# Patient Record
Sex: Female | Born: 1955 | Race: Black or African American | Hispanic: No | Marital: Married | Smoking: Former smoker
Health system: Southern US, Community
[De-identification: ages and names within clinical notes are randomized; demographics above are authoritative.]

## PROBLEM LIST (undated history)

## (undated) DIAGNOSIS — I2510 Atherosclerotic heart disease of native coronary artery without angina pectoris: Secondary | ICD-10-CM

## (undated) DIAGNOSIS — J45909 Unspecified asthma, uncomplicated: Secondary | ICD-10-CM

## (undated) DIAGNOSIS — I10 Essential (primary) hypertension: Secondary | ICD-10-CM

## (undated) DIAGNOSIS — J449 Chronic obstructive pulmonary disease, unspecified: Secondary | ICD-10-CM

## (undated) DIAGNOSIS — I6350 Cerebral infarction due to unspecified occlusion or stenosis of unspecified cerebral artery: Secondary | ICD-10-CM

## (undated) DIAGNOSIS — M1990 Unspecified osteoarthritis, unspecified site: Secondary | ICD-10-CM

## (undated) DIAGNOSIS — E119 Type 2 diabetes mellitus without complications: Secondary | ICD-10-CM

## (undated) DIAGNOSIS — N183 Chronic kidney disease, stage 3 (moderate): Secondary | ICD-10-CM

## (undated) DIAGNOSIS — I639 Cerebral infarction, unspecified: Secondary | ICD-10-CM

## (undated) DIAGNOSIS — I219 Acute myocardial infarction, unspecified: Secondary | ICD-10-CM

## (undated) DIAGNOSIS — F329 Major depressive disorder, single episode, unspecified: Secondary | ICD-10-CM

## (undated) DIAGNOSIS — E7800 Pure hypercholesterolemia, unspecified: Secondary | ICD-10-CM

## (undated) DIAGNOSIS — IMO0001 Reserved for inherently not codable concepts without codable children: Secondary | ICD-10-CM

## (undated) DIAGNOSIS — I509 Heart failure, unspecified: Secondary | ICD-10-CM

## (undated) HISTORY — PX: CARDIAC SURGERY: SHX584

## (undated) HISTORY — PX: FOOT SURGERY: SHX648

## (undated) HISTORY — DX: Chronic obstructive pulmonary disease, unspecified: J44.9

## (undated) HISTORY — DX: Type 2 diabetes mellitus without complications: E11.9

## (undated) HISTORY — DX: Atherosclerotic heart disease of native coronary artery without angina pectoris: I25.10

## (undated) HISTORY — DX: Essential (primary) hypertension: I10

## (undated) HISTORY — DX: Unspecified osteoarthritis, unspecified site: M19.90

## (undated) HISTORY — DX: Cerebral infarction due to unspecified occlusion or stenosis of unspecified cerebral artery: I63.50

## (undated) HISTORY — DX: Unspecified asthma, uncomplicated: J45.909

---

## 1997-07-28 ENCOUNTER — Inpatient Hospital Stay (HOSPITAL_COMMUNITY): Admission: EM | Admit: 1997-07-28 | Discharge: 1997-07-31 | Payer: Self-pay | Admitting: Emergency Medicine

## 1997-08-06 ENCOUNTER — Encounter: Admission: RE | Admit: 1997-08-06 | Discharge: 1997-08-06 | Payer: Self-pay | Admitting: Family Medicine

## 2006-03-06 DIAGNOSIS — I669 Occlusion and stenosis of unspecified cerebral artery: Secondary | ICD-10-CM | POA: Insufficient documentation

## 2006-03-06 HISTORY — DX: Occlusion and stenosis of unspecified cerebral artery: I66.9

## 2007-03-11 ENCOUNTER — Encounter: Payer: Self-pay | Admitting: Cardiovascular Disease

## 2007-03-12 ENCOUNTER — Other Ambulatory Visit: Payer: Self-pay | Admitting: Cardiology

## 2007-03-19 DIAGNOSIS — I252 Old myocardial infarction: Secondary | ICD-10-CM

## 2007-03-19 DIAGNOSIS — I2510 Atherosclerotic heart disease of native coronary artery without angina pectoris: Secondary | ICD-10-CM | POA: Insufficient documentation

## 2007-03-19 DIAGNOSIS — IMO0001 Reserved for inherently not codable concepts without codable children: Secondary | ICD-10-CM | POA: Insufficient documentation

## 2007-03-19 DIAGNOSIS — L0291 Cutaneous abscess, unspecified: Secondary | ICD-10-CM | POA: Insufficient documentation

## 2007-03-19 DIAGNOSIS — L0390 Cellulitis, unspecified: Secondary | ICD-10-CM | POA: Insufficient documentation

## 2007-03-19 DIAGNOSIS — I10 Essential (primary) hypertension: Secondary | ICD-10-CM | POA: Insufficient documentation

## 2007-03-19 DIAGNOSIS — J45909 Unspecified asthma, uncomplicated: Secondary | ICD-10-CM | POA: Insufficient documentation

## 2007-03-19 HISTORY — DX: Old myocardial infarction: I25.2

## 2008-11-24 ENCOUNTER — Encounter: Payer: Self-pay | Admitting: General Practice

## 2008-12-16 ENCOUNTER — Inpatient Hospital Stay
Admit: 2008-12-16 | Disposition: A | Discharge status: Home / Self Care | Payer: Self-pay | Source: Ambulatory Visit | Attending: Primary Care | Admitting: Primary Care

## 2008-12-16 ENCOUNTER — Encounter: Payer: Self-pay | Admitting: Cardiology

## 2008-12-30 DIAGNOSIS — J449 Chronic obstructive pulmonary disease, unspecified: Secondary | ICD-10-CM | POA: Insufficient documentation

## 2009-01-01 ENCOUNTER — Ambulatory Visit
Admit: 2009-01-01 | Discharge: 2009-01-01 | Disposition: A | Discharge status: Home / Self Care | Payer: Self-pay | Source: Ambulatory Visit | Attending: Cardiology | Admitting: Cardiology

## 2009-01-01 ENCOUNTER — Ambulatory Visit
Admit: 2009-01-01 | Discharge: 2009-01-01 | Disposition: A | Discharge status: Home / Self Care | Payer: Self-pay | Source: Ambulatory Visit | Admitting: Cardiology

## 2009-01-11 ENCOUNTER — Ambulatory Visit: Payer: Self-pay | Admitting: Internal Medicine

## 2009-01-18 ENCOUNTER — Emergency Department
Admit: 2009-01-18 | Disposition: A | Discharge status: Other Acute Inpatient Hospital | Payer: Self-pay | Source: Ambulatory Visit

## 2009-01-18 ENCOUNTER — Inpatient Hospital Stay
Admit: 2009-01-18 | Disposition: A | Discharge status: Home / Self Care | Payer: Self-pay | Source: Other Acute Inpatient Hospital | Attending: Internal Medicine | Admitting: Internal Medicine

## 2009-01-18 ENCOUNTER — Encounter: Payer: Self-pay | Admitting: Cardiology

## 2009-01-18 ENCOUNTER — Other Ambulatory Visit: Payer: Self-pay | Admitting: Gastroenterology

## 2009-01-18 LAB — CBC AND DIFFERENTIAL
Baso # K/uL: 0 THOU/uL (ref 0.0–0.1)
Basophil %: 0.1 % (ref 0.1–1.2)
Eos # K/uL: 0.1 THOU/uL (ref 0.0–0.4)
Eosinophil %: 1.5 % (ref 0.7–5.8)
Hematocrit: 33 % — ABNORMAL LOW (ref 34–45)
Hemoglobin: 10.5 g/dL — ABNORMAL LOW (ref 11.2–15.7)
Lymph # K/uL: 2.5 THOU/uL (ref 1.2–3.7)
Lymphocyte %: 35.9 % (ref 19.3–51.7)
MCV: 81 fL (ref 79–95)
Mono # K/uL: 0.6 THOU/uL (ref 0.2–0.9)
Monocyte %: 8.3 % (ref 4.7–12.5)
Neut # K/uL: 3.7 THOU/uL (ref 1.6–6.1)
Platelets: 236 THOU/uL (ref 160–370)
RBC: 4.1 MIL/uL (ref 3.9–5.2)
RDW: 17.9 % — ABNORMAL HIGH (ref 11.7–14.4)
Seg Neut %: 54.2 % (ref 34.0–71.1)
WBC: 6.9 THOU/uL (ref 4.0–10.0)

## 2009-01-18 LAB — PLASMA PROF 7 (ED ONLY)
Anion Gap,PL: 12 (ref 7–16)
CO2,Plasma: 23 mmol/L (ref 20–28)
Chloride,Plasma: 106 mmol/L (ref 96–108)
Creatinine: 1.19 mg/dL — ABNORMAL HIGH (ref 0.51–0.95)
GFR,Black: 58 * — AB
GFR,Caucasian: 48 * — AB
Glucose,Plasma: 190 mg/dL — ABNORMAL HIGH (ref 74–106)
Potassium,Plasma: 3.4 mmol/L (ref 3.4–4.7)
Sodium,Plasma: 141 mmol/L (ref 132–146)
UN,Plasma: 18 mg/dL (ref 6–20)

## 2009-01-18 LAB — HOLD RED: Hold Red: 1

## 2009-01-18 LAB — RUQ PANEL (ED ONLY)
ALT: 11 U/L (ref 0–35)
AST: 12 U/L (ref 0–35)
Albumin: 4 g/dL (ref 3.5–5.2)
Alk Phos: 80 U/L (ref 35–105)
Amylase: 24 U/L — ABNORMAL LOW (ref 28–100)
Bilirubin,Direct: <0.2 mg/dL (ref 0.0–0.3)
Bilirubin,Total: 0.1 mg/dL (ref 0.0–1.2)
Lipase: 48 U/L (ref 13–60)
Total Protein: 6.7 g/dL (ref 6.3–7.7)

## 2009-01-18 LAB — NT-PRO BNP: NT-pro BNP: 18 pg/mL (ref 0–900)

## 2009-01-18 LAB — HOLD GRAY

## 2009-01-18 LAB — TROPONIN T: Troponin T: <0.01 ng/mL (ref 0.00–0.02)

## 2009-01-18 LAB — HOLD BLUE

## 2009-01-19 LAB — BASIC METABOLIC PANEL
Anion Gap: 17 — ABNORMAL HIGH (ref 7–16)
CO2: 23 mmol/L (ref 20–28)
Calcium: 8.7 mg/dL (ref 8.6–10.2)
Chloride: 99 mmol/L (ref 96–108)
Creatinine: 1.21 mg/dL — ABNORMAL HIGH (ref 0.51–0.95)
GFR,Black: 57 * — AB
GFR,Caucasian: 47 * — AB
Glucose: 452 mg/dL — ABNORMAL HIGH (ref 74–106)
Lab: 21 mg/dL — ABNORMAL HIGH (ref 6–20)
Potassium: 4.8 mmol/L (ref 3.3–5.1)
Sodium: 139 mmol/L (ref 133–145)

## 2009-01-19 LAB — CBC AND DIFFERENTIAL
Baso # K/uL: 0 THOU/uL (ref 0.0–0.1)
Basophil %: 0.2 % (ref 0.1–1.2)
Eos # K/uL: 0 THOU/uL (ref 0.0–0.4)
Eosinophil %: 0 % — ABNORMAL LOW (ref 0.7–5.8)
Hematocrit: 32 % — ABNORMAL LOW (ref 34–45)
Hemoglobin: 10.1 g/dL — ABNORMAL LOW (ref 11.2–15.7)
Lymph # K/uL: 1.2 THOU/uL (ref 1.2–3.7)
Lymphocyte %: 19.4 % (ref 19.3–51.7)
MCV: 80 fL (ref 79–95)
Mono # K/uL: 0.1 THOU/uL — ABNORMAL LOW (ref 0.2–0.9)
Monocyte %: 1.5 % — ABNORMAL LOW (ref 4.7–12.5)
Neut # K/uL: 4.8 THOU/uL (ref 1.6–6.1)
Platelets: 207 THOU/uL (ref 160–370)
RBC: 4 MIL/uL (ref 3.9–5.2)
RDW: 17.7 % — ABNORMAL HIGH (ref 11.7–14.4)
Seg Neut %: 78.4 % — ABNORMAL HIGH (ref 34.0–71.1)
WBC: 6.1 THOU/uL (ref 4.0–10.0)

## 2009-01-19 LAB — URINALYSIS WITH MICROSCOPIC
Blood,UA: NEGATIVE
Glucose,UA: 1000 mg/dL — AB
Ketones, UA: NEGATIVE
Leuk Esterase,UA: NEGATIVE
Nitrite,UA: NEGATIVE
Protein,UA: NEGATIVE mg/dL
RBC,UA: NONE SEEN /HPF (ref 0–2)
Specific Gravity,UA: 1.027 (ref 1.001–1.030)
WBC,UA: NONE SEEN /HPF (ref 0–5)
pH,UA: 5 (ref 5.0–8.0)

## 2009-01-19 LAB — POCT GLUCOSE
Glucose POCT: >500 mg/dL — ABNORMAL HIGH (ref 74–106)
Glucose POCT: 500 mg/dL — ABNORMAL HIGH (ref 74–106)

## 2009-01-19 LAB — MRSA (ORSA) AMPLIFICATION

## 2009-01-20 LAB — POCT GLUCOSE
Glucose POCT: >500 mg/dL — ABNORMAL HIGH (ref 74–106)
Glucose POCT: >500 mg/dL — ABNORMAL HIGH (ref 74–106)
Glucose POCT: 494 mg/dL — ABNORMAL HIGH (ref 74–106)
Glucose POCT: >500 mg/dL — ABNORMAL HIGH (ref 74–106)
Glucose POCT: >500 mg/dL — ABNORMAL HIGH (ref 74–106)
Glucose POCT: 424 mg/dL — ABNORMAL HIGH (ref 74–106)
Glucose POCT: 359 mg/dL — ABNORMAL HIGH (ref 74–106)
Glucose POCT: 427 mg/dL — ABNORMAL HIGH (ref 74–106)
Glucose POCT: 456 mg/dL — ABNORMAL HIGH (ref 74–106)

## 2009-01-20 LAB — BASIC METABOLIC PANEL
Anion Gap: 13 (ref 7–16)
CO2: 24 mmol/L (ref 20–28)
Calcium: 9.2 mg/dL (ref 8.6–10.2)
Chloride: 101 mmol/L (ref 96–108)
Creatinine: 1.13 mg/dL — ABNORMAL HIGH (ref 0.51–0.95)
GFR,Black: >59 *
GFR,Caucasian: 51 * — AB
Glucose: 352 mg/dL — ABNORMAL HIGH (ref 74–106)
Lab: 26 mg/dL — ABNORMAL HIGH (ref 6–20)
Potassium: 4.7 mmol/L (ref 3.3–5.1)
Sodium: 138 mmol/L (ref 133–145)

## 2009-01-20 LAB — CBC AND DIFFERENTIAL
Baso # K/uL: 0 THOU/uL (ref 0.0–0.1)
Basophil %: 0.1 % (ref 0.1–1.2)
Eos # K/uL: 0 THOU/uL (ref 0.0–0.4)
Eosinophil %: 0 % — ABNORMAL LOW (ref 0.7–5.8)
Hematocrit: 32 % — ABNORMAL LOW (ref 34–45)
Hemoglobin: 10 g/dL — ABNORMAL LOW (ref 11.2–15.7)
Lymph # K/uL: 1.5 THOU/uL (ref 1.2–3.7)
Lymphocyte %: 13.9 % — ABNORMAL LOW (ref 19.3–51.7)
MCV: 79 fL (ref 79–95)
Mono # K/uL: 0.7 THOU/uL (ref 0.2–0.9)
Monocyte %: 6.7 % (ref 4.7–12.5)
Neut # K/uL: 8.5 THOU/uL — ABNORMAL HIGH (ref 1.6–6.1)
Platelets: 213 THOU/uL (ref 160–370)
RBC: 4 MIL/uL (ref 3.9–5.2)
RDW: 17.8 % — ABNORMAL HIGH (ref 11.7–14.4)
Seg Neut %: 78.8 % — ABNORMAL HIGH (ref 34.0–71.1)
WBC: 10.8 THOU/uL — ABNORMAL HIGH (ref 4.0–10.0)

## 2009-01-21 LAB — EKG 12-LEAD
P: 59 degrees
PR: 176 ms
QRS: 13 degrees
QRSD: 84 ms
QT: 404 ms
QTc: 472 ms
Rate: 82 {beats}/min
Severity: NORMAL
T: 54 degrees

## 2009-01-21 LAB — POCT GLUCOSE
Glucose POCT: 470 mg/dL — ABNORMAL HIGH (ref 74–106)
Glucose POCT: 230 mg/dL — ABNORMAL HIGH (ref 74–106)
Glucose POCT: 224 mg/dL — ABNORMAL HIGH (ref 74–106)
Glucose POCT: 250 mg/dL — ABNORMAL HIGH (ref 74–106)
Glucose POCT: 481 mg/dL — ABNORMAL HIGH (ref 74–106)
Glucose POCT: 431 mg/dL — ABNORMAL HIGH (ref 74–106)

## 2009-01-22 LAB — POCT GLUCOSE
Glucose POCT: 300 mg/dL — ABNORMAL HIGH (ref 74–106)
Glucose POCT: 275 mg/dL — ABNORMAL HIGH (ref 74–106)
Glucose POCT: 465 mg/dL — ABNORMAL HIGH (ref 74–106)
Glucose POCT: 454 mg/dL — ABNORMAL HIGH (ref 74–106)
Glucose POCT: 320 mg/dL — ABNORMAL HIGH (ref 74–106)
Glucose POCT: 292 mg/dL — ABNORMAL HIGH (ref 74–106)
Glucose POCT: 266 mg/dL — ABNORMAL HIGH (ref 74–106)
Glucose POCT: 229 mg/dL — ABNORMAL HIGH (ref 74–106)
Glucose POCT: 116 mg/dL — ABNORMAL HIGH (ref 74–106)
Glucose POCT: 159 mg/dL — ABNORMAL HIGH (ref 74–106)
Glucose POCT: 306 mg/dL — ABNORMAL HIGH (ref 74–106)

## 2009-01-23 LAB — POCT GLUCOSE: Glucose POCT: 185 mg/dL — ABNORMAL HIGH (ref 74–106)

## 2009-01-24 LAB — POCT GLUCOSE
Glucose POCT: 349 mg/dL — ABNORMAL HIGH (ref 74–106)
Glucose POCT: 259 mg/dL — ABNORMAL HIGH (ref 74–106)
Glucose POCT: 232 mg/dL — ABNORMAL HIGH (ref 74–106)
Glucose POCT: 312 mg/dL — ABNORMAL HIGH (ref 74–106)
Glucose POCT: 283 mg/dL — ABNORMAL HIGH (ref 74–106)
Glucose POCT: 224 mg/dL — ABNORMAL HIGH (ref 74–106)
Glucose POCT: 161 mg/dL — ABNORMAL HIGH (ref 74–106)

## 2009-01-24 LAB — COMPREHENSIVE METABOLIC PANEL
ALT: 15 U/L (ref 0–35)
AST: 14 U/L (ref 0–35)
Albumin: 4.2 g/dL (ref 3.5–5.2)
Alk Phos: 67 U/L (ref 35–105)
Anion Gap: 12 (ref 7–16)
Bilirubin,Total: 0.2 mg/dL (ref 0.0–1.2)
CO2: 29 mmol/L — ABNORMAL HIGH (ref 20–28)
Calcium: 9.5 mg/dL (ref 8.6–10.2)
Chloride: 100 mmol/L (ref 96–108)
Creatinine: 1.12 mg/dL — ABNORMAL HIGH (ref 0.51–0.95)
GFR,Black: >59 *
GFR,Caucasian: 51 * — AB
Globulin: 2.7 g/dL (ref 2.7–4.3)
Glucose: 52 mg/dL — ABNORMAL LOW (ref 74–106)
Lab: 21 mg/dL — ABNORMAL HIGH (ref 6–20)
Potassium: 4 mmol/L (ref 3.3–5.1)
Sodium: 141 mmol/L (ref 133–145)
Total Protein: 6.9 g/dL (ref 6.3–7.7)

## 2009-01-24 LAB — CBC AND DIFFERENTIAL
Baso # K/uL: 0.2 THOU/uL — ABNORMAL HIGH (ref 0.0–0.1)
Basophil %: 1 % (ref 0.1–1.2)
Eos # K/uL: 0 THOU/uL (ref 0.0–0.4)
Eosinophil %: 0 % — ABNORMAL LOW (ref 0.7–5.8)
Hematocrit: 34 % (ref 34–45)
Hemoglobin: 10.6 g/dL — ABNORMAL LOW (ref 11.2–15.7)
Lymph # K/uL: 7.5 THOU/uL — ABNORMAL HIGH (ref 1.2–3.7)
Lymphocyte %: 48 % (ref 19.3–51.7)
MCV: 80 fL (ref 79–95)
Mono # K/uL: 1.2 THOU/uL — ABNORMAL HIGH (ref 0.2–0.9)
Monocyte %: 8 % (ref 4.7–12.5)
Neut # K/uL: 6.5 THOU/uL — ABNORMAL HIGH (ref 1.6–6.1)
Nucl RBC %: 1 /100{WBCs} — ABNORMAL HIGH (ref 0.0–0.2)
Platelets: 252 THOU/uL (ref 160–370)
RBC: 4.3 MIL/uL (ref 3.9–5.2)
RDW: 18.9 % — ABNORMAL HIGH (ref 11.7–14.4)
Seg Neut %: 40 % (ref 34.0–71.1)
WBC: 15.5 THOU/uL — ABNORMAL HIGH (ref 4.0–10.0)

## 2009-01-24 LAB — BLOOD CULTURE: Bacterial Blood Culture: NO GROWTH

## 2009-01-24 LAB — BANDS: Bands %: 2 % (ref 0–10)

## 2009-01-24 LAB — SLIDE NUMBER: Slide # (Heme): 2131

## 2009-01-24 LAB — MAGNESIUM: Magnesium: 1.6 meq/L (ref 1.3–2.1)

## 2009-01-24 LAB — DIFF BASED ON: Diff Based On: 100 CELLS

## 2009-01-24 LAB — METAMYELOCYTE: Metamyelocyte %: 1 % (ref 0–1)

## 2009-01-24 LAB — POLYCHROMASIA

## 2009-01-24 LAB — MANUAL DIFFERENTIAL

## 2009-01-25 LAB — POCT GLUCOSE
Glucose POCT: 76 mg/dL (ref 74–106)
Glucose POCT: 145 mg/dL — ABNORMAL HIGH (ref 74–106)
Glucose POCT: 355 mg/dL — ABNORMAL HIGH (ref 74–106)
Glucose POCT: 188 mg/dL — ABNORMAL HIGH (ref 74–106)
Glucose POCT: 243 mg/dL — ABNORMAL HIGH (ref 74–106)
Glucose POCT: 226 mg/dL — ABNORMAL HIGH (ref 74–106)

## 2009-01-26 LAB — CBC AND DIFFERENTIAL
Baso # K/uL: 0 THOU/uL (ref 0.0–0.1)
Basophil %: 0.2 % (ref 0.1–1.2)
Eos # K/uL: 0 THOU/uL (ref 0.0–0.4)
Eosinophil %: 0 % — ABNORMAL LOW (ref 0.7–5.8)
Hematocrit: 32 % — ABNORMAL LOW (ref 34–45)
Hemoglobin: 9.9 g/dL — ABNORMAL LOW (ref 11.2–15.7)
Lymph # K/uL: 2 THOU/uL (ref 1.2–3.7)
Lymphocyte %: 19 % — ABNORMAL LOW (ref 19.3–51.7)
MCV: 81 fL (ref 79–95)
Mono # K/uL: 0.7 THOU/uL (ref 0.2–0.9)
Monocyte %: 6.1 % (ref 4.7–12.5)
Neut # K/uL: 7.7 THOU/uL — ABNORMAL HIGH (ref 1.6–6.1)
Platelets: 246 THOU/uL (ref 160–370)
RBC: 4 MIL/uL (ref 3.9–5.2)
RDW: 19.2 % — ABNORMAL HIGH (ref 11.7–14.4)
Seg Neut %: 72.3 % — ABNORMAL HIGH (ref 34.0–71.1)
WBC: 10.7 THOU/uL — ABNORMAL HIGH (ref 4.0–10.0)

## 2009-01-26 LAB — BASIC METABOLIC PANEL
Anion Gap: 11 (ref 7–16)
CO2: 28 mmol/L (ref 20–28)
Calcium: 9.3 mg/dL (ref 8.6–10.2)
Chloride: 95 mmol/L — ABNORMAL LOW (ref 96–108)
Creatinine: 1.36 mg/dL — ABNORMAL HIGH (ref 0.51–0.95)
GFR,Black: 49 * — AB
GFR,Caucasian: 41 * — AB
Glucose: 437 mg/dL — ABNORMAL HIGH (ref 74–106)
Lab: 30 mg/dL — ABNORMAL HIGH (ref 6–20)
Potassium: 5.6 mmol/L — ABNORMAL HIGH (ref 3.3–5.1)
Sodium: 134 mmol/L (ref 133–145)

## 2009-01-26 LAB — POCT GLUCOSE
Glucose POCT: 196 mg/dL — ABNORMAL HIGH (ref 74–106)
Glucose POCT: 280 mg/dL — ABNORMAL HIGH (ref 74–106)
Glucose POCT: 163 mg/dL — ABNORMAL HIGH (ref 74–106)
Glucose POCT: >500 mg/dL — ABNORMAL HIGH (ref 74–106)
Glucose POCT: >500 mg/dL — ABNORMAL HIGH (ref 74–106)
Glucose POCT: >500 mg/dL — ABNORMAL HIGH (ref 74–106)
Glucose POCT: 262 mg/dL — ABNORMAL HIGH (ref 74–106)
Glucose POCT: 145 mg/dL — ABNORMAL HIGH (ref 74–106)

## 2009-01-27 LAB — POCT GLUCOSE
Glucose POCT: 365 mg/dL — ABNORMAL HIGH (ref 74–106)
Glucose POCT: 375 mg/dL — ABNORMAL HIGH (ref 74–106)

## 2009-01-27 LAB — BASIC METABOLIC PANEL
Anion Gap: 8 (ref 7–16)
CO2: 32 mmol/L — ABNORMAL HIGH (ref 20–28)
Calcium: 9 mg/dL (ref 8.6–10.2)
Chloride: 99 mmol/L (ref 96–108)
Creatinine: 1.13 mg/dL — ABNORMAL HIGH (ref 0.51–0.95)
GFR,Black: >59 *
GFR,Caucasian: 51 * — AB
Glucose: 128 mg/dL — ABNORMAL HIGH (ref 74–106)
Lab: 23 mg/dL — ABNORMAL HIGH (ref 6–20)
Potassium: 4.1 mmol/L (ref 3.3–5.1)
Sodium: 139 mmol/L (ref 133–145)

## 2009-01-28 LAB — POCT GLUCOSE
Glucose POCT: 157 mg/dL — ABNORMAL HIGH (ref 74–106)
Glucose POCT: 212 mg/dL — ABNORMAL HIGH (ref 74–106)
Glucose POCT: 48 mg/dL — ABNORMAL LOW (ref 74–106)
Glucose POCT: 75 mg/dL (ref 74–106)
Glucose POCT: 159 mg/dL — ABNORMAL HIGH (ref 74–106)

## 2009-02-01 NOTE — Discharge Summary (Addendum)
INTERIM DISCHARGE SUMMARY:  Covering dates 01/23/2009 through 01/27/2009.      HOSPITAL COURSE:  The patient continued to have difficulty with wheezing  and was placed on Medrol taper with gradual improvement in her respiratory  status.  She was also found to have an elevated potassium level on  01/26/2009.  She was given Kayexalate x1 and lisinopril was discontinued.  On the morning of 01/27/2009, her potassium level was down to 4.1.  The  patient had difficulty with constipation and has been placed on a bowel  regimen.  She has completed her course of antibiotics.  Blood sugars are  under better control on her current regimen of Lantus 60 units subcu b.i.d.  and NovoLog 40 units subcu after meals.  The patient has been given a  requisition to have her blood work done, a CBC with diff, and SMA-8 on  02/03/2009 with the results to Dr. Lajuana Carry, her primary care physician for  review.  The patient has been instructed to check her blood sugars at  mealtimes and at bedtime and record for Dr. Lajuana Carry to review at her  followup.  The patient was noted to be noncompliant with her dietary  restrictions, frequently finding Snickers bars in her room that were being  provided by her significant other.  The patient continues to complain about  the use of Thick-it for her liquids.  She will need to thicken her liquids  at home to a honey consistency.  It appears that she has only been  intermittent compliant with this at home.  Risk of aspiration was discussed  with the patient.    DISCHARGE MEDICATIONS:     1. Amlodipine 10 mg p.o. daily.     2. Aspirin 325 p.o. daily.     3. Colace 200 mg p.o. b.i.d.     4. Combivent meter-dose inhaler 2 puffs q.i.d.     5. Cymbalta 60 mg p.o. daily.     6. NovoLog 40 units subcu after meals.     7. Lantus 60 units subcu b.i.d.     8. Lipitor 40 mg p.o. daily.     9. Medrol 16 mg p.o. daily x2 days, then 12 mg p.o. daily x2 days, then     8 mg p.o. daily x2 days, then 4 mg p.o. daily x2 days,  then discontinue.       10. Mucinex 600 mg p.o. b.i.d.     11. Lyrica 75 mg p.o. b.i.d.     12. Prilosec 20 mg p.o. daily.     13. Senna 2 tabs p.o. b.i.d.     14. Trazodone 200 mg p.o. q.h.s.     15. Triamterene/hydrochlorothiazide 1 tab p.o. daily.     16. Tylenol p.r.n.     17. Albuterol 2 puffs q.4h. p.r.n. shortness of breath or wheezing.     18. Robitussin p.r.n.    FOLLOWUP:  Follow up with Dr. Lajuana Carry within 1 week.          Dictated by:  Mortimer Fries, PA  Electronically Signed and Finalized by  Minta Balsam, MD 02/01/2009 14:49  ___________________________________________  Minta Balsam, MD  DD:  01/27/2009  DT:  01/27/2009  7:35 P  DVI: [id number]  [id number]    cc:  Lyman Bishop, MD

## 2009-02-03 ENCOUNTER — Institutional Professional Consult (permissible substitution): Payer: Self-pay | Admitting: Sleep Medicine

## 2009-02-04 ENCOUNTER — Ambulatory Visit: Payer: Self-pay

## 2009-02-05 ENCOUNTER — Ambulatory Visit
Admit: 2009-02-05 | Discharge: 2009-02-05 | Disposition: A | Discharge status: Home / Self Care | Payer: Self-pay | Source: Ambulatory Visit | Attending: Cardiology | Admitting: Cardiology

## 2009-02-05 ENCOUNTER — Inpatient Hospital Stay: Admit: 2009-02-05 | Discharge: 2009-02-05 | Disposition: A | Discharge status: Home / Self Care | Payer: Self-pay

## 2009-02-05 ENCOUNTER — Ambulatory Visit: Payer: Self-pay | Admitting: Cardiology

## 2009-02-09 ENCOUNTER — Ambulatory Visit
Admit: 2009-02-09 | Discharge: 2009-02-09 | Disposition: A | Discharge status: Home / Self Care | Payer: Self-pay | Source: Ambulatory Visit | Attending: Family | Admitting: Family

## 2009-02-09 ENCOUNTER — Ambulatory Visit: Payer: Self-pay | Admitting: Family

## 2009-02-09 LAB — COMPREHENSIVE METABOLIC PANEL
ALT: 15 U/L (ref 0–35)
AST: 11 U/L (ref 0–35)
Albumin: 4.3 g/dL (ref 3.5–5.2)
Alk Phos: 77 U/L (ref 35–105)
Anion Gap: 13 (ref 7–16)
Bilirubin,Total: 0.2 mg/dL (ref 0.0–1.2)
CO2: 24 mmol/L (ref 20–28)
Calcium: 9.2 mg/dL (ref 8.6–10.2)
Chloride: 101 mmol/L (ref 96–108)
Creatinine: 1.43 mg/dL — ABNORMAL HIGH (ref 0.51–0.95)
GFR,Black: 47 * — AB
GFR,Caucasian: 39 * — AB
Glucose: 339 mg/dL — ABNORMAL HIGH (ref 74–106)
Lab: 20 mg/dL (ref 6–20)
Potassium: 4.1 mmol/L (ref 3.3–5.1)
Sodium: 138 mmol/L (ref 133–145)
Total Protein: 7 g/dL (ref 6.3–7.7)

## 2009-02-09 LAB — LACTATE, PLASMA: Lactate: 2.6 mmol/L — ABNORMAL HIGH (ref 0.5–2.2)

## 2009-02-09 LAB — HOLD LAVENDER

## 2009-02-16 ENCOUNTER — Ambulatory Visit: Payer: Self-pay | Admitting: Family

## 2009-02-17 ENCOUNTER — Observation Stay
Admit: 2009-02-17 | Disposition: A | Discharge status: Home / Self Care | Payer: Self-pay | Source: Ambulatory Visit | Attending: Geriatric Medicine | Admitting: Geriatric Medicine

## 2009-02-17 ENCOUNTER — Encounter: Payer: Self-pay | Admitting: Gastroenterology

## 2009-02-17 LAB — CBC AND DIFFERENTIAL
Baso # K/uL: 0 THOU/uL (ref 0.0–0.1)
Basophil %: 0.3 % (ref 0.1–1.2)
Eos # K/uL: 0.1 THOU/uL (ref 0.0–0.4)
Eosinophil %: 1.9 % (ref 0.7–5.8)
Hematocrit: 36 % (ref 34–45)
Hemoglobin: 11.1 g/dL — ABNORMAL LOW (ref 11.2–15.7)
Lymph # K/uL: 2.5 THOU/uL (ref 1.2–3.7)
Lymphocyte %: 36 % (ref 19.3–51.7)
MCV: 84 fL (ref 79–95)
Mono # K/uL: 0.6 THOU/uL (ref 0.2–0.9)
Monocyte %: 9.1 % (ref 4.7–12.5)
Neut # K/uL: 3.7 THOU/uL (ref 1.6–6.1)
Platelets: 323 THOU/uL (ref 160–370)
RBC: 4.3 MIL/uL (ref 3.9–5.2)
RDW: 19.5 % — ABNORMAL HIGH (ref 11.7–14.4)
Seg Neut %: 52.7 % (ref 34.0–71.1)
WBC: 6.9 THOU/uL (ref 4.0–10.0)

## 2009-02-17 LAB — PLASMA PROF 7 (ED ONLY)
Anion Gap,PL: 11 (ref 7–16)
CO2,Plasma: 22 mmol/L (ref 20–28)
Chloride,Plasma: 108 mmol/L (ref 96–108)
Creatinine: 0.79 mg/dL (ref 0.51–0.95)
GFR,Black: >59 *
GFR,Caucasian: >59 *
Glucose,Plasma: 185 mg/dL — ABNORMAL HIGH (ref 74–106)
Potassium,Plasma: 4.1 mmol/L (ref 3.4–4.7)
Sodium,Plasma: 141 mmol/L (ref 132–146)
UN,Plasma: 19 mg/dL (ref 6–20)

## 2009-02-17 LAB — HOLD BLUE

## 2009-02-17 LAB — POCT GLUCOSE: Glucose POCT: 369 mg/dL — ABNORMAL HIGH (ref 74–106)

## 2009-02-17 LAB — HOLD SST

## 2009-02-17 LAB — HOLD GRAY

## 2009-02-17 LAB — HOLD RED: Hold Red: 1

## 2009-02-17 NOTE — ED Provider Notes (Signed)
VISIT NUMBER:  [id number].    CHIEF COMPLAINT:  Breathing difficulty.    HPI:  In early November, the patient went to the hospital for 8 to 9 days  for COPD exacerbation and was discharged shortly after Thanksgiving.  She  comes in with 24 hours of significant dyspnea, dyspnea on exertion, dyspnea  at rest, and lightheadedness all refractory to albuterol.  It is not  worsened lying down.  No fevers.  She did take a course of antibiotics in  November.  She has the above dyspnea, shortness of breath, productive  cough, thin fluids, no mental status changes, neck stiffness, abdominal  pain, nausea, vomiting, or diarrhea.    ALLERGIES: see prior cis note    MEDICATIONS:  see prior cis note    PAST MEDICAL / SURGICAL HISTORY:     1. Heart disease with stent.     2. Hypertension.     3. Asthma.     4. COPD.     5. CVA.     6. Diabetes.    FAMILY HISTORY:  Asthma.    SOCIAL HISTORY:  No tobacco, alcohol, or drug use.  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  She has daughters and grandchildren.    REVIEW OF SYSTEMS:  Positive for the above; otherwise, negative x10.    PHYSICAL EXAMINATION:  Vital signs:  35.7, 24, 82, 123/88, 94% on room air.  General:  Patient is alert, oriented, in no distress, appearing stated age.    Head:  Normocephalic, atraumatic.  Eyes:  Pupils equal to light, reactive.  Extraocular movements intact.  Nonicteric.  Oropharynx:  Clear.  Neck:  Supple with no jugular venous distention visible and no  meningismus.  Pulmonary:  The patient is speaking in very short phrases.  Lungs:  Very  distant breath sounds, very difficult to hear, but they are wheezing.  Heart:  S1, S2 with no murmurs, rubs, gallops or heaves.  Abdomen:  Soft, nondistended, nontender, no masses, no guarding, no  rigidity.  Extremities:  Warm and perfused.  Moves all extremities well with no edema.    Neurologic:  Symmetric motor and sensory examination.  Psychiatric:  Alert and oriented.  Skin:  No acute rashes, wounds or  petechiae.    MEDICAL DECISION MAKING/CONDITION:        DDX:        PLAN:    DATA:        LABS:  Chem-7 is normal.  White cell count is normal.        RADIOLOGIC STUDIES:  Chest x-ray shows no pneumonia.        ECG:        RHYTHM STRIPS:    PROCEDURE (Attestation):  CRITICAL CARE TIME (Time to perform separately billable procedures  subtracted from CC time):  CONSULT:  PCP NOTIFIED:  SMOKING CESSATION COUNSELING:    FOLLOW-UP NOTE AND DISPOSITION:  The patient has significant dyspnea  consistent with that which led her to an 8-day stay prior.  She has visible  breathing difficulty.  She has audible wheezing.  This is quite consistent  with her prior COPD exacerbation.  I do not think that a diagnosis such as  PE work-up is indicated.  A white cell count with a hematocrit is  acceptable ruling out any anemia as a contribution.  Her chest x-ray shows  no pneumonia.    We will be putting her on albuterol, steroids, and antibiotics for any  bacterial contribution.  We anticipate transferring her to [place] for the  above.    ED DIAGNOSIS:  Chronic obstructive pulmonary disease exacerbation.                                        Electronically Signed and Finalized  by  Danie Binder, MD 02/23/2009 00:12  ___________________________________________  Danie Binder, MD      DD:   02/17/2009  DT:   02/17/2009  7:23 P  [id number]  [id number]    cc:   Lyman Bishop, MD

## 2009-02-18 LAB — MRSA (ORSA) AMPLIFICATION: MRSA (ORSA) Amplification: NOT DETECTED

## 2009-02-18 LAB — POCT GLUCOSE
Glucose POCT: 466 mg/dL — ABNORMAL HIGH (ref 74–106)
Glucose POCT: 254 mg/dL — ABNORMAL HIGH (ref 74–106)
Glucose POCT: 167 mg/dL — ABNORMAL HIGH (ref 74–106)

## 2009-02-18 NOTE — Progress Notes (Addendum)
HPI   CC: arrives requesting oxycodone for h/a, she is also in for f/u of   hypokalemia she is Diabetic poorly controlled recently discharged from [facility]   was admitted d/t astham, hyperglycemia...has hx of dysphagia d/t   stroke...recently moved from [facility]...arrives with a bag of medications is not   certain what medicaztions her vns is putting in her box her significant   other is also reporting he is giving her medications... patient has   recently relocated back to [place] to be near her daughter      DM bg/g on arrival 43 c/o frontal h/a (not the worse h/a ever had) has not   taken her insulin today is c/o polyuria poydipsia last a1c 11.5      HTN states she is confused what medications she is taking the nurse put   them in the box but is unsure "what she is actually taking with   changes"...denies tobacco (but smells heavily of tobacco)     GERD denies s/s today but has had     Asthma/copd/sob  c/o cough and some wheezing for past 2 days no fever or   chills.  Active Problems   Asthma With Acute Exacerbation (493.92)  Cellulitis (682.9)  Chronic Obstructive Pulmonary Disease (496)  Coronary Artery Disease (414.00)  Diabetes Mellitus (250.00)  Hypertension (401.9)  Ischemic Stroke 2008 (434.90)  Prior Myocardial Infarction.  Current Meds   GlipiZIDE 10 MG Tablet Extended Release 24 Hour;TAKE 2 TABLET DAILY; RPT  Aspirin 325 MG Tablet;TAKE 1 TABLET DAILY.; RPT  Atenolol 25 MG Tablet;TAKE 1 TABLET DAILY.; Rx  Lyrica 100 MG Capsule;TAKE 1 CAPSULE TWICE DAILY.; Rx  Cymbalta 60 MG Capsule Delayed Release Particles;TAKE 1 CAPSULE DAILY; Rx  MetFORMIN HCl 1000 MG Tablet;TAKE 1 TABLET TWICE DAILY WITH MEALS.; Rx  Januvia 100 MG Tablet;TAKE 1 TABLET DAILY.; Rx  Zolpidem Tartrate 10 MG Tablet;TAKE 1 TABLET DAILY AT BEDTIME.; Rx  Lisinopril-Hydrochlorothiazide 10-12.5 MG Tablet;TAKE 1 TABLET DAILY.; Rx  Lipitor 40 MG Tablet;TAKE 1 TABLET DAILY.; Rx  TraZODone HCl 100 MG Tablet;TAKE 1 TABLET BEDTIME; Rx  AmLODIPine Besylate 10  MG Tablet;TAKE 1 TABLET DAILY.; Rx  Omeprazole 20 MG Capsule Delayed Release;TAKE 1 CAPSULE DAILY.; Rx  Lantus 100 UNIT/ML Solution;INJECT 80 UNIT BEDTIME; Rx  NovoLOG 100 UNIT/ML Solution;INJECT  30 UNITS SUBCUTANEOUSLY WITH MEALS AS   DIRECTED.  DO NOT TAKE IF NOT EATING MEAL; Rx  Albuterol Sulfate (2.5 MG/3ML) 0.083% Nebulization Solution;USE 1 UNIT DOSE   IN NEBULIZER 4 TIMES DAILY; Rx  Non-medication order(s);bayer contour stripssig: use as directed; Rx  Non-medication order(s);bayer countour lancets; Rx  Advair Diskus 500-50 MCG/DOSE MISC;USE 1 INHALATION EVERY 12 HOURS. RINSE   MOUTH AFTER USE.; Rx  Combivent 18-103 MCG/ACT Aerosol;INHALE 2 PUFFS 4 TIMES DAILY AND AS   NEEDED. DO NOT EXCEED 12 PUFFS IN 24 HOURS.; Rx.  1-Medication Reconciliation;Medications Reconciled; Qty0; R0; RPT.  Allergies   Keppra TABS  Latex-asked/denied  Lisinopril TABS; Hyperkalemia.  Latex Screen   --Patient denies an allergy to latex or rubber products.  --Patient denies a reaction such as hives, swelling, or difficulty   breathing after contact with rubber products.  --Patient denies that they are currently on latex precautions.  **If yes to any of the above, institute latex precautions.**.  Vital Signs   Recorded by Erasmo Leventhal on 09 Feb 2009 12:58 PM  BP:122/84,   HR: 78 b/min,   Resp: 21 r/min, Wheezing,   Temp: 36.6 C,   Pain Scale: 10,  O2 Sat: 97 (%SpO2),  RA.  ROS   CONSTITUTIONAL: Appetite good, no fevers, night sweats or weight loss  CV: No chest pain, shortness of breath or peripheral edema  RESPIRATORY: + cough, +wheezing  dyspnea  GI: No nausea/vomiting, abdominal pain, or change in bowel habits  GU: No dysuria, urgency or incontinence  NEURO: No MS changes, no motor weakness, no sensory changes c/o h/a.  Physical Exam   --GENERAL APPEARANCE: Appears chronically ill, fat affect AAandO x3   --PERRL, EOMI, no nystagmus no papiedema anicteric   --LUNGS: scattered I/E wheezes (smells heavily of tobacco)   --HEART: Normal  S1,S2no m/g/r  --ABDOMEN: +BS, soft, non-tender, without hepato-splenomegaly  --EXTREMITIES: Without clubbing, cyanosis, or edema  --NEUROLOGIC: Alert and oriented x3.  Health Mgmt Plan   Eye Exam every 1 year; for Diabetes Mellitus; Overdue.  Quality Metrics   A blood sugar level by fingerstick 444 mg/dl  Results   U/A DIP (CHEMSTRIP 10 + SSA)   10 Feb 2009 04:35 PM  -   SPECIFIC GRAVITY,UR: 1.005   -   PH,UR: 6   -   LEUK ESTERASE,UR: neg  -   NITRITES,UR: neg  -   PROTEIN,UR: neg  -   GLUCOSE,UR: 1000  -   KETONES,UR: neg  -   UROBILINOGEN,UR: norm  -   BILIRUBIN,UR: neg  -   BLOOD,UR: neg.  Orders   Renew AmLODIPine Besylate 10 MG Tablet;TAKE 1 TABLET DAILY; Qty30; R0; Rx.  Plan   53 y/o with recent admission to [facility] for Asthma/COPD, HX for HTN and DM   arrives for f/u on arrival she is with several c/o first is cough and   wheezing started last PM, Her b/g > 400 on arrival  (reports she has not   taken her insulin since yesterday no reason given).Marland KitchenMarland KitchenPatient was given an   emergency appointment today for check of her potassium ??? patient is   accompanied by her significant other who is reporting he has been giving   her medications from the pill bottles the patient states the Nurse filled   the boxes  for her...as of today I have used her discharge instructions to   reconcile her medications however will need to speak with the CHN to   discuss what medications she is actually taking      Asthma COPD poorly controlled I suspect in part d/t non adhearence with   regular regimen O2 sat 97% RA she is with fine bilateral wheezes no rales   significant improvement with one albuterol/ Atrovent nebulizer   --continue with Inhalers as directed will hold off with prednisone taper at   this time   --RTO for worsening s/s asthma  --advair 250 bid added to regimen  will most likely add spiriva next visit   --cease tobacco or exposure to second hand tobacco  has declined assistance   today   --CXR clear      DM 2 there is much  confusion over medications in her bag of medications are   Januvia, metformin, Glyburide she does have pill boxes with medications in   them some of which have fallen out in bag Her Cr today is 1.4   --have directed to hold all oral agents at this time  --directed to take Insulin daily discharge instructions report lantus at 60   units bid I have directed her to continue with the 40 units bid (that she   has been taking)  her Novolog will  remain at the 40 units pre meal  (not as directed by [facility]   they had directed her to use after meals) patient also directed not touse   if not eating   --call office for b/g < 90 > 400  --directed to check her b/g 3 times daily until her insulin regimen is   adjusted adequately      HTN  will continue with the lisinopril /HCTZ  --d/c triamterene/ HCTZ   --no salt diet     S/P stoke has residual dysphagia currently not having difficulty with   swallowing clearing secretions well      copy of her current medication list given to patient she has been directed   to give to her VNS.  Signature   Electronically signed by: Acquanetta Chain  FNP-BC; 02/17/2009 8:15 PM EST.

## 2009-02-19 LAB — POCT GLUCOSE
Glucose POCT: 455 mg/dL — ABNORMAL HIGH (ref 74–106)
Glucose POCT: 445 mg/dL — ABNORMAL HIGH (ref 74–106)
Glucose POCT: >500 mg/dL — ABNORMAL HIGH (ref 74–106)
Glucose POCT: 334 mg/dL — ABNORMAL HIGH (ref 74–106)
Glucose POCT: 212 mg/dL — ABNORMAL HIGH (ref 74–106)

## 2009-02-19 LAB — MRSA (ORSA) AMPLIFICATION: MRSA (ORSA) Amplification: NOT DETECTED

## 2009-02-20 LAB — POCT GLUCOSE
Glucose POCT: 270 mg/dL — ABNORMAL HIGH (ref 74–106)
Glucose POCT: 426 mg/dL — ABNORMAL HIGH (ref 74–106)
Glucose POCT: 466 mg/dL — ABNORMAL HIGH (ref 74–106)
Glucose POCT: >500 mg/dL — ABNORMAL HIGH (ref 74–106)
Glucose POCT: 359 mg/dL — ABNORMAL HIGH (ref 74–106)

## 2009-02-20 LAB — GLUCOSE: Glucose: 391 mg/dL — ABNORMAL HIGH (ref 74–106)

## 2009-02-20 NOTE — ED Provider Notes (Signed)
FOLLOW-UP NOTE:    VISIT NUMBER: [id number]    ADMISSION DATE:  February 17, 2009.    The patient is seen on February 20, 2009, at 0915.    Observation stay course includes:  The patient was placed in the  Observation Unit following an Emergency Department admission for shortness  of breath and wheezing.  Her observation course included nebulizer  treatments, prednisone, symptom monitoring with ambulatory O2 saturation  and peak flows, blood glucose monitoring, and some pain management.    PHYSICAL EXAM:  General appearance:  The patient is a comfortable-appearing 53 year old  female in no acute distress.  HEENT:  Atraumatic. Hearing, speech, and swallow are all within normal  limits.  Pulmonary:  Lungs are diminished throughout with faint scattered wheezes.  The patient's peak flow was 280.  Cardiovascular:  Regular rate and rhythm.  S1, S2.  No pedal edema.  Abdomen:  Soft, nontender, and nondistended.  Bowel sounds are intact.  Musculoskeletal:  She is moving all extremities, ambulatory.  Neurologic:  Alert and oriented x3.  No acute distress.    The patient was able to carry on conversation while ambulating.  O2 was 93%  to 95% with ambulation and a heart rate of 100.  She was not noted to be  dyspneic. She showered, tolerated PO intake, and ambulated freely prior to  discharge.    DATA  LABS:  WBC 6.9, hemoglobin 11.1, hematocrit 36, platelets 323.  Chemistry:  Sodium 141, potassium 4.1, chloride 108, CO2 22, BUN 19, creatinine 0.79.  Blood glucose ranged between 167.  Critical high by fingerstick was  evaluated via serum at 391.  RADIOLOGIC STUDIES:  The patient had a chest x-ray on December 15:  No  acute disease; small right pleural effusion versus pleural thickening.  ECG:  None.  Cardiac testing:  None.    CONSULT:  None.  SMOKING CESSATION COUNSELING:  None.  The patient is a nonsmoker.    FINAL DIAGNOSIS:  Chronic obstructive pulmonary disease exacerbation.    The patient was discharged at about 12:20 on  February 20, 2009, with  improved symptoms.    FOLLOW-UP WITH:  Directed to follow up with Dr. Lajuana Carry as planned for  Monday on December 20.    MEDICATION CHANGES:  No medication changes.    PRESCRIPTIONS GIVEN:  She was given a prescription for prednisone taper and  a small course of Percocet for pain.                Dictated by:  Salena Saner, NP  Electronically Signed and Finalized by  Salena Saner, NP 02/20/2009 17:10  ___________________________________________  Salena Saner, NP  DD:   02/20/2009  DT:   02/20/2009  3:12 P  [id number]/[id number]  [id number]    cc:   Lyman Bishop, MD

## 2009-02-22 ENCOUNTER — Ambulatory Visit: Payer: Self-pay | Admitting: Internal Medicine

## 2009-02-23 ENCOUNTER — Inpatient Hospital Stay
Admit: 2009-02-23 | Disposition: A | Discharge status: Home Health | Payer: Self-pay | Source: Other Acute Inpatient Hospital | Attending: Primary Care | Admitting: Primary Care

## 2009-02-23 ENCOUNTER — Encounter: Payer: Self-pay | Admitting: Cardiology

## 2009-02-23 ENCOUNTER — Other Ambulatory Visit: Payer: Self-pay | Admitting: Gastroenterology

## 2009-02-23 ENCOUNTER — Emergency Department
Admit: 2009-02-23 | Disposition: A | Discharge status: Other Acute Inpatient Hospital | Payer: Self-pay | Source: Ambulatory Visit

## 2009-02-23 LAB — DRUGS OF ABUSE SCRN,URINE
Amphetamine,UR: NEGATIVE
Barbiturate,UR: NEGATIVE
Benzodiazepinen,UR: NEGATIVE
Cocaine/Metab,UR: NEGATIVE
Opiates,UR: POSITIVE — AB
PCP,UR: NEGATIVE
THC Metabolite,UR: NEGATIVE
Tricyclics,UR: NEGATIVE

## 2009-02-23 LAB — CBC AND DIFFERENTIAL
Baso # K/uL: 0 THOU/uL (ref 0.0–0.1)
Basophil %: 0.2 % (ref 0.1–1.2)
Eos # K/uL: 0.1 THOU/uL (ref 0.0–0.4)
Eosinophil %: 0.8 % (ref 0.7–5.8)
Hematocrit: 34 % (ref 34–45)
Hemoglobin: 10.3 g/dL — ABNORMAL LOW (ref 11.2–15.7)
Lymph # K/uL: 1.5 THOU/uL (ref 1.2–3.7)
Lymphocyte %: 24.1 % (ref 19.3–51.7)
MCV: 82 fL (ref 79–95)
Mono # K/uL: 0.5 THOU/uL (ref 0.2–0.9)
Monocyte %: 8.4 % (ref 4.7–12.5)
Neut # K/uL: 4.1 THOU/uL (ref 1.6–6.1)
Platelets: 285 THOU/uL (ref 160–370)
RBC: 4.1 MIL/uL (ref 3.9–5.2)
RDW: 19.1 % — ABNORMAL HIGH (ref 11.7–14.4)
Seg Neut %: 66.5 % (ref 34.0–71.1)
WBC: 6.2 THOU/uL (ref 4.0–10.0)

## 2009-02-23 LAB — PLASMA PROF 7 (ED ONLY)
Anion Gap,PL: 11 (ref 7–16)
CO2,Plasma: 24 mmol/L (ref 20–28)
Chloride,Plasma: 103 mmol/L (ref 96–108)
Creatinine: 0.76 mg/dL (ref 0.51–0.95)
GFR,Black: >59 *
GFR,Caucasian: >59 *
Glucose,Plasma: 223 mg/dL — ABNORMAL HIGH (ref 74–106)
Potassium,Plasma: 3.5 mmol/L (ref 3.4–4.7)
Sodium,Plasma: 138 mmol/L (ref 132–146)
UN,Plasma: 22 mg/dL — ABNORMAL HIGH (ref 6–20)

## 2009-02-23 LAB — CK ISOENZYMES
CK: 79 U/L (ref 34–145)
Mass CKMB: 2.4 ng/mL (ref 0.0–2.9)

## 2009-02-23 LAB — BLOOD CULTURE: Bacterial Blood Culture: NO GROWTH

## 2009-02-23 LAB — TROPONIN T
Troponin T: <0.01 ng/mL (ref 0.00–0.02)
Troponin T: <0.01 ng/mL (ref 0.00–0.02)

## 2009-02-23 LAB — GLUCOSE
Glucose: 539 mg/dL (ref 74–106)
Glucose: 440 mg/dL — ABNORMAL HIGH (ref 74–106)

## 2009-02-23 LAB — HOLD BLUE

## 2009-02-23 NOTE — ED Provider Notes (Signed)
VISIT NUMBER:  [id number]    DATE OF ED EVALUATION:  February 23, 2009    TIME OF ED ATTENDING EVALUATION:   10:51 a.m.    I saw and evaluated the patient.  I agree with the resident's/fellow's  finding and plan of care as documented.  Details of my evaluation are as  follows:    CHIEF COMPLAINT:  "My asthma."    HPI:  A 53 year old female with a past medical history significant for  COPD, asthma, diabetes, coronary artery disease, hypertension, presents to  the emergency department complaining of worsening shortness of breath and  feeling that she cannot get the air in.  The patient states that she was  recently seen in the ED but was unable to fill her medications,  specifically her prednisone and is in a smoky house and feels that she  could not breathe and presents here to the emergency department for further  evaluation.  The patient is a vague and poor historian but denies any fever  history, admits to some chest discomfort, which she cannot further qualify,  and states that it is difficult for her to breathe.  The patient currently  denies headache, vision changes.  The patient admitted to a headache  earlier today but states it is currently gone.  She denies vision changes,  neck pain, current chest pain, palpitations, vertigo lightheadedness,  passing out; admits to a minor cough, nonproductive in nature.  Denies  abdominal pain, nausea, vomiting, diarrhea, UTI symptoms, GI bleed, history  symptoms, skin symptoms, or neurologic symptoms.    ALLERGIES:  Keppra and lisinopril, which cause hyperkalemia.  MEDICATIONS:  Per the [employer] Snapshot dated February 22, 2009, in the  ED chart.    PAST MEDICAL / SURGICAL HISTORY:  Per the [employer] Snapshot dated  February 22, 2009, in the ED chart.    FAMILY HISTORY:  Significant for diabetes and hypertension.    SOCIAL HISTORY:  The patient states that she has not smoked in a few days  and states there is secondary smoke in the house.  Denies alcohol or  drug  abuse.  [place] in [place], with social supports in  place.    REVIEW OF SYSTEMS:  Per HPI.  Other systems reviewed and negative.    PHYSICAL EXAMINATION:  Vital signs at triage include:  Temperature:  36.1.  Respiratory rate:  22.  Pulse:  78.  Blood pressure:  165/96.  Saturation:  Initially satting 83% on room air, now 96% on 3L nasal  cannula.  General appearance:  A 53 year old female who is slightly uncomfortable,  GCS 15, alert and oriented, conversant in 3 to 4-word sentences with  positive purse-lipped breathing.  HEENT:  Skull is intact.  The skull is nontender without depression. Pupils  are equal, round and reactive to light bilaterally.  There is no scleral  icterus.  Extraocular movements are intact.  Orbits are intact.  Oropharynx  is intact and patent.  Neck is supple without appreciable JVD.  There are  no meningeal findings.  Lungs:  Prolonged expiratory phase with expiratory wheezes throughout.  Bilateral breath sounds with moderate aeration.  Cardiovascular:  Rate  regular rhythm.  No murmurs, rubs or gallops are auscultated.  Patient is  well perfused with 2+ pulses in all four extremities with capillary refill  less than two seconds throughout.  There is no appreciable peripheral  edema.  Abdomen:  Normal bowel sounds, nontender, nondistended.  There are no  masses  or peritoneal findings on examination.  Extremities:  All nontender without deformity or DVT findings.  Skin:  Warm and perfused with no rash, wounds or other lesions.  Neurologic:  Cranial nerves 2 through 12 are intact.  Motor is 5/5 in all  extremities.    MEDICAL DECISION MAKING/CONDITION:  This is a 53 year old female who  presents to the emergency department with what appears to be an acute COPD  exacerbation; however, given her chest pain and her cardiac history, the  patient will be evaluated for acute coronary syndrome.  Also, the patient  will be evaluated in the emergency department, telemetry  monitoring,  12-lead ECG, CBC, electrolytes, troponin, aspirin therapy, continuous  reassessments, and blood cultures.  The patient will also receive treatment  for her COPD in terms of nebulizer therapy, corticosteroid therapy, and  continuous reassessments.  If the patient has no significant pathology on  her ED evaluation, the patient will likely require admission, given her  hypoxia secondary to her worsening COPD that she has not treated.  Please  refer to [facility] Medical Record 400-P for followup and  ultimate disposition.        DDX:        PLAN:    DATA:        LABS:        RADIOLOGIC STUDIES:        ECG:        RHYTHM STRIPS:    PROCEDURE (Attestation):  CRITICAL CARE TIME (Time to perform separately billable procedures  subtracted from CC time):  CONSULT:  PCP NOTIFIED:  SMOKING CESSATION COUNSELING:    FOLLOW-UP NOTE AND DISPOSITION:    ED DIAGNOSIS:  Acute chronic obstructive pulmonary disease exacerbation,  chest pain.                                Electronically Signed and Finalized  by  Junius Finner, MD 03/01/2009 22:10  ___________________________________________  Junius Finner, MD      DD:   02/23/2009  DT:   02/23/2009 11:24 A  [id number]/[id number]  [id number]    cc:   Lyman Bishop, MD

## 2009-02-24 LAB — POCT GLUCOSE
Glucose POCT: >500 mg/dL — ABNORMAL HIGH (ref 74–106)
Glucose POCT: >500 mg/dL — ABNORMAL HIGH (ref 74–106)
Glucose POCT: >500 mg/dL — ABNORMAL HIGH (ref 74–106)
Glucose POCT: >500 mg/dL — ABNORMAL HIGH (ref 74–106)
Glucose POCT: 447 mg/dL — ABNORMAL HIGH (ref 74–106)
Glucose POCT: 323 mg/dL — ABNORMAL HIGH (ref 74–106)

## 2009-02-24 LAB — TROPONIN T: Troponin T: <0.01 ng/mL (ref 0.00–0.02)

## 2009-02-25 LAB — BASIC METABOLIC PANEL
Anion Gap: 11 (ref 7–16)
CO2: 28 mmol/L (ref 20–28)
Calcium: 9.1 mg/dL (ref 8.6–10.2)
Chloride: 98 mmol/L (ref 96–108)
Creatinine: 0.81 mg/dL (ref 0.51–0.95)
GFR,Black: >59 *
GFR,Caucasian: >59 *
Glucose: 370 mg/dL — ABNORMAL HIGH (ref 74–106)
Lab: 22 mg/dL — ABNORMAL HIGH (ref 6–20)
Potassium: 4.6 mmol/L (ref 3.3–5.1)
Sodium: 137 mmol/L (ref 133–145)

## 2009-02-25 LAB — POCT GLUCOSE
Glucose POCT: 306 mg/dL — ABNORMAL HIGH (ref 74–106)
Glucose POCT: 277 mg/dL — ABNORMAL HIGH (ref 74–106)
Glucose POCT: 231 mg/dL — ABNORMAL HIGH (ref 74–106)
Glucose POCT: 428 mg/dL — ABNORMAL HIGH (ref 74–106)
Glucose POCT: 476 mg/dL — ABNORMAL HIGH (ref 74–106)
Glucose POCT: 480 mg/dL — ABNORMAL HIGH (ref 74–106)
Glucose POCT: 421 mg/dL — ABNORMAL HIGH (ref 74–106)
Glucose POCT: 299 mg/dL — ABNORMAL HIGH (ref 74–106)
Glucose POCT: 444 mg/dL — ABNORMAL HIGH (ref 74–106)

## 2009-02-25 LAB — CBC
Hematocrit: 33 % — ABNORMAL LOW (ref 34–45)
Hemoglobin: 10.4 g/dL — ABNORMAL LOW (ref 11.2–15.7)
MCV: 81 fL (ref 79–95)
Platelets: 298 THOU/uL (ref 160–370)
RBC: 4.1 MIL/uL (ref 3.9–5.2)
RDW: 18.9 % — ABNORMAL HIGH (ref 11.7–14.4)
WBC: 7.3 THOU/uL (ref 4.0–10.0)

## 2009-02-26 LAB — POCT GLUCOSE
Glucose POCT: 385 mg/dL — ABNORMAL HIGH (ref 74–106)
Glucose POCT: 321 mg/dL — ABNORMAL HIGH (ref 74–106)
Glucose POCT: 495 mg/dL — ABNORMAL HIGH (ref 74–106)

## 2009-02-28 NOTE — Discharge Summary (Addendum)
ADMISSION DIAGNOSIS:  Chronic obstructive pulmonary disease exacerbation.    DISCHARGE DIAGNOSIS:  Chronic obstructive pulmonary disease exacerbation.    CHIEF COMPLAINT:  Shortness of breath.    HISTORY OF PRESENT ILLNESS:  The patient is a 53 year old female with a  past medical history significant for COPD, asthma, diabetes, coronary  artery disease, and hypertension who presents to the ED complaining of  1-day history of increased shortness of breath.  The patient had recently  been admitted to observation on 12/18 with plans for an extended prednisone  taper; unfortunately, she did not pick up her prednisone prescription until  12/20.  The patient reports increasing shortness of breath and the  sensation of chest tightness.  She has a chronic cough productive of  clear/white sputum that is unchanged.  She denies any fever, sinus  congestion, rhinorrhea, or postnasal drip.    The patient endorses "allover headache" that had previously been treated  with Percocet.  The patient had been discharged with a brief course of  Percocet from observation that she has been taking regularly.    PAST MEDICAL HISTORY:     1. COPD.     2. Coronary artery disease, status post PCI.     3. Uncontrolled diabetes type 2.     4. Hypertension.     5. History of CVA with residual dysphagia.     6. Dyslipidemia.    FAMILY HISTORY:  Significant for coronary artery disease, diabetes, and  stroke in her mother.    SOCIAL HISTORY:  The patient has recently moved to [place] and lives with  her significant other.  She continues to smoke several cigarettes per day.  Denies alcohol or illicit drug use, though, has a history of cocaine use.    REVIEW OF SYSTEMS:  Negative except that pertaining to the HPI.    HOME MEDICATIONS:     1. Advair 500/50 b.i.d.     2. Albuterol 2 puffs q.4h. p.r.n.     3. Albuterol nebulizers q.4h.     4. Ambien 10 mg q.h.s.     5. Amlodipine 10 mg daily.     6. Aspirin 325 mg daily.     7. Atenolol 25 mg daily.      8. Colace 100 mg b.i.d.     9. Cymbalta 60 mg daily.     10. NovoLog 5 units subcu q.a.c.     11. Lantus 40 units subcu b.i.d.     12. Lipitor 40 mg daily.     13. Lisinopril/hydrochlorothiazide 10/12.5 mg.     14. Lyrica 100 mg b.i.d.     15. Mucinex 600/30 b.i.d.     16. Naproxen 500 mg b.i.d. p.r.n.     17. Prednisone taper presently at 50 mg.     18. Prilosec 20 mg daily.     19. Trazodone 100 mg q.h.s.    PHYSICAL EXAMINATION:  Temperature 36.3.  Pulse 80.  Blood pressure 140/90.  Respirations 18.  Sating 95% on room air.  General:  The patient was well  appearing, in no acute distress, interactive and conversant, without  evident shortness of breath.  Able to speak in full sentences without  difficulty.  HEENT:  Normocephalic and atraumatic.  EOMI.  Mucous membranes  moist.  Neck:  Supple.  Cardiovascular:  S1 and S2.  Regular rate and  rhythm.  Lungs:  Diminished breath sounds throughout with mild scattered  wheeze, left greater than right.  Abdomen:  Obese.  Positive  bowel sounds.  Soft, nontender, nondistended.  Extremities:  1+ pedal pulses.  No edema.  Neurologic:  The patient was alert and moving all extremities  spontaneously.    LABORATORY DATA:  CBC with white blood cell count 6.2, hemoglobin 10.3,  hematocrit 34, platelets 285.  Chemistry:  Sodium 138, potassium 3.5,  chloride 103, bicarb 24, BUN 22, creatinine 0.76, glucose 223.  Troponin  less than 0.01.    SUMMARY:  This is a 53 year old female with COPD, diabetes, coronary artery  disease, and history of CVA with recent observation admission for COPD  exacerbation, presenting with continued COPD exacerbation likely due to  noncompliance of prednisone taper.       1. The patient was started on Solu-Medrol 20 mg IV b.i.d. with     improvement in her symptoms.  She continued on Symbicort as a  therapeutic interchange for Advair during her admission; she received  DuoNeb around the clock with p.r.n. albuterol.  The patient is to be  transitioned to  Medrol on discharge.     2. Cardiovascular:  The patient remained hemodynamically stable during     her admission.  Her troponins were negative x3.  She was continued on     aspirin, simvastatin, lisinopril/hydrochlorothiazide, atenolol, and     amlodipine for hypertension.     3. Diabetes:  The patient continued to have uncontrolled diabetes during     her admission; though, her steroids likely were contributing to     hyperglycemia.  She was continued on Lantus 48 units b.i.d.  She was     unclear what her home NovoLog dose was; though, her outpatient records     indicate that she takes 5 units of NovoLog q.a.c.; NovoLog was increased  to 10 units q.a.c. during her admission with correction of her  hyperglycemia, so she will be discharged on her home dose to be further  adjusted by her PCP.     4. Pain:  The patient complained of nonspecific headache during her     admission.  This was felt to be most likely secondary to rebound from     her recent Percocet use versus drug seeking.  She did not receive     narcotics during her admission and was continued on naproxen 500 mg     b.i.d. as needed, as well as Tylenol.  The patient also was continued on     Lyrica 100 mg b.i.d. per her outpatient regimen.     5. Depression:  The patient was continued on Cymbalta for her outpatient  regimen.    CONDITION ON DISCHARGE:  Stable.    DISCHARGE MEDICATIONS:     1. Advair 500/50 b.i.d.     2. Albuterol nebulizers q.4h.     3. Amlodipine 10 mg daily.     4. Aspirin 325 mg daily.     5. Atenolol 25 mg daily.     6. Colace 100 mg b.i.d.     7. Cymbalta 60 mg daily.     8. NovoLog 5 units subcu q.a.c.     9. Lantus 40 units b.i.d.     10. Lipitor 40 mg daily.     11. Lisinopril/hydrochlorothiazide 10/12.5 mg daily.     12. Lyrica 100 mg b.i.d.     13. Medrol 4 mg daily.     14. Mucinex 600/30 b.i.d.     15. Prilosec 20 mg daily.     16. Trazodone 100 mg q.h.s.  17. Albuterol 2 puffs q.4h. p.r.n. shortness of breath.     18.  Ambien 10 mg q.h.s. p.r.n.     19. Naproxen 500 mg b.i.d. p.r.n.    DISCHARGE INSTRUCTIONS:  The patient is to complete a brief Medrol taper on  discharge; 16 mg 12/25, 12 mg 12/26, 8 mg 12/27, and 4 mg 12/28.    The patient underwent swallow evaluation during her admission and should  continue to drink only honey-thickened liquids upon discharge.  She should  follow up with her PCP, Dr. Lyman Bishop, on 12/29 at 3 p.m.              Dictated by:  Rich Brave, MD,RES  Electronically Signed and Finalized by  Minta Balsam, MD 03/08/2009 09:52  ___________________________________________  Minta Balsam, MD  DD: 02/27/2009  DT: 02/28/2009 12:24 A  DVI: [id number]  [id number]    cc:  Lyman Bishop, MD

## 2009-03-03 ENCOUNTER — Ambulatory Visit: Payer: Self-pay | Admitting: Internal Medicine

## 2009-03-03 NOTE — Progress Notes (Signed)
Reason For Visit   CC:ED follow-up for COPD exacerbation     MEDICATIONS/CHART REVIEWEDdone at time of visit, list provided to patient     SUBJECTIVE:patient is here today with her significant other for a follow-up   visit after being evaluated in the emergency department at [facility].  She was diagnosed with COPD exacerbation and started on a   prednisone taper.  She has 3 more days left on the taper.  She is feeling   much better.  patient admits his neck consistent with her medications.     OBJECTIVE:  GENERAL:  AandOX3  EARS:  TM'S intact, non-injected, canals clear bilaterally.  THROAT:  non-injected  NECK:   supple, no significant adenopathy  LUNGS:  CTA  HEART:  RRR, normal S1, S2 no murmur  SKIN:      no rashes     ASSESSMENT:COPD exacerbation     PLAN:  1.  Encouraged patient to take prescription medications as prescribed  2.  Finish prednisone taper  3.  Office visit here one month for routine follow-up  4.  If not better, worse or any problems call p.r.n.  Active Problems   Asthma With Acute Exacerbation (493.92)  Cellulitis (682.9)  Chronic Obstructive Pulmonary Disease (496)  Coronary Artery Disease (414.00)  Diabetes Mellitus (250.00)  Hypertension (401.9)  Ischemic Stroke 2008 (434.90)  Prior Myocardial Infarction.  Current Meds   Non-medication order(s);bayer countour lancets; Rx  Non-medication order(s);bayer contour stripssig: use as directed; Rx  Aspirin 325 MG Tablet;TAKE 1 TABLET DAILY.; RPT  Atenolol 25 MG Tablet;TAKE 1 TABLET DAILY.; Rx  Lyrica 100 MG Capsule;TAKE 1 CAPSULE TWICE DAILY.; Rx  Cymbalta 60 MG Capsule Delayed Release Particles;TAKE 1 CAPSULE DAILY; Rx  Omeprazole 20 MG Capsule Delayed Release;TAKE 1 CAPSULE DAILY.; Rx  TraZODone HCl 100 MG Tablet;TAKE 1 TABLET BEDTIME; Rx  Lipitor 40 MG Tablet;TAKE 1 TABLET DAILY.; Rx  Advair Diskus 500-50 MCG/DOSE Aerosol Powder Breath Activated;ONE   INHALATION TWICE A DAY; Rx  Docusate Sodium 100 MG Capsule;TAKE 1 CAPSULE TWICE DAILY  hold for loose   stools; Rx  Mucinex 600 MG Tablet Extended Release 12 Hour;TAKE 1 TABLET TWICE DAILY   PRN cough; Rx  Lantus 100 UNIT/ML Solution;INJECT 40 UNITS TWICE DAILY; Rx  Albuterol Sulfate (2.5 MG/3ML) 0.083% Nebulization Solution;albuterol neb   inh one unit dose X1 now with atrovent,; Rx  Lisinopril-Hydrochlorothiazide 10-12.5 MG Tablet;TAKE 1 TABLET DAILY.; RPT  1-Medication Reconciliation;Medications Reconciled; RPT  AmLODIPine Besylate 10 MG Tablet;TAKE 1 TABLET DAILY.; Rx  Non-medication order(s);One touch ultra test strips.  Dx: Diabetes; Rx  NovoLOG 100 UNIT/ML Solution;INJECT  40  UNITS SUBCUTANEOUSLY WITH MEALS AS   DIRECTED.  DO NOT TAKE IF NOT EATING MEAL; Rx  NovoLOG FlexPen 100 UNIT/ML Solution;INJECT 5 UNIT 3 TIMES DAILY 15 minues   prior to meal; Rx  Lantus SoloStar 100 UNIT/ML Solution;INJECT 40 UNIT TWICE DAILY; Rx  Naproxen 500 MG Tablet;TAKE 1 TABLET BY MOUTH TWO TIMES A DAY WITH MEALS; Rx  Zolpidem Tartrate 10 MG Tablet;TAKE 1 TABLET DAILY AT BEDTIME.; Rx  Ventolin HFA 108 (90 Base) MCG/ACT Aerosol Solution;INHALE 2 PUFFS EVERY 4   HOURS PRN wheezing/SOB; Rx wheezing/SOB.  Allergies   Keppra TABS  Latex-asked/denied  Lisinopril TABS; Hyperkalemia.  Latex Screen   --Patient denies an allergy to latex or rubber products.  --Patient denies a reaction such as hives, swelling, or difficulty   breathing after contact with rubber products.  --Patient denies that they are currently  on latex precautions.  **If yes to any of the above, institute latex precautions.**.  Vital Signs   Recorded by yramirez on 03 Mar 2009 02:38 PM  Temp: 37.3 C,   Weight: 99.5 kg,   Pain Scale: 3.  Health Mgmt Plan   Eye Exam every 1 year; for Diabetes Mellitus; Overdue.  Results   Blood Pressure   03 Mar 2009 03:28 PM  -   Systolic: 134 mm Hg  -   Diastolic: 82 mm Hg.  Signature   Electronically signed by: Ernst Breach  N.P.; 03/03/2009 4:14 PM EST.

## 2009-03-10 ENCOUNTER — Ambulatory Visit: Payer: Self-pay | Admitting: Family

## 2009-03-17 ENCOUNTER — Institutional Professional Consult (permissible substitution): Payer: Self-pay | Admitting: Sleep Medicine

## 2009-03-19 LAB — EKG 12-LEAD
P: 62 degrees
PR: 164 ms
QRS: 10 degrees
QRSD: 88 ms
QT: 432 ms
QTc: 470 ms
Rate: 71 {beats}/min
Severity: BORDERLINE
Statement: BORDERLINE
T: 42 degrees

## 2009-03-23 ENCOUNTER — Ambulatory Visit: Payer: Self-pay | Admitting: Internal Medicine

## 2009-04-04 ENCOUNTER — Inpatient Hospital Stay
Admit: 2009-04-04 | Disposition: A | Discharge status: Home / Self Care | Payer: Self-pay | Source: Ambulatory Visit | Attending: Internal Medicine | Admitting: Internal Medicine

## 2009-04-04 LAB — PROTIME-INR
INR: 1.1 (ref 0.9–1.1)
Protime: 14.3 s (ref 11.9–14.7)

## 2009-04-04 LAB — URINALYSIS REFLEX TO CULTURE
Blood,UA: NEGATIVE
Glucose,UA: 50 mg/dL — AB
Leuk Esterase,UA: NEGATIVE
Nitrite,UA: NEGATIVE
Protein,UA: NEGATIVE mg/dL

## 2009-04-04 LAB — CBC AND DIFFERENTIAL
Baso # K/uL: 0 THOU/uL (ref 0.0–0.1)
Basophil %: 0.5 % (ref 0.1–1.2)
Eos # K/uL: 0.1 THOU/uL (ref 0.0–0.4)
Eosinophil %: 0.9 % (ref 0.7–5.8)
Hematocrit: 36 % (ref 34–45)
Hemoglobin: 11.5 g/dL (ref 11.2–15.7)
Lymph # K/uL: 2.7 THOU/uL (ref 1.2–3.7)
Lymphocyte %: 33.9 % (ref 19.3–51.7)
MCV: 81 fL (ref 79–95)
Mono # K/uL: 0.7 THOU/uL (ref 0.2–0.9)
Monocyte %: 8.2 % (ref 4.7–12.5)
Neut # K/uL: 4.5 THOU/uL (ref 1.6–6.1)
Platelets: 258 THOU/uL (ref 160–370)
RBC: 4.5 MIL/uL (ref 3.9–5.2)
RDW: 17 % — ABNORMAL HIGH (ref 11.7–14.4)
Seg Neut %: 55.9 % (ref 34.0–71.1)
WBC: 8.1 THOU/uL (ref 4.0–10.0)

## 2009-04-04 LAB — COMPREHENSIVE METABOLIC PANEL
ALT: 10 U/L (ref 0–35)
AST: 12 U/L (ref 0–35)
Albumin: 3.9 g/dL (ref 3.5–5.2)
Alk Phos: 76 U/L (ref 35–105)
Anion Gap: 14 (ref 7–16)
Bilirubin,Total: 0.2 mg/dL (ref 0.0–1.2)
CO2: 27 mmol/L (ref 20–28)
Calcium: 9 mg/dL (ref 8.6–10.2)
Chloride: 101 mmol/L (ref 96–108)
Creatinine: 0.85 mg/dL (ref 0.51–0.95)
GFR,Black: >59 *
GFR,Caucasian: >59 *
Globulin: 2.8 g/dL (ref 2.7–4.3)
Glucose: 140 mg/dL — ABNORMAL HIGH (ref 74–106)
Lab: 17 mg/dL (ref 6–20)
Potassium: 3.7 mmol/L (ref 3.3–5.1)
Sodium: 142 mmol/L (ref 133–145)
Total Protein: 6.7 g/dL (ref 6.3–7.7)

## 2009-04-04 LAB — TROPONIN T: Troponin T: <0.01 ng/mL (ref 0.00–0.02)

## 2009-04-04 LAB — GLUCOSE: Glucose: 450 mg/dL — ABNORMAL HIGH (ref 74–106)

## 2009-04-04 LAB — BLOOD BANK HOLD PINK

## 2009-04-04 LAB — URINALYSIS WITH REFLEX TO CULTURE
Specific Gravity,UA: 1.027 (ref 1.001–1.030)
pH,UA: 5 (ref 5.0–8.0)

## 2009-04-04 LAB — HOLD RED

## 2009-04-04 LAB — CK ISOENZYMES
CK: 216 U/L — ABNORMAL HIGH (ref 34–145)
Mass CKMB: 3.9 ng/mL — ABNORMAL HIGH (ref 0.0–2.9)
Relative Index: 1.8 % (ref 0.0–5.0)

## 2009-04-04 LAB — APTT: aPTT: 32.6 s (ref 22.3–35.3)

## 2009-04-05 ENCOUNTER — Ambulatory Visit: Payer: Self-pay | Admitting: Internal Medicine

## 2009-04-05 LAB — POCT GLUCOSE
Glucose POCT: >500 mg/dL — ABNORMAL HIGH (ref 74–106)
Glucose POCT: >500 mg/dL — ABNORMAL HIGH (ref 74–106)
Glucose POCT: 420 mg/dL — ABNORMAL HIGH (ref 74–106)
Glucose POCT: 401 mg/dL — ABNORMAL HIGH (ref 74–106)

## 2009-04-05 NOTE — Discharge Summary (Addendum)
 DISCHARGE DIAGNOSES:     1. Chronic obstructive pulmonary disease with acute exacerbation.     2. Acute respiratory distress secondary to aspiration secondary to     chronic oropharyngeal dysphagia.     3. Diabetes mellitus type 2, uncontrolled secondary to steroids.     4. Hypertension, controlled.    CHIEF COMPLAINT:  Wheezing, shortness of breath.    HISTORY OF PRESENT ILLNESS:  The patient is a 54 year old woman admitted  with 1-2 days of worsening dyspnea and wheezing.  Her symptoms started on  awakening and have been triggered by reflux or choking in the past.  Her  symptoms became worse yesterday.  She tried to ambulate as little as  possible to prevent worsening of her symptoms.  Despite having shortness of  breath, it does not appear that she is using her nebulizer more frequently  at about a rate of 2 times per day.  She is only taking Advair once daily,  as this should be a b.i.d. dosing.  She denies fever or chills.  She has  chronic headaches.  She also had chest tightness on arrival which resolved  with nebulizer treatments.  In the ED she received ceftriaxone,  azithromycin, 125 mg of IV Solu-Medrol, and several nebulizer treatments.  The next morning she felt better and asked to go home.    PAST MEDICAL HISTORY:     1. COPD.     2. Coronary artery disease, status post inferior myocardial infarction.       3. Diabetes mellitus type 2.     4. Hypertension.     5. History of CVA with residual dysphagia.     6. Dyslipidemia.    ALLERGIES:  Keppra.    MEDICATIONS ON ADMISSION:     1. Aspirin 325 mg once daily.     2. Atenolol 25 mg once daily.     3. Lyrica 100 mg b.i.d.     4. Cymbalta 60 mg daily.     5. Omeprazole 20 mg daily.     6. Advair 500/50 once daily.     7. Albuterol q.i.d. and p.r.n.     8. Amlodipine 10 mg once daily.     9. Colace b.i.d.     10. Aspart 5 units q.a.c.     11. Glargine 40 units subcu b.i.d.     12. Lipitor 40 mg once daily.     13. Lisinopril/hydrochlorothiazide 10/12.5 mg  once daily.     14. Mucinex DM b.i.d.     15. Ambien q.h.s.     16. Naproxen p.r.n.     17. Trazodone 100 mg q.h.s.    FAMILY HISTORY:  The patient reports that her mother had a history of CVA,  coronary artery disease, and diabetes mellitus.    SOCIAL HISTORY:  The patient currently lives with her daughter.  Her  significant other lives in [place].  She moved to [place] within  the last year.  She is functionally illiterate.  She quit smoking last  year.  She denies alcohol or recreational drug use.    REVIEW OF SYSTEMS:  Positive for headache and urinary frequency.  The  patient denies cough and denies palpitations.    PHYSICAL EXAMINATION ON ADMISSION:  Blood pressure 136/67.  Heart rate 105.  Respiratory rate 21.  Oxygen saturation 95% on room air.  General:  The  patient appears dyspneic after walking to the bathroom.  She speaks 5 words  in between breaths.  After rest, the patient breathes comfortable on room  air.  HEENT:  JVP is not elevated. The pupils are equally round and  reactive to light.  Extraocular movements are intact.  Moist mucous  membranes.  Cardiovascular:  Tachycardic, regular rhythm.  No murmurs  appreciated.  Chest:  No wheezing appreciated.  Inadequate air exchange but  lungs are clear.  Abdomen:  Soft, nontender, nondistended.  Good bowel  sounds appreciated.  Extremities:  No edema.  Extremities warm and well  perfused.  Neurologic:  The patient is alert and oriented x3.  She has no  focal deficits on exam.    LABORATORY DATA ON ADMISSION:  The patient's CBC and complete metabolic  panel are unremarkable.  Troponin is negative.  Coagulation studies are  negative.  Chest x-ray:  There is good expansion.  No focal infiltrates  appreciated.    SUMMARY OF HOSPITAL COURSE:     1. COPD exacerbation:  The patient was continued on IV solumedrol 20 mg  b.i.d.  She was given moxifloxacin, nebulized bronchodilators, and  Symbicort.  The morning after admission, the patient said she felt  much  better and was ambulating with a room air oxygen saturation of 96%.  Her  air movement improved and she had upper airway wheezing and mild stridor,  but no dyspnea.  The episode may have been triggered by reflux or  aspiration.  She was discharged on a rapid steroid taper, starting with 30  mg of prednisone, taper to 20 in 1 day, then taper to 10 mg the following  day, and then tapered off steroids.  At this time, it appears prudent to  start the patient on Spiriva, given her multiple admissions for COPD  exacerbations.  It was explained to the patient that the Advair should be  used on a b.i.d. basis, and she has her nebulizer machine at home if  needed.     2. Diabetes mellitus:  The patient's blood sugars are poorly controlled,  with a HbA1c of 11.5 in October 2010.  Blood sugar rose over 400 with IV  steroids.  She was continued on Lantus 40 units b.i.d.  and Novolog  insulin.  She was unable to  sliding scale insulin, so it was explained to  her that she is to take 10 units before each meal in addition to her Lantus  twice a day.    DISCHARGE INSTRUCTIONS:  The patient was discharged in the care of her  daughter.  Her PCP, Dr. Lajuana Carry', office was contacted, and they will  contact the patient with a followup appointment for this week.    DISCHARGE MEDICATIONS:     1. Advair 500/50 mcg inhaled b.i.d.     2. Albuterol inhaled q.4h.     3. Amlodipine 10 mg orally once daily.     4. Aspirin 325 mg once daily.     5. Atenolol 25 mg once daily.     6. Colace 100 mg b.i.d.     7. Cymbalta 60 mg daily.     8. Insulin Aspart 10 units subcutaneous before meals.     9. Insulin glargine 40 units subcu b.i.d.     10. Lipitor 40 mg once daily.     11. Lisinopril/hydrochlorothiazide 10 mg/12.5 mg once daily.     12. Lyrica 100 mg b.i.d.     13. Mucinex DM oral b.i.d.     14. Prednisone taper as laid out in the summary of hospital course.  15. Prilosec 20 mg orally once daily.     16. Spiriva 18 mcg 1 puff inhaled  once daily.     17. Trazodone 100 mg q.h.s.     18. Albuterol p.r.n.     19. Ambien 10 mg orally q.h.s. p.r.n.     20. Naproxen 500 mg orally b.i.d. p.r.n.        Dictated by:  Darliss Cheney, MD,RES  Electronically Signed and Finalized by  Elberta Spaniel, MD 04/06/2009 09:19  ___________________________________________  Elberta Spaniel, MD  DD:  04/05/2009  DT:  04/05/2009  7:53 P  DVI: [id number]  GN/[id number]    cc:  Lyman Bishop, MD       Elberta Spaniel, MD

## 2009-04-06 LAB — POCT GLUCOSE
Glucose POCT: 370 mg/dL — ABNORMAL HIGH (ref 74–106)
Glucose POCT: 50 mg/dL — ABNORMAL LOW (ref 74–106)
Glucose POCT: 52 mg/dL — ABNORMAL LOW (ref 74–106)

## 2009-04-10 LAB — BLOOD CULTURE
Bacterial Blood Culture: NO GROWTH
Bacterial Blood Culture: NO GROWTH

## 2009-04-16 ENCOUNTER — Ambulatory Visit: Payer: Self-pay | Admitting: Ophthalmology

## 2009-04-20 ENCOUNTER — Institutional Professional Consult (permissible substitution): Payer: Self-pay | Admitting: Sleep Medicine

## 2009-04-21 ENCOUNTER — Ambulatory Visit: Payer: Self-pay | Admitting: Ophthalmology

## 2009-08-03 ENCOUNTER — Ambulatory Visit: Admit: 2009-08-03 | Payer: Self-pay | Source: Ambulatory Visit | Admitting: Cardiology

## 2009-08-06 ENCOUNTER — Ambulatory Visit: Payer: Self-pay | Admitting: Cardiology

## 2009-08-06 ENCOUNTER — Other Ambulatory Visit: Payer: Self-pay

## 2010-03-15 NOTE — Miscellaneous (Unsigned)
 Continuity of Care Record  Created: todo  From: KOUIDES, RUTH  From:   From: [employer], EHR v10.2.7.53  To: [name]  Purpose: Patient Use;       Problems  Diagnosis: Asthma With Acute Exacerbation (493.92)   Diagnosis: Cellulitis (682.9)   Diagnosis: Chronic Obstructive Pulmonary Disease (496)   Diagnosis: Coronary Artery Disease (414.00)   Diagnosis: Diabetes Mellitus (250.00)   Diagnosis: Hypertension (401.9)   Problem: Prior Myocardial Infarction  Diagnosis: Ischemic Stroke 2008 (434.90)     Alerts  Allergy - Keppra TABS   Allergy - Latex-asked/denied   Allergy - Lisinopril TABS Hyperkalemia     Medications  1-Medication Reconciliation; Medications Reconciled ; RPT   Advair Diskus 500-50 MCG/DOSE Aerosol Powder Breath Activated; ONE   INHALATION TWICE A DAY ; Rx   Albuterol Sulfate (2.5 MG/3ML) 0.083% Nebulization Solution; albuterol neb   inh one unit dose X1 now with atrovent, ; Rx   AmLODIPine Besylate 10 MG Tablet; TAKE 1 TABLET DAILY. ; Rx   Aspirin 325 MG Tablet; TAKE 1 TABLET DAILY. ; Rx   Atenolol 25 MG Tablet; TAKE 1 TABLET DAILY. ; Rx   Cymbalta 60 MG Capsule Delayed Release Particles; TAKE 1 CAPSULE DAILY ; Rx   Docusate Sodium 100 MG Capsule; TAKE 1 CAPSULE TWICE DAILY hold for loose   stools ; Rx   Ipratropium-Albuterol 0.5-2.5 (3) MG/3ML Solution; USE 1 UNIT DOSE IN   NEBULIZER EVERY 4 HOURS AS NEEDED. ; Rx   Lantus 100 UNIT/ML Solution; INJECT 40 UNITS TWICE DAILY ; Rx   Lantus SoloStar 100 UNIT/ML Solution; INJECT 40 UNIT TWICE DAILY ; Rx   Lipitor 40 MG Tablet; TAKE 1 TABLET DAILY. ; Rx   Lisinopril-Hydrochlorothiazide 10-12.5 MG Tablet; TAKE 1 TABLET DAILY. ; Rx   Lyrica 100 MG Capsule; TAKE 1 CAPSULE TWICE DAILY. ; Rx   Mucinex 600 MG Tablet Extended Release 12 Hour; TAKE 1 TABLET TWICE DAILY   PRN cough ; Rx   Naproxen 500 MG Tablet; TAKE 1 TABLET BY MOUTH TWO TIMES A DAY WITH MEALS ;   Rx   Non-medication order(s); One touch ultra test strips.  Dx:  Diabetes ; Rx   Non-medication order(s); bayer countour lancets ; Rx   Non-medication order(s); bayer contour stripssig: use as directed ; Rx   NovoLOG 100 UNIT/ML Solution; INJECT  40  UNITS SUBCUTANEOUSLY WITH MEALS   AS DIRECTED.  DO NOT TAKE IF NOT EATING MEAL ; Rx   NovoLOG FlexPen 100 UNIT/ML Solution; INJECT 5 UNIT 3 TIMES DAILY 15 minues   prior to meal ; Rx   Omeprazole 20 MG Capsule Delayed Release; TAKE 1 CAPSULE DAILY. ; Rx   Thick-It Powder; USE AS DIRECTED. ; Rx   TraZODone HCl 100 MG Tablet; TAKE 1 TABLET BEDTIME ; Rx   Ventolin HFA 108 (90 Base) MCG/ACT Aerosol Solution; INHALE 2 PUFFS EVERY 4   HOURS PRN wheezing/SOB ; Rx   Zolpidem Tartrate 10 MG Tablet; TAKE 1 TABLET DAILY AT BEDTIME. ; Rx

## 2010-09-23 ENCOUNTER — Encounter: Payer: Self-pay | Admitting: Emergency Medicine

## 2010-09-23 ENCOUNTER — Other Ambulatory Visit: Payer: Self-pay | Admitting: Gastroenterology

## 2010-09-23 LAB — CBC AND DIFFERENTIAL
Baso # K/uL: 0 10*3/uL (ref 0.0–0.1)
Basophil %: 0.4 % (ref 0.1–1.2)
Eos # K/uL: 0.1 10*3/uL (ref 0.0–0.4)
Eosinophil %: 1.6 % (ref 0.7–5.8)
Hematocrit: 35 % (ref 34–45)
Hemoglobin: 11 g/dL — ABNORMAL LOW (ref 11.2–15.7)
Lymph # K/uL: 2.8 10*3/uL (ref 1.2–3.7)
Lymphocyte %: 40.8 % (ref 19.3–51.7)
MCV: 81 fL (ref 79–95)
Mono # K/uL: 0.5 10*3/uL (ref 0.2–0.9)
Monocyte %: 6.8 % (ref 4.7–12.5)
Neut # K/uL: 3.5 10*3/uL (ref 1.6–6.1)
Platelets: 231 10*3/uL (ref 160–370)
RBC: 4.3 MIL/uL (ref 3.9–5.2)
RDW: 16.9 % — ABNORMAL HIGH (ref 11.7–14.4)
Seg Neut %: 50.3 % (ref 34.0–71.1)
WBC: 6.9 10*3/uL (ref 4.0–10.0)

## 2010-09-23 LAB — BASIC METABOLIC PANEL
Anion Gap: 15 (ref 7–16)
CO2: 26 mmol/L (ref 20–28)
Calcium: 8.9 mg/dL (ref 8.6–10.2)
Chloride: 99 mmol/L (ref 96–108)
Creatinine: 1.58 mg/dL — ABNORMAL HIGH (ref 0.51–0.95)
GFR,Black: 41 * — AB
GFR,Caucasian: 34 * — AB
Glucose: 231 mg/dL — ABNORMAL HIGH (ref 60–99)
Lab: 23 mg/dL — ABNORMAL HIGH (ref 6–20)
Potassium: 3.2 mmol/L — ABNORMAL LOW (ref 3.3–5.1)
Sodium: 140 mmol/L (ref 133–145)

## 2010-09-23 LAB — VENOUS BLOOD GAS
Base Excess,VENOUS: 3
Bicarbonate,VENOUS: 29 mmol/L (ref 22–30)
CO2 (Calc),VENOUS: 30 mmol/L (ref 22–31)
CO: 1.7 %
FO2 HB,VENOUS: 84 % — ABNORMAL HIGH (ref 63–83)
Hemoglobin: 11.7 g/dL (ref 11.2–15.7)
Methemoglobin: 0.3 % (ref 0.0–1.0)
PCO2,VENOUS: 48 mm Hg (ref 40–50)
PH,VENOUS: 7.39 (ref 7.32–7.42)
PO2,VENOUS: 48 mm Hg — ABNORMAL HIGH (ref 25–43)

## 2010-09-23 LAB — BLOOD BANK HOLD LAVENDER

## 2010-09-23 LAB — HOLD BLUE

## 2010-09-23 LAB — NT-PRO BNP: NT-pro BNP: 123 pg/mL (ref 0–900)

## 2010-09-23 LAB — POCT GLUCOSE: Glucose POCT: 360 mg/dL — ABNORMAL HIGH (ref 60–99)

## 2010-09-23 LAB — CK ISOENZYMES
CK: 136 U/L (ref 34–145)
Mass CKMB: 3.2 ng/mL — ABNORMAL HIGH (ref 0.0–2.9)

## 2010-09-23 LAB — HOLD GREEN WITH GEL

## 2010-09-23 MED ORDER — DULOXETINE HCL 30 MG PO CPEP *I*
60.0000 mg | DELAYED_RELEASE_CAPSULE | Freq: Every day | ORAL | Status: DC
Start: 2010-09-23 — End: 2010-09-26
  Administered 2010-09-24 – 2010-09-25 (×2): 60 mg via ORAL
  Filled 2010-09-23 (×4): qty 2

## 2010-09-23 MED ORDER — ACETAMINOPHEN 325 MG PO TABS *I*
650.0000 mg | ORAL_TABLET | ORAL | Status: DC | PRN
Start: 2010-09-23 — End: 2010-09-26
  Administered 2010-09-24: 650 mg via ORAL
  Filled 2010-09-23 (×2): qty 2

## 2010-09-23 MED ORDER — IPRATROPIUM BROMIDE 0.02 % IN SOLN *I*
500.0000 ug | RESPIRATORY_TRACT | Status: AC
Start: 2010-09-23 — End: 2010-09-23
  Administered 2010-09-23: 0.5 mg via RESPIRATORY_TRACT
  Filled 2010-09-23: qty 2.5

## 2010-09-23 MED ORDER — POTASSIUM CHLORIDE 20 MEQ/15ML (10%) PO LIQD *A*
20.0000 meq | Freq: Once | ORAL | Status: AC
Start: 2010-09-23 — End: 2010-09-23
  Administered 2010-09-23: 20 meq via ORAL
  Filled 2010-09-23: qty 15

## 2010-09-23 MED ORDER — TRAZODONE HCL 50 MG PO TABS *I*
100.0000 mg | ORAL_TABLET | Freq: Every evening | ORAL | Status: DC | PRN
Start: 2010-09-23 — End: 2010-09-25
  Administered 2010-09-23 – 2010-09-24 (×2): 100 mg via ORAL
  Filled 2010-09-23 (×2): qty 2

## 2010-09-23 MED ORDER — METHYLPREDNISOLONE SOD SUCC 125 MG IJ SOLR(62.5MG/ML) *WRAPPED*
125.0000 mg | Freq: Once | INTRAMUSCULAR | Status: AC
Start: 2010-09-23 — End: 2010-09-23
  Administered 2010-09-23: 125 mg via INTRAVENOUS
  Filled 2010-09-23: qty 2

## 2010-09-23 MED ORDER — INSULIN GLARGINE 100 UNIT/ML SC SOLN *WRAPPED*
44.0000 [IU] | Freq: Every evening | SUBCUTANEOUS | Status: DC
Start: 2010-09-23 — End: 2010-09-24
  Administered 2010-09-23: 44 [IU] via SUBCUTANEOUS

## 2010-09-23 MED ORDER — CLOPIDOGREL BISULFATE 75 MG PO TABS *I*
75.0000 mg | ORAL_TABLET | Freq: Every day | ORAL | Status: DC
Start: 2010-09-23 — End: 2010-09-26
  Administered 2010-09-24 – 2010-09-25 (×2): 75 mg via ORAL
  Filled 2010-09-23 (×4): qty 1

## 2010-09-23 MED ORDER — INSULIN LISPRO (HUMAN) 100 UNIT/ML IJ/SC SOLN *WRAPPED*
30.0000 [IU] | Freq: Three times a day (TID) | SUBCUTANEOUS | Status: DC
Start: 2010-09-23 — End: 2010-09-23

## 2010-09-23 MED ORDER — ROSUVASTATIN CALCIUM 20 MG PO TABS *I*
10.0000 mg | ORAL_TABLET | Freq: Every day | ORAL | Status: AC
Start: 2010-09-23 — End: 2010-09-26
  Administered 2010-09-24 – 2010-09-25 (×2): 10 mg via ORAL
  Filled 2010-09-23: qty 2
  Filled 2010-09-23 (×5): qty 1

## 2010-09-23 MED ORDER — IPRATROPIUM-ALBUTEROL 0.5-2.5 MG/3ML IN SOLN *I*
3.0000 mL | RESPIRATORY_TRACT | Status: DC
Start: 2010-09-23 — End: 2010-09-26
  Administered 2010-09-23 – 2010-09-26 (×11): 3 mL via RESPIRATORY_TRACT
  Filled 2010-09-23 (×9): qty 3

## 2010-09-23 MED ORDER — PANTOPRAZOLE SODIUM 40 MG PO TBEC *I*
40.0000 mg | DELAYED_RELEASE_TABLET | Freq: Every morning | ORAL | Status: AC
Start: 2010-09-24 — End: 2010-09-27
  Administered 2010-09-24 – 2010-09-26 (×2): 40 mg via ORAL
  Filled 2010-09-23 (×3): qty 1

## 2010-09-23 MED ORDER — ZOLPIDEM TARTRATE 5 MG PO TABS *I*
5.0000 mg | ORAL_TABLET | Freq: Every evening | ORAL | Status: DC | PRN
Start: 2010-09-23 — End: 2010-09-23

## 2010-09-23 MED ORDER — ONDANSETRON HCL 2 MG/ML IV SOLN *I*
4.0000 mg | Freq: Four times a day (QID) | INTRAMUSCULAR | Status: DC | PRN
Start: 2010-09-23 — End: 2010-09-26

## 2010-09-23 MED ORDER — TRAMADOL HCL 50 MG PO TABS *I*
50.0000 mg | ORAL_TABLET | Freq: Four times a day (QID) | ORAL | Status: DC | PRN
Start: 2010-09-23 — End: 2010-09-26
  Administered 2010-09-24 – 2010-09-26 (×3): 50 mg via ORAL
  Filled 2010-09-23 (×4): qty 1

## 2010-09-23 MED ORDER — ASPIRIN 325 MG PO TABS *I*
325.0000 mg | ORAL_TABLET | Freq: Every day | ORAL | Status: AC
Start: 2010-09-23 — End: 2010-09-25
  Administered 2010-09-23 – 2010-09-25 (×3): 325 mg via ORAL
  Filled 2010-09-23 (×3): qty 1

## 2010-09-23 MED ORDER — AMLODIPINE BESYLATE 5 MG PO TABS *I*
10.0000 mg | ORAL_TABLET | Freq: Every day | ORAL | Status: DC
Start: 2010-09-23 — End: 2010-09-26
  Administered 2010-09-24 – 2010-09-25 (×2): 10 mg via ORAL
  Filled 2010-09-23 (×4): qty 2

## 2010-09-23 MED ORDER — INSULIN LISPRO (HUMAN) 100 UNIT/ML IJ/SC SOLN *WRAPPED*
30.0000 [IU] | Freq: Three times a day (TID) | SUBCUTANEOUS | Status: DC
Start: 2010-09-24 — End: 2010-09-23

## 2010-09-23 MED ORDER — METHYLPREDNISOLONE SOD SUCC 40 MG IJ SOLR(40 MG/ML) *WRAPPED*
60.0000 mg | Freq: Four times a day (QID) | INTRAMUSCULAR | Status: DC
Start: 2010-09-23 — End: 2010-09-24
  Administered 2010-09-23 – 2010-09-24 (×2): 60 mg via INTRAVENOUS
  Filled 2010-09-23: qty 2
  Filled 2010-09-23 (×2): qty 1

## 2010-09-23 MED ORDER — ALBUTEROL SULFATE (2.5 MG/3ML) 0.083% IN NEBU *I*
7.5000 mg | INHALATION_SOLUTION | RESPIRATORY_TRACT | Status: AC
Start: 2010-09-23 — End: 2010-09-23
  Administered 2010-09-23: 7.5 mg via RESPIRATORY_TRACT
  Filled 2010-09-23: qty 9

## 2010-09-23 MED ORDER — INSULIN LISPRO (HUMAN) 100 UNIT/ML IJ/SC SOLN *WRAPPED*
30.0000 [IU] | Freq: Three times a day (TID) | SUBCUTANEOUS | Status: DC
Start: 2010-09-23 — End: 2010-09-25
  Administered 2010-09-23 – 2010-09-24 (×4): 30 [IU] via SUBCUTANEOUS
  Filled 2010-09-23: qty 0.3

## 2010-09-23 MED ORDER — ZOLPIDEM TARTRATE 5 MG PO TABS *I*
10.0000 mg | ORAL_TABLET | Freq: Every evening | ORAL | Status: DC | PRN
Start: 2010-09-23 — End: 2010-09-25
  Administered 2010-09-23 – 2010-09-24 (×2): 10 mg via ORAL
  Filled 2010-09-23 (×2): qty 2

## 2010-09-23 MED ORDER — TRIAMTERENE-HCTZ 37.5-25 MG PO TABS *I*
1.0000 | ORAL_TABLET | Freq: Every morning | ORAL | Status: AC
Start: 2010-09-24 — End: 2010-09-26
  Administered 2010-09-24 – 2010-09-26 (×3): 1 via ORAL
  Filled 2010-09-23 (×4): qty 1

## 2010-09-23 MED ORDER — INSULIN LISPRO (HUMAN) 100 UNIT/ML IJ/SC SOLN *WRAPPED*
40.0000 [IU] | Freq: Three times a day (TID) | SUBCUTANEOUS | Status: DC
Start: 2010-09-23 — End: 2010-09-23
  Filled 2010-09-23: qty 0.4

## 2010-09-23 MED ORDER — POTASSIUM CHLORIDE IN NACL 20-0.9 MEQ/L-% IV SOLN *I*
75.0000 mL/h | INTRAVENOUS | Status: DC
Start: 2010-09-23 — End: 2010-09-24
  Administered 2010-09-24 (×4): 75 mL/h via INTRAVENOUS

## 2010-09-23 MED ORDER — BUPROPION HCL 100 MG PO TB12 *I*
100.0000 mg | ORAL_TABLET | Freq: Two times a day (BID) | ORAL | Status: AC
Start: 2010-09-23 — End: 2010-09-26
  Administered 2010-09-23 – 2010-09-26 (×5): 100 mg via ORAL
  Filled 2010-09-23 (×7): qty 1

## 2010-09-23 MED ORDER — DOCUSATE SODIUM 100 MG PO CAPS *I*
100.0000 mg | ORAL_CAPSULE | Freq: Two times a day (BID) | ORAL | Status: AC
Start: 2010-09-23 — End: 2010-09-26
  Administered 2010-09-23 – 2010-09-26 (×5): 100 mg via ORAL
  Filled 2010-09-23 (×6): qty 1

## 2010-09-23 NOTE — ED Provider Notes (Signed)
History     Chief Complaint   Patient presents with   . Shortness of Breath     Started feeling SOB yesterday. History of COPD, asthma. Has nebulizer but it's broken. Slight chest pain - tightness "from trying to breathe."     HPI Comments: 55 year old female with PMH significant for stroke, CAD, DM II, HTN, hyperlipidemia presents with shortness of breath. Pt reports shortness of breath started last evening along with mid-sternal, non-radiating chest tightness. Exertion does not exacerbate the symptoms. Pt took her inhaler (not sure which one) with no symptom improvement. Pt was unable to use her nebulizer because it's broken. Pt currently rates chest tightness 9/10. Pt denies headache, dizziness, lightheadedness, syncope, palpitations, fever, chills, visual changes, diaphoresis, numbness, tingling, nausea, vomiting, abdominal pain.      Pt reports she has had a stress echo within the last year done in [place]. She is unsure of the results.       Past Medical History   Diagnosis Date   . Asthma    . COPD (chronic obstructive pulmonary disease)    . Coronary artery disease    . Diabetes mellitus    . Hypertension        Past Surgical History   Procedure Date   . Coronary angioplasty with stent placement        Family History   Problem Relation Age of Onset   . Diabetes Other    . Heart disease Other    . Hypertension Other    . Coronary art dis Other        Social History      reports that she has quit smoking. She does not have any smokeless tobacco history on file. She reports that she does not drink alcohol. Her drug and sexual activity histories not on file.    Living Situation     Questions Responses    Patient lives with Child    Homeless     Caregiver for other family member     External Services     Employment Retired    Domestic Violence Risk           Review of Systems   Review of Systems   Constitutional: Negative for fever, chills, diaphoresis and fatigue.   HENT: Negative for congestion and  neck pain.    Eyes: Negative for visual disturbance.   Respiratory: Positive for chest tightness, shortness of breath and wheezing. Negative for cough.    Cardiovascular: Positive for chest pain. Negative for palpitations and leg swelling.   Gastrointestinal: Negative for nausea, vomiting, abdominal pain, diarrhea and constipation.   Genitourinary: Negative for dysuria, urgency, frequency, hematuria, flank pain and difficulty urinating.   Musculoskeletal: Negative for back pain.   Skin: Negative for rash.   Neurological: Negative for dizziness, syncope, weakness, numbness and headaches.   Psychiatric/Behavioral: Negative for confusion and decreased concentration. The patient is not nervous/anxious.        Physical Exam   BP 137/82  Pulse 91  Temp(Src) 36.4 C (97.5 F) (Temporal)  Resp 20  Ht 1.626 m (5\' 4" )  Wt 113.399 kg (250 lb)  BMI 42.91 kg/m2  SpO2 94%    Physical Exam   Vitals reviewed.  Constitutional: She is oriented to person, place, and time. She appears well-developed and well-nourished. No distress.   HENT:   Head: Normocephalic and atraumatic.   Right Ear: External ear normal.   Left Ear: External ear normal.  Nose: Nose normal.   Eyes: Conjunctivae and EOM are normal. Pupils are equal, round, and reactive to light.   Neck: Normal range of motion. Neck supple.   Cardiovascular: Normal rate, regular rhythm and normal heart sounds.    Pulmonary/Chest: Effort normal. She has wheezes.   Abdominal: Soft. Bowel sounds are normal. She exhibits no distension. There is no tenderness. There is no rebound and no guarding.   Musculoskeletal: Normal range of motion. She exhibits no edema and no tenderness.   Neurological: She is alert and oriented to person, place, and time. She has normal reflexes. She displays normal reflexes. No cranial nerve deficit. Coordination normal.   Skin: Skin is warm and dry. No rash noted. She is not diaphoretic.   Psychiatric: She has a normal mood and affect. Her behavior is  normal. Judgment and thought content normal.       Medical Decision Making   MDM  Assessment and Plan:  Gloria Ray is a 55 y.o. female with shortness of breath     1) Neuro:  No active issues.    2) Pulmonary:  Shortness of breath: Pt will receive IV steroids, nebs q4h and q2h prn. Will place pt on telemetry to monitor heart rate and O2 saturation, with NC O2 if sat &lt;90. Will ensure no evolving PNA through blood work and symptomatic changes.     3) Cardiac: Chest tightness: Pt will be placed on telemetry to rule out arrythmia, monitor  HR. Will check troponin and CK in the am.     4) GI:  No active issues.    5) Renal:  No active issues.    6) ID:  No active issues.    7) Heme:  No active issues.    8) FEN:  No active issues.    9) Endo:  DM II: Continue home medications. Will check BGs AC and HS.     10) Code Status:  Full Code      Samara Snide, NP

## 2010-09-23 NOTE — ED Obs Notes (Addendum)
ED OBSERVATION ADMISSION NOTE    Patient seen by me today, 09/23/2010 at 8:15 PM    Current patient status: Observation    History     Chief Complaint   Patient presents with   . Shortness of Breath     Started feeling SOB yesterday. History of COPD, asthma. Has nebulizer but it's broken. Slight chest pain - tightness "from trying to breathe."     HPI Comments: 54yom with pmhx of DMII, HTN, COPD, CAD with hx of PCI, p/w dyspnea, chest tightness, worsening x2 days, pt reports she is out of her inhalers at home and has had worsening difficulty breathing, she denies cough, nausea, emesis, fever, chills, shakes, syncope, sick contacts, or medication changes.     On arrival to hhed pt vitals: 36.4, 91, 20, 137/82, 94RA, cbc, bnp, ck unremarkable, BMP K+ 3.2, BUN 23, Cr 1.58, CXR NAPD -CM, ekg NSR@83  without acute changes, pt given nebulizers, steroids, placed in EOU for further care.     The history is provided by the patient and medical records.       Past Medical History   Diagnosis Date   . Asthma    . COPD (chronic obstructive pulmonary disease)    . Coronary artery disease    . Diabetes mellitus    . Hypertension        Past Surgical History   Procedure Date   . Coronary angioplasty with stent placement        Family History   Problem Relation Age of Onset   . Diabetes Other    . Heart disease Other    . Hypertension Other    . Coronary art dis Other        Social History      reports that she has quit smoking. She does not have any smokeless tobacco history on file. She reports that she does not drink alcohol. Her drug and sexual activity histories not on file.    Living Situation     Questions Responses    Patient lives with Child    Homeless     Caregiver for other family member     External Services     Employment Retired    Domestic Violence Risk           Review of Systems   Review of Systems   Constitutional: Positive for fatigue. Negative for fever, chills, diaphoresis, appetite change and unexpected weight  change.   HENT: Negative.  Negative for trouble swallowing, neck pain and neck stiffness.    Respiratory: Positive for chest tightness and shortness of breath.    Cardiovascular: Positive for chest pain. Negative for palpitations and leg swelling.   Gastrointestinal: Negative for nausea, vomiting, abdominal pain, diarrhea, constipation, blood in stool, abdominal distention, anal bleeding and rectal pain.   Genitourinary: Negative for dysuria, urgency, hematuria and decreased urine volume.   Musculoskeletal: Negative for myalgias.   Skin: Negative for pallor and rash.   Neurological: Negative.  Negative for dizziness, numbness and headaches.   Hematological: Negative.    Psychiatric/Behavioral: Negative.  Negative for agitation.   All other systems reviewed and are negative.        Physical Exam   BP 137/82  Pulse 91  Temp(Src) 36.4 C (97.5 F) (Temporal)  Resp 20  Ht 1.626 m (5\' 4" )  Wt 113.399 kg (250 lb)  BMI 42.91 kg/m2  SpO2 94%    Physical Exam   Nursing note and vitals reviewed.  Constitutional: She is oriented to person, place, and time. She appears well-developed and well-nourished.  Non-toxic appearance. She does not appear ill. No distress.   HENT:   Head: Normocephalic and atraumatic.   Nose: Nose normal.   Mouth/Throat: Uvula is midline, oropharynx is clear and moist and mucous membranes are normal. Mucous membranes are not pale, not dry and not cyanotic. Abnormal dentition.   Eyes: Conjunctivae are normal. No scleral icterus.   Neck: Trachea normal and normal range of motion. Neck supple. No JVD present.   Cardiovascular: Normal rate, regular rhythm, S1 normal, S2 normal, normal heart sounds and intact distal pulses.  Exam reveals no gallop and no friction rub.    No murmur heard.  Pulmonary/Chest: Effort normal. No respiratory distress. She has wheezes (end expiratory wheezes). She has no rhonchi. She has no rales. She exhibits no tenderness.        Coarse breath sounds diffuse bilaterally    Abdominal: Soft. Normal appearance and bowel sounds are normal. She exhibits no distension and no fluid wave. There is no tenderness. There is no rigidity, no rebound and no guarding.   Musculoskeletal: Normal range of motion. She exhibits no edema.        Right ankle: She exhibits no swelling.        Left ankle: She exhibits no swelling.   Neurological: She is alert and oriented to person, place, and time. She has normal strength. She is not disoriented.   Skin: Skin is warm and dry. No rash noted. She is not diaphoretic. No erythema. No pallor.   Psychiatric: She has a normal mood and affect. Her speech is normal and behavior is normal. Judgment and thought content normal. Her mood appears not anxious. Cognition and memory are normal.       Tests   HPI  Medical Decision Making       MDM  Number of Diagnoses or Management Options  COPD exacerbation:   Diagnosis management comments: A:54yom with COPD exacerbation, hypokalemia  P: Pt will receive IV steroids, nebs q4h and q2h prn, will monitor peak flows before and after each nebulization to monitor for improvement. Will place pt on telemetry to monitor heart rate and O2 saturation, with NC O2 if sat <90. Will ensure no evolving PNA through blood work and symptomatic changes.       Theodosia Paling, PA

## 2010-09-23 NOTE — Progress Notes (Signed)
Utilization Management    Level of Care Observation service as of the date 09/23/2010      Marco Collie, RN     Pager: [phone]

## 2010-09-23 NOTE — ED Notes (Signed)
 Patient to observation.

## 2010-09-23 NOTE — ED Provider Notes (Signed)
History     Chief Complaint   Patient presents with   . Shortness of Breath     Started feeling SOB yesterday. History of COPD, asthma. Has nebulizer but it's broken. Slight chest pain - tightness "from trying to breathe."     HPI Comments: Sob since yesterday Nebulizer is broken History of COPD Has MD in [place] None in [place]     Patient is a 55 y.o. female presenting with shortness of breath. The history is provided by the patient.   Shortness of Breath  This is a recurrent problem. The problem has not changed since onset.Associated symptoms include wheezing. Pertinent negatives include no fever, no headaches, no coryza, no rhinorrhea, no sore throat, no swollen glands, no ear pain, no neck pain, no cough, no sputum production, no hemoptysis, no PND, no orthopnea, no chest pain, no syncope, no vomiting, no abdominal pain, no rash, no leg pain, no leg swelling and no claudication. It is unknown what precipitated the problem. She has tried nothing for the symptoms. The treatment provided no relief. She has had prior hospitalizations. Associated medical issues include asthma, COPD, chronic lung disease and CAD. Associated medical issues do not include pneumonia, PE, heart failure, past MI, DVT or recent surgery.       Past Medical History   Diagnosis Date   . Asthma    . COPD (chronic obstructive pulmonary disease)    . Coronary artery disease    . Diabetes mellitus    . Hypertension        Past Surgical History   Procedure Date   . Coronary angioplasty with stent placement        Family History   Problem Relation Age of Onset   . Diabetes Other    . Heart disease Other    . Hypertension Other    . Coronary art dis Other        Social History      reports that she has quit smoking. She does not have any smokeless tobacco history on file. She reports that she does not drink alcohol. Her drug and sexual activity histories not on file.    Living Situation     Questions Responses    Patient lives with Child    Homeless      Caregiver for other family member     External Services     Employment Retired    Domestic Violence Risk           Review of Systems   Review of Systems   Constitutional: Negative for fever, chills, diaphoresis, activity change, appetite change, fatigue and unexpected weight change.   HENT: Negative for ear pain, nosebleeds, congestion, sore throat, rhinorrhea, trouble swallowing, neck pain, neck stiffness, voice change, postnasal drip and ear discharge.    Eyes: Negative for discharge and visual disturbance.   Respiratory: Positive for shortness of breath and wheezing. Negative for cough, hemoptysis, sputum production, choking and chest tightness.    Cardiovascular: Negative for chest pain, palpitations, orthopnea, claudication, leg swelling, syncope and PND.   Gastrointestinal: Negative for nausea, vomiting, abdominal pain, constipation and abdominal distention.   Genitourinary: Negative for dysuria, urgency, frequency, vaginal discharge and difficulty urinating.   Musculoskeletal: Negative for arthralgias.   Skin: Negative.  Negative for rash.   Neurological: Negative for dizziness, syncope, speech difficulty, weakness, light-headedness, numbness and headaches.   Hematological: Negative.    Psychiatric/Behavioral: Negative.        Physical Exam  BP 137/82  Pulse 91  Temp(Src) 36.4 C (97.5 F) (Temporal)  Resp 20  Ht 1.626 m (5\' 4" )  Wt 113.399 kg (250 lb)  BMI 42.91 kg/m2  SpO2 94%    Physical Exam   Constitutional: She is oriented to person, place, and time. She appears well-developed and well-nourished. No distress.   HENT:   Head: Normocephalic and atraumatic.   Right Ear: External ear and ear canal normal.   Left Ear: External ear and ear canal normal.   Nose: Nose normal.   Mouth/Throat: Oropharynx is clear and moist. No oropharyngeal exudate.   Eyes: Conjunctivae and EOM are normal. Pupils are equal, round, and reactive to light. Right eye exhibits no discharge. Left eye exhibits no discharge. No  scleral icterus.   Neck: Normal range of motion. Neck supple. No tracheal deviation present. No thyromegaly present.   Cardiovascular: Normal rate, regular rhythm, normal heart sounds and intact distal pulses.    No murmur heard.  Pulmonary/Chest: Effort normal. No stridor. No respiratory distress. She has wheezes. She has no rales.   Abdominal: Soft. Bowel sounds are normal. She exhibits no distension. There is no tenderness.   Musculoskeletal: Normal range of motion. She exhibits no edema.   Lymphadenopathy:     She has no cervical adenopathy.   Neurological: She is alert and oriented to person, place, and time.   Skin: Skin is warm and dry. She is not diaphoretic.   Psychiatric: She has a normal mood and affect. Her behavior is normal. Judgment and thought content normal.       Medical Decision Making   MDM  Number of Diagnoses or Management Options  Diagnosis management comments: Patient seen by me today, 09/23/2010 at the time of arrival 5:10 PM    Assessment:  55 y.o., female comes to the ED with SOB and wheezing  Differential Diagnosis includes Pneumonia v COPD  Plan: labs imaging       Amount and/or Complexity of Data Reviewed  Clinical lab tests: ordered and reviewed  Tests in the radiology section of CPT: ordered and reviewed      Persistent wheezing For ED OBS stay Will need nebulizer fix and MD referral    Riccardo Holeman Madilyn Hook, MD    Almetta Lovely, MD  09/23/10 [phone]

## 2010-09-24 LAB — BASIC METABOLIC PANEL
Anion Gap: 22 — ABNORMAL HIGH (ref 7–16)
Anion Gap: 17 — ABNORMAL HIGH (ref 7–16)
CO2: 19 mmol/L — ABNORMAL LOW (ref 20–28)
CO2: 21 mmol/L (ref 20–28)
Calcium: 8.7 mg/dL (ref 8.6–10.2)
Calcium: 8.7 mg/dL (ref 8.6–10.2)
Chloride: 95 mmol/L — ABNORMAL LOW (ref 96–108)
Chloride: 98 mmol/L (ref 96–108)
Creatinine: 2.05 mg/dL — ABNORMAL HIGH (ref 0.51–0.95)
Creatinine: 2.04 mg/dL — ABNORMAL HIGH (ref 0.51–0.95)
GFR,Black: 31 * — AB
GFR,Black: 31 * — AB
GFR,Caucasian: 25 * — AB
GFR,Caucasian: 25 * — AB
Glucose: 564 mg/dL (ref 60–99)
Glucose: 378 mg/dL — ABNORMAL HIGH (ref 60–99)
Lab: 27 mg/dL — ABNORMAL HIGH (ref 6–20)
Lab: 28 mg/dL — ABNORMAL HIGH (ref 6–20)
Potassium: 4.5 mmol/L (ref 3.3–5.1)
Potassium: 4.2 mmol/L (ref 3.3–5.1)
Sodium: 136 mmol/L (ref 133–145)
Sodium: 136 mmol/L (ref 133–145)

## 2010-09-24 LAB — POCT GLUCOSE
Glucose POCT: 482 mg/dL — ABNORMAL HIGH (ref 60–99)
Glucose POCT: >500 mg/dL — ABNORMAL HIGH (ref 60–99)
Glucose POCT: 461 mg/dL — ABNORMAL HIGH (ref 60–99)
Glucose POCT: 484 mg/dL — ABNORMAL HIGH (ref 60–99)
Glucose POCT: 433 mg/dL — ABNORMAL HIGH (ref 60–99)
Glucose POCT: 403 mg/dL — ABNORMAL HIGH (ref 60–99)

## 2010-09-24 LAB — CBC AND DIFFERENTIAL
Baso # K/uL: 0 10*3/uL (ref 0.0–0.1)
Basophil %: 0 % (ref 0.1–1.2)
Eos # K/uL: 0 10*3/uL (ref 0.0–0.4)
Eosinophil %: 0 % — ABNORMAL LOW (ref 0.7–5.8)
Hematocrit: 32 % — ABNORMAL LOW (ref 34–45)
Hemoglobin: 10.2 g/dL — ABNORMAL LOW (ref 11.2–15.7)
Lymph # K/uL: 1 10*3/uL — ABNORMAL LOW (ref 1.2–3.7)
Lymphocyte %: 13 % — ABNORMAL LOW (ref 19.3–51.7)
MCV: 81 fL (ref 79–95)
Mono # K/uL: 0.1 10*3/uL — ABNORMAL LOW (ref 0.2–0.9)
Monocyte %: 1 % — ABNORMAL LOW (ref 4.7–12.5)
Neut # K/uL: 6.7 10*3/uL — ABNORMAL HIGH (ref 1.6–6.1)
Platelets: 236 10*3/uL (ref 160–370)
RBC: 4 MIL/uL (ref 3.9–5.2)
RDW: 16.8 % — ABNORMAL HIGH (ref 11.7–14.4)
Seg Neut %: 85.9 % — ABNORMAL HIGH (ref 34.0–71.1)
WBC: 7.8 10*3/uL (ref 4.0–10.0)

## 2010-09-24 LAB — URINALYSIS REFLEX TO CULTURE
Blood,UA: NEGATIVE
Glucose,UA: 1000 mg/dL — AB
Ketones, UA: NEGATIVE
Leuk Esterase,UA: NEGATIVE
Nitrite,UA: NEGATIVE
Protein,UA: NEGATIVE mg/dL
Specific Gravity,UA: 1.02 (ref 1.001–1.030)
pH,UA: 5 (ref 5.0–8.0)

## 2010-09-24 LAB — CK ISOENZYMES
CK: 92 U/L (ref 34–145)
Mass CKMB: 2.7 ng/mL (ref 0.0–2.9)

## 2010-09-24 LAB — MRSA (ORSA) AMPLIFICATION: MRSA (ORSA) Amplification: 0

## 2010-09-24 LAB — TROPONIN T: Troponin T: <0.01 ng/mL (ref 0.00–0.02)

## 2010-09-24 MED ORDER — NICOTINE PATCH REMOVAL *I*
Freq: Every day | Status: DC
Start: 2010-09-24 — End: 2010-09-28
  Filled 2010-09-24: qty 1

## 2010-09-24 MED ORDER — IPRATROPIUM-ALBUTEROL 0.5-2.5 MG/3ML IN SOLN *I*
3.0000 mL | Freq: Four times a day (QID) | RESPIRATORY_TRACT | Status: DC | PRN
Start: 2010-09-24 — End: 2010-09-28
  Administered 2010-09-26: 3 mL via RESPIRATORY_TRACT
  Filled 2010-09-24 (×3): qty 3

## 2010-09-24 MED ORDER — SODIUM CHLORIDE 0.9 % IV SOLN WRAPPED *I*
100.0000 mL/h | Status: DC
Start: 2010-09-24 — End: 2010-09-26
  Administered 2010-09-24 – 2010-09-25 (×18): 100 mL/h via INTRAVENOUS

## 2010-09-24 MED ORDER — NICOTINE 7 MG/24HR TD PT24 *I*
1.0000 | MEDICATED_PATCH | Freq: Every day | TRANSDERMAL | Status: DC
Start: 2010-09-24 — End: 2010-09-28
  Administered 2010-09-24 – 2010-09-28 (×5): 1 via TRANSDERMAL
  Filled 2010-09-24 (×5): qty 1

## 2010-09-24 MED ORDER — METHYLPREDNISOLONE SOD SUCC 40 MG IJ SOLR(40 MG/ML) *WRAPPED*
40.0000 mg | Freq: Four times a day (QID) | INTRAMUSCULAR | Status: DC
Start: 2010-09-24 — End: 2010-09-27
  Administered 2010-09-24 – 2010-09-27 (×12): 40 mg via INTRAVENOUS
  Filled 2010-09-24 (×12): qty 1

## 2010-09-24 MED ORDER — INSULIN REGULAR HUMAN 100 UNIT/ML IJ SOLN *I*
15.0000 [IU] | Freq: Once | INTRAMUSCULAR | Status: AC
Start: 2010-09-24 — End: 2010-09-24
  Administered 2010-09-24: 15 [IU] via INTRAVENOUS

## 2010-09-24 MED ORDER — INSULIN GLARGINE 100 UNIT/ML SC SOLN *WRAPPED*
30.0000 [IU] | Freq: Two times a day (BID) | SUBCUTANEOUS | Status: AC
Start: 2010-09-24 — End: 2010-09-25
  Administered 2010-09-24 – 2010-09-25 (×3): 30 [IU] via SUBCUTANEOUS

## 2010-09-24 NOTE — ED Notes (Signed)
Peak flow pre neb-280, 340,370

## 2010-09-24 NOTE — ED Notes (Signed)
Peak flow post ned-200, 180, 250

## 2010-09-24 NOTE — ED Notes (Signed)
Peak flow pre ned-360, 280,200

## 2010-09-24 NOTE — Progress Notes (Addendum)
Subjective:     Gloria Ray is a  54yom with pmhx of DMII, HTN, COPD, CAD with hx of PCI, p/w dyspnea, chest tightness, worsening x2 days, pt reports she is out of her inhalers at home and has had worsening difficulty breathing, she denies cough, nausea, emesis, fever, chills, shakes, syncope, sick contacts, or medication changes.   She has recently moved to [place] from [place] and has been seen in the Ed for COPD exacerbation twice.  Feeling better, but still very dyspneic.        Intake/Output  No intake or output data in the 24 hours ending 09/24/10 0818     Vital Signs:   Temp:  [36.2 C (97.2 F)-37.1 C (98.8 F)] 37.1 C (98.8 F)  Heart Rate:  [91-109] 106   Resp:  [18-22] 22   BP: (132-150)/(80-90) 150/90 mmHg     Lungs: Auscultation of the lungs revealed bilateral expiratory wheeze.    Cardiovascular: There was a regular rate and rhythm without any murmurs, gallops, rubs.   Tachycardic.    Abdomen: Soft and nontender with normal bowel sounds.     Neurologic: Alert and oriented x 3.     Extremitits: +LE edema    Data:                  Lab 09/24/10 0648 09/23/10 1820   WBC 7.8 6.9   HGB 10.2* 11.711.0*   HCT 32* 35   PLT 236 231   INR -- --     Other Labs:             Lab 09/24/10 0648 09/23/10 1820   NA 136 140   K 4.5 3.2*   CL 95* 99   CO2 19* 26              X-rays the past 24 hours: * Portable Chest Standard Ap Single View    09/23/2010  IMPRESSION:   No acute pulmonary infiltrate.          Active Meds:       . ipratropium-albuterol  3 mL Nebulization Q4H While awake   . methylPREDNISolone sodium succinate  60 mg Intravenous Q6H   . clopidogrel  75 mg Oral Daily   . rosuvastatin  10 mg Oral Daily with dinner   . buPROPion  100 mg Oral BID   . triamterene-hydrochlorothiazide  1 tablet Oral QAM   . insulin glargine  44 Units Subcutaneous Nightly   . amLODIPine  10 mg Oral Daily   . aspirin  325 mg Oral Daily   . DULoxetine  60 mg Oral Daily   . docusate sodium  100 mg Oral BID    . pantoprazole  40 mg Oral QAM   . insulin lispro  30 Units Subcutaneous TID AC     There are no hospital problems to display for this patient.      Assessment and Plan:     COPD exacerbation:   Diagnosis management comments: A:54yom with COPD exacerbation, hypokalemia   Continue IV steroids, nebs q4h and q2h prn, will monitor peak flows before and after each nebulization to monitor for improvement. Will place pt on telemetry to monitor heart rate and O2 saturation, with NC O2 if sat <90.   Patient's work of breathing is still significant.  Continue IV Solumedrol.  Add PO Azithromycin.    DIABETES:    BG is > 500. Patient uses lantus at home.  Prescribed 80 Units Q HS  with Humalog 3o Units prnadially.  Uses only 40 Units of Lantus.  BGs now high secondary to Steroids.  Increase Lantus to 60 Units /24 hrs and give 30 Units BID while in Hospital.    Hypokalemia resolved  Recheck in AM    Probable D/C to home tomorrow.    Continue all other home meds as before    Author: Rudolpho Sevin, MD  as of: 09/24/2010  at: 8:18 AM

## 2010-09-24 NOTE — ED Notes (Signed)
Peak flow  Pre: 240  Post: 310

## 2010-09-24 NOTE — ED Notes (Signed)
Peak flow  Pre: 280  Post: 290

## 2010-09-24 NOTE — ED Notes (Signed)
Patient BG-564.  Dr. Tasia Catchings notified.  New orders written.  Nursing to continue to monitor patient.  Sharman Cheek

## 2010-09-24 NOTE — ED Notes (Signed)
Peak flow post neb-240, 330, 380

## 2010-09-24 NOTE — ED Notes (Signed)
Peak lo

## 2010-09-24 NOTE — Progress Notes (Signed)
Utilization Management    Level of Care Inpatient as of the date 09/24/2010      Lennie Muckle, RN     Pager: [phone]

## 2010-09-24 NOTE — ED Notes (Signed)
Patient's HR 106-117.  Patient having no CP but occasional SOB with exertion.  Jennifer Tyll notified.  No new orders written.  Nursing to continue to monitor patient.  Rudi Rummage

## 2010-09-24 NOTE — ED Notes (Signed)
Patient c/o feeling SOB after getting up to use the bathroom.  Web paged x-cover Victorino Dike [phone]).  Duoneb ordered.  Writer attempted to give prn neb treatment but patient states she does not need the treatment anymore.  Nursing to continue to monitor patient.  Rudi Rummage

## 2010-09-24 NOTE — ED Notes (Signed)
Patient's BG-484.  Patient given 30 units of Humalog per order.  Victorino Dike Tyll notified via web page.  No new orders given.  Nursing to continue to monitor patient.  Rudi Rummage

## 2010-09-25 ENCOUNTER — Inpatient Hospital Stay
Admit: 2010-09-25 | Disposition: A | Discharge status: Home / Self Care | Payer: Self-pay | Source: Ambulatory Visit | Attending: Internal Medicine | Admitting: Internal Medicine

## 2010-09-25 LAB — BASIC METABOLIC PANEL
Anion Gap: 11 (ref 7–16)
CO2: 25 mmol/L (ref 20–28)
Calcium: 8.8 mg/dL (ref 8.6–10.2)
Chloride: 99 mmol/L (ref 96–108)
Creatinine: 1.8 mg/dL — ABNORMAL HIGH (ref 0.51–0.95)
GFR,Black: 36 * — AB
GFR,Caucasian: 29 * — AB
Glucose: 391 mg/dL — ABNORMAL HIGH (ref 60–99)
Lab: 28 mg/dL — ABNORMAL HIGH (ref 6–20)
Potassium: 4.7 mmol/L (ref 3.3–5.1)
Sodium: 135 mmol/L (ref 133–145)

## 2010-09-25 LAB — POCT GLUCOSE
Glucose POCT: 442 mg/dL — ABNORMAL HIGH (ref 60–99)
Glucose POCT: 379 mg/dL — ABNORMAL HIGH (ref 60–99)
Glucose POCT: 461 mg/dL — ABNORMAL HIGH (ref 60–99)
Glucose POCT: 383 mg/dL — ABNORMAL HIGH (ref 60–99)
Glucose POCT: 268 mg/dL — ABNORMAL HIGH (ref 60–99)
Glucose POCT: 137 mg/dL — ABNORMAL HIGH (ref 60–99)
Glucose POCT: 386 mg/dL — ABNORMAL HIGH (ref 60–99)

## 2010-09-25 LAB — CBC AND DIFFERENTIAL
Baso # K/uL: 0 10*3/uL (ref 0.0–0.1)
Basophil %: 0.1 % (ref 0.1–1.2)
Eos # K/uL: 0 10*3/uL (ref 0.0–0.4)
Eosinophil %: 0 % — ABNORMAL LOW (ref 0.7–5.8)
Hematocrit: 32 % — ABNORMAL LOW (ref 34–45)
Hemoglobin: 10.3 g/dL — ABNORMAL LOW (ref 11.2–15.7)
Lymph # K/uL: 1.2 10*3/uL (ref 1.2–3.7)
Lymphocyte %: 8.2 % — ABNORMAL LOW (ref 19.3–51.7)
MCV: 80 fL (ref 79–95)
Mono # K/uL: 0.8 10*3/uL (ref 0.2–0.9)
Monocyte %: 5 % (ref 4.7–12.5)
Neut # K/uL: 12.9 10*3/uL — ABNORMAL HIGH (ref 1.6–6.1)
Nucl RBC %: 0 /100 WBC (ref 0.0–0.2)
Platelets: 256 10*3/uL (ref 160–370)
RBC: 4 MIL/uL (ref 3.9–5.2)
RDW: 17.1 % — ABNORMAL HIGH (ref 11.7–14.4)
Seg Neut %: 86.4 % — ABNORMAL HIGH (ref 34.0–71.1)
WBC: 14.9 10*3/uL — ABNORMAL HIGH (ref 4.0–10.0)

## 2010-09-25 LAB — MRSA (ORSA) AMPLIFICATION: MRSA (ORSA) Amplification: 0

## 2010-09-25 LAB — URINALYSIS REFLEX TO CULTURE

## 2010-09-25 MED ORDER — GLUCAGON HCL (RDNA) 1 MG IJ SOLR *WRAPPED*
1.0000 mg | INTRAMUSCULAR | Status: DC | PRN
Start: 2010-09-25 — End: 2010-09-28

## 2010-09-25 MED ORDER — INSULIN LISPRO (HUMAN) 100 UNIT/ML IJ/SC SOLN *WRAPPED*
0.0000 [IU] | Freq: Three times a day (TID) | SUBCUTANEOUS | Status: DC
Start: 2010-09-25 — End: 2010-09-27
  Administered 2010-09-25: 1 [IU] via SUBCUTANEOUS
  Administered 2010-09-26: 3 [IU] via SUBCUTANEOUS
  Administered 2010-09-26: 4 [IU] via SUBCUTANEOUS
  Administered 2010-09-26 – 2010-09-27 (×2): 3 [IU] via SUBCUTANEOUS

## 2010-09-25 MED ORDER — INSULIN LISPRO (HUMAN) 100 UNIT/ML IJ/SC SOLN *WRAPPED*
40.0000 [IU] | Freq: Three times a day (TID) | SUBCUTANEOUS | Status: DC
Start: 2010-09-25 — End: 2010-09-25
  Administered 2010-09-25 (×2): 40 [IU] via SUBCUTANEOUS
  Filled 2010-09-25: qty 0.4

## 2010-09-25 MED ORDER — TRAZODONE HCL 50 MG PO TABS *I*
50.0000 mg | ORAL_TABLET | Freq: Every evening | ORAL | Status: DC | PRN
Start: 2010-09-25 — End: 2010-09-26
  Administered 2010-09-26: 50 mg via ORAL
  Filled 2010-09-25 (×2): qty 1

## 2010-09-25 MED ORDER — GLUCOSE 40 % PO GEL *I*
15.0000 g | ORAL | Status: DC | PRN
Start: 2010-09-25 — End: 2010-09-28

## 2010-09-25 MED ORDER — INSULIN REGULAR HUMAN 100 UNIT/ML IJ SOLN *I*
15.0000 [IU] | Freq: Once | INTRAMUSCULAR | Status: AC
Start: 2010-09-25 — End: 2010-09-25
  Administered 2010-09-25: 15 [IU] via INTRAVENOUS

## 2010-09-25 MED ORDER — DEXTROSE 50 % IV SOLN *I*
25.0000 g | INTRAVENOUS | Status: DC | PRN
Start: 2010-09-25 — End: 2010-09-28

## 2010-09-25 MED ORDER — ZOLPIDEM TARTRATE 5 MG PO TABS *I*
5.0000 mg | ORAL_TABLET | Freq: Every evening | ORAL | Status: DC | PRN
Start: 2010-09-25 — End: 2010-09-26
  Administered 2010-09-25: 5 mg via ORAL
  Filled 2010-09-25: qty 1

## 2010-09-25 MED ORDER — INSULIN GLARGINE 100 UNIT/ML SC SOLN *WRAPPED*
40.0000 [IU] | Freq: Two times a day (BID) | SUBCUTANEOUS | Status: DC
Start: 2010-09-25 — End: 2010-09-27
  Administered 2010-09-25 – 2010-09-26 (×3): 40 [IU] via SUBCUTANEOUS

## 2010-09-25 MED ORDER — ZOLPIDEM TARTRATE 5 MG PO TABS *I*
5.0000 mg | ORAL_TABLET | Freq: Once | ORAL | Status: AC
Start: 2010-09-26 — End: 2010-09-26
  Administered 2010-09-26: 5 mg via ORAL
  Filled 2010-09-25: qty 1

## 2010-09-25 NOTE — ED Notes (Addendum)
Called lab to check on 2nd MRSA amplification, lab is still pending. Nursing supervisor spoke to Dr. Burnett Harry who stated that 2nd amplification must come back before contact precautions can be removed. 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 Dr. Burnett Harry stated we still needed to wait until our 2nd amplification comes back negative. Nursing will continue to monitor.

## 2010-09-25 NOTE — Progress Notes (Addendum)
Subjective:     Patient still has a significant expiratory wheeze.  Very hard to wake up this AM.  Snoring loudly and probably has sleep apnea.  BGs still>400    Intake/Output    Intake/Output Summary (Last 24 hours) at 09/25/10 0804  Last data filed at 09/25/10 0630   Gross per 24 hour   Intake   1448 ml   Output   2425 ml   Net   -977 ml        Vital Signs:   Temp:  [36.1 C (97 F)-37.5 C (99.5 F)] 36.1 C (97 F)  Heart Rate:  [88-114] 88   Resp:  [16-20] 18   BP: (122-144)/(68-92) 138/80 mmHg     Lungs: Auscultation of the lungs revealed prolonged expiratory phase with expiratory wheeze.    Cardiovascular: There was a regular rate and rhythm without any murmurs, gallops, rubs.     Abdomen: Soft and nontender with normal bowel sounds.     Neurologic: Hard to awaken, but has no focal signs    Extremitits: No cyanosis, clubbing or edema    Data:                  Lab 09/25/10 0625 09/24/10 0648 09/23/10 1820   WBC 14.9* 7.8 6.9   HGB 10.3* 10.2* 11.711.0*   HCT 32* 32* 35   PLT 256 236 231   INR -- -- --     Other Labs:             Lab 09/25/10 0625 09/24/10 2230 09/24/10 0648   NA 135 136 136   K 4.7 4.2 4.5   CL 99 98 95*   CO2 25 21 19*              X-rays the past 24 hours: * Portable Chest Standard Ap Single View    09/23/2010  IMPRESSION:   No acute pulmonary infiltrate.          Lab 09/25/10 0625 09/24/10 2230 09/24/10 0648   NA 135 136 136   K 4.7 4.2 4.5   CL 99 98 95*   CO2 25 21 19*   UN 28* 28* 27*   CREAT 1.80* 2.04* 2.05*   EGFRR -- -- --   GLU 391* 378* 564*   CA 8.8 8.7 8.7   ALB -- -- --   ALB1 -- -- --   PO4 -- -- --       Active Meds:       . insulin glargine  30 Units Subcutaneous Q12H SCH   . methylPREDNISolone sodium succinate  40 mg Intravenous Q6H   . nicotine  1 patch Transdermal Daily   . nicotine   Transdermal Daily   . ipratropium-albuterol  3 mL Nebulization Q4H While awake   . clopidogrel  75 mg Oral Daily   . rosuvastatin  10 mg Oral Daily with dinner   .  buPROPion  100 mg Oral BID   . triamterene-hydrochlorothiazide  1 tablet Oral QAM   . amLODIPine  10 mg Oral Daily   . aspirin  325 mg Oral Daily   . DULoxetine  60 mg Oral Daily   . docusate sodium  100 mg Oral BID   . pantoprazole  40 mg Oral QAM   . insulin lispro  30 Units Subcutaneous TID AC     There are no hospital problems to display for this patient.      Assessment and Plan:  Assessment and Plan:   COPD exacerbation:   Patient continues to be very wheezy with a significant work of breathing  BGs are significantly high  Will reduce IV Solumedrol to 40 mg Q 8 hrs.  Continue Nebs  Patient most probably has Sleep Apnea.  Desaturating to the 70%. Start 2 Litres of O2      DIABETES:   BG's still > 400.  Increase Lantus to 40 Units BID  Continue SS Insulin    RENAL:  Renal functions improving  Hypokalemia resolved   Recheck in AM     Patient is very somnolent and this is probably a combination of OSA and Ambien + Trazodone.  Ambien and Trazodone doses reduced to half.      Probable D/C to home tomorrow.   Continue all other home meds as before      Author: Rudolpho Sevin, MD  as of: 09/25/2010  at: 8:04 AM

## 2010-09-25 NOTE — ED Notes (Signed)
Patients BG is 442. Cross cover paged. New order given. Writer will continue to monitor.

## 2010-09-25 NOTE — Progress Notes (Signed)
Received from ED obs. Patient lethargic, but easily aroused. Oriented x 3. VSS. States that she is hungry and thirsty. Given small sips of ginger ale, which made patient cough. Using O2 @ 2L. Will monitor.

## 2010-09-25 NOTE — ED Notes (Addendum)
Checked BG level due to giving Humalog 40 units without food being given. BG level 383. Pt still lethargic to eat will continue to monitor BG levels. Nursing will continue to monitor.

## 2010-09-25 NOTE — ED Notes (Signed)
Gave report to Chad 4. Pt to transfer up in bed. Nursing will continue to monitor.

## 2010-09-25 NOTE — ED Notes (Signed)
Pt still very lethargic and unable to keep eyes open to swallow pills safely. Nursing will continue to monitor.

## 2010-09-25 NOTE — ED Notes (Addendum)
Found a pill in patients bed and called pharmacy to verify. Pharmacy stated that it was the 10mg  tablet of Ambien. Pt still lethargic with out taking Ambien last evening. Notified Dr. Tasia Catchings. Nursing will continue to monitor./

## 2010-09-25 NOTE — ED Notes (Signed)
Pt currently has nebulizer treatment running prior to treatment pt o2 level dropped to 78% Dr. Earna Coder at bedside and stated to place 2 LNC after treatment. Nursing to continue to monitor.

## 2010-09-25 NOTE — Progress Notes (Signed)
When patient received lunch tray, was able to eat chicken sandwich without coughing. Coughed when attempted to eat peaches. Given QD meds, crushed in pudding, which patient tolerated well. Will give thickened liquids and continue to crush meds for now. Lethargy and sleepiness continues. Ambulated to bathroom with standby assist. Will continue to monitor.

## 2010-09-25 NOTE — ED Notes (Signed)
Notified Dr. Tasia Catchings that will be holding pills until pt is able to drink without coughing due to risk of aspiration Nursing will continue to monitor.

## 2010-09-25 NOTE — ED Notes (Signed)
Pt woke to eat breakfast pt tried few sips of thickened water was able to open eyes completely for more than few seconds and was able to state where she was and name. Placed pt in complete upright position.  Pt started eating breakfast and began coughing on food. Went to assist pt with eating and told pt that eating at this current moment was not a good idea due to risk for aspiration. Nursing will continue to monitor.

## 2010-09-26 ENCOUNTER — Encounter: Payer: Self-pay | Admitting: Internal Medicine

## 2010-09-26 DIAGNOSIS — J441 Chronic obstructive pulmonary disease with (acute) exacerbation: Principal | ICD-10-CM | POA: Diagnosis present

## 2010-09-26 LAB — CBC AND DIFFERENTIAL
Baso # K/uL: 0 10*3/uL (ref 0.0–0.1)
Basophil %: 0.1 % (ref 0.1–1.2)
Eos # K/uL: 0 10*3/uL (ref 0.0–0.4)
Eosinophil %: 0 % — ABNORMAL LOW (ref 0.7–5.8)
Hematocrit: 33 % — ABNORMAL LOW (ref 34–45)
Hemoglobin: 10.5 g/dL — ABNORMAL LOW (ref 11.2–15.7)
Lymph # K/uL: 1.5 10*3/uL (ref 1.2–3.7)
Lymphocyte %: 12.9 % — ABNORMAL LOW (ref 19.3–51.7)
MCV: 80 fL (ref 79–95)
Mono # K/uL: 0.4 10*3/uL (ref 0.2–0.9)
Monocyte %: 3 % — ABNORMAL LOW (ref 4.7–12.5)
Neut # K/uL: 9.9 10*3/uL — ABNORMAL HIGH (ref 1.6–6.1)
Platelets: 267 10*3/uL (ref 160–370)
RBC: 4.1 MIL/uL (ref 3.9–5.2)
RDW: 17.1 % — ABNORMAL HIGH (ref 11.7–14.4)
Seg Neut %: 83.1 % — ABNORMAL HIGH (ref 34.0–71.1)
WBC: 11.9 10*3/uL — ABNORMAL HIGH (ref 4.0–10.0)

## 2010-09-26 LAB — POCT GLUCOSE
Glucose POCT: 474 mg/dL — ABNORMAL HIGH (ref 60–99)
Glucose POCT: 319 mg/dL — ABNORMAL HIGH (ref 60–99)
Glucose POCT: 341 mg/dL — ABNORMAL HIGH (ref 60–99)
Glucose POCT: 401 mg/dL — ABNORMAL HIGH (ref 60–99)
Glucose POCT: 460 mg/dL — ABNORMAL HIGH (ref 60–99)

## 2010-09-26 LAB — BASIC METABOLIC PANEL
Anion Gap: 13 (ref 7–16)
CO2: 25 mmol/L (ref 20–28)
Calcium: 9.2 mg/dL (ref 8.6–10.2)
Chloride: 98 mmol/L (ref 96–108)
Creatinine: 1.19 mg/dL — ABNORMAL HIGH (ref 0.51–0.95)
GFR,Black: 57 * — AB
GFR,Caucasian: 47 * — AB
Glucose: 365 mg/dL — ABNORMAL HIGH (ref 60–99)
Lab: 25 mg/dL — ABNORMAL HIGH (ref 6–20)
Potassium: 4.6 mmol/L (ref 3.3–5.1)
Sodium: 136 mmol/L (ref 133–145)

## 2010-09-26 MED ORDER — CLOPIDOGREL BISULFATE 75 MG PO TABS *I*
75.0000 mg | ORAL_TABLET | Freq: Every day | ORAL | Status: DC
Start: 2010-09-26 — End: 2010-09-26

## 2010-09-26 MED ORDER — ACETAMINOPHEN 325 MG PO TABS *I*
650.0000 mg | ORAL_TABLET | ORAL | Status: DC | PRN
Start: 2010-09-26 — End: 2010-09-28

## 2010-09-26 MED ORDER — PANTOPRAZOLE SODIUM 40 MG PO TBEC *I*
40.0000 mg | DELAYED_RELEASE_TABLET | Freq: Every morning | ORAL | Status: DC
Start: 2010-09-27 — End: 2010-09-27

## 2010-09-26 MED ORDER — INSULIN LISPRO (HUMAN) 100 UNIT/ML IJ/SC SOLN *WRAPPED*
10.0000 [IU] | Freq: Once | SUBCUTANEOUS | Status: AC
Start: 2010-09-26 — End: 2010-09-26
  Administered 2010-09-26: 10 [IU] via SUBCUTANEOUS

## 2010-09-26 MED ORDER — TRAMADOL HCL 50 MG PO TABS *I*
50.0000 mg | ORAL_TABLET | Freq: Four times a day (QID) | ORAL | Status: DC | PRN
Start: 2010-09-26 — End: 2010-09-28
  Administered 2010-09-26: 50 mg via ORAL
  Filled 2010-09-26: qty 1

## 2010-09-26 MED ORDER — TRIAMTERENE-HCTZ 37.5-25 MG PO TABS *I*
1.0000 | ORAL_TABLET | Freq: Every morning | ORAL | Status: DC
Start: 2010-09-27 — End: 2010-09-28
  Administered 2010-09-27 – 2010-09-28 (×2): 1 via ORAL
  Filled 2010-09-26 (×2): qty 1

## 2010-09-26 MED ORDER — IPRATROPIUM-ALBUTEROL 0.5-2.5 MG/3ML IN SOLN *I*
3.0000 mL | RESPIRATORY_TRACT | Status: DC
Start: 2010-09-26 — End: 2010-09-28
  Administered 2010-09-26 – 2010-09-28 (×8): 3 mL via RESPIRATORY_TRACT
  Filled 2010-09-26 (×10): qty 3

## 2010-09-26 MED ORDER — MOXIFLOXACIN HCL 400 MG PO TABS *I*
400.0000 mg | ORAL_TABLET | Freq: Every day | ORAL | Status: DC
Start: 2010-09-26 — End: 2010-09-28
  Administered 2010-09-26 – 2010-09-28 (×3): 400 mg via ORAL
  Filled 2010-09-26 (×3): qty 1

## 2010-09-26 MED ORDER — ZOLPIDEM TARTRATE 5 MG PO TABS *I*
5.0000 mg | ORAL_TABLET | Freq: Every evening | ORAL | Status: DC | PRN
Start: 2010-09-26 — End: 2010-09-28
  Administered 2010-09-26 – 2010-09-27 (×2): 5 mg via ORAL
  Filled 2010-09-26 (×2): qty 1

## 2010-09-26 MED ORDER — TRAZODONE HCL 50 MG PO TABS *I*
50.0000 mg | ORAL_TABLET | Freq: Every evening | ORAL | Status: DC | PRN
Start: 2010-09-26 — End: 2010-09-28
  Administered 2010-09-26 – 2010-09-27 (×2): 50 mg via ORAL
  Filled 2010-09-26 (×2): qty 1

## 2010-09-26 MED ORDER — DULOXETINE HCL 30 MG PO CPEP *I*
60.0000 mg | DELAYED_RELEASE_CAPSULE | Freq: Every day | ORAL | Status: DC
Start: 2010-09-27 — End: 2010-09-28
  Administered 2010-09-27 – 2010-09-28 (×2): 60 mg via ORAL
  Filled 2010-09-26 (×2): qty 2

## 2010-09-26 MED ORDER — AMLODIPINE BESYLATE 5 MG PO TABS *I*
10.0000 mg | ORAL_TABLET | Freq: Every day | ORAL | Status: DC
Start: 2010-09-26 — End: 2010-09-28
  Administered 2010-09-26 – 2010-09-27 (×2): 10 mg via ORAL
  Filled 2010-09-26 (×2): qty 2

## 2010-09-26 MED ORDER — INSULIN LISPRO (HUMAN) 100 UNIT/ML IJ/SC SOLN *WRAPPED*
15.0000 [IU] | Freq: Once | SUBCUTANEOUS | Status: AC
Start: 2010-09-26 — End: 2010-09-26
  Administered 2010-09-26: 15 [IU] via SUBCUTANEOUS

## 2010-09-26 MED ORDER — ROSUVASTATIN CALCIUM 20 MG PO TABS *I*
10.0000 mg | ORAL_TABLET | Freq: Every day | ORAL | Status: DC
Start: 2010-09-26 — End: 2010-09-26

## 2010-09-26 MED ORDER — ZOLPIDEM TARTRATE 5 MG PO TABS *I*
5.0000 mg | ORAL_TABLET | Freq: Every evening | ORAL | Status: DC | PRN
Start: 2010-09-26 — End: 2010-09-26

## 2010-09-26 MED ORDER — ROSUVASTATIN CALCIUM 20 MG PO TABS *I*
10.0000 mg | ORAL_TABLET | Freq: Every day | ORAL | Status: DC
Start: 2010-09-26 — End: 2010-09-28
  Administered 2010-09-26 – 2010-09-27 (×2): 10 mg via ORAL
  Filled 2010-09-26: qty 1

## 2010-09-26 MED ORDER — ASPIRIN 325 MG PO TABS *I*
325.0000 mg | ORAL_TABLET | Freq: Every day | ORAL | Status: DC
Start: 2010-09-26 — End: 2010-09-28
  Administered 2010-09-26 – 2010-09-28 (×3): 325 mg via ORAL
  Filled 2010-09-26 (×3): qty 1

## 2010-09-26 MED ORDER — BUPROPION HCL 100 MG PO TB12 *I*
200.0000 mg | ORAL_TABLET | Freq: Two times a day (BID) | ORAL | Status: DC
Start: 2010-09-26 — End: 2010-09-28
  Administered 2010-09-26 – 2010-09-28 (×4): 200 mg via ORAL
  Filled 2010-09-26 (×4): qty 2

## 2010-09-26 MED ORDER — CLOPIDOGREL BISULFATE 75 MG PO TABS *I*
75.0000 mg | ORAL_TABLET | Freq: Every day | ORAL | Status: DC
Start: 2010-09-27 — End: 2010-09-28
  Administered 2010-09-27 – 2010-09-28 (×2): 75 mg via ORAL
  Filled 2010-09-26 (×2): qty 1

## 2010-09-26 MED ORDER — DOCUSATE SODIUM 100 MG PO CAPS *I*
100.0000 mg | ORAL_CAPSULE | Freq: Two times a day (BID) | ORAL | Status: DC
Start: 2010-09-26 — End: 2010-09-28
  Administered 2010-09-26 – 2010-09-28 (×4): 100 mg via ORAL
  Filled 2010-09-26 (×3): qty 1

## 2010-09-26 MED ORDER — DULOXETINE HCL 30 MG PO CPEP *I*
60.0000 mg | DELAYED_RELEASE_CAPSULE | Freq: Every day | ORAL | Status: DC
Start: 2010-09-26 — End: 2010-09-26

## 2010-09-26 NOTE — Interdisciplinary Rounds (Addendum)
Interdisciplinary Rounds Note    Date: 09/26/2010   Time: 1:57 PM   Attendance:  Care Coordinator, Physical Therapist and Registered Nurse    Admit Date/Time:  09/25/2010  8:18 AM    Principal Problem: COPD exacerbation  Problem List:   Patient Active Problem List   Diagnoses Date Noted   . COPD exacerbation 09/26/2010   . Chronic Obstructive Pulmonary Disease 12/30/2008     Created by Conversion       . Coronary Artery Disease 03/19/2007     Created by Conversion       . Cellulitis 03/19/2007     Created by Conversion       . Prior Myocardial Infarction 03/19/2007     Created by Conversion       . Hypertension 03/19/2007     Created by Conversion       . Asthma With Acute Exacerbation 03/19/2007     Created by Conversion       . Diabetes Mellitus 03/19/2007     Created by Conversion       . Ischemic Stroke 03/06/2006     Created by Conversion           The patient's problem list and interdisciplinary care plan was reviewed.    Discharge Planning  Lives in: Multi-level apartment  Location of bedroom: 1st level  Location of bathroom: 1st level  Lives With: Child  Can they assist with pt needs after discharge?: Yes  *Does patient currently have home care services?: No (would like to have services)     *Current External Services: None                      Plan    Anticipated Discharge Date:     Discharge Disposition: Home  With out  Service    (  [facility] can not  Take)

## 2010-09-26 NOTE — Progress Notes (Addendum)
Subjective:     Patient is much more alert and oriented today.  She has multiple complaints, including headache, neck ache, shortness of breath, as well as difficulties swallowing  thin liquids.  The patient's respiratory status appears to be better compared to yesterday.  Blood glucose is somewhat better controlled on a much higher dose of Lantus insulin.    Intake/Output    Intake/Output Summary (Last 24 hours) at 09/26/10 1134  Last data filed at 09/26/10 0900   Gross per 24 hour   Intake 3188.33 ml   Output      0 ml   Net 3188.33 ml        Vital Signs:   Temp:  [35.7 C (96.3 F)-37 C (98.6 F)] 36.5 C (97.7 F)  Heart Rate:  [82-105] 87   Resp:  [20-24] 24   BP: (125-146)/(58-102) 146/102 mmHg     Lungs: Auscultation of the lungs revealed bilateral expiratory wheezes as well as the prolonged phase of expiration.    Cardiovascular: There was a regular rate and rhythm without any murmurs, gallops, rubs.     Abdomen: Soft and nontender with normal bowel sounds.     Neurologic: Alert and oriented x 3. Complains of difficulty with swallowing    Extremitits: 2+ lower extremity oedema    Data:                  Lab 09/26/10 0619 09/25/10 0625 09/24/10 0648   WBC 11.9* 14.9* 7.8   HGB 10.5* 10.3* 10.2*   HCT 33* 32* 32*   PLT 267 256 236   INR -- -- --     Other Labs:             Lab 09/26/10 0619 09/25/10 0625 09/24/10 2230   NA 136 135 136   K 4.6 4.7 4.2   CL 98 99 98   CO2 25 25 21               X-rays the past 24 hours: * Portable Chest Standard Ap Single View    09/23/2010  IMPRESSION:   No acute pulmonary infiltrate.          Lab 09/26/10 0619 09/25/10 0625 09/24/10 0648   WBC 11.9* 14.9* 7.8   HGB 10.5* 10.3* 10.2*   HCT 33* 32* 32*   PLT 267 256 236         Lab 09/26/10 0619 09/25/10 0625 09/24/10 2230   NA 136 135 136   K 4.6 4.7 4.2   CL 98 99 98   CO2 25 25 21    UN 25* 28* 28*   CREAT 1.19* 1.80* 2.04*   EGFRR -- -- --   GLU 365* 391* 378*   CA 9.2 8.8 8.7   ALB -- -- --   ALB1 -- --  --   PO4 -- -- --       Active Meds:       . insulin glargine  40 Units Subcutaneous Q12H SCH   . insulin lispro  0-12 Units Subcutaneous TID AC   . methylPREDNISolone sodium succinate  40 mg Intravenous Q6H   . nicotine  1 patch Transdermal Daily   . nicotine   Transdermal Daily   . ipratropium-albuterol  3 mL Nebulization Q4H While awake   . rosuvastatin  10 mg Oral Daily with dinner   . buPROPion  100 mg Oral BID   . docusate sodium  100 mg Oral BID   .  pantoprazole  40 mg Oral QAM     There are no hospital problems to display for this patient.        Assessment and Plan:     COPD exacerbation:   Patient's respiratory status and work of breathing have both improved significantly.  There is less expiratory wheezing that she continues to be significantly dyspneic on minimal exertion  The patient is responding well to intravenous steroids as well as nebulizer.  I will be starting PO Moxifloxacin for a possible super added pulmonary infection.      DIABETES:   Patient's Lantus was increased to 40 mg b.i.d. Yesterday and the blood glucose is much better controlled. continue with the present dose of lantus insulin as well as regular insulin sliding scale with meals      RENAL:   Renal functions have almost completely normalized and electrolyte abnormalities have normalized as well.    The patient is not as somnolent today. Her trazodone and Ambien  and were reduced to half of what she was taking previously.  I would have speech and swallow evaluate her on account of her problems with swallowing  She does have a remote history of the CVA which caused significant issues with swallowing.    The patient will need asleep study is an outpatient as she almost surely has obstructive sleep apnoea    Anticipate discharge in the next 1 to 2 days.        Author: Rudolpho Sevin, MD  as of: 09/26/2010  at: 11:34 AM

## 2010-09-26 NOTE — Progress Notes (Signed)
P: 0240 BG 474  A: Night float notified. 10mg  Humalog ordered and administered.  R: BG to be rechecked at 0700 per night float request

## 2010-09-26 NOTE — Progress Notes (Signed)
Bedside Swallow Evaluation    Impressions:  Mild oropharyngeal dysphagia impacted by respiratory status.  Per Pt, chronic dysphagia s/p CVA in 2008.  Pt with dry/brief cough with thin liquids and hard solids.      Recommendations:  1. Dysphagia 3 with nectar thick liquids.  Pt may have ice chips.  2. Aspiration precautions:  Upright, single sips, slowed rate.  Pt okay to use straw, single sips.  3. SLP to see 2X/wk for diet tolerance and modifications.     09/26/10 1411   Referral Reason   Referral Reason Rule out aspiration   BEDSIDE SWALLOW COMMENTS   History of Dysphagia Comments Per Pt, chronic dysphagia s/p CVA in 2008.  Pt relates that she was on thickened liquids, however currently is not using them.   ASSESSMENT   Cognition Follows commands   Alertness level Fully alert   Ability to follow directions Complex   Head and neck control Independent   Secretion Management Pink, moist   Reflexive cough Adequate   Voluntary Cough Adequate   Reflexive swallow Present   Voluntary swallow Present   Respiratory Status RA   Communication Status No deficits identified   Oral Mechanism   Dentition Natural dentition;Some missing teeth;Poor dental/oral hygeine   PO TRIAL CONSISTENCIES   Oral Ice chips Not impaired   Pharyngeal Ice Chips  Not impaired   Oral Thin Liquid Not impaired   Pharyngeal Thin Liquids Suspected pharyngeal stasis;Reflexive Cough (comment)   Oral Nectar Not impaired   Pharyngeal nectar Not impaired   Oral Puree Not impaired   Pharyngeal Puree Not impaired   Oral Soft Solid Prolonged Mastication   Pharyngeal Soft Solid Suspected pharyngeal stasis;Reflexive cough (comment)   Aspiration Precautions/Feeding Guidelines   Risk for Aspiration Mild   Feeding Guidelines Upright 90 degrees;Remain upright at least 30 minutes post meal;Small bites and sips;Slow rate;Chin tuck;Single sips   Impressions   Severity Mild   Type of Dysphagia Oropharyngeal dysphagia   Summary Pt presents with mild oropharyngeal dysphagia,  most likely exacerbated by compromised respiratory status.  Pt able to manage nectar thick liquids, puree, and soft solids most efficiently.  +Dry, brief cough with trials of thin liquids and hard dry solids, no change with voice quality.  Pt receptive to using chin tuck to facilitate safer swallow and reduce riskof aspiration.  Pt appropriate for modified diet.     Diet Recommendations   Diet Consistency Recommendation Dysphagia III   Liquids Recommendations Nectar thick liquids   Studies/Therapy Recommendations   Therapy Recommendations Dysphagia therapy   Frequency of Services 2-3x/wk   Objective Studies/ Recommendations  N/A

## 2010-09-26 NOTE — Progress Notes (Signed)
Patient did not have telemetry monitor on. Writer did see the order for telemetry, but the order showed [facility]) beside the order which is not a customary order for [facility].  Writer did not see an order to discontinue telemetry.The last note stated that the patient was to be monitored. Notified Esperanza Heir , who was the cross cover, that the monitor was not previously placed. Patient was placed on telemetry. Thomasene Lot, RN

## 2010-09-26 NOTE — Plan of Care (Signed)
Problem: Safety  Goal: Patient will remain free of falls  Outcome: Progressing towards goal  Calls appropriately. Steady gait when ambulating independently to bathroom.    Problem: Mobility  Goal: Patient's functional status is maintained or improved  Outcome: Progressing towards goal  Ambulates independently.

## 2010-09-27 LAB — CBC AND DIFFERENTIAL
Baso # K/uL: 0 10*3/uL (ref 0.0–0.1)
Basophil %: 0.2 % (ref 0.1–1.2)
Eos # K/uL: 0 10*3/uL (ref 0.0–0.4)
Eosinophil %: 0 % — ABNORMAL LOW (ref 0.7–5.8)
Hematocrit: 32 % — ABNORMAL LOW (ref 34–45)
Hemoglobin: 10.3 g/dL — ABNORMAL LOW (ref 11.2–15.7)
Lymph # K/uL: 1.4 10*3/uL (ref 1.2–3.7)
Lymphocyte %: 12.9 % — ABNORMAL LOW (ref 19.3–51.7)
MCV: 80 fL (ref 79–95)
Mono # K/uL: 0.6 10*3/uL (ref 0.2–0.9)
Monocyte %: 6.1 % (ref 4.7–12.5)
Neut # K/uL: 8.4 10*3/uL — ABNORMAL HIGH (ref 1.6–6.1)
Nucl RBC %: 0 /100 WBC (ref 0.0–0.2)
Platelets: 266 10*3/uL (ref 160–370)
RBC: 4 MIL/uL (ref 3.9–5.2)
RDW: 17.1 % — ABNORMAL HIGH (ref 11.7–14.4)
Seg Neut %: 79.9 % — ABNORMAL HIGH (ref 34.0–71.1)
WBC: 10.5 10*3/uL — ABNORMAL HIGH (ref 4.0–10.0)

## 2010-09-27 LAB — BASIC METABOLIC PANEL
Anion Gap: 14 (ref 7–16)
CO2: 27 mmol/L (ref 20–28)
Calcium: 9.1 mg/dL (ref 8.6–10.2)
Chloride: 94 mmol/L — ABNORMAL LOW (ref 96–108)
Creatinine: 1.47 mg/dL — ABNORMAL HIGH (ref 0.51–0.95)
GFR,Black: 45 * — AB
GFR,Caucasian: 37 * — AB
Glucose: 408 mg/dL — ABNORMAL HIGH (ref 60–99)
Lab: 29 mg/dL — ABNORMAL HIGH (ref 6–20)
Potassium: 4.8 mmol/L (ref 3.3–5.1)
Sodium: 135 mmol/L (ref 133–145)

## 2010-09-27 LAB — POCT GLUCOSE
Glucose POCT: 438 mg/dL — ABNORMAL HIGH (ref 60–99)
Glucose POCT: 448 mg/dL — ABNORMAL HIGH (ref 60–99)
Glucose POCT: 289 mg/dL — ABNORMAL HIGH (ref 60–99)
Glucose POCT: 316 mg/dL — ABNORMAL HIGH (ref 60–99)
Glucose POCT: 381 mg/dL — ABNORMAL HIGH (ref 60–99)
Glucose POCT: 397 mg/dL — ABNORMAL HIGH (ref 60–99)

## 2010-09-27 MED ORDER — INSULIN LISPRO (HUMAN) 100 UNIT/ML IJ/SC SOLN *WRAPPED*
20.0000 [IU] | Freq: Once | SUBCUTANEOUS | Status: AC
Start: 2010-09-27 — End: 2010-09-27
  Administered 2010-09-27: 20 [IU] via SUBCUTANEOUS

## 2010-09-27 MED ORDER — DEXTROSE 50 % IV SOLN *I*
25.0000 g | INTRAVENOUS | Status: DC | PRN
Start: 2010-09-27 — End: 2010-09-28

## 2010-09-27 MED ORDER — GLUCOSE 40 % PO GEL *I*
15.0000 g | ORAL | Status: DC | PRN
Start: 2010-09-27 — End: 2010-09-28

## 2010-09-27 MED ORDER — INSULIN LISPRO (HUMAN) 100 UNIT/ML IJ/SC SOLN *WRAPPED*
0.0000 [IU] | Freq: Three times a day (TID) | SUBCUTANEOUS | Status: DC
Start: 2010-09-27 — End: 2010-09-28
  Administered 2010-09-27: 6 [IU] via SUBCUTANEOUS
  Administered 2010-09-27: 4 [IU] via SUBCUTANEOUS

## 2010-09-27 MED ORDER — INSULIN LISPRO (HUMAN) 100 UNIT/ML IJ/SC SOLN *WRAPPED*
25.0000 [IU] | Freq: Once | SUBCUTANEOUS | Status: AC
Start: 2010-09-27 — End: 2010-09-27
  Administered 2010-09-27: 25 [IU] via SUBCUTANEOUS

## 2010-09-27 MED ORDER — PANTOPRAZOLE SODIUM 40 MG PO TBEC *I*
40.0000 mg | DELAYED_RELEASE_TABLET | Freq: Every morning | ORAL | Status: DC
Start: 2010-09-27 — End: 2010-09-27

## 2010-09-27 MED ORDER — PREDNISONE 20 MG PO TABS *I*
30.0000 mg | ORAL_TABLET | Freq: Every day | ORAL | Status: DC
Start: 2010-09-27 — End: 2010-09-28
  Administered 2010-09-27 – 2010-09-28 (×2): 30 mg via ORAL
  Filled 2010-09-27 (×4): qty 1

## 2010-09-27 MED ORDER — GLUCAGON HCL (RDNA) 1 MG IJ SOLR *WRAPPED*
1.0000 mg | INTRAMUSCULAR | Status: DC | PRN
Start: 2010-09-27 — End: 2010-09-28

## 2010-09-27 MED ORDER — PANTOPRAZOLE SODIUM 40 MG PO TBEC *I*
40.0000 mg | DELAYED_RELEASE_TABLET | Freq: Every morning | ORAL | Status: DC
Start: 2010-09-27 — End: 2010-09-28
  Administered 2010-09-27 – 2010-09-28 (×2): 40 mg via ORAL
  Filled 2010-09-27: qty 1

## 2010-09-27 MED ORDER — NEBULIZER DEVICE *A*
Status: DC
Start: 2010-09-27 — End: 2012-07-17

## 2010-09-27 MED ORDER — INSULIN GLARGINE 100 UNIT/ML SC SOLN *WRAPPED*
50.0000 [IU] | Freq: Two times a day (BID) | SUBCUTANEOUS | Status: DC
Start: 2010-09-27 — End: 2010-09-28
  Administered 2010-09-27 – 2010-09-28 (×3): 50 [IU] via SUBCUTANEOUS

## 2010-09-27 NOTE — Progress Notes (Signed)
Subjective:   Feels much better.  Less dyspneic.  Patient is hard to wake up in the mornings. Asking for Ambien on waking up.  BGs continue to be > 400 mg%    Intake/Output    Intake/Output Summary (Last 24 hours) at 09/27/10 0839  Last data filed at 09/26/10 1320   Gross per 24 hour   Intake    240 ml   Output      0 ml   Net    240 ml        Vital Signs:   Temp:  [36.4 C (97.5 F)-36.6 C (97.9 F)] 36.6 C (97.9 F)  Heart Rate:  [84-92] 84   Resp:  [20-24] 20   BP: (120-146)/(82-102) 133/82 mmHg     Lungs: Auscultation of the lungs bilateral expiratory wheezes.    Cardiovascular: There was a regular rate and rhythm without any murmurs, gallops, rubs.     Abdomen: Soft and nontender with normal bowel sounds.     Neurologic: Somnolent but arousable    Extremitits: No cyanosis, clubbing or edema    Data:                  Lab 09/27/10 0259 09/26/10 0619 09/25/10 0625   WBC 10.5* 11.9* 14.9*   HGB 10.3* 10.5* 10.3*   HCT 32* 33* 32*   PLT 266 267 256   INR -- -- --     Other Labs:             Lab 09/27/10 0259 09/26/10 0619 09/25/10 0625   NA 135 136 135   K 4.8 4.6 4.7   CL 94* 98 99   CO2 27 25 25               X-rays the past 24 hours: No results found.       Active Meds:       . predniSONE  30 mg Oral Daily   . pantoprazole  40 mg Oral QAM   . insulin glargine  50 Units Subcutaneous Q12H SCH   . amLODIPine  10 mg Oral Daily   . moxifloxacin  400 mg Oral Daily   . aspirin  325 mg Oral Daily   . clopidogrel  75 mg Oral Daily   . DULoxetine  60 mg Oral Daily   . ipratropium-albuterol  3 mL Nebulization Q4H While awake   . triamterene-hydrochlorothiazide  1 tablet Oral QAM   . buPROPion  200 mg Oral Q12H SCH   . docusate sodium  100 mg Oral Q12H SCH   . rosuvastatin  10 mg Oral Daily with dinner   . insulin lispro  0-12 Units Subcutaneous TID AC   . nicotine  1 patch Transdermal Daily   . nicotine   Transdermal Daily   . pantoprazole  40 mg Oral QAM     Active Hospital Problems   Diagnoses   .  Marland Kitchen*COPD exacerbation       Assessment and Plan:     Assessment and Plan:     COPD exacerbation:   Patient's respiratory status and work of breathing have both improved significantly.   There is less expiratory wheezing that she continues to be significantly dyspneic on minimal exertion   D/C IV Steroids and start PO prednisone 30 mg/ day  Reduce by 10 mg very 3 days.    DIABETES:   Bgs continue to be very high.  Increase lantus Insulin to 50 Units BID.  BGs should  improve with discontinuation of IV steroids.      RENAL:   Renal functions now static. This may be her new baseline.  Recheck SMA8 in AM    Anticipate d/c to home tomorrow AM.    Will need a PCP in the community.      Author: Rudolpho Sevin, MD  as of: 09/27/2010  at: 8:39 AM

## 2010-09-27 NOTE — Significant Event (Signed)
Hyperglycemia to 460 overnight.    Paged twice re: POCT BGs > 400.  BG 460 at 2100, gave extra 15 units lantus.  BG 448 at 0300; pt did drink some cranberry juice around bedtime but nothing in couple hours before this check.  Confirmed with nurse that pt received lantus 40 units prior to bedtime, that pt received lispro 15 units overnight, that lispro is being given in abdomen which is well perfused, not edematous.  Ordering additional lispro 25 units now.    Of note, pt received extra 15 units lispro at 0500 7/23 which resulted in improved  AM 0700 BG (319) which team may have used in deciding to not increase SSI yesterday.  Pt's SSI of 1-4 units for BG 121 to >345 does not appear to be controlling BGs despite being on higher dose of lantus (40 units BID.)  Would encourage primary team to adjust daytime SSI up significantly given need for 40 units overnight for BGs >400.    Esperanza Heir, MD 09/27/2010 3:24 AM

## 2010-09-27 NOTE — Progress Notes (Signed)
I sent [facility],   Out pt pharmacy  Pt medicaid # and  Address in [place] [address] [place] [address]  Phone  Was [phone] .   He is  Not sure he can bill medicaid  From another state  But  He  Will look into it.     [place] [facility] [facility] and  They  Both stated  They  CAN NOT  Bill  Out of state.  Clemens Catholic BSN  Care Coordinator  [id number]

## 2010-09-27 NOTE — Progress Notes (Signed)
Pt  Medicaid is  From out of state.[facility] can not  Take  Sw aware.  Clemens Catholic BSN  Care Coordinator  [id number]

## 2010-09-27 NOTE — Progress Notes (Signed)
[facility] was referred for homecare I called our billing dept and was told we do not take out of state medicaid.Rosalita Chessman CM was notified

## 2010-09-27 NOTE — Progress Notes (Signed)
No  02 vender will take out of state  Medicaid  And pt stated she can not afford the out of pocket cost of the  02  $300 a month  For a concentrator and  About $60   For a portable 02 tank.  Also nebulizer will not  Be covered.  She also does not  Have a bg machine  I can give her one   From here.  Meds  Will be a problem as she states she left all her meds in [place]. And none of her meds will be  Covered in [facility] Coordinator  [id number]

## 2010-09-27 NOTE — Progress Notes (Signed)
Pt choice to [facility]  Dixie Dials  From [facility] is aware SW aware .paper choice sign in chart.  Clemens Catholic BSN  Care Coordinator  [phone]

## 2010-09-28 LAB — POCT GLUCOSE
Glucose POCT: 299 mg/dL — ABNORMAL HIGH (ref 60–99)
Glucose POCT: 208 mg/dL — ABNORMAL HIGH (ref 60–99)
Glucose POCT: 205 mg/dL — ABNORMAL HIGH (ref 60–99)

## 2010-09-28 MED ORDER — INSULIN LISPRO (HUMAN) 100 UNIT/ML IJ/SC SOLN *WRAPPED*
0.0000 [IU] | Freq: Three times a day (TID) | SUBCUTANEOUS | Status: DC
Start: 2010-09-28 — End: 2010-09-28
  Administered 2010-09-28: 4 [IU] via SUBCUTANEOUS

## 2010-09-28 MED ORDER — NICOTINE 7 MG/24HR TD PT24 *I*
1.0000 | MEDICATED_PATCH | Freq: Every day | TRANSDERMAL | Status: AC
Start: 2010-09-28 — End: 2010-10-28

## 2010-09-28 MED ORDER — INSULIN SYRINGE-NEEDLE U-100 28G X 1" 0.3 ML MISC
Status: DC
Start: 2010-09-28 — End: 2010-11-10

## 2010-09-28 MED ORDER — LANCETS 28G MISC
Status: DC
Start: 2010-09-28 — End: 2010-11-16

## 2010-09-28 MED ORDER — CLOPIDOGREL BISULFATE 75 MG PO TABS *I*
75.0000 mg | ORAL_TABLET | Freq: Every day | ORAL | Status: AC
Start: 2010-09-28 — End: 2010-10-28

## 2010-09-28 MED ORDER — ALBUTEROL SULFATE HFA 108 (90 BASE) MCG/ACT IN AERS *I*
1.0000 | INHALATION_SPRAY | Freq: Four times a day (QID) | RESPIRATORY_TRACT | Status: AC | PRN
Start: 2010-09-28 — End: 2010-10-28

## 2010-09-28 MED ORDER — PREDNISONE 10 MG PO TABS *I*
ORAL_TABLET | ORAL | Status: DC
Start: 2010-09-28 — End: 2010-11-10

## 2010-09-28 MED ORDER — GLUCAGON HCL (RDNA) 1 MG IJ SOLR *WRAPPED*
1.0000 mg | INTRAMUSCULAR | Status: DC | PRN
Start: 2010-09-28 — End: 2010-09-28

## 2010-09-28 MED ORDER — GLUCOSE BLOOD VI STRP *A*
ORAL_STRIP | Status: DC
Start: 2010-09-28 — End: 2010-11-10

## 2010-09-28 MED ORDER — AZITHROMYCIN 500 MG PO TABS *I*
500.0000 mg | ORAL_TABLET | Freq: Every day | ORAL | Status: AC
Start: 2010-09-28 — End: 2010-10-02

## 2010-09-28 MED ORDER — NICOTINE 7 MG/24HR TD PT24 *I*
1.0000 | MEDICATED_PATCH | Freq: Every day | TRANSDERMAL | Status: DC
Start: 2010-09-28 — End: 2010-09-28

## 2010-09-28 MED ORDER — INSULIN LISPRO (HUMAN) 100 UNIT/ML IJ/SC SOLN *WRAPPED*
15.0000 [IU] | Freq: Once | SUBCUTANEOUS | Status: AC
Start: 2010-09-28 — End: 2010-09-28
  Administered 2010-09-28: 15 [IU] via SUBCUTANEOUS

## 2010-09-28 MED ORDER — AMLODIPINE BESYLATE 5 MG PO TABS *I*
10.0000 mg | ORAL_TABLET | Freq: Every day | ORAL | Status: DC
Start: 2010-09-28 — End: 2010-09-28
  Administered 2010-09-28: 10 mg via ORAL
  Filled 2010-09-28: qty 2

## 2010-09-28 MED ORDER — DEXTROSE 50 % IV SOLN *I*
25.0000 g | INTRAVENOUS | Status: DC | PRN
Start: 2010-09-28 — End: 2010-09-28

## 2010-09-28 MED ORDER — AMLODIPINE BESYLATE 10 MG PO TABS *I*
10.0000 mg | ORAL_TABLET | Freq: Every day | ORAL | Status: AC
Start: 2010-09-28 — End: 2010-10-28

## 2010-09-28 MED ORDER — GLUCOSE 40 % PO GEL *I*
15.0000 g | ORAL | Status: DC | PRN
Start: 2010-09-28 — End: 2010-09-28

## 2010-09-28 MED ORDER — IPRATROPIUM BROMIDE HFA 17 MCG/ACT IN AERS *I*
2.0000 | INHALATION_SPRAY | Freq: Four times a day (QID) | RESPIRATORY_TRACT | Status: AC
Start: 2010-09-28 — End: 2010-10-28

## 2010-09-28 MED ORDER — TRUETRACK BLOOD GLUCOSE W/DEVICE KIT
PACK | Status: DC
Start: 2010-09-28 — End: 2010-11-10

## 2010-09-28 MED ORDER — AEROCHAMBER Z-STAT PLUS/SMALL MISC *A*
Status: DC
Start: 2010-09-28 — End: 2010-12-07

## 2010-09-28 MED ORDER — TRIAMTERENE-HCTZ 37.5-25 MG PO TABS *I*
1.0000 | ORAL_TABLET | Freq: Every morning | ORAL | Status: AC
Start: 2010-09-28 — End: 2010-10-28

## 2010-09-28 MED ORDER — INSULIN GLARGINE 100 UNIT/ML SC SOLN *WRAPPED*
50.0000 [IU] | Freq: Two times a day (BID) | SUBCUTANEOUS | Status: AC
Start: 2010-09-28 — End: 2010-10-28

## 2010-09-28 NOTE — Plan of Care (Signed)
Problem: Safety  Goal: Patient will remain free of falls  Outcome: Maintaining  Independent in room    Problem: Mobility  Goal: Patient's functional status is maintained or improved  Outcome: Maintaining  Independent

## 2010-09-28 NOTE — Progress Notes (Signed)
Subjective:     Patient's respiratory status has improved significantly  She still slightly wheezy but is able to and delayed without getting hypoxic.  The patient is looking forward to be discharge to home today.  She does report some mild expectorant cough.    Intake/Output    Intake/Output Summary (Last 24 hours) at 09/28/10 1142  Last data filed at 09/27/10 1730   Gross per 24 hour   Intake    480 ml   Output      0 ml   Net    480 ml        Vital Signs:   Temp:  [36.4 C (97.5 F)-36.9 C (98.4 F)] 36.4 C (97.5 F)  Heart Rate:  [82-92] 90   Resp:  [18-22] 18   BP: (135-145)/(73-80) 138/76 mmHg     Lungs: Auscultation of the lungs revealed bilateral expiratory wheezing    Cardiovascular: There was a regular rate and rhythm without any murmurs, gallops, rubs. T    Abdomen: Soft and nontender with normal bowel sounds. T    Neurologic: Alert and oriented x 3.     Extremitits: No cyanosis, clubbing or edema    Data:                  Lab 09/27/10 0259 09/26/10 0619 09/25/10 0625   WBC 10.5* 11.9* 14.9*   HGB 10.3* 10.5* 10.3*   HCT 32* 33* 32*   PLT 266 267 256   INR -- -- --     Other Labs:             Lab 09/27/10 0259 09/26/10 0619 09/25/10 0625   NA 135 136 135   K 4.8 4.6 4.7   CL 94* 98 99   CO2 27 25 25               X-rays the past 24 hours: No results found.       Lab 09/27/10 0259 09/26/10 0619 09/25/10 0625 09/24/10 2230 09/24/10 0648   NA 135 136 135 136 136   K 4.8 4.6 4.7 4.2 4.5   CL 94* 98 99 98 95*   CO2 27 25 25 21  19*   UN 29* 25* 28* 28* 27*   CREAT 1.47* 1.19* 1.80* 2.04* 2.05*   ALK -- -- -- -- --   TB -- -- -- -- --   ALT -- -- -- -- --   AST -- -- -- -- --   INR -- -- -- -- --       Active Meds:       . amLODIPine  10 mg Oral Daily   . insulin lispro  0-30 Units Subcutaneous TID AC   . predniSONE  30 mg Oral Daily   . insulin glargine  50 Units Subcutaneous Q12H SCH   . pantoprazole  40 mg Oral QAM   . moxifloxacin  400 mg Oral Daily   . aspirin  325 mg Oral Daily   .  clopidogrel  75 mg Oral Daily   . DULoxetine  60 mg Oral Daily   . ipratropium-albuterol  3 mL Nebulization Q4H While awake   . triamterene-hydrochlorothiazide  1 tablet Oral QAM   . buPROPion  200 mg Oral Q12H SCH   . docusate sodium  100 mg Oral Q12H SCH   . rosuvastatin  10 mg Oral Daily with dinner   . nicotine  1 patch Transdermal Daily   . nicotine  Transdermal Daily     Active Hospital Problems   Diagnoses   . Marland Kitchen*COPD exacerbation       Assessment and Plan:     COPD exacerbation:   The patient's respiratory status has improved significantly  The patient states that her nebulizer is not working.  A complicating factor is the fact that the patient's Medicaid is not active in [place] state  We will be prescribing metered dose inhalers as well as a spacer device for now  The patient has most of her medications at home.  She, however, needs a new glucometer as well as prescriptions for long and short-acting  Insulin    .     DIABETES:   The patient's blood glucose is now much better controlled.  Her BGs are now in the low 200's  She will be discharge to home. On her present dos of Lantus Insulin 50 Units b.i.d. As well as the Humalog insulin with meals    RENAL:   Patient's renal functions have improved to some extent and these will have to be followed up as an outpatient.    The patient does not have that PCP in the community and she has been referred to the resident clinical practice at [facility].    I'm also convinced that the patient has obstructive sleep apnea and she will require formal sleep study as an outpatient.    The patient will be given a prescription for Azithromycin to 50 mg for four more days        Author: Rudolpho Sevin, MD  as of: 09/28/2010  at: 11:42 AM

## 2010-09-28 NOTE — Progress Notes (Signed)
Transition Health Coach reviewed medical chart and patient appears to be appropriate for the Transition Health Coaching Program. This does not exclude the patient from being referred for traditional Home Care. Will attempt to see in the hospital. Writer received referral from [facility] and [facility] Care Coordinator to work with patient. Writer will see patient approx 12:00pm. Thank you for the referral. Any concerns [phone].  Bessie Boyte Karie Schwalbe, LMSW  Transitions Health Coach

## 2010-09-28 NOTE — Discharge Summary (Signed)
Discharge Summary       Admit date: 09/25/2010         Discharge date and time: 09/28/2010 10:31 AM  Admitting Physician: Rudolpho Sevin, MD   Discharge Attending: Dr. Tasia Catchings    Patient: Gloria Ray Age: 55 y.o. Date of Birth: [birth year] [id number]    Chief Complaint: shortness of breath   Principal Problem: COPD exacerbation    Details of Admission: Mrs Rix is a 54yom with pmhx of DMII, HTN, COPD, CAD with hx of PCI, p/w dyspnea, chest tightness, worsening x2 days, pt reports she is out of her inhalers at home and has had worsening difficulty breathing, she denies cough, nausea, emesis, fever, chills, shakes, syncope, sick contacts, or medication changes.   She has recently moved to [place] from [place] and has been seen in the Ed for COPD exacerbation twice.   Feeling better, but still very dyspneic.     Discharge Diagnoses:  Active Hospital Problems   Diagnoses   . COPD exacerbation      Resolved Hospital Problems   Diagnoses         Hospital Course (including key diagnostic test results):  Patient was admitted to [facility]. CXR was negative for infiltrates. She was afebrile. She was treated with IV Solumedrol, oxygen and nebulizers for COPD exacerbation. She was placed on telemetry monitoring with no acute events noted. Moxifloxacin was added for possible secondary infection. She was noted to have episodes of desaturation into the 70's and required O2 therapy. She eventually was weaned off O2 to room air with saturations in the 90's. She likely also has sleep apnea and will need outpatient sleep study. She will be placed on a tapering dose of steroids at discharge. She will complete 4 more days of antibiotic therapy with azithromycin.    Patient does not have her medications with her from [place].  She has no Medicaid in [place].  She was given prescriptions for her essential medications.  She will use inhalers until nebulizer machine is obtained.      BGs were elevated, likely due to steroids.   Lantus was titrated up to 50 units bid.  She will need to continue with glucose monitoring and was provided with a machine and test strips.  Lantus may need to be adjusted as she is weaned off the steroids.    Patient had acute kidney injury which improved with hydration.  Creatinine trended down.  She had hypokalemia which resolved with supplementation.  Chemistries should be followed until stable to establish baseline creatinine.    Patient had an episode of somnolence felt to be related to OSA in combination of Ambien and Trazodone. Ambien and Trazodone doses were reduced in half with improvement in her symptoms.    Follow up with PCP Dr. Daylene Posey 10/26/10.      Key Exam Findings at Discharge:    Vitals: Blood pressure 138/76, pulse 82, temperature 36.8 C (98.2 F), temperature source Temporal, resp. rate 18, height 1.626 m (5\' 4" ), weight 113.399 kg (250 lb), SpO2 96.00%.    Admission Weight: Weight: 113.399 kg (250 lb)  Discharge Weight: Weight: 113.399 kg (250 lb)       Pending Test Results: none    Consulting Providers: none    Discharged Condition: good    Discharge medications, instructions, and follow-up plans: as per After Visit Summary  Disposition: Home       Signed: Dagmar Hait, PA  On: 09/28/2010  at: 10:31 AM

## 2010-09-28 NOTE — Progress Notes (Signed)
Alert, oriented, verbal, steady gait, denies pain, skin intact.  Discharged to daughter

## 2010-09-28 NOTE — Progress Notes (Signed)
Patient provided with spacer for use with MDI's and was taught on proper usage of inhalers with spacer.  Gloria Ray Dailyn Kempner, RRT/RT

## 2010-09-28 NOTE — Discharge Instructions (Addendum)
Active Hospital Problems   Diagnoses   . COPD exacerbation      Resolved Hospital Problems   Diagnoses     Brief Summary of Your Hospital Course (including key procedures and diagnostic test results):  You were admitted due to shortness of breath.  You were found to have COPD exacerbation with possible bronchitis and were treated with oxygen, steroids, nebulizers and antibiotics.   Your blood sugars were high, probably due to steroids and your Lantus was increased. You will complete a course of antibiotics.  You will need to finish your steroid taper.      Your instructions:  Continue your medications as prescribed.  Continue to check your blood sugars 4 times a day.  Use your inhalers with a spacer until nebulizer machine obtained.    What to do after you leave the hospital:  Follow up with your primary care provider in 1 week.    Recommended diet: diabetic diet    Recommended activity: activity as tolerated    Wound Care: none needed    If you experience any of these symptoms within the first 24 hours after discharge:Chest pain, Shortness of breath or Fever of 101 F. or greater  please follow up with the discharge attending Dr. Tasia Catchings at phone-number: [phone]    If you experience any of these symptoms 24 hours or more after discharge:Chest pain, Shortness of breath or Fever of 101 F. or greater  please follow up with your PCP:   Gloria Ray [phone]

## 2010-09-28 NOTE — Progress Notes (Signed)
-  I spoke to [facility]   And they are able to put in a transitional coach under a [employer].    - Pt states she has a nebulizer machine at home  But it is  Broken, I called  Pt dtg and  She read me the  Label on the machine . It is  From [facility]  Special  From [place] and there is a phone  #.  I instructed dtg to call that  # and  Tell them it is  Broken and  See if they can send her a new one.  I did tell pt and dtg  We can get her  A new nebulizer here in [place] but the  Cost  Out of  Her pocket is  $169.10  From [place] ( they do not have any  Used ones). Both pt and  dtg  Stated they can not  Afford  This. MD aware and will send  Home   With  Inhalers and a spacer.    - Pt will stay with dtg at [facility].   [place] [address]   No house  Phone  But  dtg cell is  [phone] ( dtg is Marianna Fuss)    - dtg will pick up later  Today  And I am  Working on getting pt meds filled here at [facility] under charity care.  Clemens Catholic BSN  Care Coordinator  [phone]

## 2010-10-04 LAB — EKG 12-LEAD
P: 55 degrees
QRS: -3 degrees
Rate: 83 {beats}/min
Severity: BORDERLINE
Severity: BORDERLINE
Statement: BORDERLINE
T: 65 degrees

## 2010-10-26 ENCOUNTER — Ambulatory Visit: Payer: Self-pay | Admitting: Geriatric Medicine

## 2010-11-04 ENCOUNTER — Ambulatory Visit: Payer: Self-pay | Admitting: Family Medicine

## 2010-11-10 ENCOUNTER — Ambulatory Visit: Payer: Self-pay | Admitting: Family Medicine

## 2010-11-10 ENCOUNTER — Telehealth: Payer: Self-pay

## 2010-11-10 ENCOUNTER — Encounter: Payer: Self-pay | Admitting: Family Medicine

## 2010-11-10 VITALS — BP 142/95 | Temp 98.2°F | Ht >= 80 in | Wt 220.0 lb

## 2010-11-10 DIAGNOSIS — K029 Dental caries, unspecified: Secondary | ICD-10-CM

## 2010-11-10 DIAGNOSIS — J441 Chronic obstructive pulmonary disease with (acute) exacerbation: Secondary | ICD-10-CM

## 2010-11-10 DIAGNOSIS — K219 Gastro-esophageal reflux disease without esophagitis: Secondary | ICD-10-CM

## 2010-11-10 DIAGNOSIS — E119 Type 2 diabetes mellitus without complications: Secondary | ICD-10-CM

## 2010-11-10 DIAGNOSIS — E785 Hyperlipidemia, unspecified: Secondary | ICD-10-CM

## 2010-11-10 DIAGNOSIS — I10 Essential (primary) hypertension: Secondary | ICD-10-CM

## 2010-11-10 DIAGNOSIS — F419 Anxiety disorder, unspecified: Secondary | ICD-10-CM | POA: Insufficient documentation

## 2010-11-10 DIAGNOSIS — F32A Depression, unspecified: Secondary | ICD-10-CM

## 2010-11-10 MED ORDER — DOCUSATE SODIUM 100 MG PO CAPS *I*
100.0000 mg | ORAL_CAPSULE | Freq: Two times a day (BID) | ORAL | Status: DC
Start: 2010-11-10 — End: 2011-02-13

## 2010-11-10 MED ORDER — ALBUTEROL SULFATE (2.5 MG/3ML) 0.083% IN NEBU *I*
2.5000 mg | INHALATION_SOLUTION | RESPIRATORY_TRACT | Status: DC | PRN
Start: 2010-11-10 — End: 2011-03-29

## 2010-11-10 MED ORDER — INSULIN PEN NEEDLE 31G X 5 MM MISC *A*
Status: DC
Start: 2010-11-10 — End: 2011-05-29

## 2010-11-10 MED ORDER — TRUETRACK BLOOD GLUCOSE W/DEVICE KIT
PACK | Status: DC
Start: 2010-11-10 — End: 2010-11-15

## 2010-11-10 MED ORDER — MELOXICAM 7.5 MG PO TABS *I*
7.5000 mg | ORAL_TABLET | Freq: Two times a day (BID) | ORAL | Status: DC | PRN
Start: 2010-11-10 — End: 2011-01-11

## 2010-11-10 MED ORDER — ASPIRIN 325 MG PO TABS *I*
325.0000 mg | ORAL_TABLET | Freq: Every day | ORAL | Status: DC
Start: 2010-11-10 — End: 2011-03-29

## 2010-11-10 MED ORDER — ROSUVASTATIN CALCIUM 10 MG PO TABS *I*
10.0000 mg | ORAL_TABLET | Freq: Every day | ORAL | Status: DC
Start: 2010-11-10 — End: 2011-05-29

## 2010-11-10 MED ORDER — TRAZODONE HCL 100 MG PO TABS *I*
ORAL_TABLET | ORAL | Status: DC
Start: 2010-11-10 — End: 2011-02-13

## 2010-11-10 MED ORDER — DULOXETINE HCL 60 MG PO CPEP *I*
60.0000 mg | DELAYED_RELEASE_CAPSULE | Freq: Every day | ORAL | Status: DC
Start: 2010-11-10 — End: 2011-01-11

## 2010-11-10 MED ORDER — INSULIN GLARGINE 100 UNIT/ML SC SOLN PEN *A*
50.0000 [IU] | Freq: Two times a day (BID) | SUBCUTANEOUS | Status: AC
Start: 2010-11-10 — End: 2010-12-10

## 2010-11-10 MED ORDER — OMEPRAZOLE 20 MG PO CPDR *I*
DELAYED_RELEASE_CAPSULE | ORAL | Status: DC
Start: 2010-11-10 — End: 2011-01-05

## 2010-11-10 MED ORDER — BUPROPION HCL 100 MG PO TB12 *I*
100.0000 mg | ORAL_TABLET | Freq: Two times a day (BID) | ORAL | Status: DC
Start: 2010-11-10 — End: 2011-02-13

## 2010-11-10 MED ORDER — AMLODIPINE BESYLATE 10 MG PO TABS *I*
10.0000 mg | ORAL_TABLET | Freq: Every day | ORAL | Status: DC
Start: 2010-11-10 — End: 2011-03-29

## 2010-11-10 MED ORDER — TRUETRACK BLOOD GLUCOSE W/DEVICE KIT
PACK | Status: DC
Start: 2010-11-10 — End: 2010-11-10

## 2010-11-10 MED ORDER — PREDNISONE 10 MG PO TABS *I*
ORAL_TABLET | ORAL | Status: DC
Start: 2010-11-10 — End: 2011-03-29

## 2010-11-10 MED ORDER — GLUCOSE BLOOD VI STRP *A*
ORAL_STRIP | Status: DC
Start: 2010-11-10 — End: 2010-11-15

## 2010-11-10 MED ORDER — INSULIN LISPRO (HUMAN) 100 UNIT/ML IJ/SC SOLN *WRAPPED*
30.0000 [IU] | Freq: Three times a day (TID) | SUBCUTANEOUS | Status: AC
Start: 2010-11-10 — End: 2010-12-10

## 2010-11-10 MED ORDER — IPRATROPIUM-ALBUTEROL 0.5-2.5 MG/3ML IN SOLN *I*
3.0000 mL | Freq: Four times a day (QID) | RESPIRATORY_TRACT | Status: AC
Start: 2010-11-10 — End: 2010-11-10
  Administered 2010-11-10: 3 mL via RESPIRATORY_TRACT

## 2010-11-10 MED ORDER — TRAMADOL HCL 50 MG PO TABS *I*
50.0000 mg | ORAL_TABLET | Freq: Four times a day (QID) | ORAL | Status: DC | PRN
Start: 2010-11-10 — End: 2011-02-13

## 2010-11-10 MED ORDER — ALBUTEROL SULFATE HFA 108 (90 BASE) MCG/ACT IN AERS *I*
2.0000 | INHALATION_SPRAY | RESPIRATORY_TRACT | Status: AC | PRN
Start: 2010-11-10 — End: 2010-12-10

## 2010-11-10 MED ORDER — FLUTICASONE-SALMETEROL 500-50 MCG/ACT IN AEPB *I*
INHALATION_SPRAY | RESPIRATORY_TRACT | Status: DC
Start: 2010-11-10 — End: 2011-03-29

## 2010-11-10 NOTE — Progress Notes (Signed)
Subjective:     Patient ID: Gloria Ray is a 55 y.o. female.    HPI  New patient to practice: moved from [place] to be with daughter and have better medical care.   Has been in [place] about 59month.   Hospitalized in July at [facility] for COPD exacerbation. Social work and [facility] referral at that time.   Given scripts but no insurance at that time. Given medications to go home with.   States she took last medication today. No further meds at home. Needs all new medications and new glucometer as "the kids broke it"     COPD: states she takes medications as prescribed: uses albuterol neb several times a day. Treated with antibiotics and steroid taper last month for exacerbation related to URI. Felt better for a while, now feeling ill again. Increased SOB, wheezing, coughing, DOE. Denies fever or chills.   DMII: states she takes 50 units of lantus BID and 30 units of humolog after each meal. Poor control. Has not checked glucoses recently. + increased thirst and increased urination.   GERD: reflux: uses prilosec with good relief. Denies bloody stools, abdominal pain. + increased loose stools lately.   HTN: taking medications as prescribed: denies cardiac type pain.     Dental caries: was to have teeth removed before she moved, but had to leave before it was done. Mouth with severe decay, states she was on percocet for the pain.       Patient's medications, allergies, past medical, surgical, social and family histories were reviewed and updated as appropriate.    Review of Systems  Per above.     Objective:   Physical Exam  BP 142/95  Temp(Src) 36.8 C (98.2 F) (Temporal)  Ht 2.565 m (8\' 5" )  Wt 99.791 kg (220 lb)  BMI 15.16 kg/m2  SpO2 96%  General appearance: obese, alert, oriented, speaking in full sentences, labored breathing, diaphoretic. Audible wheezes when entering room.   HEENT:PERRL, EOMI, TMs normal, oropharynx clear, moist, nearly all teeth with decay, front upper and lower teeth decayed/broken to  gum level.   NECK:Supple, no lymphadenopathy, thyroid symmetrical wit no lesion or nodules appreciated.   Chest: bilateral decreased breathsounds with scattered wheezes throughout, loose rhonchi upper airways clear with cough.   CVS exam: normal rate, regular rhythm, normal S1, S2, no murmurs, rubs, clicks or gallops.  Abdominal exam: soft, nontender, nondistended, no masses or organomegaly.  Exam of extremities: peripheral pulses normal, no pedal edema, no clubbing or cyanosis  Neurological: Alert and oriented x3, slow, steady  gait    Assessment/Plan:  1. COPD exacerbation:VSS> given duo neb with improved breathsounds, feeling better. Will repeat steroid taper. F/u next week. Medications renewed and sent to pharmacy.    2. DMII: uncontrolled: will evalute hga1c, cmp, lipids. Insulin and glucometer reordered. F/u 1 week or sooner.   3. MWN:[id number] current medications. Labs per order. F/u next week.   4. GERD: improved symtpoms, tolerating prilosec.   5. Dental caries: encoruaged to schedule with [facility]; refill tramadol for pain issues.   6. Depression: will need to address at next visit: refills provided.   7. Social/financial stressors:   Review of hospital note and records done.   All medications refilled. States [facility] referral from hospitalization was done: nurse came and filled mediset. Requesting transportation form to be completed. Complex social as well as medical situation, will refer to Arnold Long RN, case manager to sort out what was done at hospital and  what still needs to be done as outpatient for support services. Will also refer to social work for evaluation of financial situation: states she needs assistance with food/   >20 minutes spent face to face counseling/educating/reviewing records and coordinating care.

## 2010-11-10 NOTE — Telephone Encounter (Signed)
 Error

## 2010-11-15 ENCOUNTER — Telehealth: Payer: Self-pay

## 2010-11-15 MED ORDER — GLUCOSE BLOOD VI STRP *A*
ORAL_STRIP | Status: DC
Start: 2010-11-15 — End: 2010-11-16

## 2010-11-15 MED ORDER — ONETOUCH ULTRA 2 W/DEVICE KIT *A*
PACK | Status: DC
Start: 2010-11-15 — End: 2010-11-16

## 2010-11-15 NOTE — Telephone Encounter (Signed)
Make sure hard copies get printed, please.

## 2010-11-15 NOTE — Telephone Encounter (Signed)
rcvd script for trutest BG moniter. Pt ins doesn't cover that brand. Must be One Touch OR Freestyle. Also needs scripts for test strips for the brand ordered and lancets for the brand ordered. These e-scripts need to be followed with hard copies please!!

## 2010-11-16 ENCOUNTER — Other Ambulatory Visit: Payer: Self-pay | Admitting: Family Medicine

## 2010-11-16 MED ORDER — ONETOUCH ULTRASOFT LANCETS MISC *A*
Status: DC
Start: 2010-11-16 — End: 2010-11-16

## 2010-11-16 MED ORDER — GLUCOSE BLOOD VI STRP *A*
ORAL_STRIP | Status: DC
Start: 2010-11-16 — End: 2010-11-16

## 2010-11-16 MED ORDER — GLUCOSE BLOOD VI STRP *A*
ORAL_STRIP | Status: DC
Start: 2010-11-16 — End: 2011-01-28

## 2010-11-16 MED ORDER — ONETOUCH ULTRA 2 W/DEVICE KIT *A*
PACK | Status: DC
Start: 2010-11-16 — End: 2010-11-16

## 2010-11-16 MED ORDER — ONETOUCH ULTRA 2 W/DEVICE KIT *A*
PACK | Status: DC
Start: 2010-11-16 — End: 2011-11-21

## 2010-11-16 MED ORDER — ONETOUCH ULTRASOFT LANCETS MISC *A*
Status: DC
Start: 2010-11-16 — End: 2011-01-28

## 2010-11-16 NOTE — Telephone Encounter (Signed)
Dr Juliane Poot to come in and reprint scripts as they did not print on-site. They will then be put into RN's box for delivery to [facility]

## 2010-11-16 NOTE — Telephone Encounter (Signed)
[facility] is asking for the hard copies for the one touch ultra BG moniter and the test strips and also looking for a script for the lancets for the pt. They need these ASAP, please. Thank You!!!

## 2010-11-17 NOTE — Telephone Encounter (Signed)
3 hardcopy scripts hand delivered to [facility]

## 2010-11-23 ENCOUNTER — Ambulatory Visit: Payer: Self-pay | Admitting: Family Medicine

## 2010-11-25 ENCOUNTER — Other Ambulatory Visit: Payer: Self-pay | Admitting: Gastroenterology

## 2010-11-25 ENCOUNTER — Encounter: Payer: Self-pay | Admitting: Emergency Medicine

## 2010-11-25 ENCOUNTER — Emergency Department
Admit: 2010-11-25 | Disposition: A | Discharge status: Home / Self Care | Payer: Self-pay | Source: Ambulatory Visit | Attending: Emergency Medicine | Admitting: Emergency Medicine

## 2010-11-25 LAB — CBC AND DIFFERENTIAL
Baso # K/uL: 0 10*3/uL (ref 0.0–0.1)
Basophil %: 0.3 % (ref 0.1–1.2)
Eos # K/uL: 0.1 10*3/uL (ref 0.0–0.4)
Eosinophil %: 1 % (ref 0.7–5.8)
Hematocrit: 31 % — ABNORMAL LOW (ref 34–45)
Hemoglobin: 9.7 g/dL — ABNORMAL LOW (ref 11.2–15.7)
Lymph # K/uL: 3.8 10*3/uL — ABNORMAL HIGH (ref 1.2–3.7)
Lymphocyte %: 41.3 % (ref 19.3–51.7)
MCV: 83 fL (ref 79–95)
Mono # K/uL: 0.7 10*3/uL (ref 0.2–0.9)
Monocyte %: 7.8 % (ref 4.7–12.5)
Neut # K/uL: 4.5 10*3/uL (ref 1.6–6.1)
Platelets: 230 10*3/uL (ref 160–370)
RBC: 3.7 MIL/uL — ABNORMAL LOW (ref 3.9–5.2)
RDW: 17.4 % — ABNORMAL HIGH (ref 11.7–14.4)
Seg Neut %: 49.3 % (ref 34.0–71.1)
WBC: 9.1 10*3/uL (ref 4.0–10.0)

## 2010-11-25 LAB — BASIC METABOLIC PANEL
Anion Gap: 14 (ref 7–16)
CO2: 26 mmol/L (ref 20–28)
Calcium: 8.7 mg/dL (ref 8.6–10.2)
Chloride: 101 mmol/L (ref 96–108)
Creatinine: 1.11 mg/dL — ABNORMAL HIGH (ref 0.51–0.95)
GFR,Black: >59 *
GFR,Caucasian: 51 * — AB
Glucose: 190 mg/dL — ABNORMAL HIGH (ref 60–99)
Lab: 24 mg/dL — ABNORMAL HIGH (ref 6–20)
Potassium: 3.1 mmol/L — ABNORMAL LOW (ref 3.3–5.1)
Sodium: 141 mmol/L (ref 133–145)

## 2010-11-25 LAB — HEMOGLOBIN A1C: Hemoglobin A1C: 10.5 % — ABNORMAL HIGH (ref 4.0–6.0)

## 2010-11-25 LAB — BLOOD BANK HOLD LAVENDER

## 2010-11-25 LAB — HOLD GREEN WITH GEL

## 2010-11-25 LAB — HOLD RED

## 2010-11-25 LAB — HOLD BLUE

## 2010-11-25 MED ORDER — PREDNISONE 50 MG PO TABS *I*
50.0000 mg | ORAL_TABLET | Freq: Every day | ORAL | Status: AC
Start: 2010-11-25 — End: 2010-11-29

## 2010-11-25 MED ORDER — ALBUTEROL SULFATE (2.5 MG/3ML) 0.083% IN NEBU *I*
7.5000 mg | INHALATION_SOLUTION | RESPIRATORY_TRACT | Status: AC
Start: 2010-11-25 — End: 2010-11-25
  Administered 2010-11-25: 7.5 mg via RESPIRATORY_TRACT
  Filled 2010-11-25 (×2): qty 9

## 2010-11-25 MED ORDER — ACETAMINOPHEN 325 MG PO TABS *I*
650.0000 mg | ORAL_TABLET | Freq: Once | ORAL | Status: AC
Start: 2010-11-25 — End: 2010-11-25
  Administered 2010-11-25: 650 mg via ORAL
  Filled 2010-11-25: qty 2

## 2010-11-25 MED ORDER — IPRATROPIUM BROMIDE 0.02 % IN SOLN *I*
500.0000 ug | RESPIRATORY_TRACT | Status: AC
Start: 2010-11-25 — End: 2010-11-25
  Administered 2010-11-25: 0.5 mg via RESPIRATORY_TRACT
  Filled 2010-11-25: qty 2.5

## 2010-11-25 MED ORDER — PREDNISONE 20 MG PO TABS *I*
60.0000 mg | ORAL_TABLET | Freq: Once | ORAL | Status: AC
Start: 2010-11-25 — End: 2010-11-25
  Administered 2010-11-25: 60 mg via ORAL
  Filled 2010-11-25: qty 3

## 2010-11-25 MED ORDER — IPRATROPIUM-ALBUTEROL 0.5-2.5 MG/3ML IN SOLN *I*
3.0000 mL | Freq: Once | RESPIRATORY_TRACT | Status: AC
Start: 2010-11-25 — End: 2010-11-25
  Administered 2010-11-25: 3 mL via RESPIRATORY_TRACT
  Filled 2010-11-25: qty 3

## 2010-11-25 NOTE — ED Notes (Signed)
Patient requesting pain medication and stated "I want it IV, not by mouth."  Told patient I would check with doctor.

## 2010-11-25 NOTE — ED Provider Notes (Signed)
History     Chief Complaint   Patient presents with   . Shortness of Breath     HPI Comments: Pt non compliant with Advair and Singulair. Only uses her Albuterol neb three times per day.     Patient is a 55 y.o. female presenting with shortness of breath. The history is provided by the patient.   Shortness of Breath  This is a recurrent problem. The average episode lasts 6 hours. The problem occurs frequently.The current episode started 6 to 12 hours ago. The problem has been gradually worsening. Associated symptoms include wheezing. Pertinent negatives include no fever, no headaches, no coryza, no rhinorrhea, no sore throat, no swollen glands, no ear pain, no neck pain, no cough, no sputum production, no hemoptysis, no PND, no orthopnea, no chest pain, no syncope, no vomiting, no abdominal pain, no rash, no leg pain, no leg swelling and no claudication. The problem's precipitants include pollens and weather/humidity. She has tried inhaled steroids and beta-agonist inhalers for the symptoms. The treatment provided no relief. She has had prior hospitalizations. She has had prior ED visits. She has had no prior ICU admissions. Associated medical issues include COPD.       Past Medical History   Diagnosis Date   . Asthma    . COPD (chronic obstructive pulmonary disease)    . Coronary artery disease    . Diabetes mellitus    . Hypertension        Past Surgical History   Procedure Date   . Coronary angioplasty with stent placement        Family History   Problem Relation Age of Onset   . Diabetes Other    . Heart disease Other    . Hypertension Other    . Coronary art dis Other        Social History      reports that she has quit smoking. She does not have any smokeless tobacco history on file. She reports that she does not drink alcohol. Her drug and sexual activity histories not on file.    Living Situation     Questions Responses    Patient lives with Child    Homeless     Caregiver for other family member      External Services     Employment Retired    Domestic Violence Risk           Review of Systems   Review of Systems   Constitutional: Negative for fever.   HENT: Negative for ear pain, sore throat, rhinorrhea and neck pain.    Respiratory: Positive for shortness of breath and wheezing. Negative for cough, hemoptysis and sputum production.    Cardiovascular: Negative for chest pain, orthopnea, claudication, leg swelling, syncope and PND.   Gastrointestinal: Negative for vomiting and abdominal pain.   Skin: Negative for rash.   Neurological: Negative for headaches.   All other systems reviewed and are negative.        Physical Exam   BP 141/72  Temp 35.9 C (96.6 F)  Resp 24  Wt 104.327 kg (230 lb)  SpO2 97%    Physical Exam   Nursing note and vitals reviewed.  Constitutional: She is oriented to person, place, and time. She appears well-developed and well-nourished.   HENT:   Head: Normocephalic and atraumatic.   Nose: Nose normal.   Mouth/Throat: Oropharynx is clear and moist.   Eyes: Conjunctivae and EOM are normal. Pupils are equal, round, and  reactive to light.   Neck: Normal range of motion. Neck supple.   Cardiovascular: Normal rate, regular rhythm, normal heart sounds and intact distal pulses.    Pulmonary/Chest: Effort normal. No stridor. No respiratory distress. She has wheezes. She has no rales. She exhibits no tenderness.   Abdominal: Soft. Bowel sounds are normal. She exhibits no distension. There is no tenderness. There is no rebound and no guarding.   Musculoskeletal: Normal range of motion.   Neurological: She is alert and oriented to person, place, and time.   Skin: Skin is dry.   Psychiatric: She has a normal mood and affect. Her behavior is normal. Judgment and thought content normal.       Medical Decision Making   MDM  Number of Diagnoses or Management Options  Diagnosis management comments: Patient seen by me on arrival date of 11/25/2010 at the time of arrival 12:46 AM    Assessment:  55 y.o.,  female comes to the ED with wheezing  Differential Diagnosis includes copd exacerbation  Plan: stressed importance of inhaled steroids, po steroids, nebulizer, reassess.          Harrietta Guardian, MD    Harrietta Guardian, MD  11/25/10 [phone]

## 2010-11-25 NOTE — ED Notes (Signed)
Patient requesting graham crackers with peanut butter.  Given graham crackers.  Then requesting coffee.  Told patient that we do not give coffee to patients.  Then requesting soda.

## 2010-11-25 NOTE — ED Notes (Signed)
Portable chest xray done.

## 2010-11-25 NOTE — ED Notes (Signed)
Doctor aware of patient's request.  Hot pack to patient's chest  Per doctor order.

## 2010-11-25 NOTE — ED Notes (Signed)
Patient states that she has been short of breath x2 days.  Audible expiratory wheezing.  Speaking in short choppy sentences.  C/o left sided chest heaviness that she rates as 9.5/10.  Denies n/v/d, no numbness/ tingling, no diaphoresis.  No lower extremity edema.

## 2010-11-25 NOTE — ED Notes (Signed)
Went to room to discharge patient.  Patient states that she needs a Rx for nicotine patch.  Explained to patient  That she would need to get that from her PCP.  Then patient requesting pain medication for headache.  Told patient that the ER doctor would tell her to take Tylenol when she gets home.  Patient stated that she doesn't have Tylenol at home.  Then patient requesting a Rx for pain medication.  Explained to patient that any other medication that she wants, she will have to get from her PCP, and that this is an ER and she needs to get medications from her PCP so that he can follow her care.

## 2010-11-25 NOTE — ED Notes (Signed)
Pt started having trouble breathing tonight bilat exp wheezes and tightness and heaviness to chest, short of breath on exertion

## 2010-11-25 NOTE — Discharge Instructions (Signed)
You need to use your inhalers every day.    Take Advair twice daily.  Take Singulair ONCE daily.  Use albuterol nebulizer three times daily or up to every 4 hours.  Take Prednisone ONCE daily.    Continue insulin as before. The office will call you or you can call there if you are running high.

## 2010-11-25 NOTE — ED Notes (Signed)
Patient requested a sandwich.  When PCT took sandwich in to patient, patient stated that she wanted another sandwich, but this time she wanted it warmed up.  Explained to patient, no more sandwiches.

## 2010-11-25 NOTE — ED Notes (Signed)
Patient is resting comfortably. 

## 2010-12-05 LAB — EKG 12-LEAD
P: 66 degrees
QRS: 7 degrees
Rate: 93 {beats}/min
Severity: ABNORMAL
Severity: ABNORMAL
Statement: ABNORMAL
Statement: BORDERLINE
T: 117 degrees

## 2010-12-07 ENCOUNTER — Encounter: Payer: Self-pay | Admitting: Family Medicine

## 2010-12-07 ENCOUNTER — Ambulatory Visit: Payer: Self-pay | Admitting: Family Medicine

## 2010-12-07 VITALS — BP 137/93 | HR 100 | Temp 97.9°F | Wt 222.0 lb

## 2010-12-07 DIAGNOSIS — K029 Dental caries, unspecified: Secondary | ICD-10-CM

## 2010-12-07 DIAGNOSIS — E119 Type 2 diabetes mellitus without complications: Secondary | ICD-10-CM

## 2010-12-07 DIAGNOSIS — J45901 Unspecified asthma with (acute) exacerbation: Secondary | ICD-10-CM

## 2010-12-07 MED ORDER — ALBUTEROL SULFATE (2.5 MG/3ML) 0.083% IN NEBU *I*
2.5000 mg | INHALATION_SOLUTION | Freq: Once | RESPIRATORY_TRACT | Status: AC
Start: 2010-12-07 — End: 2010-12-07
  Administered 2010-12-07: 2.5 mg via RESPIRATORY_TRACT

## 2010-12-07 MED ORDER — AEROCHAMBER Z-STAT PLUS/SMALL MISC *A*
Status: DC
Start: 2010-12-07 — End: 2012-07-17

## 2010-12-07 MED ORDER — STARCH (THICKENING) PO PACK *A*
PACK | ORAL | Status: DC
Start: 2010-12-07 — End: 2011-04-18

## 2010-12-07 NOTE — Assessment & Plan Note (Signed)
A: Advanced tooth and gum disease. Aware of the problem, would like referral to dentistry.  P: Referred to [facility] dentistry.

## 2010-12-07 NOTE — Assessment & Plan Note (Signed)
A: Not interested in discussing this extensively, however does have a very appropriate interest in securing access to podiatry and optho services.  P: Continue treatment with current regimen until DM can be reassessed. Referrals to Podiatry, opthalmology.

## 2010-12-07 NOTE — Assessment & Plan Note (Signed)
A: Recent ED visit noted in chart. COPD exacerbation treated with nebulizers and steroid taper. Patient has limited tolerance for discussing this, but was treated with albuterol. She also does not have a spacer for inhalers, and does not appear to be timing them correctly to get the medicine into her lungs.   P:Albuterol neb in office. Prescribe spacer. Return to care with PCP within next couple of weeks for f/u COPD and DMII.

## 2010-12-07 NOTE — Progress Notes (Signed)
[facility] - Outpatient Progress Note    SUBJECTIVE    CC: Out of medicines, needs foot exam, needs dental referral    HPI:   1. Given medicines at previous visit. 1 month supply given. Her previous doctor in [place] required her to come in monthly for refills, so she assumed that she needed to come in to get her refills. I explained to her that the refills are available at the pharmacy already.    2. Diabetic patient with poor control, has not had foot exam recently and her toenails require trimming.     3. History of severe dental decay. Was scheduled to have dental surgery with tooth removal in [place], but moved away instead. Would like referral to dental school.     Also noted on chart review to have recently presented to ED for COPD exacerbation with nebulizer treatments and oral prednisone. Denies any concerns with this and initially refuses exam, saying she does not want to address that today. However, she is convinced by Dr. Arnette Norris to allow a quick lung exam and accept a nebulizer treatment with albuterol.     Refuses to discuss her diabetes treatment at this time.    States her knees hurt, but then is not interested in discussing this further or receiving treatment.    Reports she "aspirates" often, would like Thick-it prescription to try.    Still smoking 1-2 cigarettes a day, encouraged to quit.    History:   Patient Active Problem List   Diagnoses Code   . Coronary Artery Disease 414.00   . Cellulitis 682.9   . Prior Myocardial Infarction 412   . Hypertension 401.9   . Asthma With Acute Exacerbation 493.92   . Diabetes Mellitus 250.00   . Ischemic Stroke 434.90   . Chronic Obstructive Pulmonary Disease 496   . COPD exacerbation 491.21   . Dental caries 521.00   . Depression 311   Multiple active medical problems reviewed and correlated to prescriptions. Seems to be on a largely appropriate regimen, but with poor diabetic control based on elevated A1C.      1. Unspecified asthma, with exacerbation         ROS: as per HPI    Patient's medications, allergies, past medical, surgical, social and family histories were reviewed and updated as appropriate.    OBJECTIVE    Filed Vitals:    12/07/10 0949   BP: 137/93   Pulse: 100   Temp: 36.6 C (97.9 F)   Weight: 100.699 kg (222 lb)       General: Slight slurring of words c/w stroke history. Oriented to person, place, and time. Appears well-developed and well-nourished. No distress.   Head: Normocephalic and atraumatic. Mouth notable for periodontal disease, tooth decay.  Eyes: Conjunctivae and EOM are normal. Pupils are equal, round, and reactive to light.   Neck: Normal range of motion. Neck supple. No thyromegaly present. No LN enlargement.  Cardiovascular: Normal rate, regular rhythm, normal heart sounds and intact distal pulses.  Exam reveals no gallop and no friction rub.    No murmur heard.  Pulmonary/Chest: On initial exam has rhonchi and ee wheezing. Improved wheezing and more airflow following albuterol.  Abdominal: refused exam  Neurological: Alert and oriented to person, place, and time. No gross cranial nerve deficit without formal exam.   Skin: Skin is warm and dry.   Psychiatric: Appears hurried, limited interest in communication. Does not appear concerned about her breathing or other problems,  mostly interested in securing access to meds, referrals, and transportation.    ASSESSMENT & PLAN    Asthma With Acute Exacerbation  A: Recent ED visit noted in chart. COPD exacerbation treated with nebulizers and steroid taper. Patient has limited tolerance for discussing this, but was treated with albuterol. She also does not have a spacer for inhalers, and does not appear to be timing them correctly to get the medicine into her lungs.   P:Albuterol neb in office. Prescribe spacer. Return to care with PCP within next couple of weeks for f/u COPD and DMII.     Diabetes Mellitus  A: Not interested in discussing this extensively, however does have a very  appropriate interest in securing access to podiatry and optho services.  P: Continue treatment with current regimen until DM can be reassessed. Referrals to Podiatry, opthalmology.    Dental caries  A: Advanced tooth and gum disease. Aware of the problem, would like referral to dentistry.  P: Referred to [facility] dentistry.          1. Unspecified asthma, with exacerbation  albuterol (PROVENTIL) nebulization 2.5 mg   2. Asthma With Acute Exacerbation     3. Diabetes Mellitus     4. Dental caries         Rondel Baton, MD    __________________________________________________________________________________    12/07/2010 10:30 AM    Preceptor Attestation - [facility]   Preceptor: Esmeralda Arthur, MD    I reviewed and independently saw patient and confirmed significant history, exam findings, and plan of care with Rondel Baton, MD.  I agree with the findings and plan of care as documented above with additions.    Exam:  GENERAL:  Obese African American female, NAD, A&Ox3, obviously dyspneic.     RESPIRATORY:  Left > right rhonchi, (+) wheezing, moderately decreased air exchange bilaterly.  POST ALBUTEROL: Marked improvement in air exchange, decreased rhonchi but still present lett > right.       Assessment & Plan:  55 y.o. female significant PMHx COPD with recent hospitalziation for exacerbation, HTN, Asthma, and DM presents following up for COPD exacerbation and medication refills.    1. COPD exacerbation / Moderate dyspnea / COPD / Tobacco abuse  -  Severe COPD that is poorly controlled likely secondary to multiple factors (patient compliance, continued tobacco abuse).  Patient's dyspnea improved with albuterol neb in office.  Patient would not stay for counseling of COPD care.    2. Dental carries -  Referral to density.    3. DM -  Referral to podiatry.      Esmeralda Arthur, MD  Attending, [facility]

## 2010-12-21 ENCOUNTER — Ambulatory Visit: Payer: Self-pay | Admitting: Family Medicine

## 2010-12-23 ENCOUNTER — Encounter: Payer: Self-pay | Admitting: Primary Care

## 2010-12-29 ENCOUNTER — Other Ambulatory Visit: Payer: Self-pay | Admitting: Family Medicine

## 2010-12-29 DIAGNOSIS — E119 Type 2 diabetes mellitus without complications: Secondary | ICD-10-CM

## 2010-12-29 DIAGNOSIS — I10 Essential (primary) hypertension: Secondary | ICD-10-CM

## 2010-12-29 DIAGNOSIS — J449 Chronic obstructive pulmonary disease, unspecified: Secondary | ICD-10-CM

## 2010-12-29 NOTE — Progress Notes (Signed)
Referral to [facility] for eval and treat CHF, DM, HTN.

## 2010-12-30 ENCOUNTER — Telehealth: Payer: Self-pay | Admitting: Licensed Practical Nurse

## 2010-12-30 NOTE — Telephone Encounter (Signed)
Writer attempted calling pt at [phone]; a man answered and stated "wrong number" when I asked to speak with Gloria Ray. Writer attempted alternate phone # in pt's record 3 times and received busy signal. Pt has appt with Dr Juliane Poot scheduled 01/11/11, we can address [facility] referral at that time.

## 2011-01-05 ENCOUNTER — Other Ambulatory Visit: Payer: Self-pay | Admitting: Primary Care

## 2011-01-05 MED ORDER — OMEPRAZOLE 20 MG PO CPDR *I*
DELAYED_RELEASE_CAPSULE | ORAL | Status: DC
Start: 2011-01-05 — End: 2011-04-18

## 2011-01-11 ENCOUNTER — Ambulatory Visit: Payer: Self-pay | Admitting: Family Medicine

## 2011-01-11 ENCOUNTER — Other Ambulatory Visit: Payer: Self-pay | Admitting: Primary Care

## 2011-01-11 NOTE — Telephone Encounter (Signed)
plavix prescribed by her doctor in [place]. She cancelled her appointment with you today (01/11/2011) and is now rescheduled for 02/08/2011.

## 2011-01-12 MED ORDER — DULOXETINE HCL 60 MG PO CPEP *I*
60.0000 mg | DELAYED_RELEASE_CAPSULE | Freq: Every day | ORAL | Status: DC
Start: 2011-01-11 — End: 2011-03-29

## 2011-01-12 MED ORDER — ROSUVASTATIN CALCIUM 5 MG PO TABS *I*
5.0000 mg | ORAL_TABLET | Freq: Every day | ORAL | Status: DC
Start: 2011-01-11 — End: 2011-03-29

## 2011-01-12 MED ORDER — MELOXICAM 7.5 MG PO TABS *I*
7.5000 mg | ORAL_TABLET | Freq: Two times a day (BID) | ORAL | Status: DC | PRN
Start: 2011-01-11 — End: 2011-08-01

## 2011-01-12 MED ORDER — CLOPIDOGREL BISULFATE 75 MG PO TABS *I*
75.0000 mg | ORAL_TABLET | Freq: Every day | ORAL | Status: DC
Start: 2011-01-11 — End: 2011-03-29

## 2011-01-13 ENCOUNTER — Other Ambulatory Visit: Payer: Self-pay | Admitting: Primary Care

## 2011-01-24 ENCOUNTER — Telehealth: Payer: Self-pay | Admitting: Primary Care

## 2011-01-24 NOTE — Telephone Encounter (Signed)
Patient calling in about her transportation form. We had done one for medicaid, but she now has [employer]. New form for this put into your mailbox last week. She needs to get this done asap, has appointments coming up.

## 2011-01-24 NOTE — Telephone Encounter (Signed)
Forms completed and faxed to insurance company for approval.

## 2011-01-28 MED ORDER — MISC. DEVICES KIT
1.0000 | PACK | Freq: Once | Status: AC
Start: 2011-01-28 — End: 2011-01-28

## 2011-01-28 MED ORDER — GLUCOSE BLOOD VI STRP *A*
ORAL_STRIP | Status: DC
Start: 2011-01-28 — End: 2011-08-02

## 2011-01-28 MED ORDER — LANCETS MISC *A*
Status: DC
Start: 2011-01-28 — End: 2012-07-17

## 2011-01-28 NOTE — Addendum Note (Signed)
Addended byRondel Baton on: 01/28/2011 08:39 AM     Modules accepted: Orders

## 2011-02-08 ENCOUNTER — Ambulatory Visit: Payer: Self-pay | Admitting: Family Medicine

## 2011-02-13 ENCOUNTER — Other Ambulatory Visit: Payer: Self-pay | Admitting: Family Medicine

## 2011-02-13 ENCOUNTER — Ambulatory Visit: Payer: Self-pay | Admitting: Family Medicine

## 2011-02-13 ENCOUNTER — Ambulatory Visit: Payer: Self-pay | Admitting: Primary Care

## 2011-02-13 VITALS — BP 110/70 | HR 100 | Ht 64.0 in | Wt 211.0 lb

## 2011-02-13 DIAGNOSIS — K029 Dental caries, unspecified: Secondary | ICD-10-CM

## 2011-02-13 DIAGNOSIS — J449 Chronic obstructive pulmonary disease, unspecified: Secondary | ICD-10-CM

## 2011-02-13 MED ORDER — TRAZODONE HCL 100 MG PO TABS *I*
ORAL_TABLET | ORAL | Status: DC
Start: 2011-02-13 — End: 2011-04-18

## 2011-02-13 MED ORDER — BUPROPION HCL 100 MG PO TB12 *I*
100.0000 mg | ORAL_TABLET | Freq: Two times a day (BID) | ORAL | Status: DC
Start: 2011-02-13 — End: 2011-04-18

## 2011-02-13 MED ORDER — TRAMADOL HCL 50 MG PO TABS *I*
50.0000 mg | ORAL_TABLET | Freq: Four times a day (QID) | ORAL | Status: DC | PRN
Start: 2011-02-13 — End: 2011-05-29

## 2011-02-13 MED ORDER — DOCUSATE SODIUM 100 MG PO CAPS *I*
100.0000 mg | ORAL_CAPSULE | Freq: Two times a day (BID) | ORAL | Status: DC
Start: 2011-02-13 — End: 2011-03-29

## 2011-03-24 ENCOUNTER — Encounter: Payer: Self-pay | Admitting: Emergency Medicine

## 2011-03-24 ENCOUNTER — Other Ambulatory Visit: Payer: Self-pay | Admitting: Gastroenterology

## 2011-03-24 ENCOUNTER — Emergency Department
Admission: EM | Admit: 2011-03-24 | Disposition: A | Discharge status: Home / Self Care | Payer: Self-pay | Source: Ambulatory Visit | Attending: Emergency Medicine | Admitting: Emergency Medicine

## 2011-03-24 LAB — CALCIUM: Calcium: 9.3 mg/dL (ref 8.6–10.2)

## 2011-03-24 LAB — PLASMA PROF 7 (ED ONLY)
Anion Gap,PL: 13 (ref 7–16)
CO2,Plasma: 25 mmol/L (ref 20–28)
Chloride,Plasma: 100 mmol/L (ref 96–108)
Creatinine: 0.91 mg/dL (ref 0.51–0.95)
GFR,Black: >59 *
GFR,Caucasian: >59 *
Glucose,Plasma: 232 mg/dL — ABNORMAL HIGH (ref 60–99)
Potassium,Plasma: 3 mmol/L — ABNORMAL LOW (ref 3.4–4.7)
Sodium,Plasma: 138 mmol/L (ref 132–146)
UN,Plasma: 14 mg/dL (ref 6–20)

## 2011-03-24 LAB — CK: CK: 107 U/L (ref 34–145)

## 2011-03-24 LAB — CBC AND DIFFERENTIAL
Baso # K/uL: 0 10*3/uL (ref 0.0–0.1)
Basophil %: 0.3 % (ref 0.1–1.2)
Eos # K/uL: 0.1 10*3/uL (ref 0.0–0.4)
Eosinophil %: 0.7 % (ref 0.7–5.8)
Hematocrit: 36 % (ref 34–45)
Hemoglobin: 11.1 g/dL — ABNORMAL LOW (ref 11.2–15.7)
Lymph # K/uL: 2.6 10*3/uL (ref 1.2–3.7)
Lymphocyte %: 28.6 % (ref 19.3–51.7)
MCV: 79 fL (ref 79–95)
Mono # K/uL: 0.5 10*3/uL (ref 0.2–0.9)
Monocyte %: 5.9 % (ref 4.7–12.5)
Neut # K/uL: 5.8 10*3/uL (ref 1.6–6.1)
Platelets: 290 10*3/uL (ref 160–370)
RBC: 4.5 MIL/uL (ref 3.9–5.2)
RDW: 16.7 % — ABNORMAL HIGH (ref 11.7–14.4)
Seg Neut %: 64.5 % (ref 34.0–71.1)
WBC: 9.1 10*3/uL (ref 4.0–10.0)

## 2011-03-24 LAB — MAGNESIUM: Magnesium: 1.3 mEq/L (ref 1.3–2.1)

## 2011-03-24 LAB — TROPONIN T: Troponin T: <0.01 ng/mL (ref 0.00–0.02)

## 2011-03-24 LAB — HOLD BLUE

## 2011-03-24 MED ORDER — ALBUTEROL SULFATE (2.5 MG/3ML) 0.083% IN NEBU *I*
2.5000 mg | INHALATION_SOLUTION | Freq: Four times a day (QID) | RESPIRATORY_TRACT | Status: DC | PRN
Start: 2011-03-24 — End: 2011-03-29

## 2011-03-24 MED ORDER — IPRATROPIUM-ALBUTEROL 0.5-2.5 MG/3ML IN SOLN *I*
3.0000 mL | Freq: Once | RESPIRATORY_TRACT | Status: AC
Start: 2011-03-24 — End: 2011-03-24
  Administered 2011-03-24: 3 mL via RESPIRATORY_TRACT
  Filled 2011-03-24: qty 3

## 2011-03-24 MED ORDER — PREDNISONE 20 MG PO TABS *I*
60.0000 mg | ORAL_TABLET | Freq: Once | ORAL | Status: AC
Start: 2011-03-24 — End: 2011-03-24
  Administered 2011-03-24: 60 mg via ORAL
  Filled 2011-03-24: qty 3

## 2011-03-24 MED ORDER — OXYCODONE-ACETAMINOPHEN 5-325 MG PO TABS *I*
1.0000 | ORAL_TABLET | ORAL | Status: DC | PRN
Start: 2011-03-24 — End: 2011-03-29

## 2011-03-24 MED ORDER — PREDNISONE 50 MG PO TABS *I*
50.0000 mg | ORAL_TABLET | Freq: Every day | ORAL | Status: DC
Start: 2011-03-24 — End: 2011-03-29

## 2011-03-24 MED ORDER — OXYCODONE-ACETAMINOPHEN 5-325 MG PO TABS *I*
2.0000 | ORAL_TABLET | Freq: Once | ORAL | Status: AC
Start: 2011-03-24 — End: 2011-03-24
  Administered 2011-03-24: 2 via ORAL
  Filled 2011-03-24: qty 2

## 2011-03-24 MED ORDER — IPRATROPIUM-ALBUTEROL 0.5-2.5 MG/3ML IN SOLN *I*
3.0000 mL | Freq: Four times a day (QID) | RESPIRATORY_TRACT | Status: AC
Start: 2011-03-24 — End: 2011-03-24
  Administered 2011-03-24: 3 mL via RESPIRATORY_TRACT
  Filled 2011-03-24: qty 3

## 2011-03-27 ENCOUNTER — Encounter: Payer: Self-pay | Admitting: Family Medicine

## 2011-03-27 ENCOUNTER — Inpatient Hospital Stay
Admit: 2011-03-27 | Disposition: A | Discharge status: Home Health | Payer: Self-pay | Source: Ambulatory Visit | Attending: Family Medicine | Admitting: Family Medicine

## 2011-03-27 ENCOUNTER — Telehealth: Payer: Self-pay | Admitting: Family Medicine

## 2011-03-27 LAB — CBC AND DIFFERENTIAL
Baso # K/uL: 0 10*3/uL (ref 0.0–0.1)
Baso # K/uL: 0 10*3/uL (ref 0.0–0.1)
Basophil %: 0.1 % (ref 0.1–1.2)
Basophil %: 0.2 % (ref 0.1–1.2)
Eos # K/uL: 0.1 10*3/uL (ref 0.0–0.4)
Eos # K/uL: 0 10*3/uL (ref 0.0–0.4)
Eosinophil %: 0.9 % (ref 0.7–5.8)
Eosinophil %: 0.1 % — ABNORMAL LOW (ref 0.7–5.8)
Hematocrit: 34 % (ref 34–45)
Hematocrit: 35 % (ref 34–45)
Hemoglobin: 10.7 g/dL — ABNORMAL LOW (ref 11.2–15.7)
Hemoglobin: 10.7 g/dL — ABNORMAL LOW (ref 11.2–15.7)
Lymph # K/uL: 3.3 10*3/uL (ref 1.2–3.7)
Lymph # K/uL: 0.9 10*3/uL — ABNORMAL LOW (ref 1.2–3.7)
Lymphocyte %: 38 % (ref 19.3–51.7)
Lymphocyte %: 9 % — ABNORMAL LOW (ref 19.3–51.7)
MCV: 80 fL (ref 79–95)
MCV: 80 fL (ref 79–95)
Mono # K/uL: 0.4 10*3/uL (ref 0.2–0.9)
Mono # K/uL: 0.1 10*3/uL — ABNORMAL LOW (ref 0.2–0.9)
Monocyte %: 4.4 % — ABNORMAL LOW (ref 4.7–12.5)
Monocyte %: 1 % — ABNORMAL LOW (ref 4.7–12.5)
Neut # K/uL: 4.9 10*3/uL (ref 1.6–6.1)
Neut # K/uL: 8.6 10*3/uL — ABNORMAL HIGH (ref 1.6–6.1)
Platelets: 270 10*3/uL (ref 160–370)
Platelets: 264 10*3/uL (ref 160–370)
RBC: 4.3 MIL/uL (ref 3.9–5.2)
RBC: 4.3 MIL/uL (ref 3.9–5.2)
RDW: 17.1 % — ABNORMAL HIGH (ref 11.7–14.4)
RDW: 17.2 % — ABNORMAL HIGH (ref 11.7–14.4)
Seg Neut %: 56.4 % (ref 34.0–71.1)
Seg Neut %: 89.5 % — ABNORMAL HIGH (ref 34.0–71.1)
WBC: 8.7 10*3/uL (ref 4.0–10.0)
WBC: 9.6 10*3/uL (ref 4.0–10.0)

## 2011-03-27 LAB — BASIC METABOLIC PANEL
Anion Gap: 13 (ref 7–16)
Anion Gap: 21 — ABNORMAL HIGH (ref 7–16)
CO2: 26 mmol/L (ref 20–28)
CO2: 21 mmol/L (ref 20–28)
Calcium: 8.8 mg/dL (ref 8.6–10.2)
Calcium: 8.9 mg/dL (ref 8.6–10.2)
Chloride: 102 mmol/L (ref 96–108)
Chloride: 96 mmol/L (ref 96–108)
Creatinine: 1.04 mg/dL — ABNORMAL HIGH (ref 0.51–0.95)
Creatinine: 1.21 mg/dL — ABNORMAL HIGH (ref 0.51–0.95)
GFR,Black: >59 *
GFR,Black: 56 * — AB
GFR,Caucasian: 55 * — AB
GFR,Caucasian: 46 * — AB
Glucose: 195 mg/dL — ABNORMAL HIGH (ref 60–99)
Glucose: 418 mg/dL — ABNORMAL HIGH (ref 60–99)
Lab: 19 mg/dL (ref 6–20)
Lab: 21 mg/dL — ABNORMAL HIGH (ref 6–20)
Potassium: 3.1 mmol/L — ABNORMAL LOW (ref 3.3–5.1)
Potassium: 3.5 mmol/L (ref 3.3–5.1)
Sodium: 141 mmol/L (ref 133–145)
Sodium: 138 mmol/L (ref 133–145)

## 2011-03-27 LAB — HOLD SST

## 2011-03-27 LAB — HOLD BLUE

## 2011-03-27 LAB — EKG 12-LEAD
P: 57 degrees
QRS: 0 degrees
Rate: 93 {beats}/min
Severity: ABNORMAL
Severity: ABNORMAL
T: 95 degrees

## 2011-03-27 LAB — HOLD GREEN WITH GEL

## 2011-03-27 LAB — POCT GLUCOSE
Glucose POCT: 468 mg/dL — ABNORMAL HIGH (ref 60–99)
Glucose POCT: 458 mg/dL — ABNORMAL HIGH (ref 60–99)
Glucose POCT: 450 mg/dL — ABNORMAL HIGH (ref 60–99)
Glucose POCT: 367 mg/dL — ABNORMAL HIGH (ref 60–99)
Glucose POCT: 352 mg/dL — ABNORMAL HIGH (ref 60–99)

## 2011-03-27 LAB — HOLD RED

## 2011-03-27 LAB — HOLD LAVENDER

## 2011-03-27 LAB — BLOOD BANK HOLD LAVENDER

## 2011-03-27 MED ORDER — NICOTINE PATCH REMOVAL *I*
Freq: Every day | Status: DC
Start: 2011-03-28 — End: 2011-03-29
  Filled 2011-03-27: qty 1

## 2011-03-27 MED ORDER — INSULIN GLARGINE 100 UNIT/ML SC SOLN *WRAPPED*
25.0000 [IU] | Freq: Once | SUBCUTANEOUS | Status: AC
Start: 2011-03-27 — End: 2011-03-27
  Administered 2011-03-27: 25 [IU] via SUBCUTANEOUS

## 2011-03-27 MED ORDER — AMLODIPINE BESYLATE 5 MG PO TABS *I*
10.0000 mg | ORAL_TABLET | Freq: Every day | ORAL | Status: DC
Start: 2011-03-27 — End: 2011-03-29
  Administered 2011-03-27 – 2011-03-29 (×3): 10 mg via ORAL
  Filled 2011-03-27 (×3): qty 2

## 2011-03-27 MED ORDER — DULOXETINE HCL 30 MG PO CPEP *I*
60.0000 mg | DELAYED_RELEASE_CAPSULE | Freq: Every day | ORAL | Status: DC
Start: 2011-03-27 — End: 2011-03-29
  Administered 2011-03-27 – 2011-03-29 (×3): 60 mg via ORAL
  Filled 2011-03-27 (×3): qty 2

## 2011-03-27 MED ORDER — NICOTINE 14 MG/24HR TD PT24 *I*
1.0000 | MEDICATED_PATCH | Freq: Every day | TRANSDERMAL | Status: DC
Start: 2011-03-27 — End: 2011-03-29
  Administered 2011-03-27 – 2011-03-29 (×3): 1 via TRANSDERMAL
  Filled 2011-03-27 (×3): qty 1

## 2011-03-27 MED ORDER — IBUPROFEN 600 MG PO TABS *I*
600.0000 mg | ORAL_TABLET | Freq: Four times a day (QID) | ORAL | Status: DC | PRN
Start: 2011-03-27 — End: 2011-03-28
  Administered 2011-03-27 (×2): 600 mg via ORAL
  Filled 2011-03-27 (×2): qty 1

## 2011-03-27 MED ORDER — INSULIN GLARGINE 100 UNIT/ML SC SOLN *WRAPPED*
50.0000 [IU] | Freq: Two times a day (BID) | SUBCUTANEOUS | Status: DC
Start: 2011-03-27 — End: 2011-03-29
  Administered 2011-03-27 – 2011-03-29 (×4): 50 [IU] via SUBCUTANEOUS

## 2011-03-27 MED ORDER — INSULIN LISPRO (HUMAN) 100 UNIT/ML IJ/SC SOLN *WRAPPED*
0.0000 [IU] | Freq: Three times a day (TID) | SUBCUTANEOUS | Status: DC
Start: 2011-03-27 — End: 2011-03-29
  Administered 2011-03-27: 22 [IU] via SUBCUTANEOUS
  Administered 2011-03-27: 20 [IU] via SUBCUTANEOUS
  Administered 2011-03-28: 10 [IU] via SUBCUTANEOUS
  Administered 2011-03-28: 33 [IU] via SUBCUTANEOUS
  Administered 2011-03-29: 23 [IU] via SUBCUTANEOUS

## 2011-03-27 MED ORDER — DEXTROSE 50 % IV SOLN *I*
25.0000 g | INTRAVENOUS | Status: DC | PRN
Start: 2011-03-27 — End: 2011-03-29

## 2011-03-27 MED ORDER — DOCUSATE SODIUM 100 MG PO CAPS *I*
100.0000 mg | ORAL_CAPSULE | Freq: Two times a day (BID) | ORAL | Status: DC
Start: 2011-03-27 — End: 2011-03-29
  Administered 2011-03-27 – 2011-03-29 (×5): 100 mg via ORAL
  Filled 2011-03-27 (×5): qty 1

## 2011-03-27 MED ORDER — METHYLPREDNISOLONE SOD SUCC 125 MG IJ SOLR(62.5MG/ML) *WRAPPED*
125.0000 mg | Freq: Once | INTRAMUSCULAR | Status: DC
Start: 2011-03-27 — End: 2011-03-27

## 2011-03-27 MED ORDER — PREDNISONE 10 MG PO TABS *I*
40.0000 mg | ORAL_TABLET | Freq: Every day | ORAL | Status: DC
Start: 2011-03-27 — End: 2011-03-27

## 2011-03-27 MED ORDER — ALBUTEROL SULFATE (2.5 MG/3ML) 0.083% IN NEBU *I*
7.5000 mg | INHALATION_SOLUTION | RESPIRATORY_TRACT | Status: AC
Start: 2011-03-27 — End: 2011-03-27

## 2011-03-27 MED ORDER — METHYLPREDNISOLONE SOD SUCC 125 MG IJ SOLR(62.5MG/ML) *WRAPPED*
125.0000 mg | Freq: Four times a day (QID) | INTRAMUSCULAR | Status: DC
Start: 2011-03-27 — End: 2011-03-27
  Administered 2011-03-27 (×2): 125 mg via INTRAVENOUS
  Filled 2011-03-27 (×2): qty 2

## 2011-03-27 MED ORDER — IPRATROPIUM BROMIDE 0.02 % IN SOLN *I*
RESPIRATORY_TRACT | Status: AC
Start: 2011-03-27 — End: 2011-03-27
  Administered 2011-03-27: 0.5 mg via RESPIRATORY_TRACT
  Filled 2011-03-27: qty 2.5

## 2011-03-27 MED ORDER — SODIUM CHLORIDE 0.9 % IV SOLN WRAPPED *I*
125.0000 mg | Freq: Four times a day (QID) | INTRAMUSCULAR | Status: DC
Start: 2011-03-27 — End: 2011-03-27

## 2011-03-27 MED ORDER — ACETAMINOPHEN 325 MG PO TABS *I*
650.0000 mg | ORAL_TABLET | Freq: Four times a day (QID) | ORAL | Status: DC | PRN
Start: 2011-03-27 — End: 2011-03-27

## 2011-03-27 MED ORDER — BUPROPION HCL 100 MG PO TB12 *I*
100.0000 mg | ORAL_TABLET | Freq: Two times a day (BID) | ORAL | Status: DC
Start: 2011-03-27 — End: 2011-03-29
  Administered 2011-03-27 – 2011-03-29 (×5): 100 mg via ORAL
  Filled 2011-03-27 (×5): qty 1

## 2011-03-27 MED ORDER — INSULIN LISPRO (HUMAN) 100 UNIT/ML IJ/SC SOLN *WRAPPED*
0.0000 [IU] | Freq: Three times a day (TID) | SUBCUTANEOUS | Status: DC
Start: 2011-03-27 — End: 2011-03-27
  Administered 2011-03-27: 20 [IU] via SUBCUTANEOUS

## 2011-03-27 MED ORDER — ALBUTEROL SULFATE (2.5 MG/3ML) 0.083% IN NEBU *I*
INHALATION_SOLUTION | RESPIRATORY_TRACT | Status: AC
Start: 2011-03-27 — End: 2011-03-27
  Administered 2011-03-27: 7.5 mg via RESPIRATORY_TRACT
  Filled 2011-03-27: qty 9

## 2011-03-27 MED ORDER — GLUCAGON HCL (RDNA) 1 MG IJ SOLR *WRAPPED*
1.0000 mg | INTRAMUSCULAR | Status: DC | PRN
Start: 2011-03-27 — End: 2011-03-29

## 2011-03-27 MED ORDER — INSULIN LISPRO (HUMAN) 100 UNIT/ML IJ/SC SOLN *WRAPPED*
30.0000 [IU] | Freq: Three times a day (TID) | SUBCUTANEOUS | Status: DC
Start: 2011-03-27 — End: 2011-03-27

## 2011-03-27 MED ORDER — ALBUTEROL SULFATE (2.5 MG/3ML) 0.083% IN NEBU *I*
2.5000 mg | INHALATION_SOLUTION | RESPIRATORY_TRACT | Status: DC | PRN
Start: 2011-03-27 — End: 2011-03-29

## 2011-03-27 MED ORDER — GLUCOSE 40 % PO GEL *I*
15.0000 g | ORAL | Status: DC | PRN
Start: 2011-03-27 — End: 2011-03-29

## 2011-03-27 MED ORDER — HEPARIN SODIUM 5000 UNIT/ML SQ *I*
5000.0000 [IU] | Freq: Three times a day (TID) | SUBCUTANEOUS | Status: DC
Start: 2011-03-27 — End: 2011-03-29
  Administered 2011-03-27 – 2011-03-29 (×6): 5000 [IU] via SUBCUTANEOUS
  Filled 2011-03-27 (×6): qty 1

## 2011-03-27 MED ORDER — INSULIN GLARGINE 100 UNIT/ML SC SOLN *WRAPPED*
50.0000 [IU] | Freq: Two times a day (BID) | SUBCUTANEOUS | Status: DC
Start: 2011-03-27 — End: 2011-03-27

## 2011-03-27 MED ORDER — IPRATROPIUM-ALBUTEROL 0.5-2.5 MG/3ML IN SOLN *I*
3.0000 mL | Freq: Four times a day (QID) | RESPIRATORY_TRACT | Status: DC
Start: 2011-03-27 — End: 2011-03-29
  Administered 2011-03-27 – 2011-03-29 (×10): 3 mL via RESPIRATORY_TRACT
  Filled 2011-03-27 (×10): qty 3

## 2011-03-27 MED ORDER — OXYCODONE-ACETAMINOPHEN 5-325 MG PO TABS *I*
1.0000 | ORAL_TABLET | Freq: Four times a day (QID) | ORAL | Status: DC | PRN
Start: 2011-03-27 — End: 2011-03-27
  Administered 2011-03-27 (×2): 1 via ORAL
  Filled 2011-03-27 (×2): qty 1

## 2011-03-27 MED ORDER — CLOPIDOGREL BISULFATE 75 MG PO TABS *I*
75.0000 mg | ORAL_TABLET | Freq: Every day | ORAL | Status: DC
Start: 2011-03-27 — End: 2011-03-29
  Administered 2011-03-27 – 2011-03-29 (×3): 75 mg via ORAL
  Filled 2011-03-27 (×3): qty 1

## 2011-03-27 MED ORDER — LOSARTAN POTASSIUM 25 MG PO TABS *I*
50.0000 mg | ORAL_TABLET | Freq: Every day | ORAL | Status: DC
Start: 2011-03-27 — End: 2011-03-29
  Administered 2011-03-27 – 2011-03-29 (×3): 50 mg via ORAL
  Filled 2011-03-27 (×3): qty 2

## 2011-03-27 MED ORDER — ASPIRIN 325 MG PO TABS *I*
325.0000 mg | ORAL_TABLET | Freq: Every day | ORAL | Status: DC
Start: 2011-03-27 — End: 2011-03-29
  Administered 2011-03-27 – 2011-03-29 (×3): 325 mg via ORAL
  Filled 2011-03-27 (×3): qty 1

## 2011-03-27 MED ORDER — IPRATROPIUM BROMIDE 0.02 % IN SOLN *I*
500.0000 ug | RESPIRATORY_TRACT | Status: AC
Start: 2011-03-27 — End: 2011-03-27

## 2011-03-27 MED ORDER — KETOROLAC TROMETHAMINE 30 MG/ML IJ SOLN *I*
INTRAMUSCULAR | Status: AC
Start: 2011-03-27 — End: 2011-03-27
  Administered 2011-03-27: 30 mg via INTRAVENOUS
  Filled 2011-03-27: qty 1

## 2011-03-27 MED ORDER — TRAZODONE HCL 50 MG PO TABS *I*
100.0000 mg | ORAL_TABLET | Freq: Every evening | ORAL | Status: DC
Start: 2011-03-27 — End: 2011-03-29
  Administered 2011-03-27 – 2011-03-28 (×2): 100 mg via ORAL
  Filled 2011-03-27 (×2): qty 2

## 2011-03-27 MED ORDER — KETOROLAC TROMETHAMINE 30 MG/ML IJ SOLN *I*
30.0000 mg | Freq: Once | INTRAMUSCULAR | Status: AC
Start: 2011-03-27 — End: 2011-03-27

## 2011-03-27 MED ORDER — PREDNISONE 20 MG PO TABS *I*
40.0000 mg | ORAL_TABLET | Freq: Every day | ORAL | Status: DC
Start: 2011-03-27 — End: 2011-03-29
  Administered 2011-03-27 – 2011-03-28 (×2): 40 mg via ORAL
  Filled 2011-03-27: qty 4
  Filled 2011-03-27: qty 2

## 2011-03-28 LAB — CBC AND DIFFERENTIAL
Baso # K/uL: 0 10*3/uL (ref 0.0–0.1)
Basophil %: 0.1 % (ref 0.1–1.2)
Eos # K/uL: 0 10*3/uL (ref 0.0–0.4)
Eosinophil %: 0 % — ABNORMAL LOW (ref 0.7–5.8)
Hematocrit: 31 % — ABNORMAL LOW (ref 34–45)
Hemoglobin: 10.4 g/dL — ABNORMAL LOW (ref 11.2–15.7)
Lymph # K/uL: 1.4 10*3/uL (ref 1.2–3.7)
Lymphocyte %: 11.8 % — ABNORMAL LOW (ref 19.3–51.7)
MCV: 79 fL (ref 79–95)
Mono # K/uL: 0.8 10*3/uL (ref 0.2–0.9)
Monocyte %: 6.5 % (ref 4.7–12.5)
Neut # K/uL: 9.9 10*3/uL — ABNORMAL HIGH (ref 1.6–6.1)
Platelets: 282 10*3/uL (ref 160–370)
RBC: 4 MIL/uL (ref 3.9–5.2)
RDW: 17.4 % — ABNORMAL HIGH (ref 11.7–14.4)
Seg Neut %: 81.4 % — ABNORMAL HIGH (ref 34.0–71.1)
WBC: 12.2 10*3/uL — ABNORMAL HIGH (ref 4.0–10.0)

## 2011-03-28 LAB — BASIC METABOLIC PANEL
Anion Gap: 13 (ref 7–16)
CO2: 25 mmol/L (ref 20–28)
Calcium: 9.1 mg/dL (ref 8.6–10.2)
Chloride: 100 mmol/L (ref 96–108)
Creatinine: 1.03 mg/dL — ABNORMAL HIGH (ref 0.51–0.95)
GFR,Black: >59 *
GFR,Caucasian: 56 * — AB
Glucose: 297 mg/dL — ABNORMAL HIGH (ref 60–99)
Lab: 23 mg/dL — ABNORMAL HIGH (ref 6–20)
Potassium: 3.9 mmol/L (ref 3.3–5.1)
Sodium: 138 mmol/L (ref 133–145)

## 2011-03-28 LAB — MICROALBUMIN, URINE, RANDOM
Creatinine,UR: 63 mg/dL (ref 20–300)
Microalb/Creat Ratio: 27.5 mg MA/g CR (ref 0.0–29.9)
Microalbumin,UR: 1.73 mg/dL

## 2011-03-28 LAB — POCT GLUCOSE
Glucose POCT: 341 mg/dL — ABNORMAL HIGH (ref 60–99)
Glucose POCT: 462 mg/dL — ABNORMAL HIGH (ref 60–99)
Glucose POCT: 465 mg/dL — ABNORMAL HIGH (ref 60–99)
Glucose POCT: 115 mg/dL — ABNORMAL HIGH (ref 60–99)
Glucose POCT: 70 mg/dL (ref 60–99)
Glucose POCT: 298 mg/dL — ABNORMAL HIGH (ref 60–99)

## 2011-03-28 LAB — HEMOGLOBIN A1C: Hemoglobin A1C: 10.6 % — ABNORMAL HIGH (ref 4.0–6.0)

## 2011-03-28 MED ORDER — IBUPROFEN 600 MG PO TABS *I*
600.0000 mg | ORAL_TABLET | Freq: Four times a day (QID) | ORAL | Status: DC | PRN
Start: 2011-03-28 — End: 2011-03-29
  Administered 2011-03-28: 600 mg via ORAL
  Filled 2011-03-28: qty 1

## 2011-03-29 ENCOUNTER — Ambulatory Visit: Payer: Self-pay | Admitting: Family Medicine

## 2011-03-29 LAB — CBC AND DIFFERENTIAL
Baso # K/uL: 0 10*3/uL (ref 0.0–0.1)
Basophil %: 0.1 % (ref 0.1–1.2)
Eos # K/uL: 0 10*3/uL (ref 0.0–0.4)
Eosinophil %: 0 % — ABNORMAL LOW (ref 0.7–5.8)
Hematocrit: 32 % — ABNORMAL LOW (ref 34–45)
Hemoglobin: 10 g/dL — ABNORMAL LOW (ref 11.2–15.7)
Lymph # K/uL: 2.3 10*3/uL (ref 1.2–3.7)
Lymphocyte %: 22.3 % (ref 19.3–51.7)
MCV: 80 fL (ref 79–95)
Mono # K/uL: 0.4 10*3/uL (ref 0.2–0.9)
Monocyte %: 4 % — ABNORMAL LOW (ref 4.7–12.5)
Neut # K/uL: 7.4 10*3/uL — ABNORMAL HIGH (ref 1.6–6.1)
Platelets: 293 10*3/uL (ref 160–370)
RBC: 4 MIL/uL (ref 3.9–5.2)
RDW: 17.8 % — ABNORMAL HIGH (ref 11.7–14.4)
Seg Neut %: 73.3 % — ABNORMAL HIGH (ref 34.0–71.1)
WBC: 10.1 10*3/uL — ABNORMAL HIGH (ref 4.0–10.0)

## 2011-03-29 LAB — BASIC METABOLIC PANEL
Anion Gap: 13 (ref 7–16)
CO2: 24 mmol/L (ref 20–28)
Calcium: 8.4 mg/dL — ABNORMAL LOW (ref 8.6–10.2)
Chloride: 102 mmol/L (ref 96–108)
Creatinine: 1.09 mg/dL — ABNORMAL HIGH (ref 0.51–0.95)
GFR,Black: >59 *
GFR,Caucasian: 52 * — AB
Glucose: 374 mg/dL — ABNORMAL HIGH (ref 60–99)
Lab: 26 mg/dL — ABNORMAL HIGH (ref 6–20)
Potassium: 4.2 mmol/L (ref 3.3–5.1)
Sodium: 139 mmol/L (ref 133–145)

## 2011-03-29 LAB — POCT GLUCOSE
Glucose POCT: 404 mg/dL — ABNORMAL HIGH (ref 60–99)
Glucose POCT: 410 mg/dL — ABNORMAL HIGH (ref 60–99)
Glucose POCT: 249 mg/dL — ABNORMAL HIGH (ref 60–99)
Glucose POCT: 66 mg/dL (ref 60–99)
Glucose POCT: 124 mg/dL — ABNORMAL HIGH (ref 60–99)

## 2011-03-29 MED ORDER — INSULIN GLARGINE 100 UNIT/ML SC SOLN *WRAPPED*
55.0000 [IU] | Freq: Two times a day (BID) | SUBCUTANEOUS | Status: DC
Start: 2011-03-29 — End: 2011-04-06

## 2011-03-29 MED ORDER — DOCUSATE SODIUM 100 MG PO CAPS *I*
100.0000 mg | ORAL_CAPSULE | Freq: Two times a day (BID) | ORAL | Status: AC
Start: 2011-03-29 — End: 2011-09-25

## 2011-03-29 MED ORDER — PREDNISONE 20 MG PO TABS *I*
40.0000 mg | ORAL_TABLET | Freq: Every day | ORAL | Status: DC
Start: 2011-03-29 — End: 2011-03-29

## 2011-03-29 MED ORDER — ALBUTEROL SULFATE (2.5 MG/3ML) 0.083% IN NEBU *I*
2.5000 mg | INHALATION_SOLUTION | Freq: Four times a day (QID) | RESPIRATORY_TRACT | Status: DC | PRN
Start: 2011-03-29 — End: 2011-04-10

## 2011-03-29 MED ORDER — PNEUMOCOCCAL VAC POLYVALENT 25 MCG/0.5ML INJ *WRAPPED*
0.5000 mL | INJECTION | INTRAMUSCULAR | Status: AC
Start: 2011-03-29 — End: 2011-03-29
  Administered 2011-03-29: 0.5 mL via INTRAMUSCULAR
  Filled 2011-03-29: qty 0.5

## 2011-03-29 MED ORDER — DULOXETINE HCL 60 MG PO CPEP *I*
60.0000 mg | DELAYED_RELEASE_CAPSULE | Freq: Every day | ORAL | Status: DC
Start: 2011-03-29 — End: 2011-04-10

## 2011-03-29 MED ORDER — TRAZODONE HCL 100 MG PO TABS *I*
100.0000 mg | ORAL_TABLET | Freq: Every evening | ORAL | Status: DC
Start: 2011-03-29 — End: 2011-05-02

## 2011-03-29 MED ORDER — LOSARTAN POTASSIUM 50 MG PO TABS *I*
50.0000 mg | ORAL_TABLET | Freq: Every day | ORAL | Status: DC
Start: 2011-03-29 — End: 2011-04-06

## 2011-03-29 MED ORDER — CLOPIDOGREL BISULFATE 75 MG PO TABS *I*
75.0000 mg | ORAL_TABLET | Freq: Every day | ORAL | Status: DC
Start: 2011-03-29 — End: 2011-03-29

## 2011-03-29 MED ORDER — INSULIN GLARGINE 100 UNIT/ML SC SOLN *WRAPPED*
50.0000 [IU] | Freq: Two times a day (BID) | SUBCUTANEOUS | Status: DC
Start: 2011-03-29 — End: 2011-03-29

## 2011-03-29 MED ORDER — NICOTINE 14 MG/24HR TD PT24 *I*
1.0000 | MEDICATED_PATCH | Freq: Every day | TRANSDERMAL | Status: DC
Start: 2011-03-29 — End: 2011-03-29

## 2011-03-29 MED ORDER — ALBUTEROL SULFATE (2.5 MG/3ML) 0.083% IN NEBU *I*
2.5000 mg | INHALATION_SOLUTION | RESPIRATORY_TRACT | Status: DC | PRN
Start: 2011-03-29 — End: 2011-04-10

## 2011-03-29 MED ORDER — AMLODIPINE BESYLATE 10 MG PO TABS *I*
10.0000 mg | ORAL_TABLET | Freq: Every day | ORAL | Status: DC
Start: 2011-03-29 — End: 2011-03-29

## 2011-03-29 MED ORDER — NICOTINE 14 MG/24HR TD PT24 *I*
1.0000 | MEDICATED_PATCH | Freq: Every day | TRANSDERMAL | Status: DC
Start: 2011-03-29 — End: 2011-04-10

## 2011-03-29 MED ORDER — ALBUTEROL SULFATE (2.5 MG/3ML) 0.083% IN NEBU *I*
2.5000 mg | INHALATION_SOLUTION | RESPIRATORY_TRACT | Status: DC | PRN
Start: 2011-03-29 — End: 2011-03-29

## 2011-03-29 MED ORDER — BUDESONIDE-FORMOTEROL FUMARATE 160-4.5 MCG/ACT IN AERO *I*
1.0000 | INHALATION_SPRAY | Freq: Two times a day (BID) | RESPIRATORY_TRACT | Status: DC
Start: 2011-03-29 — End: 2011-04-18

## 2011-03-29 MED ORDER — IPRATROPIUM-ALBUTEROL 18-103 MCG/ACT IN AERO *I*
1.0000 | INHALATION_SPRAY | Freq: Four times a day (QID) | RESPIRATORY_TRACT | Status: DC | PRN
Start: 2011-03-29 — End: 2011-04-10

## 2011-03-29 MED ORDER — AMLODIPINE BESYLATE 10 MG PO TABS *I*
10.0000 mg | ORAL_TABLET | Freq: Every day | ORAL | Status: DC
Start: 2011-03-29 — End: 2011-04-06

## 2011-03-29 MED ORDER — INFLUENZA (FLULAVAL) 5ML VIAL 2012 *A*
0.5000 mL | INJECTION | INTRAMUSCULAR | Status: AC
Start: 2011-03-29 — End: 2011-03-29
  Administered 2011-03-29: 0.5 mL via INTRAMUSCULAR
  Filled 2011-03-29: qty 0.6

## 2011-03-29 MED ORDER — CLOPIDOGREL BISULFATE 75 MG PO TABS *I*
75.0000 mg | ORAL_TABLET | Freq: Every day | ORAL | Status: DC
Start: 2011-03-29 — End: 2011-04-06

## 2011-03-29 MED ORDER — PREDNISONE 20 MG PO TABS *I*
40.0000 mg | ORAL_TABLET | Freq: Every day | ORAL | Status: AC
Start: 2011-03-29 — End: 2011-04-01

## 2011-03-29 MED ORDER — LOSARTAN POTASSIUM 50 MG PO TABS *I*
50.0000 mg | ORAL_TABLET | Freq: Every day | ORAL | Status: DC
Start: 2011-03-29 — End: 2011-03-29

## 2011-03-29 MED ORDER — IPRATROPIUM-ALBUTEROL 0.5-2.5 MG/3ML IN SOLN *I*
3.0000 mL | Freq: Four times a day (QID) | RESPIRATORY_TRACT | Status: DC
Start: 2011-03-29 — End: 2012-03-01

## 2011-03-29 MED ORDER — ASPIRIN 325 MG PO TABS *I*
325.0000 mg | ORAL_TABLET | Freq: Every day | ORAL | Status: DC
Start: 2011-03-29 — End: 2011-04-10

## 2011-03-30 ENCOUNTER — Telehealth: Payer: Self-pay | Admitting: Licensed Practical Nurse

## 2011-03-31 ENCOUNTER — Telehealth: Payer: Self-pay | Admitting: Primary Care

## 2011-04-03 ENCOUNTER — Telehealth: Payer: Self-pay | Admitting: Family Medicine

## 2011-04-03 ENCOUNTER — Ambulatory Visit: Payer: Self-pay | Admitting: Family Medicine

## 2011-04-03 NOTE — Telephone Encounter (Signed)
Spoke with the patient's emergency contact [name] (daughter) who informs the Clinical research associate that the patient's phone only texts currently, no voice calls.  The number reported working (again, texts only) is [phone]) [phone].      The writer sent the patient the following text messages:  "Hello Gloria Ray, this is Dr. Juliane Poot at [facility].  Just spoke with Gloria Ray (sic), she tells me your phone only texts right now.  Just want to say hello, and make sure that you are doing OK."  "Text me anytime if there is anything I can do for you, my cell number is <PCP cell phone number>."    There was no reply to these messages.  [name] reports that patient will not be coming to follow up appointment today, but that the patient is recovering.  Nursing informed.    [name], MD

## 2011-04-04 ENCOUNTER — Telehealth: Payer: Self-pay

## 2011-04-04 NOTE — Telephone Encounter (Signed)
Pt opened for home care with referrals to speech and OT. BP a little elevated standing: 150/90; sitting: 158/86. Pt D/C fm hosp on many meds, but not sure if pt taking them. (concerning elevated BP). Several of the scripts were not filled d/t ins vs prescriber not recognized msg. List put in CCP inbox. Biggest question at this time is: Pt on Novolog prior to hospitalization. Novolog not mentioned in D/C list of meds, pt states that she was verbally told to continue Novolog at 30units before meals. Please confirm. New med list includes Lantus 55 units BID.  Please advise [id number] pool so HCR RN can be notified. Thank You!!!

## 2011-04-05 ENCOUNTER — Other Ambulatory Visit: Payer: Self-pay | Admitting: Family Medicine

## 2011-04-06 ENCOUNTER — Other Ambulatory Visit: Payer: Self-pay | Admitting: Family Medicine

## 2011-04-06 DIAGNOSIS — E119 Type 2 diabetes mellitus without complications: Secondary | ICD-10-CM

## 2011-04-06 DIAGNOSIS — I2510 Atherosclerotic heart disease of native coronary artery without angina pectoris: Secondary | ICD-10-CM

## 2011-04-06 DIAGNOSIS — I10 Essential (primary) hypertension: Secondary | ICD-10-CM

## 2011-04-06 MED ORDER — AMLODIPINE BESYLATE 10 MG PO TABS *I*
10.0000 mg | ORAL_TABLET | Freq: Every day | ORAL | Status: DC
Start: 2011-04-06 — End: 2011-04-10

## 2011-04-06 MED ORDER — LOSARTAN POTASSIUM 50 MG PO TABS *I*
50.0000 mg | ORAL_TABLET | Freq: Every day | ORAL | Status: DC
Start: 2011-04-06 — End: 2011-04-10

## 2011-04-06 MED ORDER — INSULIN GLARGINE 100 UNIT/ML SC SOLN *WRAPPED*
55.0000 [IU] | Freq: Two times a day (BID) | SUBCUTANEOUS | Status: DC
Start: 2011-04-06 — End: 2011-04-10

## 2011-04-06 MED ORDER — CLOPIDOGREL BISULFATE 75 MG PO TABS *I*
75.0000 mg | ORAL_TABLET | Freq: Every day | ORAL | Status: AC
Start: 2011-04-06 — End: 2011-10-03

## 2011-04-06 NOTE — Progress Notes (Signed)
Assessment   Patient not receiving medications   Below are med orders from D/C summary:     Pulmonary- COPD exacerbation   Remaining 7 day course of PO Prednisone, and refills for her Albuterol, Duoneb and ICS (Fluticasone did not require a prior authorization per pharmacy).     Endocrinology- DM2   Her Lantus regimen was increased to 55U BID and she was instructed to check her fasting sugars daily and follow up in the office for further monitoring and adjustment of her medications.     CVS: HTN, CAD   Pt was continued on her Plavix, ASA and Amlodipine. She was also initiated on Losartan for poorly controlled BPs and h/o Lisinopril allergy. She was given prescriptions for her Plavix and Losartan on discharge. On day of discharge, she c/o LE edema and requested a diuretic which had previously controlled this. She was told to discuss this with her PCP at future visit.     Plan   - Reorder Lantus 55U daily with BG checks fasting and ac, transmit BGs to PCP for insulin adjustment based on this data, prefer office visit with provider at [facility]   - Reorder Losartan and amlodipine, transmit BP data to PCP for adjustment  - Reorder Plavix  - Nursing to contact [facility]

## 2011-04-06 NOTE — Telephone Encounter (Signed)
Assessment  Below are orders from D/C summary:    Pulmonary- COPD exacerbation  Remaining 7 day course of PO Prednisone, and refills for her Albuterol, Duoneb and ICS (Fluticasone did not require a prior authorization per pharmacy).     Endocrinology- DM2   Her Lantus regimen was increased to 55U BID and she was instructed to check her fasting sugars daily and follow up in the office for further monitoring and adjustment of her medications.     CVS: HTN, CAD   Pt was continued on her Plavix, ASA and Amlodipine. She was also initiated on Losartan for poorly controlled BPs and h/o Lisinopril allergy. She was given prescriptions for her Plavix and Losartan on discharge. On day of discharge, she c/o LE edema and requested a diuretic which had previously controlled this. She was told to discuss this with her PCP at future visit.     Plan  - Reorder Lantus 55U daily with BG checks fasting and ac, transmit BGs to PCP for insulin adjustment based on this data, prefer office visit with provider at [facility]  - Reorder Losartan and amlodipine, transmit BP data to PCP for adjustment

## 2011-04-10 MED ORDER — IPRATROPIUM-ALBUTEROL 18-103 MCG/ACT IN AERO *I*
1.0000 | INHALATION_SPRAY | Freq: Four times a day (QID) | RESPIRATORY_TRACT | Status: DC | PRN
Start: 2011-04-10 — End: 2011-12-21

## 2011-04-10 MED ORDER — NICOTINE 14 MG/24HR TD PT24 *I*
1.0000 | MEDICATED_PATCH | Freq: Every day | TRANSDERMAL | Status: DC
Start: 2011-04-10 — End: 2011-09-20

## 2011-04-10 MED ORDER — INSULIN GLARGINE 100 UNIT/ML SC SOLN PEN *A*
55.0000 [IU] | Freq: Every evening | SUBCUTANEOUS | Status: DC
Start: 2011-04-10 — End: 2011-06-16

## 2011-04-10 MED ORDER — AMLODIPINE BESYLATE 10 MG PO TABS *I*
10.0000 mg | ORAL_TABLET | Freq: Every day | ORAL | Status: AC
Start: 2011-04-10 — End: 2011-10-07

## 2011-04-10 MED ORDER — DULOXETINE HCL 60 MG PO CPEP *I*
60.0000 mg | DELAYED_RELEASE_CAPSULE | Freq: Every day | ORAL | Status: DC
Start: 2011-04-10 — End: 2011-09-20

## 2011-04-10 MED ORDER — ALBUTEROL SULFATE (2.5 MG/3ML) 0.083% IN NEBU *I*
2.5000 mg | INHALATION_SOLUTION | RESPIRATORY_TRACT | Status: DC | PRN
Start: 2011-04-10 — End: 2011-06-26

## 2011-04-10 MED ORDER — ASPIRIN 325 MG PO TABS *I*
325.0000 mg | ORAL_TABLET | Freq: Every day | ORAL | Status: DC
Start: 2011-04-10 — End: 2011-12-11

## 2011-04-10 MED ORDER — LOSARTAN POTASSIUM 50 MG PO TABS *I*
50.0000 mg | ORAL_TABLET | Freq: Every day | ORAL | Status: DC
Start: 2011-04-10 — End: 2011-05-29

## 2011-04-11 ENCOUNTER — Encounter: Payer: Self-pay | Admitting: Gastroenterology

## 2011-04-13 ENCOUNTER — Ambulatory Visit: Payer: Self-pay | Admitting: Family Medicine

## 2011-04-13 ENCOUNTER — Encounter: Payer: Self-pay | Admitting: Gastroenterology

## 2011-04-18 ENCOUNTER — Ambulatory Visit: Payer: Self-pay | Admitting: Family Medicine

## 2011-04-18 ENCOUNTER — Encounter: Payer: Self-pay | Admitting: Family Medicine

## 2011-04-18 ENCOUNTER — Telehealth: Payer: Self-pay

## 2011-04-18 VITALS — BP 133/84 | HR 105 | Ht 64.17 in | Wt 225.0 lb

## 2011-04-18 DIAGNOSIS — I69391 Dysphagia following cerebral infarction: Secondary | ICD-10-CM

## 2011-04-18 DIAGNOSIS — J45909 Unspecified asthma, uncomplicated: Secondary | ICD-10-CM

## 2011-04-18 DIAGNOSIS — K219 Gastro-esophageal reflux disease without esophagitis: Secondary | ICD-10-CM

## 2011-04-18 DIAGNOSIS — E119 Type 2 diabetes mellitus without complications: Secondary | ICD-10-CM

## 2011-04-18 DIAGNOSIS — K0889 Other specified disorders of teeth and supporting structures: Secondary | ICD-10-CM

## 2011-04-18 MED ORDER — OMEPRAZOLE 20 MG PO CPDR *I*
DELAYED_RELEASE_CAPSULE | ORAL | Status: DC
Start: 2011-04-18 — End: 2011-12-19

## 2011-04-18 MED ORDER — ACETAMINOPHEN 325 MG PO TABS *I*
650.0000 mg | ORAL_TABLET | Freq: Four times a day (QID) | ORAL | Status: DC | PRN
Start: 2011-04-18 — End: 2011-08-01

## 2011-04-18 MED ORDER — BUDESONIDE-FORMOTEROL FUMARATE 160-4.5 MCG/ACT IN AERO *I*
1.0000 | INHALATION_SPRAY | Freq: Two times a day (BID) | RESPIRATORY_TRACT | Status: AC
Start: 2011-04-18 — End: 2011-05-18

## 2011-04-18 MED ORDER — STARCH (THICKENING) PO PACK *A*
PACK | ORAL | Status: DC
Start: 2011-04-18 — End: 2011-06-16

## 2011-04-18 MED ORDER — INSULIN LISPRO (HUMAN) 100 UNIT/ML SC SOLN PEN *A*
SUBCUTANEOUS | Status: DC
Start: 2011-04-18 — End: 2011-06-16

## 2011-04-18 NOTE — Telephone Encounter (Signed)
Message copied by Carmell Austria on Tue Apr 18, 2011 11:46 AM  ------       Message from: [facility], MATTHEW       Created: Mon Apr 10, 2011  2:57 PM       Regarding: RE: [facility]       Contact: [phone]         Meds reordered, Ms. Boender REALLY needs to be seen for full med reconciliation and optimization of diabetes management.  Please inform if she refuses clinic visit and I will need to set up a home visit.              MH                     ----- Message -----          From: Hazle Coca, RN          Sent: 04/10/2011  10:44 AM            To: [name], MD       Subject: FW: [place]                          Pt also looking for other scripts that couldn't be filled upon D/C fm hosp.  Please advise [id number] pool so pt can be notified if some scripts are NOT going to be reissued, or what is going on with them.. Thank You!!!              ----- Message -----          From: Bufford Buttner          Sent: 04/10/2011   9:09 AM            To: [name], MD, Hansel Feinstein [phone] Nurse       Subject: [facility]                              A prescription for Lantus vials was sent over for [facility]. Patient has been on Solostar. Please re-issue a scipt for pens.       Thanks.       Nicci

## 2011-04-18 NOTE — Telephone Encounter (Signed)
Pt has appt sched 2/12 w/L.Stannard,FNP

## 2011-04-20 NOTE — Progress Notes (Signed)
Subjective:     Patient ID: Gloria Ray is a 56 y.o. female.    HPI  Here for follow up multiple issues:   1. Asthma: using inhalers as prescribed, but has not been able to pick up symbicort. DOE. + dry cough. Getting over recent URI: no fever or chills. No change in mucous production.   2. GERD: taking medication as prescribed. Occassional reflux, but much better when taking medication consistently. Denies abdominal pain, no changes in stools.   3. DMII: currently taking 55 units lantus and 30 units novalog as directed: has been using novalog she brought up from [place]: no order here for short acting and needs it. Taking 30 units before meals. Denies symptoms of hypo/hyperglycemia. Has not seen opthalmology. Has not seen podiatrist. Denies open or active lesions to feet.   4. AVW:UJWJXBJYN: ran out of thick-it an needs refills. Some difficulty swallowing since her cva. Denies fever or chills. Aware if need to eat slowly, stay in upright position after eating for at least 30 min.to reduce risk of aspiration. Right sided weakness: uses cane for ambulation: difficulty ambulating long distances:tires easily: snowy icy sidewalks makes it nearly impossible. No personal vehicle. Needs transportation form completed.   5. Dental caries: multiple dental caries causing a lot of pain: using tramadol with little relief. Scheduled for teeth extraction next week.     Patient's medications, allergies, past medical, surgical, social and family histories were reviewed and updated as appropriate.    Review of Systems  Per above    Objective:   Physical Exam  BP 133/84  Pulse 105  Ht 1.63 m (5' 4.17")  Wt 102.059 kg (225 lb)  BMI 38.41 kg/m2  General appearance: obese, alert, uncomfortable appearing, repositioning self frequently.   HEENT:PERRL, EOMI, TMs normal, oropharynx clear: multiple caries with teeth rotted to gum line.   NECK:Supple, no lymphadenopathy, thyroid symmetrical wit no lesion or nodules appreciated.    Chest: clear to auscultation, no wheezes, rales or rhonchi, symmetric air entry.  CVS exam: normal rate, regular rhythm, normal S1, S2, no murmurs, rubs, clicks or gallops.  Abdominal exam: soft, nontender, nondistended, no masses or organomegaly.3/5 strength to right upper and lower extremity. 5/5 left.   Exam of extremities: peripheral pulses normal, no pedal edema, no clubbing or cyanosis  Neurological: Alert and oriented x3, slow, steaday  Gait with cane    Assessment/Plan:  1. Asthma: vss. Uncontrolled symtpoms. Refill for symbicourt done. F/u 1 month avoid 2nd hand tobacco smoke.   2. GERD: stable on current regime. Continue  3. DMII: previous elevated A1c. Will order short acting as she states she is taking and recheck labs today. F/u with results at next visit. Counseling on diet, medication and exercise done. Stressed importance of regular visits to manage DM II  4. CVA/Dysphagia: thick it reordered: reviewed safety issues with swallowing to avoid aspiration pneumonia, difficulty ambulating, no personal vehicle or others who can transport her,  transportation form completed  5. Dental caries: multiple caries chronic issue, already on several pain medications. Scheduled next week for extraction: soft foods, maintain hydration. F/u prn.

## 2011-05-01 ENCOUNTER — Telehealth: Payer: Self-pay

## 2011-05-01 DIAGNOSIS — G4700 Insomnia, unspecified: Secondary | ICD-10-CM

## 2011-05-01 NOTE — Telephone Encounter (Signed)
Pt c/o still not being able to sleep even with the trazadone. If she takes one 100mg  tablet she may sleep for two hours, but then she's up again, needing another one. If she takes two they work okay, but that makes the script run out too early. She was on the Ambien, and isn't sure why they changed it, but she would like to increase the trazadone to 200mg  nightly, please.  Please advise [id number] pool so pt can be notified. Thank You!!!

## 2011-05-02 DIAGNOSIS — G4700 Insomnia, unspecified: Secondary | ICD-10-CM | POA: Insufficient documentation

## 2011-05-02 MED ORDER — TRAZODONE HCL 100 MG PO TABS *I*
200.0000 mg | ORAL_TABLET | Freq: Every evening | ORAL | Status: DC
Start: 2011-05-02 — End: 2011-05-11

## 2011-05-02 NOTE — Telephone Encounter (Signed)
Assessment/Plan       Gloria Ray is a 56 y.o. female and  has a past medical history of Asthma; COPD (chronic obstructive pulmonary disease); Coronary artery disease; Diabetes mellitus; and Hypertension.    Insomnia    Improved by self-escalation of trazodone dose from 100 to 200mg  qhs.  May benefit from further escalation to max 400mg  daily.  Consider hyponatremia and QT prolongation as a potential side effects.  Consider low dose doxepin as alternative   Trazodone 200mg  qhs, taper to discontinue    Subjective  Chief Complaint   Patient presents with    Medication Problem/Question     HPI:  1.  Insomnia.  Patient call to nursing taking 100mg  trazodone qhs for insomnia, reports better effect with 200mg  qhs.  Requesting change to Rx.     Objective  BP Readings from Last 3 Encounters:   04/18/11 133/84   03/29/11 134/70   03/24/11 150/98     Results for orders placed during the hospital encounter of 03/27/11   BASIC METABOLIC PANEL       Component Value Range    Glucose 195 (*) 60 - 99 mg/dL    Sodium 696  295 - 284 mmol/L    Potassium 3.1 (*) 3.3 - 5.1 mmol/L    Chloride 102  96 - 108 mmol/L    CO2 26  20 - 28 mmol/L    Anion Gap 13  7 - 16    UN 19  6 - 20 mg/dL    Creatinine 1.32 (*) 0.51 - 0.95 mg/dL    GFR,Caucasian 55 (*)     GFR,Black >59      Calcium 8.8  8.6 - 10.2 mg/dL          Gloria Ray M.D., Ph.D.  05/02/2011 7:53 AM

## 2011-05-02 NOTE — Assessment & Plan Note (Signed)
Improved by self-escalation of trazodone dose from 100 to 200mg  qhs.  May benefit from further escalation to max 400mg  daily.  Consider hyponatremia and QT prolongation as a potential side effects.  Consider low dose doxepin as alternative   Trazodone 200mg  qhs, taper to discontinue

## 2011-05-05 ENCOUNTER — Encounter: Payer: Self-pay | Admitting: Gastroenterology

## 2011-05-11 ENCOUNTER — Other Ambulatory Visit: Payer: Self-pay | Admitting: Family Medicine

## 2011-05-19 ENCOUNTER — Ambulatory Visit: Payer: Self-pay | Admitting: Family Medicine

## 2011-05-22 ENCOUNTER — Encounter: Payer: Self-pay | Admitting: Family Medicine

## 2011-05-24 ENCOUNTER — Encounter: Payer: Self-pay | Admitting: Gastroenterology

## 2011-05-29 ENCOUNTER — Other Ambulatory Visit: Payer: Self-pay | Admitting: Family Medicine

## 2011-05-29 ENCOUNTER — Ambulatory Visit: Payer: Self-pay | Admitting: Family Medicine

## 2011-05-29 ENCOUNTER — Other Ambulatory Visit: Payer: Self-pay | Admitting: Primary Care

## 2011-05-29 ENCOUNTER — Encounter: Payer: Self-pay | Admitting: Family Medicine

## 2011-05-29 VITALS — BP 120/84 | HR 80 | Ht 64.0 in | Wt 223.0 lb

## 2011-05-29 DIAGNOSIS — E785 Hyperlipidemia, unspecified: Secondary | ICD-10-CM

## 2011-05-29 DIAGNOSIS — J441 Chronic obstructive pulmonary disease with (acute) exacerbation: Secondary | ICD-10-CM

## 2011-05-29 DIAGNOSIS — E119 Type 2 diabetes mellitus without complications: Secondary | ICD-10-CM

## 2011-05-29 DIAGNOSIS — IMO0002 Reserved for concepts with insufficient information to code with codable children: Secondary | ICD-10-CM

## 2011-05-29 DIAGNOSIS — I10 Essential (primary) hypertension: Secondary | ICD-10-CM

## 2011-05-29 LAB — COMPREHENSIVE METABOLIC PANEL
ALT: 15 U/L (ref 0–35)
AST: 13 U/L (ref 0–35)
Albumin: 4.4 g/dL (ref 3.5–5.2)
Alk Phos: 74 U/L (ref 35–105)
Anion Gap: 14 (ref 7–16)
Bilirubin,Total: 0.2 mg/dL (ref 0.0–1.2)
CO2: 25 mmol/L (ref 20–28)
Calcium: 9.6 mg/dL (ref 8.6–10.2)
Chloride: 104 mmol/L (ref 96–108)
Creatinine: 1.02 mg/dL — ABNORMAL HIGH (ref 0.51–0.95)
GFR,Black: 72 *
GFR,Caucasian: 62 *
Glucose: 44 mg/dL — ABNORMAL LOW (ref 60–99)
Lab: 19 mg/dL (ref 6–20)
Potassium: 3.7 mmol/L (ref 3.3–5.1)
Sodium: 143 mmol/L (ref 133–145)
Total Protein: 7.3 g/dL (ref 6.3–7.7)

## 2011-05-29 LAB — LIPID PANEL
Chol/HDL Ratio: 4.6
Cholesterol: 259 mg/dL — AB
HDL: 56 mg/dL
LDL Calculated: 181 mg/dL
Non HDL Cholesterol: 203 mg/dL
Triglycerides: 112 mg/dL

## 2011-05-29 LAB — CBC
Hematocrit: 37 % (ref 34–45)
Hemoglobin: 11.5 g/dL (ref 11.2–15.7)
MCV: 83 fL (ref 79–95)
Platelets: 305 10*3/uL (ref 160–370)
RBC: 4.5 MIL/uL (ref 3.9–5.2)
RDW: 18.2 % — ABNORMAL HIGH (ref 11.7–14.4)
WBC: 7.9 10*3/uL (ref 4.0–10.0)

## 2011-05-29 LAB — UNABLE TO VOID

## 2011-05-29 MED ORDER — LOSARTAN POTASSIUM 50 MG PO TABS *I*
50.0000 mg | ORAL_TABLET | Freq: Every day | ORAL | Status: AC
Start: 2011-05-29 — End: 2011-11-25

## 2011-05-29 MED ORDER — INSULIN PEN NEEDLE 31G X 5 MM MISC *A*
Status: DC
Start: 2011-05-29 — End: 2011-10-13

## 2011-05-29 MED ORDER — GLUCOSE 40 % PO GEL *I*
15.0000 g | Freq: Once | ORAL | Status: DC | PRN
Start: 2011-05-29 — End: 2011-05-30

## 2011-05-29 MED ORDER — ROSUVASTATIN CALCIUM 10 MG PO TABS *I*
10.0000 mg | ORAL_TABLET | Freq: Every day | ORAL | Status: DC
Start: 2011-05-29 — End: 2011-05-30

## 2011-05-29 NOTE — Progress Notes (Signed)
Family Medicine Office Visit    S: Asked to see patient for hypoglycemia.  1. Patient was sitting in waiting room (waiting for her partner) when she experience weakness, diaphoresis, palpitations, and tremors. BG checked at the time was 66. Patient given 15gm oral dextrose 40% and recheck BG was 124 with resolution of symptoms. She last ate breakfast at 6am and her BG =66 was at noon-time.   2. Patient last HgA1c was 10.6 on 03/27/2011. She is on Lantus 55u qHS and Lispro 30u TID.  - She is compliant with therapy every day. She injects at the right abdomen and does not rotate site often.   - She has been on current regiment for at least the past month.   - She checks BGs TID: fasting morning BG, midday before meals, and nighttime at 6pm. Average fasting is 180s in the morning. Average noon and nighttime is 160-180s. She has 1 episode of hypoglycemia in the past month, and this was yesterday PM, her BG was 49 with mild symptoms at 6pm (her last meal was 4pm that day). She had a large meal for the BG = 49 and the next morning, her BG was 429. There were no other episodes of hypoglycemia.  - She endorses polydipsia, polyuria. Does not experience tremors, palpations  - There is no FHx of seizures.      Objective:  Filed Vitals:    05/29/11 1208   BP: 120/84   Pulse: 80   Height: 1.626 m (5\' 4" )   Weight: 101.152 kg (223 lb)       Physical Exam:  Gen: NAD  CV: RRR no m/r/g, no peripheral edema  Abd: Site of insulin injection is not overly fibrotic, there is still SQ fat.      A/P: 56 y/o F seen urgently for hypoglycemic episodes    1. NIDDM/ Hypoglycemia  - Seems to run hyperglycemia rather than hypoglycemic  - No changes in medication  - Script given for PO glucose, advised to keep with person at all times for PRN hypoglycemia  - Check HgA1c and renal function - could be retaining insulin with worsening renal failure (at later stages).  - Has visit tomorrow with CCP      Claudell Kyle, MD Family Medicine R2 on 05/29/2011  at 12:13 PM    --------------------    Preceptor Attestation - [facility]     I have reviewed the history, exam findings, and plan of care and discussed the care plan with the resident at the time of the visit. I agree with the resident's findings and plan of care as documented above.    Hypoglycemic episode in waiting room while here with her husband.  BG up after 15 gram of dextrose.  Has f/u tomorrow. No changes made as generally well controlled, likely secondary decreased PO intake this morning.    Recheck labs today to recheck renal function and a1c has f/u visit tomorrow.      HOLLY ANN RUSSELL, MD

## 2011-05-30 ENCOUNTER — Telehealth: Payer: Self-pay

## 2011-05-30 ENCOUNTER — Encounter: Payer: Self-pay | Admitting: Family Medicine

## 2011-05-30 ENCOUNTER — Ambulatory Visit: Payer: Self-pay | Admitting: Family Medicine

## 2011-05-30 VITALS — BP 165/98 | HR 89 | Temp 98.6°F | Ht 64.0 in | Wt 223.4 lb

## 2011-05-30 DIAGNOSIS — G4700 Insomnia, unspecified: Secondary | ICD-10-CM

## 2011-05-30 DIAGNOSIS — H524 Presbyopia: Secondary | ICD-10-CM

## 2011-05-30 DIAGNOSIS — K0889 Other specified disorders of teeth and supporting structures: Secondary | ICD-10-CM | POA: Insufficient documentation

## 2011-05-30 LAB — HEMOGLOBIN A1C: Hemoglobin A1C: 8.3 % — ABNORMAL HIGH (ref 4.0–6.0)

## 2011-05-30 MED ORDER — ATORVASTATIN CALCIUM 10 MG PO TABS *I*
10.0000 mg | ORAL_TABLET | Freq: Every day | ORAL | Status: DC
Start: 2011-05-30 — End: 2011-06-08

## 2011-05-30 MED ORDER — TRAZODONE HCL 300 MG PO TABS *A*
300.0000 mg | ORAL_TABLET | Freq: Every evening | ORAL | Status: DC
Start: 2011-05-30 — End: 2011-06-16

## 2011-05-30 MED ORDER — GLUCOSE 40 % PO GEL *I*
15.0000 g | Freq: Once | ORAL | Status: DC | PRN
Start: 2011-05-30 — End: 2011-06-22

## 2011-05-30 NOTE — Progress Notes (Signed)
Assessment/Plan       Gloria Ray is a 56 y.o. female and  has a past medical history of Asthma; COPD (chronic obstructive pulmonary disease); Coronary artery disease; Diabetes mellitus; Hypertension; Ischemic Stroke (03/06/2006); and Prior Myocardial Infarction (03/19/2007).    Type II, uncontrolled    Uncontrolled DM II on insulin therapy.  Adherent to insulin regimen with help of fiance Gloria Ray.  Experiencing intermittent hypoglycemia due to lack of food and subsequent change in eating habits.  Non-adherent to diabetic diet recommendations.  Non-adherent to activity recommendations, complicated by dyspnea on exertion.     Recommend 1/2 Lantus dose and hold short acting insulin when unable to eat for long periods of time to avoid hypoglycemia   Re-establish 4x daily insulin checks for insulin titration   3AM BG check to r/o Somogyi effect   Re-establish [facility] for assistance with diabetes management   Home visits until [facility] re-established   Referral to diabetes educator    Lack of food    Acute, patient has no food reserves or access to services at present.  Will get assistance check in 5 days, no current access to help from friends or family.   Consult to Social work for Psychologist, occupational with patient and located food pantries near patient residence- patient reports that she is home bound but fiance Gloria Ray can walk or ride bus, bus passes would help   Elder Source contact info [phone] identified per [employer] (211 not available on patient's phone [employer]) so called [phone])    Pain, dental    Poor general dentition.  Multiple teeth with caries, apparent gingival disease but no purulence or symptoms suggesting current dental infection.   Ambulatory referral to dentistry    Bilateral presbyopia    Unlikely need for referral to opthalmology, will re-evaluate and re-consider referral if complaints are not remedied by change in reading glasses prescription.   Ambulatory  referral to optometry           Subjective  Chief Complaint   Patient presents with   . Diabetes     HPI:  1. Diabetes Mellitus             Patient reports that she is taking medications as prescribed: 55 Units Lantus BID (110 units daily) and 30 Units Humalog with meals.  Denies adverse medication effects at this time, notes recent episodes of hypoglycemia.  She was treated at [facility] after experiencing hypoglycemia while accompanying her fiance Gloria Ray (alternative spelling of Gloria Ray)  She is not checking blood glucose outside of the office on a regular basis.  She is requesting social work consult because it is the end of the month and she reports running low on food.  Meal this morning was a pork chop.  She has no lunch.  She has a can of black beans at home for supper, and then she is out of food and money for food.  She continues to use insulins as prescribed at hospital discharge.  Gloria Ray is providing social support, helping her with medications.  Denies current headache, vision changes, increased urination, nausea, vomiting, chest pain, palpitations or loss of motor function.  She does experience pre-syncope with hypoglycemic episodes.   2. Pain, dental        She reports chronic dental pain, many teeth she believes need to be pulled.  She reports recurrent infections.  She denies current fever, chills, orthopnea.  She is able to eat and drink  although with pain.  She prefers the [facility] clinic   3. Bilateral presbyopia        She reports that her vision is getting worse, able to see at distance but not close.  Has trouble reading small print.  Onset has been gradual.  She has a pair of glasses but they are not helping much.  She denies eye pain or headaches, denies loss of visual fields.       Review of Systems:  As per HPI.         Objective  BP 165/98  Pulse 89  Temp(Src) 37 C (98.6 F) (Temporal)  Ht 1.626 m (5\' 4" )  Wt 101.334 kg (223 lb 6.4 oz)  BMI 38.35 kg/m2  SpO2 97%         Physical Exam    VITALS: Reviewed above and hypertensive.  BMI category is moderate obesity.  GEN: Alert and cooperative, appears older than stated age, no acute distress.  HEENT: Atraumatic.  EOM normal, conjunctiva clear.  Moist mucus membranes, no oropharyngeal lesions.  Poor dentition with many missing teeth, multiple dental caries and apparent gingival disease.  No purulence, edema, bleeding or marked erythema.  PULM: Lungs are clear to auscultation bilaterally with good effort.  No crackles, rales, or wheezes appreciated in upper or lower lung fields.  CV: Regular rate, S1, S2.  No S3, S4, murmurs or rubs.  No JVD, no carotid bruit.  ABD: Bowel sounds normal.  Soft, non-tender, non-distended.  Obese abdomen.  SKIN: Warm and dry. No rashes, ecchymoses, or petechiae on exposed surfaces.  NEURO:  Alert.  Cranial nerves grossly intact.  Strength is normal and symmetric.  Gait is slow and deliberate.  Balance is normal.    PSYCH:  Patient well dressed and well groomed, sitting in chair.  Speech is slow but inteligible. Thought content is concrete, mildly paranoid, but generally linear.  Judgement is impaired.  Insight is impaired.         Gloria Ray M.D., Ph.D.    05/30/2011 10:40 AM    --------------------    Preceptor Attestation - Patient Seen    I saw and evaluated the patient. I agree with the resident's/fellow's findings and plan of care as documented above. Details of my evaluation are as follows:   S: 56 yr old AA female with mult medical problems, including uncontrolled IDDM, uncontrolled HTN, knee pain, insomnia, obesity.  O: Appears in NAD, tired appearing  A/P: I agree with Dr. Wallie Ray evaluation, assessment and plan as documented in his note above which was reviewed in its entirety.   Agree with his plan for checking BGs; also strongly recommend she have evaluation 1:1 with Diabetes Health Source educator and nutritionist and consider referral to Dr. Sampson Ray, [facility] Endocrinologist for further  evaluation and mgmnt of DM especially considering her high insulin requirements.     Stoney Bang, MD

## 2011-05-30 NOTE — Telephone Encounter (Signed)
Called [facility] to set up with services, but pt has been flagged with them as being non-compliant with PCP appts, Doesn't take BG's reagularly, On several occasions couldn't find BG monitor, and has NO FAMILIAL SUPPORT. During [facility] interview when daughter was asked to help mom with care she "just laughed". They found that this client was not safe and therefore cannot provide services for her. They are willing to discuss this, if Dr would like to call.

## 2011-05-31 ENCOUNTER — Telehealth: Payer: Self-pay | Admitting: Family Medicine

## 2011-05-31 LAB — VITAMIN D
25-OH VIT D2: <4 ng/mL
25-OH VIT D3: 13 ng/mL
25-OH Vit Total: 13 ng/mL — ABNORMAL LOW (ref 30–80)

## 2011-05-31 NOTE — Telephone Encounter (Signed)
Patient would like for you to give her a call regarding what you spoke about last night. Thank you.

## 2011-06-01 ENCOUNTER — Telehealth: Payer: Self-pay

## 2011-06-01 NOTE — Telephone Encounter (Signed)
Hello,    [name] for [facility] (evening nurse) called to inform you that Gloria Ray's home care is going to be delayed until 06/02/2010.

## 2011-06-06 ENCOUNTER — Ambulatory Visit: Payer: Self-pay | Admitting: Nurse Practitioner

## 2011-06-06 ENCOUNTER — Encounter: Payer: Self-pay | Admitting: Gastroenterology

## 2011-06-06 ENCOUNTER — Other Ambulatory Visit: Payer: Self-pay | Admitting: Family Medicine

## 2011-06-06 DIAGNOSIS — E559 Vitamin D deficiency, unspecified: Secondary | ICD-10-CM

## 2011-06-06 MED ORDER — ERGOCALCIFEROL 50000 UNIT PO CAPS *I*
50000.0000 [IU] | ORAL_CAPSULE | ORAL | Status: AC
Start: 2011-06-08 — End: 2011-12-05

## 2011-06-08 ENCOUNTER — Encounter: Payer: Self-pay | Admitting: Gastroenterology

## 2011-06-08 DIAGNOSIS — T383X5A Adverse effect of insulin and oral hypoglycemic [antidiabetic] drugs, initial encounter: Secondary | ICD-10-CM | POA: Insufficient documentation

## 2011-06-08 MED ORDER — LOVASTATIN 10 MG PO TABS *I*
10.0000 mg | ORAL_TABLET | Freq: Every day | ORAL | Status: AC
Start: 2011-06-08 — End: 2011-12-05

## 2011-06-11 ENCOUNTER — Encounter: Payer: Self-pay | Admitting: Family Medicine

## 2011-06-11 NOTE — Assessment & Plan Note (Addendum)
Acute, patient has no food reserves or access to services at present.  Will get assistance check in 5 days, no current access to help from friends or family.   Consult to Social work for Psychologist, occupational with patient and located food pantries near patient residence- patient reports that she is home bound but fiance [name] can walk or ride bus, bus passes would help   Elder Source contact info [phone] identified per [employer] (211 not available on patient's phone [employer]) so called [phone])

## 2011-06-11 NOTE — Assessment & Plan Note (Addendum)
Uncontrolled DM II on insulin therapy.  Adherent to insulin regimen with help of fiance Undra.  Experiencing intermittent hypoglycemia due to lack of food and subsequent change in eating habits.  Non-adherent to diabetic diet recommendations.  Non-adherent to activity recommendations, complicated by dyspnea on exertion.     Recommend 1/2 Lantus dose and hold short acting insulin when unable to eat for long periods of time to avoid hypoglycemia   Re-establish 4x daily insulin checks for insulin titration   3AM BG check to r/o Somogyi effect   Re-establish [facility] for assistance with diabetes management   Home visits until [facility] re-established   Referral to diabetes educator

## 2011-06-11 NOTE — Assessment & Plan Note (Addendum)
Unlikely need for referral to opthalmology, will re-evaluate and re-consider referral if complaints are not remedied by change in reading glasses prescription.   Ambulatory referral to optometry

## 2011-06-11 NOTE — Assessment & Plan Note (Signed)
Poor general dentition.  Multiple teeth with caries, apparent gingival disease but no purulence or symptoms suggesting current dental infection.   Ambulatory referral to dentistry

## 2011-06-13 ENCOUNTER — Encounter: Payer: Self-pay | Admitting: Gastroenterology

## 2011-06-15 ENCOUNTER — Telehealth: Payer: Self-pay

## 2011-06-15 NOTE — Telephone Encounter (Signed)
BGs at 1:30PM visit were 559. Pt asked to clean BG machine and wash hands and BG was still 539. When asked pt had not taken her meds yet that day, and this was the first BG of the day and it was NOT fasting, it was 2 hours after her last meal. Per BG machine BG's have been:    4/7   303    234P  4/6   293    1259P  4/5   190    1036A           160   1008A  3/31  209    1018A      Pt is very non-compliant with meds and BG checks.Also c/o knee pain 10/10. Verified with [facility] pt Humalog and Lantus doses    Pt was otherwise asymptomatic, though somewhat SOB. O2 97% at rest and 94% with exertion.

## 2011-06-16 ENCOUNTER — Encounter: Payer: Self-pay | Admitting: Nurse Practitioner

## 2011-06-16 ENCOUNTER — Ambulatory Visit: Payer: Self-pay | Admitting: Nurse Practitioner

## 2011-06-16 ENCOUNTER — Other Ambulatory Visit: Payer: Self-pay | Admitting: Family Medicine

## 2011-06-16 ENCOUNTER — Telehealth: Payer: Self-pay

## 2011-06-16 VITALS — BP 130/92 | HR 98 | Wt 235.0 lb

## 2011-06-16 DIAGNOSIS — IMO0001 Reserved for inherently not codable concepts without codable children: Secondary | ICD-10-CM

## 2011-06-16 DIAGNOSIS — I2510 Atherosclerotic heart disease of native coronary artery without angina pectoris: Secondary | ICD-10-CM

## 2011-06-16 DIAGNOSIS — J449 Chronic obstructive pulmonary disease, unspecified: Secondary | ICD-10-CM

## 2011-06-16 DIAGNOSIS — I10 Essential (primary) hypertension: Secondary | ICD-10-CM

## 2011-06-16 MED ORDER — FUROSEMIDE 20 MG PO TABS *I*
20.0000 mg | ORAL_TABLET | Freq: Every morning | ORAL | Status: AC
Start: 2011-06-16 — End: 2011-12-13

## 2011-06-16 MED ORDER — BUDESONIDE-FORMOTEROL FUMARATE 160-4.5 MCG/ACT IN AERO *I*
2.0000 | INHALATION_SPRAY | Freq: Two times a day (BID) | RESPIRATORY_TRACT | Status: DC
Start: 2011-06-16 — End: 2011-12-21

## 2011-06-16 MED ORDER — TRAZODONE HCL 300 MG PO TABS *A*
300.0000 mg | ORAL_TABLET | Freq: Every evening | ORAL | Status: DC
Start: 2011-06-16 — End: 2011-11-17

## 2011-06-16 MED ORDER — STARCH (THICKENING) PO PACK *A*
PACK | ORAL | Status: DC
Start: 2011-06-16 — End: 2011-09-26

## 2011-06-16 NOTE — Telephone Encounter (Signed)
patient came in requesting a "thickner " to put into her water. She will need this sent to her pharmacy.

## 2011-06-17 NOTE — Progress Notes (Signed)
Subjective:     Patient ID: Gloria Ray is a 56 y.o. female.    HPI Here to discuss diabetes management. Referred by the nurse.  2 days ago called the office with a fingerstick blood sugar of 559 2 hours after a meal.    Patient diagnosed in her 29s.  States she did not take the diagnosis or treatment seriously for a number of years.  She lives with her husband.  She has had a stroke with residual weakness.  Her husband does the shopping and cooking.  She typically gets up about 10 AM.  First  eats about 1-2 PM.  Dinner 6-7 PM.  Sometimes Eats between meals.  Likes to snack at night (baloney sandwich). Bedtime 10-11 PM.  Activity Limited. Drinks Kool-Aid, a whole one and punch, juice and water. Since her stroke she has difficulty swallowing and uses Thick-It in all fluids.    Currently taking Lantus insulin in the evening, 48 units.  Fasting blood sugars generally over 200, lowest was 160.  States she is using Humalog 28 units before meals.  Denies any hypoglycemic reactions.  Doesn't like checking blood sugars.    Recent lipid panel elevated with LDL of 181. She recently started lovastatin 10 mg.  Denies any problems with medication.    She is taking amlodipine and losartan for her blood pressure.  Denies any problems with medication.    Uses Combivent daily.  States she has not recently needed additional albuterol.    She is using Nicoderm to help her with smoking cessation.  Has decreased number of cigarettes, uses patch intermittently.    Patient's medications, allergies, past medical, surgical, social and family histories were reviewed and updated as appropriate.    Review of Systems   Constitutional: Negative for activity change, appetite change, fatigue and unexpected weight change.   HENT: Positive for trouble swallowing.    Respiratory: Negative for chest tightness and shortness of breath.    Cardiovascular: Negative for chest pain and leg swelling.   Gastrointestinal: Positive for abdominal  distention. Negative for nausea, abdominal pain, diarrhea and constipation.   Genitourinary: Negative for frequency.   Musculoskeletal: Negative for myalgias.   Neurological: Positive for speech difficulty. Negative for dizziness.           Objective:   Physical Exam Pleasant 56 year old woman who is here with her husband.  Vital signs as noted.  Blood pressure is borderline elevated.  Has been up and down. Weight is up. Lungs-regular respirations are not labored, breath sounds are distant but otherwise negative. Heart regular rhythm no murmur.  No dependent edema noted.    Recent labs are reviewed with her.  A1c 8.3%, creatinine elevated at 1.02, normal urine microalbumin.  Vitamin D level 13, was recently started on ergocalciferol.          Assessment:    1. Type 2 diabetes uncontrolled    2. Hypertension    3. COPD    4. CAD    5. Hyperlipidemia        Plan:    1. Discussed interpretationA1c, Goal of A1c, fasting and postprandial blood sugars.  Risk of comorbidities with increased blood sugar.    2. Discussed role of basal insulin.  Reinforced taking consistently about the same time every night, states she will do this at 9 PM.  Uses abdomen for injections, review site rotation. She is given glucose summary sheet to track blood sugars and insulin.  Increase Lantus to 52 units  every evening.  She has a followup appointment in one week and review at that time. To bring glucose summary sheet and glucometer 2 office visits.  Discussed increasing her Lantus until her fasting blood sugars are 80-120.  Once fasting blood sugars at goal discussed focusing on Humalog and postprandial blood sugars.  Postprandial blood sugar goal 140-180.   3. Discuss diet, exercise and weight loss.  Encouraged to discontinue sugared beverage.  Discussed frequent small meals.  Using complex carbohydrates and protein.  Discussed portion size.  Discussed reading labels.   4. She is given Lasix 20 mg to take once a day.  Reevaluate at followup  in one week concerning bloating and elevated blood pressure.    Over 45 minutes spent teaching, counseling and coordinating care.

## 2011-06-19 ENCOUNTER — Encounter: Payer: Self-pay | Admitting: Gastroenterology

## 2011-06-21 ENCOUNTER — Telehealth: Payer: Self-pay

## 2011-06-21 NOTE — Telephone Encounter (Signed)
[facility] states they spoke w/Dr. Juliane Poot and are clear that pt should be getting 50 units Lantus nightly. They are now asking what dosage Humalog pt should be getting? They believe 30 units TID, but meds list states 28 units. Last visit note only mentions Lantus at 52 units and does not specify and change from 28 units of Humalog. Please verify pt dosage of Humalog so  [id number] can notify VNS. Thank You!!!

## 2011-06-22 ENCOUNTER — Ambulatory Visit: Payer: Self-pay | Admitting: Family Medicine

## 2011-06-22 ENCOUNTER — Encounter: Payer: Self-pay | Admitting: Family Medicine

## 2011-06-22 VITALS — BP 142/98 | HR 84 | Temp 97.5°F | Ht 64.17 in | Wt 231.0 lb

## 2011-06-22 DIAGNOSIS — E160 Drug-induced hypoglycemia without coma: Secondary | ICD-10-CM

## 2011-06-22 DIAGNOSIS — T383X5A Adverse effect of insulin and oral hypoglycemic [antidiabetic] drugs, initial encounter: Secondary | ICD-10-CM

## 2011-06-22 DIAGNOSIS — IMO0001 Reserved for inherently not codable concepts without codable children: Secondary | ICD-10-CM

## 2011-06-22 DIAGNOSIS — J449 Chronic obstructive pulmonary disease, unspecified: Secondary | ICD-10-CM

## 2011-06-22 MED ORDER — OXYCODONE-ACETAMINOPHEN 5-325 MG PO TABS *I*
1.0000 | ORAL_TABLET | ORAL | Status: DC | PRN
Start: 2011-06-22 — End: 2011-08-01

## 2011-06-22 MED ORDER — INSULIN LISPRO (HUMAN) 100 UNIT/ML SC SOLN PEN *A*
30.0000 [IU] | Freq: Three times a day (TID) | SUBCUTANEOUS | Status: DC
Start: 2011-06-22 — End: 2011-12-11

## 2011-06-22 MED ORDER — INSULIN GLARGINE 100 UNIT/ML SC SOLN PEN *A*
80.0000 [IU] | Freq: Every evening | SUBCUTANEOUS | Status: DC
Start: 2011-06-22 — End: 2011-12-11

## 2011-06-22 MED ORDER — GLUCOSE 40 % PO GEL *I*
15.0000 g | Freq: Once | ORAL | Status: DC | PRN
Start: 2011-06-22 — End: 2011-09-25

## 2011-06-22 NOTE — Progress Notes (Signed)
Assessment & Plan        Gloria Ray is a 56 y.o. female and  has a past medical history of Asthma; COPD (chronic obstructive pulmonary disease); Coronary artery disease; Diabetes mellitus; Hypertension; Ischemic Stroke (03/06/2006); and Prior Myocardial Infarction (03/19/2007).    Lack of food    Resolved, received assistance from elder care acutely and situation is now reported as stable.    Hypoglycemia due to insulin    Resolved with resolution of acute lack of food, return to regular diet.  Has glucose gel available for future episodes if necessary.    Type II, uncontrolled    Previously hypoglycemic due to lack of food, reduced insulin in response, now hyperglycemic.  Previous insulin dosing Lantus 55 Units BID, Humalog 30 Units ac.   Titrate insulin to glycemic control.    Split Lantus dosing 2 sites or BID; total of 80 Units (eg 40 Units BID)   Humalog to 30 Units ac except if not eating    renew Rx for glucose gel   [facility] to call in fasting glucose readings    Chronic Obstructive Pulmonary Disease    Requires assistance for energy costs, environment control.   Completed medical section of forms   Referral to Social Work for form assistance           Subjective   Chief Complaint: Diabetes, Hypertension and Chronic Obstruction Pulmonary Disease    HPI  1. Lack of food        Acute issue, now resolved.  Ran out of money and food 5 days prior to receiving next check.  Reports that this scared her, that she and fiance Gloria Ray are now stockpiling food so this will not happen again.     2. Hypoglycemia due to insulin        Resolved with return to regular diet.  She has experienced 0 episodes of hypoglycemia since receiving food assistance and her check, now is consistently hyperglycemic.   3. Type II, uncontrolled             Patient reports taking Lantus and Humalog as prescribed missing 0/7 days last week and a few times last month prior to hypoglycemic episodes.  No adverse medication effects at this  time, notes 0 episodes of hypoglycemia since resuming regular diet.  She is checking blood glucose outside of the office but not always fasting.  She reports having glucose gel on hand for profound hypoglycemia.  Denies headache, vision changes, increased urination, nausea, vomiting, chest pain, palpitations, SOB change from baseline, dizziness, loss of consciousness or loss of motor function.   4. Chronic Obstructive Pulmonary Disease     Requesting support letter for Energy assistance.  She has had multiple exacerbations in the past due to elevated enviromental temperatures during the summer with no air conditioning, and exacerbations during the winter due to lack of heat, unable to pay [employer] bills.     ROS      Pertinent positives and negatives of a complete review of systems noted above.         Objective   Recorded Vital Signs:   BP 142/98  Pulse 84  Temp(Src) 36.4 C (97.5 F) (Temporal)  Ht 1.63 m (5' 4.17")  Wt 104.781 kg (231 lb)  BMI 39.44 kg/m2  SpO2 96%         Physical Exam   VITALS: Reviewed above and borderline hypertensive, SpO2 normal.  BMI category is moderate to borderline morbid obesity  GEN: Cooperative with exam and interview, edentulous and speech intermittently difficult to interpret at baseline, no acute distress  HEENT: Atraumatic.  EOM normal, conjunctiva clear.  Moist mucus membranes, no oropharyngeal lesions.  Trachea midline, no lymphadenopathy or thyromegaly.  PULM: Lungs are clear to auscultation bilaterally with good effort.  No crackles, rales appreciated.  Intermittent, scattered wheezes noted.  CV: Regular rate, S1, S2.  No S3, S4, murmurs or rubs.  No JVD, no carotid bruit.  ABD: Bowel sounds normal.  Soft, non-tender, remainder of exam limited by obese abdomen.  EXT: Warm and well perfused.   SKIN: Warm and dry. No rashes, ecchymoses, or petechiae.  NEURO:  Alert.  Cranial nerves grossly intact.  PSYCH:  Patient well dressed and well groomed, sitting in wheelchair.  Speech  is difficult to understand but prosody is WNL.  Affect is flat.  Mood is euthymic.  Thought content is linear with no expressed paranoia, delusions, or hallucinations; no suicidal or homicidal ideation.  Judgement is impaired.  Insight is impaired.      [name] MD, PhD  R[facility]  06/22/2011 11:40 AM     --------------------    Preceptor Attestation - Patient Seen    I saw and evaluated the patient. I agree with the resident's/fellow's findings and plan of care as documented above. Details of my evaluation are as follows:   S: 56 yr old here for f/u Type II DM, on insulin, now eating better again and needing higher dose insulin.  COPD, social issues and obesity.  O: appears in NAD at rest; pleasant  A/P: I agree with Dr. Wallie Renshaw evaluation, assessment and plan as documented in his note above which was reviewed in its entirety.     Stoney Bang, MD

## 2011-06-22 NOTE — Assessment & Plan Note (Signed)
Resolved, received assistance from elder care acutely and situation is now reported as stable.

## 2011-06-26 ENCOUNTER — Other Ambulatory Visit: Payer: Self-pay

## 2011-06-26 MED ORDER — ALBUTEROL SULFATE (2.5 MG/3ML) 0.083% IN NEBU *I*
2.5000 mg | INHALATION_SOLUTION | RESPIRATORY_TRACT | Status: DC | PRN
Start: 2011-06-26 — End: 2011-11-21

## 2011-06-26 NOTE — Telephone Encounter (Signed)
Was changed to 50 Units Lantus qhs and 28 Units Humalog TID for hypoglycemia. Now needs to titrate up.

## 2011-06-26 NOTE — Telephone Encounter (Signed)
Pt is all out.

## 2011-06-26 NOTE — Telephone Encounter (Signed)
What should pt titrate up to and what are the parameters for the change?  Please advise [id number] pool so pt can be notified. Thank You!!!

## 2011-06-27 ENCOUNTER — Telehealth: Payer: Self-pay

## 2011-06-27 NOTE — Telephone Encounter (Signed)
Patient calling in stating that she needs to have her inhaler, and that you need to call the insurance company for this to be filled. I only saw nebulizer solution on her medication list. Could you please call her to clarify.

## 2011-06-27 NOTE — Telephone Encounter (Signed)
[facility] calling to confirm dosing of following order:    albuterol (PROVENTIL) (2.5 mg/76mL) 0.083% nebulizer solution 20 vial 3 06/26/2011 Sig - Route: Take 3 mLs (2.5 mg total) by nebulization every 2 hours as needed for Wheezing or Shortness of Breath - Nebulization

## 2011-06-27 NOTE — Telephone Encounter (Signed)
Lantus is now 80 Units (split in 2 doses/sites 40 Units) and Humalog 30 U. Lantus can be titrated up in 10 Unit incriments to achieve fasting BGs of 100-120.     [name] MD, PhD

## 2011-06-27 NOTE — Telephone Encounter (Signed)
Regina notified.

## 2011-06-29 ENCOUNTER — Telehealth: Payer: Self-pay

## 2011-06-29 NOTE — Telephone Encounter (Signed)
Regina from [facility] called stating:   pt requesting refills for her duoneb and her albuterol.  Fidelis is asking for a letter or phone call verifying reason for Thick It.    Pt is keeping BG log.  Fasting BGs as follows:  4/24-179  4/23-140  4/22-300  4/21-170  4/20-267  4/19-200

## 2011-07-03 ENCOUNTER — Ambulatory Visit: Payer: Self-pay | Admitting: Family Medicine

## 2011-07-09 NOTE — Assessment & Plan Note (Signed)
Resolved with resolution of acute lack of food, return to regular diet.  Has glucose gel available for future episodes if necessary.

## 2011-07-09 NOTE — Assessment & Plan Note (Signed)
Requires assistance for energy costs, environment control.   Completed medical section of forms   Referral to Social Work for form assistance

## 2011-07-09 NOTE — Assessment & Plan Note (Addendum)
Previously hypoglycemic due to lack of food, reduced insulin in response, now hyperglycemic.  Previous insulin dosing Lantus 55 Units BID, Humalog 30 Units ac.   Titrate insulin to glycemic control.    Split Lantus dosing 2 sites or BID; total of 80 Units (eg 40 Units BID)   Humalog to 30 Units ac except if not eating    renew Rx for glucose gel   [facility] to call in fasting glucose readings

## 2011-07-11 ENCOUNTER — Ambulatory Visit: Payer: Self-pay | Admitting: Family Medicine

## 2011-07-21 ENCOUNTER — Ambulatory Visit: Payer: Self-pay | Admitting: Family Medicine

## 2011-07-21 ENCOUNTER — Encounter: Payer: Self-pay | Admitting: Family Medicine

## 2011-07-21 VITALS — BP 150/93 | HR 93 | Temp 97.2°F | Ht 64.17 in | Wt 224.0 lb

## 2011-07-21 DIAGNOSIS — IMO0001 Reserved for inherently not codable concepts without codable children: Secondary | ICD-10-CM

## 2011-07-21 DIAGNOSIS — M25562 Pain in left knee: Secondary | ICD-10-CM

## 2011-07-21 DIAGNOSIS — M1710 Unilateral primary osteoarthritis, unspecified knee: Secondary | ICD-10-CM | POA: Insufficient documentation

## 2011-07-21 DIAGNOSIS — M25561 Pain in right knee: Secondary | ICD-10-CM

## 2011-07-21 MED ORDER — HYDROCODONE-ACETAMINOPHEN 10-500 MG PO TABS *A*
1.0000 | ORAL_TABLET | Freq: Four times a day (QID) | ORAL | Status: DC | PRN
Start: 2011-07-21 — End: 2011-07-28

## 2011-07-21 MED ORDER — ACETAMINOPHEN 500 MG PO TABS *I*
1000.0000 mg | ORAL_TABLET | Freq: Four times a day (QID) | ORAL | Status: DC | PRN
Start: 2011-07-21 — End: 2011-08-01

## 2011-07-21 MED ORDER — MORPHINE SULFATE ER 15 MG PO TBCR *I*
15.0000 mg | ORAL_TABLET | Freq: Two times a day (BID) | ORAL | Status: DC
Start: 2011-07-21 — End: 2011-07-28

## 2011-07-21 NOTE — Assessment & Plan Note (Addendum)
S/O:       Patient reports taking Lantus 80 Units qhs and Humalog 28 Units ac as prescribed 7/7 days last week and 0 times last month.  No adverse medication effects at this time, notes 0 episodes of hypoglycemia.  Is checking blood glucose outside of the office but does not have meter at visit today.  Discussed ways of logging at home, does not have a calendar but would try if a calendar would be available for her.  Denies headache, vision changes, increased urination, nausea, vomiting, chest pain, palpitations, SOB, dizziness, loss of consciousness or loss of motor function.         Last HbA1c was 8.3 on 05/29/2011.         ACEi/ARB: losartan.         Last foot exam was: unknown.         Last eye exam was: unknown.         Pneumovax and HepB vaccine status: 03/29/2011 and unknown respectively.  A/P:    Improving control.  Previous insulin dosing Lantus 55 Units BID, Humalog 30 Units ac.   Increase Lantus dosing 2 sites or BID; total of 90 Units (eg 45 Units BID)   Titrate insulin to glycemic control.  Patient increasing activity, will titrate down for hypoglycemia   Humalog to 30 Units ac except if not eating   Patient instructed to bring meter to next visit, consider giving calender to record data for visits   Referral to Podiatry at next visit for diabetic foot care   Referral to Opthalmology at next visit for diabetic eye care   Consider/counsel HepB at next visit   F/u in 2mo, due for labs   Introduction to Care Coordinator at next visit

## 2011-07-21 NOTE — Progress Notes (Signed)
Subjective     Chief Complaint: Knee Pain, Back Pain and Medication Refill  HPI  1. Type II, uncontrolled       Patient reports taking Lantus 80 Units qhs and Humalog 28 Units ac as prescribed 7/7 days last week and 0 times last month.  No adverse medication effects at this time, notes 0 episodes of hypoglycemia.  Is checking blood glucose outside of the office but does not have meter at visit today.  Discussed ways of logging at home, does not have a calendar but would try if a calendar would be available for her.  Denies headache, vision changes, increased urination, nausea, vomiting, chest pain, palpitations, SOB, dizziness, loss of consciousness or loss of motor function.         Last HbA1c was 8.3 on 05/29/2011.         ACEi/ARB: losartan.         Last foot exam was: unknown.         Last eye exam was: unknown.         Pneumovax and HepB vaccine status: unknown.    2. Knee pain, bilateral        Significantly improved with opiate analgesics, able to get out and be functional again.  Is willing to discuss injections but this is a new idea to her.  Requests bridge medication to increase walking and functionality to discuss options and long term management in depth next visit.    ROS    Pertinent positives and negatives of a focused review of systems noted above.      Objective     Recorded Vital Signs:    BP 150/93  Pulse 93  Temp(Src) 36.2 C (97.2 F) (Temporal)  Ht 1.63 m (5' 4.17")  Wt 101.606 kg (224 lb)  BMI 38.24 kg/m2   Physical Exam   VITALS: Reviewed above and hypertensive, otherwise WNL.  BMI category is moderate obesity.  GEN: Well developed, well nourished, appears stated age, no acute distress  HEENT: Atraumatic.  EOM normal, conjunctiva clear.  Moist mucus membranes.  Trachea midline, no lymphadenopathy or thyromegaly.  PULM: Lungs are clear to auscultation bilaterally with good effort.  No crackles, rales, or wheezes appreciated.  CV: Regular rate, S1, S2.  No S3, S4, murmurs or rubs.  No  JVD, no carotid bruit.  EXT: Warm and well perfused.  No edema.   SKIN: Warm and dry. No rashes, ecchymoses, or petechiae.  NEURO:  Alert.  Cranial nerves grossly intact.  Strength appears normal and symmetric.  Gait is slow, uses cane.    Laboratory data   Lab Results   Component Value Date    HA1C 8.3* 05/29/2011    HA1C 10.6* 03/27/2011    HA1C 10.5* 11/25/2010    MALBR 1.73 03/27/2011    CREAT 1.02* 05/29/2011    LDLC 181 05/29/2011       Assessment & Plan        Gloria Ray is a 56 y.o. female and  has a past medical history of Asthma; COPD (chronic obstructive pulmonary disease); Coronary artery disease; Diabetes mellitus; Hypertension; Ischemic Stroke (03/06/2006); and Prior Myocardial Infarction (03/19/2007).    Type II, uncontrolled    Improving control.  Previous insulin dosing Lantus 55 Units BID, Humalog 30 Units ac.   Increase Lantus dosing 2 sites or BID; total of 90 Units (eg 45 Units BID)   Titrate insulin to glycemic control.  Patient increasing activity, will titrate down for hypoglycemia  Humalog to 30 Units ac except if not eating   Patient instructed to bring meter to next visit, consider giving calender to record data for visits   Referral to Podiatry at next visit for diabetic foot care   Referral to Opthalmology at next visit for diabetic eye care   Consider/counsel HepB at next visit   F/u in 45mo, due for labs   Introduction to Care Coordinator at next visit    Knee pain, bilateral    No previous treatement, no previous imaging known.   Xray of knees bilaterally   Amb referral to sports med, consider for injection   Short course of morphine-Contin + acetaminophen vs. hydrocodone-acetaminophen to support increased activity, bridge to injections and PT, 1-2 mo   Increase ambulation as tolerated    Gloria Patterson MD, PhD   R1 [facility]   07/21/2011  3:21 PM     Preceptor Attestation - [facility]     I have reviewed the history, exam findings, and plan of  care and discussed the care plan with the resident at the time of the visit. I agree with the resident's findings and plan of care as documented above.    Chief complaint(s) of uncontrolled DM, knee pain  Treatment plan consisting of increase lantus, refer for PT, sports med    Gloria Hazard, MD

## 2011-07-21 NOTE — Assessment & Plan Note (Addendum)
No previous treatement, no previous imaging known.   Xray of knees bilaterally   Amb referral to sports med, consider for injection   Short course of morphine-Contin + acetaminophen vs. hydrocodone-acetaminophen to support increased activity, bridge to injections and PT, 1-2 mo   Increase ambulation as tolerated

## 2011-07-28 ENCOUNTER — Other Ambulatory Visit: Payer: Self-pay | Admitting: Family Medicine

## 2011-07-28 ENCOUNTER — Ambulatory Visit
Admit: 2011-07-28 | Discharge: 2011-07-28 | Disposition: A | Discharge status: Home / Self Care | Payer: Self-pay | Source: Ambulatory Visit | Attending: Primary Care | Admitting: Primary Care

## 2011-07-28 DIAGNOSIS — M25561 Pain in right knee: Secondary | ICD-10-CM

## 2011-07-28 DIAGNOSIS — M25562 Pain in left knee: Secondary | ICD-10-CM

## 2011-07-28 NOTE — Telephone Encounter (Signed)
Patient states that her knees are killing her and if you could please refill asap. Thank you.

## 2011-08-01 ENCOUNTER — Telehealth: Payer: Self-pay | Admitting: Family Medicine

## 2011-08-01 MED ORDER — HYDROCODONE-ACETAMINOPHEN 10-500 MG PO TABS *A*
1.0000 | ORAL_TABLET | Freq: Every evening | ORAL | Status: DC | PRN
Start: 2011-08-01 — End: 2011-08-18

## 2011-08-01 MED ORDER — MORPHINE SULFATE ER 15 MG PO TBCR *I*
30.0000 mg | ORAL_TABLET | Freq: Two times a day (BID) | ORAL | Status: DC
Start: 2011-07-28 — End: 2011-08-02

## 2011-08-01 MED ORDER — ACETAMINOPHEN 500 MG PO TABS *I*
ORAL_TABLET | ORAL | Status: DC
Start: 2011-08-01 — End: 2011-12-01

## 2011-08-01 MED ORDER — MELOXICAM 15 MG PO TABS *I*
15.0000 mg | ORAL_TABLET | Freq: Every day | ORAL | Status: DC
Start: 2011-08-01 — End: 2011-11-08

## 2011-08-01 NOTE — Telephone Encounter (Signed)
Pt with appointment 6/14 and moderate knee pathology, requesting analgesia until appointment.  Increasing activity, transitioning to NSAIDs, cortisone join injections.

## 2011-08-02 ENCOUNTER — Telehealth: Payer: Self-pay

## 2011-08-02 MED ORDER — MORPHINE SULFATE ER 15 MG PO TBCR *I*
30.0000 mg | ORAL_TABLET | Freq: Two times a day (BID) | ORAL | Status: DC
Start: 2011-08-02 — End: 2011-08-18

## 2011-08-02 MED ORDER — BAYER CONTOUR NEXT TEST VI STRP *A*
ORAL_STRIP | Status: DC
Start: 2011-08-02 — End: 2011-11-21

## 2011-08-02 NOTE — Telephone Encounter (Signed)
Message copied by Carmell Austria on Wed Aug 02, 2011 10:33 AM  ------       Message from: [place], [place]       Created: Sun Jul 30, 2011  9:59 PM         A: Mild to moderate arthritis in knees bilaterally       P:          - Nursing call to inform         - May request call or MyChart message from MD with questions         - Discuss at f/u appointment         ------

## 2011-08-02 NOTE — Telephone Encounter (Signed)
Patient, Visiting Nurse, and Pharmacy requests for medication refills.  Patient reports that she has significantly increased mobiity, using cane instead of wheelchair.  Got knee X-rays done, show moderate arthritic changes.  Will discuss joint injections and sustainable chronic pain regimen at next visit, scheduled for 6/11.  Medications refilled to sufficient quantities to reach that date.    [name] MD, PhD  Family Medicine, R1  08/02/2011 5:00 PM

## 2011-08-02 NOTE — Telephone Encounter (Signed)
LMTCB for xray results.  °

## 2011-08-18 ENCOUNTER — Encounter: Payer: Self-pay | Admitting: Family Medicine

## 2011-08-18 ENCOUNTER — Ambulatory Visit: Payer: Self-pay | Admitting: Family Medicine

## 2011-08-18 VITALS — BP 138/92 | HR 109 | Ht 64.0 in | Wt 220.0 lb

## 2011-08-18 DIAGNOSIS — M25562 Pain in left knee: Secondary | ICD-10-CM

## 2011-08-18 DIAGNOSIS — M25561 Pain in right knee: Secondary | ICD-10-CM

## 2011-08-18 MED ORDER — MORPHINE SULFATE ER 15 MG PO TBCR *I*
30.0000 mg | ORAL_TABLET | Freq: Two times a day (BID) | ORAL | Status: DC
Start: 2011-08-18 — End: 2011-09-08

## 2011-08-18 MED ORDER — HYDROCODONE-ACETAMINOPHEN 10-500 MG PO TABS *A*
1.0000 | ORAL_TABLET | Freq: Every evening | ORAL | Status: DC | PRN
Start: 2011-08-18 — End: 2011-09-25

## 2011-08-18 NOTE — Progress Notes (Signed)
Subjective     Chief Complaint: Knee Pain  HPI  Knee pain, bilateral       Knee pain has been increasing for years but accelerated over the past eight months.  Pain is relieved by rest, but exacerbated by weight bearing activity.  Reports sister with "really bad" knees.  Knees hurt in the morning, takes ~59min to get warmed up.  With morphine and hydrocodone, able to dress and bathe normally, without takes estimated 1 hour to perform these activities, needs help from fiance. Only received 2 week supply at last visit anticipating f/u but was unable to schedule due to physician scheduling constraints.  Ran out of medication, notes that during this time she cannot cook standing up, dependent on others as she had been prior to morphine and hydrocodone trials.  Desires evaluation for injections and possible knee replacement, goal is to be functionally independent, exercise, visit outside the house.    ROS    Pertinent positives and negatives of a focused review of systems noted above.      Objective     Recorded Vital Signs:    BP 138/92  Pulse 109  Ht 1.626 m (5\' 4" )  Wt 99.791 kg (220 lb)  BMI 37.74 kg/m2   Physical Exam   VITALS: Reviewed above and tachycardic, well controlled HTN.  BMI category is moderate obesity.  GEN: Well developed, well nourished, appears stated age, no acute distress  HEENT: Atraumatic.  EOM normal, conjunctiva clear.  Moist mucus membranes, no oropharyngeal lesions.  Trachea midline, no lymphadenopathy or thyromegaly.  PULM: Lungs are clear to auscultation bilaterally with good effort.  No crackles, rales, or wheezes appreciated.  CV: Regular rate, S1, S2.  No S3, S4, murmurs or rubs.  ABD: Soft, non-tender.  EXT: Warm and well perfused.  No edema.  Radial and dorsalis pedis pulses palpable.  SKIN: Warm and dry. No rashes, ecchymoses, or petechiae.  NEURO:  Alert.  Cranial nerves grossly intact.  Strength is normal and symmetric.  Gait is slow, assisted by cane.  Balance is normal.         Assessment & Plan        Gloria Ray is a 56 y.o. female and  has a past medical history of Asthma; COPD (chronic obstructive pulmonary disease); Coronary artery disease; Diabetes mellitus; Hypertension; Ischemic Stroke (03/06/2006); and Prior Myocardial Infarction (03/19/2007).    Knee pain, bilateral      Bilateral tricompartmental arthritis with significant functional/ADL impairment.  Goal of increasing independence and activity for weight reduction.   Referral to procedure clinic for joint injection   Refill MSContin 30mg  BID and hydrocodone 10mg  qhs until injections   Continue meloxicam 15mg  daily and acetaminophen (max 3000mg  daily)    Consider Ortho referral      [name] MD, PhD   R1 [facility]   08/18/2011  5:15 PM     Preceptor Attestation - [facility] Medicine     I have reviewed the history, exam findings, and plan of care and discussed the care plan with the resident at the time of the visit. I agree with the resident's findings and plan of care as documented above.    Chief complaint(s) of knee OA, bilateral, opioid dependent  Treatment plan consisting of refill meds, encourage exercise as able, weight loss, schedule injection    [name], MD

## 2011-08-18 NOTE — Assessment & Plan Note (Signed)
S/O:       Knee pain has been increasing for years but accelerated over the past eight months.  Pain is relieved by rest, but exacerbated by weight bearing activity.  Reports sister with "really bad" knees.  Knees hurt in the morning, takes ~84min to get warmed up.  With morphine and hydrocodone, able to dress and bathe normally, without takes estimated 1 hour to perform these activities, needs help from fiance. Only received 2 week supply at last visit anticipating f/u but was unable to schedule due to physician scheduling constraints.  Ran out of medication, notes that during this time she cannot cook standing up, dependent on others as she had been prior to morphine and hydrocodone trial.  Desires evaluation for injections and possible knee replacement, goal is to be functionally indendent, exercise, visit outside the house.    A/P:      Bilateral tricompartmental arthritis with significant functional impairment.   Referral to procedure clinic for joint injection   Refill MSContin and hydrocodone for qhs until injections    Consider Ortho referral

## 2011-08-22 ENCOUNTER — Encounter: Payer: Self-pay | Admitting: Nutrition

## 2011-08-22 ENCOUNTER — Encounter: Payer: Self-pay | Admitting: Gastroenterology

## 2011-08-22 ENCOUNTER — Ambulatory Visit: Payer: Self-pay | Admitting: Nutrition

## 2011-08-30 ENCOUNTER — Ambulatory Visit: Payer: Self-pay | Admitting: Endocrinology

## 2011-08-30 ENCOUNTER — Telehealth: Payer: Self-pay | Admitting: Primary Care

## 2011-08-30 ENCOUNTER — Ambulatory Visit: Payer: Self-pay

## 2011-08-30 ENCOUNTER — Telehealth: Payer: Self-pay | Admitting: Endocrinology

## 2011-08-30 NOTE — Telephone Encounter (Signed)
Called patient as she did not show for appointment today at [facility].  Patient stated she did not have a ride to office today.  Rescheduled for September 13, 2011 at 11:00 am.  Wendelyn Breslow, RN

## 2011-08-30 NOTE — Telephone Encounter (Signed)
Hello,    Gloria Ray called about a dental form that was sent to [place] last week. Do you have this form?    Thanks,

## 2011-08-31 ENCOUNTER — Other Ambulatory Visit: Payer: Self-pay | Admitting: Gastroenterology

## 2011-08-31 ENCOUNTER — Emergency Department
Admission: EM | Admit: 2011-08-31 | Disposition: A | Discharge status: Home / Self Care | Payer: Self-pay | Source: Ambulatory Visit | Attending: Emergency Medicine | Admitting: Emergency Medicine

## 2011-08-31 ENCOUNTER — Telehealth: Payer: Self-pay

## 2011-08-31 LAB — POCT GLUCOSE
Glucose POCT: 124 mg/dL — ABNORMAL HIGH (ref 60–99)
Glucose POCT: 143 mg/dL — ABNORMAL HIGH (ref 60–99)

## 2011-08-31 NOTE — Discharge Instructions (Signed)
Continue on all of your current medication and care plan  Follow up with your doctor in the next few days  Return if further problem or concern

## 2011-08-31 NOTE — ED Notes (Signed)
BG 30's-40's given oral glucose, D50 given 12.5 GM repeat BG 177. Recent change in Lantus dose. Increasing SOB X 2 days using inhaler at home frequently. Air conditioner at home.

## 2011-08-31 NOTE — ED Notes (Signed)
Pt reports that when she took her BG tonight it was 38. Pt took 20 units of lantus prior. Pt given oral glucose and 1/2 amp of D50 by EMS. Pt is alert and oriented. Skin warm, pink and dry. Pt reports that she has had increased SOB over past few days. Pt has hx of COPD and asthma. Pt received 1 duo neb by EMS. Pt speaking in full sentences. Denies CP.

## 2011-08-31 NOTE — ED Notes (Signed)
Pt given boxed lunch

## 2011-08-31 NOTE — Telephone Encounter (Signed)
VNS reports that patients BP today was 156/100 - asymptomatic  . Glucose 46 - patient has not eaten much this week as she used up her food stamps for the month. Nurse is going to have social work assessment done.

## 2011-08-31 NOTE — ED Provider Notes (Signed)
History     Chief Complaint   Patient presents with   . Hypoglycemia   . Shortness of Breath     HPI Comments: Reported to have increased dosage of lantus in the recent weeks. Noted to have low bg this evening p giving herself novolog and dinner. Pt was given sweetened beverage by SO. The bg was in the 100+ range when pt was concern with the rise of value and gave herself 20U of insulin. Overtime, pt noted to be more confuse and hypoglycemic. EMS found pt to have bg of 30-40. D50 was given with improvement.    Currently pt is comfortable and in no acute distress.    Patient is a 56 y.o. female presenting with altered mental status. The history is provided by the patient and the spouse.   Altered Mental Status   The current episode started 1 to 2 hours ago. Associated symptoms include confusion. Pertinent negatives include no somnolence, no seizures, no unresponsiveness, no weakness, no agitation, no delusions, no hallucinations, no self-injury and no violence. Risk factors include the patient not taking medications correctly. Her past medical history is significant for diabetes.       Past Medical History   Diagnosis Date   . Asthma    . COPD (chronic obstructive pulmonary disease)    . Coronary artery disease    . Diabetes mellitus    . Hypertension    . Ischemic Stroke 03/06/2006     Created by Conversion    . Prior Myocardial Infarction 03/19/2007     Created by Conversion             Past Surgical History   Procedure Laterality Date   . Coronary angioplasty with stent placement         Family History   Problem Relation Age of Onset   . Diabetes Other    . Heart disease Other    . Hypertension Other    . Coronary art dis Other          Social History      reports that she has been smoking Cigarettes.  She has been smoking about 0.00 packs per day. She has never used smokeless tobacco. She reports that she does not drink alcohol. His drug and sexual activity histories not on file.    Living Situation    Questions  Responses    Patient lives with Child    Homeless     Caregiver for other family member     External Services     Employment Retired    Domestic Violence Risk           Review of Systems   Review of Systems   Neurological: Negative for seizures and weakness.   Psychiatric/Behavioral: Positive for confusion and altered mental status. Negative for hallucinations, self-injury and agitation.   All other systems reviewed and are negative.        Physical Exam       ED Triage Vitals   BP Heart Rate Resp Temp Temp Source SpO2 O2 Device O2 Flow Rate weight   08/31/11 2240 08/31/11 2240 08/31/11 2240 08/31/11 2240 08/31/11 2240 08/31/11 2240 08/31/11 2240 -- 08/31/11 2240   152/100 mmHg 92  20  36.3 C (97.3 F) TEMPORAL 100 % None (Room air)  99.791 kg (220 lb)       Physical Exam   Nursing note and vitals reviewed.  Constitutional: She is oriented to person, place, and time. She  appears well-developed and well-nourished. No distress.   Well appearing and in no distress     HENT:   Head: Normocephalic and atraumatic.   Nose: Nose normal.   Eyes: Conjunctivae and EOM are normal.   Neck: Normal range of motion. Neck supple.   Cardiovascular: Normal rate.    Pulmonary/Chest: Effort normal. No respiratory distress.   Abdominal: Soft.   Musculoskeletal: Normal range of motion.   Neurological: She is alert and oriented to person, place, and time. No cranial nerve deficit.   Skin: Skin is warm and dry.   Psychiatric: She has a normal mood and affect. Her behavior is normal. Judgment and thought content normal.       Medical Decision Making      Amount and/or Complexity of Data Reviewed  Clinical lab tests: ordered and reviewed  Tests in the medicine section of CPT: ordered and reviewed  Decide to obtain previous medical records or to obtain history from someone other than the patient: yes  Review and summarize past medical records: yes  Independent visualization of images, tracings, or specimens: yes        Initial  Evaluation:  ED First Provider Contact    Date/Time Event User Comments    08/31/11 2250 ED Provider First Contact [place], Kadeen Sroka K Initial Face to Face Provider Contact          Patient seen by me on arrival date of 08/31/2011 at 2330    Assessment:  56 y.o., female comes to the ED with confusion    Differential Diagnosis includes hypoglycemia             Plan: labs, reassurance      Pt tolerated po while here. Comfortable and in no distress      Recent Results (from the past 72 hour(s))   POCT GLUCOSE    Collection Time    08/31/11 10:55 PM       Result Value Range    Glucose POCT 124 (*) 60 - 99 mg/dL   POCT GLUCOSE    Collection Time    08/31/11 11:59 PM       Result Value Range    Glucose POCT 143 (*) 60 - 99 mg/dL             Clarene Critchley, MD    Clarene Critchley, MD  09/02/11 [phone]

## 2011-09-01 ENCOUNTER — Ambulatory Visit: Payer: Self-pay | Admitting: Family Medicine

## 2011-09-02 ENCOUNTER — Encounter: Payer: Self-pay | Admitting: Emergency Medicine

## 2011-09-04 ENCOUNTER — Encounter: Payer: Self-pay | Admitting: Gastroenterology

## 2011-09-06 ENCOUNTER — Telehealth: Payer: Self-pay

## 2011-09-06 NOTE — Telephone Encounter (Signed)
VS out-of-range, but looks normal for her. Pt had been startled prior to read. Otherwise asymptomatic. Pt continues to be non-compliant with fasting BGs. Didn't produce meter when asked to, but verbally told VNS that 7/2 reading was 180. SW has been requested for this pt, orders to follow

## 2011-09-08 ENCOUNTER — Ambulatory Visit: Payer: Self-pay | Admitting: Family Medicine

## 2011-09-08 ENCOUNTER — Other Ambulatory Visit: Payer: Self-pay | Admitting: Family Medicine

## 2011-09-08 DIAGNOSIS — M25562 Pain in left knee: Secondary | ICD-10-CM

## 2011-09-08 DIAGNOSIS — M25561 Pain in right knee: Secondary | ICD-10-CM

## 2011-09-08 MED ORDER — MORPHINE SULFATE ER 15 MG PO TBCR *I*
30.0000 mg | ORAL_TABLET | Freq: Two times a day (BID) | ORAL | Status: DC
Start: 2011-09-08 — End: 2011-09-26

## 2011-09-10 LAB — EKG 12-LEAD
P: 60 degrees
QRS: 15 degrees
Rate: 90 {beats}/min
Severity: NORMAL
Severity: NORMAL
T: 40 degrees

## 2011-09-11 ENCOUNTER — Other Ambulatory Visit: Payer: Self-pay | Admitting: Family Medicine

## 2011-09-13 ENCOUNTER — Telehealth: Payer: Self-pay | Admitting: Family Medicine

## 2011-09-13 ENCOUNTER — Ambulatory Visit: Payer: Self-pay | Admitting: Endocrinology

## 2011-09-13 NOTE — Telephone Encounter (Signed)
Patient would like to know if you could please do a note for her for Home care regarding her needing assistance at night. Home care is sending out her paperwork to fill out this week and she would like to submit it asap. Thank you.

## 2011-09-19 ENCOUNTER — Observation Stay
Admission: EM | Admit: 2011-09-19 | Disposition: A | Discharge status: Home / Self Care | Payer: Self-pay | Source: Ambulatory Visit | Attending: Emergency Medicine | Admitting: Emergency Medicine

## 2011-09-19 ENCOUNTER — Other Ambulatory Visit: Payer: Self-pay | Admitting: Gastroenterology

## 2011-09-19 LAB — CBC AND DIFFERENTIAL
Baso # K/uL: 0 10*3/uL (ref 0.0–0.1)
Basophil %: 0.4 % (ref 0.1–1.2)
Eos # K/uL: 0.1 10*3/uL (ref 0.0–0.4)
Eosinophil %: 0.7 % (ref 0.7–5.8)
Hematocrit: 37 % (ref 34–45)
Hemoglobin: 11.4 g/dL (ref 11.2–15.7)
Lymph # K/uL: 3.3 10*3/uL (ref 1.2–3.7)
Lymphocyte %: 38.7 % (ref 19.3–51.7)
MCV: 81 fL (ref 79–95)
Mono # K/uL: 0.6 10*3/uL (ref 0.2–0.9)
Monocyte %: 6.9 % (ref 4.7–12.5)
Neut # K/uL: 4.5 10*3/uL (ref 1.6–6.1)
Platelets: 302 10*3/uL (ref 160–370)
RBC: 4.5 MIL/uL (ref 3.9–5.2)
RDW: 17.5 % — ABNORMAL HIGH (ref 11.7–14.4)
Seg Neut %: 53.3 % (ref 34.0–71.1)
WBC: 8.4 10*3/uL (ref 4.0–10.0)

## 2011-09-19 LAB — BASIC METABOLIC PANEL
Anion Gap: 10 (ref 7–16)
CO2: 27 mmol/L (ref 20–28)
Calcium: 9.5 mg/dL (ref 8.6–10.2)
Chloride: 105 mmol/L (ref 96–108)
Creatinine: 1.01 mg/dL — ABNORMAL HIGH (ref 0.51–0.95)
GFR,Black: 73 *
GFR,Caucasian: 63 *
Glucose: 43 mg/dL — ABNORMAL LOW (ref 60–99)
Lab: 16 mg/dL (ref 6–20)
Potassium: 3.8 mmol/L (ref 3.3–5.1)
Sodium: 142 mmol/L (ref 133–145)

## 2011-09-19 LAB — BLOOD BANK HOLD RED

## 2011-09-19 LAB — POCT GLUCOSE: Glucose POCT: 47 mg/dL — ABNORMAL LOW (ref 60–99)

## 2011-09-19 LAB — BLOOD BANK HOLD LAVENDER

## 2011-09-19 LAB — HOLD BLUE

## 2011-09-19 LAB — TROPONIN T: Troponin T: <0.01 ng/mL (ref 0.00–0.02)

## 2011-09-19 MED ORDER — ALBUTEROL SULFATE (2.5 MG/3ML) 0.083% IN NEBU *I*
2.5000 mg | INHALATION_SOLUTION | RESPIRATORY_TRACT | Status: DC
Start: 2011-09-19 — End: 2011-09-19
  Administered 2011-09-19: 2.5 mg via RESPIRATORY_TRACT
  Filled 2011-09-19: qty 3

## 2011-09-19 MED ORDER — IPRATROPIUM BROMIDE 0.02 % IN SOLN *I*
500.0000 ug | RESPIRATORY_TRACT | Status: DC
Start: 2011-09-19 — End: 2011-09-19
  Administered 2011-09-19: 500 ug via RESPIRATORY_TRACT
  Filled 2011-09-19: qty 2.5

## 2011-09-19 MED ORDER — ASPIRIN 81 MG PO CHEW *I*
324.0000 mg | CHEWABLE_TABLET | Freq: Once | ORAL | Status: AC
Start: 2011-09-19 — End: 2011-09-19
  Administered 2011-09-19: 324 mg via ORAL
  Filled 2011-09-19: qty 4

## 2011-09-19 NOTE — ED Notes (Signed)
Pt c/p CP earlier this evening following a panic attack. Pt denies taking anti anxiety meds. Also c/o SOB at the time of CP. Pt says she has hx stents. Pt has been ambulating through ED with 0 difficulty, no c/o SOB/CP. Denies numbness/tingling in extremities.  Blood work drawn and sent.

## 2011-09-19 NOTE — ED Notes (Signed)
Pt states "I feel like my blood sugar is low". Writer did BG. BG=47, attending aware. Pt given 2 oranges, milk, and graham crackers.

## 2011-09-19 NOTE — ED Notes (Signed)
Pt given sandwich, okay to eat per provider.

## 2011-09-19 NOTE — ED Provider Notes (Addendum)
History     Chief Complaint   Patient presents with   . Chest Pain     HPI Comments: 56 y.o. F with PMH of COPD, ?CAD s/p stent, HTN, DM, CVA who presents with chest tightness and SOB. Reports that the symptoms began this evening while sitting down to watch TV. Reports the pain is the same as when she had her stents placed. Denies falls, fever, abd pain, n/v/d. Denies urinary symptoms. Given A&A by EMS with improvement in symptoms.     The history is provided by the patient and medical records. No language interpreter was used.       Past Medical History   Diagnosis Date   . Asthma    . COPD (chronic obstructive pulmonary disease)    . Coronary artery disease    . Diabetes mellitus    . Hypertension    . Ischemic Stroke 03/06/2006     Created by Conversion    . Prior Myocardial Infarction 03/19/2007     Created by Conversion             Past Surgical History   Procedure Laterality Date   . Coronary angioplasty with stent placement         Family History   Problem Relation Age of Onset   . Diabetes Other    . Heart disease Other    . Hypertension Other    . Coronary art dis Other          Social History      reports that she has been smoking Cigarettes.  She has been smoking about 0.00 packs per day. She has never used smokeless tobacco. She reports that she does not drink alcohol. His drug and sexual activity histories not on file.    Living Situation    Questions Responses    Patient lives with Child    Homeless     Caregiver for other family member     External Services     Employment Retired    Domestic Violence Risk           Review of Systems   Review of Systems   Constitutional: Negative for fever.   HENT: Negative for nosebleeds.    Eyes: Negative for visual disturbance.   Respiratory: Positive for shortness of breath.    Cardiovascular: Positive for chest pain.   Gastrointestinal: Negative for nausea, vomiting and abdominal pain.   Endocrine: Negative for polyuria.   Genitourinary: Negative for dysuria and  hematuria.   Musculoskeletal: Negative for back pain.   Skin: Negative for rash.   Allergic/Immunologic: Negative for immunocompromised state.   Neurological: Negative for syncope.   Hematological: Does not bruise/bleed easily.   Psychiatric/Behavioral: Negative for agitation.       Physical Exam     ED Triage Vitals   BP Heart Rate Heart Rate(via Pulse Ox) Resp Temp Temp src SpO2 O2 Device O2 Flow Rate   09/19/11 2030 09/19/11 2030 -- 09/19/11 2030 09/19/11 2030 -- 09/19/11 2030 09/19/11 2030 --   144/98 mmHg 83   16  35.8 C (96.4 F)  98 % None (Room air)       Weight           --                          Physical Exam   Nursing note and vitals reviewed.  Constitutional: She is oriented to person,  place, and time. She appears well-developed and well-nourished. No distress.   Sitting up, comfortable appearing   HENT:   Head: Normocephalic and atraumatic.   Mouth/Throat: Oropharynx is clear and moist. No oropharyngeal exudate.   Eyes: EOM are normal. Pupils are equal, round, and reactive to light. Right eye exhibits no discharge. Left eye exhibits no discharge.   Neck: Normal range of motion. Neck supple. No tracheal deviation present.   Cardiovascular: Normal rate, regular rhythm, normal heart sounds and intact distal pulses.  Exam reveals no gallop and no friction rub.    No murmur heard.  Pulses:       Carotid pulses are 2+ on the right side, and 2+ on the left side.       Dorsalis pedis pulses are 2+ on the right side, and 2+ on the left side.   Pulmonary/Chest: Effort normal and breath sounds normal. No respiratory distress. She has no wheezes. She has no rales.   Abdominal: Soft. Bowel sounds are normal. She exhibits no distension. There is no tenderness. There is no rebound and no guarding.   Musculoskeletal: Normal range of motion. She exhibits no edema and no tenderness.   Neurological: She is alert and oriented to person, place, and time. She exhibits normal muscle tone. Coordination normal.   Skin: Skin  is warm and dry. No rash noted. She is not diaphoretic. No erythema. No pallor.   Psychiatric: She has a normal mood and affect. Her behavior is normal. Judgment and thought content normal.       Medical Decision Making   <EDMDM>    Initial Evaluation:  ED First Provider Contact    Date/Time Event User Comments    09/19/11 2156 ED Provider First Contact Jamelle Rushing Initial Face to Face Provider Contact          Patient seen by me as above    Assessment:  56 y.o., female comes to the ED with chest pain and SOB in a patient with a history of prior CAD    Differential Diagnosis includes angina, ACS, COPD exacerbation, dysrhythmia, anemia, infection, dehydration, electrolyte abnormality    Plan:  -CBC diff  -BMP  -Troponin  -EKG  -CXR  -Tele  -Pain control as needed  -ASA   -Trial of additional A&A  -Dispo: if pain free and results are negative, will need to be transferred to obs to complete ACS evaluation    Jamelle Rushing, MD,PHD    Jamelle Rushing, MD,PHD  Resident  09/20/11 [phone]  Resident Attestation:     Patient seen by me 09/19/2011 at 2330    History:   I reviewed this patient, reviewed the resident's note and agree.  Exam:   I examined this patient, reviewed the resident's note and agree.    Decision Making:   I discussed with the resident his/her documented decision making  and agree.      Author Joanna Puff, MD    Joanna Puff, MD  10/03/11 1031

## 2011-09-19 NOTE — ED Notes (Signed)
C/o chest pain x 1 hour and SOB was given a duo neb with relief and ASA, now feels better

## 2011-09-19 NOTE — ED Notes (Signed)
Pt given food and drink.

## 2011-09-19 NOTE — Progress Notes (Signed)
Social Work Note:    Contacts:  Programme researcher, broadcasting/film/video caring for patient, patient.  Patient is a 56 yo female to [facility] ED due to chest pain and shortness of breath.  This Clinical research associate was called to help patient make a long distance phone call.    Intervention:  This Clinical research associate assisted patient in making a long distance phone call to family in [place].  Patient will need to make other calls and social work to assist as needed this evening.    Plan:  Patient is still being medically evaluated.  If patient needs to make further long distance calls, social work to assist.      Orpah Clinton, LMSW

## 2011-09-20 ENCOUNTER — Encounter: Payer: Self-pay | Admitting: Emergency Medicine

## 2011-09-20 DIAGNOSIS — E162 Hypoglycemia, unspecified: Secondary | ICD-10-CM | POA: Diagnosis present

## 2011-09-20 DIAGNOSIS — R079 Chest pain, unspecified: Secondary | ICD-10-CM | POA: Diagnosis present

## 2011-09-20 LAB — TROPONIN T
Troponin T: <0.01 ng/mL (ref 0.00–0.02)
Troponin T: <0.01 ng/mL (ref 0.00–0.02)

## 2011-09-20 LAB — POCT GLUCOSE
Glucose POCT: 201 mg/dL — ABNORMAL HIGH (ref 60–99)
Glucose POCT: 152 mg/dL — ABNORMAL HIGH (ref 60–99)

## 2011-09-20 LAB — NT-PRO BNP: NT-pro BNP: <50 pg/mL (ref 0–900)

## 2011-09-20 LAB — LACTATE, PLASMA: Lactate: 1.3 mmol/L (ref 0.5–2.2)

## 2011-09-20 MED ORDER — ALBUTEROL SULFATE (2.5 MG/3ML) 0.083% IN NEBU *I*
2.5000 mg | INHALATION_SOLUTION | RESPIRATORY_TRACT | Status: DC | PRN
Start: 2011-09-20 — End: 2011-09-22
  Administered 2011-09-20 – 2011-09-21 (×5): 2.5 mg via RESPIRATORY_TRACT
  Filled 2011-09-20 (×5): qty 3

## 2011-09-20 MED ORDER — CLOPIDOGREL BISULFATE 75 MG PO TABS *I*
75.0000 mg | ORAL_TABLET | Freq: Every day | ORAL | Status: DC
Start: 2011-09-20 — End: 2011-09-22
  Administered 2011-09-20 – 2011-09-22 (×3): 75 mg via ORAL
  Filled 2011-09-20 (×3): qty 1

## 2011-09-20 MED ORDER — ACETAMINOPHEN 325 MG PO TABS *I*
650.0000 mg | ORAL_TABLET | Freq: Once | ORAL | Status: AC
Start: 2011-09-20 — End: 2011-09-20
  Administered 2011-09-20: 650 mg via ORAL

## 2011-09-20 MED ORDER — ALBUTEROL SULFATE (2.5 MG/3ML) 0.083% IN NEBU *I*
2.5000 mg | INHALATION_SOLUTION | Freq: Four times a day (QID) | RESPIRATORY_TRACT | Status: DC
Start: 2011-09-20 — End: 2011-09-20

## 2011-09-20 MED ORDER — AMLODIPINE BESYLATE 10 MG PO TABS *I*
10.0000 mg | ORAL_TABLET | Freq: Once | ORAL | Status: AC
Start: 2011-09-20 — End: 2011-09-20
  Administered 2011-09-20: 10 mg via ORAL
  Filled 2011-09-20: qty 1

## 2011-09-20 MED ORDER — LOVASTATIN 20 MG PO TABS *I*
10.0000 mg | ORAL_TABLET | Freq: Every day | ORAL | Status: DC
Start: 2011-09-20 — End: 2011-09-22
  Administered 2011-09-20: 10 mg via ORAL
  Filled 2011-09-20 (×3): qty 1

## 2011-09-20 MED ORDER — INSULIN LISPRO (HUMAN) 100 UNIT/ML IJ/SC SOLN *WRAPPED*
30.0000 [IU] | Freq: Three times a day (TID) | SUBCUTANEOUS | Status: DC
Start: 2011-09-20 — End: 2011-09-22
  Administered 2011-09-20 – 2011-09-22 (×6): 30 [IU] via SUBCUTANEOUS

## 2011-09-20 MED ORDER — ASPIRIN 325 MG PO TBEC *I*
325.0000 mg | DELAYED_RELEASE_TABLET | Freq: Every day | ORAL | Status: DC
Start: 2011-09-20 — End: 2011-09-22
  Administered 2011-09-20 – 2011-09-22 (×3): 325 mg via ORAL
  Filled 2011-09-20 (×3): qty 1

## 2011-09-20 MED ORDER — ACETAMINOPHEN 500 MG PO TABS *I*
1000.0000 mg | ORAL_TABLET | Freq: Three times a day (TID) | ORAL | Status: DC | PRN
Start: 2011-09-20 — End: 2011-09-22
  Administered 2011-09-20: 1000 mg via ORAL
  Filled 2011-09-20 (×6): qty 2

## 2011-09-20 MED ORDER — SODIUM CHLORIDE 0.9 % INJ (FLUSH) WRAPPED *I*
3.0000 mL | Freq: Three times a day (TID) | Status: DC
Start: 2011-09-20 — End: 2011-09-22
  Administered 2011-09-20 – 2011-09-22 (×5): 3 mL via INTRAVENOUS

## 2011-09-20 MED ORDER — ALBUTEROL SULFATE (2.5 MG/3ML) 0.083% IN NEBU *I*
INHALATION_SOLUTION | RESPIRATORY_TRACT | Status: DC
Start: 2011-09-20 — End: 2011-09-20
  Administered 2011-09-20: 0
  Filled 2011-09-20: qty 3

## 2011-09-20 MED ORDER — LOSARTAN POTASSIUM 50 MG PO TABS *I*
50.0000 mg | ORAL_TABLET | Freq: Every day | ORAL | Status: DC
Start: 2011-09-20 — End: 2011-09-20
  Administered 2011-09-20: 50 mg via ORAL
  Filled 2011-09-20: qty 1

## 2011-09-20 MED ORDER — INSULIN GLARGINE 100 UNIT/ML SC SOLN *WRAPPED*
45.0000 [IU] | Freq: Two times a day (BID) | SUBCUTANEOUS | Status: DC
Start: 2011-09-20 — End: 2011-09-22
  Administered 2011-09-20 – 2011-09-22 (×4): 45 [IU] via SUBCUTANEOUS

## 2011-09-20 MED ORDER — LOSARTAN POTASSIUM 50 MG PO TABS *I*
50.0000 mg | ORAL_TABLET | Freq: Every day | ORAL | Status: DC
Start: 2011-09-21 — End: 2011-09-22
  Administered 2011-09-21 – 2011-09-22 (×2): 50 mg via ORAL
  Filled 2011-09-20 (×2): qty 1

## 2011-09-20 MED ORDER — PANTOPRAZOLE SODIUM 40 MG PO TBEC *I*
40.0000 mg | DELAYED_RELEASE_TABLET | Freq: Every morning | ORAL | Status: DC
Start: 2011-09-21 — End: 2011-09-22
  Administered 2011-09-21: 40 mg via ORAL
  Filled 2011-09-20: qty 1

## 2011-09-20 MED ORDER — IPRATROPIUM-ALBUTEROL 0.5-2.5 MG/3ML IN SOLN *I*
3.0000 mL | Freq: Four times a day (QID) | RESPIRATORY_TRACT | Status: DC
Start: 2011-09-20 — End: 2011-09-20

## 2011-09-20 MED ORDER — AMLODIPINE BESYLATE 10 MG PO TABS *I*
10.0000 mg | ORAL_TABLET | Freq: Every day | ORAL | Status: DC
Start: 2011-09-21 — End: 2011-09-22
  Administered 2011-09-21 – 2011-09-22 (×2): 10 mg via ORAL
  Filled 2011-09-20 (×2): qty 1

## 2011-09-20 MED ORDER — FUROSEMIDE 20 MG PO TABS *I*
20.0000 mg | ORAL_TABLET | Freq: Once | ORAL | Status: AC
Start: 2011-09-20 — End: 2011-09-20
  Administered 2011-09-20: 20 mg via ORAL
  Filled 2011-09-20: qty 1

## 2011-09-20 MED ORDER — FUROSEMIDE 20 MG PO TABS *I*
20.0000 mg | ORAL_TABLET | Freq: Every morning | ORAL | Status: DC
Start: 2011-09-21 — End: 2011-09-22
  Administered 2011-09-21 – 2011-09-22 (×2): 20 mg via ORAL
  Filled 2011-09-20 (×2): qty 1

## 2011-09-20 MED ORDER — ACETAMINOPHEN 325 MG PO TABS *I*
ORAL_TABLET | ORAL | Status: AC
Start: 2011-09-20 — End: 2011-09-20
  Administered 2011-09-20: 0
  Filled 2011-09-20: qty 2

## 2011-09-20 MED ORDER — DOCUSATE SODIUM 100 MG PO CAPS *I*
100.0000 mg | ORAL_CAPSULE | Freq: Two times a day (BID) | ORAL | Status: DC | PRN
Start: 2011-09-20 — End: 2011-09-22

## 2011-09-20 MED ORDER — BUDESONIDE-FORMOTEROL FUMARATE 160-4.5 MCG/ACT IN AERO *I*
2.0000 | INHALATION_SPRAY | Freq: Two times a day (BID) | RESPIRATORY_TRACT | Status: DC
Start: 2011-09-20 — End: 2011-09-22
  Administered 2011-09-20 – 2011-09-22 (×4): 2 via RESPIRATORY_TRACT
  Filled 2011-09-20: qty 6

## 2011-09-20 MED ORDER — INSULIN LISPRO (HUMAN) 100 UNIT/ML SC SOLN PEN *A*
30.0000 [IU] | Freq: Three times a day (TID) | SUBCUTANEOUS | Status: DC
Start: 2011-09-20 — End: 2011-09-20

## 2011-09-20 MED ORDER — MORPHINE SULFATE ER 30 MG PO TBCR *I*
30.0000 mg | ORAL_TABLET | Freq: Two times a day (BID) | ORAL | Status: DC
Start: 2011-09-20 — End: 2011-09-22
  Administered 2011-09-20 – 2011-09-22 (×4): 30 mg via ORAL
  Filled 2011-09-20 (×4): qty 1

## 2011-09-20 MED ORDER — TRAZODONE HCL 100 MG PO TABS *I*
300.0000 mg | ORAL_TABLET | Freq: Every evening | ORAL | Status: DC
Start: 2011-09-20 — End: 2011-09-22
  Administered 2011-09-20 – 2011-09-21 (×2): 300 mg via ORAL
  Filled 2011-09-20 (×3): qty 3

## 2011-09-20 NOTE — ED Notes (Signed)
Plan of Care     Routine meds, Nebs, labs, amb O2 sat RA, analgesics prn, observation.

## 2011-09-20 NOTE — ED Obs Notes (Signed)
ED OBSERVATION ADMISSION NOTE    Patient seen by me today, 09/20/2011 at 2:42 PM    Current patient status: Observation    History     Chief Complaint   Patient presents with   . Chest Pain     HPI Comments: The patient is a 56 yo woman who is placed in obs after evaluation in the ED for cp.  The patient states that yesterday morning her BG = 50 after breakfast.  2 h later, she started to feel shaky and nervous and she felt faint.  Her BG = 47.  She ate some jelly.  She rechecked her BG = 60.  She states that she panicked.  She was sob and she had cp.  The cp was more like her trying to breathe in.  She states that it felt as though something was blocking her airway.  She thinks that her sugar was low because she ate less than usual as she is very low on food.  She is on food stamps but only gets $200 / month.    Patient is a 56 y.o. female presenting with chest pain. The history is provided by the patient. No language interpreter was used.   Chest Pain  The chest pain began yesterday. Chest pain occurs constantly. The chest pain is unchanged. The pain is associated with eating (She feels as though her food is stuck in her esophagus.). At its most intense, the chest pain is at 8/10. The chest pain is currently at 6/10. The severity of the chest pain is moderate. The quality of the pain is described as aching. The pain does not radiate. Chest pain is worsened by eating. Primary symptoms include fatigue, shortness of breath, wheezing and dizziness. Pertinent negatives for primary symptoms include no fever, no syncope, no cough, no palpitations, no abdominal pain, no nausea and no vomiting.   Dizziness also occurs with diaphoresis. Dizziness does not occur with nausea or vomiting.     Associated symptoms include claudication, diaphoresis, orthopnea and paroxysmal nocturnal dyspnea.   Pertinent negatives for associated symptoms include no lower extremity edema and no near-syncope. She tried aspirin for the symptoms. Risk  factors include obesity, post-menopausal and lack of exercise.   Her past medical history is significant for anxiety/panic attacks, CAD, COPD, diabetes, hyperlipidemia, hypertension, MI, seizures and strokes.   Pertinent negatives for past medical history include no aneurysm, no aortic aneurysm, no aortic dissection, no arrhythmia, no bicuspid aortic valve, no cancer, no congenital heart disease, no connective tissue disease, no CHF, no DVT, no mitral valve prolapse, no pacemaker, no PE, no PVD, no recent injury, no rheumatic fever, no sickle cell disease, no sleep apnea, no stimulant use, no thyroid problem and no valve disorder.   Her family medical history is significant for CAD in family, connective tissue disease in family, diabetes in family, heart disease in family, hyperlipidemia in family, hypertension in family, early MI in family and PVD in family.   Pertinent negatives for family medical history include: family history of aortic dissection, no PE in family and no stroke in family.   Procedure history is positive for cardiac catheterization.         Past Medical History   Diagnosis Date   . Asthma    . COPD (chronic obstructive pulmonary disease)    . Coronary artery disease    . Diabetes mellitus    . Hypertension    . Ischemic Stroke 03/06/2006     Created  by Conversion    . Prior Myocardial Infarction 03/19/2007     Created by Conversion    . Unspecified cerebral artery occlusion with cerebral infarction        Past Surgical History   Procedure Laterality Date   . Coronary angioplasty with stent placement         Family History   Problem Relation Age of Onset   . Diabetes Other    . Heart disease Other    . Hypertension Other    . Coronary art dis Other    . Hypertension Mother    . Diabetes Mother    . Alcohol abuse Father    . Other Father      cirrhosis   . Arthritis Sister    . Substance abuse Sister    . Other Brother      HIV   . Substance abuse Brother    . Diabetes Brother        Social History       reports that she quit smoking about a year ago. Her smoking use included Cigarettes. She smoked 0.00 packs per day. She has never used smokeless tobacco. She reports that she uses illicit drugs (Cocaine). She reports that she does not drink alcohol. His sexual activity history not on file.    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 Negative for fever.   HENT: Positive for congestion.         The patient does not have more than her usual chest congestion.   Eyes: Positive for visual disturbance.        The patient wears glasses.   Respiratory: Positive for shortness of breath and wheezing. Negative for cough.    Cardiovascular: Positive for chest pain, orthopnea and claudication. Negative for palpitations, leg swelling, syncope and near-syncope.   Gastrointestinal: Negative for nausea, vomiting and abdominal pain.   Endocrine: Positive for heat intolerance. Negative for cold intolerance.   Genitourinary: Negative for difficulty urinating.   Musculoskeletal: Positive for back pain.   Skin: Negative for rash.   Allergic/Immunologic: Negative for environmental allergies and food allergies.   Neurological: Positive for dizziness and seizures.   Hematological: Bruises/bleeds easily.   Psychiatric/Behavioral: The patient is nervous/anxious.        Physical Exam   BP 170/110  Pulse 84  Temp(Src) 35.8 C (96.4 F) (Temporal)  Resp 22  SpO2 97%    Physical Exam   Constitutional: She is oriented to person, place, and time.   The patient is an obese 56 yo woman in NAD.   HENT:   Head: Normocephalic and atraumatic.   Mouth/Throat: Oropharynx is clear and moist. No oropharyngeal exudate.   Eyes: Conjunctivae and EOM are  normal. Pupils are equal, round, and reactive to light. Right eye exhibits no discharge. Left eye exhibits no discharge. No scleral icterus.   Neck: Normal range of motion. Neck supple.   Cardiovascular: Normal rate, regular rhythm and normal heart sounds.  Exam reveals no gallop.    No murmur heard.  Pulmonary/Chest: Effort normal  and breath sounds normal. No respiratory distress. She has no wheezes. She has no rales. She exhibits tenderness.   The patient has pain on palpation of her left parasternal border which she states is the same pain that she came in with.   Abdominal: Soft. Bowel sounds are normal. She exhibits no distension. There is no tenderness.   Musculoskeletal: She exhibits no edema.   Neurological: She is alert and oriented to person, place, and time.   Skin: Skin is warm and dry. No rash noted.   Psychiatric: She has a normal mood and affect. Her behavior is normal. Thought content normal.       Tests    EKG: NSR @ 91, normal    Labs:   All labs in the last 24 hours   Recent Results (from the past 24 hour(s))   HOLD BLUE       Result Value Range    Hold Blue HOLD TUBE     TROPONIN T    Collection Time    09/19/11 10:24 PM       Result Value Range    Troponin T <0.01  0.00 - 0.02 ng/mL   CBC AND DIFFERENTIAL    Collection Time    09/19/11 10:24 PM       Result Value Range    WBC 8.4  4.0 - 10.0 THOU/uL    RBC 4.5  3.9 - 5.2 MIL/uL    Hemoglobin 11.4  11.2 - 15.7 g/dL    Hematocrit 37  34 - 45 %    MCV 81  79 - 95 fL    RDW 17.5 (*) 11.7 - 14.4 %    Platelets 302  160 - 370 THOU/uL    Seg Neut % 53.3  34.0 - 71.1 %    Lymphocyte % 38.7  19.3 - 51.7 %    Monocyte % 6.9  4.7 - 12.5 %    Eosinophil % 0.7  0.7 - 5.8 %    Basophil % 0.4  0.1 - 1.2 %    Neut # K/uL 4.5  1.6 - 6.1 THOU/uL    Lymph # K/uL 3.3  1.2 - 3.7 THOU/uL    Mono # K/uL 0.6  0.2 - 0.9 THOU/uL    Eos # K/uL 0.1  0.0 - 0.4 THOU/uL    Baso # K/uL 0.0  0.0 - 0.1 THOU/uL   BASIC METABOLIC PANEL    Collection Time    09/19/11 10:24 PM        Result Value Range    Glucose 43 (*) 60 - 99 mg/dL    Sodium 161  096 - 045 mmol/L    Potassium 3.8  3.3 - 5.1 mmol/L    Chloride 105  96 - 108 mmol/L    CO2 27  20 - 28 mmol/L    Anion Gap 10  7 - 16    UN 16  6 - 20 mg/dL    Creatinine 4.09 (*) 0.51 - 0.95 mg/dL    GFR,Caucasian 63      GFR,Black 73      Calcium 9.5  8.6 - 10.2 mg/dL   BLOOD BANK HOLD RED    Collection Time    09/19/11 10:24 PM       Result Value Range    Bld Bank Hld Red Red In Bld Bank     BLOOD BANK HOLD LAVENDER    Collection Time    09/19/11 10:24 PM  Result Value Range    Bld Bank Hld Lav Lav In Bld Bank     POCT GLUCOSE    Collection Time    09/19/11 10:35 PM       Result Value Range    Glucose POCT 47 (*) 60 - 99 mg/dL   TROPONIN T    Collection Time    09/20/11  5:31 AM       Result Value Range    Troponin T <0.01  0.00 - 0.02 ng/mL   TROPONIN T    Collection Time    09/20/11 11:23 AM       Result Value Range    Troponin T <0.01  0.00 - 0.02 ng/mL   NT-PRO BNP    Collection Time    09/20/11 11:23 AM       Result Value Range    NT-pro BNP <50  0 - 900 pg/mL   BLOOD CULTURE    Collection Time    09/20/11 11:23 AM       Result Value Range    Bacterial Blood Culture .     LACTATE, PLASMA    Collection Time    09/20/11 11:23 AM       Result Value Range    Lactate 1.3  0.5 - 2.2 mmol/L   BLOOD CULTURE    Collection Time    09/20/11 11:23 AM       Result Value Range    Bacterial Blood Culture .     POCT GLUCOSE    Collection Time    09/20/11  1:03 PM       Result Value Range    Glucose POCT 201 (*) 60 - 99 mg/dL        Imaging: *chest Standard Frontal And Lateral Views    09/20/2011  IMPRESSION:   No evidence of acute cardiopulmonary disease.   END REPORT The consultation was reviewed and approved by an attending radiologist after exam interpretation with a radiologist in training or PA.       Medical Decision Making      Amount and/or Complexity of Data Reviewed  Clinical lab tests: reviewed  Tests in the radiology section of CPT: reviewed  Discuss  the patient with other providers: yes (Dr. Lenn Sink.)  Independent visualization of images, tracings, or specimens: yes (ekg)        Assessment:  56 y.o. female placed in OBS after evaluation in the ED for chest pain.  The patient states that the pain on palpation of her left parasternal area is the same pain that she came in with.  The underlying problem seems to be a lack of food which caused her to be hypoglycemic which caused her to be anxious and then which caused the cp.    Differential Diagnosis includes ACS (ruled out), angina, musculoskeletal pain, dysphagia, hypoglycemia, COPD exacerbation          Plan:  The patient has been placed in obs.  Will check FLP in the am.  Dr. Lenn Sink to decide if the patient needs a stress test in the am.  Will continue her albuterol nebs and atrovent nebs.      Other medical problems:  #2 - asthma/COPD - continue albuterol and atrovent, budesonide - formoterol  #3 - CAD - s/p stent in 2008 - continue asa, plavix, lovastatin  #4 - DM - continue lantus and novolog  #5 - HTN - continue amlodipine, losartan  #6 - CVA - continue asa and plavix  #  7 - lack of food - SW to see the patient    Medically preferred DVT prophylaxis: None      Kalman Shan, MD

## 2011-09-20 NOTE — Consults (Addendum)
Cardiology Consult Service - H&P    Consult requested by: May, Joel L, Gloria Ray  Chief Complaint: dyspnea, chest discomfort    HPI:  56 y.o. female with a history of CAD, having sustained a prior inferior MI with stenting in 2008, and a reported repeat PCI in 2011 [facility]-records unavailable). Yesterday afternoon she noted her blood sugar to be low, in the 50-60 range, which made her anxious, after which she started noting dyspnea and some chest tightness. The chest tightness has come and gone in waves, but the dyspnea has continued. She notes some temporary relief with nebulizers for COPD. The symptoms are reminiscent of her prior cardiac presentations, but also of her more recent COPD episodes here at [facility]. There is no clear cardiac etiology, and the description of the dyspnea is consistent with an inability to take a deep breath or exhale readily. The chest discomfort changes spontaneously, and does not change with movement. She is currently hypertensive and awaiting her home medications.    Review of Systems:   A complete 12-point review of symptoms was completed and is negative except as documented in HPI. hypoglycemia episode with nervousness and GI distress.    Past Medical and Surgical History:  Past Medical History   Diagnosis Date   . Asthma    . COPD (chronic obstructive pulmonary disease)    . Coronary artery disease    . Diabetes mellitus    . Hypertension    . Ischemic Stroke 03/06/2006     Created by Conversion    . Prior Myocardial Infarction 03/19/2007     Created by Conversion      Past Surgical History   Procedure Laterality Date   . Coronary angioplasty with stent placement         Medications:  Patient's Medications   New Prescriptions    No medications on file   Previous Medications    ACETAMINOPHEN (TYLENOL) 500 MG TABLET    Take 2 tabs (1000mg ) up to three time daily as needed for pain. Max 6 tabs (3000mg ); decrease by 1 tab for every Lortab taken daily.    AEROCHAMBER Z-STAT PLUS/SMALL SPACER  DEVICE    Use as directed.    ALBUTEROL (PROVENTIL) (2.5 MG/3ML) 0.083% NEBULIZER SOLUTION    Take 3 mLs (2.5 mg total) by nebulization every 2 hours as needed for Wheezing or Shortness of Breath    ALBUTEROL-IPRATROPIUM (COMBIVENT) 18-103 MCG/ACT INHALER    Inhale 1-2 puffs into the lungs every 6 hours as needed for Wheezing   Shake well before each use.    AMLODIPINE (NORVASC) 10 MG TABLET    Take 1 tablet (10 mg total) by mouth daily    AMMONIUM LACTATE (AMLACTIN) 12 % CREAM        ASPIRIN 325 MG TABLET    Take 1 tablet (325 mg total) by mouth daily    BAYER CONTOUR NEXT TEST STRIP    Use 1 strip 3 times daily to test Blood Glucose level.  ICD9: 250.02 DM type2, uncontrolled    BLOOD GLUCOSE MONITOR (ONE TOUCH ULTRA 2) KIT    Use as instructed    BUDESONIDE-FORMOTEROL (SYMBICORT) 160-4.5 MCG/ACT INHALER    Inhale 2 puffs into the lungs 2 times daily    CLOPIDOGREL (PLAVIX) 75 MG TABLET    Take 1 tablet (75 mg total) by mouth daily    DEXTROSE (GLUTOSE) 40 % ORAL GEL    Take 15 g by mouth once as needed for Low  blood sugar    DOCUSATE SODIUM (COLACE) 100 MG CAPSULE    Take 1 capsule (100 mg total) by mouth 2 times daily    DULOXETINE (CYMBALTA) 60 MG CAPSULE    Take 1 capsule (60 mg total) by mouth daily    ERGOCALCIFEROL (DRISDOL) 50000 UNIT CAPSULE    Take 1 capsule (50,000 Units total) by mouth twice a week    FUROSEMIDE (LASIX) 20 MG TABLET    Take 1 tablet (20 mg total) by mouth every morning    HYDROCODONE-ACETAMINOPHEN (LORTAB) 10-500 MG PER TABLET    Take 1 tablet by mouth nightly as needed for Pain   MDD 1 tablets.    INSULIN GLARGINE (LANTUS SOLOSTAR) 100 UNIT/ML INJECTION PEN    Inject 80 Units into the skin nightly    INSULIN LISPRO (HUMALOG KWIKPEN) 100 UNIT/ML INJECTION PEN    Inject 30 Units into the skin 3 times daily (before meals)    INSULIN PEN NEEDLE (BD ULTRA-FINE PEN NEEDLE MINI) 31G X 5 MM    Use 3 times a day as instructed.    IPRATROPIUM-ALBUTEROL (DUONEB) 0.5-2.5 (3) MG/3ML NEBULIZER  SOLUTION    Take 3 mLs by nebulization 4 times daily    LANCETS    Brand Truetest; Use 3 times per day as directed for blood glucose testing.    LOSARTAN (COZAAR) 50 MG TABLET    Take 1 tablet (50 mg total) by mouth daily    LOVASTATIN (MEVACOR) 10 MG TABLET    Take 1 tablet (10 mg total) by mouth daily (with dinner)    MELOXICAM (MOBIC) 15 MG TABLET    Take 1 tablet (15 mg total) by mouth daily   Take with food.    MORPHINE (MS CONTIN) 15 MG 12 HR TABLET    Take 2 tablets (30 mg total) by mouth 2 times daily   MDD 60 mg    MUPIROCIN (BACTROBAN) 2 % OINTMENT        NEBULIZER DEVICE    As directed    NICOTINE (NICODERM CQ) 14 MG/24HR PATCH    Place 1 patch onto the skin daily   Remove & discard patch after 24 hours.    OMEPRAZOLE (PRILOSEC) 20 MG CAPSULE    TAKE 1 CAPSULE DAILY.    PENICILLIN V POTASSIUM (VEETIDS) 500 MG TABLET        STARCH (THICK-IT 2) POWDER PACKET    Add to liquid or pureed foods for thickening as needed.    TRAZODONE (DESYREL) 300 MG TABLET    Take 1 tablet (300 mg total) by mouth nightly   Modified Medications    No medications on file   Discontinued Medications    No medications on file   Scheduled Meds:   PRN Meds:.     Allergies:  Allergies   Allergen Reactions   . Keppra      Created by Conversion - 0;    . Lisinopril      Created by Conversion - Hyperkalemia;        Social History:  Tobacco: denies  Alcohol: denies  Drug Use: denies    Family History:  Family History   Problem Relation Age of Onset   . Diabetes Other    . Heart disease Other    . Hypertension Other    . Coronary art dis Other        Physical Exam:  No intake or output data in the 24 hours ending 09/20/11 0934  Filed Vitals:  09/19/11 2030 09/20/11 0150 09/20/11 0657 09/20/11 0917   BP: 144/98 144/91 170/89 174/96   Pulse: 83 78 86 87   Temp: 35.8 C (96.4 F) 36 C (96.8 F) 35.8 C (96.4 F) 36.3 C (97.3 F)   TempSrc:    Temporal   Resp: 16 16 14 16    SpO2: 98% 99% 98% 97%     General: NAD, lying at 30 degree angle,  speaking clearly, but dyspneic with moving around the bed.  HEENT: MMM, no O/P lesions  Neck: supple, JVP non-elevated  Cardiac: RRR, S1S2, no M/G/R  Lungs: poor air movement with mild end expiratory wheezes. No evidence of CHF  Abdomen: soft, nontender, nondistended, normal bowel sounds, morbidly obese  Extremities: no LEE, good distal pulses  Neuro: grossly intact    Labs:    Recent Labs  Lab 09/19/11  2224   WBC 8.4   Hemoglobin 11.4   Hematocrit 37   Platelets 302   Sodium 142   Potassium 3.8   Chloride 105   CO2 27   No components found with this basename: CKTOTAL, TROPONINT, CKMBINDEX, No results found for this basename: CHOL, HDL, LDL, TRIG,  in the last 168 hours    ECG: NSR, no ischemic changes    Radiology:   *chest Standard Frontal And Lateral Views    09/20/2011  IMPRESSION:   No evidence of acute cardiopulmonary disease.   END REPORT The consultation was reviewed and approved by an attending radiologist after exam interpretation with a radiologist in training or PA.       Cardiology Studies:  Prior to 2011. None recent      ASSESSMENT:  56 y.o. female with predominant dyspnea and some chest discomfort/tightness in the setting of anxiety over hypoglycemia. The patient appears dyspneic while moving, but calm when speaking. The tightness comes and goes she reports.    RECOMMENDATION:    1) The patient's symptoms are less likely to be cardiac in the setting of predominant dyspnea, a normal ECG, and normal troponins. Complete the rule out protocol, and treat the patient's COPD.    The patient needs to be back on her home regimen for BP control as well.    Maintain the patient on her medications.    We will consider a Dobutamine stress echocardiogram tomorrow or as an outpatient, depending on her progress with treatment of the bronchospasm.    2) Hypertension: Please restart home medications. We can address some potential changes once we know how she is on her current therapy.    3) Hyperlipidemia: A recheck  tomorrow morning of her fasting lipids would be helpful, since she is not on Lipitor, but on low dose Mevacor, as she was not well controlled on her last evaluation.    4) Diabetes: Poor glycemic control with highs and lows.    Might benefit from a consultation while in the Obs unit.    5) COPD: Restart her meds and treat for potential acute flare.    6) Tobacco avoidance discussed.    I have discussed the plan with the patient who acknowledges understanding and asks to proceed.    Gloria Hart, Gloria Ray    Addendum: The patient's most recent cardiovascular evaluation was a cardiac cath in 2011, performed at [facility].   This reported the following anatomy:     LMT normal     LAD 85% proximal stenosis with 30% mid.     LCx proximal 25% with a widely  patent stent in the OM1 branch.     RCA diffuse 70% proximal to mid stenosis with a Chronic Total occlusion of the mid vessel. There are faint L-R collaterals to the distal RCA.     LVEF 65%, LVEDP 23 mmHg       At the time, a 3.0/12 mm Vision (bare metal stent) was placed in the proximal LAD and deployed at 14 atm/16 atm postdilation.    Study date 09/09/09.    Gloria Hart, Gloria Ray

## 2011-09-20 NOTE — ED Notes (Signed)
Pt states she is having 8 out of 10 chest pain. Provider DP notified and aware. Offered heat packs and tylenol. Patient declined both initially. After notifying provider, patient states she "will try tylenol".

## 2011-09-20 NOTE — ED Notes (Signed)
Report called to OBS

## 2011-09-20 NOTE — ED Notes (Signed)
Resting quietly, awakes to voice. Will continue to monitor

## 2011-09-20 NOTE — ED Notes (Signed)
Pt arrived to obs unti via stretcher, AOx3, pt able to ambulate with no assist, pt appears SOB but denies SOB and is able to carry a conversation with no difficulties,lungs CTA diminished at base bilat, pt requesting food, pt has no active diet order at this time, Clinical research associate will notify covering provider and follow orders as they come, pt oriented to unit, call bell in reach.

## 2011-09-20 NOTE — ED Notes (Signed)
Pt denies chest pain, dizziness, nausea at this time. States she has shortness of breath at rest and with exertion. Denies need for nebulizer treatment. Call bell within reach. Will continue to monitor.

## 2011-09-20 NOTE — ED Notes (Signed)
Plan of Care     Monitor overnight, breathing treatments as needed, comfort measures as needed.

## 2011-09-20 NOTE — Progress Notes (Signed)
Utilization Management    Level of Care Observation service as of the date 09/20/2011      Gloris Shiroma, RN     Pager: [phone]

## 2011-09-20 NOTE — ED Notes (Signed)
ED RN INTERN ATTESTATION       I Elise Benne, RN (RN) reviewed the following charting information by the RN intern: Clyda Hurdle    Nursing Assessments  Medications  Plan of Care  Teaching   Notes    In the chart of Gloria Ray (56 y.o. female) and attest to the charting being accurate.

## 2011-09-21 LAB — CBC AND DIFFERENTIAL
Baso # K/uL: 0 10*3/uL (ref 0.0–0.1)
Basophil %: 0.2 % (ref 0.1–1.2)
Eos # K/uL: 0.1 10*3/uL (ref 0.0–0.4)
Eosinophil %: 1.1 % (ref 0.7–5.8)
Hematocrit: 36 % (ref 34–45)
Hemoglobin: 11.2 g/dL (ref 11.2–15.7)
Lymph # K/uL: 2.8 10*3/uL (ref 1.2–3.7)
Lymphocyte %: 45.2 % (ref 19.3–51.7)
MCV: 81 fL (ref 79–95)
Mono # K/uL: 0.5 10*3/uL (ref 0.2–0.9)
Monocyte %: 8.1 % (ref 4.7–12.5)
Neut # K/uL: 2.8 10*3/uL (ref 1.6–6.1)
Platelets: 321 10*3/uL (ref 160–370)
RBC: 4.4 MIL/uL (ref 3.9–5.2)
RDW: 17.5 % — ABNORMAL HIGH (ref 11.7–14.4)
Seg Neut %: 45.4 % (ref 34.0–71.1)
WBC: 6.2 10*3/uL (ref 4.0–10.0)

## 2011-09-21 LAB — LIPID PANEL
Chol/HDL Ratio: 4.5
Cholesterol: 189 mg/dL
HDL: 42 mg/dL
LDL Calculated: 111 mg/dL
Non HDL Cholesterol: 147 mg/dL
Triglycerides: 182 mg/dL — AB

## 2011-09-21 LAB — BASIC METABOLIC PANEL
Anion Gap: 10 (ref 7–16)
CO2: 27 mmol/L (ref 20–28)
Calcium: 9.6 mg/dL (ref 8.6–10.2)
Chloride: 101 mmol/L (ref 96–108)
Creatinine: 1.23 mg/dL — ABNORMAL HIGH (ref 0.51–0.95)
GFR,Black: 57 * — AB
GFR,Caucasian: 50 * — AB
Glucose: 278 mg/dL — ABNORMAL HIGH (ref 60–99)
Lab: 24 mg/dL — ABNORMAL HIGH (ref 6–20)
Potassium: 4 mmol/L (ref 3.3–5.1)
Sodium: 138 mmol/L (ref 133–145)

## 2011-09-21 LAB — EKG 12-LEAD
P: 52 degrees
QRS: -8 degrees
Rate: 91 {beats}/min
Severity: NORMAL
Severity: NORMAL
T: 58 degrees

## 2011-09-21 LAB — POCT GLUCOSE
Glucose POCT: 299 mg/dL — ABNORMAL HIGH (ref 60–99)
Glucose POCT: 256 mg/dL — ABNORMAL HIGH (ref 60–99)
Glucose POCT: 162 mg/dL — ABNORMAL HIGH (ref 60–99)
Glucose POCT: 132 mg/dL — ABNORMAL HIGH (ref 60–99)

## 2011-09-21 LAB — NT-PRO BNP: NT-pro BNP: <50 pg/mL (ref 0–900)

## 2011-09-21 MED ORDER — LORAZEPAM 1 MG PO TABS *I*
1.0000 mg | ORAL_TABLET | Freq: Four times a day (QID) | ORAL | Status: DC | PRN
Start: 2011-09-21 — End: 2011-09-22
  Administered 2011-09-21: 1 mg via ORAL
  Filled 2011-09-21: qty 1

## 2011-09-21 NOTE — Progress Notes (Signed)
Utilization Management    Level of Care Observation service as of the date 09/20/2011      Tallulah Hosman Lee Maresa Morash, RN     Pager: [phone]

## 2011-09-21 NOTE — ED Notes (Signed)
Pt complains of shortness of breath. Expiratory wheezes audible. Nebulizer treatment administered.

## 2011-09-21 NOTE — ED Notes (Signed)
Plan of Care   Breathing TX, monitor and tx bg.  Resting comfortably, will continue to monitor.

## 2011-09-21 NOTE — ED Notes (Signed)
Plan of Care   Reviewed with pt and includes medication administration per [facility], maintain comfort, nebs per order and PRN. Pt requesting to speak with SW, Sw called at this time and message left. OBS provider following.

## 2011-09-21 NOTE — Progress Notes (Addendum)
Contacts: Pt        Intervention: SWK provided pt with information on food cupboards and faxed a referral to [employer]'s SNAP program on her behalf.    Also please note: that per [facility] patient receives MOW's which are delivered to her home.    [address] [place], [place], [id number]

## 2011-09-21 NOTE — ED Obs Notes (Signed)
ED OBSERVATION PROGRESS NOTE    Patient seen by me today, 09/21/2011 at 12:35 PM    Current patient status: Observation    Chief Complaint:   Chief Complaint   Patient presents with   . Chest Pain       Subjective:  Patient is a 56 y.o. female presenting with chest pain. The history is provided by the patient. No language interpreter was used. Primary symptoms include fatigue, shortness of breath, and wheezing Pertinent negatives for primary symptoms include no fever, no syncope, no cough, no palpitations, no abdominal pain, no nausea and no vomiting. Chest discomfort with pinpoint tenderness left sternal border that increases with palpation. Pt reports that she does not ambulate a great deal at baseline.  Also reports that she is more short of breath today.      Nursing Pain Score:  Last Nursing documented pain:  0-10 Scale: 6 (09/21/11 0941)      Vitals: Patient Vitals for the past 24 hrs:   BP Temp Temp src Pulse Resp SpO2   09/21/11 0941 135/91 mmHg 35.8 C (96.4 F) TEMPORAL 87  18  97 %   09/21/11 0638 142/94 mmHg 36 C (96.8 F) TEMPORAL 85  22  95 %   09/21/11 0215 102/76 mmHg 36 C (96.8 F) TEMPORAL 77  20  97 %   09/20/11 2106 134/90 mmHg 36.2 C (97.2 F) - 85  22  92 %   09/20/11 1720 143/102 mmHg 35.8 C (96.4 F) - 76  18  94 %       Physical Examination:  Physical Exam   Constitutional: She is oriented to person, place, and time. She appears well-developed and well-nourished.   HENT:   Head: Normocephalic.   Neck: Normal range of motion. Neck supple.   Cardiovascular: Normal rate and regular rhythm.    Slightly dyspneic with talking.   Pulmonary/Chest: She has rales (fine rales in left base).   Slightly dyspnea with talking. Decreased BS in bases.    Abdominal: Soft.   Musculoskeletal: Normal range of motion.   Neurological: She is alert and oriented to person, place, and time.   Skin: Skin is warm and dry.       JWJ:XBJY    Lab Results:  All labs in the last 72 hours   Recent Results (from the past  72 hour(s))   HOLD BLUE       Result Value Range    Hold Blue HOLD TUBE     EKG 12-LEAD    Collection Time    09/19/11  9:12 PM       Result Value Range    Severity - NORMAL ECG -      Rate 91      P 52      QRS -8      T 58      Severity - NORMAL ECG -      Statement SINUS RHYTHM     TROPONIN T    Collection Time    09/19/11 10:24 PM       Result Value Range    Troponin T <0.01  0.00 - 0.02 ng/mL   CBC AND DIFFERENTIAL    Collection Time    09/19/11 10:24 PM       Result Value Range    WBC 8.4  4.0 - 10.0 THOU/uL    RBC 4.5  3.9 - 5.2 MIL/uL    Hemoglobin 11.4  11.2 - 15.7 g/dL  Hematocrit 37  34 - 45 %    MCV 81  79 - 95 fL    RDW 17.5 (*) 11.7 - 14.4 %    Platelets 302  160 - 370 THOU/uL    Seg Neut % 53.3  34.0 - 71.1 %    Lymphocyte % 38.7  19.3 - 51.7 %    Monocyte % 6.9  4.7 - 12.5 %    Eosinophil % 0.7  0.7 - 5.8 %    Basophil % 0.4  0.1 - 1.2 %    Neut # K/uL 4.5  1.6 - 6.1 THOU/uL    Lymph # K/uL 3.3  1.2 - 3.7 THOU/uL    Mono # K/uL 0.6  0.2 - 0.9 THOU/uL    Eos # K/uL 0.1  0.0 - 0.4 THOU/uL    Baso # K/uL 0.0  0.0 - 0.1 THOU/uL   BASIC METABOLIC PANEL    Collection Time    09/19/11 10:24 PM       Result Value Range    Glucose 43 (*) 60 - 99 mg/dL    Sodium 960  454 - 098 mmol/L    Potassium 3.8  3.3 - 5.1 mmol/L    Chloride 105  96 - 108 mmol/L    CO2 27  20 - 28 mmol/L    Anion Gap 10  7 - 16    UN 16  6 - 20 mg/dL    Creatinine 1.19 (*) 0.51 - 0.95 mg/dL    GFR,Caucasian 63      GFR,Black 73      Calcium 9.5  8.6 - 10.2 mg/dL   BLOOD BANK HOLD RED    Collection Time    09/19/11 10:24 PM       Result Value Range    Bld Bank Hld Red Red In Bld Bank     BLOOD BANK HOLD LAVENDER    Collection Time    09/19/11 10:24 PM       Result Value Range    Bld Bank Hld Lav Lav In Bld Bank     POCT GLUCOSE    Collection Time    09/19/11 10:35 PM       Result Value Range    Glucose POCT 47 (*) 60 - 99 mg/dL   TROPONIN T    Collection Time    09/20/11  5:31 AM       Result Value Range    Troponin T <0.01  0.00 - 0.02 ng/mL    TROPONIN T    Collection Time    09/20/11 11:23 AM       Result Value Range    Troponin T <0.01  0.00 - 0.02 ng/mL   NT-PRO BNP    Collection Time    09/20/11 11:23 AM       Result Value Range    NT-pro BNP <50  0 - 900 pg/mL   BLOOD CULTURE    Collection Time    09/20/11 11:23 AM       Result Value Range    Bacterial Blood Culture .     LACTATE, PLASMA    Collection Time    09/20/11 11:23 AM       Result Value Range    Lactate 1.3  0.5 - 2.2 mmol/L   BLOOD CULTURE    Collection Time    09/20/11 11:23 AM       Result Value Range    Bacterial Blood Culture .  POCT GLUCOSE    Collection Time    09/20/11  1:03 PM       Result Value Range    Glucose POCT 201 (*) 60 - 99 mg/dL   POCT GLUCOSE    Collection Time    09/20/11  8:02 PM       Result Value Range    Glucose POCT 152 (*) 60 - 99 mg/dL   POCT GLUCOSE    Collection Time    09/21/11  6:58 AM       Result Value Range    Glucose POCT 299 (*) 60 - 99 mg/dL   BASIC METABOLIC PANEL    Collection Time    09/21/11  7:25 AM       Result Value Range    Glucose 278 (*) 60 - 99 mg/dL    Sodium 161  096 - 045 mmol/L    Potassium 4.0  3.3 - 5.1 mmol/L    Chloride 101  96 - 108 mmol/L    CO2 27  20 - 28 mmol/L    Anion Gap 10  7 - 16    UN 24 (*) 6 - 20 mg/dL    Creatinine 4.09 (*) 0.51 - 0.95 mg/dL    GFR,Caucasian 50 (*)     GFR,Black 57 (*)     Calcium 9.6  8.6 - 10.2 mg/dL   LIPID PANEL    Collection Time    09/21/11  7:25 AM       Result Value Range    Cholesterol 189      Triglycerides 182 (*)     HDL 42      LDL Calculated 111      Non HDL Cholesterol 147      Chol/HDL Ratio 4.5     POCT GLUCOSE    Collection Time    09/21/11 12:12 PM       Result Value Range    Glucose POCT 256 (*) 60 - 99 mg/dL           Imaging findings: *chest Standard Frontal And Lateral Views    09/20/2011  Exam Site: [facility]  09/19/2011 11:15 PM CHEST FRONTAL AND LAT   ORDERING CLINICAL INFORMATION:  ERECORD: chest pain ADDITIONAL CLINICAL INFORMATION:  None.    COMPARISON:  None.   FINDINGS:   Tubes and Catheters: None.   Central Airways: Unremarkable.   Lungs: No focal airspace or interstitial lung disease.   Heart and Mediastinum: Cardiomediastinal silhouette is unremarkable.   Pleura/Pleural space: No pleural effusions or pneumothorax.   Bones and soft tissues: No acute osseous findings.       09/20/2011  IMPRESSION:   No evidence of acute cardiopulmonary disease.   END REPORT The consultation was reviewed and approved by an attending radiologist after exam interpretation with a radiologist in training or PA.       Assessment and Plan:   1. Shortness of breath/crackles in left base:CBC and BNP today, encouraged her to use albuterol as needed.  She becomes dyspnec with talking but resolves quickly. O2 sats have been stable. Continue to monitor.    Medically preferred DVT prophylaxis: None, ambulating to BR.  Supervising physician Dr. Drue Novel was immediately available.    Author: Asher Muir, NP  Note created: 09/21/2011  at: 12:30 PM

## 2011-09-21 NOTE — Progress Notes (Signed)
Pt is currently active with [facility] and receives CHN/HHA and MOW services at home. Plan will be for [facility] to follow up with pt day after dc. Will continue to follow. Floy Sabina RN [facility] for [facility] [phone]

## 2011-09-21 NOTE — Progress Notes (Signed)
Patient is active with [facility] home care agency services- notified [facility] agency rep Felipa Eth, RN- [facility] will follow for home care services needs.

## 2011-09-22 LAB — BASIC METABOLIC PANEL
Anion Gap: 11 (ref 7–16)
CO2: 26 mmol/L (ref 20–28)
Calcium: 9.4 mg/dL (ref 8.6–10.2)
Chloride: 104 mmol/L (ref 96–108)
Creatinine: 1.21 mg/dL — ABNORMAL HIGH (ref 0.51–0.95)
GFR,Black: 58 * — AB
GFR,Caucasian: 51 * — AB
Glucose: 118 mg/dL — ABNORMAL HIGH (ref 60–99)
Lab: 26 mg/dL — ABNORMAL HIGH (ref 6–20)
Potassium: 4.2 mmol/L (ref 3.3–5.1)
Sodium: 141 mmol/L (ref 133–145)

## 2011-09-22 LAB — CBC AND DIFFERENTIAL
Baso # K/uL: 0 10*3/uL (ref 0.0–0.1)
Basophil %: 0.3 % (ref 0.1–1.2)
Eos # K/uL: 0.1 10*3/uL (ref 0.0–0.4)
Eosinophil %: 1 % (ref 0.7–5.8)
Hematocrit: 36 % (ref 34–45)
Hemoglobin: 10.9 g/dL — ABNORMAL LOW (ref 11.2–15.7)
Lymph # K/uL: 2.8 10*3/uL (ref 1.2–3.7)
Lymphocyte %: 41.6 % (ref 19.3–51.7)
MCV: 82 fL (ref 79–95)
Mono # K/uL: 0.6 10*3/uL (ref 0.2–0.9)
Monocyte %: 8.3 % (ref 4.7–12.5)
Neut # K/uL: 3.3 10*3/uL (ref 1.6–6.1)
Platelets: 270 10*3/uL (ref 160–370)
RBC: 4.3 MIL/uL (ref 3.9–5.2)
RDW: 17.4 % — ABNORMAL HIGH (ref 11.7–14.4)
Seg Neut %: 48.8 % (ref 34.0–71.1)
WBC: 6.8 10*3/uL (ref 4.0–10.0)

## 2011-09-22 LAB — POCT GLUCOSE
Glucose POCT: 135 mg/dL — ABNORMAL HIGH (ref 60–99)
Glucose POCT: 139 mg/dL — ABNORMAL HIGH (ref 60–99)

## 2011-09-22 NOTE — ED Obs Notes (Signed)
ED OBSERVATION DISCHARGE NOTE    Patient seen by me today, 09/22/2011 at 11:25 AM.    Current patient status: Observation    Subjective: "My family is driving up from [place], can I stay here until tomorrow?     No numbness, no tingling, no weakness, no redness, no red streaking, no fever, no chills, no chest pain, no sob, no nausea, and no vomiting.       Observation Stay Includes:  56 y.o.female who presented to the ED with Chief Complaint   Patient presents with   . Chest Pain     56 y.o. F with PMH of COPD, CAD s/p stent, HTN, DM, CVA who presents with chest tightness, SOB and hypoglycemia. Patient placed in observation for further evaluation and testing. Patient evaluated by Dr Lenn Sink and he obtained her recent records from [place], recommends f/u with Dr. Candyce Churn at [facility].     Last Nursing documented pain:  0-10 Scale: 5 (09/22/11 1112)      Vitals:  Patient Vitals for the past 24 hrs:   BP Temp Temp src Pulse Resp SpO2   09/22/11 0936 125/84 mmHg 35.8 C (96.4 F) TEMPORAL 85  20  95 %   09/22/11 0723 134/88 mmHg 36 C (96.8 F) - 82  20  92 %   09/22/11 0217 113/76 mmHg 36 C (96.8 F) TEMPORAL 74  22  94 %   09/21/11 2144 127/78 mmHg 36 C (96.8 F) - 58  22  94 %   09/21/11 1638 164/90 mmHg 35.6 C (96.1 F) - 94  24  93 %   09/21/11 1328 154/104 mmHg 36.2 C (97.2 F) TEMPORAL 86  18  96 %         Physical Exam:  Physical Exam   Nursing note and vitals reviewed.  Constitutional: She is oriented to person, place, and time. She appears well-developed and well-nourished. No distress.   HENT:   Mouth/Throat: Oropharynx is clear and moist. No oropharyngeal exudate.   Eyes: Conjunctivae are normal. Right eye exhibits no discharge. Left eye exhibits no discharge. No scleral icterus.   Neck: Normal range of motion. Neck supple.   Cardiovascular: Normal rate, regular rhythm, normal heart sounds and intact distal pulses.    Pulmonary/Chest: Effort normal. No respiratory distress. She has wheezes.   Few  scattered wheezes noted.    Abdominal: Soft. Bowel sounds are normal. She exhibits no distension. There is no tenderness. There is no rebound.   Musculoskeletal: Normal range of motion. She exhibits no edema and no tenderness.   Neurological: She is alert and oriented to person, place, and time. Coordination normal.   Skin: Skin is warm and dry. No rash noted. She is not diaphoretic. No erythema. No pallor.   Psychiatric: She has a normal mood and affect. Her behavior is normal. Judgment and thought content normal.   Hard to understand, maybe d/t lack of teeth.        EKG: normal EKG, normal sinus rhythm, unchanged from previous tracings  Labs:  All labs in the last 72 hours   Imaging findings: *chest Standard Frontal And Lateral Views    09/20/2011  IMPRESSION:   No evidence of acute cardiopulmonary disease.   END REPORT The consultation was reviewed and approved by an attending radiologist after exam interpretation with a radiologist in training or PA.     Cardiac Testing:  Telemetry:   Consults: Cardiology    Assessment: chest pain/shortness of breath  Plan:  Chest Pain  -cardiology following.  -r/o with trops and EKGs  -f/u with cardiologist at [facility] as outpatient, Dr. Webb Laws.   -Stress testing as out patient  SOB  -nebs  Hypoglycemia  -working with SW  -monitor BG  dispo  -home. Follow up with pcp.  Disposition: Home  Follow-up:  within the next 2-5 days.  with  Your pcp and cardiologist  Smoking Cessation: NA    Diagnoses that have been ruled out:   None   Diagnoses that are still under consideration:   None   Final diagnoses:   Chest pain   Hypoglycemia, unspecified   .Supervising physician dr Drue Novel was immediately available.    Author: Wannetta Sender, NP  Note created: 09/22/2011  at: 11:25 AM

## 2011-09-22 NOTE — ED Notes (Signed)
Plan of Care     Nebulizer treatments PRN, comfort measures, possible D/C am

## 2011-09-22 NOTE — Discharge Instructions (Signed)
Patient is to drink fluids--6-8 glasses of water per day,regular diet as tolerated, activity as tolerated. Patient is to follow up with her doctor in the next one to two days.     Patient is to return for fever, chills, nausea, vomiting, worsening shortness of breath, abdominal pain, or any other concerns.     Patient verbalized understanding of discharge instructions and was discharged home in appropriate clothing.     Continue albuterol, atrovent and budesonide.     Monitor your blood sugar before meals and at bedtime.

## 2011-09-25 ENCOUNTER — Encounter: Payer: Self-pay | Admitting: Family Medicine

## 2011-09-25 ENCOUNTER — Ambulatory Visit: Payer: Self-pay | Admitting: Family Medicine

## 2011-09-25 VITALS — BP 158/80 | HR 94 | Temp 98.4°F | Wt 222.0 lb

## 2011-09-25 DIAGNOSIS — M25561 Pain in right knee: Secondary | ICD-10-CM

## 2011-09-25 DIAGNOSIS — J449 Chronic obstructive pulmonary disease, unspecified: Secondary | ICD-10-CM

## 2011-09-25 DIAGNOSIS — M25562 Pain in left knee: Secondary | ICD-10-CM

## 2011-09-25 DIAGNOSIS — E162 Hypoglycemia, unspecified: Secondary | ICD-10-CM

## 2011-09-25 DIAGNOSIS — T17998A Other foreign object in respiratory tract, part unspecified causing other injury, initial encounter: Secondary | ICD-10-CM

## 2011-09-25 MED ORDER — HYDROCODONE-ACETAMINOPHEN 10-500 MG PO TABS *A*
1.0000 | ORAL_TABLET | Freq: Three times a day (TID) | ORAL | Status: DC | PRN
Start: 2011-09-25 — End: 2011-10-09

## 2011-09-25 MED ORDER — GLUCOSE 40 % PO GEL *I*
15.0000 g | Freq: Once | ORAL | Status: DC | PRN
Start: 2011-09-25 — End: 2011-12-28

## 2011-09-25 NOTE — Progress Notes (Signed)
Assessment & Plan        Gloria Ray is a 56 y.o. female and  has a past medical history of Asthma; COPD (chronic obstructive pulmonary disease); Coronary artery disease; Diabetes mellitus; Hypertension; Ischemic Stroke (03/06/2006); Prior Myocardial Infarction (03/19/2007); and Unspecified cerebral artery occlusion with cerebral infarction.       The patient evaluated at clinic today for: Knee Pain and Shortness of Breath      DJD (degenerative joint disease) of knee, bilateral      DJD with X-ray evidence of mild-moderate tricompartmental arthritic change   F/u 1-2 weeks for procedure visit, steroid injection for knees   Refer to Ortho for candidacy for knee replacements   Short course of hydrocodone-acetaminophen to allow patient to perform ADLs   Application to insurance for housekeeping support   Continue [facility] for assistance, assessment    Chronic Obstructive Pulmonary Disease      Dysphagia in the setting of COPD, concern for exacerbation or aspiration pneumonia.   High risk for hospitalization.   Resubmit prior authorization for Thick-it   Currently receiving [facility], but may benefit from extra surveillance from Care Manager due to multiple risks for hospitalization (DM, COPD, CAD)    Hypoglycemia, unspecified      Recurrent due to poor adherence to diabetic diet requiring high levels of insulin, but large variation in diet due to food/finance.  Showing improved self efficacy, but education and cognitive barriers remain.  Does better with support.   Continued need for [facility], supports services   Refill rescue glucose tubes for hypoglycemia       Subjective        Chief Complaint: Knee Pain and Shortness of Breath    HPI     DJD (degenerative joint disease) of knee, bilateral       Patient reports fiance Gloria Ray is now in [place] receiving substance abuse treatment for relapse of crack cocaine addiction.  She has no one to help her around the house and is currently unable to perform ADLs due to knee pain.   Ability somewhat improved with previous hydrocodone trial, but limited quantity.  Desires injection, further evaluation for candidacy for knee replacement.  Encouraged by family to address issue as others in family have not addressed DJD and have become disabled.  Desires to maintain independent living.     Chronic Obstructive Pulmonary Disease       Patient reports that she has not received insurance approval for starch thickening agent for liquids.  Reports that she aspirates liquids frequently.=  Hypoglycemia, unspecified       Recently went to ED, reported chest pain but underlying issue was that she had no support at home and was running out of food.  Experiences hypoglycemia a the end of some months when runs out of food.  Requesting renewal of glucose rescue med.  Contacted [employer], and they were able to help with food.   ROS     Pertinent positives and negatives of a focused review of systems noted above.       Objective   Recorded Vital Signs:  BP 158/80  Pulse 94  Temp(Src) 36.9 C (98.4 F) (Temporal)  Wt 100.699 kg (222 lb)  BMI 38.09 kg/m2  SpO2 98%   Physical Exam   VITALS: Reviewed above and hypertensive.  BMI category is moderate obesity  GEN: Well developed, well nourished, appears older than stated age, no acute distress  HEENT: Atraumatic.  EOM normal, conjunctiva clear.  Moist mucus membranes, no oropharyngeal lesions.  Trachea midline, no lymphadenopathy or thyromegaly.  PULM: Lungs are clear to auscultation bilaterally with moderate effort, baseline expanded lung fields with good air movement in upper fields.  No crackles, rales, or wheezes appreciated.  CV: Regular rate, S1, S2.  No S3, S4, murmurs or rubs.  No JVD, no carotid bruit..  EXT: Warm and well perfused.  No edema.  Radial and dorsalis pedis pulses palpable.  SKIN: Warm and dry. No rashes, ecchymoses, or petechiae.  NEURO:  Alert.  Cranial nerves grossly intact.  Strength is normal and symmetric.  Gait is normal.   Balance is normal.     Labs  Results for orders placed during the hospital encounter of 09/19/11   TROPONIN T       Result Value Range    Troponin T <0.01  0.00 - 0.02 ng/mL   CBC AND DIFFERENTIAL       Result Value Range    WBC 8.4  4.0 - 10.0 THOU/uL    RBC 4.5  3.9 - 5.2 MIL/uL    Hemoglobin 11.4  11.2 - 15.7 g/dL    Hematocrit 37  34 - 45 %    MCV 81  79 - 95 fL    RDW 17.5 (*) 11.7 - 14.4 %    Platelets 302  160 - 370 THOU/uL    Seg Neut % 53.3  34.0 - 71.1 %    Lymphocyte % 38.7  19.3 - 51.7 %    Monocyte % 6.9  4.7 - 12.5 %    Eosinophil % 0.7  0.7 - 5.8 %    Basophil % 0.4  0.1 - 1.2 %    Neut # K/uL 4.5  1.6 - 6.1 THOU/uL    Lymph # K/uL 3.3  1.2 - 3.7 THOU/uL    Mono # K/uL 0.6  0.2 - 0.9 THOU/uL    Eos # K/uL 0.1  0.0 - 0.4 THOU/uL    Baso # K/uL 0.0  0.0 - 0.1 THOU/uL   BASIC METABOLIC PANEL       Result Value Range    Glucose 43 (*) 60 - 99 mg/dL    Sodium 161  096 - 045 mmol/L    Potassium 3.8  3.3 - 5.1 mmol/L    Chloride 105  96 - 108 mmol/L    CO2 27  20 - 28 mmol/L    Anion Gap 10  7 - 16    UN 16  6 - 20 mg/dL    Creatinine 4.09 (*) 0.51 - 0.95 mg/dL    GFR,Caucasian 63      GFR,Black 73      Calcium 9.5  8.6 - 10.2 mg/dL    Glucose POCT 47 (*) 60 - 99 mg/dL   TROPONIN T       Result Value Range    Troponin T <0.01  0.00 - 0.02 ng/mL   TROPONIN T       Result Value Range    Troponin T <0.01  0.00 - 0.02 ng/mL   NT-PRO BNP       Result Value Range    NT-pro BNP <50  0 - 900 pg/mL   BLOOD CULTURE       Result Value Range    Bacterial Blood Culture .     LACTATE, PLASMA       Result Value Range    Lactate 1.3  0.5 - 2.2 mmol/L   BLOOD  CULTURE       Result Value Range    Bacterial Blood Culture .     POCT GLUCOSE       Result Value Range    Glucose POCT 201 (*) 60 - 99 mg/dL   POCT GLUCOSE       Result Value Range    Glucose POCT 152 (*) 60 - 99 mg/dL   BASIC METABOLIC PANEL       Result Value Range    Glucose 278 (*) 60 - 99 mg/dL    Sodium 161  096 - 045 mmol/L    Potassium 4.0  3.3 - 5.1 mmol/L     Chloride 101  96 - 108 mmol/L    CO2 27  20 - 28 mmol/L    Anion Gap 10  7 - 16    UN 24 (*) 6 - 20 mg/dL    Creatinine 4.09 (*) 0.51 - 0.95 mg/dL    GFR,Caucasian 50 (*)     GFR,Black 57 (*)     Calcium 9.6  8.6 - 10.2 mg/dL   LIPID PANEL       Result Value Range    Cholesterol 189      Triglycerides 182 (*)     HDL 42      LDL Calculated 111      Non HDL Cholesterol 147      Chol/HDL Ratio 4.5     POCT GLUCOSE       Result Value Range    Glucose POCT 299 (*) 60 - 99 mg/dL   POCT GLUCOSE       Result Value Range    Glucose POCT 256 (*) 60 - 99 mg/dL   NT-PRO BNP       Result Value Range    NT-pro BNP <50  0 - 900 pg/mL       Result Value Range    Glucose POCT 162 (*) 60 - 99 mg/dL   POCT GLUCOSE       Result Value Range    Glucose POCT 132 (*) 60 - 99 mg/dL   POCT GLUCOSE       Result Value Range    Glucose POCT 135 (*) 60 - 99 mg/dL   POCT GLUCOSE       Result Value Range    Glucose POCT 139 (*) 60 - 99 mg/dL      Imaging  10/14/9145 CXR    Impression: No evidence of acute cardiopulmonary disease.       Note in APSO format, see top for Assessment & Plan, summarized below:    DJD (degenerative joint disease) of knee, bilateral      DJD with X-ray evidence of mild-moderate tricompartmental arthritic change   F/u 1-2 weeks for procedure visit, steroid injection for knees   Refer to Ortho for candidacy for knee replacements   Short course of hydrocodone-acetaminophen to allow patient to perform ADLs   Application to insurance for housekeeping support   Continue [facility] for assistance, assessment  Chronic Obstructive Pulmonary Disease      Dysphagia in the setting of COPD, concern for exacerbation or aspiration pneumonia.   High risk for hospitalization.   Resubmit prior authorization for Thick-it   Currently receiving [facility], but may benefit from extra surveillance from Care Manager due to multiple risks for hospitalization (DM, COPD, CAD)  Hypoglycemia, unspecified      Recurrent due to poor adherence to diabetic diet  requiring high levels of insulin, but  large variation in diet due to food/finance.  Showing improved self efficacy, but education and cognitive barriers remain.  Does better with support.   Continued need for [facility], supports services   Refill rescue glucose tubes for hypoglycemia     [name] MD, PhD   [facility]   09/25/2011  10:39 AM     --------------------    Preceptor Attestation - [facility]     I have reviewed the history, exam findings, and plan of care and discussed the care plan with the resident at the time of the visit. I agree with the resident's findings and plan of care as documented above.      Dimitri Ped, MD

## 2011-09-26 ENCOUNTER — Encounter: Payer: Self-pay | Admitting: Family Medicine

## 2011-09-26 ENCOUNTER — Encounter: Payer: Self-pay | Admitting: Gastroenterology

## 2011-09-26 DIAGNOSIS — T17998A Other foreign object in respiratory tract, part unspecified causing other injury, initial encounter: Secondary | ICD-10-CM | POA: Insufficient documentation

## 2011-09-26 LAB — BLOOD CULTURE
Bacterial Blood Culture: 0
Bacterial Blood Culture: 0

## 2011-09-26 MED ORDER — STARCH (THICKENING) PO PACK *A*
PACK | ORAL | Status: DC
Start: 2011-09-26 — End: 2013-07-15

## 2011-09-27 ENCOUNTER — Ambulatory Visit: Payer: Self-pay

## 2011-09-27 LAB — DRUG SCREEN PANEL, EMERGENCY
Amphetamine,UR: NEGATIVE
Barbiturate,UR: NEGATIVE
Benzodiazepinen,UR: NEGATIVE
Cocaine/Metab,UR: NEGATIVE
Opiates,UR: POSITIVE
Propoxyphene,UR: NEGATIVE
THC Metabolite,UR: NEGATIVE

## 2011-09-28 ENCOUNTER — Encounter: Payer: Self-pay | Admitting: Family Medicine

## 2011-09-28 LAB — CONFIRM OPIATES: Confirm Opiates: POSITIVE

## 2011-09-29 ENCOUNTER — Telehealth: Payer: Self-pay

## 2011-09-29 NOTE — Telephone Encounter (Signed)
[facility] has re certified their care of Patient for another 60 days

## 2011-09-30 ENCOUNTER — Ambulatory Visit: Payer: Self-pay | Admitting: Family Medicine

## 2011-10-02 ENCOUNTER — Telehealth: Payer: Self-pay | Admitting: Primary Care

## 2011-10-02 NOTE — Assessment & Plan Note (Signed)
S/O:       Patient reports fiance [name] is now in [place] receiving substance abuse treatment for relapse of crack cocaine addiction.  She has no one to help her around the house and is currently unable to perform ADLs due to knee pain.  Ability somewhat improved with previous hydrocodone trial, but limited quantity.  Desires injection, further evaluation for candidacy for knee replacement.  Encouraged by family to address issue as others in family have not addressed DJD and have become disabled.  Desires to maintain independent living.     A/P:      DJD with X-ray evidence of mild-moderate tricompartmental arthritic change   F/u 1-2 weeks for procedure visit, steroid injection for knees   Refer to Ortho for candidacy for knee replacements   Short course of hydrocodone-acetaminophen to allow patient to perform ADLs   Application to insurance for housekeeping support   Continue [facility] for assistance, assessment

## 2011-10-02 NOTE — Telephone Encounter (Signed)
Hello,    Patient called and requesting you to do a letter for her ins ([employer]) re: in-home service. She can be reached at [phone]    Thanks,

## 2011-10-04 NOTE — Assessment & Plan Note (Signed)
S/O:       Recently went to ED, reported chest pain but underlying issue was that she had no support at home and was running out of food.  Experiences hypoglycemia a the end of some months when runs out of food.  Requesting renewal of glucose rescue med.  Contacted [employer], and they were able to help with food.  A/P:      Recurrent due to poor adherence to diabetic diet requiring high levels of insulin, but large variation in diet due to food/finance.  Showing improved self efficacy, but education and cognitive barriers remain.  Does better with support.   Continued need for [facility], supports services   Refill rescue glucose tubes for hypoglycemia

## 2011-10-04 NOTE — Assessment & Plan Note (Signed)
S/O:       Patient reports that she has not received insurance approval for starch thickening agent for liquids.  Reports that she aspirates liquids frequently.  A/P:      Dysphagia in the setting of COPD, concern for exacerbation or aspiration pneumonia.   High risk for hospitalization.   Resubmit prior authorization for Thick-it   Currently receiving [facility], but may benefit from extra surveillance from Care Manager due to multiple risks for hospitalization (DM, COPD, CAD)

## 2011-10-05 ENCOUNTER — Telehealth: Payer: Self-pay | Admitting: Licensed Practical Nurse

## 2011-10-05 ENCOUNTER — Encounter: Payer: Self-pay | Admitting: Gastroenterology

## 2011-10-05 NOTE — Telephone Encounter (Signed)
Writer spoke with [facility] who confirmed that pt is continuing to receive skilled nursing services through [facility]; they are working on medication management. Pt also receives home health aide services 3 times per week and gets meals on wheels daily. CHN stated pt has a significant other who is able to help her "some" and in [facility] opinion, pt is "doing pretty goodAir cabin crew gave [facility] my direct contact information to better facilitate communication r/t this and any other mutual pts.

## 2011-10-09 ENCOUNTER — Ambulatory Visit: Payer: Self-pay | Admitting: Family Medicine

## 2011-10-09 ENCOUNTER — Encounter: Payer: Self-pay | Admitting: Family Medicine

## 2011-10-09 VITALS — BP 136/94 | HR 102 | Ht 64.17 in | Wt 222.0 lb

## 2011-10-09 DIAGNOSIS — M1710 Unilateral primary osteoarthritis, unspecified knee: Secondary | ICD-10-CM

## 2011-10-09 DIAGNOSIS — M25562 Pain in left knee: Secondary | ICD-10-CM

## 2011-10-09 DIAGNOSIS — M25561 Pain in right knee: Secondary | ICD-10-CM

## 2011-10-09 MED ORDER — MORPHINE SULFATE ER 15 MG PO TBCR *I*
30.0000 mg | ORAL_TABLET | Freq: Two times a day (BID) | ORAL | Status: DC
Start: 2011-10-09 — End: 2011-10-23

## 2011-10-09 NOTE — Progress Notes (Signed)
Assessment & Plan        Gloria Ray is a 56 y.o. female and  has a past medical history of Asthma; COPD (chronic obstructive pulmonary disease); Coronary artery disease; Diabetes mellitus; Hypertension; Ischemic Stroke (03/06/2006); Prior Myocardial Infarction (03/19/2007); and Unspecified cerebral artery occlusion with cerebral infarction.       The patient evaluated at clinic today for: Joint Swelling     DJD (degenerative joint disease) of knee, bilateral   L knee steroid injection, see procedure note   F/u 1-2 weeks for R knee steroid injection   Monitor BGs and consider need for increased insulin dosing if elevated   Refill long acting morphine   Anticipating Ortho referral, eval for knee replacement       Subjective        Chief Complaint: Joint Swelling    HPI     DJD (degenerative joint disease) of knee, bilateral       Patient presents for steroid injection of knee for radiologically documented OA.  L knee hurts most, but pain bilaterally.   ROS     Pertinent positives and negatives of a focused review of systems noted above.       Objective   Recorded Vital Signs:  BP 136/94  Pulse 102  Ht 1.63 m (5' 4.17")  Wt 100.699 kg (222 lb)  BMI 37.9 kg/m2   Physical Exam   VITALS: Reviewed above and mild tachycardia.  BMI category is moderate obesity.  GEN: Well developed, well nourished, appears stated age, no acute distress.  EXT: Warm and well perfused.   Labs  Results for orders placed in visit on 09/25/11   DRUG SCREEN PANEL, EMERGENCY       Result Value Range    Drug Remark,UR see text      Amphetamine,UR NEG      Barbiturate,UR NEG      Cocaine/Metab,UR NEG      Benzodiazepinen,UR NEG      Opiates,UR POS      Propoxyphene,UR NEG      THC Metabolite,UR NEG      Add'l Drugs,UR See Text()     CONFIRM OPIATES       Result Value Range    Confirm Opiates POS          Note in APSO format, see top for Assessment & Plan.    [name] MD, PhD   R2 [facility]   10/09/2011  10:53 AM      Preceptor Attestation - Procedure    I was present and I participated during the critical and key portions of this procedure, and I was immediately available during the remainder of the procedure.    Quentin Angst, MD

## 2011-10-09 NOTE — Procedures (Addendum)
Procedure note: The left knee was prepped in the standard fashion with alcohol.  Through an anteromedia portal the knee was injected with  4 ml of 1% Lidocaine and 1 ml (40mg ) of Kenalog.  The procedure was well tolerated.  The patient had good response to the Marcaine component.  The patient will follow up in 1-2 weeks.  Pain in L knee was 8/10 prior to procedure, 4/10 after.    [name] MD, PhD  Family Medicine, R2  10/09/2011 11:01 AM

## 2011-10-10 ENCOUNTER — Encounter: Payer: Self-pay | Admitting: Gastroenterology

## 2011-10-13 ENCOUNTER — Other Ambulatory Visit: Payer: Self-pay | Admitting: Family Medicine

## 2011-10-13 DIAGNOSIS — E119 Type 2 diabetes mellitus without complications: Secondary | ICD-10-CM

## 2011-10-13 MED ORDER — INSULIN PEN NEEDLE 31G X 5 MM MISC *A*
Status: DC
Start: 2011-10-13 — End: 2011-10-17

## 2011-10-17 ENCOUNTER — Other Ambulatory Visit: Payer: Self-pay | Admitting: Family Medicine

## 2011-10-17 ENCOUNTER — Telehealth: Payer: Self-pay | Admitting: Primary Care

## 2011-10-17 DIAGNOSIS — E119 Type 2 diabetes mellitus without complications: Secondary | ICD-10-CM

## 2011-10-17 DIAGNOSIS — M1710 Unilateral primary osteoarthritis, unspecified knee: Secondary | ICD-10-CM

## 2011-10-17 MED ORDER — INSULIN PEN NEEDLE 31G X 5 MM MISC *A*
Status: DC
Start: 2011-10-17 — End: 2012-07-17

## 2011-10-17 NOTE — Telephone Encounter (Signed)
Hello,    Patient said you need to call [phone], she is not sure of the lady name. She confused me and I think calling this number would help understand exactly what is needed.    Thanks,

## 2011-10-18 ENCOUNTER — Telehealth: Payer: Self-pay

## 2011-10-18 NOTE — Telephone Encounter (Signed)
Pt states that [employer] has told her in order to get help with her daily ADL's she needs an order from her PCP, please.

## 2011-10-18 NOTE — Telephone Encounter (Signed)
Faxed info to [employer].

## 2011-10-20 ENCOUNTER — Encounter: Payer: Self-pay | Admitting: Family Medicine

## 2011-10-20 NOTE — Assessment & Plan Note (Addendum)
S/O:       Patient presents for steroid injection of knee for radiologically documented OA.  L knee hurts most, but pain bilaterally.  A/P:   L knee steroid injection, see procedure note   F/u 1-2 weeks for R knee steroid injection   Monitor BGs and consider need for increased insulin dosing if elevated   Refill long acting morphine   Anticipating Ortho referral, eval for knee replacement

## 2011-10-23 ENCOUNTER — Encounter: Payer: Self-pay | Admitting: Family Medicine

## 2011-10-23 ENCOUNTER — Ambulatory Visit: Payer: Self-pay | Admitting: Family Medicine

## 2011-10-23 VITALS — BP 164/79 | HR 100 | Ht 64.17 in | Wt 225.0 lb

## 2011-10-23 DIAGNOSIS — M1710 Unilateral primary osteoarthritis, unspecified knee: Secondary | ICD-10-CM

## 2011-10-23 DIAGNOSIS — M25561 Pain in right knee: Secondary | ICD-10-CM

## 2011-10-23 DIAGNOSIS — M25562 Pain in left knee: Secondary | ICD-10-CM

## 2011-10-23 MED ORDER — MORPHINE SULFATE ER 15 MG PO TBCR *I*
45.0000 mg | ORAL_TABLET | Freq: Two times a day (BID) | ORAL | Status: DC
Start: 2011-10-23 — End: 2011-10-23

## 2011-10-23 MED ORDER — MORPHINE SULFATE 15 MG PO TABS *I*
30.0000 mg | ORAL_TABLET | ORAL | Status: DC | PRN
Start: 2011-10-23 — End: 2011-11-08

## 2011-10-23 NOTE — Progress Notes (Signed)
Assessment & Plan        Gloria Ray is a 56 y.o. female and  has a past medical history of Asthma; COPD (chronic obstructive pulmonary disease); Coronary artery disease; Diabetes mellitus; Hypertension; Ischemic Stroke (03/06/2006); Prior Myocardial Infarction (03/19/2007); and Unspecified cerebral artery occlusion with cerebral infarction.       The patient evaluated at clinic today for: Knee Pain      DJD (degenerative joint disease) of knee, bilateral      Documented pathology, documented functional impairment.  Not candidate for NSAIDs. Moderate risk for diversion, fiance currently in rehab for crack cocaine.   Referral to Orthopedic Surgery   Short course of long acting morphine, lower risk for diversion.  Due to insurance non-approval, substitute with short acting morphine, refill long acting if necessary at next visit.   No knee injection today    Plan/Orders/referrals:  Knee pain, bilateral  - Discontinue: morphine (MS CONTIN) 15 MG 12 hr tablet; Take 3 tablets (45 mg total) by mouth 2 times daily   MDD 60 mg  - AMB REFERRAL TO ORTHOPEDIC SURGERY  - Discontinue: morphine (MS CONTIN) 15 MG 12 hr tablet; Take 3 tablets (45 mg total) by mouth 2 times daily   MDD 90 mg  - morphine 15 MG immediate release tablet; Take 2 tablets (30 mg total) by mouth Q4-6H PRN for Pain   MDD 90 mg       Subjective        Chief Complaint: Knee Pain    HPI     DJD (degenerative joint disease) of knee, bilateral       Patient reports little to no improvement from L knee injection, does not want R knee done.  Reports that daughter gave her on of her Percocets, requesting this medication.  Discussed that this was inappropriate use of medication, high street value of Percocet and Vicodin.  Patient requesting alternative, kidney function precludes NSAIDs.        Pertinent positives and negatives of a focused review of systems noted above.       Objective   Recorded Vital Signs:  BP 164/79  Pulse 100  Ht 1.63 m (5' 4.17")  Wt  102.059 kg (225 lb)  BMI 38.41 kg/m2   Physical Exam   VITALS: Reviewed above and hypertensive.  BMI category is moderate obesity  GEN: Well developed, well nourished, appears stated age, no acute distress  HEENT: Atraumatic.  EOM normal, conjunctiva clear.  EXT: Warm and well perfused.  No edema.  SKIN: Warm and dry. No rashes, ecchymoses, or petechiae of exposed surfaces.  NEURO:  Alert.  Cranial nerves grossly intact.  Strength is normal and symmetric.  Reflexes within range of normal and symmetric. Gait is impaired.  Balance is normal.       Note in APSO format, see top for Assessment & Plan.     [name] MD, PhD   R2 [facility]   10/23/2011  12:01 PM     --------------------    Preceptor Attestation - [facility] Medicine     I have reviewed the history, exam findings, and plan of care and discussed the care plan with the resident at the time of the visit. I agree with the resident's findings and plan of care as documented above.  Agree with Ortho consult for definitive treatment, may have to work on pain management pre-op    Carren Rang, MD

## 2011-10-25 ENCOUNTER — Telehealth: Payer: Self-pay

## 2011-10-25 NOTE — Telephone Encounter (Signed)
VNS calling to verify pt dosages of Humalog and Lantus and D/C of Cymbalta and pt has a new script for Bupropion 100mg . Biggest concern for VNS is the fact that she found an empty bottle of Morphine 15mg  2tabs Q 4hrs PRN pain MDD: 6 that was prescribed on 10/23/11. The fact that there were no pills in the bottle concerned her that something else is being done with them. This Clinical research associate can confirm dosing of Humalog as 30units TID before meals and Lantus 80 units Q HS, and that the Cymbalta is not a current medication, but I see no current script in our system for Bupropion. Please advise [id number] if pt should/should not be taking Bupropion, and any concerns about morphine. Thank You!!!    Spoke with VNS who states that she will be D/Cing her from care as pt is non-compliant with meds. Pt states she takes 55units lantus at HS. Pt was given a freestyle insulx BG meter and may need new scripts for test strips and lancets. In discussing Morphine further VNS states pt told her she sold her morphine to pay her phonebill. (for $40), but that she "saved a few back". VNS instructed pt to bring all her meds to her next appt 8/28 with Suzette Battiest, MD for consolidation and pt should probably be recommended for blister packs for her meds.

## 2011-10-27 ENCOUNTER — Ambulatory Visit: Payer: Self-pay | Admitting: Sports Medicine

## 2011-10-30 NOTE — Assessment & Plan Note (Addendum)
S/O:       Patient reports little to no improvement from L knee injection, does not want R knee done.  Reports that daughter gave her on of her Percocets, requesting this medication.  Discussed that this was inappropriate use of medication, high street value of Percocet and Vicodin.  Patient requesting alternative, kidney function precludes NSAIDs.    A/P:      Documented pathology, documented functional impairment.  Not candidate for NSAIDs. Moderate risk for diversion, fiance currently in rehab for crack cocaine.   Referral to Orthopedic Surgery   Short course of long acting morphine, lower risk for diversion.  Due to insurance non-approval, substitute with short acting morphine, refill long acting if necessary at next visit.   No knee injection today

## 2011-10-31 ENCOUNTER — Telehealth: Payer: Self-pay

## 2011-10-31 NOTE — Telephone Encounter (Signed)
Patient calling in asking to have test strips and lancets for her new Free Style monitor.

## 2011-11-01 ENCOUNTER — Ambulatory Visit: Payer: Self-pay | Admitting: Sports Medicine

## 2011-11-01 ENCOUNTER — Other Ambulatory Visit: Payer: Self-pay | Admitting: Family Medicine

## 2011-11-01 ENCOUNTER — Other Ambulatory Visit: Payer: Self-pay | Admitting: Primary Care

## 2011-11-01 ENCOUNTER — Encounter: Payer: Self-pay | Admitting: Sports Medicine

## 2011-11-01 VITALS — BP 133/76 | HR 101 | Ht 64.17 in | Wt 225.0 lb

## 2011-11-01 DIAGNOSIS — M1710 Unilateral primary osteoarthritis, unspecified knee: Secondary | ICD-10-CM

## 2011-11-01 MED ORDER — FREESTYLE TEST VI STRP *A*
ORAL_STRIP | Status: DC
Start: 2011-11-01 — End: 2011-11-21

## 2011-11-01 MED ORDER — FREESTYLE LANCETS MISC *A*
Status: DC
Start: 2011-11-01 — End: 2012-03-16

## 2011-11-01 NOTE — Progress Notes (Signed)
56 yo F here for bilateral knee pain x months. Left knee pain worse than right.   Using morphine for pain which she says gives her no relief. States she took daughters percocet which took her pain away for the whole night.   Patient had left knee injection 3 wks ago with no relief.   Feels sensation of both knees giving out on her, feels cracking in both knees. Has never tried a brace. States she will be starting PT.  She has locking sensation on bilateral knees.    OBJECTIVE:   Vital signs:  Noted in chart  General: Well developed, well nourished, no acute distress.   Mental Status:  Alert and oriented x3, pleasant and cooperative with exam  Extremities: no swelling, erythema ecchymoses or edema  Gait: limping gait   KNEE:  positive exam findings: pain with extreme flexion and extension   No swelling, erythema echymoses or deformity. No palpable effusion or warmth. Generalized pain in knee, not worse with palpation.  Full range of motion symmetric to opposite side. Neurovascularly intact.  Stable to ligamentous testing with negative anterior drawer, posterior drawer, valgus and varus stress.   Negative McMurray's.      Reviewed xrays of bilateral knees.     A&P   56 yo F with bilateral knee pain, moderate bilateral knee OA  -discussed with patient that she should follow with PCP for further pain control options  -offered steroid injection- she declines  -she will followup with Ortho for viscus supplementation   - weight loss discussed   - brace for left knee   -PT- states she is in process of setting this up     --------------------    Preceptor Attestation - [facility] Medicine     I have reviewed the history, exam findings, and plan of care and discussed the care plan with the resident at the time of the visit. I agree with the resident's findings and plan of care as documented above.    Laurena Spies, MD

## 2011-11-08 ENCOUNTER — Ambulatory Visit: Payer: Self-pay | Admitting: Family Medicine

## 2011-11-08 ENCOUNTER — Other Ambulatory Visit: Payer: Self-pay | Admitting: Family Medicine

## 2011-11-08 ENCOUNTER — Encounter: Payer: Self-pay | Admitting: Family Medicine

## 2011-11-08 VITALS — BP 148/90 | HR 77 | Ht 64.17 in | Wt 225.0 lb

## 2011-11-08 DIAGNOSIS — M1710 Unilateral primary osteoarthritis, unspecified knee: Secondary | ICD-10-CM

## 2011-11-08 MED ORDER — GENERIC DME *A*
Status: DC
Start: 2011-11-08 — End: 2012-03-07

## 2011-11-08 MED ORDER — DICLOFENAC POTASSIUM 50 MG PO TABS *I*
50.0000 mg | ORAL_TABLET | Freq: Three times a day (TID) | ORAL | Status: DC
Start: 2011-11-08 — End: 2011-12-19

## 2011-11-08 NOTE — Progress Notes (Addendum)
Assessment & Plan        Gloria Ray is a 56 y.o. female and  has a past medical history of Asthma; COPD (chronic obstructive pulmonary disease); Coronary artery disease; Diabetes mellitus; Hypertension; Ischemic Stroke (03/06/2006); Prior Myocardial Infarction (03/19/2007); and Unspecified cerebral artery occlusion with cerebral infarction.       The patient evaluated at clinic today for: Knee Pain      DJD (degenerative joint disease) of knee, bilateral      Known tricompartmental arthritis with functional limitation, failed conservative therapy so far.  While has shown functional benefit with opiate class medications, now showing behaviors concerning for diversion/dependence.   Referral to [facility], suspect she is a candidate for knee replacement, may need improved diabetes and HTN   Wrapped knees in ACE bandages, Rx for knee braces per patient request   Reconcile medications with ACS/VNS    Orders/Referrals:  DJD (degenerative joint disease) of knee, bilateral  - AMB REFERRAL TO ORTHOPEDIC SURGERY  - diclofenac potassium (CATAFLAM) 50 MG tablet; Take 1 tablet (50 mg total) by mouth 3 times daily  - generic DME; Dispense 2 knee braces.  Use as directed.       Subjective        Chief Complaint: Knee Pain    HPI     DJD (degenerative joint disease) of knee, bilateral       Patient reports bilateral knee pain, reports taking last of morphine today, the only thing that helps.  Reports need for me to "write it differently", apparently discussed with pharmacy.  Reports carrying groceries, hit hip on door frame with resultant swelling and pain.  Better today, does not think it is broken.  Reports did not receive knee brace from recent visit with Sports Med, declined further injections.  Has no appointment with Orthopedic surgery.      Pertinent positives and negatives of a focused review of systems noted above.         Objective   Recorded Vital Signs:  BP 148/90  Pulse 77  Ht 1.63 m (5' 4.17")  Wt 102.059 kg (225  lb)  BMI 38.41 kg/m2   Physical Exam   VITALS: Reviewed above and hypertensive.  BMI category is moderate obesity.  General appearance: alert, appears older than stated age and cooperative, mildly desperate affect  Head: Normocephalic, without obvious abnormality, atraumatic  Extremities: extremities normal, atraumatic, no cyanosis or edema, no edema, redness or tenderness in the calves or thighs and tenderness of knees to palpation, full ROM  Skin: Skin color, texture, turgor normal. No rashes or lesions  Neurologic: Alert and oriented to person and place, no obvious deficits.  Gait is slow but steady.  Balance is normal.  Strength is normal.    PHQ9 11/08/2011   PHQ-9 Total Score 16        Note in APSO format, see top for Assessment & Plan.     [name] MD, PhD   R2 [facility] Medicine   11/08/2011  2:35 PM     --------------------    Preceptor Attestation - Patient Seen    I saw and evaluated the patient. I agree with the resident's/fellow's findings and plan of care as documented above.     Quentin Angst, MD

## 2011-11-08 NOTE — Assessment & Plan Note (Signed)
S/O:       Patient reports bilateral knee pain, reports taking last of morphine today, the only thing that helps.  Reports need for me to "write it differently", apparently discussed with pharmacy.  Reports carrying groceries, hit hip on door frame with resultant swelling and pain.  Better today, does not think it is broken.  Reports did not receive knee brace from recent visit with Sports Med, declined further injections.  Has no appointment with Orthopedic surgery.  A/P:      Known tricompartmental arthritis with functional limitation, failed conservative therapy so far.  While has shown functional benefit with opiate class medications, now showing behaviors concerning for diversion/dependence.   Referral to [facility], suspect she is a candidate for knee replacement, may need improved diabetes and HTN   Wrapped knees in ACE bandages, Rx for knee braces per patient request   Reconcile medications with ACS/VNS

## 2011-11-15 ENCOUNTER — Other Ambulatory Visit: Payer: Self-pay | Admitting: Family Medicine

## 2011-11-17 ENCOUNTER — Encounter: Payer: Self-pay | Admitting: Family Medicine

## 2011-11-17 ENCOUNTER — Ambulatory Visit: Payer: Self-pay | Admitting: Family Medicine

## 2011-11-17 VITALS — BP 159/90 | HR 85 | Ht 64.17 in | Wt 225.0 lb

## 2011-11-17 DIAGNOSIS — G4700 Insomnia, unspecified: Secondary | ICD-10-CM

## 2011-11-17 DIAGNOSIS — M1710 Unilateral primary osteoarthritis, unspecified knee: Secondary | ICD-10-CM

## 2011-11-17 MED ORDER — TRAZODONE HCL 300 MG PO TABS *A*
450.0000 mg | ORAL_TABLET | Freq: Every evening | ORAL | Status: DC
Start: 2011-11-17 — End: 2011-12-11

## 2011-11-17 MED ORDER — MORPHINE SULFATE ER 15 MG PO TBCR *I*
30.0000 mg | ORAL_TABLET | Freq: Two times a day (BID) | ORAL | Status: DC
Start: 2011-11-17 — End: 2011-11-17

## 2011-11-17 MED ORDER — MORPHINE SULFATE ER 15 MG PO TBCR *I*
30.0000 mg | ORAL_TABLET | Freq: Two times a day (BID) | ORAL | Status: AC
Start: 2011-11-17 — End: 2011-12-01

## 2011-11-17 NOTE — Progress Notes (Signed)
Assessment & Plan        Gloria Ray is a 56 y.o. female and  has a past medical history of Asthma; COPD (chronic obstructive pulmonary disease); Coronary artery disease; Diabetes mellitus; Hypertension; Ischemic Stroke (03/06/2006); Prior Myocardial Infarction (03/19/2007); and Unspecified cerebral artery occlusion with cerebral infarction.       The patient evaluated today for: Knee Pain and Hip Pain      DJD (degenerative joint disease) of knee, bilateral      Severe pain with tricompartmental DJD, refractory to conservative treatment and steroid injection.  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   Will renew morphine on limited basis pending ortho eval and likely need for joint replacement given family history and failure of conservative measures   Goal of increased function, activity and weight loss   Renew trazodone for sleep per patient request, pain interfering with sleep, decreased need for opiate   Urine drug screen   Controlled substance contract reviewed   iStop reviewed, no issues, no ED visits.   F/u 2 weeks     Orders this visit:  DJD (degenerative joint disease) of knee, bilateral  - Drug screen,UR (comprehensive); Future  - morphine (MS CONTIN) 15 MG 12 hr tablet; Take 2 tablets (30 mg total) by mouth 2 times daily   MDD 60 mg.  14 day supply    Insomnia  - traZODone (DESYREL) 300 MG tablet; Take 1.5 tablets (450 mg total) by mouth nightly  - EKG 12 lead; Future     Subjective        Chief Complaint: Knee Pain and Hip Pain    HPI     DJD (degenerative joint disease) of knee, bilateral       Patient reports significant decreased function at home, increased services due to inability to walk and care for herself.  Was better with opiate analgesia, able to do ADLs.  Tried to set up Ortho appointment with [facility], rejected insurance.  Now with referral pending to [facility] Ortho.      Pertinent positives and negatives of a focused review of systems  noted above.       Objective   Recorded Vital Signs:  BP 159/90  Pulse 85  Ht 1.63 m (5' 4.17")  Wt 102.059 kg (225 lb)  BMI 38.41 kg/m2   Physical Exam   VITALS: Reviewed above and hypertensive.  BMI category is moderate obesity.  GEN: Well developed, well nourished, appears stated age, no acute distress  HEENT: Atraumatic.  EOM normal, conjunctiva clear.   EXT: Warm and well perfused.  No edema.  Knees tender to palpation at joint line.  Radial and dorsalis pedis pulses palpable.  SKIN: Warm and dry. No rashes, ecchymoses, or petechiae.  NEURO:  Alert.  Cranial nerves grossly intact.  Strength is normal and symmetric.  Gait is slow, abnormal using cane.  Balance is normal.    PSYCH:  Patient well dressed and well groomed, sitting in chair.  Speech is normal.  Mood is depressed.  Affect is congruent with mood.  Thought content is linear with no expressed paranoia, delusions, or hallucinations; no suicidal or homicidal ideation.  Judgement is normal.  Insight is poor.       Note in APSO format, see top for Assessment & Plan.     [name] MD, PhD   R2 [facility] Family Medicine   11/17/2011  11:44 AM        Family Medicine Attestation  I have reviewed the history, exam findings, and plan of care and discussed the care plan with the resident at the time of the visit. I agree with the resident's findings and plan of care as documented above.    Silvio Pate, MD  Family Medicine Attending Physician    Signed at 12:07 PM on 11/17/2011.

## 2011-11-21 ENCOUNTER — Other Ambulatory Visit: Payer: Self-pay | Admitting: Family Medicine

## 2011-11-21 MED ORDER — FREESTYLE TEST VI STRP *A*
ORAL_STRIP | Status: DC
Start: 2011-11-21 — End: 2012-01-24

## 2011-11-23 DIAGNOSIS — F32A Depression, unspecified: Secondary | ICD-10-CM

## 2011-11-24 NOTE — Assessment & Plan Note (Signed)
S/O:       Patient reports significant decreased function at home, increased services due to inability to walk and care for herself.  Was better with opiate analgesia, able to do ADLs.  Tried to set up Ortho appointment with [facility], rejected insurance.  Now with referral pending to [facility] Orth[facility].  A/P:      Severe pain with tricompartmental DJD, refractory to conservative treatment and steroid injection.  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   Will renew morphine on limited basis pending ortho eval and likely need for joint replacement given family history and failure of conservative measures   Goal of increased function, activity and weight loss   Renew trazodone for sleep per patient request, pain interfering with sleep, decreased need for opiate   Urine drug screen   Controlled substance contract reviewed   iStop reviewed, no issues, no ED visits.

## 2011-11-29 ENCOUNTER — Telehealth: Payer: Self-pay

## 2011-11-29 NOTE — Telephone Encounter (Signed)
Pt called [employer] looking for aid with setting up pills boxes and with injections and SW also to inquire about day programs (i.e. [facility]). Spoke with Intake at [facility] and they will do an eval for pt.

## 2011-11-30 ENCOUNTER — Telehealth: Payer: Self-pay

## 2011-11-30 NOTE — Telephone Encounter (Signed)
Patient opened to home care today, through [facility] and on assessment certain meds were noted to be missing from her med profile. Tylenol,vit D, cymbalta, potassium, lasix and penicillin.  Also will need a script for refill on morphine sulfate.  Scheduled to see you 12/31/11.  Thanks!

## 2011-12-01 ENCOUNTER — Ambulatory Visit: Payer: Self-pay | Admitting: Family Medicine

## 2011-12-01 ENCOUNTER — Encounter: Payer: Self-pay | Admitting: Family Medicine

## 2011-12-01 VITALS — BP 139/77 | HR 78 | Temp 96.1°F | Ht 64.17 in | Wt 222.0 lb

## 2011-12-01 DIAGNOSIS — M1710 Unilateral primary osteoarthritis, unspecified knee: Secondary | ICD-10-CM

## 2011-12-01 DIAGNOSIS — M25562 Pain in left knee: Secondary | ICD-10-CM

## 2011-12-01 DIAGNOSIS — M25561 Pain in right knee: Secondary | ICD-10-CM

## 2011-12-01 DIAGNOSIS — Z79899 Other long term (current) drug therapy: Secondary | ICD-10-CM

## 2011-12-01 DIAGNOSIS — Z0000 Encounter for general adult medical examination without abnormal findings: Secondary | ICD-10-CM

## 2011-12-01 MED ORDER — ACETAMINOPHEN 500 MG PO TABS *I*
ORAL_TABLET | ORAL | Status: DC
Start: 2011-12-01 — End: 2011-12-11

## 2011-12-01 MED ORDER — MORPHINE SULFATE ER 30 MG PO TBCR *I*
30.0000 mg | ORAL_TABLET | Freq: Two times a day (BID) | ORAL | Status: DC
Start: 2011-12-01 — End: 2011-12-19

## 2011-12-01 NOTE — Progress Notes (Signed)
Assessment & Plan        Gloria Ray is a 56 y.o. female and  has a past medical history of Asthma; COPD (chronic obstructive pulmonary disease); Coronary artery disease; Diabetes mellitus; Hypertension; Ischemic Stroke (03/06/2006); Prior Myocardial Infarction (03/19/2007); and Unspecified cerebral artery occlusion with cerebral infarction.       The patient evaluated today for: Follow-up, Medication Refill and Flu Vaccine      DJD (degenerative joint disease) of knee, bilateral      Severe pain with tricompartmental DJD, refractory to conservative treatment and steroid injection.  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   Renew morphine on limited basis pending ortho eval and likely need for joint replacement given family history and failure of conservative measures   Goal of increased function, activity and weight loss   Urine drug screen      iStop reviewed, no issues.    Orders this visit:  Routine adult health maintenance  - Flu vaccine greater than or equal to 3yo with preservative IM (FOR AMBULATORY USE ONLY)    DJD (degenerative joint disease) of knee, bilateral  - morphine (MS CONTIN) 30 MG 12 hr tablet; Take 1 tablet (30 mg total) by mouth 2 times daily   MDD 60 mg.  One month supply.    Knee pain, bilateral  - acetaminophen (TYLENOL) 500 mg tablet; Take 2 tabs (1000mg ) up to three time daily as needed for pain. Max 6 tabs (3000mg ); decrease by 1 tab for every Lortab taken daily.     Subjective        Chief Complaint: Follow-up, Medication Refill and Flu Vaccine    HPI     DJD (degenerative joint disease) of knee, bilateral       Patient reports improved function with long acting morphine.  Requesting one month supply as it is a challenge to get to physician appointments, has several other physician appointments in next several weeks. Reports some difficulty with transportation and the insurance company.  Referral pending to [facility] Ortho.  Denies  changes in baseline SOB, denies chest pain, nausea, headaches, vision changes, orthostasis, fever, chills.      Pertinent positives and negatives of a focused review of systems noted above.       Objective   Recorded Vital Signs:  BP 139/77  Pulse 78  Temp(Src) 35.6 C (96.1 F) (Temporal)  Ht 1.63 m (5' 4.17")  Wt 100.699 kg (222 lb)  BMI 37.9 kg/m2  SpO2 98%  Wt Readings from Last 3 Encounters:   12/01/11 100.699 kg (222 lb)   11/17/11 102.059 kg (225 lb)   11/08/11 102.059 kg (225 lb)      Physical Exam   VITALS: Reviewed above and WNL.  BMI category is moderate obesity.  Lost 3 lbs in 2 weeks.   GEN: Well developed, well nourished, appears stated age, no acute distress  HEENT: Atraumatic.  EOM normal, conjunctiva clear.   EXT: Warm and well perfused.  No edema.  Radial and dorsalis pedis pulses palpable.  SKIN: Warm and dry. No rashes, ecchymoses, or petechiae.  NEURO:  Alert.  Cranial nerves grossly intact.  Strength is normal and symmetric.  Gait is normal.  Balance is normal.    PSYCH:  Patient well dressed and well groomed, sitting in chair.  Speech is normal.  Mood is anxious.  Affect is congruent with mood.  Thought content is linear with no expressed paranoia, delusions, or hallucinations; no suicidal  or homicidal ideation.  Judgement is normal.  Insight is normal.       Note in APSO format, see top for Assessment & Plan.     [name] MD, PhD   R2 [facility] Medicine   12/01/2011  10:57 AM       --------------------    Preceptor Attestation - [facility] Medicine     I have reviewed the history, exam findings, and plan of care and discussed the care plan with the resident at the time of the visit. I agree with the resident's findings and plan of care as documented above.    Stoney Bang, MD

## 2011-12-04 ENCOUNTER — Other Ambulatory Visit: Payer: Self-pay | Admitting: Family Medicine

## 2011-12-05 ENCOUNTER — Telehealth: Payer: Self-pay

## 2011-12-05 NOTE — Telephone Encounter (Signed)
During PT visit pt c/o pain bilateral knees 6/10. Pt states she has not taken any pain medication as of that time. Pt advised on taking meds as prescribed regularly so pain does not get to such high levels.   This is and [place]

## 2011-12-07 ENCOUNTER — Ambulatory Visit: Payer: Self-pay | Admitting: Family Medicine

## 2011-12-08 ENCOUNTER — Telehealth: Payer: Self-pay

## 2011-12-08 DIAGNOSIS — Z79899 Other long term (current) drug therapy: Secondary | ICD-10-CM | POA: Insufficient documentation

## 2011-12-08 NOTE — Assessment & Plan Note (Signed)
S/O:       Patient reports improved function with long acting morphine.  Requesting one month supply as it is a challenge to get to physician appointments, has several other physician appointments in next several weeks. Reports some difficulty with transportation and the insurance company.  Referral pending to [facility].  Denies changes in baseline SOB, denies chest pain, nausea, headaches, vision changes, orthostasis, fever, chills.  A/P:      Severe pain with tricompartmental DJD, refractory to conservative treatment and steroid injection.  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   Renew morphine on limited basis pending ortho eval and likely need for joint replacement given family history and failure of conservative measures   Goal of increased function, activity and weight loss   Urine drug screen      iStop reviewed, no issues.

## 2011-12-08 NOTE — Telephone Encounter (Signed)
Physical therapist reports that patient had L arm pain today relieved by tylenol today . BP was 132/92 and 130/70 after resting . Has appointment with Dr Titus Mould next week .

## 2011-12-11 ENCOUNTER — Other Ambulatory Visit: Payer: Self-pay | Admitting: Family Medicine

## 2011-12-11 ENCOUNTER — Telehealth: Payer: Self-pay

## 2011-12-11 ENCOUNTER — Other Ambulatory Visit: Payer: Self-pay | Admitting: Primary Care

## 2011-12-11 DIAGNOSIS — G4700 Insomnia, unspecified: Secondary | ICD-10-CM

## 2011-12-11 MED ORDER — TRAZODONE HCL 300 MG PO TABS *A*
450.0000 mg | ORAL_TABLET | Freq: Every evening | ORAL | Status: DC
Start: 2011-12-11 — End: 2011-12-11
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
## 2011-12-12 ENCOUNTER — Ambulatory Visit: Payer: Self-pay | Admitting: Sports Medicine

## 2011-12-14 ENCOUNTER — Encounter: Payer: Self-pay | Admitting: Gastroenterology

## 2011-12-17 ENCOUNTER — Encounter: Payer: Self-pay | Admitting: Gastroenterology

## 2011-12-19 ENCOUNTER — Other Ambulatory Visit: Payer: Self-pay | Admitting: Family Medicine

## 2011-12-19 MED ORDER — ATORVASTATIN CALCIUM 10 MG PO TABS *I*
10.0000 mg | ORAL_TABLET | Freq: Every day | ORAL | Status: DC
Start: 2011-12-11 — End: 2012-01-17

## 2011-12-19 MED ORDER — MORPHINE SULFATE ER 30 MG PO TBCR *I*
60.0000 mg | ORAL_TABLET | Freq: Two times a day (BID) | ORAL | Status: DC | PRN
Start: 2011-12-19 — End: 2011-12-28

## 2011-12-19 MED ORDER — DULOXETINE HCL 60 MG PO CPEP *I*
60.0000 mg | DELAYED_RELEASE_CAPSULE | Freq: Every day | ORAL | Status: DC
Start: 2011-12-19 — End: 2012-02-21

## 2011-12-19 MED ORDER — INSULIN GLARGINE 100 UNIT/ML SC SOLN PEN *A*
80.0000 [IU] | Freq: Every evening | SUBCUTANEOUS | Status: DC
Start: 2011-12-11 — End: 2011-12-25

## 2011-12-19 MED ORDER — CLOPIDOGREL BISULFATE 75 MG PO TABS *I*
75.0000 mg | ORAL_TABLET | Freq: Every day | ORAL | Status: DC
Start: 2011-12-11 — End: 2012-03-29

## 2011-12-19 MED ORDER — ASPIRIN 325 MG PO TABS *I*
325.0000 mg | ORAL_TABLET | Freq: Every day | ORAL | Status: DC
Start: 2011-12-11 — End: 2012-03-29

## 2011-12-19 MED ORDER — AMLODIPINE BESYLATE 10 MG PO TABS *I*
10.0000 mg | ORAL_TABLET | Freq: Every day | ORAL | Status: DC
Start: 2011-12-19 — End: 2012-03-04

## 2011-12-19 MED ORDER — ACETAMINOPHEN 500 MG PO TABS *I*
ORAL_TABLET | ORAL | Status: DC
Start: 2011-12-11 — End: 2012-04-12

## 2011-12-19 MED ORDER — TRAZODONE HCL 300 MG PO TABS *A*
300.0000 mg | ORAL_TABLET | Freq: Every evening | ORAL | Status: DC
Start: 2011-12-11 — End: 2012-02-13

## 2011-12-19 MED ORDER — OMEPRAZOLE 20 MG PO CPDR *I*
DELAYED_RELEASE_CAPSULE | ORAL | Status: DC
Start: 2011-12-11 — End: 2012-03-29

## 2011-12-19 MED ORDER — INSULIN LISPRO (HUMAN) 100 UNIT/ML SC SOLN PEN *A*
30.0000 [IU] | Freq: Three times a day (TID) | SUBCUTANEOUS | Status: DC
Start: 2011-12-11 — End: 2011-12-26
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
## 2011-12-21 ENCOUNTER — Other Ambulatory Visit: Payer: Self-pay | Admitting: Family Medicine

## 2011-12-21 NOTE — Telephone Encounter (Signed)
Patient states she needs it before the weekend because she will then run out and the pharmacy is closed on the weekend.

## 2011-12-22 ENCOUNTER — Emergency Department
Admission: EM | Admit: 2011-12-22 | Disposition: A | Discharge status: Other Acute Inpatient Hospital | Payer: Self-pay | Source: Ambulatory Visit | Attending: Emergency Medicine | Admitting: Emergency Medicine

## 2011-12-22 ENCOUNTER — Other Ambulatory Visit: Payer: Self-pay | Admitting: Gastroenterology

## 2011-12-22 LAB — CBC
Hematocrit: 33 % — ABNORMAL LOW (ref 34–45)
Hemoglobin: 10 g/dL — ABNORMAL LOW (ref 11.2–15.7)
MCV: 82 fL (ref 79–95)
Platelets: 286 10*3/uL (ref 160–370)
RBC: 4 MIL/uL (ref 3.9–5.2)
RDW: 16.6 % — ABNORMAL HIGH (ref 11.7–14.4)
WBC: 7 10*3/uL (ref 4.0–10.0)

## 2011-12-22 LAB — BASIC METABOLIC PANEL
Anion Gap: 10 (ref 7–16)
CO2: 24 mmol/L (ref 20–28)
Calcium: 8.9 mg/dL (ref 8.6–10.2)
Chloride: 106 mmol/L (ref 96–108)
Creatinine: 1 mg/dL — ABNORMAL HIGH (ref 0.51–0.95)
GFR,Black: 73 *
GFR,Caucasian: 64 *
Glucose: 204 mg/dL — ABNORMAL HIGH (ref 60–99)
Lab: 20 mg/dL (ref 6–20)
Potassium: 3.4 mmol/L (ref 3.3–5.1)
Sodium: 140 mmol/L (ref 133–145)

## 2011-12-22 LAB — TROPONIN T: Troponin T: <0.01 ng/mL (ref 0.00–0.02)

## 2011-12-22 MED ORDER — METHYLPREDNISOLONE SOD SUCC 125 MG IJ SOLR(62.5MG/ML) *WRAPPED*
125.0000 mg | Freq: Once | INTRAMUSCULAR | Status: AC
Start: 2011-12-22 — End: 2011-12-22
  Administered 2011-12-22: 125 mg via INTRAVENOUS
  Filled 2011-12-22: qty 2

## 2011-12-22 MED ORDER — ALBUTEROL SULFATE (5 MG/ML) 0.5% NEBS SOLUTION *I*
2.5000 mg | INHALATION_SOLUTION | Freq: Once | RESPIRATORY_TRACT | Status: AC
Start: 2011-12-22 — End: 2011-12-22
  Administered 2011-12-22: 0 mg via RESPIRATORY_TRACT
  Filled 2011-12-22: qty 20

## 2011-12-22 MED ORDER — ALBUTEROL SULFATE (2.5 MG/3ML) 0.083% IN NEBU *I*
INHALATION_SOLUTION | RESPIRATORY_TRACT | Status: DC
Start: 2011-12-22 — End: 2011-12-23
  Filled 2011-12-22: qty 3

## 2011-12-22 MED ORDER — IPRATROPIUM-ALBUTEROL 18-103 MCG/ACT IN AERO *I*
1.0000 | INHALATION_SPRAY | Freq: Four times a day (QID) | RESPIRATORY_TRACT | Status: DC | PRN
Start: 2011-12-21 — End: 2012-01-31

## 2011-12-22 MED ORDER — BUDESONIDE-FORMOTEROL FUMARATE 160-4.5 MCG/ACT IN AERO *I*
2.0000 | INHALATION_SPRAY | Freq: Two times a day (BID) | RESPIRATORY_TRACT | Status: DC
Start: 2011-12-21 — End: 2012-01-31

## 2011-12-22 MED ORDER — ALBUTEROL SULFATE (2.5 MG/3ML) 0.083% IN NEBU *I*
INHALATION_SOLUTION | RESPIRATORY_TRACT | Status: AC
Start: 2011-12-22 — End: 2011-12-22
  Filled 2011-12-22: qty 3

## 2011-12-22 MED ORDER — ALBUTEROL SULFATE (2.5 MG/3ML) 0.083% IN NEBU *I*
2.5000 mg | INHALATION_SOLUTION | Freq: Once | RESPIRATORY_TRACT | Status: AC
Start: 2011-12-22 — End: 2011-12-22
  Administered 2011-12-22: 2.5 mg via RESPIRATORY_TRACT
  Filled 2011-12-22: qty 3

## 2011-12-22 NOTE — ED Notes (Signed)
Dyspnea/chest tightness since 7am. H/o COPD/CHF/cardiac stents. Initially wheezing per EMS. Given albuterol, atrovent and ASA with some relief.

## 2011-12-22 NOTE — ED Notes (Signed)
Report given to [place] at [facility].
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
## 2011-12-22 NOTE — Consults (Signed)
[facility]  Transportation Request Form / Physician Certification Statement   Please fax request with face sheet to [facility] Coordinator: Fax [phone], Phone [phone] or [employer] [facility]: Fax [phone], Phone [phone]    Patient Name:  Gloria Ray     Date of Birth:  1955-10-20    Date of Service: December 22, 2011  Requested time of pick up: as soon as possible  Patient Location:  AC23/AC-23L     MR#  [id number]  Patient Destination:HH N6-2-9  Number of steps into house?:none  Requestor's Name: Abelardo Diesel, BSW Call Back Number:#[phone] Payor :[facility] voucher    Transport for (check what type of treatment or service, at least one):    [x]  Discharge              []  LackDiagnostic Test      []  Evaluation Test         []  Procedure      Specify what type of treatment or service: Lack of available [facility] inpatient beds   Is this treatment or service available at sending facility?:      []  Yes    [x]  No    Requested Mode of Transport  []  One man Chairmobile** []  Two man Chairmobile**  (** See instruction  form)  Leg extension required:     []  Yes  []  No  Patient has own chair:  []  Yes    []  No   Wheelchair size:         []  Standard []  X-wide  []  XX-wide  []  Round Trip []  One Way  []  Stretcher Zenaida Niece (no medical attention needed)    []  [facility]  []  [facility]  VENDOR: [facility] _________   1. Medical condition that necessitates this mode of transport (i.e. oxygen, bed ridden, etc.):  COPD, atypical Chest pain  2. What medical services are to be provided by crew?   [x]  Oxygen Monitoring:  Amount:  2    LPM nasal canula  Can patient self-administer oxygen?  []  Yes [x]  No - What is limitation?   Doesn't have own tank     Does patient have own portable tank?    []  Yes     [x]  No   []  Airway Monitoring []  Monitor IV infusion []  Suctioning     [x]  Cardiac Monitoring []  Vital Signs  []  PRN Meds  []  Other_______________________________  []  None    3. Infection control needs  (i.e. ORSA/VRE/Cdiff)       []  Yes    [x]  No    4. What specific handling is required?    [x]  Positioning []  Orthopedic Device  []  Restraints []  Other:        Reason for above:  []  Flight Risk     []  Severe Pain []  Morbid Obesity       [x]  Fall Precaution   []  Ortho/Spine Precaution         []  Decubitis > Stage 2 []  Risk of injury to self/others    5.  Patient mental status? [x]  Normal cognition []  Disoriented      Specify:        []  Psychological Disorder Specify:       6. At time of transport is bed confinement ordered?  [x]  Yes     [x]  No              Is patient bed confined?    [x]  Yes     []   No    Medical condition for bed confinement: Atypical chest pain, chronic COPD    Social work name:  Abelardo Diesel, [place]         Date:  December 22, 2011        Signature:___________________________________________________    Title:   []  MD  []  PA  []  NP  []  CNS  []  RN  [x]  SW  []  Discharge Planner  Delbert Harness, [facility] [id number]

## 2011-12-23 ENCOUNTER — Inpatient Hospital Stay
Admit: 2011-12-23 | Disposition: A | Discharge status: Home / Self Care | Payer: Self-pay | Source: Other Acute Inpatient Hospital | Attending: Internal Medicine | Admitting: Internal Medicine

## 2011-12-23 ENCOUNTER — Encounter: Payer: Self-pay | Admitting: Internal Medicine

## 2011-12-23 DIAGNOSIS — J441 Chronic obstructive pulmonary disease with (acute) exacerbation: Principal | ICD-10-CM | POA: Diagnosis present

## 2011-12-23 LAB — POCT GLUCOSE
Glucose POCT: 355 mg/dL — ABNORMAL HIGH (ref 60–99)
Glucose POCT: 338 mg/dL — ABNORMAL HIGH (ref 60–99)
Glucose POCT: 260 mg/dL — ABNORMAL HIGH (ref 60–99)
Glucose POCT: 296 mg/dL — ABNORMAL HIGH (ref 60–99)

## 2011-12-23 LAB — TROPONIN T
Troponin T: <0.01 ng/mL (ref 0.00–0.02)
Troponin T: <0.01 ng/mL (ref 0.00–0.02)

## 2011-12-23 MED ORDER — MORPHINE SULFATE ER 60 MG PO TBCR *I*
60.0000 mg | ORAL_TABLET | Freq: Two times a day (BID) | ORAL | Status: DC | PRN
Start: 2011-12-23 — End: 2011-12-26
  Administered 2011-12-23 – 2011-12-25 (×3): 60 mg via ORAL
  Filled 2011-12-23 (×3): qty 1

## 2011-12-23 MED ORDER — ACETAMINOPHEN 500 MG PO TABS *I*
1000.0000 mg | ORAL_TABLET | Freq: Three times a day (TID) | ORAL | Status: DC | PRN
Start: 2011-12-23 — End: 2011-12-26
  Administered 2011-12-23 – 2011-12-25 (×4): 1000 mg via ORAL
  Filled 2011-12-23 (×4): qty 2

## 2011-12-23 MED ORDER — CLOPIDOGREL BISULFATE 75 MG PO TABS *I*
75.0000 mg | ORAL_TABLET | Freq: Every day | ORAL | Status: DC
Start: 2011-12-23 — End: 2011-12-26
  Administered 2011-12-23 – 2011-12-26 (×4): 75 mg via ORAL
  Filled 2011-12-23 (×5): qty 1

## 2011-12-23 MED ORDER — ATORVASTATIN CALCIUM 20 MG PO TABS *I*
10.0000 mg | ORAL_TABLET | Freq: Every day | ORAL | Status: DC
Start: 2011-12-23 — End: 2011-12-26
  Administered 2011-12-23 – 2011-12-25 (×3): 10 mg via ORAL
  Filled 2011-12-23 (×2): qty 1
  Filled 2011-12-23: qty 2

## 2011-12-23 MED ORDER — AMLODIPINE BESYLATE 5 MG PO TABS *I*
10.0000 mg | ORAL_TABLET | Freq: Every day | ORAL | Status: DC
Start: 2011-12-23 — End: 2011-12-26
  Administered 2011-12-23 – 2011-12-26 (×4): 10 mg via ORAL
  Filled 2011-12-23 (×5): qty 2

## 2011-12-23 MED ORDER — ALBUTEROL SULFATE (2.5 MG/3ML) 0.083% IN NEBU *I*
2.5000 mg | INHALATION_SOLUTION | RESPIRATORY_TRACT | Status: DC | PRN
Start: 2011-12-23 — End: 2011-12-26
  Administered 2011-12-23 – 2011-12-25 (×2): 2.5 mg via RESPIRATORY_TRACT
  Filled 2011-12-23 (×2): qty 3

## 2011-12-23 MED ORDER — SODIUM CHLORIDE 0.9 % INJ (FLUSH) WRAPPED *I*
3.0000 mL | Freq: Three times a day (TID) | Status: DC
Start: 2011-12-23 — End: 2011-12-26
  Administered 2011-12-23 – 2011-12-24 (×4): 3 mL via INTRAVENOUS

## 2011-12-23 MED ORDER — POLYETHYLENE GLYCOL 3350 PO PACK 17 GM *I*
17.0000 g | PACK | Freq: Every day | ORAL | Status: DC | PRN
Start: 2011-12-23 — End: 2011-12-26
  Administered 2011-12-23: 17 g via ORAL
  Filled 2011-12-23 (×2): qty 17

## 2011-12-23 MED ORDER — ENOXAPARIN SODIUM 40 MG/0.4ML IJ SOSY *I*
40.0000 mg | PREFILLED_SYRINGE | INTRAMUSCULAR | Status: DC
Start: 2011-12-23 — End: 2011-12-26
  Administered 2011-12-23 – 2011-12-26 (×4): 40 mg via SUBCUTANEOUS
  Filled 2011-12-23 (×4): qty 0.4

## 2011-12-23 MED ORDER — PREDNISONE 20 MG PO TABS *I*
60.0000 mg | ORAL_TABLET | Freq: Every day | ORAL | Status: DC
Start: 2011-12-23 — End: 2011-12-26
  Administered 2011-12-23 – 2011-12-26 (×4): 60 mg via ORAL
  Filled 2011-12-23 (×4): qty 3

## 2011-12-23 MED ORDER — BUPROPION HCL 100 MG PO TB12 *I*
100.0000 mg | ORAL_TABLET | Freq: Two times a day (BID) | ORAL | Status: DC
Start: 2011-12-23 — End: 2011-12-26
  Administered 2011-12-23 – 2011-12-26 (×7): 100 mg via ORAL
  Filled 2011-12-23 (×7): qty 1

## 2011-12-23 MED ORDER — IPRATROPIUM-ALBUTEROL 0.5-2.5 MG/3ML IN SOLN *I*
3.0000 mL | Freq: Four times a day (QID) | RESPIRATORY_TRACT | Status: DC
Start: 2011-12-23 — End: 2011-12-26
  Administered 2011-12-23 – 2011-12-26 (×15): 3 mL via RESPIRATORY_TRACT
  Filled 2011-12-23 (×15): qty 3

## 2011-12-23 MED ORDER — LOSARTAN POTASSIUM 25 MG PO TABS *I*
50.0000 mg | ORAL_TABLET | Freq: Every day | ORAL | Status: DC
Start: 2011-12-23 — End: 2011-12-26
  Administered 2011-12-23 – 2011-12-25 (×3): 50 mg via ORAL
  Filled 2011-12-23 (×5): qty 2

## 2011-12-23 MED ORDER — ASPIRIN 325 MG PO TABS *I*
325.0000 mg | ORAL_TABLET | Freq: Every day | ORAL | Status: DC
Start: 2011-12-23 — End: 2011-12-26
  Administered 2011-12-23 – 2011-12-26 (×4): 325 mg via ORAL
  Filled 2011-12-23 (×4): qty 1

## 2011-12-23 MED ORDER — INSULIN LISPRO (HUMAN) 100 UNIT/ML IJ/SC SOLN *WRAPPED*
30.0000 [IU] | Freq: Three times a day (TID) | SUBCUTANEOUS | Status: DC
Start: 2011-12-23 — End: 2011-12-25
  Administered 2011-12-23 – 2011-12-25 (×6): 30 [IU] via SUBCUTANEOUS

## 2011-12-23 MED ORDER — INSULIN GLARGINE 100 UNIT/ML SC SOLN *WRAPPED*
80.0000 [IU] | Freq: Every evening | SUBCUTANEOUS | Status: DC
Start: 2011-12-23 — End: 2011-12-24
  Administered 2011-12-23 (×2): 80 [IU] via SUBCUTANEOUS

## 2011-12-23 MED ORDER — TRAZODONE HCL 50 MG PO TABS *I*
300.0000 mg | ORAL_TABLET | Freq: Every evening | ORAL | Status: DC
Start: 2011-12-23 — End: 2011-12-26
  Administered 2011-12-23 – 2011-12-25 (×4): 300 mg via ORAL
  Filled 2011-12-23 (×4): qty 6

## 2011-12-23 MED ORDER — AZITHROMYCIN 250 MG PO TABS *I*
250.0000 mg | ORAL_TABLET | Freq: Every day | ORAL | Status: DC
Start: 2011-12-24 — End: 2011-12-26
  Administered 2011-12-24 – 2011-12-26 (×3): 250 mg via ORAL
  Filled 2011-12-23 (×3): qty 1

## 2011-12-23 MED ORDER — AZITHROMYCIN 250 MG PO TABS *I*
500.0000 mg | ORAL_TABLET | Freq: Once | ORAL | Status: AC
Start: 2011-12-23 — End: 2011-12-23
  Administered 2011-12-23: 500 mg via ORAL
  Filled 2011-12-23: qty 2

## 2011-12-23 MED ORDER — BUPROPION HCL 100 MG PO TB12 *I*
100.0000 mg | ORAL_TABLET | Freq: Two times a day (BID) | ORAL | Status: DC
Start: 2011-12-23 — End: 2011-12-23

## 2011-12-23 MED ORDER — BUDESONIDE-FORMOTEROL FUMARATE 160-4.5 MCG/ACT IN AERO *I*
2.0000 | INHALATION_SPRAY | Freq: Two times a day (BID) | RESPIRATORY_TRACT | Status: DC
Start: 2011-12-23 — End: 2011-12-26
  Administered 2011-12-23 – 2011-12-26 (×7): 2 via RESPIRATORY_TRACT
  Filled 2011-12-23: qty 6

## 2011-12-23 MED ORDER — DULOXETINE HCL 20 MG PO CPEP *I*
60.0000 mg | DELAYED_RELEASE_CAPSULE | Freq: Every day | ORAL | Status: DC
Start: 2011-12-23 — End: 2011-12-26
  Administered 2011-12-23 – 2011-12-26 (×4): 60 mg via ORAL
  Filled 2011-12-23 (×4): qty 3

## 2011-12-23 MED ORDER — INSULIN GLARGINE 100 UNIT/ML SC SOLN *WRAPPED*
10.0000 [IU] | Freq: Once | SUBCUTANEOUS | Status: AC
Start: 2011-12-23 — End: 2011-12-23
  Administered 2011-12-23: 10 [IU] via SUBCUTANEOUS

## 2011-12-23 MED ORDER — PANTOPRAZOLE SODIUM 40 MG PO TBEC *I*
40.0000 mg | DELAYED_RELEASE_TABLET | Freq: Every morning | ORAL | Status: DC
Start: 2011-12-23 — End: 2011-12-26
  Administered 2011-12-23 – 2011-12-26 (×4): 40 mg via ORAL
  Filled 2011-12-23 (×4): qty 1

## 2011-12-23 NOTE — Discharge Instructions (Addendum)
Brief Summary of Your Hospital Course (including key procedures and diagnostic test results):    You were admitted to the hospital because your COPD was acting up. This happened because you got a cold. You will continue to treat this at home with your usual COPD medications, except we are also giving you some additional medicines that will help you breath better.    Your instructions:  1.) Take your medications as directed- you will be taking 1 more day of azithromycin and prednisone at home. Then your treatment will be completed.    2.) If you are getting worsening shortness of breath/ can't catch your breath- call [facility].     3.) Please call call [address] and make an appointment for Friday, 12/29/11.  She should ask to speak to a case manager to set up transportation.    What to do after you leave the hospital:    Recommended diet: diabetic diet    Recommended activity: activity as tolerated    Wound Care: none needed    If you experience any of these symptoms within the first 24 hours after discharge:Pain, Uncontrolled pain, Chest pain, Shortness of breath, Fever of 101 F. or greater, Chills, Increased redness, drainage or swelling from incision site, Irritability, Poor feeding, Poor urinary output, Vomiting, Nausea, Diarrhea, Blood in stool, Loss of consciousness, New or worsening headache or Weakness  please follow up with the discharge attending Dr. Arnoldo Hooker at phone-number: [phone]    If you experience any of these symptoms 24 hours or more after discharge  please follow up with your PCP:  Lavonne Chick, MD,PHD [phone]    Medication List    START taking these medications         azithromycin 250 MG tablet   Commonly known as:  ZITHROMAX   Take 1 tablet (250 mg total) by mouth daily for 1 dose       polyethylene glycol powder   Commonly known as:  GLYCOLAX   Take 17 g by mouth daily   Mix in 8 oz water or juice and drink.       predniSONE 20 MG tablet   Commonly known as:   DELTASONE   Take 3 tablets (60 mg total) by mouth daily             CHANGE how you take these medications         insulin glargine 100 UNIT/ML injection vial   Commonly known as:  LANTUS   Inject 90 Units into the skin nightly   Maximum 90 Units/day   What changed:  - medication strength  - how much to take  - additional instructions       insulin lispro 100 UNIT/ML injection pen   Commonly known as:  HumaLOG KWIKPEN   Inject 15 Units into the skin 3 times daily (before meals)   What changed:  how much to take             CONTINUE taking these medications         acetaminophen 500 mg tablet   Commonly known as:  TYLENOL   Take 2 tabs (1000mg ) up to three time daily as needed for pain. Max 6 tabs (3000mg ); decrease by 1 tab for every Lortab taken daily.       AEROCHAMBER Z-STAT PLUS/SMALL spacer device   Use as directed.       albuterol (2.5 mg/45mL) 0.083% nebulizer solution   Commonly known as:  PROVENTIL  USE 1 VIAL IN NEBULIZER (2.5MG  TOTAL) EVERY 4-6 HOURS AS NEEDED FOR WHEEZING OR SHORTNESS OF BREATH       amLODIPine 10 MG tablet   Commonly known as:  NORVASC   Take 1 tablet (10 mg total) by mouth daily       aspirin 325 MG tablet   Take 1 tablet (325 mg total) by mouth daily       atorvastatin 10 MG tablet   Commonly known as:  LIPITOR   Take 1 tablet (10 mg total) by mouth daily (with dinner)       budesonide-formoterol 160-4.5 MCG/ACT inhaler   Commonly known as:  SYMBICORT   Inhale 2 puffs into the lungs 2 times daily       buPROPion 100 MG 12 hr tablet   Commonly known as:  WELLBUTRIN SR       clopidogrel 75 MG tablet   Commonly known as:  PLAVIX   Take 1 tablet (75 mg total) by mouth daily       dextrose 40 % oral gel   Commonly known as:  GLUTOSE   Take 15 g by mouth once as needed for Low blood sugar       DULoxetine 60 MG capsule   Commonly known as:  CYMBALTA   Take 1 capsule (60 mg total) by mouth daily       FREESTYLE test strip   Use as directed up to 3 times daily.  Diabetes mellitus type 2,  uncontrolled.  ICD9 250.2       generic DME   Dispense 2 knee braces.  Use as directed.       insulin pen needle 31G X 5 MM   Commonly known as:  BD ULTRA-FINE PEN NEEDLE MINI   Use 3 times a day as instructed.       * ipratropium-albuterol 0.5-2.5 (3) MG/3ML nebulizer solution   Commonly known as:  DUONEB   Take 3 mLs by nebulization 4 times daily       * albuterol-ipratropium 18-103 MCG/ACT inhaler   Commonly known as:  COMBIVENT   Inhale 1-2 puffs into the lungs every 6 hours as needed for Wheezing   Shake well before each use.       * lancets   Brand Truetest; Use 3 times per day as directed for blood glucose testing.       * lancets   Use up to 3  times per day as instructed for blood glucose testing.  Diabetes mellitus, uncontrolled.  ICD9 250.2       losartan 50 MG tablet   Commonly known as:  COZAAR       morphine 30 MG 12 hr tablet   Commonly known as:  MS CONTIN   Take 2 tablets (60 mg total) by mouth 2 times daily as needed for Severe Pain   MDD 120 mg.  7 day supply. Ok to fill early, dose increase.       nebulizer device   As directed       omeprazole 20 MG capsule   Commonly known as:  PriLOSEC   TAKE 1 CAPSULE DAILY.       starch powder packet   Commonly known as:  THICK-IT 2   Add to liquid or pureed foods for thickening as needed.       traZODone 300 MG tablet   Commonly known as:  DESYREL   Take 1 tablet (300 mg total) by mouth nightly       *  Notice:  This list has 4 medication(s) that are the same as other medications prescribed for you. Read the directions carefully, and ask your doctor or other care provider to review them with you.          STOP taking these medications         STOOL SOFTENER 100 MG capsule   Generic drug:  docusate sodium       THICK-IT powder packet   Generic drug:  starch-maltodextrin               Where to Get Your Medications    These are the prescriptions that you need to pick up.        You may get the following medications from any pharmacy   -  azithromycin 250 MG  tablet   -  insulin glargine 100 UNIT/ML injection vial   -  insulin lispro 100 UNIT/ML injection pen   -  polyethylene glycol powder   -  predniSONE 20 MG tablet

## 2011-12-23 NOTE — Progress Notes (Signed)
Writer removed IV as it was occluded. Patient did not want a new IV placed. Writer paged Kayren Eaves, [place] to verify it was OK to leave patient without IV access. Order in place for no IV access as of 2059. Domenick Gong, RN

## 2011-12-23 NOTE — Plan of Care (Signed)
Problem: Safety  Goal: Patient will remain free of falls  Outcome: Maintaining  Patient uses call bell appropriately and ambulates independently in her room. Patient free of falls this shift.    Problem: Pain/Comfort  Goal: Patient's pain or discomfort is manageable  Outcome: Maintaining  Patient denied pain during shift. Will continue to monitor.
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
## 2011-12-23 NOTE — ED Notes (Signed)
EMS personnel here for pt. Pt used bathroom prior to leaving. Pt to [facility] in stable condition.
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
## 2011-12-23 NOTE — Progress Notes (Signed)
Pt arrive from [facility] ED via stretcher on 2LNC O2 . On arrival pt  Have expiratory wheezing. Abl to ambulate to BR with minimal supervision. C/O HA and pain on her chest when she take a deep breath. Neb tx and tylenol PRN medicated with good effect. Will continue to monitor closely.

## 2011-12-24 LAB — POCT GLUCOSE
Glucose POCT: 349 mg/dL — ABNORMAL HIGH (ref 60–99)
Glucose POCT: 122 mg/dL — ABNORMAL HIGH (ref 60–99)
Glucose POCT: 50 mg/dL — ABNORMAL LOW (ref 60–99)
Glucose POCT: 99 mg/dL (ref 60–99)
Glucose POCT: 139 mg/dL — ABNORMAL HIGH (ref 60–99)
Glucose POCT: 179 mg/dL — ABNORMAL HIGH (ref 60–99)

## 2011-12-24 MED ORDER — DEXTROMETHORPHAN POLISTIREX 30 MG/5ML PO LQCR *A*
30.0000 mg | Freq: Once | ORAL | Status: AC
Start: 2011-12-24 — End: 2011-12-24
  Administered 2011-12-24: 30 mg via ORAL
  Filled 2011-12-24: qty 5

## 2011-12-24 MED ORDER — INSULIN GLARGINE 100 UNIT/ML SC SOLN *WRAPPED*
10.0000 [IU] | Freq: Once | SUBCUTANEOUS | Status: AC
Start: 2011-12-24 — End: 2011-12-24
  Administered 2011-12-24: 10 [IU] via SUBCUTANEOUS

## 2011-12-24 MED ORDER — POLYETHYLENE GLYCOL 3350 PO PACK 17 GM *I*
17.0000 g | PACK | Freq: Every day | ORAL | Status: DC
Start: 2011-12-24 — End: 2011-12-25
  Administered 2011-12-24 – 2011-12-25 (×2): 17 g via ORAL
  Filled 2011-12-24 (×2): qty 17

## 2011-12-24 MED ORDER — INSULIN GLARGINE 100 UNIT/ML SC SOLN *WRAPPED*
90.0000 [IU] | Freq: Every evening | SUBCUTANEOUS | Status: DC
Start: 2011-12-24 — End: 2011-12-26
  Administered 2011-12-24 – 2011-12-25 (×2): 90 [IU] via SUBCUTANEOUS

## 2011-12-24 NOTE — Progress Notes (Signed)
Pt sat checked on RA 95-96% and O2 d/cd per MD's orders. Extra 10 units of Lantus  Given at 10am 1x per MD's orders. Bg 50 when checked before lunch. Pt asymptomatic and she  ate lunch.  Bg rechecked results being 99. Dr. Reggy Eye notified and insulin held at 11:30. Pt cont to cough up white thick sputum and has wheezes. Neb treatments cont. Crackles in left lower base and poor air exchange in both lungs. Pt reports that she feels better today and is looking forward to being discharged tomorrow. Will cont to monitor. Suzette Battiest RN

## 2011-12-24 NOTE — Plan of Care (Signed)
Patient will maintain adequate oxygenation Pt remains of O2. Neb treatments cont by resp therapy.  Progressing towards goal      Patient will maintain patent airway pt encourage to walk., deep breath, and cont neb treatments ATC and PRN Progressing towards goal      Patient will achieve/maintain normal respiratory rate/effort RR rate will be monitored.  Progressing towards goal

## 2011-12-24 NOTE — Plan of Care (Signed)
Problem: Pain/Comfort  Goal: Patient's pain or discomfort is manageable  Outcome: Progressing towards goal  Reinforced to pt the need for increased activity.she states she understands and will initiate walking this pm after visiting hrs. In the hall.continue to assess and monitor progression or need for changes.denies pain at this time.
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
## 2011-12-24 NOTE — Progress Notes (Signed)
0232 BG 349. [employer] notified. No new orders. Will continue to monitor.

## 2011-12-24 NOTE — Discharge Summary (Signed)
Discharge Summary       Admit date: 12/23/2011         Discharge date and time: 12/26/2011, 1059h  Admitting Physician: Arnoldo Hooker, MD   Discharge Attending: Arnoldo Hooker, MD    Patient: Gloria Ray Age: 56 y.o. Date of Birth: 10/11/1955 [id number]    Chief Complaint: SOB, Chest Pain   Principal Problem: COPD with exacerbation    Details of Admission: as per admission H&P    Discharge Diagnoses:  Active Hospital Problems    Diagnosis   . COPD with exacerbation   . Depression     Needs PHQ at next visit  PHQ9 11/17/2011   PHQ-9 Total Score 14          . Coronary Artery Disease     Created by Conversion       . Type II, uncontrolled     Insulin dependent DM II.         Marland Kitchen Insomnia     Patient prescribed trazodone after hospitalization, previously on zolpidem.     . Hypertension     Created by Conversion       . Asthma     Created by Conversion          Resolved Hospital Problems    Diagnosis   No resolved problems to display.       Hospital Course (including key diagnostic test results):       Patient transported by ambulance to ED for SOB and CP, initially evaluated and treated at [facility] , found to be low risk for ACS and imaging negative for PNA.      COPD, Acute Exacerbation:       Inciting event appears to be URI and missing medications in transition to [facility].  At [facility] given IV methylprednisolone and Duonebs.  Transferred to [facility], responded well to oral steroids and antibiotics for increased volume and purulence of sputum.  Received O2 briefly for O2 sat of 91%, no O2 prior to discharge.  Given Rx for total 5-day course of prednisone and azithromycin.     Cardiovascular:       Ruled out ACS, serial troponins negative x3.  Continued on home aspirin, Plavix, and anti-hypertensives.  Borderline HTN control inpatient.  Intermittent tachycardia and PVCs noted on tele.    Diabetes, uncontrolled:       Lantus increased to 90 Units qhs for elevated BGs to 350s s/p steroids,  Fasting BG  77-127 after increase.  Symptomatic hypoglycemia to 50 noted after Humalog given at breakfast, associated with decreased intake and diabetic diet likely not followed strictly at home.  Meal time insulin decreased to 15 Units on discharge to prevent hypoglycemia.    Discharge:  Patient previously staying at [facility] shelter, but on morning of discharge no beds were available.  Patient discharged to emergency housing, planning to call [facility] for readmission when possible.    Key Exam Findings at Discharge:    Vitals: Blood pressure 130/70, pulse 78, temperature 35.8 C (96.4 F), temperature source Temporal, resp. rate 18, SpO2 92.00%.    Pending Test Results: none    Consulting Providers: none    Discharged Condition: good    Discharge medications, instructions, and follow-up plans: as per After Visit Summary  Disposition: Emergency Housing, [facility] shelter when available.      Signed: [name], MD  On: 12/24/2011  at: 7:37 PM

## 2011-12-25 ENCOUNTER — Ambulatory Visit: Payer: Self-pay | Admitting: Family Medicine

## 2011-12-25 LAB — POCT GLUCOSE
Glucose POCT: 96 mg/dL (ref 60–99)
Glucose POCT: 127 mg/dL — ABNORMAL HIGH (ref 60–99)
Glucose POCT: 110 mg/dL — ABNORMAL HIGH (ref 60–99)
Glucose POCT: 50 mg/dL — ABNORMAL LOW (ref 60–99)
Glucose POCT: 84 mg/dL (ref 60–99)
Glucose POCT: 261 mg/dL — ABNORMAL HIGH (ref 60–99)
Glucose POCT: 220 mg/dL — ABNORMAL HIGH (ref 60–99)
Glucose POCT: 199 mg/dL — ABNORMAL HIGH (ref 60–99)

## 2011-12-25 MED ORDER — AZITHROMYCIN 250 MG PO TABS *I*
250.0000 mg | ORAL_TABLET | Freq: Every day | ORAL | Status: DC
Start: 2011-12-25 — End: 2011-12-26

## 2011-12-25 MED ORDER — POLYETHYLENE GLYCOL 3350 PO PACK 17 GM *I*
34.0000 g | PACK | Freq: Two times a day (BID) | ORAL | Status: DC
Start: 2011-12-25 — End: 2011-12-25

## 2011-12-25 MED ORDER — PREDNISONE 20 MG PO TABS *I*
60.0000 mg | ORAL_TABLET | Freq: Every day | ORAL | Status: DC
Start: 2011-12-25 — End: 2011-12-26

## 2011-12-25 MED ORDER — POLYETHYLENE GLYCOL 3350 PO PACK 17 GM *I*
34.0000 g | PACK | Freq: Once | ORAL | Status: DC
Start: 2011-12-25 — End: 2011-12-25

## 2011-12-25 MED ORDER — INSULIN LISPRO (HUMAN) 100 UNIT/ML IJ/SC SOLN *WRAPPED*
25.0000 [IU] | Freq: Three times a day (TID) | SUBCUTANEOUS | Status: DC
Start: 2011-12-25 — End: 2011-12-26
  Administered 2011-12-25: 25 [IU] via SUBCUTANEOUS

## 2011-12-25 MED ORDER — POLYETHYLENE GLYCOL 3350 PO POWD *I*
17.0000 g | Freq: Every day | ORAL | Status: DC
Start: 2011-12-25 — End: 2011-12-28

## 2011-12-25 MED ORDER — INSULIN GLARGINE 100 UNIT/ML SC SOLN *WRAPPED*
90.0000 [IU] | Freq: Every evening | SUBCUTANEOUS | Status: DC
Start: 2011-12-25 — End: 2012-01-17

## 2011-12-25 MED ORDER — SENNOSIDES 8.8 MG/5ML PO SYRP *I*
17.6000 mg | ORAL_SOLUTION | Freq: Once | ORAL | Status: AC
Start: 2011-12-25 — End: 2011-12-25
  Administered 2011-12-25: 17.6 mg via ORAL
  Filled 2011-12-25 (×2): qty 10

## 2011-12-25 MED ORDER — POLYETHYLENE GLYCOL 3350 PO PACK 17 GM *I*
17.0000 g | PACK | Freq: Every day | ORAL | Status: AC
Start: 2011-12-25 — End: 2011-12-25
  Administered 2011-12-25: 17 g via ORAL
  Filled 2011-12-25: qty 17

## 2011-12-25 MED ORDER — BISACODYL 10 MG RE SUPP *I*
10.0000 mg | Freq: Once | RECTAL | Status: AC
Start: 2011-12-25 — End: 2011-12-25
  Administered 2011-12-25: 10 mg via RECTAL

## 2011-12-25 NOTE — Progress Notes (Signed)
Physical therapy    Initial evaluation completed.       12/25/11 1656   Prior Function Level   Additional Comments Independent with a straight cane   PT Tracking   PT TRACKING PT Discontinue   Visit Number   Visit Number 1   Precautions/Observations   Precautions used x   Fall Precautions General falls precautions   Other To wear shoes while walking   Pain Assessment   *Is the patient currently in pain? Denies   Cognition   Cognition No deficit noted   UE Assessment   UE Assessment Full range RUE AROM;Full range LUE AROM   LUE Strength   Overall Strength WFL able to perform ADL tasks with strength   RUE Strength   Overall Strength WFL able to perform ADL tasks with strength   LE Assessment   LE Assessment Full AROM RLE;Full AROM LLE   Strength LLE   Overall Strength WFL able to perform ADL tasks with strength   Strength RLE   Overall Strength WFL able to perform ADL tasks with strength   Bed Mobility   Bed mobility x   Rolling Independent   Supine to Sit Independent   Sit to Supine Independent   Transfers   Transfers x   Stand Pivot Transfers Independent   Bed to Chair Independent   Sit to Stand Independent   Stand to sit Independent   Transfer Assistive Device none   Mobility   Mobility X   Gait Pattern Decreased cadence;Decreased R step height;Decreased L step height  (widened base of support; )   Ambulation Assist Modified independent (device)   Ambulation Distance (Feet) 100 ft; seated rest 100 ft.   Ambulation Assistive Device cane   Ramps Modified independent (device)   Additional comments Shoes on while walking   Balance   Additional Comments See balance test below   Assessment   Brief Assessment Patient at baseline function   Plan/Recommendation   Treatment Interventions No further PT interventions;D/C planning   PT Frequency One time visit   Hospital Stay Recommendations Other (comment)  (Independent in room and to walk with a cane with supervisio)   Discharge Recommendations Anticipate return to prior  living arrangement   Assessment/Recommendations Reviewed With: Patient   Time Calculation   PT Start Time 1635   PT Stop Time 1654   Time Calculation 19   PT Charges   PT [facility] Charges Eval 15   Tinetti   Sitting Balance 1   Arises 1   Attempts to Rise 2   Immediate Standing Balance (First 5 Seconds) 1   Standing Balance 1   Nudged 2   Eyes Closed 1   Turned 360 Degrees: Steadiness 1   Turned 360 Degrees: Continuity of Steps 1   Sitting Down 1   Balance Score (_____/16) 12   Initiation of Gait 1   Step Height: L Swing Foot 1   Step Length: L Swing Foot 1   Step Height: R Swing Foot 1   Step Length: R Swing Foot 1   Step Symmetry 1   Step Continuity 1   Path 1   Trunk Stability 0   Base of Support 0   Gait Score (____/12) 8   Total Score 20   Tinetti Parameters Tinetti Score Interpretation:  (Lewis C. Balance, Gait Test Proves Simple Yet Useful. P.T. Bulletin 1993; 2/10:9 & 40. Tinetti ME. Performance-Oriented Assessment of Mobility Problems in Elderly Patients. Tinetti, ME, at al, 1986)  Scores between 24-28: low fall risk   Scores between 19-23: moderate fall risk   Scores between 0-18: high fall risk         Nicoletta Ba, PT  Pager: [phone]

## 2011-12-25 NOTE — Progress Notes (Signed)
-----------------------------      FM Attending - Seen/Examined    I saw and evaluated the patient. I agree with the resident's/fellow's findings and plan of care as documented above.    Key Hx: Breathing somewhat improved. Less cough. Has abd distention and discomfort with constipation - last BM 3.5 days ago, usual pattern is q2days. Also has depressed mood and wishes to set up with an outpt therapist.    Key PE: Alert female in NAD.  BP 152/84  Pulse 82  Temp(Src) 36.4 C (97.5 F) (Temporal)  Resp 20  SpO2 96%  CHEST: fair air movement bilat, no wheezing noted    Remainder of exam per Dr. Juliane Poot.    Labs and imaging reviewed.    A/P: 56 yo female with acute COPD exacerbation. Continued improvement on steroids, abx, and nebs. Still with some hypoglycemia - will reduce insulin as no evidence of steroid-induced hyperglycemia. Aggressive bowel regimen. Anticipate D/C tomorrow.    Arnoldo Hooker, MD

## 2011-12-25 NOTE — Progress Notes (Signed)
Assessment & Plan        Gloria Ray is a 56 y.o. female and  has a past medical history of Asthma; COPD (chronic obstructive pulmonary disease); Coronary artery disease; Diabetes mellitus; Hypertension; Ischemic Stroke (03/06/2006); and Prior Myocardial Infarction (03/19/2007).    FULL CODE   DVT ppx: Lovenox     Order Review  GI: Constipation, s/p BM 10/21.  On morphine for DJD, constipating agent.  -Miralax back to 17g daily plus 17g PRN     Endo: DM2, uncontrolled. HbA1c 10.0. Well controlled with current insulin regimen and inpatient diabetic diet.  Hypoglycemia after short acting insulin doses  - Continue Lantus 90 Units qhs (split into 45Units x2)   - Agree with decreasing Humalog to 25 Units TID AC, consider hold vs. further reduction if hypoglycemia after dinner    Dispo:   - Anticipating discharge tomorrow (10/22) morning, needs cab back to [facility] shelter  - Patient to call for acute appointment this week at [facility] for hospital f/u  - Family Meeting Wednesday 10/23 at [facility]        Subjective           Visited patient as not discharged due to persistent constipation and recurrent hypoglycemia.  Spoke with patient and she is feeling much better s/p BM.  Anticipating discharge tomorrow morning, requests it be around 9AM.  Needs cab coordinated by social work.  Reports 2 episodes of hypoglycemia, symptomatic.  Has been walking.  Also discussed having a family meeting with daughters to discuss medical conditions and housing options.  Spoke with Gloria Ray (daughter) by phone, anticipating she a sister Gloria Ray to be in attendance.       Objective   Recorded Vital Signs:  BP 152/84  Pulse 82  Temp(Src) 36.4 C (97.5 F) (Temporal)  Resp 20  SpO2 96%   Physical Exam   VITALS: Reviewed above and hypertensive.   GEN: Well developed, well nourished, appears stated age, no acute distress   PSYCH:  Patient dressed in hospital scrubs and well groomed, sitting in chair.  Speech is baseline for patient, mild  intermittent expressive aphasia.  Mood is "better".  Affect is congruent with mood.  Thought content is linear with no expressed paranoia, delusions, or hallucinations; no suicidal or homicidal ideation.  Judgement is normal.  Insight is normal.      Labs  Recent Results (from the past 24 hour(s))   POCT GLUCOSE    Collection Time    12/24/11  8:35 PM       Result Value    Glucose POCT 179    POCT GLUCOSE    Collection Time    12/25/11  2:59 AM       Result Value    Glucose POCT 96    POCT GLUCOSE    Collection Time    12/25/11  7:34 AM       Result Value    Glucose POCT 127    POCT GLUCOSE    Collection Time    12/25/11 10:46 AM       Result Value    Glucose POCT 50    POCT GLUCOSE    Collection Time    12/25/11 11:14 AM       Result Value    Glucose POCT 110    POCT GLUCOSE    Collection Time    12/25/11 12:42 PM       Result Value    Glucose POCT 84  POCT GLUCOSE    Collection Time    12/25/11  4:05 PM       Result Value    Glucose POCT 261           Note in APSO format, see top for Assessment & Plan.     Gloria Patterson MD, PhD   R2 [facility] Family Medicine   12/25/2011  5:16 PM

## 2011-12-25 NOTE — Plan of Care (Signed)
Safety    . Patient will remain free of falls Maintaining    Pt uses call bell appropriately. Ambulated 70 ft with pt today, pt tolerated well.      Impaired Gas Exchange    . Patient will maintain adequate oxygenation Progressing towards goal    Pts last sat was 96 on room air.      Pain/Comfort    . Patient's pain or discomfort is manageable Progressing towards goal    Pt requested PRN tylenol for headache and PRN morphine for pain.

## 2011-12-25 NOTE — Progress Notes (Signed)
Patient states that she has services through [facility] when not at Shelter.  Per Mar Daring, patient receives aide service through [facility] for a Personal Care Assistant.  HCR can resume services when patient leaves shelter. A Nursing referral can be made at that time if additional Nursing services are needed beyond the aide service.  Otis Brace, RN, MS  Care Coordinator pager  [phone]  12/25/2011  4:02 PM

## 2011-12-25 NOTE — Progress Notes (Signed)
Physical Therapy:    Pt not available at this time for physical therapy intervention. with medical staff receiving an enema. Will follow up later as schedule allows.    Nicoletta Ba, PT  Pager: [phone]
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
## 2011-12-25 NOTE — Progress Notes (Addendum)
Pt felt her blood sugar was getting low. Pt's BG was 50. Ice cream, orange juice, and graham crackers were given to pt. Pt reports still feeling like her sugar is low. Will recheck in 10 min. Provider was notified.     10 min later BG is 110.

## 2011-12-26 ENCOUNTER — Encounter: Payer: Self-pay | Admitting: Family Medicine

## 2011-12-26 ENCOUNTER — Telehealth: Payer: Self-pay | Admitting: Licensed Practical Nurse

## 2011-12-26 LAB — POCT GLUCOSE
Glucose POCT: 171 mg/dL — ABNORMAL HIGH (ref 60–99)
Glucose POCT: 77 mg/dL (ref 60–99)
Glucose POCT: 99 mg/dL (ref 60–99)

## 2011-12-26 MED ORDER — INSULIN LISPRO (HUMAN) 100 UNIT/ML SC SOLN PEN *A*
15.0000 [IU] | Freq: Three times a day (TID) | SUBCUTANEOUS | Status: DC
Start: 2011-12-26 — End: 2011-12-26

## 2011-12-26 MED ORDER — PREDNISONE 20 MG PO TABS *I*
60.0000 mg | ORAL_TABLET | Freq: Every day | ORAL | Status: AC
Start: 2011-12-26 — End: 2011-12-27

## 2011-12-26 MED ORDER — INSULIN LISPRO (HUMAN) 100 UNIT/ML SC SOLN PEN *A*
15.0000 [IU] | Freq: Three times a day (TID) | SUBCUTANEOUS | Status: DC
Start: 2011-12-26 — End: 2012-01-17

## 2011-12-26 MED ORDER — AZITHROMYCIN 250 MG PO TABS *I*
250.0000 mg | ORAL_TABLET | Freq: Every day | ORAL | Status: AC
Start: 2011-12-26 — End: 2011-12-27

## 2011-12-26 MED ORDER — AZITHROMYCIN 250 MG PO TABS *I*
250.0000 mg | ORAL_TABLET | Freq: Every day | ORAL | Status: DC
Start: 2011-12-26 — End: 2011-12-26

## 2011-12-26 MED ORDER — PREDNISONE 20 MG PO TABS *I*
60.0000 mg | ORAL_TABLET | Freq: Every day | ORAL | Status: DC
Start: 2011-12-26 — End: 2011-12-26

## 2011-12-26 NOTE — Progress Notes (Addendum)
D/W MD and patient. Patient and MD have called [facility] and were told there are no available beds at this time. Patient she has no family/friends who can provide shelter and is aware she may be dc to homeless shelter. [facility] emergency housing checklist has been faxed to DSS with 4 scripts as requested on the checklist [phone]) and VM was left for clerk there at [phone] per Emergency housing protocol instructions.   Awaiting response from DSS.     Tanja Port, LMSW    Rec'd callback from Mr. Mayford Knife at DSS who stated they will provide shelter for patient thru the end of the month.  He also stated that she receives $800. SSI monthly.  Mr. Mayford Knife asked that patient be sent to [address] where she will "go to the middle line and tell them she receives SSI and is there for emergency housing". Mr. Mayford Knife stated she MUST arrive there prior to 3pm today.    Spoke with patient who is aware she will be dc today and must go to [address] [place] before [employer].  She said her belongings are still at [facility] and she needs her belongings. I called [facility] and spoke to Consuella Lose there who confirmed that Ms. Warne's belongings were there and she could come pick them up between 9am and 5pm during the next week.  As [facility] would not provide patient's dc transport needs the transport was put on voucher. Dc transport: [employer]  1pm pick up at main entrance of hospital to [address] ( where she  will pick up her things at [facility]) and then to [address]. [employer] where she will go to the middle line for emergency housing assignment.  Will review this with RN as well as the patient and write it down for the patient. Will ask RN to have patient's meds picked up at [facility] out[patient phcy prior to dc so meds can go with her.    Tanja Port, LMSW

## 2011-12-26 NOTE — Progress Notes (Addendum)
Discontinuing discharge order due to unsafe discharge plan.  Writer has learned while attempting to coordinate home health nursing that patient is not being discharged to emergency housing, but to DSS at [facility] to apply for emergency housing.  Patient was to be transported to [facility] to pick up belongings including medications, but is likely unable to ambulate while carrying these belongings and would require assistance.  Writer and [facility] Care Manager have contacted [employer] as potential bridge housing with adequate medical and social support for this complex and vulnerable patient with Diabetes, CAD, and s/p COPD exacerbation.  Waiting for call back from [facility] for confirmation.    [name] MD, PhD  Family Medicine, R2  12/26/2011 1:23 PM

## 2011-12-26 NOTE — Progress Notes (Signed)
Spoke with patient, she understands and agrees with discharge plan to [facility].  Spoke with social worker Jonna Munro who will coordinate transportation for patient to [address] ([facility] to assist with handling medications and belongings to cab) and from there to [address] [facility]).      [name] MD, PhD  Family Medicine, R2  12/26/2011 3:09 PM

## 2011-12-26 NOTE — Plan of Care (Signed)
Problem: Safety  Goal: Patient will remain free of falls  Outcome: Maintaining  Pt remained free of falls during shift. Uses call light appropriately. Is able to walk independently to BR and back to chair with cane. Pt has a steady gait and ambulates very well on own.     Problem: Pain/Comfort  Goal: Patient's pain or discomfort is manageable  Outcome: Progressing towards goal  Pt c/o headache and knee pain of 8/10 over night. Pt received morphine for left knee pain, was reassessed later and stated 0/10.

## 2011-12-26 NOTE — Plan of Care (Signed)
Blood glucoses 70-180 mg/dl-Pt BG is WNL Adequate for discharge      Patient will maintain adequate oxygenation_Pt O2 is 98 at RA Adequate for discharge      Patient will maintain patent airway_pt has no s/s of resp distress Adequate for discharge      Patient will achieve/maintain normal respiratory rate/effort--Pt enc to use IS, cough up sputum, look for s/s of infection in resp cavity. Adequate for discharge      Patient's pain or discomfort is manageable-pt has no c/o pain Adequate for discharge      Patient will remain free of falls-pt ambulates on her own. Adequate for discharge

## 2011-12-26 NOTE — Plan of Care (Signed)
Blood glucoses 70-180 mg/dl Progressing towards goal    Pt BG therapeutic this am. BG checked AC/HS. Evening snack given. Pt knows S/S of hyper and hypoglycemic episodes.

## 2011-12-26 NOTE — Progress Notes (Signed)
Spoke with patient and then director of [facility], they have decided to let patient stay at shelter after noticing patient trying to gather belongings as previously instructed.  Director will call [facility] to let them know not to expect patient tonight.  [employer] will need to pick up patient from [address] instead of [employer] for appointment tomorrow at 10:00 AM at [facility] for family meeting.    [name] MD, PhD  Family Medicine, R2  12/26/2011 5:20 PM

## 2011-12-26 NOTE — Progress Notes (Signed)
D/W Dr. Juliane Poot and patient is agreeable to be dc today to [facility] with stopping at [facility] on the way there to get her belongings.  RN is aware of meds on floor that need to go with patient. Left VM for Mar Daring of [facility] stating patient being dc to [facility] at [facility] and requesting that her personal care service resume tomorrow.  Voucher used for [employer] 4pm pick up in front of hospital, voucher faxed to [employer], spoke with dispatcher there who is aware of the 2 addresses they will take patient to and that she will be picking up belongings at [address]  RN and patient aware of dc time and arrangements.    Appreciate the assistance and efforts of Family Medicine.    Tanja Port, LMSW

## 2011-12-26 NOTE — Telephone Encounter (Addendum)
Writer called [employer], left voice mail message for Thalia Party r/t referring pt there d/t pt is being discharged from [facility] today; was staying at [facility] but there are no beds at this time. Transportation can be provided to take pt to [facility] to get her meds and some of her belongings and then to [facility]. Writer left contact info and requested call back as soon as message is received.    Received call back from Thalia Party; pt is all set to go to [facility] after discharge today.

## 2011-12-26 NOTE — Progress Notes (Signed)
Pt refuses to be discharged to the location on form that was to be given to the cab driver which was set up by SW at 1300. Pt upset and called friend yelling that she will not go back to that location. "I am not going there!" SW notified by charge nurse Fleet Contras of the situation. Pt's primary MD is canceling discharge at this time. Will cont to F/U with MD recommendations.Suzette Battiest RN

## 2011-12-26 NOTE — Discharge Instr - Meds (Signed)
Pt is to be discharged to [facility]. [employer] will pick pt up from [place] at 1pm. Suzette Battiest RN

## 2011-12-26 NOTE — Progress Notes (Signed)
Pt BG 77 at 8am and 99 at 11:30 am. 25 units of Humalog insulin was not given d/t  BG's. Dr. Juliane Poot was made aware. Pt is to be discharged this afternoon around 1PM.  Suzette Battiest RN

## 2011-12-26 NOTE — Interdisciplinary Rounds (Signed)
Interdisciplinary Rounds Note    Date: 12/26/2011   Time: 4:22 PM   Attendance:  Care Coordinator and Social Worker    Admit Date/Time:  12/23/2011 12:39 AM    Principal Problem: COPD with exacerbation  Problem List:   Patient Active Problem List    Diagnosis Date Noted   . Controlled substance agreement signed 12/08/2011     Priority: High     12/01/2011 Morphine 12h tabs 30mg  BID PRN.  60 tabs,  Renew on 10/28.     . Aspiration of liquid 09/26/2011     Priority: High   . Hypoglycemia, unspecified 09/20/2011     Priority: High   . DJD (degenerative joint disease) of knee, bilateral 07/21/2011     Priority: High     Family history of DJD     . Depression 11/10/2010     Priority: High     Needs PHQ at next visit  PHQ9 11/17/2011   PHQ-9 Total Score 14          . Chronic Obstructive Pulmonary Disease 12/30/2008     Priority: High     Created by Conversion       . Coronary Artery Disease 03/19/2007     Priority: High     Created by Conversion       . Type II, uncontrolled 03/19/2007     Priority: High     Insulin dependent DM II.         Marland Kitchen Insomnia 05/02/2011     Priority: Medium     Patient prescribed trazodone after hospitalization, previously on zolpidem.     . Hypertension 03/19/2007     Priority: Medium     Created by Conversion       . Pain, dental 05/30/2011     Priority: Low   . Bilateral presbyopia 05/30/2011     Priority: Low   . Dental caries 11/10/2010     Priority: Low   . Asthma 03/19/2007     Priority: Low     Created by Conversion       . COPD with exacerbation 12/23/2011   . COPD exacerbation 12/23/2011       The patient's problem list and interdisciplinary care plan was reviewed.    Discharge Planning  Lives in: Multi-level apartment  Location of bedroom: 1st level  Location of bathroom: 1st level  Lives With: Alone  Can they assist with pt needs after discharge?: Not applicable  *Does patient currently have home care services?: Yes   If yes, which agency?: [facility]  *Current External  Services: None  Current Home Equipment: None     Medical Supplies Available: Not Applicable (pt has her own cane)  Equipment/Supplies Ordered: None     *5.) Delivery of D/C DME/medical supplies confirmed, if applicable: Not Applicable    Plan    Anticipated Discharge Date:  Expected Discharge Date: 12/26/11  Discharge Disposition: [facility] with [facility]

## 2011-12-26 NOTE — Progress Notes (Signed)
Notified by Care Manager at [facility] that bed is available for patient tonight at [facility].  Called [facility] and they will coordinate getting patient's belongings on cart and assisting her with transport and handling into cab at [address] at around 3:30 PM.   Need transportation arranged to [address] and then to [facility]).    [name] MD, PhD  Family Medicine, R2  12/26/2011 2:39 PM

## 2011-12-26 NOTE — Interdisciplinary Rounds (Signed)
Interdisciplinary Rounds Note    Date: 12/26/2011   Time: 2:03 PM   Attendance:  Care Coordinator and Social Worker    Admit Date/Time:  12/23/2011 12:39 AM    Principal Problem: COPD with exacerbation  Problem List:   Patient Active Problem List    Diagnosis Date Noted   . Controlled substance agreement signed 12/08/2011     Priority: High     12/01/2011 Morphine 12h tabs 30mg  BID PRN.  60 tabs,  Renew on 10/28.     . Aspiration of liquid 09/26/2011     Priority: High   . Hypoglycemia, unspecified 09/20/2011     Priority: High   . DJD (degenerative joint disease) of knee, bilateral 07/21/2011     Priority: High     Family history of DJD     . Depression 11/10/2010     Priority: High     Needs PHQ at next visit  PHQ9 11/17/2011   PHQ-9 Total Score 14          . Chronic Obstructive Pulmonary Disease 12/30/2008     Priority: High     Created by Conversion       . Coronary Artery Disease 03/19/2007     Priority: High     Created by Conversion       . Type II, uncontrolled 03/19/2007     Priority: High     Insulin dependent DM II.         Marland Kitchen Insomnia 05/02/2011     Priority: Medium     Patient prescribed trazodone after hospitalization, previously on zolpidem.     . Hypertension 03/19/2007     Priority: Medium     Created by Conversion       . Pain, dental 05/30/2011     Priority: Low   . Bilateral presbyopia 05/30/2011     Priority: Low   . Dental caries 11/10/2010     Priority: Low   . Asthma 03/19/2007     Priority: Low     Created by Conversion       . COPD with exacerbation 12/23/2011   . COPD exacerbation 12/23/2011       The patient's problem list and interdisciplinary care plan was reviewed.    Discharge Planning  Lives in: Multi-level apartment  Location of bedroom: 1st level  Location of bathroom: 1st level  Lives With: Alone  Can they assist with pt needs after discharge?: Not applicable  *Does patient currently have home care services?: Yes   If yes, which agency?: [facility]  *Current External  Services: None  Current Home Equipment: None     Medical Supplies Available: Not Applicable (pt has her own cane)  Equipment/Supplies Ordered: None     *5.) Delivery of D/C DME/medical supplies confirmed, if applicable: Not Applicable    Plan    Anticipated Discharge Date:  Expected Discharge Date: 12/26/11  Discharge Disposition: Unable to determine at this time
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
## 2011-12-27 ENCOUNTER — Ambulatory Visit: Payer: Self-pay | Admitting: Family Medicine

## 2011-12-27 LAB — EKG 12-LEAD
P: 53 degrees
QRS: -2 degrees
Rate: 89 {beats}/min
Severity: BORDERLINE
Severity: BORDERLINE
Statement: BORDERLINE
T: 87 degrees

## 2011-12-28 ENCOUNTER — Other Ambulatory Visit: Payer: Self-pay | Admitting: Family Medicine

## 2011-12-28 DIAGNOSIS — Z79899 Other long term (current) drug therapy: Secondary | ICD-10-CM

## 2011-12-28 DIAGNOSIS — E162 Hypoglycemia, unspecified: Secondary | ICD-10-CM

## 2011-12-28 DIAGNOSIS — G4700 Insomnia, unspecified: Secondary | ICD-10-CM

## 2011-12-28 DIAGNOSIS — M1710 Unilateral primary osteoarthritis, unspecified knee: Secondary | ICD-10-CM

## 2011-12-28 DIAGNOSIS — K5900 Constipation, unspecified: Secondary | ICD-10-CM

## 2011-12-28 MED ORDER — POLYETHYLENE GLYCOL 3350 PO POWD *I*
34.0000 g | Freq: Two times a day (BID) | ORAL | Status: DC | PRN
Start: 2011-12-28 — End: 2012-03-16

## 2011-12-28 MED ORDER — MORPHINE SULFATE ER 30 MG PO TBCR *I*
60.0000 mg | ORAL_TABLET | Freq: Two times a day (BID) | ORAL | Status: DC | PRN
Start: 2011-12-28 — End: 2012-01-17

## 2011-12-28 MED ORDER — GLUCOSE 40 % PO GEL *I*
15.0000 g | Freq: Once | ORAL | Status: DC | PRN
Start: 2011-12-28 — End: 2013-01-07

## 2011-12-28 NOTE — Assessment & Plan Note (Addendum)
S/O:       Patient calling from shelter to report doing well, able to participate in physical therapy and walk more with dose increase from of morphine from 30mg  BID to 60mg  BID PRN.  Complains of constipation.    A/P:      Improved function on 60mg  BID dosing.  Increased constipation due to opiate effect, needs bowel regimen addressed.   Renew long acting morphine 60mg  BID PRN, counseled to titrate dose down or skip for days with less activity   Increase polyethylene glycol (Miralax) to 34g BID PRN, titrate to [employer] 4 stool   Track goal of increased funtion, activity, and weight loss   No iStop issues   Needs urine drug screen at next visit

## 2012-01-01 ENCOUNTER — Ambulatory Visit: Payer: Self-pay | Admitting: Family Medicine

## 2012-01-02 ENCOUNTER — Telehealth: Payer: Self-pay | Admitting: Primary Care

## 2012-01-02 NOTE — Telephone Encounter (Signed)
Hello,    Patient want to know if you will type up a letter stating she is physically unable to leave her building for health and other reasons. Her case worker will go to her appt's at DSS for her as long as she has the letter. DSS will except this. Any questions, patient can be reached at [phone]. The case worker name from the [facility] is Morrie Sheldon phone [phone] ext [phone]. She ask once the letter is completed can we fax it to her at [phone]    [place],

## 2012-01-05 NOTE — Telephone Encounter (Signed)
Letter printed and faxed as requested. 

## 2012-01-15 ENCOUNTER — Telehealth: Payer: Self-pay

## 2012-01-15 NOTE — Telephone Encounter (Signed)
[facility] opthmology appt on 04/18/12 at 1:30pm green elevators 3rd floor [phone]

## 2012-01-17 ENCOUNTER — Encounter: Payer: Self-pay | Admitting: Family Medicine

## 2012-01-17 ENCOUNTER — Ambulatory Visit: Payer: Self-pay | Admitting: Family Medicine

## 2012-01-17 VITALS — BP 147/88 | HR 91 | Temp 97.9°F | Ht 64.17 in | Wt 217.0 lb

## 2012-01-17 DIAGNOSIS — I10 Essential (primary) hypertension: Secondary | ICD-10-CM

## 2012-01-17 DIAGNOSIS — M1710 Unilateral primary osteoarthritis, unspecified knee: Secondary | ICD-10-CM

## 2012-01-17 DIAGNOSIS — Z79899 Other long term (current) drug therapy: Secondary | ICD-10-CM

## 2012-01-17 DIAGNOSIS — J441 Chronic obstructive pulmonary disease with (acute) exacerbation: Secondary | ICD-10-CM

## 2012-01-17 DIAGNOSIS — IMO0001 Reserved for inherently not codable concepts without codable children: Secondary | ICD-10-CM

## 2012-01-17 DIAGNOSIS — R51 Headache: Secondary | ICD-10-CM

## 2012-01-17 MED ORDER — LOSARTAN POTASSIUM 50 MG PO TABS *I*
50.0000 mg | ORAL_TABLET | Freq: Every day | ORAL | Status: DC
Start: 2012-01-17 — End: 2012-01-31

## 2012-01-17 MED ORDER — AMOXICILLIN-POT CLAVULANATE 875-125 MG PO TABS *I*
1.0000 | ORAL_TABLET | Freq: Two times a day (BID) | ORAL | Status: DC
Start: 2012-01-17 — End: 2012-02-11

## 2012-01-17 MED ORDER — PREDNISONE 20 MG PO TABS *I*
40.0000 mg | ORAL_TABLET | Freq: Every day | ORAL | Status: DC
Start: 2012-01-17 — End: 2012-01-31

## 2012-01-17 MED ORDER — ATORVASTATIN CALCIUM 10 MG PO TABS *I*
10.0000 mg | ORAL_TABLET | Freq: Every day | ORAL | Status: DC
Start: 2012-01-17 — End: 2012-01-31

## 2012-01-17 MED ORDER — ONETOUCH ULTRA 2 W/DEVICE KIT *A*
PACK | Status: DC
Start: 2012-01-17 — End: 2012-07-17

## 2012-01-17 MED ORDER — MORPHINE SULFATE ER 30 MG PO TBCR *I*
60.0000 mg | ORAL_TABLET | Freq: Two times a day (BID) | ORAL | Status: DC | PRN
Start: 2012-01-17 — End: 2012-01-31

## 2012-01-17 NOTE — Progress Notes (Signed)
[facility] Family Medicine - Outpatient Progress Note    SUBJECTIVE    Gloria Ray is a 55 y.o. female who presents for URI and Headache    URI: Nasal congestion, copious facial pressure and yellow-green nasal discharge over the last week or so, since finishing antibiotics and steroids from recent COPD exacerbation hospitalization. Had been coughing less, but now has increased.    DM2: needs more lantus. Just ran out. No highs or lows that she remembers    COPD: Completed course on discharge of azithro and prednisone, then started wheezing and coughing again. Has actually been coughing more for the last few months. Some DOE. Bringing up more phlegm. Subjective fevers.    HTN: Not currently taking losartan as she didn't get it on d/c from hospital, or lost it. No CP, DOE as above. No LE edema    Headache: Feels like presure, right-sided    Chronic pain: Needs more morphine. Ran out as scheduled yesterday.     Past Medical, Family and Social History  Patient's medications, allergies, past medical, surgical, social and family histories were updated and marked as reviewed, as appropriate. Pt never went to [employer] been living at [facility].     Review of Systems   Constitutional: Positive for fever and chills.   Respiratory: as above.    Cardiovascular: Negative for chest pain.   Gastrointestinal: Negative for abdominal pain.     OBJECTIVE    BP 147/88  Pulse 91  Temp(Src) 36.6 C (97.9 F)  Ht 1.63 m (5' 4.17")  Wt 98.431 kg (217 lb)  BMI 37.05 kg/m2  SpO2 95%    VITALS: reviewed  GEN: Pleasant and cooperative, fatigued  Lungs with diffuse wheeze, poor air movement  RRR, no m/r/g  HEENT: TMs somewhat retracted. Frontal and maxillary sinuses TTP. Nasal mucosa with significant discharge and drainage.     ASSESSMENT & PLAN    1. COPD with exacerbation  More prednisone. Augmentin for bronchitis +/- sinusitis.    2. Hypertension, uncontrolled Refill losartan   3. Type II DM, uncontrolled  Refill lantus   4.  Headache  Work on sinus pressure.    5. DJD (degenerative joint disease) of knee, bilateral  morphine (MS CONTIN) 30 MG 12 hr tablet refilled            Health Maintenance  Health Maintenance   Topic Date Due   . Foot Exam  12/31/1966   . Ophthalmology Exam  12/31/1966   . Hiv Testing Offered  12/30/1969   . Hemoglobin A1c  11/29/2011   . Urine Microalbumin  03/26/2012   . Lipid Disorder Screening  09/20/2012   . Imm-influenza 9 Y + Up  11/04/2012       F/u as scheduled in 2 weeks or prn.

## 2012-01-24 ENCOUNTER — Telehealth: Payer: Self-pay | Admitting: Family Medicine

## 2012-01-24 DIAGNOSIS — IMO0001 Reserved for inherently not codable concepts without codable children: Secondary | ICD-10-CM

## 2012-01-24 MED ORDER — GLUCOSE BLOOD VI STRP *A*
ORAL_STRIP | Status: DC
Start: 2012-01-24 — End: 2012-07-17

## 2012-01-24 NOTE — Telephone Encounter (Signed)
Gloria Ray needs to have a prescription for True Test, Test strips sent to [facility]

## 2012-01-24 NOTE — Telephone Encounter (Signed)
Rx for True Test test strips printed and sent to [facility] pharmacy.    [name] MD, PhD  Family Medicine, R2  01/24/2012 4:44 PM

## 2012-01-31 ENCOUNTER — Encounter: Payer: Self-pay | Admitting: Family Medicine

## 2012-01-31 ENCOUNTER — Ambulatory Visit: Payer: Self-pay | Admitting: Family Medicine

## 2012-01-31 VITALS — BP 176/84 | HR 104 | Ht 64.17 in | Wt 223.0 lb

## 2012-01-31 DIAGNOSIS — IMO0001 Reserved for inherently not codable concepts without codable children: Secondary | ICD-10-CM

## 2012-01-31 DIAGNOSIS — G4700 Insomnia, unspecified: Secondary | ICD-10-CM

## 2012-01-31 DIAGNOSIS — I10 Essential (primary) hypertension: Secondary | ICD-10-CM

## 2012-01-31 DIAGNOSIS — J329 Chronic sinusitis, unspecified: Secondary | ICD-10-CM

## 2012-01-31 DIAGNOSIS — M1710 Unilateral primary osteoarthritis, unspecified knee: Secondary | ICD-10-CM

## 2012-01-31 DIAGNOSIS — Z79899 Other long term (current) drug therapy: Secondary | ICD-10-CM

## 2012-01-31 DIAGNOSIS — J449 Chronic obstructive pulmonary disease, unspecified: Secondary | ICD-10-CM

## 2012-01-31 MED ORDER — AZITHROMYCIN 250 MG PO TABS *I*
ORAL_TABLET | ORAL | Status: AC
Start: 2012-01-31 — End: 2012-02-05

## 2012-01-31 MED ORDER — LOSARTAN POTASSIUM 50 MG PO TABS *I*
100.0000 mg | ORAL_TABLET | Freq: Every day | ORAL | Status: DC
Start: 2012-01-31 — End: 2012-05-08

## 2012-01-31 MED ORDER — BUDESONIDE-FORMOTEROL FUMARATE 160-4.5 MCG/ACT IN AERO *I*
2.0000 | INHALATION_SPRAY | Freq: Two times a day (BID) | RESPIRATORY_TRACT | Status: DC
Start: 2012-01-31 — End: 2012-02-21

## 2012-01-31 MED ORDER — ALBUTEROL SULFATE (2.5 MG/3ML) 0.083% IN NEBU *I*
2.5000 mg | INHALATION_SOLUTION | RESPIRATORY_TRACT | Status: DC | PRN
Start: 2012-01-31 — End: 2012-03-01

## 2012-01-31 MED ORDER — ATORVASTATIN CALCIUM 10 MG PO TABS *I*
10.0000 mg | ORAL_TABLET | Freq: Every day | ORAL | Status: DC
Start: 2012-01-31 — End: 2012-03-12

## 2012-01-31 MED ORDER — MORPHINE SULFATE ER 30 MG PO TBCR *I*
30.0000 mg | ORAL_TABLET | Freq: Three times a day (TID) | ORAL | Status: DC | PRN
Start: 2012-01-31 — End: 2012-02-21

## 2012-01-31 MED ORDER — QUETIAPINE FUMARATE 25 MG PO TABS *I*
50.0000 mg | ORAL_TABLET | Freq: Every evening | ORAL | Status: DC
Start: 2012-01-31 — End: 2012-02-08

## 2012-01-31 NOTE — Progress Notes (Signed)
Assessment & Plan        Gloria Ray is a 56 y.o. female and  has a past medical history of Asthma; COPD (chronic obstructive pulmonary disease); Coronary artery disease; Diabetes mellitus; Hypertension; Ischemic Stroke (03/06/2006); and Prior Myocardial Infarction (03/19/2007).       The patient evaluated today for: Knee Pain      Chronic Obstructive Pulmonary Disease      COPD exacerbations for year: 03/2011; 12/2011.  No recent PFTs, likely GOLD 2 or 3.  mMRC likely 3, CAT likely >21.  Former smoker, in remission.  No O2 requirement.     Repeat azithromycin Z-pack for sinusitis   Continue budesonide-formoterol (Symbicort) 160-4.5 BID   Duoneb PRN for exacerbation, call MD when using   Consider home prednisone for exacerbations   Complete mMRC and CAT at next visit   Refer to Pulmonology for PFTs and recommendations     Type II, uncontrolled      Uncontrolled, due for lab work, in need of vaccinations.  Hx of hypoglycemia due to lack of food.   Continue Lantus 80 Units qhs, likely will need increase, 1/2 dose for fasting or low food intake.   Continue Humalog 30 Units TID with meals, hold for not eating or reduced carb meals   Labs ordered   Vaccinations ordered   F/u q94mo for diabetes care, consider diabetes support group   Consider monthly assessment by care management    DJD (degenerative joint disease) of knee, bilateral      Documented DJD, refractory to corticosteroid injection.   Need Ortho input, needs improved diabetes and pulmonary function to improve surgical candidacy   Continue long acting morphine for function.  Renal function limits utility of NSAIDs.    Insomnia      Chronic. Likely component of uncertain psychosocial situation, currently living in shelter ([facility]).  SNRI may be contributing.   Trial of seroquel as adjuvant for depression and utilizing sedating side effect   Will trial zolpidem for short term relief if ineffective   Consider OSA and sleep study     Hypertension       Uncontrolled.  With comorbid DM, obesity.   Increase losartan to 50mg  daily (from 25mg  daily)   Consider OSA, sleep study    Orders this visit:  Chronic Obstructive Pulmonary Disease  - budesonide-formoterol (SYMBICORT) 160-4.5 MCG/ACT inhaler; Inhale 2 puffs into the lungs 2 times daily  - albuterol (PROVENTIL) (2.5 mg/28mL) 0.083% nebulizer solution; Take 3 mLs (2.5 mg total) by nebulization every 4 hours as needed for Wheezing or Shortness of Breath  - CBC and differential; Future    Type II, uncontrolled  - atorvastatin (LIPITOR) 10 MG tablet; Take 1 tablet (10 mg total) by mouth daily (with dinner)  - Comprehensive metabolic panel; Future  - Microalbumin, Urine, Random; Future  - Hepatitis B vaccine adult IM  (AMBULATORY USE ONLY)  - Lipid panel; Future  - AMB REFERRAL TO OPHTHALMOLOGY  - Hemoglobin A1c; Future    DJD (degenerative joint disease) of knee, bilateral  - morphine (MS CONTIN) 30 MG 12 hr tablet; Take 1 tablet (30 mg total) by mouth 3 times daily as needed for Severe Pain   MDD 90mg .  30 day supply.    Controlled substance agreement signed  - morphine (MS CONTIN) 30 MG 12 hr tablet; Take 1 tablet (30 mg total) by mouth 3 times daily as needed for Severe Pain   MDD 90mg .  30 day supply.  Insomnia  - QUEtiapine (SEROQUEL) 25 MG tablet; Take 2 tablets (50 mg total) by mouth nightly    Sinusitis  - azithromycin (ZITHROMAX) 250 MG tablet; Take 2 tablets (500 mg) on day 1, followed by 1 tablet (250 mg) on days 2 through 5.    Hypertension  - losartan (COZAAR) 50 MG tablet; Take 2 tablets (100 mg total) by mouth daily    Other Orders  - zolpidem (AMBIEN) 5 MG tablet; Take 1 tablet (5 mg total) by mouth nightly as needed for Sleep   MDD 5 mg       Subjective        Chief Complaint: Knee Pain    HPI     Chronic Obstructive Pulmonary Disease       S/p hospitalization 10/19 for COPD exacerbation, living at shelter ([facility]) with delay in obtaining medications and environmental exacerbators (perfume etc).  Was  planning to live with daughters in new apartment but this has not worked out.  Has needed medications including nebulizer and Duonebs for home treatment of exacerbation.  Recently treated with  for sinus infection and continued COPD exacerbation sx (11/13, see Dr. Theron Arista note for details), given prednisone and Augmentin.  Continues to have some sinus pressure and symptoms with exudate, getting better.    Type II, uncontrolled       Diabetes health maintenance reviewed.  Due for laboratory testing.  Recently on prednisone for COPD exacerbation.  Patient reports taking Lantus 80 Units qhs and Humalog 30 Units with meals as prescribed with good adherence.  No adverse medication effects at this time.  Is checking blood glucose outside of the office, does not have meter today.    DJD (degenerative joint disease) of knee, bilateral       Functioning well on 30mg  long acting morphine, limited more by COPD than pain at this time.    Insomnia       Previously on Trazodone 300mg  qhs, was initially effective and titrated up but was not working even at high dose.  Self increased beyond 300mg  and experienced palpitations.  Discontinued trazodone due to cardiac concern and titrating up duloxetine used to manage depression and neuropathic pain.  Has not slept well in days, and this is very bothersome to patient.    Hypertension       BP trend is elevated.  Denies headache, vision changes, chest pain, palpitations, SOB, dizziness, loss of consciousness or loss of motor function.      Pertinent positives and negatives of a 12-point review of systems noted above.       Objective   Recorded Vital Signs:  BP 176/84  Pulse 104  Ht 1.63 m (5' 4.17")  Wt 101.152 kg (223 lb)  BMI 38.07 kg/m2   Physical Exam   VITALS: Reviewed above and hypertensive.  BMI category is moderate obesity.  GEN: Well developed, well nourished, appears stated age, no acute distress  HEENT: Atraumatic.  EOM normal, conjunctiva clear.   PULM: Lungs are clear to  auscultation bilaterally with good effort, moderate airflow.  No crackles, rales, or wheezes appreciated.  CV: Regular rate, S1, S2.  No S3, S4, murmurs or rubs.   EXT: Warm and well perfused.  No edema.  Radial and dorsalis pedis pulses palpable.  SKIN: Warm and dry. No rashes, ecchymoses, or petechiae.  NEURO:  Alert.  Cranial nerves grossly intact.  Strength is normal and symmetric.  Gait is abnormal, slow.  Balance is normal.  Note in APSO format, see top for Assessment & Plan.     [name] MD, PhD   [facility]   01/31/2012  7:21 PM       I saw and evaluated the patient. I agree with the resident's/fellow's findings and plan of care as documented above.  S:  1) Diabetes, uncontrolled 2) HTN, uncontrolled 3) COPD, improved and needs RF on meds today 4) Sinusitis, not completely improved s/p 1st line treatment 5) Insomnia, uncontrolled 6) Depression, improved   O:  PHQ-9: 1, obese female in NAD  A/P:  1) Check HgA1C and FLP. Check micral and referral to optho  2) CMP and increase meds as above 3) CCM and RF meds 4) Azithromycin as above 5) Seroquel for sleep 6) CCM.  F/U 2 weeks,   Terri Skains, MD

## 2012-02-08 MED ORDER — ZOLPIDEM TARTRATE 5 MG PO TABS *I*
5.0000 mg | ORAL_TABLET | Freq: Every evening | ORAL | Status: DC | PRN
Start: 2012-02-08 — End: 2012-02-16

## 2012-02-11 ENCOUNTER — Other Ambulatory Visit: Payer: Self-pay | Admitting: Gastroenterology

## 2012-02-11 ENCOUNTER — Emergency Department: Admit: 2012-02-11 | Disposition: A | Discharge status: Home / Self Care | Payer: Self-pay | Source: Ambulatory Visit

## 2012-02-11 ENCOUNTER — Encounter: Payer: Self-pay | Admitting: Geriatric Medicine

## 2012-02-11 MED ORDER — IPRATROPIUM-ALBUTEROL 0.5-2.5 MG/3ML IN SOLN *I*
3.0000 mL | Freq: Once | RESPIRATORY_TRACT | Status: AC
Start: 2012-02-11 — End: 2012-02-11

## 2012-02-11 MED ORDER — IPRATROPIUM-ALBUTEROL 0.5-2.5 MG/3ML IN SOLN *I*
RESPIRATORY_TRACT | Status: AC
Start: 2012-02-11 — End: 2012-02-11
  Administered 2012-02-11: 3 mL via RESPIRATORY_TRACT
  Filled 2012-02-11: qty 3

## 2012-02-11 MED ORDER — ACETAMINOPHEN 325 MG PO TABS *I*
650.0000 mg | ORAL_TABLET | Freq: Once | ORAL | Status: AC
Start: 2012-02-11 — End: 2012-02-11
  Administered 2012-02-11: 650 mg via ORAL
  Filled 2012-02-11: qty 2

## 2012-02-11 MED ORDER — GUAIFENESIN 600 MG PO TB12 *I*
600.0000 mg | ORAL_TABLET | Freq: Two times a day (BID) | ORAL | Status: AC
Start: 2012-02-11 — End: 2012-03-12
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
## 2012-02-11 NOTE — Discharge Instructions (Signed)
Plenty of fluids  See MD next week  Use inhaler as needed

## 2012-02-11 NOTE — ED Notes (Signed)
[facility], c/o feeling SOB and nauseous all day.  Used neb treatment 8-10 times today with no relief, and reports BLE edema.  Pt denies CP, reports 10/10 HA pain.  Pt states her vision is more blurry

## 2012-02-11 NOTE — ED Notes (Signed)
Yawning frequently. Reports increased use of nebulizer without relief in SOB. States worsening DOE. Reports BLE edema that improved yesterday. States recently started on medication to help her sleep, unsure of what it is, states it is not working. Patient also reports mild CP. Endorses lightheadedness/ dizziness "when I first stand up."

## 2012-02-11 NOTE — ED Provider Notes (Signed)
History     Chief Complaint   Patient presents with   . Shortness of Breath     HPI Comments: Presents with complaint of wheezing  Used inhaler today several times  No fever  occ cough  No chest pain or leg swelling  No dizziness or weakness    Patient is a 56 y.o. female presenting with shortness of breath.   History provided by:  Patient  Language interpreter used: Yes    Shortness of Breath  Severity:  Moderate  Onset quality:  Gradual  Duration:  1 day  Timing:  Constant  Progression:  Unchanged  Context: activity and URI (was treated with Augmentin 11/13-- treated course of antibiotics)    Context: not animal exposure, not emotional upset, not fumes, not known allergens, not occupational exposure, not pollens, not smoke exposure, not strong odors and not weather changes    Relieved by:  Inhaler  Ineffective treatments:  Inhaler  Associated symptoms: cough and wheezing    Associated symptoms: no abdominal pain, no chest pain, no claudication, no diaphoresis, no ear pain, no fever, no headaches, no hemoptysis, no neck pain, no PND, no rash, no sore throat, no sputum production, no syncope, no swollen glands and no vomiting        Past Medical History   Diagnosis Date   . Asthma    . COPD (chronic obstructive pulmonary disease)    . Coronary artery disease    . Diabetes mellitus    . Hypertension    . Ischemic Stroke 03/06/2006     Created by Conversion    . Prior Myocardial Infarction 03/19/2007     Created by Conversion             Past Surgical History   Procedure Laterality Date   . Coronary angioplasty with stent placement     . Bunionectomy         Family History   Problem Relation Age of Onset   . Diabetes Other    . Heart disease Other    . Hypertension Other    . Coronary art dis Other    . Hypertension Mother    . Diabetes Mother    . Alcohol abuse Father    . Other Father      cirrhosis   . Arthritis Sister    . Substance abuse Sister    . Other Brother      HIV   . Substance abuse Brother    .  Diabetes Brother          Social History      reports that she quit smoking about 16 months ago. Her smoking use included Cigarettes. She smoked 0.00 packs per day. She has never used smokeless tobacco. She reports that she does not drink alcohol or use illicit drugs. His sexual activity history not on file.    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   HENT: Negative for ear pain, congestion, sore throat, rhinorrhea, neck pain and postnasal drip.  Respiratory: Positive for cough, shortness of breath and wheezing. Negative for hemoptysis and sputum production.    Cardiovascular: Negative for chest pain, claudication, syncope and PND.   Gastrointestinal: Negative for vomiting and abdominal pain.   Musculoskeletal: Negative.    Skin: Negative for rash.   Neurological: Negative for headaches.   Hematological: Negative.    Psychiatric/Behavioral: Negative.        Physical Exam     ED Triage Vitals   BP Heart Rate Heart Rate(via Pulse Ox) Resp Temp Temp src SpO2 O2 Device O2 Flow Rate   02/11/12 1715 02/11/12 1715 -- 02/11/12 1715 02/11/12 1715 -- 02/11/12 1715 02/11/12 1715 --   164/99 mmHg 99  24 35.8 C (96.4 F)  99 % None (Room air)       Weight           02/11/12 1715           95.255 kg (210 lb)               Physical Exam   Nursing note and vitals reviewed.  Constitutional: She is oriented to person, place, and time. She appears well-developed and well-nourished.   HENT:   Head: Normocephalic and atraumatic.   Right Ear: External ear normal.   Left Ear: External ear normal.   Nose: Nose normal.   Mouth/Throat: Oropharynx is clear and moist.   Eyes: Conjunctivae and EOM are normal.  Pupils are equal, round, and reactive to light.   Neck: Normal range of motion. Neck supple. No thyromegaly present.   Cardiovascular: Normal rate, regular rhythm, normal heart sounds and intact distal pulses.    Pulmonary/Chest: Effort normal. No respiratory distress. She has wheezes. She has no rales. She exhibits no tenderness.   Scant exp wheeze at base   Abdominal: Soft. Bowel sounds are normal.   Musculoskeletal: Normal range of motion.   Neurological: She is alert and oriented to person, place, and time.   Skin: Skin is warm and dry.   Psychiatric: She has a normal mood and affect. Her behavior is normal. Judgment and thought content normal.       Medical Decision Making      Amount and/or Complexity of Data Reviewed  Tests in the radiology section of CPT: ordered and reviewed        Initial Evaluation:  ED First Provider Contact    Date/Time Event User Comments    02/11/12 1811 ED Provider First Contact Brook Geraci, Linward Natal Initial Face to Face Provider Contact          Patient seen by me today 02/11/2012 at at time of arrival  5:05 PM.  Initial face to face evaluation time noted above may be discrepant due to patient acuity and delay in documentation.    Assessment:  56 y.o., female comes to the ED with cough, wheezing    Differential Diagnosis includes pna, asthma exacerbation, viral, bronchitis             Plan: cxr-- negative, duoneb  cta post neb  Will dc with mucinex        Linward Natal Talley Kreiser, PA    Fannie Knee, [place]  02/11/12 2004

## 2012-02-13 LAB — EKG 12-LEAD
P: 66 degrees
QRS: 0 degrees
Rate: 96 {beats}/min
Severity: ABNORMAL
Severity: ABNORMAL
Statement: ABNORMAL
T: 102 degrees

## 2012-02-13 NOTE — Assessment & Plan Note (Addendum)
S/O:       S/p hospitalization 10/19 for COPD exacerbation, living at shelter ([facility]) with delay in obtaining medications and environmental exacerbators (perfume etc).  Was planning to live with daughters in new apartment but this has not worked out.  Has needed medications including nebulizer and Duonebs for home treatment of exacerbation.  Recently treated with  for sinus infection and continued COPD exacerbation sx (11/13, see Dr. Theron Arista note for details), given prednisone and Augmentin.  Continues to have some sinus pressure and symptoms with exudate, getting better.  A/P:      COPD exacerbations for year: 03/2011; 12/2011.  No recent PFTs, likely GOLD 2 or 3.  mMRC likely 3, CAT likely >21.  Former smoker, in remission.  No O2 requirement.     Repeat azithromycin Z-pack for sinusitis   Continue budesonide-formoterol (Symbicort) 160-4.5 BID   Duoneb PRN for exacerbation, call MD when using   Consider home prednisone for exacerbations   Complete mMRC and CAT at next visit   Refer to Pulmonology for PFTs and recommendations

## 2012-02-14 ENCOUNTER — Ambulatory Visit: Payer: Self-pay | Admitting: Family Medicine

## 2012-02-14 NOTE — Assessment & Plan Note (Signed)
S/O:       Previously on Trazodone 300mg  qhs, was initially effective and titrated up but was not working even at high dose.  Self increased beyond 300mg  and experienced palpitations.  Discontinued trazodone due to cardiac concern and titrating up duloxetine used to manage depression and neuropathic pain.  Has not slept well in days, and this is very bothersome to patient.  A/P:      Chronic. Likely component of uncertain psychosocial situation, currently living in shelter ([facility]).  SNRI may be contributing.   Trial of seroquel as adjuvant for depression and utilizing sedating side effect   Will trial zolpidem for short term relief if ineffective   Consider OSA and sleep study

## 2012-02-14 NOTE — Assessment & Plan Note (Addendum)
S/O:       BP trend is elevated.  Denies headache, vision changes, chest pain, palpitations, SOB, dizziness, loss of consciousness or loss of motor function.  A/P:      Uncontrolled.  With comorbid DM, obesity.   Increase losartan to 50mg  daily (from 25mg  daily)   Consider OSA, sleep study

## 2012-02-14 NOTE — Assessment & Plan Note (Signed)
S/O:       Functioning well on 30mg  long acting morphine, limited more by COPD than pain at this time.  A/P:      Documented DJD, refractory to corticosteroid injection.   Need Ortho input, needs improved diabetes and pulmonary function to improve surgical candidacy   Continue long acting morphine for function.  Renal function limits utility of NSAIDs.

## 2012-02-14 NOTE — Assessment & Plan Note (Addendum)
S/O:       Diabetes health maintenance reviewed.  Due for laboratory testing.  Recently on prednisone for COPD exacerbation.  Patient reports taking Lantus 80 Units qhs and Humalog 30 Units with meals as prescribed with good adherence.  No adverse medication effects at this time.  Is checking blood glucose outside of the office, does not have meter today.  A/P:      Uncontrolled, due for lab work, in need of vaccinations.  Hx of hypoglycemia due to lack of food.   Continue Lantus 80 Units qhs, likely will need increase, 1/2 dose for fasting or low food intake.   Continue Humalog 30 Units TID with meals, hold for not eating or reduced carb meals   Labs ordered   Vaccinations ordered   F/u q44mo for diabetes care, consider diabetes support group   Consider monthly assessment by care management

## 2012-02-15 ENCOUNTER — Other Ambulatory Visit: Payer: Self-pay | Admitting: Family Medicine

## 2012-02-15 ENCOUNTER — Telehealth: Payer: Self-pay | Admitting: Primary Care

## 2012-02-15 NOTE — Telephone Encounter (Signed)
Hello,    Patient want you to call her in some benadryl and her sleep medicine, not sure of the name    Thanks,

## 2012-02-16 ENCOUNTER — Telehealth: Payer: Self-pay

## 2012-02-16 ENCOUNTER — Emergency Department
Admission: EM | Admit: 2012-02-16 | Disposition: A | Discharge status: Home / Self Care | Payer: Self-pay | Source: Ambulatory Visit

## 2012-02-16 DIAGNOSIS — G4700 Insomnia, unspecified: Secondary | ICD-10-CM

## 2012-02-16 MED ORDER — DIPHENHYDRAMINE HCL 50 MG ORAL SOLID *WRAPPED*
50.0000 mg | Freq: Every evening | ORAL | Status: DC | PRN
Start: 2012-02-16 — End: 2012-02-21

## 2012-02-16 MED ORDER — ZOLPIDEM TARTRATE 5 MG PO TABS *I*
5.0000 mg | ORAL_TABLET | Freq: Every evening | ORAL | Status: DC | PRN
Start: 2012-02-16 — End: 2012-03-07

## 2012-02-16 NOTE — Telephone Encounter (Signed)
Patient needs ambien and benadryl
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
## 2012-02-16 NOTE — ED Notes (Signed)
[facility], having panic attacks today. Patient currently pain free, s/s free. Has been non-compliant with medications. Shelter requires psych clearance prior to returning.

## 2012-02-16 NOTE — Discharge Instructions (Signed)
[facility] Discharge Instructions    Discharge Date: 02/16/2012    Discharge Time:   8:30 pm    Follow-ups:  Appointment With: Please call your primary care physician for an appointment    Level of outreach indicated if patient fails new intake or COPS (Comprehensive Outpatient Psychiatric Service) appointment: Routine Program Follow-up    When to call for help:    Call your psychiatric outpatient provider if experiencing any of these symptoms: increased irritability, sleep changes, appetite changes, energy changes, thoughts to harm yourself or others, anxiety, fear, auditory or visual hallucinations.  [facility] Helpl[facility] (24 hours/7 days) [phone] Consulting civil engineer)  Mobile Crisis team: [phone]    General Instructions:  Other written information given to the patient: No            The above information has been discussed with me and I have received a copy.  I understand that I am advised to follow the instructions given to me to appropriately care for my condition.
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
## 2012-02-21 ENCOUNTER — Ambulatory Visit: Payer: Self-pay | Admitting: Family Medicine

## 2012-02-21 ENCOUNTER — Encounter: Payer: Self-pay | Admitting: Family Medicine

## 2012-02-21 VITALS — BP 144/90 | HR 100 | Temp 97.9°F | Ht 64.17 in | Wt 210.0 lb

## 2012-02-21 DIAGNOSIS — F32A Depression, unspecified: Secondary | ICD-10-CM

## 2012-02-21 DIAGNOSIS — J449 Chronic obstructive pulmonary disease, unspecified: Secondary | ICD-10-CM

## 2012-02-21 DIAGNOSIS — Z79899 Other long term (current) drug therapy: Secondary | ICD-10-CM

## 2012-02-21 DIAGNOSIS — M1710 Unilateral primary osteoarthritis, unspecified knee: Secondary | ICD-10-CM

## 2012-02-21 DIAGNOSIS — F419 Anxiety disorder, unspecified: Secondary | ICD-10-CM

## 2012-02-21 DIAGNOSIS — R112 Nausea with vomiting, unspecified: Secondary | ICD-10-CM

## 2012-02-21 MED ORDER — PROMETHAZINE HCL 25 MG PO TABS *I*
25.0000 mg | ORAL_TABLET | ORAL | Status: DC | PRN
Start: 2012-02-21 — End: 2012-03-07

## 2012-02-21 MED ORDER — BUSPIRONE HCL 10 MG PO TABS *I*
10.0000 mg | ORAL_TABLET | Freq: Two times a day (BID) | ORAL | Status: DC
Start: 2012-02-21 — End: 2012-03-16

## 2012-02-21 MED ORDER — DULOXETINE HCL 60 MG PO CPEP *I*
60.0000 mg | DELAYED_RELEASE_CAPSULE | Freq: Every day | ORAL | Status: DC
Start: 2012-02-21 — End: 2012-02-21

## 2012-02-21 MED ORDER — MORPHINE SULFATE ER 30 MG PO TBCR *I*
30.0000 mg | ORAL_TABLET | Freq: Two times a day (BID) | ORAL | Status: AC
Start: 2012-02-21 — End: 2012-03-22

## 2012-02-21 MED ORDER — SALINE NASAL SPRAY 0.65 % NA SOLN *WRAPPED*
1.0000 | NASAL | Status: DC | PRN
Start: 2012-02-21 — End: 2012-03-07

## 2012-02-21 MED ORDER — BUDESONIDE-FORMOTEROL FUMARATE 160-4.5 MCG/ACT IN AERO *I*
2.0000 | INHALATION_SPRAY | Freq: Two times a day (BID) | RESPIRATORY_TRACT | Status: DC
Start: 2012-02-21 — End: 2013-06-24

## 2012-02-21 MED ORDER — DULOXETINE HCL 60 MG PO CPEP *I*
60.0000 mg | DELAYED_RELEASE_CAPSULE | Freq: Every day | ORAL | Status: DC
Start: 2012-02-21 — End: 2012-02-26

## 2012-02-21 NOTE — Progress Notes (Signed)
Assessment & Plan        Gloria Ray is a 56 y.o. female and  has a past medical history of Asthma; COPD (chronic obstructive pulmonary disease); Coronary artery disease; Diabetes mellitus; Hypertension; Ischemic Stroke (03/06/2006); Prior Myocardial Infarction (03/19/2007); Unspecified cerebral artery occlusion with cerebral infarction; and Arthritis.       The patient evaluated today for: Diarrhea and Weight Loss      Anxiety and depression      Increased anxiety and panic symptoms   D/c bupropion   Continue duloxetine   Trial adding buspirone for anxiety     Chronic Obstructive Pulmonary Disease      Stable.   Refill symbicort     Nausea & vomiting      Long time course for viral syndrome.  Consider serotonergic excess.   Promethazine for nausea PRN    Other orders:  DJD (degenerative joint disease) of knee, bilateral       Refill, controlled substance agreement signed.  - morphine (MS CONTIN) 30 MG 12 hr tablet; Take 1 tablet (30 mg total) by mouth 2 times daily   MDD 60mg .  30 day supply.       Subjective        Chief Complaint: Diarrhea and Weight Loss    HPI     Anxiety and depression       Having panic attacks.  Still taking bupropion and duloxetine.      Chronic Obstructive Pulmonary Disease       Needs refill of symbicort.  Breathing is all right.  Anxiety is the only thing affecting her breathing    Nausea & vomiting       Has been going on for 2-3 weeks.  Denies fevers.  Diarrhea and emesis.  Can only eat crackers.  Reports losing 20 lbs.  Sometimes misses medications or vomits them up.  Feels light sensitive, wakes every morning with headache.  Insomnia for about same amount of time.      Pertinent positives and negatives of a 12-point review of systems noted above.       Objective   Recorded Vital Signs:  BP 144/90  Pulse 100  Temp(Src) 36.6 C (97.9 F) (Temporal)  Ht 1.63 m (5' 4.17")  Wt 95.255 kg (210 lb)  BMI 35.85 kg/m2  Wt Readings from Last 5 Encounters:   02/21/12 95.255 kg (210  lb)   02/16/12 98.884 kg (218 lb)   02/11/12 95.255 kg (210 lb)   01/31/12 101.152 kg (223 lb)   01/17/12 98.431 kg (217 lb)        Physical Exam   VITALS: Reviewed above and hypertensive.  BMI category is moderate obesity.  Weight down ~10lbs.  GEN: Well developed, well nourished, appears older than stated age, no acute distress  HEENT: Atraumatic.  EOM normal, conjunctiva clear.  PULM: Lungs are clear to auscultation bilaterally with good effort.  No crackles, rales, or wheezes appreciated.  CV: Regular rate, S1, S2.  No S3, S4, murmurs or rubs.  EXT: Warm and well perfused.  No edema.  SKIN: Warm and dry. No rashes, ecchymoses, or petechiae.  NEURO:  Alert.  Cranial nerves grossly intact.  Strength is normal and symmetric.  Gait is slow, halting.  Balance is normal.       Note in APSO format, see top for Assessment & Plan.     [name] MD, PhD   R2 [facility]   02/21/2012  2:35 PM       --------------------  Naval architect - Patient Seen    I saw and evaluated the patient. I agree with the resident's/fellow's findings and plan of care as documented above.       Laurena Spies, MD

## 2012-02-21 NOTE — Assessment & Plan Note (Signed)
S/O:       Needs refill of symbicort.  Breathing is all right.  Anxiety is the only thing affecting her breathing  A/P:      Stable.   Refill symbicort

## 2012-02-21 NOTE — Assessment & Plan Note (Signed)
S/O:       Having panic attacks.  Still taking bupropion and duloxetine.    A/P:      Increased anxiety and panic symptoms   D/c bupropion   Continue duloxetine   Trial adding buspirone for anxiety

## 2012-02-21 NOTE — Assessment & Plan Note (Signed)
S/O:       Has been going on for 2-3 weeks.  Denies fevers.  Diarrhea and emesis.  Can only eat crackers.  Reports losing 20 lbs.  Sometimes misses medications or vomits them up.  Feels light sensitive, wakes every morning with headache.  Insomnia for about same amount of time.  A/P:      Long time course for viral syndrome.  Consider serotonergic excess.   Promethazine for nausea PRN

## 2012-02-26 ENCOUNTER — Telehealth: Payer: Self-pay | Admitting: Family Medicine

## 2012-02-26 DIAGNOSIS — F32A Depression, unspecified: Secondary | ICD-10-CM

## 2012-02-26 DIAGNOSIS — G4700 Insomnia, unspecified: Secondary | ICD-10-CM

## 2012-02-26 DIAGNOSIS — F419 Anxiety disorder, unspecified: Secondary | ICD-10-CM

## 2012-02-26 MED ORDER — TRAZODONE HCL 100 MG PO TABS *I*
100.0000 mg | ORAL_TABLET | Freq: Every evening | ORAL | Status: DC
Start: 2012-02-26 — End: 2012-03-07

## 2012-02-26 NOTE — Telephone Encounter (Signed)
Patient reporting little sleep for past 2 days, now zolpidem not working.  No longer taking bupropion.  Had discussed taking off duloxetine, going back on trazodone, patient would like to try this plan.  Also would like to come in to review medications, what they are for, and do medication reconciliation with goal of minimizing medications.    Plan: stop duloxetine, warned of potential withdrawal symptoms.  Re-start trazodone, 100mg  qhs, may titrate up to 300mg  qhs.  Patient to call for appointment, may see NP or myself to do med rec.  Will titrate up buspirone for anxiety.  Consider adding back citalopram if needed.    [name] MD, PhD  Family Medicine, R2  02/26/2012 8:53 PM

## 2012-02-27 ENCOUNTER — Other Ambulatory Visit: Payer: Self-pay | Admitting: Family Medicine

## 2012-02-27 DIAGNOSIS — J449 Chronic obstructive pulmonary disease, unspecified: Secondary | ICD-10-CM

## 2012-03-01 MED ORDER — ALBUTEROL SULFATE HFA 108 (90 BASE) MCG/ACT IN AERS *I*
1.0000 | INHALATION_SPRAY | RESPIRATORY_TRACT | Status: DC | PRN
Start: 2012-03-01 — End: 2012-04-14

## 2012-03-01 MED ORDER — IPRATROPIUM-ALBUTEROL 0.5-2.5 MG/3ML IN SOLN *I*
3.0000 mL | Freq: Four times a day (QID) | RESPIRATORY_TRACT | Status: DC | PRN
Start: 2012-03-01 — End: 2012-03-16

## 2012-03-04 ENCOUNTER — Other Ambulatory Visit: Payer: Self-pay | Admitting: Family Medicine

## 2012-03-04 ENCOUNTER — Other Ambulatory Visit: Payer: Self-pay | Admitting: Primary Care

## 2012-03-04 ENCOUNTER — Encounter: Payer: Self-pay | Admitting: Family Medicine

## 2012-03-07 ENCOUNTER — Encounter: Payer: Self-pay | Admitting: Family Medicine

## 2012-03-07 ENCOUNTER — Telehealth: Payer: Self-pay | Admitting: Family Medicine

## 2012-03-07 DIAGNOSIS — G4700 Insomnia, unspecified: Secondary | ICD-10-CM

## 2012-03-07 MED ORDER — TRAZODONE HCL 100 MG PO TABS *I*
200.0000 mg | ORAL_TABLET | Freq: Every evening | ORAL | Status: DC
Start: 2012-03-07 — End: 2012-05-01

## 2012-03-07 NOTE — Telephone Encounter (Signed)
Patient calling to report trial of 200mg  trazodone superior to 100mg  qhs, needs change in Rx so she does not run out.  Also asking about upcoming appointment- none scheduled on review of schedule.  Would like to review currently prescribed medications.  She also reports that she will be moving into new apartment soon and out of ABD, doing walk through tomorrow.  Suggested familiarization with Care Manager given upcoming transition and multiple medical conditions, at risk for hospitalization.    [name] MD, PhD  Family Medicine, R2  03/07/2012 7:18 PM

## 2012-03-08 ENCOUNTER — Telehealth: Payer: Self-pay

## 2012-03-08 NOTE — Telephone Encounter (Signed)
Gloria Ray Jensyn';s case worker is calling in to check up on paperwork that was faxed in for her housing. Have you seen this paperwork? Gloria Ray can be reached at [phone] x [phone].

## 2012-03-09 NOTE — Telephone Encounter (Signed)
I have paperwork- it is a 13 page assessment received 12/30.  I am completing, ideally patient would schedule visit to complete this substantial documentation.  I called the patient to discuss this, she is approved for housing, completed walk through yesterday, and they will giver her keys when this document is received.  I have committed to finishing today and faxing document this evening in light of this circumstance.  Patient currently residing at [facility] shelter.    [name] MD, PhD  Family Medicine, R2  03/09/2012 10:10 AM

## 2012-03-10 ENCOUNTER — Other Ambulatory Visit: Payer: Self-pay | Admitting: Family Medicine

## 2012-03-10 ENCOUNTER — Encounter: Payer: Self-pay | Admitting: Family Medicine

## 2012-03-11 ENCOUNTER — Telehealth: Payer: Self-pay | Admitting: Licensed Practical Nurse

## 2012-03-11 NOTE — Telephone Encounter (Signed)
Writer spoke with pt r/t current c/o's "cramps in fingers, legs, and stomach" offered appt with NP for tomorrow, pt declined, prefers seeing Dr. Juliane Poot. No appt available with Juliane Poot till 03/29/12; pt scheduled for 1:15 pm; agrees to call sooner if cramping continues to be a problem. Writer asked about status of moving in to new apartment. Pt stated the paperwork needs to be faxed to [facility] by Wednesday 03/13/12 (stated Dr. Juliane Poot has the paperwork and is aware of deadline)  Writer explained to pt that as soon as she moves in to her apartment, referral can be done to [facility] for home care and order can be done for hospital bed. Pt very happy about that plan.

## 2012-03-11 NOTE — Telephone Encounter (Signed)
Pt stated that you advised her to give a call to let you know of her pains.  She is having cramps in her hands, back, legs, chest . Pt declined speaking with nurse or scheduling appt . She would like a call back form Dr. Juliane Poot at phone # in chart.

## 2012-03-12 ENCOUNTER — Encounter: Payer: Self-pay | Admitting: Emergency Medicine

## 2012-03-12 ENCOUNTER — Telehealth: Payer: Self-pay | Admitting: Licensed Practical Nurse

## 2012-03-12 ENCOUNTER — Inpatient Hospital Stay
Admit: 2012-03-12 | Disposition: A | Discharge status: Home / Self Care | Payer: Self-pay | Source: Ambulatory Visit | Admitting: Internal Medicine

## 2012-03-12 ENCOUNTER — Other Ambulatory Visit: Payer: Self-pay | Admitting: Gastroenterology

## 2012-03-12 LAB — CBC AND DIFFERENTIAL
Baso # K/uL: 0 10*3/uL (ref 0.0–0.1)
Basophil %: 0.3 % (ref 0.1–1.2)
Eos # K/uL: 0.1 10*3/uL (ref 0.0–0.4)
Eosinophil %: 0.7 % (ref 0.7–5.8)
Hematocrit: 35 % (ref 34–45)
Hemoglobin: 10.5 g/dL — ABNORMAL LOW (ref 11.2–15.7)
Lymph # K/uL: 2.3 10*3/uL (ref 1.2–3.7)
Lymphocyte %: 33.7 % (ref 19.3–51.7)
MCV: 81 fL (ref 79–95)
Mono # K/uL: 0.4 10*3/uL (ref 0.2–0.9)
Monocyte %: 5.5 % (ref 4.7–12.5)
Neut # K/uL: 4.1 10*3/uL (ref 1.6–6.1)
Platelets: 272 10*3/uL (ref 160–370)
RBC: 4.3 MIL/uL (ref 3.9–5.2)
RDW: 17.9 % — ABNORMAL HIGH (ref 11.7–14.4)
Seg Neut %: 59.7 % (ref 34.0–71.1)
WBC: 6.9 10*3/uL (ref 4.0–10.0)

## 2012-03-12 LAB — COMPREHENSIVE METABOLIC PANEL
ALT: 17 U/L (ref 0–35)
AST: 13 U/L (ref 0–35)
Albumin: 3.8 g/dL (ref 3.5–5.2)
Alk Phos: 99 U/L (ref 35–105)
Anion Gap: 14 (ref 7–16)
Bilirubin,Total: 0.2 mg/dL (ref 0.0–1.2)
CO2: 25 mmol/L (ref 20–28)
Calcium: 9 mg/dL (ref 8.6–10.2)
Chloride: 96 mmol/L (ref 96–108)
Creatinine: 1.28 mg/dL — ABNORMAL HIGH (ref 0.51–0.95)
GFR,Black: 54 * — AB
GFR,Caucasian: 47 * — AB
Globulin: 2.7 g/dL (ref 2.7–4.3)
Glucose: 396 mg/dL — ABNORMAL HIGH (ref 60–99)
Lab: 17 mg/dL (ref 6–20)
Potassium: 3.7 mmol/L (ref 3.3–5.1)
Sodium: 135 mmol/L (ref 133–145)
Total Protein: 6.5 g/dL (ref 6.3–7.7)

## 2012-03-12 LAB — LACTATE, VENOUS, WHOLE BLOOD: Lactate VEN,WB: 2.8 mmol/L — ABNORMAL HIGH (ref 0.5–2.2)

## 2012-03-12 LAB — CK ISOENZYMES
CK: 266 U/L — ABNORMAL HIGH (ref 34–145)
Mass CKMB: 4.6 ng/mL — ABNORMAL HIGH (ref 0.0–2.9)
Relative Index: 1.7 % (ref 0.0–5.0)

## 2012-03-12 LAB — POCT GLUCOSE
Glucose POCT: 257 mg/dL — ABNORMAL HIGH (ref 60–99)
Glucose POCT: 350 mg/dL — ABNORMAL HIGH (ref 60–99)

## 2012-03-12 LAB — VENOUS BLOOD GAS
Base Excess,VENOUS: 2
Bicarbonate,VENOUS: 28 mmol/L (ref 22–30)
CO2 (Calc),VENOUS: 29 mmol/L (ref 22–31)
CO: 1.2 % (ref 0.0–1.4)
FO2 HB,VENOUS: 93 % — ABNORMAL HIGH (ref 63–83)
Hemoglobin: 11.5 g/dL (ref 11.2–15.7)
Methemoglobin: 0.3 % (ref 0.0–1.0)
PCO2,VENOUS: 50 mm Hg (ref 40–50)
PH,VENOUS: 7.36 (ref 7.32–7.42)
PO2,VENOUS: 74 mm Hg — ABNORMAL HIGH (ref 25–43)

## 2012-03-12 LAB — HOLD BLUE

## 2012-03-12 LAB — MAGNESIUM: Magnesium: 1.4 mEq/L (ref 1.3–2.1)

## 2012-03-12 LAB — NT-PRO BNP: NT-pro BNP: <50 pg/mL (ref 0–900)

## 2012-03-12 LAB — HOLD GREEN WITH GEL

## 2012-03-12 LAB — TSH: TSH: 1.43 u[IU]/mL (ref 0.27–4.20)

## 2012-03-12 LAB — TROPONIN T: Troponin T: <0.01 ng/mL (ref 0.00–0.02)

## 2012-03-12 MED ORDER — BUSPIRONE HCL 5 MG PO TABS *I*
10.0000 mg | ORAL_TABLET | Freq: Two times a day (BID) | ORAL | Status: DC
Start: 2012-03-12 — End: 2012-03-14
  Administered 2012-03-12 – 2012-03-14 (×4): 10 mg via ORAL
  Filled 2012-03-12 (×5): qty 2

## 2012-03-12 MED ORDER — ALBUTEROL SULFATE (2.5 MG/3ML) 0.083% IN NEBU *I*
7.5000 mg | INHALATION_SOLUTION | RESPIRATORY_TRACT | Status: AC
Start: 2012-03-12 — End: 2012-03-12
  Administered 2012-03-12: 7.5 mg via RESPIRATORY_TRACT
  Filled 2012-03-12: qty 9

## 2012-03-12 MED ORDER — METHYLPREDNISOLONE SOD SUCC 125 MG IJ SOLR(62.5MG/ML) *WRAPPED*
125.0000 mg | Freq: Once | INTRAMUSCULAR | Status: AC
Start: 2012-03-12 — End: 2012-03-12
  Administered 2012-03-12: 125 mg via INTRAVENOUS
  Filled 2012-03-12: qty 2

## 2012-03-12 MED ORDER — ASPIRIN 325 MG PO TABS *I*
325.0000 mg | ORAL_TABLET | Freq: Every day | ORAL | Status: DC
Start: 2012-03-12 — End: 2012-03-14
  Administered 2012-03-13 – 2012-03-14 (×2): 325 mg via ORAL
  Filled 2012-03-12 (×2): qty 1

## 2012-03-12 MED ORDER — IPRATROPIUM-ALBUTEROL 0.5-2.5 MG/3ML IN SOLN *I*
3.0000 mL | RESPIRATORY_TRACT | Status: DC
Start: 2012-03-12 — End: 2012-03-14
  Administered 2012-03-12 – 2012-03-14 (×9): 3 mL via RESPIRATORY_TRACT
  Filled 2012-03-12 (×9): qty 3

## 2012-03-12 MED ORDER — DEXTROSE 50 % IV SOLN *I*
25.0000 g | INTRAVENOUS | Status: DC | PRN
Start: 2012-03-12 — End: 2012-03-14

## 2012-03-12 MED ORDER — ACETAMINOPHEN 325 MG PO TABS *I*
650.0000 mg | ORAL_TABLET | ORAL | Status: DC | PRN
Start: 2012-03-12 — End: 2012-03-14
  Administered 2012-03-14: 650 mg via ORAL
  Filled 2012-03-12: qty 2

## 2012-03-12 MED ORDER — IPRATROPIUM BROMIDE 0.02 % IN SOLN *I*
500.0000 ug | RESPIRATORY_TRACT | Status: AC
Start: 2012-03-12 — End: 2012-03-12
  Administered 2012-03-12: 500 ug via RESPIRATORY_TRACT
  Filled 2012-03-12: qty 2.5

## 2012-03-12 MED ORDER — GLUCAGON HCL (RDNA) 1 MG IJ SOLR *WRAPPED*
1.0000 mg | INTRAMUSCULAR | Status: DC | PRN
Start: 2012-03-12 — End: 2012-03-14

## 2012-03-12 MED ORDER — LOSARTAN POTASSIUM 25 MG PO TABS *I*
100.0000 mg | ORAL_TABLET | Freq: Every day | ORAL | Status: DC
Start: 2012-03-12 — End: 2012-03-14
  Administered 2012-03-13 – 2012-03-14 (×2): 100 mg via ORAL
  Filled 2012-03-12 (×3): qty 4

## 2012-03-12 MED ORDER — TRAZODONE HCL 50 MG PO TABS *I*
200.0000 mg | ORAL_TABLET | Freq: Every evening | ORAL | Status: DC
Start: 2012-03-12 — End: 2012-03-14
  Administered 2012-03-12 – 2012-03-13 (×2): 200 mg via ORAL
  Filled 2012-03-12 (×2): qty 4

## 2012-03-12 MED ORDER — OSELTAMIVIR PHOSPHATE 75 MG PO CAPS *I*
75.0000 mg | ORAL_CAPSULE | Freq: Once | ORAL | Status: AC
Start: 2012-03-12 — End: 2012-03-12
  Administered 2012-03-12: 75 mg via ORAL
  Filled 2012-03-12: qty 1

## 2012-03-12 MED ORDER — PANTOPRAZOLE SODIUM 40 MG PO TBEC *I*
40.0000 mg | DELAYED_RELEASE_TABLET | Freq: Every day | ORAL | Status: DC
Start: 2012-03-12 — End: 2012-03-14
  Administered 2012-03-13 – 2012-03-14 (×2): 40 mg via ORAL
  Filled 2012-03-12 (×2): qty 1

## 2012-03-12 MED ORDER — GLUCOSE 40 % PO GEL *I*
15.0000 g | ORAL | Status: DC | PRN
Start: 2012-03-12 — End: 2012-03-14

## 2012-03-12 MED ORDER — KETOROLAC TROMETHAMINE 30 MG/ML IJ SOLN *I*
15.0000 mg | Freq: Four times a day (QID) | INTRAMUSCULAR | Status: DC | PRN
Start: 2012-03-12 — End: 2012-03-14

## 2012-03-12 MED ORDER — CLOPIDOGREL BISULFATE 75 MG PO TABS *I*
75.0000 mg | ORAL_TABLET | Freq: Every day | ORAL | Status: DC
Start: 2012-03-12 — End: 2012-03-14
  Administered 2012-03-13 – 2012-03-14 (×2): 75 mg via ORAL
  Filled 2012-03-12 (×2): qty 1

## 2012-03-12 MED ORDER — ONDANSETRON HCL 2 MG/ML IV SOLN *I*
4.0000 mg | Freq: Four times a day (QID) | INTRAMUSCULAR | Status: DC | PRN
Start: 2012-03-12 — End: 2012-03-14

## 2012-03-12 MED ORDER — SODIUM CHLORIDE 0.9 % IV SOLN WRAPPED *I*
125.0000 mL/h | Status: DC
Start: 2012-03-12 — End: 2012-03-13
  Administered 2012-03-12 – 2012-03-13 (×7): 125 mL/h via INTRAVENOUS

## 2012-03-12 MED ORDER — MORPHINE SULFATE ER 30 MG PO TBCR *I*
30.0000 mg | ORAL_TABLET | Freq: Two times a day (BID) | ORAL | Status: DC
Start: 2012-03-12 — End: 2012-03-14
  Administered 2012-03-12 – 2012-03-14 (×4): 30 mg via ORAL
  Filled 2012-03-12 (×4): qty 1

## 2012-03-12 MED ORDER — ROSUVASTATIN CALCIUM 10 MG PO TABS *I*
10.0000 mg | ORAL_TABLET | Freq: Every day | ORAL | Status: DC
Start: 2012-03-12 — End: 2012-03-14
  Administered 2012-03-12 – 2012-03-13 (×2): 10 mg via ORAL
  Filled 2012-03-12 (×4): qty 1

## 2012-03-12 MED ORDER — INSULIN GLARGINE 100 UNIT/ML SC SOLN *WRAPPED*
80.0000 [IU] | Freq: Every evening | SUBCUTANEOUS | Status: DC
Start: 2012-03-12 — End: 2012-03-14
  Administered 2012-03-12 – 2012-03-13 (×2): 80 [IU] via SUBCUTANEOUS

## 2012-03-12 MED ORDER — METHYLPREDNISOLONE SOD SUCC 125 MG IJ SOLR(62.5MG/ML) *WRAPPED*
60.0000 mg | Freq: Four times a day (QID) | INTRAMUSCULAR | Status: DC
Start: 2012-03-12 — End: 2012-03-13
  Administered 2012-03-12 – 2012-03-13 (×3): 60 mg via INTRAVENOUS
  Filled 2012-03-12 (×3): qty 2

## 2012-03-12 MED ORDER — AMLODIPINE BESYLATE 5 MG PO TABS *I*
10.0000 mg | ORAL_TABLET | Freq: Every day | ORAL | Status: DC
Start: 2012-03-12 — End: 2012-03-14
  Administered 2012-03-13 – 2012-03-14 (×2): 10 mg via ORAL
  Filled 2012-03-12 (×2): qty 2

## 2012-03-12 MED ORDER — INSULIN LISPRO (HUMAN) 100 UNIT/ML IJ/SC SOLN *WRAPPED*
30.0000 [IU] | Freq: Three times a day (TID) | SUBCUTANEOUS | Status: DC
Start: 2012-03-12 — End: 2012-03-14
  Administered 2012-03-12 – 2012-03-14 (×6): 30 [IU] via SUBCUTANEOUS
  Filled 2012-03-12: qty 0.3

## 2012-03-12 MED ORDER — ALBUTEROL SULFATE (2.5 MG/3ML) 0.083% IN NEBU *I*
2.5000 mg | INHALATION_SOLUTION | RESPIRATORY_TRACT | Status: DC | PRN
Start: 2012-03-12 — End: 2012-03-14
  Administered 2012-03-13 – 2012-03-14 (×2): 2.5 mg via RESPIRATORY_TRACT
  Filled 2012-03-12 (×2): qty 3

## 2012-03-12 MED ORDER — BUDESONIDE-FORMOTEROL FUMARATE 160-4.5 MCG/ACT IN AERO *I*
2.0000 | INHALATION_SPRAY | Freq: Two times a day (BID) | RESPIRATORY_TRACT | Status: DC
Start: 2012-03-12 — End: 2012-03-14
  Administered 2012-03-12 – 2012-03-14 (×4): 2 via RESPIRATORY_TRACT
  Filled 2012-03-12: qty 6

## 2012-03-12 NOTE — Telephone Encounter (Addendum)
Writer called [facility] to confirm pt's apartment number and move in date so the referral for [facility] home nursing can be initiated. They do not have anyone by that name according to Investment banker, corporate, Almira Coaster.  Writer called pt to clarify name of apartment complex when pt informed that she is currently in [facility] ED due to her asthma. Writer briefly explained that the [facility] referral and hospital bed order will be addressed after she is discharged from [facility]. Pt agreed to Regulatory affairs officer after her discharge.    Dr.Heckman, please advise this writer if there are any medication or treatment changes upon discharge that I can reinforce when we discuss her home nursing referral.    Clarification of potential new residence:  [facility]  [address]  [place] [address]    Contact person: Alfonso Ramus [phone] (fax: [phone])

## 2012-03-12 NOTE — ED Notes (Signed)
[facility] trouble breathing x 2 days. Describes feeling tight in her chest. Denies fever/cough. Pt reports hx of asthma/copd.

## 2012-03-12 NOTE — ED Provider Notes (Signed)
History     Chief Complaint   Patient presents with   . Shortness of Breath     HPI Comments: SOB and coughing Wheezing Aching for three days Lives in shelter she says    Patient is a 57 y.o. female presenting with shortness of breath.   History provided by:  Patient  Language interpreter used: No    Shortness of Breath  Severity:  Moderate  Onset quality:  Gradual  Timing:  Constant  Progression:  Worsening  Chronicity:  Recurrent  Context: activity    Context: not animal exposure, not emotional upset, not fumes, not known allergens, not occupational exposure, not pollens, not smoke exposure, not strong odors, not URI and not weather changes    Relieved by:  Nothing  Worsened by:  Nothing tried  Ineffective treatments:  None tried  Associated symptoms: no abdominal pain, no chest pain, no claudication, no cough, no diaphoresis, no ear pain, no fever, no headaches, no neck pain, no PND, no rash, no sore throat, no sputum production, no syncope, no swollen glands, no vomiting and no wheezing    Risk factors: no recent alcohol use, no family hx of DVT, no hx of cancer, no hx of PE/DVT, no obesity, no oral contraceptive use, no prolonged immobilization, no recent surgery and no tobacco use        Past Medical History   Diagnosis Date   . Asthma    . COPD (chronic obstructive pulmonary disease)    . Coronary artery disease    . Diabetes mellitus    . Hypertension    . Ischemic Stroke 03/06/2006     Created by Conversion    . Prior Myocardial Infarction 03/19/2007     Created by Conversion    . Unspecified cerebral artery occlusion with cerebral infarction      2008   . Arthritis             Past Surgical History   Procedure Laterality Date   . Coronary angioplasty with stent placement     . Bunionectomy         Family History   Problem Relation Age of Onset   . Diabetes Other    . Heart disease Other    . Hypertension Other    . Coronary art dis Other    . Hypertension Mother    . Diabetes Mother    . Alcohol abuse  Father    . Other Father      cirrhosis   . Arthritis Sister    . Substance abuse Sister    . Other Brother      HIV   . Substance abuse Brother    . Diabetes Brother          Social History      reports that she quit smoking about 17 months ago. Her smoking use included Cigarettes. She smoked 0.00 packs per day. She has never used smokeless tobacco. She reports that she does not drink alcohol or use illicit drugs. His sexual activity history not on file.    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     Employment Retired    Domestic Violence Risk No          Review of Systems  Review of Systems   Constitutional: Negative for fever, chills, diaphoresis, activity change, appetite change, fatigue and unexpected weight change.   HENT: Negative for ear pain, nosebleeds, congestion, sore throat, trouble swallowing, neck pain, neck stiffness, voice change, postnasal drip and ear discharge.    Eyes: Negative for discharge and visual disturbance.   Respiratory: Negative for cough, sputum production, choking, chest tightness, shortness of breath and wheezing.    Cardiovascular: Negative for chest pain, palpitations, claudication, leg swelling, syncope and PND.   Gastrointestinal: Negative for nausea, vomiting, abdominal pain, constipation and abdominal distention.   Genitourinary: Negative for dysuria, urgency, frequency, vaginal discharge and difficulty urinating.   Musculoskeletal: Negative for arthralgias.   Skin: Negative.  Negative for rash.   Neurological: Negative for dizziness, syncope, speech difficulty, weakness, light-headedness, numbness and headaches.   Psychiatric/Behavioral: Negative.        Physical Exam     ED Triage Vitals   BP Heart Rate Heart Rate(via Pulse Ox) Resp Temp Temp Source SpO2 O2 Device O2 Flow Rate   03/12/12 1317 03/12/12 1317 -- 03/12/12 1317  03/12/12 1317 03/12/12 1317 03/12/12 1317 03/12/12 1417 03/12/12 1417   143/81 mmHg 83  26 35.9 C (96.6 F) TEMPORAL 96 % Nasal cannula 2 L/min      Weight           03/12/12 1317           99.791 kg (220 lb)               Physical Exam   Nursing note and vitals reviewed.  Constitutional: She is oriented to person, place, and time. She appears well-developed and well-nourished. No distress.   HENT:   Head: Normocephalic and atraumatic.   Right Ear: External ear and ear canal normal.   Left Ear: External ear and ear canal normal.   Nose: Nose normal.   Mouth/Throat: Oropharynx is clear and moist. No oropharyngeal exudate.   Eyes: Conjunctivae and EOM are normal. Pupils are equal, round, and reactive to light. Right eye exhibits no discharge. Left eye exhibits no discharge. No scleral icterus.   Neck: Normal range of motion. Neck supple. No tracheal deviation present. No thyromegaly present.   Cardiovascular: Normal rate, regular rhythm, normal heart sounds and intact distal pulses.    No murmur heard.  Pulmonary/Chest: Effort normal. No stridor. No respiratory distress. She has wheezes. She has no rales.   Abdominal: Soft. Bowel sounds are normal. She exhibits no distension. There is no tenderness.   Musculoskeletal: Normal range of motion. She exhibits no edema.   Lymphadenopathy:     She has no cervical adenopathy.   Neurological: She is alert and oriented to person, place, and time.   Skin: Skin is warm and dry. She is not diaphoretic.   Psychiatric: She has a normal mood and affect. Her behavior is normal. Judgment and thought content normal.       Medical Decision Making   <EDMDM>    Initial Evaluation:  ED First Provider Contact    Date/Time Event User Comments    03/12/12 1347 ED Provider First Contact [facility], Ledora Delker R Initial Face to Face Provider Contact          Patient seen by me as above    Assessment:  57 y.o., female comes to the ED with wheezing and aching    Differential Diagnosis includes COPD exac  with flu              Plan:  labs imaging nebs steroids      Mikalyn Hermida R Kemaria Dedic, MD    Peak flow 300 after neb. ED OBS stay    Almetta Lovely, MD  03/12/12 [phone]

## 2012-03-12 NOTE — ED Notes (Signed)
Bed:A-08<BR> Expected date:03/12/12<BR> Expected time:<BR> Means of arrival:<BR> Comments:<BR>

## 2012-03-12 NOTE — ED Notes (Signed)
03/12/12 1733   Observation Care   Observation care initiated  Yes

## 2012-03-12 NOTE — ED Notes (Signed)
Peak flow   #1 270  #2 200  #3 250

## 2012-03-12 NOTE — ED Notes (Signed)
Post Neb Peak Flows  #1 300  #2 260  #3 270

## 2012-03-12 NOTE — Progress Notes (Signed)
Utilization Management    Level of Care Observation service as of the date 03/12/2012      Jariyah Hackley A Matteo Banke, RN     Pager: [phone]

## 2012-03-12 NOTE — Progress Notes (Signed)
Utilization Management    Level of Care Inpatient as of the date 03/12/2012(ehr)      Marco Collie, RN     Pager: [phone]

## 2012-03-12 NOTE — ED Obs Notes (Addendum)
ED OBSERVATION ADMISSION NOTE    Patient seen by me today, 03/12/2012 at 6:39 PM    Current patient status: Observation    History     Chief Complaint   Patient presents with   . Shortness of Breath     HPI Comments: 57 year old female with PMH significant for DM II, HTN, asthma, HLD presents with shortness of breath and wheeze. Pt reports gradually worsening shortness of breath, wheezing and myalgias for the past 3 days. Pt also reports cough, headache, fatigue. Pt denies chest pain, palpitations, fever, chills, nausea, vomiting,  dizziness.          History provided by:  Patient  Language interpreter used: No        Past Medical History   Diagnosis Date   . Asthma    . COPD (chronic obstructive pulmonary disease)    . Coronary artery disease    . Diabetes mellitus    . Hypertension    . Ischemic Stroke 03/06/2006     Created by Conversion    . Prior Myocardial Infarction 03/19/2007     Created by Conversion    . Unspecified cerebral artery occlusion with cerebral infarction      2008   . Arthritis        Past Surgical History   Procedure Laterality Date   . Coronary angioplasty with stent placement     . Bunionectomy         Family History   Problem Relation Age of Onset   . Diabetes Other    . Heart disease Other    . Hypertension Other    . Coronary art dis Other    . Hypertension Mother    . Diabetes Mother    . Alcohol abuse Father    . Other Father      cirrhosis   . Arthritis Sister    . Substance abuse Sister    . Other Brother      HIV   . Substance abuse Brother    . Diabetes Brother        Social History      reports that she quit smoking about 17 months ago. Her smoking use included Cigarettes. She smoked 0.00 packs per day. She has never used smokeless tobacco. She reports that she does not drink alcohol or use illicit drugs. His sexual activity history not on file.    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 Negative for fever, chills, diaphoresis and appetite change.   HENT: Negative for congestion.    Eyes: Negative for visual disturbance.   Respiratory: Positive for cough, chest tightness, shortness of breath and wheezing.    Cardiovascular: Negative for chest pain, palpitations and leg swelling.   Gastrointestinal: Negative for nausea, vomiting, abdominal pain and diarrhea.   Genitourinary: Negative for dysuria, urgency and frequency.   Musculoskeletal: Positive for myalgias.   Skin: Negative for rash.   Neurological: Positive for headaches. Negative for dizziness, syncope, weakness and light-headedness.   Psychiatric/Behavioral: The patient is not nervous/anxious.  Physical Exam   BP 133/84  Pulse 81  Temp(Src) 36.5 C (97.7 F) (Temporal)  Resp 18  Ht 1.626 m (5\' 4" )  Wt 99.791 kg (220 lb)  BMI 37.74 kg/m2  SpO2 94%    Physical Exam   Vitals reviewed.  Constitutional: She is oriented to person, place, and time. She appears well-developed and well-nourished. No distress.   HENT:   Head: Normocephalic and atraumatic.   Right Ear: External ear normal.   Left Ear: External ear normal.   Eyes: Conjunctivae and EOM are normal.   Neck: Normal range of motion. Neck supple.   Cardiovascular: Normal rate, regular rhythm and normal heart sounds.  Exam reveals no gallop.    No murmur heard.  Pulmonary/Chest: Effort normal. No respiratory distress. She has wheezes. She has no rales. She exhibits no tenderness.   Abdominal: Soft. Bowel sounds are normal. She exhibits no distension. There is no tenderness. There is no rebound and no guarding.   Musculoskeletal: Normal range of motion. She exhibits no edema and no tenderness.   Neurological: She is alert and oriented to person, place, and time. She has normal reflexes. She  displays normal reflexes. No cranial nerve deficit. Coordination normal.   Skin: Skin is warm and dry. No rash noted. She is not diaphoretic.   Psychiatric: She has a normal mood and affect. Her behavior is normal. Judgment and thought content normal.       Tests    Labs:   All labs in the last 24 hours   Recent Results (from the past 24 hour(s))   VENOUS BLOOD GAS    Collection Time    03/12/12  2:13 PM       Result Value Range    Hemoglobin 11.5  11.2 - 15.7 g/dL    PH,VENOUS 1.61  0.96 - 7.42    PCO2,VENOUS 50  40 - 50 mm Hg    PO2,VENOUS 74 (*) 25 - 43 mm Hg    Bicarbonate,VENOUS 28  22 - 30 mmol/L    Base Excess,VENOUS 2      CO2 (Calc),VENOUS 29  22 - 31 mmol/L    FO2 HB,VENOUS 93 (*) 63 - 83 %    CO 1.2  0.0 - 1.4 %    Methemoglobin 0.3  0.0 - 1.0 %   CK ISOENZYMES    Collection Time    03/12/12  2:13 PM       Result Value Range    CK 266 (*) 34 - 145 U/L    Mass CKMB 4.6 (*) 0.0 - 2.9 ng/mL    Relative Index 1.7  0.0 - 5.0 %   COMPREHENSIVE METABOLIC PANEL    Collection Time    03/12/12  2:13 PM       Result Value Range    Sodium 135  133 - 145 mmol/L    Potassium 3.7  3.3 - 5.1 mmol/L    Chloride 96  96 - 108 mmol/L    CO2 25  20 - 28 mmol/L    Anion Gap 14  7 - 16    UN 17  6 - 20 mg/dL    Creatinine 0.45 (*) 0.51 - 0.95 mg/dL    GFR,Caucasian 47 (*)     GFR,Black 54 (*)     Glucose 396 (*) 60 - 99 mg/dL    Calcium 9.0  8.6 - 40.9 mg/dL    Total Protein 6.5  6.3 - 7.7 g/dL  Albumin 3.8  3.5 - 5.2 g/dL    Globulin 2.7  2.7 - 4.3 g/dL    Bilirubin,Total 0.2  0.0 - 1.2 mg/dL    AST 13  0 - 35 U/L    ALT 17  0 - 35 U/L    Alk Phos 99  35 - 105 U/L   MAGNESIUM    Collection Time    03/12/12  2:13 PM       Result Value Range    Magnesium 1.4  1.3 - 2.1 mEq/L   NT-PRO BNP    Collection Time    03/12/12  2:13 PM       Result Value Range    NT-pro BNP <50  0 - 900 pg/mL   TROPONIN T    Collection Time    03/12/12  2:13 PM       Result Value Range    Troponin T <0.01  0.00 - 0.02 ng/mL   TSH    Collection Time    03/12/12  2:13  PM       Result Value Range    TSH 1.43  0.27 - 4.20 uIU/mL   CBC AND DIFFERENTIAL    Collection Time    03/12/12  2:13 PM       Result Value Range    WBC 6.9  4.0 - 10.0 THOU/uL    RBC 4.3  3.9 - 5.2 MIL/uL    Hemoglobin 10.5 (*) 11.2 - 15.7 g/dL    Hematocrit 35  34 - 45 %    MCV 81  79 - 95 fL    RDW 17.9 (*) 11.7 - 14.4 %    Platelets 272  160 - 370 THOU/uL    Seg Neut % 59.7  34.0 - 71.1 %    Lymphocyte % 33.7  19.3 - 51.7 %    Monocyte % 5.5  4.7 - 12.5 %    Eosinophil % 0.7  0.7 - 5.8 %    Basophil % 0.3  0.1 - 1.2 %    Neut # K/uL 4.1  1.6 - 6.1 THOU/uL    Lymph # K/uL 2.3  1.2 - 3.7 THOU/uL    Mono # K/uL 0.4  0.2 - 0.9 THOU/uL    Eos # K/uL 0.1  0.0 - 0.4 THOU/uL    Baso # K/uL 0.0  0.0 - 0.1 THOU/uL   HOLD BLUE    Collection Time    03/12/12  2:13 PM       Result Value Range    Hold Blue HOLD TUBE     HOLD GREEN WITH GEL    Collection Time    03/12/12  2:13 PM       Result Value Range    Hold Green (w/gel,spun) HOLD TUBE     BLOOD CULTURE    Collection Time    03/12/12  2:13 PM       Result Value Range    Bacterial Blood Culture .     BLOOD CULTURE    Collection Time    03/12/12  2:13 PM       Result Value Range    Bacterial Blood Culture .     LACTATE, VENOUS, WHOLE BLOOD    Collection Time    03/12/12  2:13 PM       Result Value Range    Lactate VEN,WB 2.8 (*) 0.5 - 2.2 mmol/L   POCT GLUCOSE    Collection Time  03/12/12  6:22 PM       Result Value Range    Glucose POCT 257 (*) 60 - 99 mg/dL        Imaging:  Chest xray: Mild right basilar consolidation likely due to atelectasis. A small right pleural effusion cannot be excluded.    Medical Decision Making   <EDMDM>    Assessment:  56 y.o., female placed in OBS after evaluation in the ED for shortness of breath and wheezing. CMP, CBC, trop unremarkable. BNP <50. Influenza PCR and blood cultures pending. Chest xray shows mild right basilar consolidation likely due to atelectasis. VS normal.     Differential Diagnosis includes asthma exacerbation, asthmatic bronchitis,  influenza    Plan:   1) Neuro:  No active issues.    2) Pulmonary:  Pt will receive IV solu-medrol, nebs q4h and q2h prn, will monitor peak flows before and after each nebulization to monitor for improvement. Will place pt on telemetry to monitor heart rate and O2 saturation, with NC O2 if sat <90. Will ensure no evolving PNA through blood work and symptomatic changes. Repeat labs in am. VS q4hr.     3) Cardiac:  No active issues.    4) GI:  No active issues.    5) Renal:  No active issues.    6) ID:  Will continue Tamiflu for possible influenza    7) Heme:  No active issues.    8) FEN: Diabetic diet, NS@125 /hr .    9) Endo: Continue home insulin regimen, check BGs AC/HS and 0300.    10) Code Status:  Full Code    Samara Snide, NP    Addendum: Pt to be made inpatient status per EHR based on hypoxia and history of DM II

## 2012-03-12 NOTE — ED Notes (Signed)
Transferred to EOU #40 by stretcher. Ambulated with standby to bed. Placed on telemetry. Slurred speech baseline per pt. Reports living in shelter, has assistance with medications. Will be moving to [facility] in 1 to 2 days states she will be able to get medication. Oriented to room, bathroom location, TV, phone and call bell.

## 2012-03-13 DIAGNOSIS — J45901 Unspecified asthma with (acute) exacerbation: Secondary | ICD-10-CM

## 2012-03-13 LAB — BASIC METABOLIC PANEL
Anion Gap: 12 (ref 7–16)
CO2: 23 mmol/L (ref 20–28)
Calcium: 9.4 mg/dL (ref 8.6–10.2)
Chloride: 98 mmol/L (ref 96–108)
Creatinine: 1.2 mg/dL — ABNORMAL HIGH (ref 0.51–0.95)
GFR,Black: 59 * — AB
GFR,Caucasian: 51 * — AB
Glucose: 419 mg/dL — ABNORMAL HIGH (ref 60–99)
Lab: 22 mg/dL — ABNORMAL HIGH (ref 6–20)
Potassium: 4.5 mmol/L (ref 3.3–5.1)
Sodium: 133 mmol/L (ref 133–145)

## 2012-03-13 LAB — CBC AND DIFFERENTIAL
Baso # K/uL: 0 10*3/uL (ref 0.0–0.1)
Basophil %: 0.1 % (ref 0.1–1.2)
Eos # K/uL: 0 10*3/uL (ref 0.0–0.4)
Eosinophil %: 0 % — ABNORMAL LOW (ref 0.7–5.8)
Hematocrit: 35 % (ref 34–45)
Hemoglobin: 10.6 g/dL — ABNORMAL LOW (ref 11.2–15.7)
Lymph # K/uL: 1.3 10*3/uL (ref 1.2–3.7)
Lymphocyte %: 18.5 % — ABNORMAL LOW (ref 19.3–51.7)
MCV: 81 fL (ref 79–95)
Mono # K/uL: 0.1 10*3/uL — ABNORMAL LOW (ref 0.2–0.9)
Monocyte %: 1 % — ABNORMAL LOW (ref 4.7–12.5)
Neut # K/uL: 5.7 10*3/uL (ref 1.6–6.1)
Platelets: 302 10*3/uL (ref 160–370)
RBC: 4.3 MIL/uL (ref 3.9–5.2)
RDW: 17.5 % — ABNORMAL HIGH (ref 11.7–14.4)
Seg Neut %: 80.1 % — ABNORMAL HIGH (ref 34.0–71.1)
WBC: 7.1 10*3/uL (ref 4.0–10.0)

## 2012-03-13 LAB — POCT GLUCOSE
Glucose POCT: 421 mg/dL — ABNORMAL HIGH (ref 60–99)
Glucose POCT: 327 mg/dL — ABNORMAL HIGH (ref 60–99)
Glucose POCT: 314 mg/dL — ABNORMAL HIGH (ref 60–99)
Glucose POCT: 355 mg/dL — ABNORMAL HIGH (ref 60–99)

## 2012-03-13 LAB — URINALYSIS REFLEX TO CULTURE
Blood,UA: NEGATIVE
Glucose,UA: 1000 mg/dL — AB
Ketones, UA: NEGATIVE
Leuk Esterase,UA: NEGATIVE
Nitrite,UA: NEGATIVE
Protein,UA: 25 mg/dL — AB
Specific Gravity,UA: 1.028 (ref 1.001–1.030)
pH,UA: 5 (ref 5.0–8.0)

## 2012-03-13 LAB — URINE MICROSCOPIC (IQ200)

## 2012-03-13 LAB — INFLUENZA  A & B/RSV PCR: Influenza A&B/RSV PCR: 0

## 2012-03-13 MED ORDER — PREDNISONE 20 MG PO TABS *I*
20.0000 mg | ORAL_TABLET | Freq: Once | ORAL | Status: AC
Start: 2012-03-14 — End: 2012-03-14
  Administered 2012-03-14: 20 mg via ORAL
  Filled 2012-03-13: qty 1

## 2012-03-13 NOTE — ED Notes (Signed)
Patient's BG 421.  30 units of Humalog insulin given as ordered.  Dr. Willeen Cass notified.  No additional orders written.

## 2012-03-13 NOTE — ED Notes (Signed)
Patient is resting comfortably. 

## 2012-03-13 NOTE — ED Notes (Signed)
Peak flow post neb-250, 270, 200

## 2012-03-13 NOTE — ED Notes (Signed)
Patient experienced bloody nose while coughing after neb treatment.  Nose bleed stopped.  Mady Haagensen NP notified.  No new orders written.  Nursing to continue to monitor patient.

## 2012-03-13 NOTE — ED Notes (Signed)
Peak flow post neb-300.  Patient only able to do one peak flow.

## 2012-03-13 NOTE — ED Notes (Signed)
Peak flow pre neb-320, 260, 250

## 2012-03-13 NOTE — ED Notes (Signed)
Peak Flow:  Pre: 250, 260, 280  Post: 260, 260, 310

## 2012-03-13 NOTE — ED Notes (Signed)
Peak flow pre neb-360, 300, 250

## 2012-03-13 NOTE — Progress Notes (Signed)
SW consulted by Dr. Willeen Cass and RN Yvonna Alanis to meet with pt re" living arrangements and plans for d/c. Anticipate d/c tomorrow. Pt reports that she is currently staying at [facility] shelter ([address] She has been working with [facility] case management and reported to RN that she is supposed to be moving into [facility] within a few days. Pt is hopeful that this will still be able to happen. At pt's request, SW left a message for [place] at [facility] [phone]) to discuss d/c plan. SW to follow pending return call from [place].  Bobbye Riggs, LMSW  03/13/2012  2:03 PM

## 2012-03-13 NOTE — ED Notes (Signed)
Peak flow post neb-330, 320, 330

## 2012-03-13 NOTE — ED Obs Notes (Signed)
ED OBSERVATION FOLLOW-UP NOTE    Patient:  Gloria Ray           Vital Signs:  BP: (133-170)/(67-94)   Temp:  [35.8 C (96.4 F)-36.9 C (98.4 F)]   Temp src:  [-]   Heart Rate:  [81-100]   Resp:  [16-26]   SpO2:  [93 %-98 %]   Height:  [162.6 cm (5\' 4" )]   Weight:  [99.791 kg (220 lb)]        Subjective:  Feels improved today.    Lungs: Auscultation of the lungs revealed fine end-expiratory wheezes.    Cardiovascular: There was a regular rate and rhythm without any murmurs, gallops, rubs.     Neurologic: Awake, alert, not disoriented or confused     Labs:    Recent Labs  Lab 03/13/12  0218 03/12/12  1413   WBC 7.1 6.9   Hemoglobin 10.6* 11.5  10.5*   Hematocrit 35 35   Platelets 302 272              Xrays, past 24 hrs:   * Portable Chest Standard Ap Single View    03/12/2012  IMPRESSION:   Mild right basilar consolidation likely due to atelectasis. A small  right pleural effusion cannot be excluded.   END OF REPORT            Labs:    Recent Labs  Lab 03/13/12  0218 03/12/12  1413   Sodium 133 135   Potassium 4.5 3.7   Chloride 98 96   CO2 23 25   UN 22* 17   Creatinine 1.20* 1.28*   Glucose 419* 396*   Calcium 9.4 9.0   Magnesium  --  1.4            Active Medical Problem:  Active Hospital Problems    Diagnosis   . Asthma exacerbation        Other labs:          Micro:   Bld Bank Hld Lav   Date Value Range Status   09/19/2011 Lav In [facility]   Final        Blood Bank Hold Pink   Date Value Range Status   04/04/2009 Pink in [place]   Final        Bld Bank Hld Red   Date Value Range Status   09/19/2011 Red In Bld Bank   Final        Bacterial Blood Culture   Date Value Range Status   03/12/2012 .   Preliminary   03/12/2012 .   Preliminary        Blood,UA   Date Value Range Status   03/13/2012 NEG  NEGATIVE Final      Active Meds:       . [START ON 03/14/2012] predniSONE  20 mg Oral Once   . ipratropium-albuterol  3 mL Nebulization Q4H While awake   . insulin lispro  30 Units Subcutaneous TID AC   . insulin glargine  80  Units Subcutaneous Nightly   . amLODIPine  10 mg Oral Daily   . aspirin  325 mg Oral Daily   . budesonide-formoterol  2 puff Inhalation BID   . busPIRone  10 mg Oral BID   . clopidogrel  75 mg Oral Daily   . losartan  100 mg Oral Daily   . morphine  30 mg Oral BID   . pantoprazole  40 mg Oral Daily   .  rosuvastatin  10 mg Oral Daily with dinner   . traZODone  200 mg Oral Nightly          Assessment and Plan: This is a case of COPD exacerbation in a 57 y.o. female who is improving on nebulizers and steroid. She feels that she isn't ready for discharge today. Concerned that she will "bounce back" if she goes home (to [facility] shelter). Will keep until tomorrow and reassess, likely discharge.    -decrease steroid dose due to hyperglycemia         Author: Kirkland Hun, MD  as of: 03/13/2012  at: 1:05 PM        Author: Kirkland Hun, MD  Note created: 03/13/2012  at: 1:05 PM

## 2012-03-13 NOTE — ED Notes (Signed)
Peak flow pre neb-280, 400, 330

## 2012-03-14 ENCOUNTER — Other Ambulatory Visit: Payer: Self-pay

## 2012-03-14 ENCOUNTER — Ambulatory Visit: Payer: Self-pay

## 2012-03-14 DIAGNOSIS — F32A Depression, unspecified: Secondary | ICD-10-CM

## 2012-03-14 DIAGNOSIS — F419 Anxiety disorder, unspecified: Secondary | ICD-10-CM

## 2012-03-14 DIAGNOSIS — J45909 Unspecified asthma, uncomplicated: Principal | ICD-10-CM | POA: Diagnosis present

## 2012-03-14 LAB — CBC AND DIFFERENTIAL
Baso # K/uL: 0 10*3/uL (ref 0.0–0.1)
Basophil %: 0.1 % (ref 0.1–1.2)
Eos # K/uL: 0 10*3/uL (ref 0.0–0.4)
Eosinophil %: 0 % — ABNORMAL LOW (ref 0.7–5.8)
Hematocrit: 33 % — ABNORMAL LOW (ref 34–45)
Hemoglobin: 10.1 g/dL — ABNORMAL LOW (ref 11.2–15.7)
Lymph # K/uL: 2.4 10*3/uL (ref 1.2–3.7)
Lymphocyte %: 22 % (ref 19.3–51.7)
MCV: 80 fL (ref 79–95)
Mono # K/uL: 0.6 10*3/uL (ref 0.2–0.9)
Monocyte %: 5.1 % (ref 4.7–12.5)
Neut # K/uL: 7.8 10*3/uL — ABNORMAL HIGH (ref 1.6–6.1)
Platelets: 289 10*3/uL (ref 160–370)
RBC: 4.1 MIL/uL (ref 3.9–5.2)
RDW: 17.9 % — ABNORMAL HIGH (ref 11.7–14.4)
Seg Neut %: 72.5 % — ABNORMAL HIGH (ref 34.0–71.1)
WBC: 10.8 10*3/uL — ABNORMAL HIGH (ref 4.0–10.0)

## 2012-03-14 LAB — EKG 12-LEAD
P: 42 degrees
QRS: -1 degrees
Rate: 83 {beats}/min
Severity: BORDERLINE
Severity: BORDERLINE
Statement: BORDERLINE
T: 108 degrees

## 2012-03-14 LAB — BASIC METABOLIC PANEL
Anion Gap: 12 (ref 7–16)
CO2: 25 mmol/L (ref 20–28)
Calcium: 9.1 mg/dL (ref 8.6–10.2)
Chloride: 98 mmol/L (ref 96–108)
Creatinine: 0.98 mg/dL — ABNORMAL HIGH (ref 0.51–0.95)
GFR,Black: 75 *
GFR,Caucasian: 65 *
Glucose: 388 mg/dL — ABNORMAL HIGH (ref 60–99)
Lab: 22 mg/dL — ABNORMAL HIGH (ref 6–20)
Potassium: 4.3 mmol/L (ref 3.3–5.1)
Sodium: 135 mmol/L (ref 133–145)

## 2012-03-14 LAB — POCT GLUCOSE
Glucose POCT: 289 mg/dL — ABNORMAL HIGH (ref 60–99)
Glucose POCT: 375 mg/dL — ABNORMAL HIGH (ref 60–99)

## 2012-03-14 NOTE — ED Notes (Signed)
Unable to complete peak flows as patient is SOB. Will continue to monitor.

## 2012-03-14 NOTE — Progress Notes (Signed)
SW spoke with Consuella Lose at [facility] [phone]) who reports that it is fine for pt to return to [facility] today. She will let staff there know to expect pt back around noon, pending medical clearance. SW to follow as needed.  Bobbye Riggs, LMSW  03/14/2012  9:09 AM

## 2012-03-14 NOTE — Discharge Instructions (Signed)
Call your physician if new or worsening symptoms or further concerns.  Take medications as prescribed.

## 2012-03-14 NOTE — ED Notes (Signed)
Peak flow post neb-270

## 2012-03-14 NOTE — ED Notes (Signed)
Peak flow pre neb-320

## 2012-03-14 NOTE — ED Obs Notes (Addendum)
ED OBSERVATION PROGRESS NOTE    Patient seen by me today, 03/14/2012 at 0740    Current patient status: Inpatient    Chief Complaint:   Chief Complaint   Patient presents with   . Shortness of Breath       Subjective:  Ambulatory to rest room with dyspnea    Nursing Pain Score:  Last Nursing documented pain:  0-10 Scale: 7 (03/14/12 0302)      Vitals: Patient Vitals for the past 24 hrs:   BP Temp Temp src Pulse Resp SpO2   03/14/12 0248 173/97 mmHg 37.2 C (99 F) TEMPORAL 94 18 94 %   03/13/12 2124 148/66 mmHg 37.1 C (98.8 F) TEMPORAL 97 18 96 %   03/13/12 1845 151/85 mmHg 36.2 C (97.2 F) TEMPORAL 80 18 94 %   03/13/12 1439 140/74 mmHg 36.8 C (98.2 F) TEMPORAL 92 16 92 %   03/13/12 1023 167/86 mmHg 35.8 C (96.4 F) TEMPORAL 100 16 94 %   03/13/12 0922 170/90 mmHg - - - - -       Physical Examination:  Physical Exam   Constitutional: No distress.   Cardiovascular: Normal rate.    Pulmonary/Chest: She is in respiratory distress. She has wheezes (rare).   mild   Abdominal: There is no tenderness.   Musculoskeletal: She exhibits no edema.   Neurological: She is alert.   Skin: She is not diaphoretic.   Psychiatric: She has a normal mood and affect.       ZOX:WRUE    Lab Results:  All labs in the last 24 hours   Recent Results (from the past 24 hour(s))   POCT GLUCOSE    Collection Time    03/13/12  9:16 AM       Result Value Range    Glucose POCT 421 (*) 60 - 99 mg/dL   POCT GLUCOSE    Collection Time    03/13/12 12:54 PM       Result Value Range    Glucose POCT 327 (*) 60 - 99 mg/dL   POCT GLUCOSE    Collection Time    03/13/12  6:00 PM       Result Value Range    Glucose POCT 314 (*) 60 - 99 mg/dL   POCT GLUCOSE    Collection Time    03/13/12  9:45 PM       Result Value Range    Glucose POCT 355 (*) 60 - 99 mg/dL   BASIC METABOLIC PANEL    Collection Time    03/14/12  3:00 AM       Result Value Range    Glucose 388 (*) 60 - 99 mg/dL    Sodium 454  098 - 119 mmol/L    Potassium 4.3  3.3 - 5.1 mmol/L    Chloride 98  96 - 108  mmol/L    CO2 25  20 - 28 mmol/L    Anion Gap 12  7 - 16    UN 22 (*) 6 - 20 mg/dL    Creatinine 1.47 (*) 0.51 - 0.95 mg/dL    GFR,Caucasian 65      GFR,Black 75      Calcium 9.1  8.6 - 10.2 mg/dL   CBC AND DIFFERENTIAL    Collection Time    03/14/12  3:00 AM       Result Value Range    WBC 10.8 (*) 4.0 - 10.0 THOU/uL    RBC 4.1  3.9 - 5.2 MIL/uL    Hemoglobin 10.1 (*) 11.2 - 15.7 g/dL    Hematocrit 33 (*) 34 - 45 %    MCV 80  79 - 95 fL    RDW 17.9 (*) 11.7 - 14.4 %    Platelets 289  160 - 370 THOU/uL    Seg Neut % 72.5 (*) 34.0 - 71.1 %    Lymphocyte % 22.0  19.3 - 51.7 %    Monocyte % 5.1  4.7 - 12.5 %    Eosinophil % 0.0 (*) 0.7 - 5.8 %    Basophil % 0.1  0.1 - 1.2 %    Neut # K/uL 7.8 (*) 1.6 - 6.1 THOU/uL    Lymph # K/uL 2.4  1.2 - 3.7 THOU/uL    Mono # K/uL 0.6  0.2 - 0.9 THOU/uL    Eos # K/uL 0.0  0.0 - 0.4 THOU/uL    Baso # K/uL 0.0  0.0 - 0.1 THOU/uL     none    Imaging findings: CXR * Portable Chest Standard Ap Single View    03/12/2012  IMPRESSION:   Mild right basilar consolidation likely due to atelectasis. A small  right pleural effusion cannot be excluded.   END OF REPORT       Assessment: 57 yo with COPD exacerbation, PQ 220 at bedside, DOE, BUN 22, cr 0.98, WBC 10.8    Plan: continue nebs and steroids, check ambulatory saturation  Medically preferred DVT prophylaxis: None    1005 amb sat 93 %, stable for discharge to [facility] (SW aware)    Author: Freddy Finner, MD  Note created: 03/14/2012  at: 8:19 AM

## 2012-03-14 NOTE — Plan of Care (Addendum)
Problem: Safe Discharge Barriers  Goal: Safe Discharge  Intervention: Transportation  SW spoke with Raynelle Fanning at [employer] and arranged [employer] cab to pick pt up for d/c at the emergency department entrance at 12:35 p.m. Pt reports that she normally travels by cab and RN confirms that this is appropriate mode of transport. SW notified [facility] of d/c time.      CommentsBobbye Riggs, LMSW  03/14/2012  11:08 AM

## 2012-03-14 NOTE — ED Notes (Signed)
Patient discharged.  Instructions reviewed, questions answered.

## 2012-03-14 NOTE — ED Notes (Signed)
Patient's ambulatory O2 saturation 93-95% on RA.  Dr. Sophronia Simas notified.

## 2012-03-15 ENCOUNTER — Telehealth: Payer: Self-pay | Admitting: Licensed Practical Nurse

## 2012-03-15 NOTE — Telephone Encounter (Signed)
[facility] Emergency Room Visit Follow Up  Date of ER Visit: 03/12/12-03/14/12  Reason for Visit: SOB  Discharge Diagnosis: asthmatic bronchitis  Discharged to: home    Important Medication changes: n/a  Important self-care recommendations: f/u with pcp as scheduled and sooner as needed  Follow up lab tests or studies: n/a  Follow up appointment scheduled: 03/29/12

## 2012-03-16 ENCOUNTER — Encounter: Payer: Self-pay | Admitting: Pulmonary Disease

## 2012-03-16 ENCOUNTER — Other Ambulatory Visit: Payer: Self-pay | Admitting: Gastroenterology

## 2012-03-16 LAB — CBC AND DIFFERENTIAL
Baso # K/uL: 0 10*3/uL (ref 0.0–0.1)
Basophil %: 0.3 % (ref 0.1–1.2)
Eos # K/uL: 0.1 10*3/uL (ref 0.0–0.4)
Eosinophil %: 0.8 % (ref 0.7–5.8)
Hematocrit: 37 % (ref 34–45)
Hemoglobin: 11.9 g/dL (ref 11.2–15.7)
Lymph # K/uL: 2.5 10*3/uL (ref 1.2–3.7)
Lymphocyte %: 38 % (ref 19.3–51.7)
MCV: 80 fL (ref 79–95)
Mono # K/uL: 0.4 10*3/uL (ref 0.2–0.9)
Monocyte %: 5.6 % (ref 4.7–12.5)
Neut # K/uL: 3.7 10*3/uL (ref 1.6–6.1)
Platelets: 319 10*3/uL (ref 160–370)
RBC: 4.7 MIL/uL (ref 3.9–5.2)
RDW: 17.4 % — ABNORMAL HIGH (ref 11.7–14.4)
Seg Neut %: 55.1 % (ref 34.0–71.1)
WBC: 6.7 10*3/uL (ref 4.0–10.0)

## 2012-03-16 LAB — BASIC METABOLIC PANEL
Anion Gap: 11 (ref 7–16)
CO2: 28 mmol/L (ref 20–28)
Calcium: 9.1 mg/dL (ref 8.6–10.2)
Chloride: 98 mmol/L (ref 96–108)
Creatinine: 0.76 mg/dL (ref 0.51–0.95)
GFR,Black: 102 *
GFR,Caucasian: 88 *
Glucose: 230 mg/dL — ABNORMAL HIGH (ref 60–99)
Lab: 12 mg/dL (ref 6–20)
Potassium: 3.5 mmol/L (ref 3.3–5.1)
Sodium: 137 mmol/L (ref 133–145)

## 2012-03-16 LAB — POCT GLUCOSE
Glucose POCT: 298 mg/dL — ABNORMAL HIGH (ref 60–99)
Glucose POCT: 303 mg/dL — ABNORMAL HIGH (ref 60–99)
Glucose POCT: 382 mg/dL — ABNORMAL HIGH (ref 60–99)

## 2012-03-16 LAB — HOLD SST

## 2012-03-16 LAB — NT-PRO BNP: NT-pro BNP: 122 pg/mL (ref 0–900)

## 2012-03-16 LAB — HOLD BLUE

## 2012-03-16 LAB — BLOOD BANK HOLD LAVENDER

## 2012-03-16 LAB — HOLD RED

## 2012-03-16 LAB — TROPONIN T: Troponin T: <0.01 ng/mL (ref 0.00–0.02)

## 2012-03-16 LAB — HOLD GREEN WITH GEL

## 2012-03-16 LAB — HOLD LAVENDER

## 2012-03-16 MED ORDER — LOSARTAN POTASSIUM 25 MG PO TABS *I*
100.0000 mg | ORAL_TABLET | Freq: Every day | ORAL | Status: DC
Start: 2012-03-16 — End: 2012-03-20
  Filled 2012-03-16 (×5): qty 4

## 2012-03-16 MED ORDER — INSULIN LISPRO (HUMAN) 100 UNIT/ML IJ/SC SOLN *WRAPPED*
0.0000 [IU] | Freq: Four times a day (QID) | SUBCUTANEOUS | Status: DC
Start: 2012-03-16 — End: 2012-03-18
  Administered 2012-03-17: 8 [IU] via SUBCUTANEOUS
  Administered 2012-03-17: 12 [IU] via SUBCUTANEOUS
  Administered 2012-03-18: 6 [IU] via SUBCUTANEOUS
  Administered 2012-03-18: 12 [IU] via SUBCUTANEOUS

## 2012-03-16 MED ORDER — MORPHINE SULFATE ER 30 MG PO TBCR *I*
30.0000 mg | ORAL_TABLET | Freq: Two times a day (BID) | ORAL | Status: DC | PRN
Start: 2012-03-16 — End: 2012-03-20
  Administered 2012-03-17 – 2012-03-19 (×5): 30 mg via ORAL
  Filled 2012-03-16 (×6): qty 1

## 2012-03-16 MED ORDER — ROSUVASTATIN CALCIUM 20 MG PO TABS *I*
10.0000 mg | ORAL_TABLET | Freq: Every day | ORAL | Status: DC
Start: 2012-03-17 — End: 2012-03-20
  Administered 2012-03-17 – 2012-03-19 (×3): 10 mg via ORAL
  Filled 2012-03-16 (×3): qty 1

## 2012-03-16 MED ORDER — AMLODIPINE BESYLATE 5 MG PO TABS *I*
10.0000 mg | ORAL_TABLET | Freq: Every day | ORAL | Status: DC
Start: 2012-03-16 — End: 2012-03-20
  Filled 2012-03-16 (×5): qty 2

## 2012-03-16 MED ORDER — DIPHENHYDRAMINE HCL 25 MG PO TABS *I*
50.0000 mg | ORAL_TABLET | Freq: Every evening | ORAL | Status: DC | PRN
Start: 2012-03-17 — End: 2012-03-17

## 2012-03-16 MED ORDER — PANTOPRAZOLE SODIUM 40 MG PO TBEC *I*
40.0000 mg | DELAYED_RELEASE_TABLET | Freq: Every morning | ORAL | Status: DC
Start: 2012-03-17 — End: 2012-03-20
  Administered 2012-03-17 – 2012-03-20 (×4): 40 mg via ORAL
  Filled 2012-03-16 (×4): qty 1

## 2012-03-16 MED ORDER — ZOLPIDEM TARTRATE 5 MG PO TABS *I*
5.0000 mg | ORAL_TABLET | Freq: Every evening | ORAL | Status: DC | PRN
Start: 2012-03-16 — End: 2012-03-17

## 2012-03-16 MED ORDER — ACETAMINOPHEN 500 MG PO TABS *I*
500.0000 mg | ORAL_TABLET | Freq: Three times a day (TID) | ORAL | Status: DC | PRN
Start: 2012-03-16 — End: 2012-03-20
  Filled 2012-03-16 (×4): qty 1

## 2012-03-16 MED ORDER — ASPIRIN 325 MG PO TABS *I*
325.0000 mg | ORAL_TABLET | Freq: Every day | ORAL | Status: DC
Start: 2012-03-17 — End: 2012-03-20
  Administered 2012-03-17 – 2012-03-20 (×4): 325 mg via ORAL
  Filled 2012-03-16 (×4): qty 1

## 2012-03-16 MED ORDER — ALBUTEROL SULFATE (2.5 MG/3ML) 0.083% IN NEBU *I*
7.5000 mg | INHALATION_SOLUTION | RESPIRATORY_TRACT | Status: DC
Start: 2012-03-16 — End: 2012-03-16
  Administered 2012-03-16: 7.5 mg via RESPIRATORY_TRACT
  Filled 2012-03-16: qty 9

## 2012-03-16 MED ORDER — IPRATROPIUM BROMIDE 0.02 % IN SOLN *I*
500.0000 ug | RESPIRATORY_TRACT | Status: DC
Start: 2012-03-16 — End: 2012-03-16
  Administered 2012-03-16: 500 ug via RESPIRATORY_TRACT
  Filled 2012-03-16: qty 2.5

## 2012-03-16 MED ORDER — PREDNISONE 20 MG PO TABS *I*
60.0000 mg | ORAL_TABLET | Freq: Every day | ORAL | Status: DC
Start: 2012-03-17 — End: 2012-03-20
  Administered 2012-03-17 – 2012-03-19 (×3): 60 mg via ORAL
  Filled 2012-03-16 (×3): qty 3

## 2012-03-16 MED ORDER — ENOXAPARIN SODIUM 40 MG/0.4ML IJ SOSY *I*
40.0000 mg | PREFILLED_SYRINGE | INTRAMUSCULAR | Status: DC
Start: 2012-03-17 — End: 2012-03-20
  Administered 2012-03-17 – 2012-03-20 (×4): 40 mg via SUBCUTANEOUS
  Filled 2012-03-16 (×4): qty 0.4

## 2012-03-16 MED ORDER — BUDESONIDE-FORMOTEROL FUMARATE 160-4.5 MCG/ACT IN AERO *I*
2.0000 | INHALATION_SPRAY | Freq: Two times a day (BID) | RESPIRATORY_TRACT | Status: DC
Start: 2012-03-16 — End: 2012-03-20
  Filled 2012-03-16 (×2): qty 6

## 2012-03-16 MED ORDER — TRAZODONE HCL 50 MG PO TABS *I*
200.0000 mg | ORAL_TABLET | Freq: Every evening | ORAL | Status: DC | PRN
Start: 2012-03-16 — End: 2012-03-17

## 2012-03-16 MED ORDER — PREDNISONE 20 MG PO TABS *I*
60.0000 mg | ORAL_TABLET | Freq: Once | ORAL | Status: AC
Start: 2012-03-16 — End: 2012-03-16
  Administered 2012-03-16: 60 mg via ORAL
  Filled 2012-03-16: qty 3

## 2012-03-16 MED ORDER — LORAZEPAM 2 MG/ML IJ SOLN *I*
INTRAMUSCULAR | Status: AC
Start: 2012-03-16 — End: 2012-03-16
  Filled 2012-03-16: qty 1

## 2012-03-16 MED ORDER — LORAZEPAM 2 MG/ML IJ SOLN *I*
1.0000 mg | Freq: Once | INTRAMUSCULAR | Status: AC
Start: 2012-03-16 — End: 2012-03-16

## 2012-03-16 MED ORDER — DULOXETINE HCL 20 MG PO CPEP *I*
40.0000 mg | DELAYED_RELEASE_CAPSULE | Freq: Every day | ORAL | Status: DC
Start: 2012-03-17 — End: 2012-03-17
  Administered 2012-03-17: 40 mg via ORAL
  Filled 2012-03-16: qty 2

## 2012-03-16 MED ORDER — INSULIN GLARGINE 100 UNIT/ML SC SOLN *WRAPPED*
70.0000 [IU] | Freq: Every evening | SUBCUTANEOUS | Status: DC
Start: 2012-03-16 — End: 2012-03-16

## 2012-03-16 MED ORDER — CLOPIDOGREL BISULFATE 75 MG PO TABS *I*
75.0000 mg | ORAL_TABLET | Freq: Every day | ORAL | Status: DC
Start: 2012-03-17 — End: 2012-03-20
  Administered 2012-03-17 – 2012-03-20 (×4): 75 mg via ORAL
  Filled 2012-03-16 (×4): qty 1

## 2012-03-16 MED ORDER — IBUPROFEN 400 MG PO TABS *I*
600.0000 mg | ORAL_TABLET | Freq: Three times a day (TID) | ORAL | Status: DC | PRN
Start: 2012-03-16 — End: 2012-03-16

## 2012-03-16 MED ORDER — GLUCOSE 40 % PO GEL *I*
15.0000 g | ORAL | Status: DC | PRN
Start: 2012-03-16 — End: 2012-03-20

## 2012-03-16 MED ORDER — AZITHROMYCIN 250 MG PO TABS *I*
500.0000 mg | ORAL_TABLET | Freq: Once | ORAL | Status: AC
Start: 2012-03-16 — End: 2012-03-17
  Administered 2012-03-17: 500 mg via ORAL
  Filled 2012-03-16: qty 2

## 2012-03-16 MED ORDER — DEXTROSE 50 % IV SOLN *I*
25.0000 g | INTRAVENOUS | Status: DC | PRN
Start: 2012-03-16 — End: 2012-03-20

## 2012-03-16 MED ORDER — AZITHROMYCIN 250 MG PO TABS *I*
250.0000 mg | ORAL_TABLET | Freq: Every day | ORAL | Status: DC
Start: 2012-03-17 — End: 2012-03-17

## 2012-03-16 MED ORDER — IPRATROPIUM-ALBUTEROL 0.5-2.5 MG/3ML IN SOLN *I*
3.0000 mL | Freq: Four times a day (QID) | RESPIRATORY_TRACT | Status: DC
Start: 2012-03-16 — End: 2012-03-20
  Administered 2012-03-16 – 2012-03-20 (×14): 3 mL via RESPIRATORY_TRACT
  Filled 2012-03-16 (×16): qty 3

## 2012-03-16 MED ORDER — LORAZEPAM 2 MG/ML IJ SOLN *I*
2.0000 mg | Freq: Once | INTRAMUSCULAR | Status: AC
Start: 2012-03-16 — End: 2012-03-16
  Filled 2012-03-16: qty 1

## 2012-03-16 MED ORDER — SODIUM CHLORIDE 0.9 % IV SOLN WRAPPED *I*
100.0000 mL/h | Status: DC
Start: 2012-03-16 — End: 2012-03-17
  Administered 2012-03-16: 125 mL/h via INTRAVENOUS
  Administered 2012-03-16: 100 mL/h via INTRAVENOUS

## 2012-03-16 MED ORDER — ALBUTEROL SULFATE (2.5 MG/3ML) 0.083% IN NEBU *I*
2.5000 mg | INHALATION_SOLUTION | RESPIRATORY_TRACT | Status: DC | PRN
Start: 2012-03-16 — End: 2012-03-20
  Administered 2012-03-17 – 2012-03-20 (×5): 2.5 mg via RESPIRATORY_TRACT
  Filled 2012-03-16 (×6): qty 3

## 2012-03-16 MED ORDER — GLUCAGON HCL (RDNA) 1 MG IJ SOLR *WRAPPED*
1.0000 mg | INTRAMUSCULAR | Status: DC | PRN
Start: 2012-03-16 — End: 2012-03-20

## 2012-03-16 MED ORDER — KETOROLAC TROMETHAMINE 30 MG/ML IJ SOLN *I*
30.0000 mg | Freq: Once | INTRAMUSCULAR | Status: AC
Start: 2012-03-16 — End: 2012-03-16

## 2012-03-16 MED ORDER — INSULIN GLARGINE 100 UNIT/ML SC SOLN *WRAPPED*
70.0000 [IU] | Freq: Every evening | SUBCUTANEOUS | Status: DC
Start: 2012-03-16 — End: 2012-03-17
  Administered 2012-03-16: 70 [IU] via SUBCUTANEOUS

## 2012-03-16 MED ORDER — KETOROLAC TROMETHAMINE 30 MG/ML IJ SOLN *I*
INTRAMUSCULAR | Status: AC
Start: 2012-03-16 — End: 2012-03-16
  Filled 2012-03-16: qty 1

## 2012-03-16 MED ADMIN — Amlodipine Besylate Tab 5 MG (Base Equivalent): 10 mg | ORAL | NDC 67877019810

## 2012-03-16 MED ADMIN — Losartan Potassium Tab 25 MG: 100 mg | ORAL | NDC 00093736410

## 2012-03-16 NOTE — ED Notes (Addendum)
Pt states she is here for SOB. States it started shortly after she got d/c from here yesterday. Denies using O2 at home. RA sat is 96%. Pt is alert x 4. Will continue to monitor.

## 2012-03-16 NOTE — ED Notes (Signed)
Pt to xray

## 2012-03-16 NOTE — ED Notes (Signed)
Pt states she has not yet taken her morning medication which includes BP medication.

## 2012-03-16 NOTE — ED Notes (Signed)
Pts ambulating O2 sat was 95-96%. Provider notified. Will continue to monitor.

## 2012-03-16 NOTE — ED Notes (Signed)
Pt pulled out her IV while using the BR. Provider aware. Ok for pt to be without IV access. Will continue to monitor.

## 2012-03-16 NOTE — Progress Notes (Signed)
Utilization Management    Level of Care Observation service as of the date 03/16/2012      Miner Koral A Kashina Mecum, RN     Pager: [phone]

## 2012-03-16 NOTE — Progress Notes (Addendum)
Nurse beside swallowing eval: Neg gag reflex with tongue depress and choked on sip of water    Notified dr. Sherrie George of bp 172.102.    bp meds ordered and given per order.     Pt tolerated thickened liquids.     Denies pain.  Productive wet cough after neb treatment.   ivf infusing without difficulty.     Pt more awake and more verbal for this writer currently at 2300 vs. Beginning of shift 1900.

## 2012-03-16 NOTE — H&P (Addendum)
Family Medicine Attending Inpatient Admission Note    Chief Complaint: shortness of breath, chest tightness    History of Present Illness:  Gloria Ray is a 57 y.o. female who was recently discharged from the ED OBS unit on 1/9 following a 2 day OBS stay for asthmatic bronchitis.  She returns today with reports of chest tightness that began to worsen yesterday (1 day following discharge).  Despite her home nebs and inhalers, she was unable to obtain sufficient relief.  On the night prior to admission she was unable to sleep well and continued to report chest tightness.  She denies fevers.  She has a mild productive cough which is unchanged from her post-discharge baseline.  She was not discharged on steroids, though she did receive them while in OBS for 2 days earlier this week.  She is also not taking antibiotics.  Her flu test was negative on her previous OBS stay.  She states that she feels significant anxiety, at home it was 10/10, now it's 9/10, and chest tightness is improved somewhat.  She also complains of urinary frequency, denies dysuria or hematuria.  And she does c/o headache, no vision changes.    Past Medical History:    1.  Asthma/COPD.   PFTs were ordered in December 2012 but no PFT results were found.  There was concern for OSA during a January 2013 admission.  She was asked to follow up for a sleep evaluation but it appears that she has not done so.  She takes Symbicort, duonebs and albuterol prn.  It is noted in her chart that she is likely GOLD 2 or 3.  She is a former smoker and does not require supplemental O2.    2.  CAD.  Pt  with hx of a previous MI in Jan 2009 and hx of an ischemic CVA in 2008. She had an echo in 2009 which showed concentric LVH with normal to hyperdynamic LVEF. Left atrial enlargement. No significant valvular abnormalities noted.  A 2010 Stress Echo, however, showed Partially transient inferior wall defect consistent with limited mixed ischemia/infarction in the RCA  distribution and preserved left ventricular systolic function by gated SPECT.   A resting ECG from last week shows lateral non-specific TW changes only.  HTN and dyslipidemia treated.  On clopidogrel 75.  She underwent PTCA with stent in the past, but the records from this were not found.    3.  DM2.  Pt takes lantus 80 units nightly and insulin lispro 30 TID with meals.  She checks her BGs at home.  Labs notable for the following:     Component Value Date    HA1C 8.3* 05/29/2011    HA1C 10.6* 03/27/2011    HA1C 10.5* 11/25/2010    MALBR 1.73 03/27/2011    CREAT 0.76 03/16/2012    LDLC 111 09/21/2011     4.  Dyslipidemia.  She takes Crestor.  Her last FLP was as follows:     Component Value Date    CHOL 189 09/21/2011    HDL 42 09/21/2011    LDLC 111 09/21/2011    TRIG 182* 09/21/2011    CHHDC 4.5 09/21/2011     5.  Hypertension.  On amlodipine, losartan, ASA 325. - missed all these meds today.    Past Surgical, Family and Social History:  She  has past surgical history that includes Coronary angioplasty with stent and Bunionectomy. Her family history includes Alcohol abuse in her father; Arthritis in her  sister; Coronary art dis in an other family member; Diabetes in her brother, mother, and another family member; Heart disease in an other family member; Hypertension in her mother and another family member; and Substance abuse in her brother and sister. She  reports that she quit smoking about 17 months ago. Her smoking use included Cigarettes. She has never used smokeless tobacco. She reports that she does not drink alcohol or use illicit drugs. His sexual activity history not on file.  Per her PCP, she is in transitional housing currently.    Medications & Allergies:      Allergen Reactions   . Keppra    . Lisinopril      Medications   . rosuvastatin (CRESTOR) 10 MG tablet   . traZODone (DESYREL) 100 MG tablet   . amLODIPine (NORVASC) 10 MG tablet   . albuterol (PROVENTIL, VENTOLIN, PROAIR HFA) 108 (90 BASE) MCG/ACT inhaler    . ipratropium-albuterol (DUONEB) 0.5-2.5 (3) MG/3ML nebulizer solution   . busPIRone (BUSPAR) 10 MG tablet   . budesonide-formoterol (SYMBICORT) 160-4.5 MCG/ACT inhaler   . morphine (MS CONTIN) 30 MG 12 hr tablet   . losartan (COZAAR) 50 MG tablet   . blood glucose test strip   . insulin glargine (LANTUS) 100 UNIT/ML injection vial   . insulin lispro (HUMALOG KWIKPEN) 100 UNIT/ML injection pen   . blood glucose monitor (ONE TOUCH ULTRA 2) kit   . polyethylene glycol (GLYCOLAX) powder   . dextrose (GLUTOSE) 40 % oral gel   . acetaminophen (TYLENOL) 500 mg tablet   . aspirin 325 MG tablet   . omeprazole (PRILOSEC) 20 MG capsule   . clopidogrel (PLAVIX) 75 MG tablet   . lancets (FREESTYLE)   . insulin pen needle (BD ULTRA-FINE PEN NEEDLE MINI) 31G X 5 MM   . starch (THICK-IT 2) powder packet   . lancets   . AeroChamber Z-Stat Plus/Small spacer device   . nebulizer device       Review of Systems:  Constitutional: Positive for chills and diaphoresis. Negative for fevers.   HENT: Negative for congestion.   Eyes: Negative for photophobia and visual disturbance.   Respiratory: Positive for cough, chest tightness, shortness of breath and wheezing.   Cardiovascular: Negative for chest pain and leg swelling.   Gastrointestinal: Positive for nausea. Negative for vomiting, abdominal pain and diarrhea.   Endocrine: Negative for polyuria.   Genitourinary: Negative for dysuria, urgency, frequency, flank pain and decreased urine volume.   Musculoskeletal: Negative for myalgias, joint swelling and arthralgias.   Skin: Negative for rash.   Allergic/Immunologic: Negative for immunocompromise.   Neurological: Negative for syncope, light-headedness and headaches.   Hematological: Does not bruise/bleed easily.   Psychiatric/Behavioral: Negative for behavioral problems, confusion and agitation.     OBJECTIVE    Physical Exam:  BP 185/98  Pulse 104  Temp(Src) 36.8 C (98.2 F) (Temporal)  Resp 16  Ht 1.524 m (5')  Wt 104.327 kg (230  lb)  BMI 44.92 kg/m2  SpO2 97%  O2 Device: None (Room air) (03/16/12 1343)    Constitutional: She is oriented to person, place, and time. She appears well-developed and well-nourished. In mild respiratory distress.   Head: Normocephalic and atraumatic.   Nose: Nose normal.   Mouth/Throat: Moist mucous membranes.  Eyes: EOM are normal. Pupils are equal, and round.   Neck: Normal range of motion. Neck supple. No JVD present. No tracheal deviation present.   Cardiovascular: Normal rate, regular rhythm, normal heart sounds and  intact distal pulses. Exam reveals no gallop and no friction rub.   No murmur heard.  No LE edema, calves symmetric   Pulmonary/Chest: No stridor. Tachypnea noted. Very poor air movement with scant wheezing.  No accessory muscle use noted.  Abdominal: Soft. Bowel sounds are normal. There is no tenderness. Notable for abdominal breathing.  Neurological: Falls asleep intermittently during history, then stands up to run to urinate.  Normal gait.  Skin: Skin is warm and dry. She is not diaphoretic.   Psychiatric: Thought content normal. Her mood appears anxious.  She is uncooperative with ED staff, but largely redirectable.    Lab Results:   All labs in the last 24 hours     CBC AND DIFFERENTIAL    Collection Time    03/16/12  8:58 AM       Result Value Range    WBC 6.7  4.0 - 10.0 THOU/uL    RBC 4.7  3.9 - 5.2 MIL/uL    Hemoglobin 11.9  11.2 - 15.7 g/dL    Hematocrit 37  34 - 45 %    MCV 80  79 - 95 fL    RDW 17.4 (*) 11.7 - 14.4 %    Platelets 319  160 - 370 THOU/uL    Seg Neut % 55.1  34.0 - 71.1 %    Lymphocyte % 38.0  19.3 - 51.7 %    Monocyte % 5.6  4.7 - 12.5 %    Eosinophil % 0.8  0.7 - 5.8 %    Basophil % 0.3  0.1 - 1.2 %    Neut # K/uL 3.7  1.6 - 6.1 THOU/uL    Lymph # K/uL 2.5  1.2 - 3.7 THOU/uL    Mono # K/uL 0.4  0.2 - 0.9 THOU/uL    Eos # K/uL 0.1  0.0 - 0.4 THOU/uL    Baso # K/uL 0.0  0.0 - 0.1 THOU/uL   BASIC METABOLIC PANEL    Collection Time    03/16/12  8:58 AM       Result Value  Range    Glucose 230 (*) 60 - 99 mg/dL    Sodium 191  478 - 295 mmol/L    Potassium 3.5  3.3 - 5.1 mmol/L    Chloride 98  96 - 108 mmol/L    CO2 28  20 - 28 mmol/L    Anion Gap 11  7 - 16    UN 12  6 - 20 mg/dL    Creatinine 6.21  3.08 - 0.95 mg/dL    GFR,Caucasian 88      GFR,Black 102      Calcium 9.1  8.6 - 10.2 mg/dL   TROPONIN T    Collection Time    03/16/12  8:58 AM       Result Value Range    Troponin T <0.01  0.00 - 0.02 ng/mL   NT-PRO BNP    Collection Time    03/16/12  8:58 AM       Result Value Range    NT-pro BNP 122  0 - 900 pg/mL       Radiology Impressions (last 3 days):  *Chest Standard Frontal And Lateral Views    03/16/2012  IMPRESSION:   No evidence of acute disease in the chest.   END OF REPORT       Assessment and Plan:    1. COPD exacerbation +/- anxiety: No hypoxia, +tachypnea, normal CXR.  Readmission following  ED obs stay precipitated by 3 days of SOB which was thought to be triggered by a viral syndrome.  BNP suggestive of COPD exacerbation (not CHF).  She was discharged without antibiotics or systemic steroids.  She will be readmitted because of risk for rapid decompensation.  We will begin systemic steroids and azithromycin and continue her outpatient symbicort, and give more frequent nebs.  I anticipate that she will be discharged with a steroid taper of some kind.  Would also consider inpt overnight pulse oximetry to assess for possible OSA.    2.  CAD, stable.  No evidence of ACS at this time.  First troponin negative.  Continue outpatient medications, including clopidogrel, ASA.  Also home BP meds given severely elevated BPs. Hx of CAD, consider repeat echo as no recent in our chart.  Can d/w PCP in am.    3.  Type 2 DM.  Not at goal, but no acute inpatient issues at this time.  Continue outpatient mgmt.    4.  Depression and anxiety.  Some behavioral issues in ED bay, compatible with her respiratory distress, but no evidence of acute issues.  Ativan 2mg  in ED (unclear if this was  actually given).  Will sign out overnight ok to give additional po ativan if needed.  Otherwise, continue outpatient duloxetine.    5.  DJD.  On outpatient long-acting morphine.  States she took 3 pills yesterday, wonders if that triggered her feelings today.  Will order BID prn. Tylenol prn.  Hold ibuprofen for now, pt on ASA, plavix and lovenox while here.    6. Urinary frequency.  Will check UA, may be 2/2 OSA and elevated ANP.    DVT ppx: She is on ASA and Plavix but these are not DVT ppx, will give lovenox  PCP will see tomorrow.    Code: Full, pt does not have HCP.  ELOS: 4 days    Edited by Duane Lope, MD, MPH  03/16/2012 9:41 PM     Initially created by York Pellant, MD, MPH, MS  Attending Physician, Family Medicine  Note created: 03/16/2012  at: 5:02 PM    ADDENDUM:  Pt failed bedside swallow with water.  Per PCP and med rec, she uses thickened liquids at home.  Currently has fallen asleep.  Nursing to do bedside swallow with honey, then nectar thickness, will call cross cover if she passes.  NPO overnight otherwise, IVF.  Given her unclear nutritional status, will do 70 of Lantus tonight and high dose SSI without baseline with BGs q 6 hours. PCP to reevaluate in am.  He points out she is getting steroids so may need higher management than normal.    Duane Lope, MD  03/16/2012  10:06 PM

## 2012-03-16 NOTE — ED Notes (Signed)
Pt returned from xray. Will continue to monitor.

## 2012-03-16 NOTE — ED Notes (Signed)
Spoke with Vickie on MOU to confirm ready bed, Altheia Shafran x [phone].

## 2012-03-16 NOTE — ED Notes (Signed)
Hx of asthma and seen here last week for same complaint. States developed sob and wheezing overnight. Used inhalers without relief. Neb tx given enroute.

## 2012-03-16 NOTE — ED Provider Notes (Addendum)
History     Chief Complaint   Patient presents with   . Shortness of Breath     HPI Comments: 57 y/o female with hx of COPD, recent hospital stay 1/7- 1/9 for COPD exacerbation, presents with chest tightness and SOB.  She reports symptoms started to worsen since yesterday, she tried home nebs/inhalers without relief. States she could not sleep last night due to symptoms. C/o chest tight ness "like there is a load on my chest".  No fevers. + hot sweats. Mild productive cough. Received steroids while in hospital, none for home per pt.  Not on abx.  No other URI symptoms. She had a neg flu test in hospital.      History provided by:  Patient      Past Medical History   Diagnosis Date   . Asthma    . COPD (chronic obstructive pulmonary disease)    . Coronary artery disease    . Diabetes mellitus    . Hypertension    . Ischemic Stroke 03/06/2006     Created by Conversion    . Prior Myocardial Infarction 03/19/2007     Created by Conversion    . Unspecified cerebral artery occlusion with cerebral infarction      2008   . Arthritis             Past Surgical History   Procedure Laterality Date   . Coronary angioplasty with stent placement     . Bunionectomy         Family History   Problem Relation Age of Onset   . Diabetes Other    . Heart disease Other    . Hypertension Other    . Coronary art dis Other    . Hypertension Mother    . Diabetes Mother    . Alcohol abuse Father    . Other Father      cirrhosis   . Arthritis Sister    . Substance abuse Sister    . Other Brother      HIV   . Substance abuse Brother    . Diabetes Brother          Social History      reports that she quit smoking about 17 months ago. Her smoking use included Cigarettes. She smoked 0.00 packs per day. She has never used smokeless tobacco. She reports that she does not drink alcohol or use illicit drugs. His sexual activity history not on file.    Living Situation    Questions Responses    Patient lives with Alone    Homeless No    Caregiver for  other family member No    External Services [facility]    Comment:  Visiting nurse     Employment Retired    Domestic Violence Risk No          Review of Systems   Review of Systems   Constitutional: Positive for chills and diaphoresis. Negative for fever.   HENT: Negative for congestion.    Eyes: Negative for photophobia and visual disturbance.   Respiratory: Positive for cough, chest tightness, shortness of breath and wheezing.    Cardiovascular: Negative for chest pain and leg swelling.   Gastrointestinal: Positive for nausea. Negative for vomiting, abdominal pain and diarrhea.   Endocrine: Negative for polyuria.   Genitourinary: Negative for dysuria, urgency, frequency, flank pain and decreased urine volume.   Musculoskeletal: Negative for myalgias, joint swelling and arthralgias.   Skin:  Negative for rash.   Allergic/Immunologic: Negative for immunocompromised state.   Neurological: Negative for syncope, light-headedness and headaches.   Hematological: Does not bruise/bleed easily.   Psychiatric/Behavioral: Negative for behavioral problems, confusion and agitation.       Physical Exam     ED Triage Vitals   BP Heart Rate Heart Rate(via Pulse Ox) Resp Temp Temp src SpO2 O2 Device O2 Flow Rate   03/16/12 0821 03/16/12 0821 -- 03/16/12 0821 03/16/12 0821 -- 03/16/12 0821 03/16/12 0821 --   170/98 mmHg 92  18 36.6 C (97.9 F)  100 % Aerosol mask       Weight           03/16/12 0821           104.327 kg (230 lb)               Physical Exam   Constitutional: She is oriented to person, place, and time. She appears well-developed and well-nourished. No distress.   HENT:   Head: Normocephalic and atraumatic.   Right Ear: Tympanic membrane normal.   Left Ear: Tympanic membrane normal.   Nose: Nose normal.   Mouth/Throat: Uvula is midline.   Eyes: EOM are normal. Pupils are equal, round, and reactive to light.   Neck: Normal range of motion. Neck supple. No JVD present. No tracheal deviation present.    Cardiovascular: Normal rate, regular rhythm, normal heart sounds and intact distal pulses.  Exam reveals no gallop and no friction rub.    No murmur heard.  No LE edema, calves symmetric   Pulmonary/Chest: No stridor. Tachypnea noted. She has wheezes ( scant).   Abdominal: Soft. Bowel sounds are normal. There is no tenderness.   Musculoskeletal: She exhibits no tenderness.   Neurological: She is alert and oriented to person, place, and time.   Skin: Skin is warm and dry. She is not diaphoretic.   Psychiatric: Her behavior is normal. Thought content normal. Her mood appears anxious.       Medical Decision Making      Amount and/or Complexity of Data Reviewed  Clinical lab tests: ordered  Tests in the radiology section of CPT: ordered        Initial Evaluation:  ED First Provider Contact    Date/Time Event User Comments    03/16/12 [phone] ED Provider First Contact BRIEN, MICHELLE A Initial Face to Face Provider Contact          Patient seen by me as above    Assessment:  57 y.o., female comes to the ED with chest tightness, Dyspnea    Differential Diagnosis includes copd exacerbation, pna, cardiac, anxiety              Plan: cxr, ekg, tele, labs, nebs prn, analgesia, monitor    Ekg: nsr 85 no st t changes    Labs Reviewed   CBC AND DIFFERENTIAL - Abnormal; Notable for the following:     RDW 17.4 (*)     All other components within normal limits   BASIC METABOLIC PANEL - Abnormal; Notable for the following:     Glucose 230 (*)     All other components within normal limits   HOLD BLUE   HOLD GREEN WITH GEL   HOLD LAVENDER   HOLD RED   HOLD SST   BLOOD BANK HOLD LAVENDER   TROPONIN T   NT-PRO BNP   HOLD EXTRA URINE     CHEST FRONTAL & LAT  Final Result: IMPRESSION:          No evidence of acute disease in the chest.         END OF REPORT     Medications   albuterol (PROVENTIL) nebulization 7.5 mg (0 mg Nebulization Stopped 03/16/12 1228)     And   ipratropium (ATROVENT) 0.02 % nebulizer solution 500 mcg (0 mcg  Nebulization Stopped 03/16/12 1228)   ketorolac (TORADOL) injection 30 mg (30 mg Intravenous New Syringe/Cartridge - ED 03/16/12 0857)   predniSONE (DELTASONE) tablet 60 mg (60 mg Oral Given 03/16/12 1005)   LORazepam (ATIVAN) injection 1 mg (1 mg Intravenous New Syringe/Cartridge - ED 03/16/12 1025)       Pt still c/o dyspnea despite multiple rounds of nebs/meds, will hospitalize, Family Med Dr. Leonette Monarch agreeable to admission  [facility] [place]    Gardiner Ramus, [place]  03/16/12 0848    Gardiner Ramus, PA  03/16/12 1302    APP Review:     I had face-to-face interaction with the patient today, 03/16/2012 at 0930.      I was asked by APP to see this patient due to diagnostic uncertainty and for assistance with physical exam findings.    I have reviewed and agree with the above documentation and, in addition, the history is notable for recent obs admission for COPD with continued SOB and chest heaviness, exam is notable for wheezing, tachypnea and anxiety and patient is at risk for COPD exacerbation, symptoms not c/w ACS however, and our plan is attempted to improve symptoms and d/c in ED, but continued tachypnea and SOB, will need obs level admission.         Author Delton Coombes, MD      Delton Coombes, MD  03/16/12 [phone]

## 2012-03-17 ENCOUNTER — Inpatient Hospital Stay
Admit: 2012-03-17 | Disposition: A | Discharge status: Home / Self Care | Payer: Self-pay | Source: Ambulatory Visit | Attending: Internal Medicine | Admitting: Internal Medicine

## 2012-03-17 LAB — BLOOD CULTURE
Bacterial Blood Culture: 0
Bacterial Blood Culture: 0

## 2012-03-17 LAB — POCT GLUCOSE
Glucose POCT: 205 mg/dL — ABNORMAL HIGH (ref 60–99)
Glucose POCT: 106 mg/dL — ABNORMAL HIGH (ref 60–99)
Glucose POCT: 234 mg/dL — ABNORMAL HIGH (ref 60–99)
Glucose POCT: 305 mg/dL — ABNORMAL HIGH (ref 60–99)
Glucose POCT: 215 mg/dL — ABNORMAL HIGH (ref 60–99)

## 2012-03-17 LAB — URINALYSIS WITH REFLEX TO MICROSCOPIC
Blood,UA: NEGATIVE
Glucose,UA: 300 mg/dL — AB
Leuk Esterase,UA: NEGATIVE
Nitrite,UA: NEGATIVE
Protein,UA: 75 mg/dL — AB
Specific Gravity,UA: 1.022 (ref 1.001–1.030)
pH,UA: 7 (ref 5.0–8.0)

## 2012-03-17 LAB — LIPID PANEL
Chol/HDL Ratio: 2.7 (ref 0.0–4.4)
Cholesterol: 208 mg/dL — AB
HDL: 76 mg/dL
LDL Calculated: 112 mg/dL
Non HDL Cholesterol: 132 mg/dL
Triglycerides: 102 mg/dL

## 2012-03-17 LAB — CBC AND DIFFERENTIAL
Baso # K/uL: 0 10*3/uL (ref 0.0–0.1)
Basophil %: 0.1 % (ref 0.1–1.2)
Eos # K/uL: 0 10*3/uL (ref 0.0–0.4)
Eosinophil %: 0.5 % — ABNORMAL LOW (ref 0.7–5.8)
Hematocrit: 38 % (ref 34–45)
Hemoglobin: 11.9 g/dL (ref 11.2–15.7)
Lymph # K/uL: 2.9 10*3/uL (ref 1.2–3.7)
Lymphocyte %: 35 % (ref 19.3–51.7)
MCV: 79 fL (ref 79–95)
Mono # K/uL: 0.8 10*3/uL (ref 0.2–0.9)
Monocyte %: 9.4 % (ref 4.7–12.5)
Neut # K/uL: 4.5 10*3/uL (ref 1.6–6.1)
Platelets: 360 10*3/uL (ref 160–370)
RBC: 4.8 MIL/uL (ref 3.9–5.2)
RDW: 17.6 % — ABNORMAL HIGH (ref 11.7–14.4)
Seg Neut %: 54.5 % (ref 34.0–71.1)
WBC: 8.2 10*3/uL (ref 4.0–10.0)

## 2012-03-17 LAB — BASIC METABOLIC PANEL
Anion Gap: 11 (ref 7–16)
CO2: 29 mmol/L — ABNORMAL HIGH (ref 20–28)
Calcium: 9.1 mg/dL (ref 8.6–10.2)
Chloride: 101 mmol/L (ref 96–108)
Creatinine: 0.82 mg/dL (ref 0.51–0.95)
GFR,Black: 93 *
GFR,Caucasian: 81 *
Glucose: 117 mg/dL — ABNORMAL HIGH (ref 60–99)
Lab: 18 mg/dL (ref 6–20)
Potassium: 3.6 mmol/L (ref 3.3–5.1)
Sodium: 141 mmol/L (ref 133–145)

## 2012-03-17 LAB — CK ISOENZYMES
CK: 230 U/L — ABNORMAL HIGH (ref 34–145)
Mass CKMB: 4.6 ng/mL — ABNORMAL HIGH (ref 0.0–2.9)
Relative Index: 2 % (ref 0.0–5.0)

## 2012-03-17 LAB — MICROALBUMIN, URINE, RANDOM

## 2012-03-17 LAB — URINE MICROSCOPIC (IQ200)

## 2012-03-17 LAB — TROPONIN T: Troponin T: <0.01 ng/mL (ref 0.00–0.02)

## 2012-03-17 MED ORDER — DULOXETINE HCL 20 MG PO CPEP *I*
20.0000 mg | DELAYED_RELEASE_CAPSULE | Freq: Two times a day (BID) | ORAL | Status: DC
Start: 2012-03-17 — End: 2012-03-17

## 2012-03-17 MED ORDER — HYDRALAZINE HCL 20 MG/ML IJ SOLN *I*
10.0000 mg | Freq: Once | INTRAMUSCULAR | Status: AC
Start: 2012-03-17 — End: 2012-03-17
  Administered 2012-03-17: 10 mg via INTRAVENOUS

## 2012-03-17 MED ORDER — TUBERCULIN PPD 5 UNIT/0.1ML ID SOLN *I*
5.0000 [IU] | Freq: Once | INTRADERMAL | Status: AC
Start: 2012-03-17 — End: 2012-03-17
  Administered 2012-03-17: 5 [IU] via INTRADERMAL

## 2012-03-17 MED ORDER — INSULIN GLARGINE 100 UNIT/ML SC SOLN *WRAPPED*
80.0000 [IU] | Freq: Every evening | SUBCUTANEOUS | Status: DC
Start: 2012-03-17 — End: 2012-03-19
  Administered 2012-03-17 – 2012-03-18 (×2): 80 [IU] via SUBCUTANEOUS

## 2012-03-17 MED ORDER — METOPROLOL TARTRATE 50 MG PO TABS *I*
50.0000 mg | ORAL_TABLET | Freq: Two times a day (BID) | ORAL | Status: DC
Start: 2012-03-17 — End: 2012-03-20
  Administered 2012-03-17 – 2012-03-20 (×6): 50 mg via ORAL
  Filled 2012-03-17 (×6): qty 1

## 2012-03-17 MED ORDER — BUSPIRONE HCL 5 MG PO TABS *I*
10.0000 mg | ORAL_TABLET | Freq: Two times a day (BID) | ORAL | Status: DC
Start: 2012-03-17 — End: 2012-03-18
  Administered 2012-03-17 – 2012-03-18 (×2): 10 mg via ORAL
  Filled 2012-03-17 (×2): qty 2

## 2012-03-17 MED ORDER — HYDRALAZINE HCL 20 MG/ML IJ SOLN *I*
20.0000 mg | Freq: Once | INTRAMUSCULAR | Status: AC
Start: 2012-03-17 — End: 2012-03-17
  Administered 2012-03-17: 20 mg via INTRAVENOUS
  Filled 2012-03-17: qty 1

## 2012-03-17 MED ORDER — TRAZODONE HCL 50 MG PO TABS *I*
200.0000 mg | ORAL_TABLET | Freq: Every evening | ORAL | Status: DC
Start: 2012-03-17 — End: 2012-03-20
  Administered 2012-03-17 – 2012-03-19 (×3): 200 mg via ORAL
  Filled 2012-03-17 (×3): qty 4

## 2012-03-17 MED ORDER — HYDRALAZINE HCL 20 MG/ML IJ SOLN *I*
INTRAMUSCULAR | Status: AC
Start: 2012-03-17 — End: 2012-03-17
  Filled 2012-03-17: qty 1

## 2012-03-17 MED ORDER — DULOXETINE HCL 20 MG PO CPEP *I*
20.0000 mg | DELAYED_RELEASE_CAPSULE | Freq: Every day | ORAL | Status: AC
Start: 2012-03-18 — End: 2012-03-19
  Administered 2012-03-18 – 2012-03-19 (×2): 20 mg via ORAL
  Filled 2012-03-17 (×2): qty 1

## 2012-03-17 MED ORDER — METOPROLOL TARTRATE 25 MG PO TABS *I*
25.0000 mg | ORAL_TABLET | Freq: Two times a day (BID) | ORAL | Status: DC
Start: 2012-03-17 — End: 2012-03-17
  Administered 2012-03-17: 25 mg via ORAL
  Filled 2012-03-17: qty 1

## 2012-03-17 MED ORDER — AZITHROMYCIN 500MG IN 280ML D5W *I*
500.0000 mg | INTRAVENOUS | Status: AC
Start: 2012-03-18 — End: 2012-03-19
  Administered 2012-03-18 – 2012-03-19 (×2): 500 mg via INTRAVENOUS
  Filled 2012-03-17 (×2): qty 250

## 2012-03-17 MED ADMIN — Acetaminophen Tab 500 MG: 500 mg | ORAL | NDC 00904198861

## 2012-03-17 MED ADMIN — Budesonide-Formoterol Fumarate Dihyd Aerosol 160-4.5 MCG/ACT: 2 | RESPIRATORY_TRACT | NDC 00186037028

## 2012-03-17 MED ADMIN — Amlodipine Besylate Tab 5 MG (Base Equivalent): 10 mg | ORAL | NDC 67877019810

## 2012-03-17 MED ADMIN — Losartan Potassium Tab 25 MG: 100 mg | ORAL | NDC 00093736410

## 2012-03-17 NOTE — Progress Notes (Signed)
Utilization Management    Level of Care Inpatient as of the date 03/17/2012      Ruffin Frederick, RN     Pager: [phone]

## 2012-03-17 NOTE — Progress Notes (Addendum)
Paged SOS for new IV as pt pulled IV out. Attempt made by nursing staff, but pt agitation and movement made placing IV and getting labs difficult.  Pt educated that she needs new IV placed as she has fluids running and BP has been running high.  Will wait for SOS to respond and administer requested anti-anxiety meds when due.  0730 paged PA crosscover regarding add-on for [facility] lab that was requested by Dr. Juliane Poot.  Will attempt to get urine sample when pt has to void again and monitor pt for changes.  New IV was placed and is wrapped with gauze to prevent pulling.

## 2012-03-17 NOTE — Progress Notes (Addendum)
CK and Trops were ordered for Pt.  Paged Dr. Juliane Poot to see if he wanted EKG done. Will await orders and monitor pt for changes. Dr. Juliane Poot called back and stated that no EKG necessary.

## 2012-03-17 NOTE — Progress Notes (Addendum)
Family Medicine R2 Progress Note                                                Significant 24 Hour Events       Hypertensive on admission and overnight, given hydralazine   Pulled out IV   Given reduced dose of Lantus was NPO for failed bedside swallow, now dysphagia 3    Subjective      Patient reports extreme tightness of chest, asks if she is going to die.  Notes that there is a new group of residents at shelter where she is living, they are smoking outside and smell of smoke, some have even been caught smoking inside.  She feels she was discharged from previous OBS in ED too soon.  She requests the writer call [facility] to let them know she is hospitalized, possibly for days, and to request they hold a bed for her.  The staff member answering the phone says she will document, but cannot guarantee holding of a bed.  Reports she is waiting on PPD for transition to apartment, otherwise ready to go.      Objective      Physical Exam  Recent vital signs reviewed and notable for:      Afebrile    Hypertension: 180s/100s after hydralazine overnight; 190/120 this AM    Tachycardia: 94-106    Tachypnea: RR up to 24/min ON    Maintaining normal oxygenation on RA      General Constitutional: Tired appearing, sitting in reclining chair, mild distress.  Wearing incontinence briefs.  HEENT: Atraumatic, EOM normal, MMM  Cardiovascular: Elevated rate, regular rhythm, S1, S2.  No murmurs.    Pulmonary: Mild scattered wheezes, aerating well.  Not using accessory breathing.  Gastrointestinal: Soft, non-tender, obese abdomen.   Musculoskeletal: Moving all extremities, no edema  Neurologic: Alter, oriented x3, speech is baseline.   Skin: No rashes, purpura or petechiae on exposed surfaces   Psych: Mood is moderately anxious.  Affect congruent with mood.      Recent Lab, Micro, and Imaging Studies   Personally reviewed and notable for:    CBC: No leukocytosis, WBC 8.2.  BMP: Normal Cr, 0.82    TropT 1/11: negative x1    CXR 1/11:  Normal    Urinalysis 1/11: no leuks or nitrite; protein 75; glucose 300, ketones 2+      Assessment and Plan     Chest tightness and tachypnea in a 57yo female with a history of COPD, DM, CAD, ischemic stroke (2008), MI (2009), DJD of knees.    1. COPD exacerbation +/- anxiety: No hypoxia, +tachypnea, normal CXR. Readmission following ED obs stay precipitated by 3 days of SOB which was thought to be triggered by a viral syndrome. BNP suggestive of COPD exacerbation (not CHF). She was discharged without antibiotics or systemic steroids. She is readmitted because of risk for rapid decompensation.   S/p methylprednisolone in ED, continue steroid treatment with PO prednisone 60mg  daily   S/p 500mg  Azithromycin in ED.  Continue azithromycin 500mg  q24h IV x2 for total 1500mg    Continue Duoneb 4x daily with albuterol nebs q4h PRN   Continue home Symbicort 160-4.5   Overnight pulse ox for possible OSA    2. CAD, stable. No evidence of ACS at this time. First troponin negative. Continue outpatient medications, including clopidogrel,  ASA.    Home ASA, Plavix   Continue home amlodipine 10mg  daily and losartan 100mg  daily   Add metoprolol 25mg , increase to 50mg  BID for rate control (convert to XR daily on d/c)   10-20mg  hydralazine PRN for BP>180/110   Echocardiogram    3. Type 2 DM. Last HbA1c 8.3 (05/29/2011) not at goal for glycemic control.    Check HbA1c to estimate control, urine micralbumin, lipid panel   Receiving steroids, monitor BGs 4x daily qac and qhs   Home Lantus dose of 80 Units qhs, titrate up for uncontrolled BGs associated with steroids   Humalog sliding scale    4. Depression and anxiety.  Recent issues with insomnia, anxiety vs poor pulmonary status with recent COPD exacerbations.   Continue home buspirone 10mg  BID, titrate up during admission   Continue home trazodone, 200mg  qhs   Titrating down and d/c duloxetine as patient has complained of insomnia and anxiety since starting.  Consider  back to bupropion for mood.    5. DJD. On outpatient long-acting morphine. States she took 3 pills day prior to admission, wonders if that triggered her feelings today.    Home morphine 30mg  BID prn.    1000mg  Tylenol TID prn    6. Urinary frequency.    D/c IVF, monitor for continued frequency    F: d/c IVF, restart for poor intake or NPO  E: no need for daily BMP, no evidence of renal injury from HTN  N: diabetic diet, nectar thick liquids- uses Thick-It starch at home to thicken    DVT ppx: She is on ASA and Plavix but these are not DVT ppx, will give lovenox     Code: Full, pt does not have HCP.     ELOS: 3-4 days             [name] MD, PhD  Family Medicine, R2  03/17/2012 6:37 AM          Family Medicine Attending Physician Attestation    I saw and evaluated the patient. I agree with the resident's/fellow's findings and plan of care as documented above.  I reviewed and updated medications as appropriate.    York Pellant, MD, MPH, MS  Family Medicine Attending - Continuity Service    Signed at 8:02 PM on 03/17/2012.

## 2012-03-17 NOTE — Progress Notes (Addendum)
Upon initial assessment found pt with BP at 190/120; complains of SOB; no chest pain. BP meds given. Paged [name], MD. Will recheck BP in 20-30 minutes and continue to monitor.     Rechecked pt's BP in an hour:  160/80. Wait for the pharmacy to verify the meds and administer. Will continue to monitor.

## 2012-03-17 NOTE — Progress Notes (Addendum)
This Clinical research associate paged Kayren Eaves, [place] at [phone] regarding pt BP at 184/112 .  Awaiting reply.      Sunny Schlein, RN      BP rechecked at 0237 to be 190/114.  Administered 10 mg of Apresoline IVPB.  Will continue to monitor.    Sunny Schlein, RN      Pt restless and c/o of anxiety.  This Clinical research associate paged Kayren Eaves, [place] at [phone] Regarding to place order for anti-anxiety medication.   Awaiting reply.    Sunny Schlein, RN      Pt BP at [phone] is 184/98.  This writer notified Kayren Eaves, [place] at [phone] of this finding. Will continue to monitor.    Sunny Schlein, RN

## 2012-03-18 LAB — MICROALBUMIN, URINE, RANDOM
Creatinine,UR: 54 mg/dL (ref 20–300)
Microalb/Creat Ratio: 878 mg MA/g CR — ABNORMAL HIGH (ref 0.0–29.9)
Microalbumin,UR: 47.41 mg/dL

## 2012-03-18 LAB — POCT GLUCOSE
Glucose POCT: 181 mg/dL — ABNORMAL HIGH (ref 60–99)
Glucose POCT: 182 mg/dL — ABNORMAL HIGH (ref 60–99)
Glucose POCT: 310 mg/dL — ABNORMAL HIGH (ref 60–99)
Glucose POCT: 336 mg/dL — ABNORMAL HIGH (ref 60–99)
Glucose POCT: 399 mg/dL — ABNORMAL HIGH (ref 60–99)
Glucose POCT: 94 mg/dL (ref 60–99)

## 2012-03-18 LAB — HEMOGLOBIN A1C: Hemoglobin A1C: 10.7 % — ABNORMAL HIGH (ref 4.0–6.0)

## 2012-03-18 MED ORDER — BUSPIRONE HCL 5 MG PO TABS *I*
15.0000 mg | ORAL_TABLET | Freq: Two times a day (BID) | ORAL | Status: DC
Start: 2012-03-18 — End: 2012-03-19
  Administered 2012-03-18 – 2012-03-19 (×2): 15 mg via ORAL
  Filled 2012-03-18 (×2): qty 3

## 2012-03-18 MED ORDER — INSULIN LISPRO (HUMAN) 100 UNIT/ML IJ/SC SOLN *WRAPPED*
0.0000 [IU] | Freq: Four times a day (QID) | SUBCUTANEOUS | Status: AC
Start: 2012-03-18 — End: 2012-03-20
  Administered 2012-03-18: 12 [IU] via SUBCUTANEOUS
  Administered 2012-03-18: 14 [IU] via SUBCUTANEOUS
  Administered 2012-03-19 (×2): 22 [IU] via SUBCUTANEOUS
  Administered 2012-03-19: 4 [IU] via SUBCUTANEOUS
  Administered 2012-03-19: 6 [IU] via SUBCUTANEOUS
  Administered 2012-03-20: 10 [IU] via SUBCUTANEOUS

## 2012-03-18 MED ORDER — BUSPIRONE HCL 10 MG PO TABS *I*
10.0000 mg | ORAL_TABLET | Freq: Two times a day (BID) | ORAL | Status: DC
Start: 2012-03-14 — End: 2012-03-20

## 2012-03-18 MED ADMIN — Acetaminophen Tab 500 MG: 500 mg | ORAL | NDC 00904198861

## 2012-03-18 MED ADMIN — Budesonide-Formoterol Fumarate Dihyd Aerosol 160-4.5 MCG/ACT: 2 | RESPIRATORY_TRACT | NDC 00186037028

## 2012-03-18 MED ADMIN — Amlodipine Besylate Tab 5 MG (Base Equivalent): 10 mg | ORAL | NDC 67877019810

## 2012-03-18 MED ADMIN — Losartan Potassium Tab 25 MG: 100 mg | ORAL | NDC 00093736410

## 2012-03-18 NOTE — Progress Notes (Signed)
Clinical Swallowing Evaluation   03/18/12 1001   Referral Reason   Referral Reason Assess safety for P.O. diet   BEDSIDE SWALLOW COMMENTS   History of Dysphagia Comments Unclear history. Pt reports taking nectar thickened liquids at home after her CVA. Chart review revealed one consult in July 2012 recommending nectar thickened liquids. Pt reports that she is not consistent with these at home.   ASSESSMENT   Cognition Poor attention/concentration;Follows commands;Impulsive   Alertness level Fully alert   Ability to follow directions Simple   Head and neck control Independent   Secretion Management Pink, moist   Reflexive cough Adequate   Voluntary Cough Adequate   Reflexive swallow Present   Voluntary swallow Present   Respiratory Status RA   Communication Status Dysarthria   Oral Mechanism   Oral Mechanism Tested Not impaired   PO TRIAL CONSISTENCIES   P.O. Trials Tested X   Oral Thin Liquid Not impaired   Pharyngeal Thin Liquids Reflexive Cough (comment);Change in respiratory pattern (comment)   Oral Nectar Not impaired   Pharyngeal nectar Throat clear (comment)   Oral Hard Solid Not Impaired   Pharyngeal Hard Solid No overt s/s of aspiration   Aspiration Precautions/Feeding Guidelines   Risk for Aspiration Mild  (Thin liquids.)   Feeding Guidelines Upright 90 degrees;Small bites and sips;Single sips;Slow rate   Impressions   Severity Mild   Type of Dysphagia Pharyngeal dyphagia   Summary Mild pharyngeal dysphagia appears to be her baseline. Pt does not report compliancy with thickened liquids in the past. Doubt she will be compliant in the future. Recommend to observe at a full meal and determine safest/least restrictive diet. Pt may benefit from Modified Barium Swallow study prior to d/c to better define swallow function and safest/least restrictive diet.   Diet Recommendations   Diet Consistency Recommendation Regular Diet   Liquids Recommendations Nectar thick liquids   Studies/Therapy Recommendations    Therapy Recommendations Dysphagia therapy   Frequency of Services 2-4x/wk   Objective Studies/ Recommendations  (Pending)   Time Calculation   Start Time 0945   Stop Time 1000   Time Calculation 15   Studies/Therapy Recommendations   Patient Will Benefit From Home SLP services

## 2012-03-18 NOTE — Progress Notes (Signed)
Pt wanted staff to know that if she is D/C'ed on Wednesday she is going to be trying to arrange to have a bed ready for her, so she would need to be D/C'ed before noon on Wednesday.

## 2012-03-18 NOTE — Progress Notes (Signed)
SW met with pt per her request. Pt stated that she is trying to move into [facility] and her PCP, Dr. Juliane Poot had completed some paperwork and faxed it back to [facility] for her. However there was an error on the paperwork that pt says the doctor needs to correct. Pt stated that she did call her PCP and left a message, pt is anxious that paperwork will not be corrected and faxed back to [facility]. SW informed her that if she already left a message that it was not necessary for SW to leave one as well. SW contacted pt's case Production designer, theatre/television/film at [employer] [phone]. SW L/M inquiring as to if pt can return when she is ready for d/c. Also inquired about pt's status with [facility]. SW asked for a returned call. SW will continue to follow for d/c planning.    Rosine Door, LMSW  03/18/2012  12:06 PM

## 2012-03-18 NOTE — Progress Notes (Addendum)
Family Medicine R2 Progress Note                                                Significant 24 Hour Events       No acute events overnight    Subjective      Concerned about housing, but overall feeling better.  Less tightness in chest, thinks medicines are working.      Objective      Physical Exam  Recent vital signs reviewed and notable for:      Afebrile    HR trending down, last 67    BP trending down, low 122/82      General Constitutional: Tired appearing, sitting in reclining chair.  No acute distress.  HEENT: Atraumatic, EOM normal, MMM  Cardiovascular: Elevated rate, regular rhythm, S1, S2.  No murmurs.    Pulmonary: Unlabored breaths, shallow but near baseline.  CTAB.  Gastrointestinal: Soft, non-tender, obese abdomen.   Musculoskeletal: Moving all extremities, no edema  Neurologic: Alter, oriented x3, speech is baseline.   Skin: No rashes, purpura or petechiae on exposed surfaces   Psych: Mood is moderately anxious.  Affect congruent with mood.      Recent Lab, Micro, and Imaging Studies   Personally reviewed and notable for:    BG fasting 94.  181-305  CBC: No leukocytosis, WBC 8.2.  BMP: Normal Cr, 0.82    TropT 1/11: negative x1    CXR 1/11: Normal    Urinalysis 1/11: no leuks or nitrite; protein 75; glucose 300, ketones 2+      Assessment and Plan     Chest tightness and tachypnea in a 57yo female with a history of COPD, DM, CAD, ischemic stroke (2008), MI (2009), DJD of knees.    1. COPD exacerbation +/- anxiety: No hypoxia, +tachypnea, normal CXR. Readmission following ED obs stay precipitated by 3 days of SOB which was thought to be triggered by a viral syndrome. BNP suggestive of COPD exacerbation (not CHF). She was discharged without antibiotics or systemic steroids. She is readmitted because of risk for rapid decompensation.   S/p methylprednisolone in ED, continue steroid treatment with PO prednisone 60mg  daily   S/p 500mg  Azithromycin in ED.  Continue azithromycin 500mg  q24h IV x2 for  total 1500mg    Continue Duoneb 4x daily with albuterol nebs q4h PRN   Continue home Symbicort 160-4.5   Overnight pulse ox shows desat, consider home O2    2. CAD, stable. No evidence of ACS at this time. First troponin negative. Continue outpatient medications, including clopidogrel, ASA.    Home ASA, Plavix   Continue home amlodipine 10mg  daily and losartan 100mg  daily   Add metoprolol 25mg , increase to 50mg  BID for rate control (convert to XR daily on d/c)    hydralazine PRN for BP>180/110   Echocardiogram pending    3. Type 2 DM. Last HbA1c 8.3 (05/29/2011) not at goal for glycemic control.    HbA1c to estimate control, urine micralbumin, lipid panel have not been collected   Receiving steroids, monitor BGs 4x daily qac and qhs   Home Lantus dose of 80 Units qhs, titrate up for uncontrolled BGs associated with steroids   Humalog sliding scale    4. Depression and anxiety.  Recent issues with insomnia, anxiety vs poor pulmonary status with recent COPD exacerbations.   Increase buspirone  15mg  BID, titrate up during admission   Continue home trazodone, 200mg  qhs   Titrating down and d/c duloxetine as patient has complained of insomnia and anxiety since starting.  Consider back to bupropion for mood.    5. DJD. On outpatient long-acting morphine. States she took 3 pills day prior to admission.    Home morphine 30mg  BID prn.    1000mg  Tylenol TID prn    F: off IVF, restart for poor intake or NPO  E: no need for daily BMP, no evidence of renal injury from HTN  N: diabetic diet, nectar thick liquids- uses Thick-It starch at home to thicken    DVT ppx: She is on ASA and Plavix but these are not DVT ppx, will give lovenox     Code: Full, pt does not have HCP.     ELOS: 3-4 days             [name] MD, PhD  Family Medicine, R2  03/18/2012 7:00 AM        Family Medicine Attending Physician Attestation for 03/18/12    I saw and evaluated the patient. I agree with the resident's/fellow's findings and  plan of care as documented above.  I reviewed and updated medications as appropriate.    York Pellant, MD, MPH, MS  Family Medicine Attending - Continuity Service    Signed at 8:23 AM on 03/19/2012.

## 2012-03-19 LAB — POCT GLUCOSE
Glucose POCT: 327 mg/dL — ABNORMAL HIGH (ref 60–99)
Glucose POCT: 154 mg/dL — ABNORMAL HIGH (ref 60–99)
Glucose POCT: 214 mg/dL — ABNORMAL HIGH (ref 60–99)
Glucose POCT: 320 mg/dL — ABNORMAL HIGH (ref 60–99)
Glucose POCT: 338 mg/dL — ABNORMAL HIGH (ref 60–99)

## 2012-03-19 MED ORDER — ALBUTEROL SULFATE (2.5 MG/3ML) 0.083% IN NEBU *I*
2.5000 mg | INHALATION_SOLUTION | Freq: Once | RESPIRATORY_TRACT | Status: AC
Start: 2012-03-19 — End: 2012-03-19
  Administered 2012-03-19: 2.5 mg via RESPIRATORY_TRACT

## 2012-03-19 MED ORDER — BUSPIRONE HCL 5 MG PO TABS *I*
20.0000 mg | ORAL_TABLET | Freq: Two times a day (BID) | ORAL | Status: DC
Start: 2012-03-19 — End: 2012-03-20
  Administered 2012-03-19 – 2012-03-20 (×2): 20 mg via ORAL
  Filled 2012-03-19 (×2): qty 4

## 2012-03-19 MED ORDER — INSULIN GLARGINE 100 UNIT/ML SC SOLN *WRAPPED*
85.0000 [IU] | Freq: Every evening | SUBCUTANEOUS | Status: DC
Start: 2012-03-19 — End: 2012-03-20
  Administered 2012-03-19: 85 [IU] via SUBCUTANEOUS

## 2012-03-19 MED ADMIN — Losartan Potassium Tab 25 MG: 100 mg | ORAL | NDC 00093736410

## 2012-03-19 MED ADMIN — Budesonide-Formoterol Fumarate Dihyd Aerosol 160-4.5 MCG/ACT: 2 | RESPIRATORY_TRACT | NDC 00186037028

## 2012-03-19 MED ADMIN — Amlodipine Besylate Tab 5 MG (Base Equivalent): 10 mg | ORAL | NDC 67877019810

## 2012-03-19 MED ADMIN — Acetaminophen Tab 500 MG: 500 mg | ORAL | NDC 00904198861

## 2012-03-19 NOTE — Progress Notes (Addendum)
Family Medicine R2 Progress Note                                                Significant 24 Hour Events    HOD 3   No acute events overnight      Subjective      Feeling better, less anxious, breathing easier.  May be able to return to [facility] tomorrow morning per patient, not sure about today.  Denies SOB, CP, palpitations, nausea.       Objective      Physical Exam  Recent vital signs reviewed and notable for:  BP WNL, well controlled.  Maintaining sat on room air.  BP 124/60  Pulse 78  Temp(Src) 36.7 C (98.1 F) (Temporal)  Resp 18  Ht 1.524 m (5')  Wt 104 kg (229 lb 4.5 oz)  BMI 44.78 kg/m2  SpO2 96%       General Constitutional: Tired appearing, sitting in reclining chair. No acute distress.   HEENT: Atraumatic, EOM normal, MMM   Cardiovascular: Elevated rate, regular rhythm, S1, S2. No murmurs.   Pulmonary: Unlabored breaths, shallow but near baseline. CTAB.   Gastrointestinal: Soft, non-tender, obese abdomen.   Musculoskeletal: Moving all extremities, no edema   Neurologic: Alter, oriented x3, speech is baseline.   Skin: No rashes, purpura or petechiae on exposed surfaces   Psych: Mood is moderately anxious. Affect congruent with mood.       Recent Lab, Micro, and Imaging Studies   Personally reviewed and notable for:      HbA1c 10.7, estimated daily average glucose is 260    Microalbumin 47.41, urine Cr 54          Assessment and Plan      Chest tightness and tachypnea in a 57yo female with a history of COPD, DM, CAD, ischemic stroke (2008), MI (2009), DJD of knees.     1. COPD exacerbation +/- anxiety: No hypoxia, +tachypnea, normal CXR. Readmission following ED obs stay precipitated by 3 days of SOB which was thought to be triggered by a viral syndrome. BNP suggestive of COPD exacerbation (not CHF). She was discharged without antibiotics or systemic steroids. She is readmitted because of risk for rapid decompensation.   S/p methylprednisolone in ED, continue steroid treatment with PO prednisone  60mg  daily   Completed azithromycin  total 1500mg    Continue Duoneb 4x daily with albuterol nebs q4h PRN  Continue home Symbicort 160-4.5   Overnight pulse ox shows desat, consider home O2    2. CAD, stable. No evidence of ACS at this time. First troponin negative. Continue outpatient medications, including clopidogrel, ASA.   Home ASA, Plavix   Continue home amlodipine 10mg  daily and losartan 100mg  daily   Add metoprolol 25mg , increase to 50mg  BID for rate control (convert to XR daily on d/c)   Echocardiogram shows borderline normal EF, concentric hypertrophy, enlarged LA    3. Type 2 DM. Previous HbA1c 8.3 (05/29/2011) not at goal for glycemic control.  HbA1c 10.7 on 03/17/2012; Lipids borderline; evidence of proteinuria.  Receiving steroids, monitor BGs 4x daily qac and qhs   Increase Lantus dose to 85 Units qhs, titrate up for uncontrolled BGs both home and associated with steroids   Humalog sliding scale, add nutritional 10 Units, as at home    4. Depression and anxiety. Recent issues with insomnia,  anxiety vs poor pulmonary status with recent COPD exacerbations.   Increase buspirone 20mg  BID, titrate up during admission   Continue home trazodone, 200mg  qhs   Titrating down and d/c duloxetine as patient has complained of insomnia and anxiety since starting. Consider back to bupropion for mood.    5. DJD. On outpatient long-acting morphine. States she took 3 pills day prior to admission.   Home morphine 30mg  BID prn.   1000mg  Tylenol TID prn    F: off IVF, restart for poor intake or NPO  E: no need for daily BMP, no evidence of renal injury from HTN  N: diabetic diet, nectar thick liquids- uses Thick-It starch at home to thicken     DVT ppx: She is on ASA and Plavix but these are not DVT ppx, will give lovenox     Code: Full, pt does not have HCP.     ELOS: 3-4 days                [name] MD, PhD  Family Medicine, R2  03/19/2012 5:59 AM          Family Medicine Attending Physician Attestation    I saw and  evaluated the patient. I agree with the resident's/fellow's findings and plan of care as documented above.  I reviewed and updated medications as appropriate.    York Pellant, MD, MPH, MS  Family Medicine Attending - Continuity Service    Signed at 9:49 PM on 03/19/2012.

## 2012-03-19 NOTE — Progress Notes (Signed)
Dysphagia Treatment Note     03/19/12 1423   Subjective   Subjective Patient Alert and Awake;Patient cooperative and pleasant   Pain Patient does not report pain   Objective   Objective Yes   Thin Liquids Throat clearing   NTL Throat clearing   Solid No overt s/s aspiration   OTHER   Objective Comments Pt accepting of trials of thin liquids and nectar thick liquids.  Intermittent throat clearing with both consistencies, no change with voice quality or respiratory pattern.  Tolerating solids without difficulty.   Assessment Impressions   Severity Mild   Types of dysphagia Pharyngeal phase dysphagia   Prognosis (to meet goals) Good   Assessment Comments Pt with baseline dysphagia s/p CVA, which per Pt was in 2008.  Pt readily admits to not being totally compliant with thickening liquids.  Today, Pt with intermittent throat clearing with trials of thins and nectar thick liquids.  No change with voice or respiratory status.  Pt appropriate to continue with thickened liquids due to risk of aspiration.   Diet Recommendations   Diet Consistency Regular   Liquid Consistency Nectar - thick liquids   Aspiration Precautions/Feeding Guidelines   Aspiration Precautions/feeding guidelines to include Upright 90 degrees for all meals;Slow rate   Therapeutic Recommendations   Frequency of service 1x follow up   Studies/Recommendations NA   Discharge Recommendations   Discharge Recommendations Outpatient SLP services  (Consider pharyngogram as outpatient.)   Time Calculation   Start Time 1345   Stop Time 1400   Time Calculation 15

## 2012-03-20 DIAGNOSIS — J441 Chronic obstructive pulmonary disease with (acute) exacerbation: Secondary | ICD-10-CM | POA: Diagnosis present

## 2012-03-20 LAB — POCT GLUCOSE
Glucose POCT: 166 mg/dL — ABNORMAL HIGH (ref 60–99)
Glucose POCT: 87 mg/dL (ref 60–99)
Glucose POCT: 125 mg/dL — ABNORMAL HIGH (ref 60–99)

## 2012-03-20 MED ORDER — METOPROLOL SUCCINATE 50 MG PO TB24 *I*
50.0000 mg | ORAL_TABLET | Freq: Every day | ORAL | Status: DC
Start: 2012-03-20 — End: 2012-07-17

## 2012-03-20 MED ORDER — INSULIN GLARGINE 100 UNIT/ML SC SOLN PEN *A*
85.0000 [IU] | Freq: Every evening | SUBCUTANEOUS | Status: DC
Start: 2012-03-20 — End: 2012-07-17

## 2012-03-20 MED ORDER — BUSPIRONE HCL 10 MG PO TABS *I*
20.0000 mg | ORAL_TABLET | Freq: Two times a day (BID) | ORAL | Status: DC
Start: 2012-03-20 — End: 2012-03-29

## 2012-03-20 MED ADMIN — Losartan Potassium Tab 25 MG: 100 mg | ORAL | NDC 00093736410

## 2012-03-20 MED ADMIN — Amlodipine Besylate Tab 5 MG (Base Equivalent): 10 mg | ORAL | NDC 67877019810

## 2012-03-20 MED ADMIN — Budesonide-Formoterol Fumarate Dihyd Aerosol 160-4.5 MCG/ACT: 2 | RESPIRATORY_TRACT | NDC 00186037028

## 2012-03-20 NOTE — Discharge Instructions (Signed)
Brief Summary of Your Hospital Course (including key procedures and diagnostic test results):  You were hospitalized for exacerbation of COPD and anxiety.  You were treated with steroid medications and observed for improvement.  For anxiety and insomnia, your buspirone was increased and duloxetine weaned during this admission.  Your blood sugars were high and lab tests show they have been high at home as well.  Your Lantus was increased.    Your medications were changed many times throughout this hospitalization. Your current medications are:      Medication List      START taking these medications         metoprolol 50 MG 24 hr tablet   Commonly known as:  TOPROL-XL   Take 1 tablet (50 mg total) by mouth daily   Do not crush or chew. May be divided.         CHANGE how you take these medications         busPIRone 10 MG tablet   Commonly known as:  BUSPAR   Take 2 tablets (20 mg total) by mouth 2 times daily   What changed:  how much to take       insulin glargine 100 UNIT/ML injection pen   Commonly known as:  LANTUS SOLOSTAR   Inject 85 Units into the skin nightly   May increase by 1 Unit daily until morning blood glucose is less than 120.  Maximum  95  Units/day   What changed:  - medication strength  - how much to take  - additional instructions         CONTINUE taking these medications         acetaminophen 500 mg tablet   Commonly known as:  TYLENOL   Take 2 tabs (1000mg ) up to three time daily as needed for pain. Max 6 tabs (3000mg ); decrease by 1 tab for every Lortab taken daily.       AEROCHAMBER Z-STAT PLUS/SMALL spacer device   Use as directed.       albuterol 108 (90 BASE) MCG/ACT inhaler   Commonly known as:  PROVENTIL, VENTOLIN, PROAIR HFA   Inhale 1-2 puffs into the lungs every 4 hours as needed for Wheezing   Shake well before each use.       amLODIPine 10 MG tablet   Commonly known as:  NORVASC   TAKE 1 TABLET (10MG  TOTAL) BY MOUTH EVERY DAY       aspirin 325 MG tablet   Take 1 tablet (325 mg  total) by mouth daily       blood glucose monitor kit   Use as instructed. Dx 250.02       blood glucose test strip   Test  3 times a day.   Brand name of strips: True Test.  ICD 9 250.02; Diabetes mellitus type 2, uncontrolled.       budesonide-formoterol 160-4.5 MCG/ACT inhaler   Commonly known as:  SYMBICORT   Inhale 2 puffs into the lungs 2 times daily       clopidogrel 75 MG tablet   Commonly known as:  PLAVIX   Take 1 tablet (75 mg total) by mouth daily       dextrose 40 % oral gel   Commonly known as:  GLUTOSE   Take 15 g by mouth once as needed for Low blood sugar       ibuprofen 600 MG tablet   Commonly known as:  ADVIL,MOTRIN  insulin lispro 100 UNIT/ML injection pen   Commonly known as:  HumaLOG KWIKPEN       insulin pen needle 31G X 5 MM   Commonly known as:  BD ULTRA-FINE PEN NEEDLE MINI   Use 3 times a day as instructed.       lancets   Brand Truetest; Use 3 times per day as directed for blood glucose testing.       losartan 50 MG tablet   Commonly known as:  COZAAR   Take 2 tablets (100 mg total) by mouth daily       morphine 30 MG 12 hr tablet   Commonly known as:  MS CONTIN   Take 1 tablet (30 mg total) by mouth 2 times daily   MDD 60mg .  30 day supply.       nebulizer device   As directed       omeprazole 20 MG capsule   Commonly known as:  PriLOSEC   TAKE 1 CAPSULE DAILY.       rosuvastatin 10 MG tablet   Commonly known as:  CRESTOR       starch powder packet   Commonly known as:  THICK-IT 2   Add to liquid or pureed foods for thickening as needed.       STOOL SOFTENER 100 MG capsule   Generic drug:  docusate sodium       traZODone 100 MG tablet   Commonly known as:  DESYREL   Take 2 tablets (200 mg total) by mouth nightly         STOP taking these medications         diphenhydrAMINE 50 MG capsule   Commonly known as:  BENADRYL       DULOXETINE HCL PO       MUCINEX 600 MG 12 hr tablet   Generic drug:  guaiFENesin       polyethylene glycol powder   Commonly known as:  GLYCOLAX        promethazine 25 MG tablet   Commonly known as:  PHENERGAN       zolpidem 5 MG tablet   Commonly known as:  AMBIEN               Where to Get Your Medications    You need to pick up these prescriptions. We sent them to a specific pharmacy, so go there to get them.           [facility] - [place], [place] [address]   -  busPIRone 10 MG tablet   -  insulin glargine 100 UNIT/ML injection pen   -  metoprolol 50 MG 24 hr tablet    [address]   [place] [address]   Phone:  [phone]                      Your instructions:  - Take all medications as directed. You should not start or stop any medication without talking to your doctor first.     Appointments:   Please call [facility] [address] for an appointment within 1 weeks of discharge.  The number is [phone].    Recommended diet: Diabetic diet.      Recommended activity: Increase activity, as tolerated    If you experience any of these symptoms within the first 24 hours after discharge:Uncontrolled pain, Chest pain, Shortness of breath, Fever of 101 F.  or greater, Poor urinary output, Vomiting, Nausea or Loss of consciousness  please follow up with your CCP Dr. [name] at phone-number: [phone]

## 2012-03-20 NOTE — Plan of Care (Signed)
Problem: Safe Discharge Barriers  Goal: Safe Discharge  Intervention: Transportation  SW received approval from Medicaid and arranged pt's transport back to [facility]. Pt will be picked up by [facility] at 1pm and transported to [facility].     Rosine Door, LMSW  03/20/2012  12:00 PM

## 2012-03-20 NOTE — Progress Notes (Signed)
SW contacted [facility]  [phone] and spoke with one of the resident counselors. SW informed them that pt is ready for discharge today and SW can arrange her transport for 1pm.The resident counselor informed SW that pt is all set to return. SW will continue to follow as needed for d/c planning.    Rosine Door, LMSW  03/20/2012  9:17 AM

## 2012-03-20 NOTE — Discharge Summary (Signed)
Discharge Summary       Admit date: 03/17/2012         Discharge date and time: 03/20/2012  12:43 PM  Admitting Physician: York Pellant, MD   Discharge Attending:  York Pellant, MD    Patient: Gloria Ray Age: 57 y.o. Date of Birth: 1955/05/31 [id number]    Chief Complaint: SOB   Principal Problem: Chronic airway obstruction, not elsewhere classified    Details of Admission: as per admission H&P    Discharge Diagnoses:  Active Hospital Problems    Diagnosis   . Anxiety and depression     Needs PHQ at next visit  PHQ9 11/17/2011   PHQ-9 Total Score 14          . Type II, uncontrolled     Ophthalmologist:  Podiatrist:  [facility]  [phone]  Lab Results   Component Value Date    HA1C 8.3* 05/29/2011    HA1C 10.6* 03/27/2011    HA1C 10.5* 11/25/2010    MALBR 1.73 03/27/2011    CREAT 1.00* 12/22/2011    LDLC 111 09/21/2011            . Hypertension     Created by Conversion          Resolved Hospital Problems    Diagnosis   . COPD with acute exacerbation       Hospital Course (including key diagnostic test results):    COPD  Patient was admitted for continued SOB, impending sense of doom.  She was treated with a 5 day course of steroids and with bronchodilators.  She completed a course of IV azithromycin for total of 1500mg .  She was not continued on steroids on discharge.  Overnight pulse ox was performed and showed a single desaturation.    Anxiety  During the admission her anxiety was addressed both with reassurance and by changing medicaitons.  Buspirone dose was serially increased to 20mg  BID, and taped off duloxetine.      HTN/CAD  She experienced high blood pressures and metoprolol was added to her medication regimen as she has a history of CAD.  She had an echocardiogram showing borderline normal EF with concentric hypertrophy and enlarged LA.    Diabetes  Her HbA1c was found to be 10.7, and he Lantus dose increased to 95 Units qhs (split into 2 injections eg 40, and 45 Units).  Humalog  was sliding scale with 10 Units nutritional baseline.      Key Exam Findings at Discharge:    Vitals: Blood pressure 150/90, pulse 89, temperature 36.3 C (97.3 F), temperature source Temporal, resp. rate 16, height 1.524 m (5'), weight 104 kg (229 lb 4.5 oz), SpO2 96.00%.    Admission Weight: Weight: 104.327 kg (230 lb)  Discharge Weight: Weight: 104 kg (229 lb 4.5 oz)       Pending Test Results: none    Consulting Providers: none    Discharged Condition: good    Discharge medications, instructions, and follow-up plans: as per After Visit Summary  Disposition: Discharged to Other Facility: [facility]      Signed: [name], MD  On: 03/20/2012  at: 12:43 PM

## 2012-03-20 NOTE — Progress Notes (Signed)
Patient discharged to shelter today, vss/afebrile and IV d/c'd.  Patient reviewed discharge instructions, no questions.  Patient aware of medications after discharge and need to check glucose levels.  Patient aware of follow up appointment with Dr. Juliane Poot on 03/29/12. Patient left MOU accompanied by PCT via wheelchair to [facility] transport. Darci Needle, RN

## 2012-03-20 NOTE — Progress Notes (Signed)
Speech Pathology Treatment Note: Discharge    S: (Subjective)  Pt reports wanting to go home.    O: (Objective)  Pt was seen for Dysphagia therapy. Baseline dysphagia s/p CVA. Trials of thin liquids provided today. Delayed throat clear and cough x1.  NTL container (closed) sitting on tray near Pt and she insists that is what she drinks. Thin liquids with a straw sitting next to NTL and 2/3 empty. Pt is being D/C'd today. Previous rec'd for outpatient swallow study/pharyngogram to r/o aspiration and GI component. Please consider OP SLP Tx; however, Pt may not comply with rec'd. SLP D/C at this time. Thank you.    A: (Assessment)    Pt presents with intermittent coughing and throat clearing with thin liquids>Nectar thick liquids. Pt prefers thins. Risks have been explained. SLP D/C at this time. Pt D/C home today. Please consider objective study as OP is warranted.    P: (Plan)   1. SLP D/C today. Pt D/C home.  2. Regular with Nectar Thick Liquids.  3. OP objective swallow study to r/o aspiration, if warranted.  4. Thank you.    Bertell Maria, MS, CCC-SLP   Speech Language Pathology  [facility]  [address], [address]  [place], [place]  [address]  Tel. [phone]  Fax [phone]

## 2012-03-20 NOTE — Progress Notes (Addendum)
Family Medicine R2 Progress Note                                                Significant 24 Hour Events    HOD 4   No acute events overnight      Subjective      Patient feeling well.  Sleeping much better.  Mood is good, anxiety is controlled.  Let [facility] know last night that she would be coming back this morning, "they said OK".  She would like to go home this morning if possible.  Denies SOB, CP, palpitations, nausea.       Objective      Physical Exam  Recent vital signs reviewed and notable for:  BP well controlled.  Other VS WNL with stable trends  Maintaining sat on room air.  BP 130/74  Pulse 76  Temp(Src) 36.3 C (97.3 F) (Temporal)  Resp 16  Ht 1.524 m (5')  Wt 104 kg (229 lb 4.5 oz)  BMI 44.78 kg/m2  SpO2 97%       General Constitutional: Tired appearing, sitting in reclining chair. No acute distress.   HEENT: Atraumatic, EOM normal, MMM   Cardiovascular: Elevated rate, regular rhythm, S1, S2. No murmurs.   Pulmonary: Unlabored breaths, shallow but near baseline. CTAB.   Gastrointestinal: Soft, non-tender, obese abdomen.   Musculoskeletal: Moving all extremities, no edema   Neurologic: Alter, oriented x3, speech is baseline.   Skin: No rashes, purpura or petechiae on exposed surfaces   Psych: Mood is moderately anxious. Affect congruent with mood.       Recent Lab, Micro, and Imaging Studies   Personally reviewed and notable for:      BGs: 154-338, overnight 166 at 0300h           Assessment and Plan      Chest tightness and tachypnea in a 57yo female with a history of COPD, DM, CAD, ischemic stroke (2008), MI (2009), DJD of knees.     1. COPD exacerbation +/- anxiety: No hypoxia, +tachypnea, normal CXR. Readmission following ED obs stay precipitated by 3 days of SOB which was thought to be triggered by a viral syndrome. BNP suggestive of COPD exacerbation (not CHF). She was discharged without antibiotics or systemic steroids. She was readmitted because of risk for rapid decompensation, now is  back at baseline.  S/p methylprednisolone in ED, 4 days of PO prednisone 60mg  daily   D/c further prednisone, no need for taper  Completed azithromycin  total 1500mg    Continue Duoneb 4x daily with albuterol nebs q4h PRN  Continue home Symbicort 160-4.5   Overnight pulse ox shows desat, consider home O2    2. CAD, stable. No evidence of ACS at this time. First troponin negative. Continue outpatient medications, including clopidogrel, ASA.   Home ASA, Plavix   Continue home amlodipine 10mg  daily and losartan 100mg  daily   50mg  BID for rate control (convert to XR daily on d/c)   Echocardiogram shows borderline normal EF, concentric hypertrophy, enlarged LA    3. Type 2 DM. Previous HbA1c 8.3 (05/29/2011) not at goal for glycemic control.  HbA1c 10.7 on 03/17/2012; Lipids borderline; evidence of proteinuria.  Nursing Diabetes teaching prior to leaving  Receiving steroids, monitor BGs 4x daily qac and qhs  Increase Lantus dose to 90 Units qhs, titrate up for uncontrolled BGs  both home and associated with steroids   Humalog sliding scale, with nutritional 10 Units, as at home    4. Depression and anxiety. Recent issues with insomnia, anxiety vs poor pulmonary status with recent COPD exacerbations.   Buspirone 20mg  BID, will send home on this dose  Continue home trazodone, 200mg  qhs   Off duloxetine as patient has complained of insomnia and anxiety since starting. Consider back to bupropion vs re-trial of SNRI + buspirone for mood.    5. DJD. On outpatient long-acting morphine. States she took 3 pills day prior to admission.   Home morphine 30mg  BID prn.   1000mg  Tylenol TID prn    F: off IVF, restart for poor intake or NPO  E: no need for daily BMP, no evidence of renal injury from HTN  N: diabetic diet, nectar thick liquids- uses Thick-It starch at home to thicken     DVT ppx: She is on ASA and Plavix but these are not DVT ppx, will continue lovenox     Code: Full, pt does not have HCP.     ELOS: Anticipated discharge  today 03/21/2011 if able to go back to [facility]               [name] MD, PhD  Family Medicine, R2  03/20/2012 6:35 AM        Family Medicine Attending Physician Attestation    I saw and evaluated the patient. I agree with the resident's/fellow's findings and plan of care as documented above.  I reviewed and updated medications as appropriate.    York Pellant, MD, MPH, MS  Family Medicine Attending - Continuity Service    Signed at 7:16 PM on 03/20/2012.
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
## 2012-03-20 NOTE — Progress Notes (Signed)
DM teaching completed with patient, information booklet given.  Patient states she has glucometer, strips and lancets to check BG once d/c'd from hospital. Darci Needle, RN

## 2012-03-24 LAB — EKG 12-LEAD
P: 45 degrees
QRS: -7 degrees
Rate: 85 {beats}/min
Severity: ABNORMAL
Severity: ABNORMAL
T: 75 degrees

## 2012-03-26 ENCOUNTER — Other Ambulatory Visit: Payer: Self-pay | Admitting: Family Medicine

## 2012-03-26 MED ORDER — BLOOD PRESSURE MONITOR DEVICE *A*
Status: DC
Start: 2012-03-26 — End: 2012-04-02

## 2012-03-26 MED ORDER — MORPHINE SULFATE ER 30 MG PO TBCR *I*
30.0000 mg | ORAL_TABLET | Freq: Three times a day (TID) | ORAL | Status: DC | PRN
Start: 2012-03-26 — End: 2012-04-22

## 2012-03-26 NOTE — Progress Notes (Signed)
Patient calling reporting that the cold has increased her knee pain.  She has been taking extra tabs of morphine when she goes out to walk around.  Initially reports only taking 2 tabs per day.  Discussed with patient terms of controlled substance agreement, warned that this was a violation of the agreement.  Re-prescribed with reported requirements of 90mg  daily, will monitor.    [name] MD, PhD  Family Medicine, R2  03/26/2012 4:29 PM

## 2012-03-29 ENCOUNTER — Encounter: Payer: Self-pay | Admitting: Family Medicine

## 2012-03-29 ENCOUNTER — Ambulatory Visit: Payer: Self-pay | Admitting: Family Medicine

## 2012-03-29 VITALS — BP 186/83 | HR 107 | Ht 59.84 in | Wt 222.0 lb

## 2012-03-29 DIAGNOSIS — IMO0001 Reserved for inherently not codable concepts without codable children: Secondary | ICD-10-CM

## 2012-03-29 DIAGNOSIS — I10 Essential (primary) hypertension: Secondary | ICD-10-CM

## 2012-03-29 DIAGNOSIS — K219 Gastro-esophageal reflux disease without esophagitis: Secondary | ICD-10-CM

## 2012-03-29 MED ORDER — OMEPRAZOLE 40 MG PO CPDR *I*
40.0000 mg | DELAYED_RELEASE_CAPSULE | Freq: Every day | ORAL | Status: DC
Start: 2012-03-29 — End: 2012-10-15

## 2012-03-29 MED ORDER — BUSPIRONE HCL 10 MG PO TABS *I*
10.0000 mg | ORAL_TABLET | Freq: Three times a day (TID) | ORAL | Status: DC
Start: 2012-03-29 — End: 2012-04-10

## 2012-03-29 MED ORDER — CLOPIDOGREL BISULFATE 75 MG PO TABS *I*
75.0000 mg | ORAL_TABLET | Freq: Every day | ORAL | Status: DC
Start: 2012-03-29 — End: 2012-10-15

## 2012-03-29 MED ORDER — ASPIRIN 325 MG PO TABS *I*
325.0000 mg | ORAL_TABLET | Freq: Every day | ORAL | Status: DC
Start: 2012-03-29 — End: 2012-10-02

## 2012-03-29 NOTE — Progress Notes (Signed)
Subjective     Chief Complaint: Chronic Obstruction Pulmonary Disease and Diabetes  HPI  Recent hospital admission for COPD exacerbation, now doing well.  Has apartment set for next month, planning to move out of [facility] shelter.  Would like to review medications to minimize and establish regiment she can manage independently     Medicaiton reconcilliation performed:   Taking buspirone 10mg  TID only (in hospital increased from 10mg  BID to 20mg  BID)   Taking morphine ER 30mg  BID to TID PRN   Taking dulcolax   Taking Lantus 80 Units qhs and Humalog 30 Units TID (in hospital, Lantus increased to 95 Units qhs)   Has amlodipine and metoprolol   Taking priolosec, still having GERD sometimes   Not taking duloxetine, quetiapine, or diphenhydramine   Does not have inhalers with her but reports using as directed    Status unknown:   Aspirin and clopidogril   Losartan   Trazodone   Polyethylene glycol   Thick-it starch    ROS    Pertinent positives and negatives of a 12-point review of systems noted above.      Objective     Recorded Vital Signs:    BP 186/83  Pulse 107  Ht 1.52 m (4' 11.84")  Wt 100.699 kg (222 lb)  BMI 43.59 kg/m2  SpO2 94%     Physical Exam   VITALS: Reviewed above and hypertensive.  BMI category is morbid obesity  GEN: Well developed, well nourished, appears stated age, no acute distress  HEENT: Atraumatic.  EOM normal, conjunctiva clear.  Moist mucus membranes, no oropharyngeal lesions.  Trachea midline, no lymphadenopathy or thyromegaly.  PULM: Lungs are clear to auscultation bilaterally with good effort.  No crackles, rales, or wheezes appreciated.  CV: Regular rate, S1, S2.  No S3, S4, murmurs or rubs.  No JVD, no carotid bruit.  ABD: Bowel sounds normal.  Soft, non-tender, non-distended.  No hepatosplenomegaly.  EXT: Warm and well perfused.  No edema.  Radial and dorsalis pedis pulses palpable.  SKIN: Warm and dry. No rashes, ecchymoses, or petechiae.  NEURO:  Alert.  Cranial nerves  grossly intact.  Strength is normal and symmetric.  Gait is normal.  Balance is normal.    PSYCH:  Patient well dressed and well groomed, sitting in chair.  Speech is normal.  Mood is euthymic.  Affect is congruent with mood.  Thought content is linear with no expressed paranoia, delusions, or hallucinations; no suicidal or homicidal ideation.  Judgement is impaired.  Insight is impaired.      Assessment & Plan        Gloria Ray is a 57 y.o. female and  has a past medical history of Asthma; COPD (chronic obstructive pulmonary disease); Coronary artery disease; Diabetes mellitus; Hypertension; Ischemic Stroke (03/06/2006); Prior Myocardial Infarction (03/19/2007); Unspecified cerebral artery occlusion with cerebral infarction; and Arthritis.    Polypharmacy.  History of medication non-adherence, complicated medication regimen.   likley benefit from [facility] pharmacist consult    Hypertension.  Uncontrolled.   Amlodipine 10mg  daily   Metoprolol XL 50mg  daily   Losartan 50mg  daily   Rx for home BP monitor    CAD. Hx of stent.   Aspirin EC 81mg  daily   Clopidogril 75mg  daily    Diabetes   Lantus 95 Units qhs   Humalog unknown, nutritional baseline between 10-30 Units historically, poor adherence to diabetic diet   Check BGs 3-4x daily   Needs diabetes teaching, reinforcement  DJD   Morphine 30mg  ER BID   polyethyene glycol 17g daily, BID PRN for constipation    COPD   Symbicort 160-4.5 2puffs BID.  Wash out mouth after use   Albuterol q4h as needed   Consider home nebulizer with duoneb for early exacerbation   Consider home doxycycline for early exacerbation   Consider home prednisone for early exacerbation   Needs COPD teaching/plan for illness    Anxiety   Buspirone 20mg  BID    GERD   Omeprazole 40mg  daily     Needs paperwork for dental procedure, no antibiotics for hx of CAD with stenting.  She has no known valvular issues       [name] MD, PhD   R2 [facility]   03/29/2012   1:36 PM     --------------------    Preceptor Attestation     I saw and evaluated the patient, and discussed the care with the resident. I agree with the resident's/fellow's findings and plan of care as documented above. Details of my evaluation are as follows:   S:uncontrolled HTN  COPD  DM  DJD  O: pleasant NAD  A/P: encourage med compliance  Simplify regimen  Consider re-adding ARB  Close f/up    [name], MD  [facility]  5:22 PM

## 2012-03-29 NOTE — Assessment & Plan Note (Signed)
S/O:       Patient reports taking medications as prescribed 0/7 days last week and missing many times last month.  Denies adverse medication effects at this time.  Not checking blood pressures outside of the office.  Denies headache, vision changes, chest pain, palpitations, SOB, dizziness, loss of consciousness or loss of motor function.  A/P:      Uncontrolled, non-adherent to medication regimen due to polypharmacy, desire to understand and minimize medications taken.   Patient currently has metoprolol and amlodipine with her, will work on adherence to these medications   Not taking losartan, needs ARB for optimal diabetes and heart failure treatment.

## 2012-04-02 ENCOUNTER — Other Ambulatory Visit: Payer: Self-pay

## 2012-04-02 NOTE — Telephone Encounter (Signed)
Patient called and stated that they lost the copy of the rx that they received for the blood pressure monitor. Patient asking for the rx to be mailed to home address. Also patient wonders if you have received any paper work from [employer]?

## 2012-04-04 ENCOUNTER — Telehealth: Payer: Self-pay

## 2012-04-08 MED ORDER — BLOOD PRESSURE MONITOR DEVICE *A*
Status: DC
Start: 2012-04-02 — End: 2012-07-17

## 2012-04-10 ENCOUNTER — Telehealth: Payer: Self-pay | Admitting: Primary Care

## 2012-04-10 DIAGNOSIS — E1140 Type 2 diabetes mellitus with diabetic neuropathy, unspecified: Secondary | ICD-10-CM

## 2012-04-10 MED ORDER — GABAPENTIN 300 MG PO CAPS
300.0000 mg | ORAL_CAPSULE | Freq: Every evening | ORAL | Status: DC
Start: 2012-04-10 — End: 2012-07-04

## 2012-04-10 MED ORDER — BUSPIRONE HCL 10 MG PO TABS *I*
20.0000 mg | ORAL_TABLET | Freq: Two times a day (BID) | ORAL | Status: DC
Start: 2012-04-10 — End: 2012-05-08

## 2012-04-10 NOTE — Telephone Encounter (Signed)
Hello,    Patient said you were going to call her in something for her cramps she is having in her hands and feet    Thanks,

## 2012-04-12 ENCOUNTER — Other Ambulatory Visit: Payer: Self-pay | Admitting: Family Medicine

## 2012-04-12 ENCOUNTER — Ambulatory Visit: Payer: Self-pay | Admitting: Family Medicine

## 2012-04-14 ENCOUNTER — Telehealth: Payer: Self-pay | Admitting: Family Medicine

## 2012-04-14 DIAGNOSIS — J449 Chronic obstructive pulmonary disease, unspecified: Secondary | ICD-10-CM

## 2012-04-14 MED ORDER — ALBUTEROL SULFATE HFA 108 (90 BASE) MCG/ACT IN AERS *I*
1.0000 | INHALATION_SPRAY | RESPIRATORY_TRACT | Status: DC | PRN
Start: 2012-04-14 — End: 2012-07-22

## 2012-04-14 MED ORDER — PREDNISONE 20 MG PO TABS *I*
40.0000 mg | ORAL_TABLET | Freq: Every day | ORAL | Status: AC
Start: 2012-04-14 — End: 2012-04-19

## 2012-04-14 MED ORDER — IPRATROPIUM-ALBUTEROL 0.5-2.5 MG/3ML IN SOLN *I*
3.0000 mL | Freq: Four times a day (QID) | RESPIRATORY_TRACT | Status: DC | PRN
Start: 2012-04-14 — End: 2012-05-02

## 2012-04-14 MED ORDER — DOXYCYCLINE HYCLATE 100 MG PO TBEC *A*
100.0000 mg | DELAYED_RELEASE_TABLET | Freq: Two times a day (BID) | ORAL | Status: AC
Start: 2012-04-14 — End: 2012-04-21

## 2012-04-14 NOTE — Telephone Encounter (Signed)
Assessment & Plan        Gloria Ray is a 57 y.o. female and  has a past medical history of Asthma; COPD (chronic obstructive pulmonary disease); Coronary artery disease; Diabetes mellitus; Hypertension; Ischemic Stroke (03/06/2006); Prior Myocardial Infarction (03/19/2007); Unspecified cerebral artery occlusion with cerebral infarction; and Arthritis.       The patient evaluated via phone for: Shortness of Breath      Chronic Obstructive Pulmonary Disease      Possible early COPD exacerbation, history of recent hospitalizations for the same.  Differential includes exacerbation of anxiety disorder, but patient in fairly reliable in predicting trajectory of illness and need for hospitalization in the past.  No chest pain, multiple recent negative cardiac workups despite hx of CAD and MI, unlikely represents cardiac etiology though this should continue to be considered.   Trial of prednisone 40mg  PO for 5 days   Doxycycline 100mg  BID for 7 days   Rx for Duoneb at home q4-6h with albuterol spaced between as needed   To ED if worsening symptoms on prednisone   Anticipate f/u in clinic 2/11 with NP, reassessment of clinical picture       Subjective        Chief Complaint: Shortness of Breath    HPI     Chronic Obstructive Pulmonary Disease       Patient calling to report SOB, increasing use of albuterol inhaler hat home.  Concern for early COPD exacerbation, does not want to have to go to hospital again.  Taking Symbicort BID as prescribed.  Has nebulizer device but no ipratropium-albuterol or Combivent inhaler at home.  Incidentally reports recent BG of 135.  Patient is planning to move from [facility] shelter to own apartment, and this is a significant source of recent stress and anxiety for her.      Pertinent positives and negatives of a focused review of systems noted above.       Objective     Physical Exam   GEN: Voice is clear on phone, speech is normal.  Calm and cooperative, thoughts are linear. No acute  distress.     Note in APSO format, see top for Assessment & Plan.     [name] MD, PhD   R2 [facility]   04/14/2012  11:09 PM

## 2012-04-14 NOTE — Assessment & Plan Note (Addendum)
S/O:       Patient calling to report SOB, increasing use of albuterol inhaler hat home.  Concern for early COPD exacerbation, does not want to have to go to hospital again.  Taking Symbicort BID as prescribed.  Has nebulizer device but no ipratropium-albuterol or Combivent inhaler at home.  Incidentally reports recent BG of 135.  Patient is planning to move from [facility] to own apartment, and this is a significant source of recent stress and anxiety for her.  A/P:      Possible early COPD exacerbation, history of recent hospitalizations for the same.  Differential includes exacerbation of anxiety disorder, but patient in fairly reliable in predicting trajectory of illness and need for hospitalization in the past.   Trial of prednisone 40mg  PO for 5 days   Doxycycline 100mg  BID for 10 days   Rx for Duoneb at home q4-6h with albuterol spaced between as needed   To ED if worsening symptoms on prednisone   Anticipate f/u in clinic 2/11 with NP, reassessment of clinical picture

## 2012-04-16 ENCOUNTER — Ambulatory Visit: Payer: Self-pay | Admitting: Family Medicine

## 2012-04-16 NOTE — Progress Notes (Signed)
Hypertension:   Based on this patient's clinical history and according to JNC guidelines, target BP goal is:  Less than 130/80  Based on the last BP of   the patient is:  Above goal  The plan to reach goal:   Reviewed patient's understanding of medications including barriers to adherence:  Yes  Hypertension lifestyle modifications:  Weight reduction, exercise plan, discussed dietary sodium reduction and medication compliance  Referral to Care Management:  No  Patient Ed/Self Management tools provided:  Yes    First blood pressure;  Systolic: 150  Diastolic:  87  Second blood pressure:  Systolic:  149  Diastolic:  74  Follow up scheduled for 05/28/12

## 2012-04-17 ENCOUNTER — Telehealth: Payer: Self-pay

## 2012-04-17 NOTE — Telephone Encounter (Signed)
04/17/12 Pt called and stated she has a problem with excessive urination. Pt states that for the past three days she has taken Oxybutyin ER 10 mg. Pt states her friend was taking this medication for the same problem and she decided to try it. Pt states that this seemed to work for her and she was not going to the bathroom as much. Pt states her urine appears normal. (Pt strongly advised by this nurse to please not take any medications without her Dr's approval. Pt stated she understood.) Pt is requesting a prescription for this medication from Provider. Pt also asking about her insurance papers given to Provider to receive assistance from a home health aide. Pt states she is moving on 2/20 to her apartment on [address] and will need help. Please advise [address] RN. Thank you.

## 2012-04-18 ENCOUNTER — Ambulatory Visit: Payer: Self-pay | Admitting: Ophthalmology

## 2012-04-19 ENCOUNTER — Telehealth: Payer: Self-pay

## 2012-04-19 ENCOUNTER — Other Ambulatory Visit: Payer: Self-pay

## 2012-04-19 NOTE — Telephone Encounter (Signed)
Patient calling in asking about paperwork for her to change her living situation. Have you seen this form?

## 2012-04-22 ENCOUNTER — Ambulatory Visit: Payer: Self-pay

## 2012-04-22 ENCOUNTER — Encounter: Payer: Self-pay | Admitting: Gastroenterology

## 2012-04-22 ENCOUNTER — Other Ambulatory Visit: Payer: Self-pay | Admitting: Primary Care

## 2012-04-23 MED ORDER — MORPHINE SULFATE ER 30 MG PO TBCR *I*
30.0000 mg | ORAL_TABLET | Freq: Three times a day (TID) | ORAL | Status: DC | PRN
Start: 2012-04-22 — End: 2012-05-20

## 2012-04-24 ENCOUNTER — Ambulatory Visit: Payer: Self-pay

## 2012-04-24 ENCOUNTER — Telehealth: Payer: Self-pay

## 2012-04-24 NOTE — Telephone Encounter (Signed)
Pt arrived for PPD placement and stated the nurse at [facility] had assessed her nasal passages and had told pt that they were closed, may be contributing to her difficulty with breathing and felt pt.should address this with her provider.  Pt aware she has upcoming appt on 05/01/12 at 11:00 am and she should discuss this with her provider at this time.

## 2012-04-24 NOTE — Progress Notes (Signed)
Pt arrived for PPD placement for housing purposes.  PPD placed in left forearm.  Pt aware to return in 48-78 hours for reading.

## 2012-04-26 ENCOUNTER — Ambulatory Visit: Payer: Self-pay

## 2012-04-26 ENCOUNTER — Telehealth: Payer: Self-pay

## 2012-04-26 LAB — READ PPD: Induration:TB skin test: NEGATIVE %

## 2012-04-26 NOTE — Progress Notes (Unsigned)
Patient here for PPD reading . PPD negative with no induration . Patient requests a referral to [facility]CR to restart her aid service at home . She has moved into a new apartment. Referral made .

## 2012-04-26 NOTE — Telephone Encounter (Signed)
Referral made to [facility] for home care .

## 2012-05-01 ENCOUNTER — Other Ambulatory Visit: Payer: Self-pay | Admitting: Primary Care

## 2012-05-01 ENCOUNTER — Ambulatory Visit: Payer: Self-pay | Admitting: Family Medicine

## 2012-05-01 DIAGNOSIS — F32A Depression, unspecified: Secondary | ICD-10-CM

## 2012-05-01 DIAGNOSIS — G4700 Insomnia, unspecified: Secondary | ICD-10-CM

## 2012-05-02 ENCOUNTER — Other Ambulatory Visit: Payer: Self-pay

## 2012-05-02 ENCOUNTER — Other Ambulatory Visit: Payer: Self-pay | Admitting: Family Medicine

## 2012-05-02 MED ORDER — INSULIN LISPRO (HUMAN) 100 UNIT/ML SC SOLN PEN *A*
30.0000 [IU] | Freq: Three times a day (TID) | SUBCUTANEOUS | Status: DC
Start: 2012-05-02 — End: 2012-07-17

## 2012-05-02 MED ORDER — TRAZODONE HCL 100 MG PO TABS *I*
200.0000 mg | ORAL_TABLET | Freq: Every evening | ORAL | Status: DC
Start: 2012-05-01 — End: 2012-07-15

## 2012-05-08 ENCOUNTER — Ambulatory Visit: Payer: Self-pay | Admitting: Family Medicine

## 2012-05-08 ENCOUNTER — Encounter: Payer: Self-pay | Admitting: Family Medicine

## 2012-05-08 VITALS — BP 153/84 | HR 94 | Ht 64.17 in | Wt 230.0 lb

## 2012-05-08 DIAGNOSIS — J449 Chronic obstructive pulmonary disease, unspecified: Secondary | ICD-10-CM

## 2012-05-08 DIAGNOSIS — I10 Essential (primary) hypertension: Secondary | ICD-10-CM

## 2012-05-08 DIAGNOSIS — F32A Depression, unspecified: Secondary | ICD-10-CM

## 2012-05-08 DIAGNOSIS — I2510 Atherosclerotic heart disease of native coronary artery without angina pectoris: Secondary | ICD-10-CM

## 2012-05-08 DIAGNOSIS — R0981 Nasal congestion: Secondary | ICD-10-CM

## 2012-05-08 MED ORDER — PREDNISONE 20 MG PO TABS *I*
40.0000 mg | ORAL_TABLET | Freq: Every day | ORAL | Status: DC
Start: 2012-05-08 — End: 2012-07-17

## 2012-05-08 MED ORDER — BUSPIRONE HCL 10 MG PO TABS *I*
30.0000 mg | ORAL_TABLET | Freq: Two times a day (BID) | ORAL | Status: DC
Start: 2012-05-08 — End: 2012-07-17

## 2012-05-08 MED ORDER — LOSARTAN POTASSIUM 50 MG PO TABS *I*
100.0000 mg | ORAL_TABLET | Freq: Every day | ORAL | Status: DC
Start: 2012-05-08 — End: 2012-07-17

## 2012-05-08 MED ORDER — LOSARTAN POTASSIUM 50 MG PO TABS *I*
100.0000 mg | ORAL_TABLET | Freq: Every day | ORAL | Status: DC
Start: 2012-05-08 — End: 2012-05-08

## 2012-05-08 MED ORDER — SALINE NASAL SPRAY 0.65 % NA SOLN *WRAPPED*
1.0000 | NASAL | Status: AC | PRN
Start: 2012-05-08 — End: ?

## 2012-05-08 MED ORDER — BUSPIRONE HCL 10 MG PO TABS *I*
30.0000 mg | ORAL_TABLET | Freq: Two times a day (BID) | ORAL | Status: DC
Start: 2012-05-08 — End: 2012-05-08
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
## 2012-05-08 NOTE — Assessment & Plan Note (Signed)
S/O:       Patient requesting letter to be able to have dental surgery within the month.  She has history of stent placement and the question is whether to use pre-op antibiotics.  The fax number is [phone].  Otherwise doing well.  Denies CP or change in baseline SOB.  A/P:      Hx of stent, no needs for endocarditis prophylaxis.  Hold aspirin and Plavix prior to surgery.   Letter to dentist

## 2012-05-09 DIAGNOSIS — F419 Anxiety disorder, unspecified: Secondary | ICD-10-CM

## 2012-05-16 ENCOUNTER — Other Ambulatory Visit: Payer: Self-pay | Admitting: Family Medicine

## 2012-05-16 ENCOUNTER — Telehealth: Payer: Self-pay | Admitting: Family Medicine

## 2012-05-16 NOTE — Telephone Encounter (Signed)
Patient called [facility] requesting weekly home BP checks. If you want this done, [facility]CR needs a Dr. Magdalene Molly, thanks!!

## 2012-05-20 ENCOUNTER — Other Ambulatory Visit: Payer: Self-pay | Admitting: Primary Care

## 2012-05-20 MED ORDER — MORPHINE SULFATE ER 30 MG PO TBCR *I*
30.0000 mg | ORAL_TABLET | Freq: Two times a day (BID) | ORAL | Status: DC | PRN
Start: 2012-05-20 — End: 2012-06-14

## 2012-05-21 NOTE — Telephone Encounter (Signed)
Please advise 800 RN

## 2012-05-21 NOTE — Telephone Encounter (Signed)
Call from [facility] again, is this warranted for this patient?

## 2012-05-27 ENCOUNTER — Telehealth: Payer: Self-pay

## 2012-05-27 ENCOUNTER — Other Ambulatory Visit: Payer: Self-pay

## 2012-05-27 NOTE — Telephone Encounter (Signed)
Dana,RN-HCR called, says patient requesting to have a weekly nurse visit to have her b/p checked.  Last b/p check today was 142/74.  She's asking if you want this request put in place?

## 2012-06-03 ENCOUNTER — Telehealth: Payer: Self-pay

## 2012-06-03 NOTE — Telephone Encounter (Signed)
Per conversation with Dr. Juliane Poot pt was notified to take 40units Lantus at [facility] on 3/31 and return to regular dosing of 80units HS on 4/1. Pt confirms understanding.

## 2012-06-03 NOTE — Telephone Encounter (Signed)
Pt forgot to take her 80units of Lantus last night and so took this morning. Should she still take her 80 units at HS tonight?  Please advise [id number] pool so pt can be notified. Thank You!!!    Test to Dr. Juliane Poot

## 2012-06-12 ENCOUNTER — Encounter: Payer: Self-pay | Admitting: Gastroenterology

## 2012-06-14 ENCOUNTER — Ambulatory Visit: Payer: Self-pay | Admitting: Family Medicine

## 2012-06-14 ENCOUNTER — Encounter: Payer: Self-pay | Admitting: Family Medicine

## 2012-06-14 VITALS — BP 163/86 | HR 89 | Temp 96.5°F | Ht 64.17 in | Wt 226.0 lb

## 2012-06-14 DIAGNOSIS — N898 Other specified noninflammatory disorders of vagina: Secondary | ICD-10-CM

## 2012-06-14 DIAGNOSIS — M25569 Pain in unspecified knee: Secondary | ICD-10-CM

## 2012-06-14 MED ORDER — K-Y LUBRICANT JELLY SENSITIVE EX GEL
CUTANEOUS | Status: AC
Start: 2012-06-14 — End: ?

## 2012-06-14 MED ORDER — FLUCONAZOLE 200 MG PO TABS *I*
200.0000 mg | ORAL_TABLET | Freq: Every day | ORAL | Status: DC
Start: 2012-06-14 — End: 2012-07-17

## 2012-06-14 MED ORDER — MORPHINE SULFATE ER 30 MG PO TBCR *I*
30.0000 mg | ORAL_TABLET | Freq: Two times a day (BID) | ORAL | Status: DC | PRN
Start: 2012-06-14 — End: 2012-06-26

## 2012-06-14 NOTE — Progress Notes (Signed)
Subjective:     Patient ID: Gloria Ray is a 57 y.o. female.    HPI  Bilateral knee pain; long acting morphine. controlled substance agreement on file, urine tox done.   Patient's medications, allergies, past medical, surgical, social and family histories were reviewed and updated as appropriate.    Review of Systems  Per above    Objective:   Physical Exam  BP 163/86  Pulse 89  Temp(Src) 35.8 C (96.5 F) (Temporal)  Ht 1.63 m (5' 4.17")  Wt 102.513 kg (226 lb)  BMI 38.58 kg/m2  General appearance: obese, alert, well appearing, and in no distress.  NECK:Supple, no lymphadenopathy, thyroid symmetrical wit no lesion or nodules appreciated.   Chest: clear to auscultation, no wheezes, rales or rhonchi, symmetric air entry.  CVS exam: normal rate, regular rhythm, normal S1, S2, no murmurs, rubs, clicks or gallops.  Exam of extremities: peripheral pulses normal, + arthritic changes to knees, + Crepitus and pain to ROM. +1 pedal edema, no clubbing or cyanosis  Neurological: Alert and oriented x3, slow, steaday  gait    Assessment/Plan:  Bilateral knee pain: vss. Urine tox within the past year, controlled substance agreement done. Will provide refills today. Patient requesting addition of short acting medication, deferred to CCP.   Vaginal itching: + DM with hx vaginal yeast infections. Not currently sexually active. Will treat with fluconazole tablet. If no improvement will return to pelvic exam.

## 2012-06-19 ENCOUNTER — Telehealth: Payer: Self-pay

## 2012-06-19 NOTE — Telephone Encounter (Signed)
Pt opened to home care/OT. Pt BP prior to morning meds was 220/120. Dow Adolph advised pt to take meds and took BP again at end of visit and numbers were down to 170/92. Pt was otherwise asymptomatic.

## 2012-06-19 NOTE — Telephone Encounter (Signed)
Pt to begin with daily aids.

## 2012-06-26 ENCOUNTER — Ambulatory Visit: Payer: Self-pay | Admitting: Family Medicine

## 2012-06-26 ENCOUNTER — Encounter: Payer: Self-pay | Admitting: Family Medicine

## 2012-06-26 VITALS — BP 155/74 | HR 88 | Ht 64.17 in | Wt 224.0 lb

## 2012-06-26 DIAGNOSIS — M25569 Pain in unspecified knee: Secondary | ICD-10-CM

## 2012-06-26 DIAGNOSIS — M1710 Unilateral primary osteoarthritis, unspecified knee: Secondary | ICD-10-CM

## 2012-06-26 DIAGNOSIS — IMO0001 Reserved for inherently not codable concepts without codable children: Secondary | ICD-10-CM

## 2012-06-26 DIAGNOSIS — E559 Vitamin D deficiency, unspecified: Secondary | ICD-10-CM

## 2012-06-26 DIAGNOSIS — J449 Chronic obstructive pulmonary disease, unspecified: Secondary | ICD-10-CM

## 2012-06-26 LAB — PROTIME-INR
INR: 1 (ref 1.0–1.2)
Protime: 10.2 s (ref 9.2–12.3)

## 2012-06-26 LAB — COMPREHENSIVE METABOLIC PANEL
ALT: 18 U/L (ref 0–35)
AST: 11 U/L (ref 0–35)
Albumin: 4.2 g/dL (ref 3.5–5.2)
Alk Phos: 91 U/L (ref 35–105)
Anion Gap: 12 (ref 7–16)
Bilirubin,Total: 0.2 mg/dL (ref 0.0–1.2)
CO2: 24 mmol/L (ref 20–28)
Calcium: 9.4 mg/dL (ref 8.6–10.2)
Chloride: 100 mmol/L (ref 96–108)
Creatinine: 0.9 mg/dL (ref 0.51–0.95)
GFR,Black: 83 *
GFR,Caucasian: 72 *
Glucose: 241 mg/dL — ABNORMAL HIGH (ref 60–99)
Lab: 14 mg/dL (ref 6–20)
Potassium: 3.8 mmol/L (ref 3.3–5.1)
Sodium: 136 mmol/L (ref 133–145)
Total Protein: 7.4 g/dL (ref 6.3–7.7)

## 2012-06-26 LAB — CBC AND DIFFERENTIAL
Baso # K/uL: 0 10*3/uL (ref 0.0–0.1)
Basophil %: 0.4 % (ref 0.1–1.2)
Eos # K/uL: 0.1 10*3/uL (ref 0.0–0.4)
Eosinophil %: 0.9 % (ref 0.7–5.8)
Hematocrit: 40 % (ref 34–45)
Hemoglobin: 12.3 g/dL (ref 11.2–15.7)
Lymph # K/uL: 3.2 10*3/uL (ref 1.2–3.7)
Lymphocyte %: 48.4 % (ref 19.3–51.7)
MCV: 80 fL (ref 79–95)
Mono # K/uL: 0.5 10*3/uL (ref 0.2–0.9)
Monocyte %: 6.9 % (ref 4.7–12.5)
Neut # K/uL: 2.9 10*3/uL (ref 1.6–6.1)
Platelets: 323 10*3/uL (ref 160–370)
RBC: 5 MIL/uL (ref 3.9–5.2)
RDW: 16.5 % — ABNORMAL HIGH (ref 11.7–14.4)
Seg Neut %: 43.4 % (ref 34.0–71.1)
WBC: 6.7 10*3/uL (ref 4.0–10.0)

## 2012-06-26 LAB — DRUG SCREEN CHEMICAL DEPENDENCY, URINE
Amphetamine,UR: NEGATIVE
Benzodiazepinen,UR: NEGATIVE
Cocaine/Metab,UR: NEGATIVE
Opiates,UR: NEGATIVE
THC Metabolite,UR: NEGATIVE

## 2012-06-26 LAB — LIPID PANEL
Chol/HDL Ratio: 5
Cholesterol: 317 mg/dL — AB
HDL: 64 mg/dL
LDL Calculated: 199 mg/dL
Non HDL Cholesterol: 253 mg/dL
Triglycerides: 270 mg/dL — AB

## 2012-06-26 MED ORDER — MORPHINE SULFATE 15 MG PO TABS *I*
15.0000 mg | ORAL_TABLET | Freq: Every day | ORAL | Status: DC
Start: 2012-06-26 — End: 2012-08-05

## 2012-06-26 MED ORDER — MORPHINE SULFATE ER 30 MG PO TBCR *I*
60.0000 mg | ORAL_TABLET | Freq: Two times a day (BID) | ORAL | Status: DC | PRN
Start: 2012-06-26 — End: 2012-08-05

## 2012-06-26 NOTE — Progress Notes (Signed)
Assessment & Plan        Gloria Ray is a 57 y.o. female and  has a past medical history of Asthma; COPD (chronic obstructive pulmonary disease); Coronary artery disease; Diabetes mellitus; Hypertension; Ischemic Stroke (03/06/2006); Prior Myocardial Infarction (03/19/2007); Unspecified cerebral artery occlusion with cerebral infarction; and Arthritis.       The patient evaluated today for: Fall, Knee Pain and Headache      Knee pain  - AMB REFERRAL TO ORTHOPEDIC SURGERY  - morphine (MS CONTIN) 30 MG 12 hr tablet; Take 2 tablets (60 mg total) by mouth 2 times daily as needed for Severe Pain   MDD 120mg .  30 day supply.  - morphine 15 MG immediate release tablet; Take 1 tablet (15 mg total) by mouth daily   MDD 15 mg  -     Plan to wean morphine over next 3-6 months as DJD addressed, clear benefit to function, weight control, and DM control in the past with monitored opiate therapy    Type II, uncontrolled  - AMB REFERRAL TO OPTOMETRY  - Microalbumin, Urine, Random  - Hemoglobin A1c  - Lipid panel    - Comprehensive metabolic panel  - Protime-INR    DJD (degenerative joint disease) of knee, bilateral  - Drug screen,UR (comprehensive)    Hypovitaminosis D  - Vitamin d 25 hydroxy, d2&d3    Chronic Obstructive Pulmonary Disease  - CBC and differential    Polypharmacy  -    Needs frequent medication review and updates.  Likely would benefit from pharmacy consult and care management as has multiple chronic illnesses with high likelihood of ED eval and/or hospitalization in next 1 year.  -     Significant history of non-adherence and social needs       Subjective        Chief Complaint: Fall, Knee Pain and Headache    HPI     Knee pain       Exacerbated by recent fall, has had limited ability to care for self at home and needing increased nursing support due to knee pain.    Type II, uncontrolled    Diabetes Health Maintenence Review       []  Patient correctly reports ABC's of Diabetes    Lab Results   Component Value  Date    HA1C 12.2* 06/26/2012    HA1C 10.7* 03/17/2012    HA1C 8.3* 05/29/2011    MALBR 47.41 03/17/2012    CREAT 0.90 06/26/2012    LDLC 199 06/26/2012     BP Readings from Last 3 Encounters:   06/26/12 155/74   06/14/12 163/86   05/08/12 153/84     HbA1c:           []  AT goal       [x]  NOT at goal     Blood pressure:       []  AT goal                [x]  NOT at goal   LDL:                          []  AT goal   [x]  NOT at goal     Microalbumin:          []  Normal     [x]  + Proteinuria                       []   Unknown   ACEi/ARB Rx:           [x]  Yes          []  No   []  N/A      Immunization History   Administered Date(s) Administered   . Influenza Adult(19yr and up) 03/29/2011, 12/01/2011   . PPD Test 03/17/2012, 04/24/2012   . Pneumococcal Polysaccharide (Pneumovax) 03/29/2011     Immunizations:        Influenza              [x]  Up to date            []  Needed  []  Refused        Pneumovax          [x]  Up to date            []  Needed  []  Refused        HepB                    []  Up to date            []  Needed  []  Refused     Health Maintenance   Topic Date Due   . Ophthalmology Exam  12/31/1966   . Hiv Testing Offered  12/30/1969   . Hepatitis C Screening Offered  12/30/2004   . Foot Exam  05/23/2012   . Imm-influenza 9 Y + Up  11/04/2012   . Hemoglobin A1c  12/26/2012   . Urine Microalbumin  03/17/2013   . Lipid Disorder Screening  06/26/2013   . Imm-pneumococcal Vaccine Age 80-64 High Risk  Completed     Foot Exam:               [x]  Up to date  (done today)  []  Needed  []  Refused   Eye  Exam:               []  Up to date            [x]  Needed  []  Refused       Requested Referrals:          []  None        []  Diabetes Education        [x]  Ophthalmology     []  Podiatry       DJD (degenerative joint disease) of knee, bilateral      Has not followed up with orthopedic surgeon to date.  S/p steroid injections that did not help.    Hypovitaminosis D       Has not been tested in a while, concerned about this    Chronic Obstructive  Pulmonary Disease       Currently stable in new living facility, better since leaving [facility].  Taking all medications as prescribed.        Pertinent positives and negatives of a 12-point review of systems noted above.       Objective   Recorded Vital Signs:  BP 155/74  Pulse 88  Ht 1.63 m (5' 4.17")  Wt 101.606 kg (224 lb)  BMI 38.24 kg/m2   Physical Exam   VITALS: Reviewed above and hypertensive.  BMI category is moderate obesity.  GEN: Well developed, well nourished, appears stated age, no acute distress  HEENT: Atraumatic.  EOM normal, conjunctiva clear.  Moist mucus membranes, no oropharyngeal lesions.  Trachea midline, no lymphadenopathy or thyromegaly.  PULM: Lungs are clear to auscultation bilaterally with good effort.  Baseline breathlessness with conversation.  CV: Regular rate, S1, S2.  No S3, S4, murmurs or rubs.   ABD: Bowel sounds normal.  Soft, non-tender, obese.  EXT: Warm and well perfused.  No edema.  Diabetic foot exam showing sensate foot without wounds or hypertrophied skin.  SKIN: Warm and dry. No rashes, ecchymoses, or petechiae.  NEURO:  Alert.  Cranial nerves grossly intact.  Strength is normal and symmetric.  Gait is normal.  Balance is normal.    PSYCH:  Patient well dressed and well groomed, sitting in chair.  Speech is normal.  Mood is anxious.  Affect is congruent with mood.  Thought content is linear with no expressed paranoia, delusions, or hallucinations; no suicidal or homicidal ideation.  Judgement is poor.  Insight is poor.      Note in APSO format, see top for Assessment & Plan.     [name] MD, PhD   R2 [facility]   06/26/2012  5:55 PM       --------------------    Preceptor Attestation - [facility]     I have reviewed the history, exam findings, and plan of care and discussed the care plan with the resident at the time of the visit. I agree with the resident's findings and plan of care as documented above.    Laurena Spies, MD

## 2012-06-27 LAB — DRUG SCREEN PANEL, EMERGENCY
Amphetamine,UR: NEGATIVE
Barbiturate,UR: NEGATIVE
Benzodiazepinen,UR: NEGATIVE
Cocaine/Metab,UR: NEGATIVE
Opiates,UR: NEGATIVE
Propoxyphene,UR: NEGATIVE
THC Metabolite,UR: NEGATIVE

## 2012-06-27 LAB — HEMOGLOBIN A1C: Hemoglobin A1C: 12.2 % — ABNORMAL HIGH (ref 4.0–6.0)

## 2012-06-28 ENCOUNTER — Telehealth: Payer: Self-pay | Admitting: Family Medicine

## 2012-06-28 NOTE — Telephone Encounter (Signed)
He was actually seen by Dr Juliane Poot.

## 2012-06-28 NOTE — Telephone Encounter (Signed)
Took a call yesterday from OT; pt's BP 170/110 and they have to call and let us know.  Was just seen by Dr. Jana Hakim yesterday.  I Will let him know

## 2012-06-28 NOTE — Telephone Encounter (Signed)
Will send to [facility]; OT told me Brett Canales had seen him

## 2012-07-01 ENCOUNTER — Encounter: Payer: Self-pay | Admitting: Family Medicine

## 2012-07-01 ENCOUNTER — Encounter: Payer: Self-pay | Admitting: Gastroenterology

## 2012-07-01 ENCOUNTER — Telehealth: Payer: Self-pay

## 2012-07-01 LAB — VITAMIN D
25-OH VIT D2: 11 ng/mL
25-OH VIT D3: 16 ng/mL
25-OH Vit Total: 27 ng/mL — ABNORMAL LOW (ref 30–60)

## 2012-07-01 NOTE — Telephone Encounter (Signed)
Calling to report pt c/o pain in knees 8/10. Asking about referral and appt w/[facility]. Message left re: 5/20 appt with Dr. Titus Mould.     Questions of concern that need addressing, please:    1) Should pt be taking 90 units of Lantus daily?    2) has a script been faxed for signature from [facility] for a shower chair and a new walker for the pt? Scripts need to be done for these items, please

## 2012-07-03 ENCOUNTER — Telehealth: Payer: Self-pay

## 2012-07-03 NOTE — Telephone Encounter (Signed)
Pt c/o swelling in legs, but pt has NO pitting edema, lungs are clear to auscultation and is sating at 97% though she does c/o SOB upon exertion. Pt BP is 180/100. If any additional BP meds are to be taken by pt d/t this reading please have RN notify pt. Thank You!!!

## 2012-07-03 NOTE — Telephone Encounter (Signed)
[facility] pharmacist says, insurance won't cover current morphine order as written.  Instead of 30mg  tabs ( 60mg  total ) bid , they want ( 60 mg tabs ) bid. Thanks.

## 2012-07-04 ENCOUNTER — Other Ambulatory Visit: Payer: Self-pay | Admitting: Family Medicine

## 2012-07-09 ENCOUNTER — Telehealth: Payer: Self-pay

## 2012-07-09 ENCOUNTER — Other Ambulatory Visit: Payer: Self-pay | Admitting: Family Medicine

## 2012-07-09 NOTE — Telephone Encounter (Signed)
Pt does not have any Crestor in the house per HCR. Is this prescribed by [facility] or her cardiologist. If by [place] can a script please be done. Thank You!!!

## 2012-07-09 NOTE — Telephone Encounter (Signed)
HCR has suggested to pt that she get tele-health so they can keep track of her BP's as they have been rather high. She has been taking her amlodipine every day as she's supposed to. One day she had a very bad HA and took an extra one and her HA went away and she is wondering if maybe increasing her dose of that medication may be beneficial.  Please advise [id number] pool so pt can be notified. Thank You!!!

## 2012-07-10 ENCOUNTER — Telehealth: Payer: Self-pay

## 2012-07-10 NOTE — Telephone Encounter (Signed)
Melissa asking if pt is to still be taking Prednisone 40 mg total, daily and states if she is, she believes she has one dose left and will need refills ordered.  Melissa also asking about pt's Lantus dosing, Melissa states she has pt shouldbe taking 80 units nightly, pt states she is taking 90 units nightly; script reads:   insulin glargine (LANTUS SOLOSTAR) 100 UNIT/ML injection pen 5 each 11 03/20/2012 04/19/2012 Sig - Route: Inject 85 Units into the skin nightly May increase by 1 Unit daily until morning blood glucose is less than 120. Maximum 95 Units/day - Subcutaneous   Melissa informed of Lantus RX admin. Instructions; please note script end date is 04/19/12.  Please advise [address] RN so HCR nurse can be notified.  Thank you!

## 2012-07-11 ENCOUNTER — Other Ambulatory Visit: Payer: Self-pay | Admitting: Family Medicine

## 2012-07-12 ENCOUNTER — Encounter: Payer: Self-pay | Admitting: Gastroenterology

## 2012-07-15 ENCOUNTER — Telehealth: Payer: Self-pay | Admitting: Family Medicine

## 2012-07-15 DIAGNOSIS — G4700 Insomnia, unspecified: Secondary | ICD-10-CM

## 2012-07-15 DIAGNOSIS — T17998A Other foreign object in respiratory tract, part unspecified causing other injury, initial encounter: Secondary | ICD-10-CM

## 2012-07-15 DIAGNOSIS — F32A Depression, unspecified: Secondary | ICD-10-CM

## 2012-07-15 MED ORDER — TRAZODONE HCL 100 MG PO TABS *I*
ORAL_TABLET | ORAL | Status: DC
Start: 2012-07-15 — End: 2012-12-05

## 2012-07-16 ENCOUNTER — Telehealth: Payer: Self-pay

## 2012-07-16 DIAGNOSIS — J449 Chronic obstructive pulmonary disease, unspecified: Secondary | ICD-10-CM

## 2012-07-16 DIAGNOSIS — M1710 Unilateral primary osteoarthritis, unspecified knee: Secondary | ICD-10-CM

## 2012-07-16 DIAGNOSIS — F32A Depression, unspecified: Secondary | ICD-10-CM

## 2012-07-16 NOTE — Telephone Encounter (Signed)
Pt ALSO needs a new script for pen needles for solostar and humalog. Pt is testing 4Xdaily and script needs to reflect that, please.

## 2012-07-16 NOTE — Telephone Encounter (Signed)
Pt insurance is now requiring pt to use [employer]estyle Lite monitoring, lancets and test strips. New scripts por these items are needed please.

## 2012-07-17 ENCOUNTER — Other Ambulatory Visit: Payer: Self-pay | Admitting: Family Medicine

## 2012-07-17 ENCOUNTER — Encounter: Payer: Self-pay | Admitting: Family Medicine

## 2012-07-17 DIAGNOSIS — I2510 Atherosclerotic heart disease of native coronary artery without angina pectoris: Secondary | ICD-10-CM

## 2012-07-17 MED ORDER — PREDNISONE 20 MG PO TABS *I*
40.0000 mg | ORAL_TABLET | Freq: Every day | ORAL | Status: DC | PRN
Start: 2012-07-17 — End: 2012-07-30

## 2012-07-17 MED ORDER — FREESTYLE LITE TEST VI STRP *A*
ORAL_STRIP | Status: DC
Start: 2012-07-17 — End: 2013-06-11

## 2012-07-17 MED ORDER — INSULIN LISPRO (HUMAN) 100 UNIT/ML SC SOPN *I*
0.0000 [IU] | PEN_INJECTOR | Freq: Four times a day (QID) | SUBCUTANEOUS | Status: DC
Start: 2012-07-17 — End: 2013-05-19

## 2012-07-17 MED ORDER — LOSARTAN POTASSIUM-HCTZ 100-25 MG PO TABS *A*
1.0000 | ORAL_TABLET | Freq: Every morning | ORAL | Status: DC
Start: 2012-07-17 — End: 2012-12-11

## 2012-07-17 MED ORDER — FREESTYLE LANCETS MISC *A*
Status: AC
Start: 2012-07-17 — End: ?

## 2012-07-17 MED ORDER — INSULIN GLARGINE 100 UNIT/ML SC SOPN *I*
90.0000 [IU] | PEN_INJECTOR | Freq: Every evening | SUBCUTANEOUS | Status: DC
Start: 2012-07-17 — End: 2012-08-05

## 2012-07-17 MED ORDER — IPRATROPIUM-ALBUTEROL 0.5-2.5 MG/3ML IN SOLN *I*
3.0000 mL | Freq: Four times a day (QID) | RESPIRATORY_TRACT | Status: DC | PRN
Start: 2012-07-17 — End: 2012-07-30

## 2012-07-17 MED ORDER — FREESTYLE LITE DEVI *A*
Status: AC
Start: 2012-07-17 — End: ?

## 2012-07-17 MED ORDER — INSULIN PEN NEEDLE 31G X 5 MM MISC *A*
Status: AC
Start: 2012-07-17 — End: ?

## 2012-07-17 MED ORDER — METOPROLOL SUCCINATE 50 MG PO TB24 *I*
100.0000 mg | ORAL_TABLET | Freq: Every day | ORAL | Status: DC
Start: 2012-07-17 — End: 2012-08-12

## 2012-07-17 MED ORDER — ADJUST BATH/SHOWER SEAT MISC *A*
Status: AC
Start: 2012-07-17 — End: ?

## 2012-07-17 MED ORDER — WALKER MISC *A*
Status: AC
Start: 2012-07-17 — End: ?

## 2012-07-17 MED ORDER — ROSUVASTATIN CALCIUM 40 MG PO TABS *I*
40.0000 mg | ORAL_TABLET | Freq: Every day | ORAL | Status: DC
Start: 2012-07-17 — End: 2012-11-26

## 2012-07-17 MED ORDER — BUSPIRONE HCL 30 MG PO TABS *I*
30.0000 mg | ORAL_TABLET | Freq: Two times a day (BID) | ORAL | Status: DC
Start: 2012-07-17 — End: 2012-08-19

## 2012-07-17 NOTE — Telephone Encounter (Signed)
Assessment & Plan        Gloria Ray is a 57 y.o. female and  has a past medical history of Asthma; COPD (chronic obstructive pulmonary disease); Coronary artery disease; Diabetes mellitus; Hypertension; Ischemic Stroke (03/06/2006); Prior Myocardial Infarction (03/19/2007); Unspecified cerebral artery occlusion with cerebral infarction; and Arthritis.       The patient evaluated today for: Medication Problem/Question      Diabetes mellitus Type II, uncontrolled.       Needs office visit for comprehensive DM review, ordering medications and supplies as requested by nursing and pharmacy  - blood glucose monitor (FREESTYLE LITE) kit; Use as instructed  - FREESTYLE LITE test strip; Test 4 times daily as instructed.  ICD9 250.02; Diabetes mellitus, uncontrolled.  - lancets (FREESTYLE); Use 4 times daily as instructed for blood glucose testing.  ICD9 250.02; Diabetes mellitus, uncontrolled.  - insulin lispro (HUMALOG KWIKPEN) 100 UNIT/ML injection pen; Inject 0-30 Units into the skin 4 times daily (before meals and nightly)   according to sliding scale.  - insulin pen needle (BD ULTRA-FINE PEN NEEDLE MINI) 31G X 5 MM; Use 4 times daily as instructed with Humalog pen.  ICD-9 250.02  Diabetes mellitus, uncontrolled.  - insulin pen needle (BD ULTRA-FINE PEN NEEDLE MINI) 31G X 5 MM; Use nightly as instructed with Lantus pen.  ICD-9 250.02  Diabetes mellitus, uncontrolled.  - insulin glargine (LANTUS SOLOSTAR) 100 UNIT/ML injection pen; Inject 90 Units into the skin nightly  -     Consider referral to endocrinology now that patient is adherent to attending scheduled appointments    Hypertension       Needs office visit for review of HTN and CAD.  In the interim has repeat high BPs, will increase metoprolol and add HCTZ to losartan and adjust further at next visit or based on data from home nursing.  - metoprolol (TOPROL-XL) 50 MG 24 hr tablet; Take 2 tablets (100 mg total) by mouth daily   Do not crush or chew. May be  divided.  - losartan-hydrochlorothiazide (HYZAAR) 100-25 MG per tablet; Take 1 tablet by mouth every morning  -     Needs Cardiology referral/visit for review and optimization of chronic cardiac conditions at next visit now that patient is adherent to attending scheduled appointments    DJD (degenerative joint disease) of knee, bilateral       Anticipating visit with Ortho to consider candidacy for knee replacements  - adjust bath/shower seat; Use as directed  - walker; Use as directed    Anxiety and depression       Refilling medication, changing from 10mg  tabs (x3) to 30mg  tab BID  - busPIRone (BUSPAR) 30 MG tablet; Take 1 tablet (30 mg total) by mouth 2 times daily    Chronic Obstructive Pulmonary Disease       Frequent exacerbations, may treat mild exacerbations at home with physician guidance.  - predniSONE (DELTASONE) 20 MG tablet; Take 2 tablets (40 mg total) by mouth daily as needed   for 5 days course during COPD exacerbations  - ipratropium-albuterol (DUONEB) 0.5-2.5 (3) MG/3ML nebulizer solution; Take 3 mLs by nebulization 4 times daily as needed for Wheezing or Shortness of Breath (COPD exacerbation)   CALL YOUR DOCTOR IF USING       Subjective        Chief Complaint: Medication Problem/Question    HPI     Refill requests and medication clarification requested by nursing and pharmacy     Objective  BP Readings from Last 7 Encounters:   06/26/12 155/74   06/14/12 163/86   05/08/12 153/84   03/29/12 186/83   03/20/12 150/90   03/14/12 149/89   02/21/12 144/90     Systolic BPs of 170s-180s reported by home nursing      Note in APSO format, see top for Assessment & Plan.     [name] MD, PhD   R2 [facility]   07/17/2012  10:57 AM

## 2012-07-22 ENCOUNTER — Other Ambulatory Visit: Payer: Self-pay | Admitting: Family Medicine

## 2012-07-22 ENCOUNTER — Encounter: Payer: Self-pay | Admitting: Gastroenterology

## 2012-07-23 ENCOUNTER — Other Ambulatory Visit: Payer: Self-pay | Admitting: Family Medicine

## 2012-07-23 ENCOUNTER — Ambulatory Visit: Payer: Self-pay

## 2012-07-23 ENCOUNTER — Telehealth: Payer: Self-pay

## 2012-07-23 ENCOUNTER — Encounter: Payer: Self-pay | Admitting: Sports Medicine

## 2012-07-23 ENCOUNTER — Ambulatory Visit: Payer: Self-pay | Admitting: Sports Medicine

## 2012-07-23 VITALS — BP 152/73 | Ht 64.0 in | Wt 230.0 lb

## 2012-07-23 DIAGNOSIS — M1710 Unilateral primary osteoarthritis, unspecified knee: Secondary | ICD-10-CM

## 2012-07-23 NOTE — Telephone Encounter (Signed)
Pt BP continues to be often out of parameter. 5/19 AM: 142/88  PM: 143/91 5/20 AM: 163/97. Pt remains asymptomatic though admits to SOB upon exertion. This is more of an FYI call as they will continue to monitor and call with abnormal reads.

## 2012-07-23 NOTE — Patient Instructions (Signed)
[facility]      Your physician has determined that you require Prefabricated (off-the-shelf) Orthotic and Prosthetic (O&P) devices and/or Durable Medical Equipment (DME).  O&P devices include items such as braces for the spine or limbs, while DME includes items such as canes, crutches and walkers.  For your convenience you were fitted by the [facility].    Return Policy:    Prefabricated O&P and DME items are not returnable if used outside of clinic due to hygiene concerns.    Special or custom orders are not returnable or refundable.    O&P devices and DME purchased from the [facility] may only be exchanged if the item is faulty or poorly fitting, as determined by the O&P Department Clinical Coordinator.  Exchanges will be made using the same type of device, if within 7 days of the purchase date.    No exchange will be issued for items that were not used in accordance with the manufacturer’s recommended standard of care.    Replacement or repair of minor parts could become necessary due to wear.  This may include a charge and an order from you physician may be required.

## 2012-07-23 NOTE — Progress Notes (Signed)
Walk-in Visit    Patient name: Gloria Ray     Pain    07/23/12 1238   PainSc:  10 - Worst pain ever       Laterality: bilateral    Device:  Drytex Hinged Knee Wrap    Functional Goals: Provide joint stability    ROM settings: Not Applicable    Expected Frequency / Duration of Use:   Daily    Brand: Procare                 Model: Reddie Hinged Knee Wrap    Size: large    Modifications made: Not Applicable    Complexity:   Fitting was routine, requiring little to no adjustments    Yes Device was inspected for safety/security and found to be functioning properly    Ordered/Delivered: Device Delivered    The patient given verbal and written instructions.  The following Instructions were reviewed:   Purpose of device  Cleaning / Care of device  Potential Risks / Benefits  Frequency / Duration of use  How to report potential failure / malfunctions

## 2012-07-23 NOTE — Progress Notes (Signed)
HISTORY OF PRESENT ILLNESS    Gloria Ray is a 57 y.o. female who was referred by CCP for evaluation and treatment of a bilateral knee pain. The pain began 1 year ago. No preceded trauma is reported. The pain is located diffusely in bilateral knees. Currently she takes morphine ER 60 mg BID, ibuprofen 600 mg 2-3 times daily and tylenol 2-3 times daily without satisfactory pain relief. Heat pack improve the pain for a short while. She takes prednisone 40 mg daily for asthma. Previously she took naproxin and meloxicam without improvement. On chart review, gabapentin was prescribed on 07/04/12, however, she is unsure if she currently takes it. She received corticosteroid knee injection in right knee without pain relief. She has not tried PT in the past.     OBJECTIVE:   Vital signs:  Noted in chart  General: Well developed, well nourished, no acute distress.   Mental Status:  Alert and oriented x3, pleasant  Extremities: no swelling, erythema ecchymoses or edema  Gait: Nonantalgic, no limping.  KNEE:  No effusion, full active range of motion, Tenderness to palpation diffusely on bilateral joint line, ligamentous structures intact. No swelling, erythema echymoses or deformity. No palpable effusion or warmth. Nontender to palpation. Diminished bilateral dorsal pedis pulses. Stable to ligamentous testing with negative anterior drawer, posterior drawer, valgus and varus stress.     IMAGING  Knee x-ray (May 2013) : Mild arthritic changes in bilateral knee.     ASSESSMENT  Osteoarthritis of knee, bilateral    PLAN  - Start PT at least for 6-8 weeks  - Knee brace ordered  - Ibuprofen   - Consider MRI to rule out insufficiency fracture given age and steroid use if no improvement of 6-8 week of PT   - She is not a good candidate for total knee replacement since arthritic change appears mild on X-ray and considering her comorbidities including uncontrolled DM and asthma.   - Follow up in 6-8 weeks. Will consider MRI at that  time.     Yukiko Simoneaux MB.  FMR3     Attestation:    I saw and evaluated the patient. I have reviewed and edited the resident's/fellow's note and confirm the findings and plan of care as documented above.    Gwendolyn Grant, MD, FAAFP  Sports Medicine Medicine Attending Physician

## 2012-07-24 ENCOUNTER — Encounter: Payer: Self-pay | Admitting: Gastroenterology

## 2012-07-25 ENCOUNTER — Telehealth: Payer: Self-pay | Admitting: Primary Care

## 2012-07-25 NOTE — Progress Notes (Signed)
Quick Note:    A: Abnormal results noted, non-urgent.  P:   - May discuss normal and abnormal values at next visit   - May call/MyChart message MD with questions/concerns  ______

## 2012-07-25 NOTE — Telephone Encounter (Signed)
Hello,    Patient said can you do a script for a wheelchair wi/ legs and send it to [employer] wheel chair    Thanks,

## 2012-07-29 ENCOUNTER — Encounter: Payer: Self-pay | Admitting: Emergency Medicine

## 2012-07-29 ENCOUNTER — Telehealth: Payer: Self-pay | Admitting: Primary Care

## 2012-07-29 ENCOUNTER — Other Ambulatory Visit: Payer: Self-pay | Admitting: Gastroenterology

## 2012-07-29 DIAGNOSIS — R0789 Other chest pain: Secondary | ICD-10-CM | POA: Diagnosis present

## 2012-07-29 LAB — CBC AND DIFFERENTIAL
Baso # K/uL: 0 10*3/uL (ref 0.0–0.1)
Basophil %: 0.6 % (ref 0.1–1.2)
Eos # K/uL: 0 10*3/uL (ref 0.0–0.4)
Eosinophil %: 0.8 % (ref 0.7–5.8)
Hematocrit: 38 % (ref 34–45)
Hemoglobin: 11.8 g/dL (ref 11.2–15.7)
Lymph # K/uL: 2.1 10*3/uL (ref 1.2–3.7)
Lymphocyte %: 43.4 % (ref 19.3–51.7)
MCV: 79 fL (ref 79–95)
Mono # K/uL: 0.3 10*3/uL (ref 0.2–0.9)
Monocyte %: 6.8 % (ref 4.7–12.5)
Neut # K/uL: 2.4 10*3/uL (ref 1.6–6.1)
Platelets: 231 10*3/uL (ref 160–370)
RBC: 4.8 MIL/uL (ref 3.9–5.2)
RDW: 17.4 % — ABNORMAL HIGH (ref 11.7–14.4)
Seg Neut %: 48.2 % (ref 34.0–71.1)
WBC: 4.9 10*3/uL (ref 4.0–10.0)

## 2012-07-29 LAB — VENOUS BLOOD GAS
Base Excess,VENOUS: 2
Bicarbonate,VENOUS: 28 mmol/L (ref 22–30)
CO2 (Calc),VENOUS: 30 mmol/L (ref 22–31)
CO: 1 % (ref 0.0–1.4)
FO2 HB,VENOUS: 66 % (ref 63–83)
Hemoglobin: 12.8 g/dL (ref 11.2–15.7)
Methemoglobin: 0.3 % (ref 0.0–1.0)
PCO2,VENOUS: 48 mm Hg (ref 40–50)
PH,VENOUS: 7.38 (ref 7.32–7.42)
PO2,VENOUS: 37 mm Hg (ref 25–43)

## 2012-07-29 LAB — COMPREHENSIVE METABOLIC PANEL
ALT: 17 U/L (ref 0–35)
AST: 10 U/L (ref 0–35)
Albumin: 3.9 g/dL (ref 3.5–5.2)
Alk Phos: 81 U/L (ref 35–105)
Anion Gap: 11 (ref 7–16)
Bilirubin,Total: 0.3 mg/dL (ref 0.0–1.2)
CO2: 25 mmol/L (ref 20–28)
Calcium: 8.8 mg/dL (ref 8.6–10.2)
Chloride: 100 mmol/L (ref 96–108)
Creatinine: 1.08 mg/dL — ABNORMAL HIGH (ref 0.51–0.95)
GFR,Black: 67 *
GFR,Caucasian: 58 * — AB
Globulin: 2.8 g/dL (ref 2.7–4.3)
Glucose: 322 mg/dL — ABNORMAL HIGH (ref 60–99)
Lab: 16 mg/dL (ref 6–20)
Potassium: 3.9 mmol/L (ref 3.3–5.1)
Sodium: 136 mmol/L (ref 133–145)
Total Protein: 6.7 g/dL (ref 6.3–7.7)

## 2012-07-29 LAB — NT-PRO BNP: NT-pro BNP: <50 pg/mL (ref 0–900)

## 2012-07-29 LAB — POCT GLUCOSE
Glucose POCT: 372 mg/dL — ABNORMAL HIGH (ref 60–99)
Glucose POCT: 213 mg/dL — ABNORMAL HIGH (ref 60–99)

## 2012-07-29 LAB — LACTATE, VENOUS, WHOLE BLOOD: Lactate VEN,WB: 1.8 mmol/L (ref 0.5–2.2)

## 2012-07-29 LAB — CK ISOENZYMES
CK: 216 U/L — ABNORMAL HIGH (ref 34–145)
Mass CKMB: 3.6 ng/mL — ABNORMAL HIGH (ref 0.0–2.9)
Relative Index: 1.7 % (ref 0.0–5.0)

## 2012-07-29 LAB — TSH: TSH: 1.48 u[IU]/mL (ref 0.27–4.20)

## 2012-07-29 LAB — HOLD GREEN WITH GEL

## 2012-07-29 LAB — MAGNESIUM: Magnesium: 1.4 mEq/L (ref 1.3–2.1)

## 2012-07-29 LAB — TROPONIN T
Troponin T: <0.01 ng/mL (ref 0.00–0.02)
Troponin T: <0.01 ng/mL (ref 0.00–0.02)

## 2012-07-29 LAB — HOLD BLUE

## 2012-07-29 MED ORDER — LOSARTAN POTASSIUM 25 MG PO TABS *I*
100.0000 mg | ORAL_TABLET | Freq: Every day | ORAL | Status: DC
Start: 2012-07-29 — End: 2012-08-05
  Administered 2012-07-30 – 2012-08-05 (×7): 100 mg via ORAL
  Filled 2012-07-29 (×8): qty 4

## 2012-07-29 MED ORDER — ALBUTEROL SULFATE (2.5 MG/3ML) 0.083% IN NEBU *I*
7.5000 mg | INHALATION_SOLUTION | RESPIRATORY_TRACT | Status: DC
Start: 2012-07-29 — End: 2012-07-29
  Administered 2012-07-29: 7.5 mg via RESPIRATORY_TRACT
  Filled 2012-07-29: qty 9

## 2012-07-29 MED ORDER — BUSPIRONE HCL 15 MG PO TABS *I*
30.0000 mg | ORAL_TABLET | Freq: Two times a day (BID) | ORAL | Status: DC
Start: 2012-07-29 — End: 2012-08-05
  Administered 2012-07-29 – 2012-08-05 (×14): 30 mg via ORAL
  Filled 2012-07-29 (×17): qty 2

## 2012-07-29 MED ORDER — GABAPENTIN 300 MG PO CAPS
300.0000 mg | ORAL_CAPSULE | Freq: Every evening | ORAL | Status: DC
Start: 2012-07-29 — End: 2012-08-05
  Filled 2012-07-29 (×7): qty 1

## 2012-07-29 MED ORDER — METOPROLOL SUCCINATE 100 MG PO TB24 *I*
100.0000 mg | ORAL_TABLET | Freq: Every day | ORAL | Status: DC
Start: 2012-07-29 — End: 2012-08-05
  Administered 2012-07-30 – 2012-08-05 (×7): 100 mg via ORAL
  Filled 2012-07-29 (×2): qty 1
  Filled 2012-07-29 (×3): qty 2
  Filled 2012-07-29: qty 1
  Filled 2012-07-29: qty 2

## 2012-07-29 MED ORDER — IPRATROPIUM-ALBUTEROL 0.5-2.5 MG/3ML IN SOLN *I*
3.0000 mL | Freq: Four times a day (QID) | RESPIRATORY_TRACT | Status: DC
Start: 2012-07-29 — End: 2012-07-29

## 2012-07-29 MED ORDER — MORPHINE SULFATE 15 MG PO TABS *I*
15.0000 mg | ORAL_TABLET | Freq: Every day | ORAL | Status: DC
Start: 2012-07-29 — End: 2012-08-02
  Administered 2012-07-29 – 2012-08-02 (×5): 15 mg via ORAL
  Filled 2012-07-29 (×5): qty 1

## 2012-07-29 MED ORDER — PANTOPRAZOLE SODIUM 40 MG PO TBEC *I*
40.0000 mg | DELAYED_RELEASE_TABLET | Freq: Every morning | ORAL | Status: DC
Start: 2012-07-30 — End: 2012-08-03
  Administered 2012-07-30 – 2012-08-03 (×5): 40 mg via ORAL
  Filled 2012-07-29 (×5): qty 1

## 2012-07-29 MED ORDER — IPRATROPIUM BROMIDE 0.02 % IN SOLN *I*
500.0000 ug | RESPIRATORY_TRACT | Status: DC
Start: 2012-07-29 — End: 2012-07-29
  Administered 2012-07-29: 500 ug via RESPIRATORY_TRACT
  Filled 2012-07-29: qty 2.5

## 2012-07-29 MED ORDER — GLUCOSE 40 % PO GEL *I*
15.0000 g | Freq: Once | ORAL | Status: AC | PRN
Start: 2012-07-29 — End: 2012-07-29

## 2012-07-29 MED ORDER — ALBUTEROL SULFATE HFA 108 (90 BASE) MCG/ACT IN AERS *I*
2.0000 | INHALATION_SPRAY | RESPIRATORY_TRACT | Status: DC
Start: 2012-07-29 — End: 2012-07-30
  Administered 2012-07-29 – 2012-07-30 (×6): 2 via RESPIRATORY_TRACT
  Filled 2012-07-29: qty 8

## 2012-07-29 MED ORDER — TRAZODONE HCL 50 MG PO TABS *I*
300.0000 mg | ORAL_TABLET | Freq: Every evening | ORAL | Status: DC | PRN
Start: 2012-07-29 — End: 2012-08-05
  Filled 2012-07-29 (×7): qty 6

## 2012-07-29 MED ORDER — HYDROCHLOROTHIAZIDE 25 MG PO TABS *I*
12.5000 mg | ORAL_TABLET | Freq: Every day | ORAL | Status: DC
Start: 2012-07-29 — End: 2012-08-05
  Administered 2012-07-30 – 2012-08-05 (×7): 12.5 mg via ORAL
  Filled 2012-07-29 (×7): qty 1

## 2012-07-29 MED ORDER — INSULIN LISPRO (HUMAN) 100 UNIT/ML IJ/SC SOLN *WRAPPED*
30.0000 [IU] | Freq: Three times a day (TID) | SUBCUTANEOUS | Status: DC
Start: 2012-07-29 — End: 2012-07-31
  Administered 2012-07-29 – 2012-07-31 (×6): 30 [IU] via SUBCUTANEOUS

## 2012-07-29 MED ORDER — METHYLPREDNISOLONE SOD SUCC 125 MG IJ SOLR(62.5MG/ML) *WRAPPED*
125.0000 mg | Freq: Once | INTRAMUSCULAR | Status: AC
Start: 2012-07-29 — End: 2012-07-29
  Administered 2012-07-29: 125 mg via INTRAVENOUS
  Filled 2012-07-29: qty 2

## 2012-07-29 MED ORDER — FLUCONAZOLE 100 MG PO TABS *I*
150.0000 mg | ORAL_TABLET | Freq: Once | ORAL | Status: AC
Start: 2012-07-29 — End: 2012-07-29
  Administered 2012-07-29: 150 mg via ORAL
  Filled 2012-07-29: qty 2

## 2012-07-29 MED ORDER — ACETAMINOPHEN 500 MG PO TABS *I*
1000.0000 mg | ORAL_TABLET | Freq: Three times a day (TID) | ORAL | Status: DC | PRN
Start: 2012-07-29 — End: 2012-07-31
  Filled 2012-07-29 (×3): qty 2

## 2012-07-29 MED ORDER — MORPHINE SULFATE ER 30 MG PO TBCR *I*
60.0000 mg | ORAL_TABLET | Freq: Two times a day (BID) | ORAL | Status: DC | PRN
Start: 2012-07-29 — End: 2012-08-03
  Filled 2012-07-29: qty 2
  Filled 2012-07-29 (×5): qty 1

## 2012-07-29 MED ORDER — HEPARIN SODIUM 5000 UNIT/ML SQ *I*
5000.0000 [IU] | Freq: Three times a day (TID) | SUBCUTANEOUS | Status: DC
Start: 2012-07-29 — End: 2012-07-30
  Administered 2012-07-29 – 2012-07-30 (×2): 5000 [IU] via SUBCUTANEOUS
  Filled 2012-07-29 (×2): qty 1

## 2012-07-29 MED ORDER — SODIUM CHLORIDE 0.9 % IV SOLN WRAPPED *I*
50.0000 mL/h | Status: DC
Start: 2012-07-29 — End: 2012-07-30
  Administered 2012-07-29 – 2012-07-30 (×2): 50 mL/h via INTRAVENOUS

## 2012-07-29 MED ORDER — PREDNISONE 20 MG PO TABS *I*
60.0000 mg | ORAL_TABLET | Freq: Every day | ORAL | Status: DC
Start: 2012-07-29 — End: 2012-08-05
  Administered 2012-07-30 – 2012-08-05 (×7): 60 mg via ORAL
  Filled 2012-07-29 (×3): qty 3
  Filled 2012-07-29 (×4): qty 6

## 2012-07-29 MED ORDER — IPRATROPIUM-ALBUTEROL 18-103 MCG/ACT IN AERO *I*
2.0000 | INHALATION_SPRAY | Freq: Four times a day (QID) | RESPIRATORY_TRACT | Status: DC
Start: 2012-07-29 — End: 2012-07-30
  Administered 2012-07-29 – 2012-07-30 (×3): 2 via RESPIRATORY_TRACT
  Filled 2012-07-29: qty 14.7

## 2012-07-29 MED ORDER — AMLODIPINE BESYLATE 5 MG PO TABS *I*
10.0000 mg | ORAL_TABLET | Freq: Every day | ORAL | Status: DC
Start: 2012-07-29 — End: 2012-08-05
  Filled 2012-07-29 (×7): qty 2

## 2012-07-29 MED ORDER — INSULIN GLARGINE 100 UNIT/ML SC SOLN *WRAPPED*
90.0000 [IU] | Freq: Every evening | SUBCUTANEOUS | Status: DC
Start: 2012-07-29 — End: 2012-07-31
  Administered 2012-07-29 – 2012-07-30 (×2): 90 [IU] via SUBCUTANEOUS

## 2012-07-29 MED ORDER — IBUPROFEN 600 MG PO TABS *I*
600.0000 mg | ORAL_TABLET | Freq: Four times a day (QID) | ORAL | Status: DC | PRN
Start: 2012-07-29 — End: 2012-08-05
  Filled 2012-07-29: qty 1
  Filled 2012-07-29: qty 3
  Filled 2012-07-29: qty 1

## 2012-07-29 MED ORDER — SODIUM CHLORIDE 0.9 % IV SOLN WRAPPED *I*
125.0000 mL/h | Status: DC
Start: 2012-07-29 — End: 2012-07-29
  Administered 2012-07-29: 125 mL/h via INTRAVENOUS

## 2012-07-29 MED ORDER — ASPIRIN 325 MG PO TABS *I*
325.0000 mg | ORAL_TABLET | Freq: Every day | ORAL | Status: DC
Start: 2012-07-29 — End: 2012-08-05
  Administered 2012-07-30 – 2012-08-05 (×7): 325 mg via ORAL
  Filled 2012-07-29 (×7): qty 1

## 2012-07-29 MED ORDER — CLOPIDOGREL BISULFATE 75 MG PO TABS *I*
75.0000 mg | ORAL_TABLET | Freq: Every day | ORAL | Status: DC
Start: 2012-07-29 — End: 2012-08-05
  Administered 2012-07-30 – 2012-08-05 (×7): 75 mg via ORAL
  Filled 2012-07-29 (×7): qty 1

## 2012-07-29 MED ORDER — INSULIN LISPRO (HUMAN) 100 UNIT/ML IJ/SC SOLN *WRAPPED*
15.0000 [IU] | Freq: Three times a day (TID) | SUBCUTANEOUS | Status: DC
Start: 2012-07-29 — End: 2012-07-29

## 2012-07-29 MED ADMIN — Acetaminophen Tab 500 MG: 1000 mg | ORAL | NDC 10135015210

## 2012-07-29 MED ADMIN — Trazodone HCl Tab 50 MG: 300 mg | ORAL | NDC 50111043303

## 2012-07-29 MED ADMIN — Gabapentin Cap 300 MG: 300 mg | ORAL | NDC 53746010205

## 2012-07-29 NOTE — ED Provider Notes (Signed)
History     Chief Complaint   Patient presents with   . Chest Pain     pt c/o chest pain and sob. sx began 4 days ago. cardiac hx, copd. report per ems     HPI Comments: Flushed SOB recurrent since yesterday with chills but  no documented fever. CP lasts minutes and is recurrent. She has no tightness now with oxygen . She has history cardiac stents and COPD /asthma. No sputum.     Patient is a 57 y.o. female presenting with chest pain.   History provided by:  Patient  Language interpreter used: No    Chest Pain  Pain location:  L chest and L lateral chest  Pain quality: aching and pressure    Pain radiates to:  Does not radiate  Pain radiates to the back: no    Pain severity:  No pain  Onset quality:  Gradual  Timing:  Intermittent  Progression:  Worsening  Chronicity:  Recurrent  Context: breathing, lifting, movement, raising an arm, at rest and stress    Context: no drug use, not eating and no trauma    Relieved by:  Certain positions and oxygen  Worsened by:  Exertion  Ineffective treatments:  Certain positions and rest  Associated symptoms: anorexia, cough, fatigue, fever, nausea, shortness of breath and weakness    Associated symptoms: no abdominal pain, no AICD problem, no altered mental status, no anxiety, no back pain, no claudication, no diaphoresis, no dizziness, no dysphagia, no headache, no heartburn, no lower extremity edema, no near-syncope, no numbness, no orthopnea, no palpitations, no PND, no syncope and not vomiting    Risk factors: hypertension and obesity    Risk factors: no aortic disease, no birth control, no coronary artery disease, no diabetes mellitus, no Ehlers-Danlos syndrome, no high cholesterol, no immobilization, no prior DVT/PE, no smoking and no surgery        Past Medical History   Diagnosis Date   . Asthma    . COPD (chronic obstructive pulmonary disease)    . Coronary artery disease    . Diabetes mellitus    . Hypertension    . Ischemic Stroke 03/06/2006     Created by Conversion     . Prior Myocardial Infarction 03/19/2007     Created by Conversion    . Unspecified cerebral artery occlusion with cerebral infarction      2008   . Arthritis             Past Surgical History   Procedure Laterality Date   . Coronary angioplasty with stent placement     . Bunionectomy         Family History   Problem Relation Age of Onset   . Diabetes Other    . Heart disease Other    . Hypertension Other    . Coronary art dis Other    . Hypertension Mother    . Diabetes Mother    . Alcohol abuse Father    . Other Father      cirrhosis   . Arthritis Sister    . Substance abuse Sister    . Other Brother      HIV   . Substance abuse Brother    . Diabetes Brother          Social History      reports that she quit smoking about 22 months ago. Her smoking use included Cigarettes. She smoked 0.00 packs per day. She has  never used smokeless tobacco. She reports that she does not drink alcohol or use illicit drugs. Her sexual activity history is not on file.    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     Employment Retired    Domestic Violence Risk No          Review of Systems   Review of Systems   Constitutional: Positive for fever and fatigue. Negative for chills, diaphoresis, activity change, appetite change and unexpected weight change.   HENT: Negative for ear pain, nosebleeds, congestion, sore throat, trouble swallowing, neck pain, neck stiffness, voice change, postnasal drip and ear discharge.    Eyes: Negative for discharge and visual disturbance.   Respiratory: Positive for cough and shortness of breath. Negative for choking, chest tightness and wheezing.    Cardiovascular: Positive for chest pain. Negative for palpitations, orthopnea, claudication, leg swelling, syncope, PND and near-syncope.   Gastrointestinal: Positive for nausea and anorexia. Negative for  heartburn, vomiting, abdominal pain, constipation and abdominal distention.   Genitourinary: Negative for dysuria, urgency, frequency, vaginal discharge and difficulty urinating.   Musculoskeletal: Negative for back pain and arthralgias.   Skin: Negative.    Neurological: Positive for weakness. Negative for dizziness, syncope, speech difficulty, light-headedness, numbness and headaches.   Psychiatric/Behavioral: Negative.  Negative for altered mental status.       Physical Exam     ED Triage Vitals   BP Heart Rate Heart Rate(via Pulse Ox) Resp Temp Temp src SpO2 O2 Device O2 Flow Rate   07/29/12 1103 07/29/12 1103 -- 07/29/12 1103 07/29/12 1103 -- 07/29/12 1103 07/29/12 1103 --   146/98 mmHg 73  18 36.7 C (98.1 F)  94 % None (Room air)       Weight           07/29/12 1103           104.327 kg (230 lb)               Physical Exam   Nursing note and vitals reviewed.  Constitutional: She is oriented to person, place, and time. She appears well-developed and well-nourished. No distress.   HENT:   Head: Normocephalic and atraumatic.   Right Ear: External ear and ear canal normal.   Left Ear: External ear and ear canal normal.   Nose: Nose normal.   Mouth/Throat: Oropharynx is clear and moist. No oropharyngeal exudate.   Eyes: Conjunctivae and EOM are normal. Pupils are equal, round, and reactive to light. Right eye exhibits no discharge. Left eye exhibits no discharge. No scleral icterus.   Neck: Normal range of motion. Neck supple. No tracheal deviation present. No thyromegaly present.   Cardiovascular: Normal rate, regular rhythm, normal heart sounds and intact distal pulses.    No murmur heard.  Pulmonary/Chest: Effort normal. No stridor. No respiratory distress. She has wheezes. She has no rales.   Abdominal: Soft. Bowel sounds are normal. She exhibits no distension. There is no tenderness.   Musculoskeletal: Normal range of motion. She exhibits no edema.   Lymphadenopathy:     She has no cervical adenopathy.    Neurological: She is alert and oriented to person, place, and time.   Skin: Skin is warm and dry. She is not diaphoretic.   Psychiatric: She has a  normal mood and affect. Her behavior is normal. Judgment and thought content normal.       Medical Decision Making   <EDMDM>    Initial Evaluation:  ED First Provider Contact    Date/Time Event User Comments    07/29/12 1106 ED Provider First Contact [facility], Jacorion Klem R Initial Face to Face Provider Contact          Patient seen by me as above    Assessment:  57 y.o., female comes to the ED with chest pain SOB wheezing    Differential Diagnosis includes COPD exacerbation v ACS              Plan: labs imaging      Jasper Hanf Madilyn Hook, MD    Labs OK Persistent wheezing For nebs steroids and rule out          Almetta Lovely, MD  07/29/12 1308

## 2012-07-29 NOTE — H&P (Addendum)
Subjective:     CC: Chest pain and shortness of breath in a 57 year old with poorly controlled COPD, DMII and HTN with CAD s/p cardiac stenting.  HPI:  Short of breath for 5 days with a painful chest pressure that last 5 minutes at a time and "feels like someone stepped on me". Pt was concerned about the chest pain and felt like she was going to panic because she didn't know what to do.  Patient notes she does not like to use neb treatments because she feels claustrophobic.  Pt does not know what meds she takes. HCR helps pt with meds. Aid comes daily. Nurse weekly, but has been coming daily more recently.  During patient's last admission her sugars were controlled on 30 of short acting insulin TID AC and 90 of Lantis while on hospital food.     Social hx: pt was in [facility] a time. (this came up because she saw an old frined from the [facility] in the ED by chance).      Patient Active Problem List    Diagnosis Date Noted   . Hypertension 03/19/2007     Priority: High            . Type II, uncontrolled 03/19/2007     Priority: High     Ophthalmologist:  Podiatrist:  [facility] [phone]  Lab Results   Component Value Date    HA1C 8.3* 05/29/2011    HA1C 10.6* 03/27/2011    HA1C 10.5* 11/25/2010    MALBR 1.73 03/27/2011    CREAT 1.00* 12/22/2011    LDLC 111 09/21/2011            . Controlled substance agreement signed 12/08/2011     Priority: Medium     For DJD associated bilateral knee pain.  Therapeutic goal of independent ADL, increased activity and weight loss.  Copy of controlled substance agreement in Media tab for printing.  -      12/01/2011 Morphine 12h tabs 30mg  BID PRN.  60 tabs,  Renew on 01/01/2012.  -    12/28/2011 Morphine 12h tabs 60mg  BID PRN.  40 tabs,  Renew on 01/08/2012.   Rx: morphine (MS CONTIN) 30 MG 12 hr tablet; Take 2 tablets (60 mg total) by mouth 2 times daily as needed for Severe Pain   MDD 120 mg.  10 day supply.     . Aspiration of liquid 09/26/2011     Priority:  Medium     At risk for aspiration, uses Thick-it starch with liquids as thickening agent.     . DJD (degenerative joint disease) of knee, bilateral 07/21/2011     Priority: Medium     Family history of DJD     . Insomnia 05/02/2011     Priority: Medium     Currently and historically using doses 100-300mg  trazodone qhs.  Hx symptoms concerning for long QT on doses >300mg  (self admin, non-adherent to prescribed dosage).     Marland Kitchen Anxiety and depression 11/10/2010     Priority: Medium     PHQ9  PHQ9 05/08/2012   PHQ-9 Total Score 7       PHQ9 11/17/2011   PHQ-9 Total Score 14          . Chronic Obstructive Pulmonary Disease 12/30/2008     Priority: Medium     COPD exacerbations for year: 03/2011; 12/2011, 03/2012  No recent PFTs, likely GOLD 2 or 3.  mMRC likely 3, CAT likely >21.    Former smoker, in remission.  No O2 requirement.  Current Meds:    - budesonide-formoterol (Symbicort) 160-4.5 BID    - albuterol PRN     - Duoneb PRN for exacerbation, call MD if using       . Coronary Artery Disease 03/19/2007     Priority: Medium     Stents placed; on aspirin + plavix    December 2010 Stress Echo  Overall impression:   1. Partially transient inferior wall defect consistent with limited mixed ischemia/infarction in the RCA distribution   2. Preserved left ventricular systolic function by gated SPECT.        . Pain, dental 05/30/2011     Priority: Low   . Bilateral presbyopia 05/30/2011     Priority: Low   . Dental caries 11/10/2010     Priority: Low   . Asthma 03/19/2007     Priority: Low     Created by Conversion       . Chest heaviness 07/29/2012      Past Surgical History   Procedure Laterality Date   . Coronary angioplasty with stent placement     . Bunionectomy          (Not in a hospital admission)  Allergies   Allergen Reactions   . Keppra Swelling   . Lisinopril      Created by Conversion - Hyperkalemia;       History   Substance Use Topics   . Smoking status: Former Smoker     Types: Cigarettes     Quit date:  09/20/2010   . Smokeless tobacco: Never Used   . Alcohol Use: No      Comment: pt denied      Family History   Problem Relation Age of Onset   . Diabetes Other    . Heart disease Other    . Hypertension Other    . Coronary art dis Other    . Hypertension Mother    . Diabetes Mother    . Alcohol abuse Father    . Other Father      cirrhosis   . Arthritis Sister    . Substance abuse Sister    . Other Brother      HIV   . Substance abuse Brother    . Diabetes Brother       Review of Systems (positives pre HPI)  No fevers, no malaise  No sore throat, no sinus congestion  No productive cough.  No abdominal pain, no diarrhea or constipation  No urinary symptoms.    Objective:     Patient Vitals for the past 8 hrs:   BP Temp Pulse Resp SpO2 Height Weight   07/29/12 1103 146/98 mmHg 36.7 C (98.1 F) 73 18 94 % 1.626 m (5' 4.02") 104.327 kg (230 lb)   Gen: obese african [place] woman, mildly anxious coversant but with labored breathing lying in bed watching TV and eating lunch.  HEENT: NC AT MMM, no sinus tenderness, no lymphadenopathy.  Cards: regular, nl s1 s2 no m/r/g  Chest: Significantly decreased air entry with extensive expiratory wheezing diffusely.  Abd: Soft, NT, obese, BS +  Ext: skin intact, no edema.      Results for WENDYE, OUTHOUSE (MRN [id number]) as of 07/29/2012 13:50   07/29/2012 11:25   Lactate VEN,WB 1.8   NT-pro BNP <50   Troponin T <0.01   CK 216 (H)  Mass CKMB 3.6 (H)   Creatinine 1.08 (H) baseline   Glucose 322 (H)      Nl cbc, ABG  Neg lactate.    RADIOLOGY:  * Portable Chest Standard Ap Single View     No evidence of acute disease in the chest.      ECHO in jan 2014: normal.    Assessment:     Principal Problem:    Chest heaviness  Active Problems:    Hypertension    Type II, uncontrolled    Chronic Obstructive Pulmonary Disease    Anxiety and depression    Bilateral presbyopia  Chest pain and shortness of breath in a 57 year old with poorly controlled COPD, DMII and HTN with CAD s/p cardiac  stenting.  Likely COPD exacerbation related but pt is at risk for cardiac etiology. Admitted for rule out MI.    Plan:   ASSESSMENT/PLAN:  1. Chest pain, MI rule out  1. Troponins x3  2. EKG  3. Tele   4. Cont home Asprin and Plavix.  2. COPD vs Asthma exacerbation  1. Changed from nebs to MDIs per pt preference. Respiratory to bring spacer.  1. Combivent 4x daily  2. Albuterol q2 until respiratory exam improves (written as scheduled not PRN because patient is unlikely to request doses and has demonstrated limited insight into her symptom burden.  2. S/p solumedrol in ED, start prednisone 60 mg daily  3. Diabetes, poorly controlled with hyperglycemia on admission. Pt taking in good PO. Anticipate any reduced intake will be of set by impact of steroids.  1. Continue aggressive insulin regeminen (30 units of short acting TID, 90 units of lantis nightly). Titrate as needed.   4. HTN  1. Cont home cozaar and HCTZ ( home combo pill broken up into 2 regular pills)   2. Cont home metoprolol 100mg , titrate as needed.  5. Anxiety and Depression  1. Cont home psyche meds, including 300mg  Trazodone nightly.  6. DVT prophylaxis:Ultrafiltration Heparin  7. Fluids:0.9NS @ 50ml/hr.  Electrolytes: normal.  Nutrition: DM, nectar thick.  8. Code Status:Full code  9. Dispo: home in AM with completion of rule out protocol.    Active Problems:    Chest heaviness    Author: [name], MD  Note created: 07/29/2012  at: 1:51 PM     I saw and evaluated the patient. I agree with the resident's/fellow's findings and plan of care as documented above.    Kyleigha Markert J Marisue Canion, DO

## 2012-07-29 NOTE — ED Notes (Signed)
Patient confirmed with Mardella Layman on MOU. This patient will be arriving to the floor shortly. Please call Jim Like [phone] with questions regarding this patient.

## 2012-07-29 NOTE — Telephone Encounter (Signed)
[facility] Family Medicine: After Hours / On-Call Note    Date of Call: 07/29/2012   Time of Call: 9:50am    Message: SOB, panic    Impression:   34 yof with h/o COPD, DM2, CAD s/p stent on ASA and Plavix, calling with 4 days of persistent SOB and panic feeling. Unable to sleep. Sx worsened with exertion. Lives alone. Checked her SpO2 and found it to be 94% but given duration and unrelenting sx is thinking about calling an ambulance.       Disposition/Plan:  4 yof with sx concerning for ACS vs COPD exacerbation. Ddx also includes arrythmia, anxiety, ?CHF. Agreed with pt and advised to call ambulance and present to [facility] ED. ED made aware.       Farrell Ours, MD on 07/29/2012 at 10:34 AM

## 2012-07-29 NOTE — Progress Notes (Addendum)
Patient doing well when she arrived from ED to [facility].  Biggest  C/o pain is in her knees, which is chronic.  Most morning meds were taken by patient at home before arrival to ED.  Scheduled morphine given along with PRN tylenol.  Patient ordered dinner and knows to call for assistance when needed.  She brought her cane from home along with her knee braces that she uses.  Pt. C/o chest heaviness, but she states it is much better than before.    Will continue to monitor.    UPDATE:  1845:  Patient now c/o more heaviness in her chest on the left side under the breast.  MD paged.  Still awaiting reply.    MD up to see patient.  She is currently reviewing medication regimen.  Will continue to monitor.

## 2012-07-29 NOTE — ED Notes (Signed)
Patient given boxed lunch with provider's approval.

## 2012-07-30 ENCOUNTER — Other Ambulatory Visit: Payer: Self-pay | Admitting: Gastroenterology

## 2012-07-30 ENCOUNTER — Inpatient Hospital Stay
Admit: 2012-07-30 | Disposition: A | Discharge status: Home / Self Care | Payer: Self-pay | Source: Ambulatory Visit | Attending: Family Medicine | Admitting: Family Medicine

## 2012-07-30 LAB — POCT GLUCOSE
Glucose POCT: 188 mg/dL — ABNORMAL HIGH (ref 60–99)
Glucose POCT: 272 mg/dL — ABNORMAL HIGH (ref 60–99)
Glucose POCT: 323 mg/dL — ABNORMAL HIGH (ref 60–99)
Glucose POCT: 331 mg/dL — ABNORMAL HIGH (ref 60–99)
Glucose POCT: 333 mg/dL — ABNORMAL HIGH (ref 60–99)

## 2012-07-30 LAB — TROPONIN T
Troponin T: <0.01 ng/mL (ref 0.00–0.02)
Troponin T: <0.01 ng/mL (ref 0.00–0.02)

## 2012-07-30 LAB — CK ISOENZYMES
CK: 145 U/L (ref 34–145)
Mass CKMB: 2.9 ng/mL (ref 0.0–2.9)

## 2012-07-30 MED ORDER — ALBUTEROL SULFATE HFA 108 (90 BASE) MCG/ACT IN AERS *I*
2.0000 | INHALATION_SPRAY | RESPIRATORY_TRACT | Status: DC | PRN
Start: 2012-07-30 — End: 2012-12-27

## 2012-07-30 MED ORDER — DOXYCYCLINE HYCLATE 100 MG PO TABS *I*
100.0000 mg | ORAL_TABLET | Freq: Two times a day (BID) | ORAL | Status: DC
Start: 2012-07-30 — End: 2012-08-03
  Administered 2012-07-30 – 2012-08-03 (×9): 100 mg via ORAL
  Filled 2012-07-30 (×9): qty 1

## 2012-07-30 MED ORDER — ENOXAPARIN SODIUM 100 MG/ML IJ SOSY *I*
100.0000 mg | PREFILLED_SYRINGE | Freq: Two times a day (BID) | INTRAMUSCULAR | Status: DC
Start: 2012-07-30 — End: 2012-08-03
  Administered 2012-07-30 – 2012-08-03 (×8): 100 mg via SUBCUTANEOUS
  Filled 2012-07-30 (×8): qty 1

## 2012-07-30 MED ORDER — IPRATROPIUM-ALBUTEROL 18-103 MCG/ACT IN AERO *I*
1.0000 | INHALATION_SPRAY | RESPIRATORY_TRACT | Status: DC | PRN
Start: 2012-07-30 — End: 2013-09-18

## 2012-07-30 MED ORDER — ALBUTEROL SULFATE (2.5 MG/3ML) 0.083% IN NEBU *I*
2.5000 mg | INHALATION_SOLUTION | RESPIRATORY_TRACT | Status: DC | PRN
Start: 2012-07-30 — End: 2012-08-04
  Administered 2012-07-30 – 2012-08-02 (×12): 2.5 mg via RESPIRATORY_TRACT
  Filled 2012-07-30 (×13): qty 3

## 2012-07-30 MED ORDER — DIPHENHYDRAMINE-LIDOCAINE-MAALOX (BMX/FIRST MOUTHWASH) *WRAPPED*
30.0000 mL | ORAL | Status: DC | PRN
Start: 2012-07-30 — End: 2012-08-05
  Administered 2012-07-30: 30 mL via OROMUCOSAL
  Filled 2012-07-30 (×2): qty 30

## 2012-07-30 MED ORDER — POLYETHYLENE GLYCOL 3350 PO PACK 17 GM *I*
17.0000 g | PACK | Freq: Every day | ORAL | Status: DC
Start: 2012-07-31 — End: 2012-08-05
  Administered 2012-07-31 – 2012-08-04 (×5): 17 g via ORAL
  Filled 2012-07-30 (×6): qty 17

## 2012-07-30 MED ORDER — ENOXAPARIN SODIUM 100 MG/ML IJ SOSY *I*
1.0000 mg/kg | PREFILLED_SYRINGE | INTRAMUSCULAR | Status: DC
Start: 2012-07-30 — End: 2012-07-30

## 2012-07-30 MED ORDER — PREDNISONE 20 MG PO TABS *I*
40.0000 mg | ORAL_TABLET | Freq: Every day | ORAL | Status: DC | PRN
Start: 2012-07-30 — End: 2012-08-05

## 2012-07-30 MED ORDER — DOCUSATE SODIUM 100 MG PO CAPS *I*
100.0000 mg | ORAL_CAPSULE | Freq: Two times a day (BID) | ORAL | Status: DC
Start: 2012-07-31 — End: 2012-08-05
  Administered 2012-07-31 – 2012-08-05 (×12): 100 mg via ORAL
  Filled 2012-07-30 (×12): qty 1

## 2012-07-30 MED ORDER — ALBUTEROL SULFATE (2.5 MG/3ML) 0.083% IN NEBU *I*
INHALATION_SOLUTION | RESPIRATORY_TRACT | Status: AC
Start: 2012-07-30 — End: 2012-07-30
  Filled 2012-07-30: qty 3

## 2012-07-30 MED ORDER — IPRATROPIUM-ALBUTEROL 0.5-2.5 MG/3ML IN SOLN *I*
3.0000 mL | RESPIRATORY_TRACT | Status: DC
Start: 2012-07-30 — End: 2012-08-05
  Administered 2012-07-30 (×2): 3 mL via RESPIRATORY_TRACT
  Administered 2012-07-30: 0 mL via RESPIRATORY_TRACT
  Administered 2012-07-30 – 2012-08-05 (×34): 3 mL via RESPIRATORY_TRACT
  Filled 2012-07-30 (×37): qty 3

## 2012-07-30 MED ADMIN — Trazodone HCl Tab 50 MG: 300 mg | ORAL | NDC 50111043303

## 2012-07-30 MED ADMIN — Gabapentin Cap 300 MG: 300 mg | ORAL | NDC 53746010205

## 2012-07-30 MED ADMIN — Morphine Sulfate Tab ER 30 MG: 60 mg | ORAL | NDC 00406838023

## 2012-07-30 MED ADMIN — Acetaminophen Tab 500 MG: 1000 mg | ORAL | NDC 50580050130

## 2012-07-30 MED ADMIN — Albuterol Sulfate Soln Nebu 0.083% (2.5 MG/3ML): 2.5 mg | NDC 00487950101

## 2012-07-30 MED ADMIN — Amlodipine Besylate Tab 5 MG (Base Equivalent): 10 mg | ORAL | NDC 67877019810

## 2012-07-30 NOTE — Plan of Care (Signed)
Problem: Safety  Goal: Patient will remain free of falls  Outcome: Maintaining  Call bell within reach, pt calls appropriately.      Problem: Pain/Comfort  Goal: Patient's pain or discomfort is manageable  Outcome: Maintaining  Pt denied pain this shift.    Problem: Mobility  Goal: Patient's functional status is maintained or improved  Outcome: Progressing towards goal  Pt ambulates independently, she has been encouraged to walk but currently has not taken a walk out of her room. Will continue to encourage her to ambulate.

## 2012-07-30 NOTE — Progress Notes (Signed)
Home Health Assessment    Completed by: Leda Gauze, RN  Phone: [phone]    Referred by: Rosemary Holms      Source of Information: medical record      Home Health indicators present: May be discharged with complex medication or feeding regimen      Plan: Home care referral started; agency chosen [facility]      Comments: Chart reviewed, referral initiated.  Pt currently open to [facility] for services - will continue to monitor hospital course - please call with questions/concerns.    Leda Gauze, RN  [phone]    After hours please call [phone]

## 2012-07-30 NOTE — Progress Notes (Signed)
At 0705 pt c/o a pain that rose from belly to left side of chest that she said occurred around 4 am.  Nurse Bretta Bang told pt that she should inform staff in the even that she feels chest pain in the future.  This writer informed Arville Go, MD at [phone] of this, noted in page to call back and ask for Cordelia Pen, [place] who is also aware of this occurrence.    Sunny Schlein, RN

## 2012-07-30 NOTE — Progress Notes (Signed)
Pt c/o chest tightness end of last shift, provider aware, and came to eval pt. (see provider note) ekg ordered and was reviewed by provider. Labs drawn as ordered, pt did have some lessening of pain w/ nebulizer tx (she is very wheezy and sob w/ exertion)

## 2012-07-30 NOTE — Significant Event (Signed)
Called to see patient for chest pain.    S: patient states that she is having crampy right sided chest pain. She reports that it feels similar to what a muscle cramp in her leg would feel like. She states that she has had heart burn in the past and this feels "something like that, but its not heart burn!" She also reports that the pain is very intermittent, and maybe will last 5 minutes, and states the pain resolved after entering the room. After conversing for a few minutes she states the pain is starting up again. She does also report pain when she takes a deep breath. Denies any radiation of pain.    O:   Blood pressure 150/98, pulse 88, temperature 36.8 C (98.2 F), temperature source Temporal, resp. rate 20, height 1.626 m (5' 4.02"), weight 104.327 kg (230 lb), SpO2 96.00%.  Gen: alert, awake. Sitting upright in bed. NAD. nondiaphoretic  CV: s1, s2 positive. No m/r/g.  Chest: point tenderness right chest wall, but not along where patient reports having pain.  Lungs: mild to moderate air exchange. Prolonged expiratory phase. Diffuse inspiratory and expiratory crackles.  Abd: + BS, obese, soft, nontender.    A/P:  57 year old female with COPD, GERD, DMII, with CAD s/p cardiac stenting with recurrent chest pain. Most likely at this time related to COPD exacerbation given symptoms of pleuritic chest pain, although will obtain EKG, troponin, ck isoenzymes to rule out ACS. Will also try maalox, for the pain being similar to GERD.     EKG: baseline abnormality in lead v2. Otherwise unremarkable. No ST changes. No Q waves. Similar to last EKG    - duoneb treatment for COPD symptoms  - will continue to monitor     Jeanice Lim, MD  Family Medicine PGY-1  07/30/2012  8:03 PM

## 2012-07-30 NOTE — Progress Notes (Addendum)
Subjective:     CC: Chest pain and shortness of breath in a 57 year old with poorly controlled COPD, DMII and HTN with CAD s/p cardiac stenting.    Interval Hx    Pt's chest pain a has reduced and resolved. She did have a severe upper left abominal cramping pain that moved up under her left breast at 4 am overnight lasting 30 seconds.  Breathing is still quite poor. Pt SOB despite frequent albuterol and combavent MDI doses.  Pt did not get her prednisone until this am.     Objective:     Patient Vitals for the past 8 hrs:   BP Temp Temp src Pulse Resp SpO2   07/30/12 0810 124/80 mmHg - - 84 - -   07/30/12 0624 136/78 mmHg 36.5 C (97.7 F) TEMPORAL 72 18 97 %   07/30/12 0202 102/64 mmHg 36.2 C (97.2 F) TEMPORAL 66 18 94 %   Gen: obese african [place] woman, mildly anxious coversant but with heavy breathing lying in bed watching TV.  HEENT: NC AT MMM, no sinus tenderness, no lymphadenopathy.  Cards: regular, nl s1 s2 no m/r/g  Chest: Significantly decreased air entry with extensive expiratory wheezing diffusely, slightly improved air entry compaired with prior day's exam. Reproducible chest wall pain.  Abd: Soft, NT, obese, BS +  Ext: skin intact, no edema.      Results for AMARIANA, Ray (MRN [id number]) as of 07/29/2012 13:50  Troponin T <0.01 x3      Tele reviewed, no concerning rhythems. Notable for desaturations to 88% O2 sat at rest on room air.    ECHO in jan 2014: normal.    Assessment:     Principal Problem:    Chest heaviness  Active Problems:    Hypertension    Type II, uncontrolled    Chronic Obstructive Pulmonary Disease    Anxiety and depression    Bilateral presbyopia  moderate to severe COPD exacerbation causing intermittent hypoxia and chest wall pain 57 year old with poorly controlled COPD, DMII and HTN with CAD s/p cardiac stenting.      Plan:     ASSESSMENT/PLAN:    1. Chest pain, MI ruled out  2. COPD exacerbation with intermittent hypoxia. Stable on room air but requiring too frequent  respiratory treatments and too concerning a lung exam to be discharged today. Possibly tomorrow.  1. Albuterol nebs q2prn and duonebs q4 schedauled through neb tube to avoid claustrophobic sensation as suggested by respiratory.  2. S/p solumedrol in ED,  Now prednisone 60 mg daily  3. Add Doxycyline 5 day course for COPD exacerbation management. This has been shown to decrease re-admission rates.  4. Ambulate as able.  3. Diabetes, poorly controlled. Pt taking in good PO. Anticipate any reduced intake will be of set by impact of steroids.  1. Continue aggressive home insulin regeminen (30 units of short acting TID, 90 units of lantis nightly). Titrate as needed.     4. HTN, well controlled.  1. Cont home cozaar and HCTZ ( home combo pill broken up into 2 regular pills)   2. Cont home metoprolol 100mg .    5. Anxiety and Depression  1. Cont home psyche meds, including 300mg  Trazodone nightly.    6. DVT prophylaxis:Lovenox.  7. Fluids:none  Electrolytes: normal.  Nutrition: DM, nectar thick.  8. Code Status:Full code  9. Dispo: pending improvement in respiratory status.    Principal Problem:    Chest heaviness  Active Problems:  Hypertension    Type II, uncontrolled    Chronic Obstructive Pulmonary Disease    Anxiety and depression    Bilateral presbyopia    Author: [name], MD  Note created: 07/30/2012  at: 9:36 AM       I saw and evaluated the patient. I agree with the resident's/fellow's findings and plan of care as documented above.    Gloria Lafavor J Jonne Rote, DO

## 2012-07-30 NOTE — Progress Notes (Signed)
Utilization Management    Level of Care Observation service as of the date 07/29/12      Mikaella Escalona E Bexleigh Theriault, RN     Pager: [phone]

## 2012-07-30 NOTE — Interdisciplinary Rounds (Signed)
Interdisciplinary Rounds Note    Date: 07/30/2012   Time: 3:20 PM   Attendance:  Care Coordinator, Physical Therapist, Registered Nurse and Social Worker    Admit Date/Time:  07/29/2012 10:59 AM    Principal Problem: Chest heaviness  Problem List:   Patient Active Problem List    Diagnosis Date Noted   . Chest heaviness 07/29/2012     Priority: High   . Hypertension 03/19/2007     Priority: High            . Type II, uncontrolled 03/19/2007     Priority: High     Ophthalmologist:  Podiatrist:  [facility] [phone]  Lab Results   Component Value Date    HA1C 8.3* 05/29/2011    HA1C 10.6* 03/27/2011    HA1C 10.5* 11/25/2010    MALBR 1.73 03/27/2011    CREAT 1.00* 12/22/2011    LDLC 111 09/21/2011            . Controlled substance agreement signed 12/08/2011     Priority: Medium     For DJD associated bilateral knee pain.  Therapeutic goal of independent ADL, increased activity and weight loss.  Copy of controlled substance agreement in Media tab for printing.  -      12/01/2011 Morphine 12h tabs 30mg  BID PRN.  60 tabs,  Renew on 01/01/2012.  -    12/28/2011 Morphine 12h tabs 60mg  BID PRN.  40 tabs,  Renew on 01/08/2012.   Rx: morphine (MS CONTIN) 30 MG 12 hr tablet; Take 2 tablets (60 mg total) by mouth 2 times daily as needed for Severe Pain   MDD 120 mg.  10 day supply.     Marland Kitchen DJD (degenerative joint disease) of knee, bilateral 07/21/2011     Priority: Medium     Family history of DJD     . Insomnia 05/02/2011     Priority: Medium     Currently and historically using doses 100-300mg  trazodone qhs.  Hx symptoms concerning for long QT on doses >300mg  (self admin, non-adherent to prescribed dosage).     Marland Kitchen Anxiety and depression 11/10/2010     Priority: Medium     PHQ9  PHQ9 05/08/2012   PHQ-9 Total Score 7       PHQ9 11/17/2011   PHQ-9 Total Score 14          . Chronic Obstructive Pulmonary Disease 12/30/2008     Priority: Medium     COPD exacerbations for year: 03/2011; 12/2011, 03/2012  No recent PFTs,  likely GOLD 2 or 3.    mMRC likely 3, CAT likely >21.    Former smoker, in remission.  No O2 requirement.  Current Meds:    - budesonide-formoterol (Symbicort) 160-4.5 BID    - albuterol PRN     - Duoneb PRN for exacerbation, call MD if using       . Coronary Artery Disease 03/19/2007     Priority: Medium     Stents placed; on aspirin + plavix    December 2010 Stress Echo  Overall impression:   1. Partially transient inferior wall defect consistent with limited mixed ischemia/infarction in the RCA distribution   2. Preserved left ventricular systolic function by gated SPECT.        . Pain, dental 05/30/2011     Priority: Low   . Bilateral presbyopia 05/30/2011     Priority: Low   . Dental caries 11/10/2010     Priority:  Low   . Asthma 03/19/2007     Priority: Low     Created by Conversion           The patient's problem list and interdisciplinary care plan was reviewed.    Discharge Planning                 *Does patient currently have home care services?: No     *Current External Services: None                      Plan    Anticipated Discharge Date:     Discharge Disposition: Home with Services, referred to [facility]

## 2012-07-30 NOTE — Progress Notes (Signed)
Utilization Management    Level of Care Inpatient as of the date 07/30/2012      Eleri Ruben P Karra Pink, RN     Pager: [phone]

## 2012-07-31 LAB — BASIC METABOLIC PANEL
Anion Gap: 13 (ref 7–16)
CO2: 24 mmol/L (ref 20–28)
Calcium: 9.1 mg/dL (ref 8.6–10.2)
Chloride: 98 mmol/L (ref 96–108)
Creatinine: 1.22 mg/dL — ABNORMAL HIGH (ref 0.51–0.95)
GFR,Black: 57 * — AB
GFR,Caucasian: 50 * — AB
Glucose: 289 mg/dL — ABNORMAL HIGH (ref 60–99)
Lab: 22 mg/dL — ABNORMAL HIGH (ref 6–20)
Potassium: 3.9 mmol/L (ref 3.3–5.1)
Sodium: 135 mmol/L (ref 133–145)

## 2012-07-31 LAB — POCT GLUCOSE
Glucose POCT: 296 mg/dL — ABNORMAL HIGH (ref 60–99)
Glucose POCT: 307 mg/dL — ABNORMAL HIGH (ref 60–99)
Glucose POCT: 209 mg/dL — ABNORMAL HIGH (ref 60–99)
Glucose POCT: 194 mg/dL — ABNORMAL HIGH (ref 60–99)
Glucose POCT: 242 mg/dL — ABNORMAL HIGH (ref 60–99)

## 2012-07-31 MED ORDER — INSULIN LISPRO (HUMAN) 100 UNIT/ML IJ/SC SOLN *WRAPPED*
0.0000 [IU] | Freq: Three times a day (TID) | SUBCUTANEOUS | Status: DC
Start: 2012-07-31 — End: 2012-07-31
  Administered 2012-07-31: 6 [IU] via SUBCUTANEOUS

## 2012-07-31 MED ORDER — INSULIN GLARGINE 100 UNIT/ML SC SOLN *WRAPPED*
100.0000 [IU] | Freq: Every evening | SUBCUTANEOUS | Status: DC
Start: 2012-07-31 — End: 2012-08-01
  Administered 2012-07-31: 100 [IU] via SUBCUTANEOUS

## 2012-07-31 MED ORDER — GLUCAGON HCL (RDNA) 1 MG IJ SOLR *WRAPPED*
1.0000 mg | INTRAMUSCULAR | Status: DC | PRN
Start: 2012-07-31 — End: 2012-08-05

## 2012-07-31 MED ORDER — ACETAMINOPHEN 500 MG PO TABS *I*
1000.0000 mg | ORAL_TABLET | Freq: Three times a day (TID) | ORAL | Status: DC
Start: 2012-07-31 — End: 2012-08-05
  Administered 2012-07-31 – 2012-08-05 (×15): 1000 mg via ORAL
  Filled 2012-07-31 (×16): qty 2

## 2012-07-31 MED ORDER — INSULIN LISPRO (HUMAN) 100 UNIT/ML IJ/SC SOLN *WRAPPED*
0.0000 [IU] | Freq: Three times a day (TID) | SUBCUTANEOUS | Status: DC
Start: 2012-07-31 — End: 2012-08-05
  Administered 2012-07-31: 36 [IU] via SUBCUTANEOUS
  Administered 2012-08-01: 38 [IU] via SUBCUTANEOUS
  Administered 2012-08-01: 30 [IU] via SUBCUTANEOUS
  Administered 2012-08-01: 40 [IU] via SUBCUTANEOUS
  Administered 2012-08-02 – 2012-08-03 (×4): 30 [IU] via SUBCUTANEOUS
  Administered 2012-08-04: 15 [IU] via SUBCUTANEOUS
  Administered 2012-08-05 (×2): 20 [IU] via SUBCUTANEOUS

## 2012-07-31 MED ORDER — ALBUTEROL SULFATE (2.5 MG/3ML) 0.083% IN NEBU *I*
2.5000 mg | INHALATION_SOLUTION | Freq: Four times a day (QID) | RESPIRATORY_TRACT | Status: DC | PRN
Start: 2012-07-31 — End: 2012-08-01

## 2012-07-31 MED ORDER — GLUCOSE 40 % PO GEL *I*
15.0000 g | ORAL | Status: DC | PRN
Start: 2012-07-31 — End: 2012-08-05

## 2012-07-31 MED ORDER — DEXTROSE 50 % IV SOLN *I*
25.0000 g | INTRAVENOUS | Status: DC | PRN
Start: 2012-07-31 — End: 2012-08-05

## 2012-07-31 MED ADMIN — Morphine Sulfate Tab ER 30 MG: 60 mg | ORAL | NDC 00406838023

## 2012-07-31 MED ADMIN — Gabapentin Cap 300 MG: 300 mg | ORAL | NDC 53746010205

## 2012-07-31 MED ADMIN — Acetaminophen Tab 500 MG: 1000 mg | ORAL | NDC 10135015210

## 2012-07-31 MED ADMIN — Trazodone HCl Tab 50 MG: 300 mg | ORAL | NDC 50111043303

## 2012-07-31 MED ADMIN — Amlodipine Besylate Tab 5 MG (Base Equivalent): 10 mg | ORAL | NDC 67877019810

## 2012-07-31 MED ADMIN — Ibuprofen Tab 600 MG: 600 mg | ORAL | NDC 00904791461

## 2012-07-31 NOTE — Progress Notes (Addendum)
Family Medicine Progress Note                                                Significant 24 Hour Events       Changed from nebs to MDI per patient preference   Evaluated overnight for wheezing, IWB    Subjective      Feeling increasing chest tightness, breathing no improved, maybe worse.  Not using spacer with MDI.  Feels hospital bed helps significantly, only has flat bed at home.  Nursing coming to home now 1x per week, also needs help with self care, cooking >1h per day.  Needs walker with wheels and seat.  Denies chest pain, nausea, emesis.     Objective      Physical Exam  Recent vital signs reviewed and notable for:     WNL, stable trends    BP 132/80  Pulse 78  Temp(Src) 36.1 C (97 F) (Temporal)  Resp 24  Ht 1.626 m (5' 4.02")  Wt 104.327 kg (230 lb)  BMI 39.46 kg/m2  SpO2 98%   General Constitutional: Tired appearing, NAD    HEENT: EOM full, MMM  Cardiovascular: NR, RR, S1, S2, no m/g/r   Pulmonary: Poor air movement with tight upper airways, mild accessory muscle use   Musculoskeletal: Moving all extremities; warm and well perfused without edema  Neurologic: Alert, oriented x3   Skin: Warm and dry, no rashes     Recent Lab, Micro, and Imaging Studies   Personally reviewed and notable for:    BGs 296-333    Imaging: Reviewed portable CXR from 5/28, appears essentially unchanged from study 2 days prior.    Assessment and Plan     Pulmonary.      COPD exacerbation.  Worse on MDI without spacer.  Sx started Sunday, ~day 4 of illness.   Consider Duoneb vs MDI with spacer for albuterol-ipratropium   Continue PO prednisone, consider 40mg  daily (currently 60)   Continue PRN albuterol MDI with spacer vs neb   Continue doxicycline    Cardiovascular.  MI ruled out, HTN well controlled nor current issues.     qShift vitals   Continue amlodipine, losartan, HZTZ, metoprolol at home doses   Continue home aspirin + plavix    FEN.  Currently euvolemic    Fluids:  PO ad lib     Electrolytes:  No need  for daily labs     Nutrition:  diabetic diet    GI.  No issues, on opiates      Continue aggressive bowel regimen PEG for constipation,    PPI for GERD    Endocrine.      DM, uncontrolled.  Exacerbated by current prednisone for COPD exacerbation   Continue Lantus 90 Units, consider increasing to 100 Units (50 Units in 2 admins) while inpatient for improved glycemic control   Continue Humalog 30 Units nutritional baseline   SSI High    Neuro/Psych.  Baseline exam:  Alert and oriented    Pain. Currently well controlled.   Continue home morphine PRN   Consider scheduled APAP and ibuprofen   Home gabapentin   Continue home trazodone for insomnia   Home buspirone for anxiety      DVT prophylaxis:  Enoxaparin 40mg  daily SubQ    Code Status:  FULL CODE     Dispo:  1-2 days.  Needs hospital bed and rolling walker for home.  Would benefit from daily home nursing visit s/p hospitalization and home health aid for assistance with meals and personal care >1h per day             [name] MD, PhD  Family Medicine, R2  07/31/2012 7:20 AM      I saw and evaluated the patient. I agree with the resident's/fellow's findings and plan of care as documented above.  Mild worsening of breathing and reported CP noted overnight; continue with pulmonary care to maximize her treatment for exacerbation.  Will reassess later however appears will not be stable for dispo for another 1-2 days.    Ozella Comins J Edin Kon, DO

## 2012-07-31 NOTE — Progress Notes (Signed)
Patient re instructed in use of spacer for MDIs

## 2012-07-31 NOTE — Progress Notes (Addendum)
Provider Denese Killings paged to eval pt, at wheezing and tightness not markedly improving w/ nebs (including prn). A pcxr was done as ordered.   Pt continued to be tight wheezy most of shift, + use of accessory muscles. Discussed w/ covering provider, and also had respiratory therapist look at pt, informed provider of suggestions of RT, no changes made to treatment plan, at this time, other than adding another albuterol bed back to back with another

## 2012-07-31 NOTE — Progress Notes (Signed)
SW contacted Larita Fife, [facility] and L/M informing her that pt is here in the hospital and will need a hospital bed and a Rollator walker upon discharge. SW asked for a returned call. SW also received a call from [place] from [facility] [employer] [phone]. Rhonda informed SW that when pt's discharge summary is written there is information that will have to be included in order for pt to return. A list of required information is below:    -Weight  -Blood pressure  -a statement saying that pt can return to enriched housing  -Carbohydrate Controlled Diet  -diagnosis  -Pt can self administer her medications.    SW will continue to follow for d/c planning.    Rosine Door, LMSW  07/31/2012  10:44 AM

## 2012-07-31 NOTE — Progress Notes (Signed)
HCR -   Demographic information sent to [facility] for rollator and hospital bed.  Vendor will need scripts prior to being able to deliver equipment - MD aware.  Pt gave Clinical research associate phone number to call for Bernette Redbird to be able to get into apartment for bed to be set up.  HCR continuing to monitor hospital course - please call with questions/concerns.  Leda Gauze, RN  [phone]    After hours please call [phone]

## 2012-07-31 NOTE — Progress Notes (Signed)
Patient doing well, breathing much easier, sleeping peacefully when entered room.  Complains of rib pain and headache.  Ordering: spacer for use with MID; d/c PRN APAP as duplicate order for scheduled; d/c order for NC O2 (97%on RA); d/c continuous pulse ox as patient not wearing, alarming.    [name] MD, PhD  Family Medicine, R2  07/31/2012 2:15 PM

## 2012-08-01 LAB — CBC AND DIFFERENTIAL
Baso # K/uL: 0 10*3/uL (ref 0.0–0.1)
Basophil %: 0.1 % (ref 0.1–1.2)
Eos # K/uL: 0 10*3/uL (ref 0.0–0.4)
Eosinophil %: 0 % — ABNORMAL LOW (ref 0.7–5.8)
Hematocrit: 36 % (ref 34–45)
Hemoglobin: 11.2 g/dL (ref 11.2–15.7)
Lymph # K/uL: 1.6 10*3/uL (ref 1.2–3.7)
Lymphocyte %: 17.7 % — ABNORMAL LOW (ref 19.3–51.7)
MCV: 81 fL (ref 79–95)
Mono # K/uL: 0.2 10*3/uL (ref 0.2–0.9)
Monocyte %: 2.3 % — ABNORMAL LOW (ref 4.7–12.5)
Neut # K/uL: 7.4 10*3/uL — ABNORMAL HIGH (ref 1.6–6.1)
Nucl RBC # K/uL: 0 10*3/uL
Nucl RBC %: 0 /100 WBC (ref 0.0–0.2)
Platelets: 297 10*3/uL (ref 160–370)
RBC: 4.5 MIL/uL (ref 3.9–5.2)
RDW: 18 % — ABNORMAL HIGH (ref 11.7–14.4)
Seg Neut %: 79.8 % — ABNORMAL HIGH (ref 34.0–71.1)
WBC: 9.3 10*3/uL (ref 4.0–10.0)

## 2012-08-01 LAB — EKG 12-LEAD
P: 52 degrees
P: 57 degrees
QRS: -5 degrees
QRS: -3 degrees
Rate: 75 {beats}/min
Rate: 85 {beats}/min
Severity: NORMAL
Severity: NORMAL
Severity: NORMAL
Severity: NORMAL
T: 58 degrees
T: 61 degrees

## 2012-08-01 LAB — BASIC METABOLIC PANEL
Anion Gap: 13 (ref 7–16)
CO2: 23 mmol/L (ref 20–28)
Calcium: 9.3 mg/dL (ref 8.6–10.2)
Chloride: 98 mmol/L (ref 96–108)
Creatinine: 1.88 mg/dL — ABNORMAL HIGH (ref 0.51–0.95)
GFR,Black: 34 * — AB
GFR,Caucasian: 30 * — AB
Glucose: 273 mg/dL — ABNORMAL HIGH (ref 60–99)
Lab: 43 mg/dL — ABNORMAL HIGH (ref 6–20)
Potassium: 5.2 mmol/L — ABNORMAL HIGH (ref 3.3–5.1)
Sodium: 134 mmol/L (ref 133–145)

## 2012-08-01 LAB — POCT GLUCOSE
Glucose POCT: 300 mg/dL — ABNORMAL HIGH (ref 60–99)
Glucose POCT: 273 mg/dL — ABNORMAL HIGH (ref 60–99)
Glucose POCT: 237 mg/dL — ABNORMAL HIGH (ref 60–99)
Glucose POCT: 134 mg/dL — ABNORMAL HIGH (ref 60–99)
Glucose POCT: 166 mg/dL — ABNORMAL HIGH (ref 60–99)

## 2012-08-01 LAB — PROCALCITONIN: Procalcitonin: <0.05 ng/mL (ref 0.00–0.09)

## 2012-08-01 MED ORDER — INSULIN GLARGINE 100 UNIT/ML SC SOLN *WRAPPED*
110.0000 [IU] | Freq: Every evening | SUBCUTANEOUS | Status: DC
Start: 2012-08-01 — End: 2012-08-03
  Administered 2012-08-01 – 2012-08-02 (×2): 110 [IU] via SUBCUTANEOUS

## 2012-08-01 MED ORDER — SODIUM CHLORIDE 0.9 % IV SOLN WRAPPED *I*
100.0000 mL/h | Status: DC
Start: 2012-08-01 — End: 2012-08-05
  Administered 2012-08-01 – 2012-08-05 (×6): 100 mL/h via INTRAVENOUS

## 2012-08-01 MED ADMIN — Amlodipine Besylate Tab 5 MG (Base Equivalent): 10 mg | ORAL | NDC 67877019810

## 2012-08-01 MED ADMIN — Trazodone HCl Tab 50 MG: 300 mg | ORAL | NDC 50111043303

## 2012-08-01 MED ADMIN — Morphine Sulfate Tab ER 30 MG: 60 mg | ORAL | NDC 00406838023

## 2012-08-01 MED ADMIN — Gabapentin Cap 300 MG: 300 mg | ORAL | NDC 53746010205

## 2012-08-01 NOTE — Progress Notes (Addendum)
Pt woke very weezy throughout again. Dyspnea with speaking, + use of accessory muscles. Prn albuterol neb given, and then scheduled douneb, pt sounds improved slightly now. Will monitor. Spoke w/ overnight covering provider about pt resp status, she continues very wheezy throughtout w/o significant improvement w/ nebulizer, Highly anxious, feels "like she needs air" 2l nasal cannula placed for comfort. Sob when speaking in sentences. Provider is aware and is going to speak with pts attending. Will monitor

## 2012-08-01 NOTE — Progress Notes (Addendum)
Family Medicine Progress Note                                                Significant 24 Hour Events       Supplental O2 trialed for "patient comfort"    Subjective      Feeling intermittently better and then worse, does not know if she is improving.  Chest and ribs hurt from breathing hard, feels like she may be getting tired.  Nebulizer treatments help, but don't last long.  Breathless after walking to bathroom.  Denies fever or chills.  Denies chest pain, nausea, emesis.     Objective      Physical Exam  Recent vital signs reviewed and notable for:     WNL, stable trends    BP 138/70  Pulse 70  Temp(Src) 36.3 C (97.3 F) (Temporal)  Resp 20  Ht 1.626 m (5' 4.02")  Wt 104.327 kg (230 lb)  BMI 39.46 kg/m2  SpO2 97%   General Constitutional: Tired appearing, mild distress, though sleeping on entering room    HEENT: EOM full, MMM  Cardiovascular: NR, RR, S1, S2, no m/g/r   Pulmonary: Poor air movement with tight upper airways, moderate accessory muscle use   Musculoskeletal: Moving all extremities; warm and well perfused without edema  Neurologic: Alert, oriented x3   Skin: Warm and dry, no rashes     Recent Lab, Micro, and Imaging Studies   Personally reviewed and notable for:  K 5.2  BUN 16 -> 22 -> 43  Cr 1.08 -> 1.22 -> 1.88    BGs 289-307      Assessment and Plan     Pulmonary.      Likely severe COPD vs asthma exacerbation.  Consider concomitant pneumonia.  Sx started Sunday, ~day 5 of illness. Previous area on portable concerning for consolidation.   Continue Duoneb 6x daily   Continue PO prednisone, consider 40mg  daily (currently 60)   Continue PRN albuterol neb   Continue doxicycline   To check CXR PA and lateral   To check CBC with diff    if no improvement tomorrow to consider pulm consult to help with ongoing difficulty with management    to also check Procalcitonin, and viral panel today.    Cardiovascular.  MI ruled out, HTN well controlled nor current issues.     qShift  vitals   Continue amlodipine, losartan, HZTZ, metoprolol at home doses   Continue home aspirin + plavix    FEN.  Currently hypovolemic    Fluids:  Bolus 1 L and start IVF    Electrolytes:  Daily BMP, mildly elevated K    Nutrition:  diabetic diet    Renal.  Acute renal injury, likely pre-renal due to poor PO with poor respiratory status.   Consider FENA   Bolus and mIVF as above   Strict I/O for urine output    GI.  No issues, on opiates      Continue aggressive bowel regimen PEG for constipation,    PPI for GERD    Endocrine.      DM, uncontrolled.  Exacerbated by current prednisone for COPD exacerbation   Continue Lantus 100 Units (50 Units in 2 admins) while inpatient for improved glycemic control   Continue Humalog 30 Units nutritional baseline   SSI High    Neuro/Psych.  Baseline exam:  Alert and oriented    Pain. Currently well controlled.   Continue home morphine PRN   Consider scheduled APAP and ibuprofen   Home gabapentin   Continue home trazodone for insomnia   Home buspirone for anxiety      DVT prophylaxis:  Enoxaparin 40mg  daily SubQ    Code Status:  FULL CODE     Dispo:  1-2 days.  Needs hospital bed and rolling walker for home.  Would benefit from daily home nursing visit s/p hospitalization and home health aid for assistance with meals and personal care >1h per day             [name] MD, PhD  Family Medicine, R2  08/01/2012 10:05 AM      I saw and evaluated the patient. I agree with the resident's/fellow's findings and plan of care as documented above.    Updates/Additions made in italics    Dr. Linus Salmons is taking over the service tomorrow am at 7am; will sign out pertinent information to him regarding her care prior to that time.    Kaidyn Hernandes J Bernadetta Roell, DO

## 2012-08-02 ENCOUNTER — Ambulatory Visit: Payer: Self-pay | Admitting: Rehabilitative and Restorative Service Providers"

## 2012-08-02 LAB — RESPIRATORY VIRAL/BACTERIAL PCR PANEL: Respiratory Viral/Bacterial PCR Panel: 0

## 2012-08-02 LAB — BASIC METABOLIC PANEL
Anion Gap: 10 (ref 7–16)
CO2: 24 mmol/L (ref 20–28)
Calcium: 9 mg/dL (ref 8.6–10.2)
Chloride: 103 mmol/L (ref 96–108)
Creatinine: 1.73 mg/dL — ABNORMAL HIGH (ref 0.51–0.95)
GFR,Black: 38 * — AB
GFR,Caucasian: 33 * — AB
Glucose: 146 mg/dL — ABNORMAL HIGH (ref 60–99)
Lab: 47 mg/dL — ABNORMAL HIGH (ref 6–20)
Potassium: 5 mmol/L (ref 3.3–5.1)
Sodium: 137 mmol/L (ref 133–145)

## 2012-08-02 LAB — POCT GLUCOSE
Glucose POCT: 149 mg/dL — ABNORMAL HIGH (ref 60–99)
Glucose POCT: 90 mg/dL (ref 60–99)
Glucose POCT: 128 mg/dL — ABNORMAL HIGH (ref 60–99)
Glucose POCT: 146 mg/dL — ABNORMAL HIGH (ref 60–99)
Glucose POCT: 109 mg/dL — ABNORMAL HIGH (ref 60–99)

## 2012-08-02 MED ORDER — MORPHINE SULFATE 15 MG PO TABS *I*
15.0000 mg | ORAL_TABLET | Freq: Every day | ORAL | Status: DC
Start: 2012-08-03 — End: 2012-08-05
  Administered 2012-08-03 – 2012-08-05 (×3): 15 mg via ORAL
  Filled 2012-08-02 (×3): qty 1

## 2012-08-02 MED ORDER — BUDESONIDE-FORMOTEROL FUMARATE 160-4.5 MCG/ACT IN AERO *I*
2.0000 | INHALATION_SPRAY | Freq: Two times a day (BID) | RESPIRATORY_TRACT | Status: DC
Start: 2012-08-02 — End: 2012-08-05
  Administered 2012-08-02 – 2012-08-05 (×6): 2 via RESPIRATORY_TRACT
  Filled 2012-08-02 (×4): qty 6

## 2012-08-02 MED ORDER — TIOTROPIUM BROMIDE MONOHYDRATE 18 MCG IN CAPS *I*
18.0000 ug | ORAL_CAPSULE | Freq: Every day | RESPIRATORY_TRACT | Status: DC
Start: 2012-08-02 — End: 2012-08-05
  Administered 2012-08-02 – 2012-08-05 (×4): 18 ug via RESPIRATORY_TRACT
  Filled 2012-08-02 (×4): qty 1

## 2012-08-02 MED ORDER — MAGNESIUM HYDROXIDE 400 MG/5ML PO SUSP *I*
30.0000 mL | Freq: Every evening | ORAL | Status: DC
Start: 2012-08-02 — End: 2012-08-05
  Administered 2012-08-02 – 2012-08-03 (×2): 30 mL via ORAL
  Filled 2012-08-02 (×3): qty 30

## 2012-08-02 MED ADMIN — Gabapentin Cap 300 MG: 300 mg | ORAL | NDC 53746010205

## 2012-08-02 MED ADMIN — Morphine Sulfate Tab ER 30 MG: 60 mg | ORAL | NDC 00406838023

## 2012-08-02 MED ADMIN — Trazodone HCl Tab 50 MG: 300 mg | ORAL | NDC 50111043303

## 2012-08-02 MED ADMIN — Amlodipine Besylate Tab 5 MG (Base Equivalent): 10 mg | ORAL | NDC 67877019810

## 2012-08-02 MED ADMIN — Morphine Sulfate Tab ER 30 MG: 60 mg | ORAL | NDC 00406833023

## 2012-08-02 NOTE — Progress Notes (Signed)
HCR -   Continuing to monitor hospital course - please call with questions/concerns.  Jacobus Colvin, RN  [phone]    After hours please call [phone]  Weekends please call Heidi Sweet at [phone]

## 2012-08-02 NOTE — Progress Notes (Signed)
BG 146 prior to dinner, pt refused to eat the meal at that time, says she would save it and eat it at a later time. Pt is being transferred to another floor, accepting nurse aware that insulin ss was held completely including baseline and pt would need to be rechecked prior to eating tray and given insulin appropriately. Pt aware to tell the nurse when she is going to eat dinner.Simmie Davies, RN

## 2012-08-02 NOTE — Progress Notes (Addendum)
[facility]    Vendor is waiting on scripts - please fax scripts for rollator walker and hospital bed to [facility] once they are written.    Thank you.  Leda Gauze, RN  [phone]    After hours please call [phone]  Weekends please call Otto Herb at [phone]

## 2012-08-02 NOTE — Progress Notes (Addendum)
Family Medicine Progress Note                                                Significant 24 Hour Events       No acute events    Subjective      Feeling some better, SOB mostly when walking to bathroom or with any activity.  Better at rest.  Denies chest pain, nausea, emesis.     Objective      Physical Exam  Recent vital signs reviewed and notable for:     WNL, stable trends    BP 144/88  Pulse 69  Temp(Src) 36.1 C (97 F) (Temporal)  Resp 24  Ht 1.626 m (5' 4.02")  Wt 104.327 kg (230 lb)  BMI 39.46 kg/m2  SpO2 100%   General Constitutional: Tired appearing, mild distress, though sleeping on entering room    HEENT: EOM full, MMM  Cardiovascular: NR, RR, S1, S2, no m/g/r   Pulmonary: Improved air movement with some remaining tightness of upper airways, minimal accessory muscle use   Musculoskeletal: Moving all extremities; warm and well perfused without edema  Neurologic: Alert, oriented x3   Skin: Warm and dry, no rashes     Recent Lab, Micro, and Imaging Studies   Personally reviewed and notable for:  K 5.0  BUN 16 -> 22 -> 43 -> 47  Cr 1.08 -> 1.22 -> 1.88 -> 1.73  Procalcitonin <0.05  Viral panel negative    BGs 146-273    CXR 5/29: Atelectasis, mild CPA blunting    Assessment and Plan     Pulmonary.      Likely severe COPD vs asthma exacerbation.  No evidence of pneumonia.  Procalcitonin   Continue Duoneb 6x daily PRN   Continue PO prednisone, consider 40mg  daily (currently 60)   Continue PRN albuterol neb   Continue doxicycline   Will need Pulmonary f/u, call and see if they would like to consult or just see outpatient    Cardiovascular.  MI ruled out, HTN well controlled nor current issues.     qShift vitals   Continue amlodipine, losartan, HZTZ, metoprolol at home doses   Continue home aspirin + plavix    FEN.  Currently hypovolemic    Fluids:  Continue NS at 100    Electrolytes:  Daily BMP, mildly elevated K    Nutrition:  diabetic diet    Renal.  Acute renal injury, likely pre-renal  due to poor PO with poor respiratory status.   Continue IVF as above   Strict I/O for urine output    GI.  No issues, on opiates      Continue aggressive bowel regimen PEG for constipation,    PPI for GERD    Endocrine.      DM, uncontrolled.  Exacerbated by current prednisone for COPD exacerbation   Continue Lantus 110 Units (55 Units in 2 admins) while inpatient for improved glycemic control   Continue Humalog 30 Units nutritional baseline   SSI High    Neuro/Psych.  Baseline exam:  Alert and oriented    Pain. Currently well controlled.   Continue home morphine PRN   Consider scheduled APAP and ibuprofen   Home gabapentin   Continue home trazodone for insomnia   Home buspirone for anxiety    DVT prophylaxis:  Enoxaparin 40mg  daily SubQ  Code Status:  FULL CODE     Dispo:  1-2 days. Reports now has hospital bed home, delivered.  Would benefit from daily home nursing visit s/p hospitalization and home health aid for assistance with meals and personal care >1h per day             [name] MD, PhD  Family Medicine, R2  08/02/2012 5:38 AM    -----------------------------    FM Attending - Seen/Examined    I saw and evaluated the patient. I agree with the resident's/fellow's findings and plan of care as documented above.    Key Hx: Feels slightly better today for the first time in 5 days but her RN for the past 3 days notes no substantial improvement. Winded walking from bed to bathroom. Also feels tightness in her throat. States this is different from prior asthma/COPD exacerbations.    Key PE: Alert female in NAD.  BP 150/83  Pulse 83  Temp(Src) 36.4 C (97.5 F) (Temporal)  Resp 12  Ht 1.626 m (5' 4.02")  Wt 104.327 kg (230 lb)  BMI 39.46 kg/m2  SpO2 96%  CHEST: poor air movement bilaterally, no crackles, scattered bilat wheezing, + inspiratory stridor which disappears with volitional inspiration    Remainder of exam per Dr. Juliane Poot.    Labs and imaging reviewed.    A/P: 57 yo female with  severe asthma/COPD exacerbation. Sx's and stridor suggest there may also be an upper airway component. Pulmonary consultation requested. Continue steroids, nebs, abx.    Arnoldo Hooker, MD

## 2012-08-02 NOTE — Consults (Signed)
Pulmonary Consult Note      Asked to see patient by: Dr. Elisabeth Pigeon  to aid with management of  Copd exacerbation.    HPI: Gloria Ray is a 57 y.o.female with PMHx as below including copd, admitted on 07/30/2012 with acute exacerbation of copd.    Patient with pmh of COPD likely severe, htn, dm2, cad, multiple admits for copd exacerbations over last 3 years.  She seems to average 1-2 admits yearly with 4-6 additional ER visits.  She presented with a 5 day history of worsening shortness of breath, chest pressure for brief periods.  She seems unsure of inhalers she is taking at exam.  She denied fever, chills, night sweats.  She was initially treated with solumedrol in ED then prednisone 60 mg, now 40 mg.  All other ros negative.    Details of PMH, PSH, FH, SH, ROS, home and hospital-administered medications, and allergies were reviewed and are as documented in e-record and in the admission H&P.     Physical Examination:  VS: BP: (142-150)/(70-90)   Temp:  [35.2 C (95.3 F)-36.5 C (97.7 F)]   Temp src:  [-]   Heart Rate:  [69-83]   Resp:  [12-24]   SpO2:  [94 %-100 %]       Gen:    alert, oriented  ENMT:  Pupils equal and reactive, sclera anicteric, free of conjunctival injection,  poor dentition, no oral lesions, no cervical adenopathy   Resp:  cta x 4, no rales, no wheezes, has audible upper airway exp wheeze, ? vcd   CV:    regular S1S2,   no murmur, JVP to 5 cm, no pedal edema    GI:  NABS, abd. Soft, NT/ND, no palpable organomegaly, no palpable masses   Msk: no cyanosis, no clubbing, no joint tenderness    Skin: no erythema, no ulcerations, no palpable indurations    Neuro:  AA&O x 3, speech intact,  Pupils equal and reactive, no focal motor deficits    Lab:  Comprehensive laboratory and microbiology results have been reviewed in e-record.     Imaging:  cxr with mild hyperinflation, no infiltrate    PFTs: none located    Other relevant diagnostic testing: procalcitonin, bnp normal.  Wbc normal.  Cr  1.78.    Assessment:   Gloria Ray is a 57 y.o.female with  Patient Active Problem List   Diagnosis Code   . Coronary Artery Disease 414.00   . Hypertension 401.9   . Asthma 493.90   . Type II, uncontrolled 250.02   . Chronic Obstructive Pulmonary Disease 496   . Dental caries 521.00   . Anxiety and depression 300.4   . Insomnia 780.52   . Pain, dental 525.9   . Bilateral presbyopia 367.4   . 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 Controlled substance agreement signed V58.69   . Chest heaviness 786.59     Pt admitted with acute exacerbation of copd.  She seems to have these frequently but has been slow to respond to bronchodilators and steroids.  She has significant medication noncompliance at baseline, poorly controlled DM2, hyperlipidemia.  She has never had pft's that I can find.  On exam, lungs are clear with audible upper tracheal wheeze c/w vocal cord disease.  This can exist as inflammatory condition or as compensation for obstructive lung disease.      Recommendations:    Maximize maintenance inhalers, continuing symbicort, add spiriva to meds.  RT to educate on use of inhaled medications.  Would wean steroids over 2 weeks.  Pt needs complete PFT's as outpatient.  Would have ENT eval when able for VCD, speech pathology as outpatient may be able to assist with vocal cord training.  On ppi for GERD which is common cause of vocal cord inflammation.  Pt to follow up in pulmonary clinic.

## 2012-08-03 LAB — POCT GLUCOSE
Glucose POCT: 151 mg/dL — ABNORMAL HIGH (ref 60–99)
Glucose POCT: 107 mg/dL — ABNORMAL HIGH (ref 60–99)
Glucose POCT: 84 mg/dL (ref 60–99)
Glucose POCT: 138 mg/dL — ABNORMAL HIGH (ref 60–99)
Glucose POCT: 134 mg/dL — ABNORMAL HIGH (ref 60–99)

## 2012-08-03 LAB — COMPREHENSIVE METABOLIC PANEL
ALT: 30 U/L (ref 0–35)
AST: 33 U/L (ref 0–35)
Albumin: 3.9 g/dL (ref 3.5–5.2)
Alk Phos: 64 U/L (ref 35–105)
Anion Gap: 11 (ref 7–16)
Bilirubin,Total: 0.1 mg/dL (ref 0.0–1.2)
CO2: 24 mmol/L (ref 20–28)
Calcium: 9 mg/dL (ref 8.6–10.2)
Chloride: 104 mmol/L (ref 96–108)
Creatinine: 1.27 mg/dL — ABNORMAL HIGH (ref 0.51–0.95)
GFR,Black: 55 * — AB
GFR,Caucasian: 47 * — AB
Globulin: 2.7 g/dL (ref 2.7–4.3)
Glucose: 68 mg/dL (ref 60–99)
Lab: 34 mg/dL — ABNORMAL HIGH (ref 6–20)
Potassium: 4.4 mmol/L (ref 3.3–5.1)
Sodium: 139 mmol/L (ref 133–145)
Total Protein: 6.6 g/dL (ref 6.3–7.7)

## 2012-08-03 LAB — BLOOD CULTURE
Bacterial Blood Culture: 0
Bacterial Blood Culture: 0

## 2012-08-03 LAB — MAGNESIUM: Magnesium: 1.7 mEq/L (ref 1.3–2.1)

## 2012-08-03 LAB — HEMATOCRIT: Hematocrit: 35 % (ref 34–45)

## 2012-08-03 LAB — PHOSPHORUS: Phosphorus: 4.2 mg/dL (ref 2.7–4.5)

## 2012-08-03 MED ORDER — ENOXAPARIN SODIUM 40 MG/0.4ML IJ SOSY *I*
40.0000 mg | PREFILLED_SYRINGE | INTRAMUSCULAR | Status: DC
Start: 2012-08-04 — End: 2012-08-05
  Administered 2012-08-04: 40 mg via SUBCUTANEOUS
  Filled 2012-08-03 (×2): qty 0.4

## 2012-08-03 MED ORDER — PANTOPRAZOLE SODIUM 40 MG PO TBEC *I*
80.0000 mg | DELAYED_RELEASE_TABLET | Freq: Every morning | ORAL | Status: DC
Start: 2012-08-04 — End: 2012-08-03

## 2012-08-03 MED ORDER — MORPHINE SULFATE ER 30 MG PO TBCR *I*
60.0000 mg | ORAL_TABLET | Freq: Two times a day (BID) | ORAL | Status: DC | PRN
Start: 2012-08-03 — End: 2012-08-05
  Filled 2012-08-03 (×3): qty 2

## 2012-08-03 MED ORDER — NAPROXEN 250 MG PO TABS *I*
250.0000 mg | ORAL_TABLET | Freq: Two times a day (BID) | ORAL | Status: DC
Start: 2012-08-03 — End: 2012-08-03

## 2012-08-03 MED ORDER — PANTOPRAZOLE SODIUM 40 MG PO TBEC *I*
40.0000 mg | DELAYED_RELEASE_TABLET | Freq: Two times a day (BID) | ORAL | Status: DC
Start: 2012-08-03 — End: 2012-08-05
  Administered 2012-08-03 – 2012-08-05 (×4): 40 mg via ORAL
  Filled 2012-08-03 (×4): qty 1

## 2012-08-03 MED ORDER — INSULIN GLARGINE 100 UNIT/ML SC SOLN *WRAPPED*
100.0000 [IU] | Freq: Every evening | SUBCUTANEOUS | Status: DC
Start: 2012-08-03 — End: 2012-08-05
  Administered 2012-08-03 – 2012-08-04 (×2): 100 [IU] via SUBCUTANEOUS

## 2012-08-03 MED ADMIN — Ibuprofen Tab 600 MG: 600 mg | ORAL | NDC 53746046505

## 2012-08-03 MED ADMIN — Trazodone HCl Tab 50 MG: 300 mg | ORAL | NDC 50111043303

## 2012-08-03 MED ADMIN — Amlodipine Besylate Tab 5 MG (Base Equivalent): 10 mg | ORAL | NDC 67877019810

## 2012-08-03 MED ADMIN — Gabapentin Cap 300 MG: 300 mg | ORAL | NDC 53746010205

## 2012-08-03 MED ADMIN — Morphine Sulfate Tab ER 30 MG: 60 mg | ORAL | NDC 00406833023

## 2012-08-03 NOTE — Progress Notes (Signed)
Called to see patient as nursing had noted some tender hematomas.      S: Patient very anxious and worked but about lower abdomin firm painful bruising. She thought they were blood clots headed "straight for her heart".    O: ~14cmx7cm ecchymosis firm and indurated over right lateral panus. several smaller buisness over abdomin.    A/P: Medications reviewed. Patient was on 100 g BID of enoxaparin instead of ppx dosing (40mg  daily). This is likely the cause of the ecchymosis. Dosing adjusted. Situation explained to the patient. She is very relieved that she is not going to die.  Will check a crit to determine extent of bleeding.  [name], MD

## 2012-08-03 NOTE — Progress Notes (Addendum)
Family Medicine Progress Note                                                Significant 24 Hour Events       Pulmonology Consult    Subjective      Patient reports improvement with sitting in chair and at rest, but same dyspnea with exertion such as walking to bathroom.  Notices worsening when she talks- has to stop mid conversation.  Complains of dysphagia, takes an hour to eat a meal and now having trouble even with mashed potatoes or applesauce.  Denies SOB, CP, palpitations, nausea.     Objective      Physical Exam  Recent vital signs reviewed and notable for:     WNL, stable trends    BP 100/60  Pulse 72  Temp(Src) 36.2 C (97.2 F) (Temporal)  Resp 20  Ht 1.626 m (5' 4.02")  Wt 104.327 kg (230 lb)  BMI 39.46 kg/m2  SpO2 97%   General Constitutional: Tired appearing, sitting in chair, no acute distress   HEENT: MMM, EOM full.    Cardiovascular: RR, S1, S2, no m/g/r  Pulmonary: Lungs are clear to auscultation bilaterally, moving air well.    Musculoskeletal: Moving all extremities; warm and well perfused without edema  Neurologic: Alert, oriented x3   Skin: Warm and dry, no rashes     Recent Lab, Micro, and Imaging Studies   Personally reviewed and notable for:  Cr 1.88 -> 1.73 -> 1.27    BGs 84-151      Assessment and Plan     Pulmonary.      Likely severe COPD exacerbation resolved, now with significant vocal cord disease.    Pulmonology consult appreciated   Continue Symbicort, add Spiriva per recs   Continue Duoneb 6x daily PRN, consider using albuterol first   Continue PO prednisone 40mg , wean over 2 weeks   Continue PRN albuterol neb PRN   Discontinue doxicycline   Will need Pulmonary f/u and PFTs as outpatient    Vocal cord disease   Consider ENT consult if symptomatic as inpatient on Monday vs eval as outpatient    Cardiovascular.  MI ruled out, HTN well controlled nor current issues.     qShift vitals   Continue amlodipine, losartan, HZTZ, metoprolol at home doses   Continue home  aspirin + plavix    FEN.  Currently euvolemic    Fluids:  Continue NS at 100 for poor intake    Electrolytes:  Daily BMP, resolved hyperkalemia    Nutrition:  diabetic diet    Renal.  Acute renal injury, likely pre-renal due to poor PO with poor respiratory status, dysphagia.   Continue IVF as above   Strict I/O for urine output    GI.       Dysphagia   Swallow study, consider imaging of head/neck   Consider Speech consult- has seen in the past for aspiration    GERD   Formulary PPI, increase pantoprazole to 40mg  BID (from 40mg  daily)    On opiates      Continue aggressive bowel regimen    Endocrine.      DM, uncontrolled.  BGs controlled with increased insulin inpatient and decreased intake.  Now becoming relatively hypoglycemic   Decrease Lantus 100 Units (55 Units in 2 admins) while inpatient for improved  glycemic control   Continue Humalog 30 Units nutritional baseline, hold for BG < 120 or poor intake   SSI High    Neuro/Psych.  Baseline exam:  Alert and oriented    Pain. Currently well controlled.   Continue home morphine PRN   Consider scheduled APAP and ibuprofen   Home gabapentin   Continue home trazodone for insomnia   Home buspirone for anxiety    DVT prophylaxis:  Enoxaparin 40mg  daily SubQ    Code Status:  FULL CODE     Dispo:  Will evaluate for discharge Monday 6/2. Reports now has hospital bed home, delivered.  Would benefit from daily home nursing visit s/p hospitalization and home health aid for assistance with meals and personal care >1h per day             [name] MD, PhD  Family Medicine, R2  08/03/2012 6:42 AM    -----------------------------    FM Attending - Seen/Examined    I saw and evaluated the patient. I agree with the resident's/fellow's findings and plan of care as documented above.    Key Hx: Still feels very SOB, trouble with her throat, and fearful of going home while being so out of breath.    Key PE: Alert female in NAD.  BP 120/60  Pulse 80  Temp(Src) 36.7  C (98.1 F) (Temporal)  Resp 20  Ht 1.626 m (5' 4.02")  Wt 104.327 kg (230 lb)  BMI 39.46 kg/m2  SpO2 99%  CHEST: fair air movement bilaterally with rare faint wheezing  Remainder of exam per Dr. Juliane Poot.    Labs and imaging reviewed.    A/P: 57 yo female with severe asthma/COPD exacerbation. Sx's and stridor suggest there may also be an upper airway component. Pulmonary consultation appreciated. Continue steroids, nebs, abx. Pharyngogram to evaluate upper airway - may need ENT consultation as inpatient as well.    Arnoldo Hooker, MD

## 2012-08-03 NOTE — Progress Notes (Signed)
Pt very agitated from bruise that has formed on her right side. Pt denies falling. Shanda Bumps RN notified and aware. Lum Keas, RRT 08/03/2012 4:14 PM

## 2012-08-04 LAB — PROTIME-INR
INR: 1 (ref 1.0–1.2)
Protime: 10.4 s (ref 9.2–12.3)

## 2012-08-04 LAB — POCT GLUCOSE
Glucose POCT: 147 mg/dL — ABNORMAL HIGH (ref 60–99)
Glucose POCT: 111 mg/dL — ABNORMAL HIGH (ref 60–99)
Glucose POCT: 44 mg/dL — ABNORMAL LOW (ref 60–99)
Glucose POCT: 113 mg/dL — ABNORMAL HIGH (ref 60–99)
Glucose POCT: 130 mg/dL — ABNORMAL HIGH (ref 60–99)
Glucose POCT: 218 mg/dL — ABNORMAL HIGH (ref 60–99)

## 2012-08-04 LAB — APTT: aPTT: 30.1 s (ref 25.8–37.9)

## 2012-08-04 MED ORDER — DIAZEPAM 2 MG PO TABS *I*
2.0000 mg | ORAL_TABLET | Freq: Four times a day (QID) | ORAL | Status: DC | PRN
Start: 2012-08-04 — End: 2012-08-05
  Administered 2012-08-04 – 2012-08-05 (×3): 2 mg via ORAL
  Filled 2012-08-04 (×3): qty 1

## 2012-08-04 MED ORDER — LORAZEPAM 0.5 MG PO TABS *I*
0.5000 mg | ORAL_TABLET | Freq: Once | ORAL | Status: AC
Start: 2012-08-04 — End: 2012-08-04
  Administered 2012-08-04: 0.5 mg via ORAL
  Filled 2012-08-04: qty 1

## 2012-08-04 MED ORDER — LORAZEPAM 0.5 MG PO TABS *I*
0.5000 mg | ORAL_TABLET | ORAL | Status: DC | PRN
Start: 2012-08-04 — End: 2012-08-04

## 2012-08-04 MED ORDER — LIDOCAINE HCL 2 % (PF) IJ SOLN *I*
100.0000 mg | Freq: Once | INTRAMUSCULAR | Status: DC
Start: 2012-08-04 — End: 2012-08-04
  Filled 2012-08-04 (×3): qty 5

## 2012-08-04 MED ORDER — ALBUTEROL SULFATE (2.5 MG/3ML) 0.083% IN NEBU *I*
2.5000 mg | INHALATION_SOLUTION | RESPIRATORY_TRACT | Status: DC | PRN
Start: 2012-08-04 — End: 2012-08-05
  Administered 2012-08-04 – 2012-08-05 (×5): 2.5 mg via RESPIRATORY_TRACT
  Filled 2012-08-04 (×4): qty 3

## 2012-08-04 MED ORDER — SERTRALINE HCL 50 MG PO TABS *I*
50.0000 mg | ORAL_TABLET | Freq: Every day | ORAL | Status: DC
Start: 2012-08-04 — End: 2012-08-05
  Administered 2012-08-05: 50 mg via ORAL
  Filled 2012-08-04: qty 1

## 2012-08-04 MED ADMIN — Trazodone HCl Tab 50 MG: 300 mg | ORAL | NDC 50111043303

## 2012-08-04 MED ADMIN — Ibuprofen Tab 600 MG: 600 mg | ORAL | NDC 00904585461

## 2012-08-04 MED ADMIN — Gabapentin Cap 300 MG: 300 mg | ORAL | NDC 53746010205

## 2012-08-04 MED ADMIN — Amlodipine Besylate Tab 5 MG (Base Equivalent): 10 mg | ORAL | NDC 67877019810

## 2012-08-04 MED ADMIN — Morphine Sulfate Tab ER 30 MG: 60 mg | ORAL | NDC 00406833023

## 2012-08-04 NOTE — Progress Notes (Signed)
Clinical Swallowing Evaluation   08/04/12 0950   Referral Reason   Referral Reason Assess safety for P.O. diet   BEDSIDE SWALLOW COMMENTS   History of Dysphagia Comments Pt well known to this service. Nectar thick liquids recommended and consumed at home but pt not always compliant.   ASSESSMENT   Cognition Follows commands;Poor safety awareness   Alertness level Fully alert   Ability to follow directions Simple   Head and neck control Independent   Secretion Management Pink, moist   Reflexive cough Not elicited   Voluntary Cough Adequate   Reflexive swallow Present   Voluntary swallow Present   Respiratory Status RA   Communication Status No deficits identified   Oral Mechanism   Oral Mechanism Tested Not tested   PO TRIAL CONSISTENCIES   P.O. Trials Tested Adequate for all consistencies tested  (Increased WOB with large am'ts of thin liquids.)   Aspiration Precautions/Feeding Guidelines   Risk for Aspiration Mild   Feeding Guidelines Upright 90 degrees;Remain upright at least 30 minutes post meal;No straws;Single sips;Slow rate   Impressions   Severity Mild   Type of Dysphagia Pharyngeal dyphagia   Summary Pt well known to this service from previous admissions. Pt demonstrates mild pharyngeal dysphagia at baseline secondary to COPD. Pt is not always compliant with recommendations but does state that she is thickening her fluids at home and has thickener product. Today she has a large cup of thin liquids with a straw at her bedside. She demonstrates increased WOB after drinking from the straw but no overt s/s of aspiration. Per chart review no evidence of pneumonia. Recommend nectar thickened liquids continue but allow thin water between meals. Straw use strongly discouraged and pt in agreement to use cups. No further f/u or swallow study indicated at this time.   Diet Recommendations   Diet Consistency Recommendation Regular Diet   Liquids Recommendations Nectar thick liquids   Studies/Therapy Recommendations    Therapy Recommendations F/U not indicated   Frequency of Services Follow up not indicated   Objective Studies/ Recommendations  N/A   Time Calculation   Start Time 0931   Stop Time 0945   Time Calculation 14   Studies/Therapy Recommendations   Patient Will Benefit From N/A

## 2012-08-04 NOTE — Plan of Care (Signed)
Problem: Pain/Comfort  Goal: Patient's pain or discomfort is manageable  Outcome: Progressing towards goal  Pt c/o pain at hematoma on her right lower abdomen. Warm packs times two with positive results. On tylenol 1000 mg ATC and a additional 600 mg motrin po times one also with relief of bilateral knee pain. No c/o difficulty swallowing. Taking meds in applesauce or pudding. Reminded to call for assist when getting out of bed. Resting comfortably at present.

## 2012-08-04 NOTE — Progress Notes (Addendum)
Family Medicine Progress Note                                                Significant 24 Hour Events       Hematoma, likely due to excess enoxaparin- dose corrected   Evaluated by Speech    Subjective      Feeling some better, dyspnea with exertion remain, tightness in throat remains.  Was very concerned about bruising, now feels reassured.  Denies SOB, CP, palpitations, nausea.     Objective      Physical Exam  Recent vital signs reviewed and notable for:     WNL, stable trends    BP 120/84  Pulse 70  Temp(Src) 36.3 C (97.3 F) (Temporal)  Resp 16  Ht 1.626 m (5' 4.02")  Wt 104.327 kg (230 lb)  BMI 39.46 kg/m2  SpO2 98%   General Constitutional: Tired appearing, sitting in chair, no acute distress   HEENT: MMM, EOM full.    Cardiovascular: RR, S1, S2, no m/g/r  Pulmonary: Lungs are clear to auscultation bilaterally, moving air well.    Musculoskeletal: Moving all extremities; warm and well perfused without edema  Neurologic: Alert, oriented x3   Skin: Warm and dry, no rashes     Recent Lab, Micro, and Imaging Studies   Personally reviewed and notable for:  No labs today    BGs 68-147 on 5/31-6/1    BG 40 6/1 AM, increased to 111 after crackers, juice    Assessment and Plan     Pulmonary.      Likely severe COPD exacerbation resolved, now with significant vocal cord disease.     COPD exacerbation.  Resolved.   No changes today   Will need Pulmonary f/u and PFTs as outpatient    Vocal cord disease   Consider ENT consult if symptomatic as inpatient on Monday vs eval as outpatient    Cardiovascular.  MI ruled out, HTN well controlled no current issues.     Home meds, no changes today    FEN.  Currently euvolemic    Fluids:  Continue NS at 100 for poor intake    Electrolytes:  BMP 6/2    Nutrition:  diabetic diet    Renal.  Acute renal injury, likely pre-renal due to poor PO with poor respiratory status, dysphagia.  Resolving.   Continue IVF as above   Strict I/O for urine output    GI.        Dysphagia   Appreciate Speech eval and recs   Swallow study ordered for AM, consider imaging of head/neck, will defer to ENT as may be due to vocal cord dysfunction    GERD   Formulary PPI, increase pantoprazole to 40mg  BID (from 40mg  daily)    On opiates      Continue aggressive bowel regimen    Endocrine.      DM, uncontrolled.  BGs now well controlled with increased insulin inpatient and decreased intake.  Now with hypoglycemia.   Continue Lantus 100 Units qhs (50 Units in 2 admins) while inpatient for improved glycemic control   Decrease Humalog 15 Units nutritional baseline (from 30), hold for BG < 120 or poor intake   SSI High    Neuro/Psych.  Baseline exam:  Alert and oriented    Pain. Currently well controlled.   No changes,  home meds   PRN ibuprofen    Anxiety.  May be contributing to vocal cord dysfunction and resistance to discharge planning.  Likely needs improved regimen since stopping SNRI for insomnia.    Consider starting sertraline 50mg  daily with 0.25mg  clonazepam TID PRN    DVT prophylaxis:  Enoxaparin 40mg  BID SubQ (decreased from erroneous 100mg  BID    Code Status:  FULL CODE     Dispo:  Will evaluate for discharge Monday 6/2. Has hospital bed at home.  Would benefit from daily home nursing visit s/p hospitalization and home health aid for assistance with meals and personal care >1h per day             [name] MD, PhD  Family Medicine, R2  08/04/2012 2:01 PM    -----------------------------    FM Attending - Seen/Examined    I saw and evaluated the patient. I agree with the resident's/fellow's findings and plan of care as documented above.    Key Hx: Feels breathing is improving today. Painful mass on right flank at site of hematoma. Aware she received enoxaparin dose that was higher than necessary.    Key PE: Alert female in NAD.  BP 120/84  Pulse 70  Temp(Src) 36.3 C (97.3 F) (Temporal)  Resp 16  Ht 1.626 m (5' 4.02")  Wt 104.327 kg (230 lb)  BMI 39.46 kg/m2  SpO2  98%  CHEST: good bilateral air movement with only scattered wheezing  COR: regular, S1S2, no m/r/g  ABD: soft, multiple areas of ecchymosis and hematoma, largest on right flank and right upper quadrant    Remainder of exam per Dr. Juliane Poot.    Labs and imaging reviewed.    A/P: 57 yo female with severe asthma/COPD exacerbation, now improving on steroids, nebs, and abx. Needs ENT evaluation for upper airway component to her dyspnea - consult to be placed in AM. Uncontrolled DM now with hypoglycemia - reduce insulin dose accordingly. Hematoma - expect spont resolution, now on appropriate dose of enoxaparin, Hct stable.    Arnoldo Hooker, MD

## 2012-08-05 ENCOUNTER — Telehealth: Payer: Self-pay

## 2012-08-05 ENCOUNTER — Encounter: Payer: Self-pay | Admitting: Gastroenterology

## 2012-08-05 DIAGNOSIS — J383 Other diseases of vocal cords: Secondary | ICD-10-CM | POA: Diagnosis present

## 2012-08-05 LAB — POCT GLUCOSE
Glucose POCT: 271 mg/dL — ABNORMAL HIGH (ref 60–99)
Glucose POCT: 136 mg/dL — ABNORMAL HIGH (ref 60–99)
Glucose POCT: 47 mg/dL — ABNORMAL LOW (ref 60–99)
Glucose POCT: 49 mg/dL — ABNORMAL LOW (ref 60–99)
Glucose POCT: 76 mg/dL (ref 60–99)

## 2012-08-05 MED ORDER — INSULIN GLARGINE 100 UNIT/ML SC SOPN *I*
PEN_INJECTOR | SUBCUTANEOUS | Status: DC
Start: 2012-08-05 — End: 2012-12-11

## 2012-08-05 MED ORDER — TIOTROPIUM BROMIDE MONOHYDRATE 18 MCG IN CAPS *I*
18.0000 ug | ORAL_CAPSULE | Freq: Every day | RESPIRATORY_TRACT | Status: AC
Start: 2012-08-05 — End: ?

## 2012-08-05 MED ORDER — PREDNISONE 10 MG PO TABS *I*
20.0000 mg | ORAL_TABLET | Freq: Every day | ORAL | Status: AC
Start: 2012-08-05 — End: 2012-08-09

## 2012-08-05 MED ORDER — GENERIC DME *A*
Status: AC
Start: 2012-08-05 — End: ?

## 2012-08-05 MED ORDER — MORPHINE SULFATE 15 MG PO TABS *I*
15.0000 mg | ORAL_TABLET | Freq: Every day | ORAL | Status: DC
Start: 2012-08-05 — End: 2012-09-02

## 2012-08-05 MED ORDER — SERTRALINE HCL 50 MG PO TABS *I*
50.0000 mg | ORAL_TABLET | Freq: Every day | ORAL | Status: DC
Start: 2012-08-05 — End: 2012-08-12

## 2012-08-05 MED ORDER — PREDNISONE 5 MG PO TABS *I*
10.0000 mg | ORAL_TABLET | Freq: Every day | ORAL | Status: AC
Start: 2012-08-05 — End: 2012-08-09

## 2012-08-05 MED ORDER — DIAZEPAM 2 MG PO TABS *I*
2.0000 mg | ORAL_TABLET | Freq: Four times a day (QID) | ORAL | Status: DC | PRN
Start: 2012-08-05 — End: 2012-08-12

## 2012-08-05 MED ORDER — DOCUSATE SODIUM 100 MG PO CAPS *I*
100.0000 mg | ORAL_CAPSULE | Freq: Every day | ORAL | Status: DC
Start: 2012-08-05 — End: 2013-01-17

## 2012-08-05 MED ORDER — PREDNISONE 20 MG PO TABS *I*
40.0000 mg | ORAL_TABLET | Freq: Every day | ORAL | Status: AC
Start: 2012-08-05 — End: 2012-08-09

## 2012-08-05 MED ORDER — MORPHINE SULFATE ER 30 MG PO TBCR *I*
60.0000 mg | ORAL_TABLET | Freq: Two times a day (BID) | ORAL | Status: DC | PRN
Start: 2012-08-05 — End: 2012-08-30

## 2012-08-05 MED ORDER — POLYETHYLENE GLYCOL 3350 PO PACK 17 GM *I*
17.0000 g | PACK | Freq: Every day | ORAL | Status: DC
Start: 2012-08-05 — End: 2013-08-04

## 2012-08-05 MED ADMIN — Amlodipine Besylate Tab 5 MG (Base Equivalent): 10 mg | ORAL | NDC 67877019810

## 2012-08-05 NOTE — Telephone Encounter (Signed)
[facility] called to inform CCP that pt has been re-opened to [facility], orders to follow.

## 2012-08-05 NOTE — Progress Notes (Addendum)
SW was notified of possible d/c today. [facility] is aware of discharge.     Per previous SW [employer]:    When pt's discharge summary is written there is information that will have to be included in order for pt to return. A list of required information is below:   -Weight   -Blood pressure   -a statement saying that pt can return to enriched housing   -Carbohydrate Controlled Diet   -diagnosis   -Pt can self administer her medications.     Marjie Skiff, LMSW  08/05/2012  9:57 AM    SW called Bernette Redbird at [facility] [place] [phone] to notify that pt is returning today. SW also faxed d/c paperwork to [phone].  Marjie Skiff, LMSW  08/05/2012  12:28 PM

## 2012-08-05 NOTE — Progress Notes (Signed)
Pt stated understanding of AVS. Reviewed medications and scheduling with pt. She states she has someone from home health care help with her medications. States she cannot afford her $7 co-pay- inpatient pharmacy waived co-pay. Pt escorted to cab by staff. Cheryln Manly, RN

## 2012-08-05 NOTE — Progress Notes (Addendum)
Spoke with Beth from [facility] to request a same day visit if discharged today.  Also asked that patient be evaluated for meals on wheels. Otis Brace, RN, MS  Care Coordinator pager  [phone]  08/05/2012  9:55 AM

## 2012-08-05 NOTE — Discharge Instructions (Addendum)
Brief Summary of Your Hospital Course (including key procedures and diagnostic test results):  You were hospitalized for a COPD exacerbation and tightening of your vocal cords that made it difficult to breathe.  You were treated with steroids by mouth and inhaled medications that relaxed your airways.  You were given lorazepam to help you relax and relax your vocal cords.    Your instructions:  - Take all medications as directed. The list was reviewed by your doctor, call with any questions.     Recommended diet: Carbohydrate Controlled Diet     Recommended activity: as tolerated    If you experience any of these symptoms within the first 24 hours after discharge:Uncontrolled pain, Chest pain, Shortness of breath, Fever of 101 F. or greater, Poor urinary output, Vomiting, Nausea or Loss of consciousness  please follow up with your CCP Dr. [name] at phone-number: [phone]    Patient may return to enriched housing.  Diagnosis: Acute COPD exacerbation; Vocal cord disease  Patient may self administer her medications.   Weight at discharge: 244.2 lbs  Blood pressure at discharge: 130/68

## 2012-08-05 NOTE — Progress Notes (Signed)
Family Medicine Progress Note                                                Significant 24 Hour Events       No acute events    Subjective      Feels lorazepam helped breath easier, both in bed and with walking.   Denies SOB, CP, palpitations, nausea.     Objective      Physical Exam  Recent vital signs reviewed and notable for:     WNL, stable trends    BP 130/68  Pulse 83  Temp(Src) 36.4 C (97.5 F) (Temporal)  Resp 20  Ht 1.626 m (5' 4.02")  Wt 104.327 kg (230 lb)  BMI 39.46 kg/m2  SpO2 96%   General Constitutional: Sleepy, sitting in bed, no acute distress   HEENT: MMM, EOM full.    Cardiovascular: RR, S1, S2, no m/g/r  Pulmonary: Lungs are clear to auscultation bilaterally, moving air well.  Less stridor when sleeping though with accessory muscle use.   Musculoskeletal: Moving all extremities; warm and well perfused without edema  Neurologic: Alert, oriented x3   Skin: Warm and dry, no rashes     Recent Lab, Micro, and Imaging Studies   Personally reviewed and notable for:  No labs today    BGs 111-271      Assessment and Plan     Pulmonary.      Likely severe COPD exacerbation resolved, now with significant vocal cord disease.     COPD exacerbation.  Resolved.   No changes today   Will need Pulmonary f/u and PFTs as outpatient    Vocal cord disease    Call for ENT consult today    Cardiovascular.  MI ruled out, HTN well controlled no current issues.     Home meds, no changes today    FEN.  Currently euvolemic    Fluids:  D/c IVF for improved intake    Electrolytes:  Previously normal    Nutrition:  diabetic diet    Renal.  Acute renal injury, likely pre-renal due to poor PO with poor respiratory status, dysphagia.  Resolving.   Strict I/O for urine output    GI.       Dysphagia   Appreciate Speech eval and recs   Swallow study ordered consider imaging of head/neck, will defer to ENT as may be due to vocal cord dysfunction    GERD   Formulary PPI, increase pantoprazole to 40mg  BID (from  40mg  daily)    On opiates      Continue aggressive bowel regimen    Endocrine.      DM, uncontrolled.  BGs now well controlled with increased insulin inpatient and decreased intake.  Improved with insulin changes.   Continue Lantus 100 Units qhs (50 Units in 2 admins) while inpatient for improved glycemic control   Humalog 20 Units nutritional baseline (at home, 30), hold for BG < 120 or poor intake   SSI High    Neuro/Psych.  Baseline exam:  Alert and oriented    Pain. Currently well controlled.   No changes, home meds   PRN ibuprofen    Anxiety.  May be contributing to vocal cord dysfunction and resistance to discharge planning.  Likely needs improved regimen since stopping SNRI for insomnia.   Starting sertraline 50mg  daily  D/c diazepam    DVT prophylaxis:  Enoxaparin 40mg  BID SubQ     Code Status:  FULL CODE     Dispo:  Will evaluate for discharge today, Monday 6/2. Has hospital bed at home.  Would benefit from daily home nursing visit, would benefit from daily home health aid on discharge and continued med assistance as well.  Consider PT/OT eval for appropriateness for higher level of care.            [name] MD, PhD  Family Medicine, R2  08/05/2012 8:19 AM

## 2012-08-05 NOTE — Discharge Summary (Addendum)
Name: Gloria Ray MRN: [id number] DOB: January 26, 1956     Admit Date: 07/30/2012   Date of Discharge: 08/05/2012    Discharge Attending Physician: Reggy Eye, Latrenda Irani      Hospitalization Summary    CONCISE NARRATIVE: Patient admitted and treated for acute COPD exacerbation, slow to respond to treatment with significant vocal cord disease.  Treated with steroids, bronchodilators, doxicycline, and benzodiazepines for anxiety exacerbating vocal cord disease.  Discharged with 16 day steroid taper.  Started on sertraline for anxiety.  Insulins were increased with improved glycemic control.  Enoxaparin was overdosed during the admission resulting in a hematoma, resolving at time of discharge.         CONSULTANT SERVICE   [place] [place] [facility]               Signed: [name], MD  On: 08/05/2012  at: 12:12 PM    -----------------------------    FM Attending - Seen/Examined    I saw and evaluated the patient. I agree with the resident's/fellow's findings and plan of care as documented above.    Key Hx: Breathing remains improved.    Key PE: Alert female in NAD.  BP 126/70  Pulse 78  Temp(Src) 36.2 C (97.2 F) (Temporal)  Resp 20  Ht 1.626 m (5' 4.02")  Wt 110.768 kg (244 lb 3.2 oz)  BMI 41.9 kg/m2  SpO2 95%    Remainder of exam per Dr. Juliane Poot.    Labs and imaging reviewed.    A/P: 57 yo female with severe asthma/COPD exacerbation, ongoing improviement on steroids, nebs, and abx. Medically stable for discharge home. Needs ENT evaluation for upper airway component to her dyspnea - consult to be obtained as outpatient. Uncontrolled DM with some hypoglycemia - reduced insulin dose accordingly. Hematoma due to enoxaparin - expect spont resolution, Hct stable.    Gloria Hooker, MD

## 2012-08-05 NOTE — Progress Notes (Addendum)
SW set up transport via medicaid/[facility] [employer] [phone] for 2:00pm.   Marjie Skiff, LMSW  08/05/2012  12:32 PM

## 2012-08-05 NOTE — Interdisciplinary Rounds (Signed)
Interdisciplinary Rounds Note    Date: 08/05/2012   Time: 3:40 PM   Attendance:  Care Coordinator and Social Worker    Admit Date/Time:  07/30/2012  3:13 PM    Principal Problem: Chest heaviness  Problem List:   Patient Active Problem List    Diagnosis Date Noted   . Chest heaviness 07/29/2012     Priority: High   . Hypertension 03/19/2007     Priority: High            . 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 Controlled substance agreement signed 12/08/2011     Priority: Medium     For DJD associated bilateral knee pain.  Therapeutic goal of independent ADL, increased activity and weight loss.  Copy of controlled substance agreement in Media tab for printing.  -      12/01/2011 Morphine 12h tabs 30mg  BID PRN.  60 tabs,  Renew on 01/01/2012.  -    12/28/2011 Morphine 12h tabs 60mg  BID PRN.  40 tabs,  Renew on 01/08/2012.   Rx: morphine (MS CONTIN) 30 MG 12 hr tablet; Take 2 tablets (60 mg total) by mouth 2 times daily as needed for Severe Pain   MDD 120 mg.  10 day supply.     Marland Kitchen DJD (degenerative joint disease) of knee, bilateral 07/21/2011     Priority: Medium     Family history of DJD     . Insomnia 05/02/2011     Priority: Medium     Currently and historically using doses 100-300mg  trazodone qhs.  Hx symptoms concerning for long QT on doses >300mg  (self admin, non-adherent to prescribed dosage).     Marland Kitchen Anxiety and depression 11/10/2010     Priority: Medium     PHQ9  PHQ9 05/08/2012   PHQ-9 Total Score 7       PHQ9 11/17/2011   PHQ-9 Total Score 14          . COPD with acute exacerbation 12/30/2008     Priority: Medium     COPD exacerbations for year: 03/2011; 12/2011, 03/2012, 07/2012  No recent PFTs, likely GOLD 2 or 3.    mMRC likely 3, CAT  likely >21.    Former smoker, in remission.  No O2 requirement.  Current Meds:    - budesonide-formoterol (Symbicort) 160-4.5 BID    - albuterol PRN     - Combivent PRN and prednisone 40mg  daily for 5 days for exacerbation, call MD if using       . Coronary Artery Disease 03/19/2007     Priority: Medium     Stents placed; on aspirin + plavix    December 2010 Stress Echo  Overall impression:   1. Partially transient inferior wall defect consistent with limited mixed ischemia/infarction in the RCA distribution   2. Preserved left ventricular systolic function by gated SPECT.        . Pain, dental 05/30/2011     Priority: Low   . Bilateral presbyopia 05/30/2011     Priority: Low   . Dental caries 11/10/2010  Priority: Low   . Asthma 03/19/2007     Priority: Low     Created by Conversion       . Vocal cord disease 08/05/2012       The patient's problem list and interdisciplinary care plan was reviewed.    Discharge Planning                 *Does patient currently have home care services?: No     *Current External Services: None                      Plan    Anticipated Discharge Date:     Discharge Disposition: Home with Services [facility]

## 2012-08-06 ENCOUNTER — Telehealth: Payer: Self-pay

## 2012-08-06 NOTE — Telephone Encounter (Signed)
08/06/12 Nino? from [facility] called. Pt has been discharged from hospital and has returned home. HCR services have been resumed. HCR nurse will see Pt tomorrow 6/4. Today Pt BP 161/106, HR 74, O2 95%. Pt has no SOB, sounded good, and is energetic. Wanted Provider aware.

## 2012-08-07 ENCOUNTER — Telehealth: Payer: Self-pay | Admitting: Licensed Practical Nurse

## 2012-08-07 NOTE — Telephone Encounter (Signed)
Writer left message with [facility] requesting pt's CHN call back to discuss increasing pt's HHA hours. Currently has 1 hour per visit and that is not enough time. Pt requires assistance with meals and light housekeeping as well as with personal care/ADL's. One hour is only enough time for bathing and dressing.  Received call back from [facility], Efraim Kaufmann [phone]); she will request increase in HHA hours through pt's insurance. No guarantee, but she thinks it may be approved r/t pt's COPD and requires additional time for personal care.  CHN will be seeing pt tomorrow and will explain that they have to wait until insurance approves before increase can occur.

## 2012-08-08 ENCOUNTER — Encounter: Payer: Self-pay | Admitting: Gastroenterology

## 2012-08-08 ENCOUNTER — Telehealth: Payer: Self-pay

## 2012-08-08 NOTE — Telephone Encounter (Signed)
Melissa from [facility] left message stating pt was discharged with Crestor prescription ordered that pharmacy has not delivered yet because they are waiting for the PA, PA request will be sent over again today.  Melissa also reports pt has extensive bruising of lower abdominal area and some small bruises on both arms, residual from hospital meds.  Melissa states pt is still taking Aspirin 325 mg/ day and Plavix 75 mg/ day and wanted to notify provider; should these 2 meds be stopped until bleeding has subsided?  Please advise on Aspirin and Plavix to [address] nurse.  Thank you!

## 2012-08-12 ENCOUNTER — Telehealth: Payer: Self-pay

## 2012-08-12 ENCOUNTER — Ambulatory Visit: Payer: Self-pay | Admitting: Family Medicine

## 2012-08-12 ENCOUNTER — Encounter: Payer: Self-pay | Admitting: Family Medicine

## 2012-08-12 VITALS — BP 181/85 | HR 78 | Ht 64.17 in | Wt 244.0 lb

## 2012-08-12 DIAGNOSIS — I10 Essential (primary) hypertension: Secondary | ICD-10-CM

## 2012-08-12 DIAGNOSIS — F32A Depression, unspecified: Secondary | ICD-10-CM

## 2012-08-12 DIAGNOSIS — J449 Chronic obstructive pulmonary disease, unspecified: Secondary | ICD-10-CM

## 2012-08-12 DIAGNOSIS — M1710 Unilateral primary osteoarthritis, unspecified knee: Secondary | ICD-10-CM

## 2012-08-12 MED ORDER — HYDROXYZINE HCL 50 MG PO TABS *I*
50.0000 mg | ORAL_TABLET | Freq: Three times a day (TID) | ORAL | Status: DC | PRN
Start: 2012-08-12 — End: 2012-09-17

## 2012-08-12 MED ORDER — SERTRALINE HCL 50 MG PO TABS *I*
50.0000 mg | ORAL_TABLET | Freq: Every day | ORAL | Status: DC
Start: 2012-08-12 — End: 2012-09-02

## 2012-08-12 MED ORDER — WHEELCHAIR MISC *A*
Status: DC
Start: 2012-08-12 — End: 2012-09-27

## 2012-08-12 MED ORDER — HYDROXYZINE HCL 50 MG PO TABS *I*
50.0000 mg | ORAL_TABLET | Freq: Three times a day (TID) | ORAL | Status: DC | PRN
Start: 2012-08-12 — End: 2012-08-12

## 2012-08-12 MED ORDER — METOPROLOL SUCCINATE 100 MG PO TB24 *I*
100.0000 mg | ORAL_TABLET | Freq: Every day | ORAL | Status: DC
Start: 2012-08-12 — End: 2012-10-22

## 2012-08-12 NOTE — Telephone Encounter (Signed)
A few of the pt's scripts have gone to the [facility] lobby phcy INSTEAD of [facility]. Please be sure to send future scripts to [facility]> They took verbal orders for these as pt was there to pick up the medication, but it will be easier if they're sent to the correct phcy in future. Thank You!!!    [facility] has been removed from pt profile. If pt ever WANTS anything sent to them for filling it will have to be re-added. Thank You!!!

## 2012-08-12 NOTE — Progress Notes (Addendum)
Assessment & Plan        Gloria Ray is a 57 y.o. female and  has a past medical history of Asthma; COPD (chronic obstructive pulmonary disease); Coronary artery disease; Diabetes mellitus; Hypertension; Ischemic Stroke (03/06/2006); Prior Myocardial Infarction (03/19/2007); Unspecified cerebral artery occlusion with cerebral infarction; and Arthritis.       The patient evaluated today for: Follow-up and Hypertension       Anxiety and depression.  Uncontrolled.  Insomnia previously worse on SNRI.   Reinforced adherence to sertraline   Continue buspirone 15mg  BID   Add hydroxyzine    Hypertension.  Poorly controlled at home, good control during last hospitalization   Metoprolol XL to 100mg  daily, consider increase to 200mg    Consider testing for secondary causes of HTN    DJD (degenerative joint disease) of knee, bilateral.  Debilitating DJD.  Currently using walker, slow gait, at risk for fall.   Rx for motorized wheelchair.       Subjective        Chief Complaint: Follow-up and Hypertension    HPI       Anxiety and depression       Requesting new medication for depression.  Reports "breakup" with Gloria Ray, former fiance, on the phone with him prior to visit.  Has not started sertraline prescribed at the hospital.  Trazodone not helping with insomnia.    Hypertension       Continues to have elevated BPs at home.  Question of whether she is taking 50mg  or 100mg  of metoprolol.     DJD (degenerative joint disease) of knee, bilateral       PT requesting wheelchair.  Patient requesting motorized wheelchair, poor exercise capacity with COPD.      Pertinent positives and negatives of a 12-point review of systems noted above.       Objective   Recorded Vital Signs:  BP 181/85  Pulse 78  Ht 1.63 m (5' 4.17")  Wt 110.678 kg (244 lb)  BMI 41.66 kg/m2   Physical Exam   VITALS: Reviewed above and hypertensive.  BMI category is morbid obesity  GEN: Well developed, well nourished, appears stated age, no acute  distress  HEENT: Atraumatic.  EOM normal, conjunctiva clear.  Moist mucus membranes.  PULM: Lungs are clear to auscultation bilaterally with good effort.  No crackles, rales, or wheezes appreciated.  EXT: Warm and well perfused.  No edema.   SKIN: Warm and dry. No rashes, ecchymoses, or petechiae of exposed surfaces  NEURO:  Alert.  Cranial nerves grossly intact.  Strength is normal and symmetric.  Gait is slow, walking with wheeled walker.  Balance is impaired.    PSYCH:  Patient well dressed and well groomed, sitting in chair.  Speech is normal.  Mood is depressed.  Affect is congruent with mood.  Thought content is linear with no expressed paranoia, delusions, or hallucinations; no suicidal or homicidal ideation.  Judgement is poor.  Insight is fair.       Note in APSO format, see top for Assessment & Plan.     Gloria Patterson MD, PhD   R2 [facility]   08/12/2012  3:38 PM       --------------------    Preceptor Attestation - [facility] Medicine     I have reviewed the history, exam findings, and plan of care and discussed the care plan with the resident at the time of the visit. I agree with the resident's findings and plan of  care as documented above.    BPs up slightly. Taking meds according to home nursing.  ?confusion about metoprolol.      HOLLY ANN RUSSELL, MD

## 2012-08-12 NOTE — Assessment & Plan Note (Signed)
S/O:       Continues to have elevated BPs at home.  Question of whether she is taking 50mg  or 100mg  of metoprolol.  A/P:      Poorly controlled at home, good control during last hospitalization   Metoprolol XL to 100mg  daily, consider increase to 200mg    Consider testing for secondary causes of HTN

## 2012-08-12 NOTE — Assessment & Plan Note (Signed)
S/O:       Requesting new medication for depression.  Reports "breakup" with Undra, former fiance, on the phone with him prior to visit.  Has not started sertraline prescribed at the hospital.  Trazodone not helping with insomnia.  A/P:      Uncontrolled.  Insomnia previously worse on SNRI.   Reinforced adherence to sertraline   Continue buspirone 15mg  BID   Add hydroxyzine

## 2012-08-12 NOTE — Assessment & Plan Note (Signed)
S/O:       PT requesting wheelchair.  Patient requesting motorized wheelchair, poor exercise capacity with COPD.  A/P:      Debilitating DJD.  Currently using walker, slow gait, at risk for fall.   Rx for motorized wheelchair.

## 2012-08-12 NOTE — Patient Instructions (Signed)
   Start taking hydroxyzine 1 tab (50mg ) before bed.  You may take 2 tabs (100mg ) if not effective.  Max 3 tabs per day.   Start taking sertraline (Zoloft) as prescribed.  50mg  before bed; after 2 weeks increase to 100mg

## 2012-08-16 ENCOUNTER — Telehealth: Payer: Self-pay

## 2012-08-16 NOTE — Telephone Encounter (Signed)
08/16/12 Melissa, HCR nurse, called after visiting Pt. Pt states she has 7/10 pain bilaterally in her knees. Pt has not been taking her prn morphine for pain. HCR nurse reviewed this medication with PT. Pt's BG was 316. BG taken after eating lunch and Pt has been noncompliant with taking her insulin as instructed. HCR reviewed insulin directions with Pt. Pt is having dental extraction on 6/20 at [facility]. Pt has been cleared medically. Pt's aspirin and Plavix have been removed from Pt's mediset 7 days prior to dental surgery by [facility] nurse. Wanted Provider aware.

## 2012-08-19 ENCOUNTER — Other Ambulatory Visit: Payer: Self-pay | Admitting: Family Medicine

## 2012-08-21 ENCOUNTER — Encounter: Payer: Self-pay | Admitting: Emergency Medicine

## 2012-08-21 ENCOUNTER — Other Ambulatory Visit: Payer: Self-pay | Admitting: Gastroenterology

## 2012-08-21 ENCOUNTER — Emergency Department
Admit: 2012-08-21 | Disposition: A | Discharge status: Home / Self Care | Payer: Self-pay | Source: Ambulatory Visit | Attending: Emergency Medicine | Admitting: Emergency Medicine

## 2012-08-21 LAB — BASIC METABOLIC PANEL
Anion Gap: 12 (ref 7–16)
CO2: 21 mmol/L (ref 20–28)
Calcium: 8.8 mg/dL (ref 8.6–10.2)
Chloride: 106 mmol/L (ref 96–108)
Creatinine: 1.02 mg/dL — ABNORMAL HIGH (ref 0.51–0.95)
GFR,Black: 71 *
GFR,Caucasian: 62 *
Glucose: 239 mg/dL — ABNORMAL HIGH (ref 60–99)
Lab: 18 mg/dL (ref 6–20)
Potassium: 3.4 mmol/L (ref 3.3–5.1)
Sodium: 139 mmol/L (ref 133–145)

## 2012-08-21 LAB — CBC AND DIFFERENTIAL
Baso # K/uL: 0 10*3/uL (ref 0.0–0.1)
Basophil %: 0.1 % (ref 0.1–1.2)
Eos # K/uL: 0 10*3/uL (ref 0.0–0.4)
Eosinophil %: 0.4 % — ABNORMAL LOW (ref 0.7–5.8)
Hematocrit: 35 % (ref 34–45)
Hemoglobin: 10.8 g/dL — ABNORMAL LOW (ref 11.2–15.7)
Lymph # K/uL: 1.1 10*3/uL — ABNORMAL LOW (ref 1.2–3.7)
Lymphocyte %: 16.7 % — ABNORMAL LOW (ref 19.3–51.7)
MCV: 81 fL (ref 79–95)
Mono # K/uL: 0.5 10*3/uL (ref 0.2–0.9)
Monocyte %: 6.9 % (ref 4.7–12.5)
Neut # K/uL: 5.2 10*3/uL (ref 1.6–6.1)
Platelets: 213 10*3/uL (ref 160–370)
RBC: 4.3 MIL/uL (ref 3.9–5.2)
RDW: 18.2 % — ABNORMAL HIGH (ref 11.7–14.4)
Seg Neut %: 75.8 % — ABNORMAL HIGH (ref 34.0–71.1)
WBC: 6.8 10*3/uL (ref 4.0–10.0)

## 2012-08-21 LAB — RUQ PANEL (ED ONLY)
ALT: 16 U/L (ref 0–35)
AST: 13 U/L (ref 0–35)
Albumin: 3.5 g/dL (ref 3.5–5.2)
Alk Phos: 67 U/L (ref 35–105)
Amylase: 30 U/L (ref 28–100)
Bilirubin,Direct: <0.2 mg/dL (ref 0.0–0.3)
Bilirubin,Total: 0.2 mg/dL (ref 0.0–1.2)
Globulin: 2.6 g/dL — ABNORMAL LOW (ref 2.7–4.3)
Lipase: 66 U/L — ABNORMAL HIGH (ref 13–60)
Total Protein: 6.1 g/dL — ABNORMAL LOW (ref 6.3–7.7)

## 2012-08-21 LAB — URINALYSIS REFLEX TO CULTURE
Blood,UA: NEGATIVE
Glucose,UA: 1000 mg/dL — AB
Ketones, UA: NEGATIVE
Leuk Esterase,UA: NEGATIVE
Nitrite,UA: POSITIVE — AB
Protein,UA: 75 mg/dL — AB
Specific Gravity,UA: 1.024 (ref 1.001–1.030)
pH,UA: 5 (ref 5.0–8.0)

## 2012-08-21 LAB — URINE MICROSCOPIC (IQ200): RBC,UA: NONE SEEN /hpf (ref 0–2)

## 2012-08-21 LAB — POCT GLUCOSE: Glucose POCT: 203 mg/dL — ABNORMAL HIGH (ref 60–99)

## 2012-08-21 MED ORDER — LOPERAMIDE HCL 2 MG PO CAPS *I*
2.0000 mg | ORAL_CAPSULE | Freq: Four times a day (QID) | ORAL | Status: AC | PRN
Start: 2012-08-21 — End: 2012-08-31

## 2012-08-21 MED ORDER — LOPERAMIDE HCL 2 MG PO CAPS *I*
2.0000 mg | ORAL_CAPSULE | Freq: Four times a day (QID) | ORAL | Status: DC | PRN
Start: 2012-08-21 — End: 2012-08-21

## 2012-08-21 NOTE — ED Provider Notes (Signed)
History     Chief Complaint   Patient presents with   . Nausea     Pt has not been feeling well for a couple of days, stated that she got a call from nursing services that her bp was in the 90s. BG this morning was "high" took 30 units of humalog "about an hour or so ago"    . Emesis   . Weakness     HPI Comments: 57 year old female with a history of COPD, CAD, DM, HTN presents to the ED for diarrhea and abdominal discomfort. She states she has had 2 weeks of loose stools, no recent antibiotics, hematochezia, melena, fever, chills. Abdominal pain is dull diffuse, 2/10.      History provided by:  Patient and medical records      Past Medical History   Diagnosis Date   . Asthma    . COPD (chronic obstructive pulmonary disease)    . Coronary artery disease    . Diabetes mellitus    . Hypertension    . Ischemic Stroke 03/06/2006     Created by Conversion    . Prior Myocardial Infarction 03/19/2007     Created by Conversion    . Unspecified cerebral artery occlusion with cerebral infarction      2008   . Arthritis             Past Surgical History   Procedure Laterality Date   . Coronary angioplasty with stent placement     . Bunionectomy         Family History   Problem Relation Age of Onset   . Diabetes Other    . Heart disease Other    . Hypertension Other    . Coronary art dis Other    . Hypertension Mother    . Diabetes Mother    . Alcohol abuse Father    . Other Father      cirrhosis   . Arthritis Sister    . Substance abuse Sister    . Other Brother      HIV   . Substance abuse Brother    . Diabetes Brother          Social History      reports that she quit smoking about 23 months ago. Her smoking use included Cigarettes. She smoked 0.00 packs per day. She has never used smokeless tobacco. She reports that she does not drink alcohol or use illicit drugs. Her sexual activity history is not on file.    Living Situation    Questions Responses    Patient lives with Alone    Homeless No    Caregiver for other family  member No    External Services Home Care Services    Comment:  Visiting nurse     Employment Retired    Domestic Violence Risk No          Problem List     Patient Active Problem List   Diagnosis Code   . Coronary Artery Disease 414.00   . Hypertension 401.9   . Asthma 493.90   . Type II, uncontrolled 250.02   . COPD (chronic obstructive pulmonary disease) 496   . Dental caries 521.00   . Anxiety and depression 300.4   . Insomnia 780.52   . Pain, dental 525.9   . Bilateral presbyopia 367.4   . 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 Controlled substance agreement signed V58.69   .  Chest heaviness 786.59   . Vocal cord disease 478.5       Review of Systems   Review of Systems   Constitutional: Negative for fever, chills, diaphoresis and fatigue.   HENT: Negative for congestion, sore throat and rhinorrhea.    Eyes: Negative for visual disturbance.   Respiratory: Negative for cough and shortness of breath.    Cardiovascular: Negative for chest pain and palpitations.   Gastrointestinal: Positive for nausea, abdominal pain and diarrhea. Negative for vomiting, blood in stool, abdominal distention and anal bleeding.   Genitourinary: Negative for dysuria, urgency, flank pain and decreased urine volume.   Musculoskeletal: Negative for myalgias and arthralgias.   Skin: Negative for pallor, rash and wound.   Neurological: Negative for light-headedness and headaches.   Hematological: Negative for adenopathy.   Psychiatric/Behavioral: Negative for confusion and agitation.       Physical Exam     ED Triage Vitals   BP Heart Rate Heart Rate(via Pulse Ox) Resp Temp Temp Source SpO2 O2 Device O2 Flow Rate   08/21/12 1115 08/21/12 1115 -- 08/21/12 1115 08/21/12 1115 08/21/12 1115 08/21/12 1115 08/21/12 1115 --   112/70 mmHg 84  18 36.6 C (97.9 F) TEMPORAL 96 % None (Room air)       Weight           08/21/12 1115           102.967 kg (227 lb)               Physical Exam   Nursing note and vitals  reviewed.  Constitutional: She is oriented to person, place, and time. She appears well-developed and well-nourished. No distress.   HENT:   Head: Normocephalic and atraumatic.   Mouth/Throat: Oropharynx is clear and moist.   Eyes: Conjunctivae are normal. Pupils are equal, round, and reactive to light.   Neck: Neck supple.   Cardiovascular: Normal rate, regular rhythm and normal heart sounds.    No murmur heard.  Pulmonary/Chest: Effort normal and breath sounds normal. No respiratory distress.   Abdominal: Soft. Normal appearance and bowel sounds are normal. She exhibits no distension and no mass. There is no hepatosplenomegaly. There is no tenderness. There is no rigidity, no rebound, no guarding, no CVA tenderness, no tenderness at McBurney's point and negative Murphy's sign.   Musculoskeletal: Normal range of motion. She exhibits no edema.   Lymphadenopathy:     She has no cervical adenopathy.   Neurological: She is alert and oriented to person, place, and time.   Skin: Skin is warm and dry. No rash noted. She is not diaphoretic. No erythema.   Psychiatric: She has a normal mood and affect. Her behavior is normal. Judgment and thought content normal.       Medical Decision Making      Amount and/or Complexity of Data Reviewed  Clinical lab tests: ordered and reviewed  Tests in the medicine section of CPT: ordered and reviewed  Review and summarize past medical records: yes        Initial Evaluation:  ED First Provider Contact    Date/Time Event User Comments    08/21/12 1235 ED Provider First Contact Samantha Olivera Initial Face to Face Provider Contact          Patient seen by me as above    Assessment:  57 y.o., female comes to the ED with diarrhea    Differential Diagnosis includes: diarrhea, volume depletion, hyperglycemia from DM. NO evidence for UTI, electrolyte  derangements, anemia or other acute abnormality.             Plan:     1. CBC, BMP, RUQ  2. Urinalysis  3. PO loperamide for diarrhea  control    Disposition: discharge home, follow up with PCP in 1 week if symptoms persist      Marylou Mccoy, MD          Marylou Mccoy, MD  08/21/12 1729

## 2012-08-21 NOTE — ED Notes (Signed)
Dr. Bartholome Bill bedside assessing pt.

## 2012-08-21 NOTE — Progress Notes (Signed)
Home Health Assessment    Completed by: Walker Kehr, RN  Phone: [phone]      Referred by: Janet Berlin, SW      Source of Information: medical record      Home Health indicators present: Recent home care agency active prior to admission (new referral required)      Plan: Home care referral started; agency chosen [facility]      Comments: Pt receiving skilled nursing and HHA services from [facility] prior to admission. [facility]CR will follow pt's hospital progress and will assist with pt's transition to home, when appropriate.     Walker Kehr, BSN RN  [phone]    After hours please call [phone]

## 2012-08-21 NOTE — ED Notes (Signed)
Pt discharged home, agrees with the current plan, able to verbalize all discharge instructions without difficulty, reminded to follow up with the pcp.

## 2012-08-21 NOTE — Comprehensive Assessment (Signed)
08/21/12 1247   Demographics   Religious/Cultural Factors Other (comment)   Comments [facility]   Marital Status Not married   Ethnicity/Race African American   Risk Factors   Risk Factors Adjustment to Dx/Injury/Illness;Functional impairment   Contact Information   Spokesperson Name Skyylar Frate   Relationship to Patient  Daughter   Phone Number(s) [phone]   Alternate Spokesperson? Yes   Spokesperson Alternate Aram Candela    Relationship to Patient  Daughter   Phone Number(s) [phone]   Emergency Contact other than spokesperson? Yes   Emergency Contact Gaylan Gerold    Relationship to Patient  Other relative  (aunt--lives in [place])   Phone Number(s) [phone]   Living Situation   Lives With Alone   Comments [facility] Geography   Type of Home Apartment   # Of Steps In Home 0   # Steps to Enter Home 0  (elevator access)   One Story or Two One story   Bedroom First floor   Bathroom First floor   Utilitites Working Yes   Psychosocial   Coping Status Well   Current Goal of Care Curative   Comments hopeful to return home today   Activities of Daily Living   Transfers Independent   Assistive Device Cane;Walker   Ambulation Independent  (with cane or rollator walker )   Bathing/Grooming With assistance  ([facility] aide assists daily)   Nutrition With assistance  (aide assists)   Household Management With assistance  (aide assists)   Scientist, 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   Bobbye Riggs, LMSW  08/21/2012  12:52 PM

## 2012-08-21 NOTE — ED Notes (Signed)
Bed: C-24  Expected date: 08/21/12  Expected time: 11:12 AM  Means of arrival:   Comments:

## 2012-08-21 NOTE — Comprehensive Assessment (Signed)
08/21/12 0000   Discharge Planning   Lives With Alone;Other (comment)  ([facility] )   Can they assist with pt needs after discharge? Yes   *Does patient currently have home care services? Yes   If yes, which agency? [facility]   *Current External Services None   Current Home Equipment Walker;Cane;Grab bars;Shower chair;Commode;Hospital bed   Expected Discharge Date 08/21/12   Follow for: Discharge Planning   SW Plan SW to follow (see discharge plan)   Interventions   Interventions Homecare service referral initiated   Information obtained from pt at bedside. Pt resides in an apartment at [facility] ([facility] of [facility] [phone]) and has lived there for about 5 months. She is independent with a cane or walker at baseline, reports that she was fitted for an electric scooter yesterday and that it should be delivered in about a month. She has aide service daily through [facility] for assist with meals, personal care, and household management. With pt's permission, SW spoke with [facility] from [facility] [phone]) to let her know that pt is here. Hopeful for d/c today.  Bobbye Riggs, LMSW  08/21/2012  12:55 PM

## 2012-08-21 NOTE — ED Notes (Signed)
Pt to ED c/o n/v and not feeling well x 2 weeks.  Pt states her BG's have been fluctuating for the past 2 weeks as well.  Awaiting provider to assess pt.

## 2012-08-23 LAB — AEROBIC CULTURE

## 2012-08-26 LAB — EKG 12-LEAD
P: 60 degrees
QRS: -1 degrees
Rate: 80 {beats}/min
Severity: NORMAL
Severity: NORMAL
T: 89 degrees

## 2012-08-27 ENCOUNTER — Other Ambulatory Visit: Payer: Self-pay | Admitting: Family Medicine

## 2012-08-27 ENCOUNTER — Other Ambulatory Visit: Payer: Self-pay

## 2012-08-27 DIAGNOSIS — M25569 Pain in unspecified knee: Secondary | ICD-10-CM

## 2012-08-27 NOTE — Telephone Encounter (Signed)
Send script to [facility]

## 2012-08-28 MED ORDER — ACETAMINOPHEN 500 MG PO TABS *I*
1000.0000 mg | ORAL_TABLET | Freq: Three times a day (TID) | ORAL | Status: DC | PRN
Start: 2012-08-27 — End: 2012-10-31

## 2012-08-30 ENCOUNTER — Other Ambulatory Visit: Payer: Self-pay

## 2012-08-30 DIAGNOSIS — M25569 Pain in unspecified knee: Secondary | ICD-10-CM

## 2012-09-01 ENCOUNTER — Other Ambulatory Visit: Payer: Self-pay | Admitting: Family Medicine

## 2012-09-01 ENCOUNTER — Telehealth: Payer: Self-pay | Admitting: Family Medicine

## 2012-09-01 MED ORDER — MORPHINE SULFATE ER 30 MG PO TBCR *I*
60.0000 mg | ORAL_TABLET | Freq: Two times a day (BID) | ORAL | Status: DC | PRN
Start: 2012-08-30 — End: 2012-09-02

## 2012-09-01 NOTE — Telephone Encounter (Signed)
[facility] Family Medicine: After Hours / On-Call Note    Date of Call: 09/01/12  Time of Call: 1pm    CCP:  Juliane Poot    Message: Gigi Gin from [facility] called regarding weight gain. Reports pt gained 5 lbs since yesterday. No SOB, facial/hand/lower extremity swelling. Has been urinating/defecating normally. Peggy unsure of last BM. Not on any diuretics.    Impression: Weight gain - fluid retention vs constipation vs error. Asymptomatic.    Disposition/Plan:  No symptoms but pt should be seen in clinic this week. [facility] to recheck weight tomorrow.    Fritz Pickerel, MD on 09/01/2012 at 1:42 PM

## 2012-09-02 ENCOUNTER — Telehealth: Payer: Self-pay | Admitting: Family Medicine

## 2012-09-02 DIAGNOSIS — M25569 Pain in unspecified knee: Secondary | ICD-10-CM

## 2012-09-04 ENCOUNTER — Ambulatory Visit: Payer: Self-pay | Admitting: Sports Medicine

## 2012-09-04 MED ORDER — MORPHINE SULFATE ER 30 MG PO TBCR *I*
30.0000 mg | ORAL_TABLET | Freq: Two times a day (BID) | ORAL | Status: DC | PRN
Start: 2012-09-02 — End: 2012-09-05

## 2012-09-04 NOTE — Telephone Encounter (Signed)
Spoke to Rina at [facility], who reports pt weight on 6/30 went back down to 231.9 (from 235.1 on 6/29)  It has remained stable since reading 231.4 today.  Today BP 146/85, P79, O2 sat95%.  (all telehealth readings)  Pt remains asymptomatic. They will continue to monitor remotely daily as well as weekly nursing visits.  Next appt 7/11 with Dr Juliane Poot.  Please advise if pt needs to be seen sooner or increase frequency of home nursing visits.

## 2012-09-05 ENCOUNTER — Other Ambulatory Visit: Payer: Self-pay | Admitting: Geriatric Medicine

## 2012-09-05 DIAGNOSIS — M25569 Pain in unspecified knee: Secondary | ICD-10-CM

## 2012-09-05 MED ORDER — MORPHINE SULFATE ER 30 MG PO TBCR *I*
30.0000 mg | ORAL_TABLET | Freq: Two times a day (BID) | ORAL | Status: DC | PRN
Start: 2012-09-05 — End: 2012-09-27

## 2012-09-09 ENCOUNTER — Other Ambulatory Visit: Payer: Self-pay | Admitting: Family Medicine

## 2012-09-10 ENCOUNTER — Other Ambulatory Visit: Payer: Self-pay | Admitting: Family Medicine

## 2012-09-11 ENCOUNTER — Encounter: Payer: Self-pay | Admitting: Gastroenterology

## 2012-09-13 ENCOUNTER — Encounter: Payer: Self-pay | Admitting: Family Medicine

## 2012-09-13 ENCOUNTER — Ambulatory Visit: Payer: Self-pay | Admitting: Family Medicine

## 2012-09-16 ENCOUNTER — Encounter: Payer: Self-pay | Admitting: Family Medicine

## 2012-09-16 DIAGNOSIS — N390 Urinary tract infection, site not specified: Secondary | ICD-10-CM | POA: Insufficient documentation

## 2012-09-17 ENCOUNTER — Other Ambulatory Visit: Payer: Self-pay

## 2012-09-17 DIAGNOSIS — F32A Depression, unspecified: Secondary | ICD-10-CM

## 2012-09-18 ENCOUNTER — Ambulatory Visit: Payer: Self-pay | Admitting: Ophthalmology

## 2012-09-18 ENCOUNTER — Telehealth: Payer: Self-pay

## 2012-09-18 MED ORDER — SERTRALINE HCL 50 MG PO TABS *I*
50.0000 mg | ORAL_TABLET | Freq: Every day | ORAL | Status: DC
Start: 2012-09-17 — End: 2012-09-23

## 2012-09-18 MED ORDER — HYDROXYZINE HCL 50 MG PO TABS *I*
50.0000 mg | ORAL_TABLET | Freq: Three times a day (TID) | ORAL | Status: DC | PRN
Start: 2012-09-17 — End: 2013-04-16

## 2012-09-18 NOTE — Telephone Encounter (Signed)
TC from [place], RN with [facility] telehealth.  Pt reporting wt increase in last 4 days i.e., on 7/13 was 230 lb, today, 7/16 234.9 lbs.  Pt without symptoms of increased SOB, CP, dependent edema, or cough.  Appt has been set up for pt f/u on Fri 7/25.    Caller is asking if provider can review as to need for earlier appt.  Caller can be reached at [phone] with questions.

## 2012-09-19 NOTE — Telephone Encounter (Signed)
Data reviewed.  No need for sooner appointment, good for 7/25.    [name] MD, PhD  Family Medicine, R3  09/19/2012 5:43 PM

## 2012-09-20 ENCOUNTER — Other Ambulatory Visit: Payer: Self-pay

## 2012-09-20 NOTE — Telephone Encounter (Signed)
Can we have a new Sertraline prescription for next month with the current dose without the taper instructions on it? I believe she has already tapered up to the 2 tablets daily on the previous Rx. I'm dispensing the Rx from 7/16 in the meantime just to make sure the patient doesn't go without the medication. Thank you, Clayburn Pert

## 2012-09-20 NOTE — Telephone Encounter (Signed)
RCT # provided in note below - s/w Vernona Rieger, teleheath nurse and advised of Dr. Wallie Renshaw remarks.  She verbalizes understanding.  Melida Gimenez, RN

## 2012-09-23 ENCOUNTER — Telehealth: Payer: Self-pay

## 2012-09-23 ENCOUNTER — Ambulatory Visit: Payer: Self-pay | Admitting: Ophthalmology

## 2012-09-23 ENCOUNTER — Other Ambulatory Visit: Payer: Self-pay | Admitting: Family Medicine

## 2012-09-23 MED ORDER — SERTRALINE HCL 100 MG PO TABS *I*
100.0000 mg | ORAL_TABLET | Freq: Every day | ORAL | Status: AC
Start: 2012-09-23 — End: 2013-03-22

## 2012-09-23 NOTE — Telephone Encounter (Signed)
Melissa, RN with [facility] reporting pt has been recertified for home care nursing.  Melissa stated pt is now independent with her pill box but had to be strongly encouraged to call for her refills.  Melissa stated pt has gained 5 lbs in the last weke, seems to be stable now but has noticed pt has increased SOB, pt denies this.  Melissa also stated pt has +1 pitting edema of bilat lower extremities and O2 was 91%.  Melissa stated she discussed low sodium diet with pt.  Pt is being monitored by Telehealth, Melissa to do an extra visit with pt Thursday.  Pt has upcoming [facility] appt 09/27/12  Call Melissa with any med additions or changes, please.

## 2012-09-27 ENCOUNTER — Encounter: Payer: Self-pay | Admitting: Family Medicine

## 2012-09-27 ENCOUNTER — Ambulatory Visit: Payer: Self-pay | Admitting: Family Medicine

## 2012-09-27 VITALS — BP 151/87 | HR 81 | Temp 97.7°F | Ht 64.0 in | Wt 240.0 lb

## 2012-09-27 DIAGNOSIS — J449 Chronic obstructive pulmonary disease, unspecified: Secondary | ICD-10-CM

## 2012-09-27 DIAGNOSIS — M1710 Unilateral primary osteoarthritis, unspecified knee: Secondary | ICD-10-CM

## 2012-09-27 DIAGNOSIS — M25569 Pain in unspecified knee: Secondary | ICD-10-CM

## 2012-09-27 MED ORDER — AMLODIPINE BESYLATE 10 MG PO TABS *I*
10.0000 mg | ORAL_TABLET | Freq: Every day | ORAL | Status: DC
Start: 2012-09-27 — End: 2012-12-05

## 2012-09-27 MED ORDER — PREDNISONE 20 MG PO TABS *I*
40.0000 mg | ORAL_TABLET | Freq: Every day | ORAL | Status: DC
Start: 2012-09-27 — End: 2012-12-20

## 2012-09-27 MED ORDER — WHEELCHAIR MISC *A*
Status: DC
Start: 2012-09-27 — End: 2013-01-10

## 2012-09-27 MED ORDER — MORPHINE SULFATE ER 15 MG PO TBCR *I*
15.0000 mg | ORAL_TABLET | Freq: Three times a day (TID) | ORAL | Status: AC | PRN
Start: 2012-09-27 — End: 2012-10-27

## 2012-09-27 NOTE — Progress Notes (Addendum)
Subjective                      Chief Complaint: Asthma, Hypertension and Edema    HPI  DJD (degenerative joint disease) of knee, bilateral       Patient requesting motorized wheelchair, currently unable to walk to group activities and mobility around house is impaired.  Has increased home nursing to help with ADLs.  Not interested in pursuing Ortho or path to knee replacements at this time.  Declines knee injections at this time    COPD (chronic obstructive pulmonary disease)        Feels she is at the early stages of an exacerbation.  Does not currently have prednisone at home.  Has inhalers, increased use.  Denies waking from sleep at this time, but SOB does increase insomnia.  Home nursing notes weight gain, concerned about heart failure.  Denies LE edema.     HTN       Has home monitoring, consistently high.  Reports taking medications prescribed however out of amlodipine as lost medication.  Denies headache, vision changes, chest pain, palpitations, dizziness, loss of consciousness or loss of motor function.     History       Reviewed and updated where appropriate based on HPI and patient report.      ROS       Pertinent positives and negatives of a 12-point review of systems noted above.       Objective   Recorded Vital Signs:             BP 151/87  Pulse 81  Temp(Src) 36.5 C (97.7 F) (Temporal)  Ht 1.626 m (5\' 4" )  Wt 108.863 kg (240 lb)  BMI 41.18 kg/m2  SpO2 96%               Wt Readings from Last 7 Encounters:   09/27/12 108.863 kg (240 lb)   08/21/12 102.967 kg (227 lb)   08/12/12 110.678 kg (244 lb)   08/05/12 110.768 kg (244 lb 3.2 oz)   07/23/12 104.327 kg (230 lb)   06/26/12 101.606 kg (224 lb)   06/14/12 102.513 kg (226 lb)                   Physical Exam   VITALS: Reviewed above and WNL.  BMI category is morbid obesith with 15lb weight gain in 3 months.  GEN: Well developed, well nourished, appears stated age, no acute distress  HEENT: Atraumatic.  EOM normal, conjunctiva clear.  RESP:  Unlabored breaths, baseline shallow breathing.  Lungs are clear to auscultation bilaterally with good effort.  No crackles, rales, or wheezes appreciated.  CV: Regular rhythm, S1, S2.  No S3, S4, murmurs or rubs.   EXT: Warm and well perfused.  No edema.   SKIN: Warm and dry. No rashes, ecchymoses, or petechiae of exposed surfaces  NEURO:  Alert.  Cranial nerves grossly intact.  Strength is normal and symmetric.  Gait is abnormal, slow, walking with cane.  Balance is abnormal, supported by cane.    PSYCH:  Patient well dressed and well groomed, sitting in chair.  Speech is normal.  Mood is depressed.  Affect is congruent with mood.  Thought content is concrete; no expressed paranoia, delusions, or hallucinations.  Judgement is poor.  Insight is poor.     Labs  Reviewed:  Lab Results   Component Value Date    HA1C 12.2* 06/26/2012    HA1C 10.7*  03/17/2012    HA1C 8.3* 05/29/2011    MALBR 47.41 03/17/2012    CREAT 1.02* 08/21/2012    LDLC 199 06/26/2012      Imaging  Reviewed:   07/29/2011 L and R Knee X-ray  FINDINGS: There is no evidence of acute fracture, dislocation, or   acute bony abnormality. There is mild-moderate tricompartmental   arthritic change, most notably involving the medial compartment.   There are prominent vascular calcifications.   IMPRESSION: Mild-moderate degenerative change.         Assessment & Plan        Gloria Ray is a 57 y.o. female and  has a past medical history of Asthma; COPD (chronic obstructive pulmonary disease); Coronary artery disease; Diabetes mellitus; Hypertension; Ischemic Stroke (03/06/2006); Prior Myocardial Infarction (03/19/2007); Unspecified cerebral artery occlusion with cerebral infarction; and Arthritis.    DJD (degenerative joint disease) of knee, bilateral   wheelchair; Dispense 1 motorized wheelchair   Taper morphine (MS CONTIN) 15 MG 12 hr tablet; Take 1 tablet (15 mg total) by mouth 3 times daily as needed for Severe Pain   MDD 45 mg 30 day supply   Continue to  offer cortisone injections and re-engagement of Ortho if motivate, currently poor surgical candidate due to uncontrolled medical comorbidities    COPD (chronic obstructive pulmonary disease)   wheelchair; Dispense 1 motorized wheelchair   predniSONE (DELTASONE) 20 MG tablet; Take 2 tablets (40 mg total) by mouth daily    HTN.  Uncontrolled.  Historically due to non-adherence, however may need escalated regimen due to recent weight gain and dietary indescretion.   amLODIPine (NORVASC) 10 MG tablet; Take 1 tablet (10 mg total) by mouth daily   Consider adding clonidine as weaned from opiates and with poorly controlled BP         [name] MD, PhD   R2 [facility]   09/27/2012  1:34 PM       --------------------    Preceptor Attestation     I saw and evaluated the patient, and discussed the care with the resident. I agree with the resident's/fellow's findings and plan of care as documented above. Details of my evaluation are as follows:   S: copd on inhalers, flare  djd  Poor dentition  O: BP 151/87  Pulse 81  Temp(Src) 36.5 C (97.7 F) (Temporal)  Ht 1.626 m (5\' 4" )  Wt 108.863 kg (240 lb)  BMI 41.18 kg/m2  SpO2 96%  Pleasant nad  A/P: start pred  Paperwork for scooter, taper narcs  Ref to dental - cardiac clear    Gloria Hazard, MD, MPH  [facility] Attending  1:38 PM

## 2012-10-01 NOTE — Progress Notes (Signed)
Mailed hard copy script to patient home address today/cll

## 2012-10-02 ENCOUNTER — Telehealth: Payer: Self-pay

## 2012-10-02 ENCOUNTER — Other Ambulatory Visit: Payer: Self-pay | Admitting: Family Medicine

## 2012-10-02 NOTE — Telephone Encounter (Signed)
Received call from [facility] says, pt. called in Tele Health B/P of 150/99 HR 73 02 Sat of 94% this AM.  She had c/o a toothache and took tylenol with relief.    This afternoon B/P 143/108 HR 79, along with c/o HA.  Plan is to send a nurse out to see her today for f/u and they will continue to follow. Thanks.

## 2012-10-07 ENCOUNTER — Encounter: Payer: Self-pay | Admitting: Gastroenterology

## 2012-10-09 ENCOUNTER — Encounter: Payer: Self-pay | Admitting: Ophthalmology

## 2012-10-09 ENCOUNTER — Ambulatory Visit: Payer: Self-pay | Admitting: Ophthalmology

## 2012-10-09 ENCOUNTER — Telehealth: Payer: Self-pay

## 2012-10-09 DIAGNOSIS — IMO0001 Reserved for inherently not codable concepts without codable children: Secondary | ICD-10-CM

## 2012-10-09 LAB — HM DIABETES EYE EXAM

## 2012-10-09 MED ORDER — ARTIFICIAL TEARS (POLYVINYL ALCOHOL) 1.4 % OP SOLN *I*
1.0000 [drp] | OPHTHALMIC | Status: AC | PRN
Start: 2012-10-09 — End: ?

## 2012-10-09 NOTE — Telephone Encounter (Signed)
Gloria Ray calling in asking about paperwork for [facility] Wheelchair to supply a scooter for her. I did not see this in her chart. Do you have any paperwork for this?

## 2012-10-09 NOTE — Progress Notes (Addendum)
Outpatient Visit      Patient name: Gloria Ray  DOB: Sep 11, 1955       Age: 57 y.o.  MR#: [id number]    Encounter Date: 10/09/2012    Subjective:     Chief Complaint:   Chief Complaint   Patient presents with   . Blurred Vision     HPI    56yF with IDDM (>20 years), HTN, CAD presents for new patient visit. Has   noted increasingly blurred vision OU distance and near for about 6-12   months. Wears glasses does not have them with her. Denies flashes,   floaters, diplopia, eye pain. Has been told in the past that she has   diabetic retinopathy, denies any treatment with laser or injection.    Last A1c 8.3 (05/2011)  POcHx: glasses wear, denies surgery/trauma or amblyopia  FMH: no known history of glaucoma        Current Outpatient Rx   Name  Route  Sig  Dispense  Refill   . polyvinyl alcohol (LIQUIFILM TEARS) 1.4 % ophthalmic solution    Both Eyes    Place 1 drop into both eyes as needed for Dry Eyes    15 mL    10     . aspirin 325 MG tablet        TAKE 1 TABLET BY MOUTH DAILY    30 tablet    2     . amLODIPine (NORVASC) 10 MG tablet    Oral    Take 1 tablet (10 mg total) by mouth daily    90 tablet    3     . wheelchair        Dispense 1 motorized wheelchair  Dx: DJD (ICD-9 715.96); COPD (ICD-9 496)  Needed for lifetime    1 each    0     . morphine (MS CONTIN) 15 MG 12 hr tablet    Oral    Take 1 tablet (15 mg total) by mouth 3 times daily as needed for Severe Pain   MDD 45 mg 30 day supply    90 tablet    0     . busPIRone (BUSPAR) 30 MG tablet        TAKE 1 TABLET BY MOUTH TWO TIMES A DAY    60 tablet    0     . sertraline (ZOLOFT) 100 MG tablet    Oral    Take 1 tablet (100 mg total) by mouth daily    30 tablet    5     . hydrOXYzine HCl (ATARAX) 50 MG tablet    Oral    Take 1 tablet (50 mg total) by mouth 3 times daily as needed for Itching or Anxiety    90 tablet    0     . gabapentin (NEURONTIN) 300 MG capsule        TAKE 1 CAPSULE BY MOUTH NIGHTLY.    30 capsule    1     . acetaminophen  (ACETAMINOPHEN EXTRA STRENGTH) 500 mg tablet    Oral    Take 2 tablets (1,000 mg total) by mouth 3 times daily as needed   FOR PAIN. MAX/DAY=6 TABLETS. DECREASE BY 1 TABLET FOR EVERY LORTAB    60 tablet    5     . metoprolol (TOPROL-XL) 100 MG 24 hr tablet    Oral    Take 1 tablet (100 mg total) by mouth daily  Do not crush or chew. May be divided.    30 tablet    5     . tiotropium (SPIRIVA) 18 MCG inhalation capsule    Inhalation    Inhale 1 capsule (18 mcg total) into the lungs daily    30 capsule    1     . generic DME        Use as directed.    1 each    0       Date that service is required: 08/05/2012   Exact Eq ...     . generic DME        Use as directed.    1 each    0       Date that service is required: 08/05/2012   Exact Eq ...     . polyethylene glycol (GLYCOLAX/MIRALAX) PACK powder    Oral    Take 17 g by mouth daily    100 packet    1     . docusate sodium (COLACE) 100 MG capsule    Oral    Take 1 capsule (100 mg total) by mouth daily    30 capsule    1     . insulin glargine (LANTUS SOLOSTAR) 100 UNIT/ML injection pen        Inject 50 Units at 2 separate sites for total 100 Units.    30 mL    5     . albuterol-ipratropium (COMBIVENT) 18-103 MCG/ACT inhaler    Inhalation    Inhale 1-2 puffs into the lungs every 4 hours as needed for Wheezing or Shortness of Breath   Shake well before each use.    1 Inhaler    5     . albuterol (PROVENTIL, VENTOLIN, PROAIR HFA) 108 (90 BASE) MCG/ACT inhaler    Inhalation    Inhale 2 puffs into the lungs every 4 hours as needed for Wheezing or Shortness of Breath   Shake well before each use.    1 Inhaler    5     . blood glucose monitor (FREESTYLE LITE) kit        Use as instructed    1 each    0     . FREESTYLE LITE test strip        Test 4 times daily as instructed.  ICD9 250.02; Diabetes mellitus, uncontrolled.    100 each    5     . lancets (FREESTYLE)        Use 4 times daily as instructed for blood glucose testing.  ICD9 250.02; Diabetes mellitus, uncontrolled.     100 each    5     . insulin lispro (HUMALOG KWIKPEN) 100 UNIT/ML injection pen    Subcutaneous    Inject 0-30 Units into the skin 4 times daily (before meals and nightly)   according to sliding scale.    36 mL    5     . insulin pen needle (BD ULTRA-FINE PEN NEEDLE MINI) 31G X 5 MM        Use 4 times daily as instructed with Humalog pen.  ICD-9 250.02  Diabetes mellitus, uncontrolled.    100 each    5     . insulin pen needle (BD ULTRA-FINE PEN NEEDLE MINI) 31G X 5 MM        Use nightly as instructed with Lantus pen.  ICD-9 250.02  Diabetes mellitus, uncontrolled.    100 each  5     . adjust bath/shower seat        Use as directed    1 each    0     . walker        Use as directed    1 each    0     . losartan-hydrochlorothiazide (HYZAAR) 100-25 MG per tablet    Oral    Take 1 tablet by mouth every morning    30 tablet    5     . rosuvastatin (CRESTOR) 40 MG tablet    Oral    Take 1 tablet (40 mg total) by mouth daily (with dinner)    30 tablet    5     . traZODone (DESYREL) 100 MG tablet        Take up to 3 tabs (300mg ) nightly as needed for sleep.    90 tablet    5     . Lubricants (K-Y LUBRICANT JELLY SENSITIVE) GEL        Use as directed    1 Tube    1     . sodium chloride (OCEAN) 0.65 % nasal spray    Each Nare    1 spray by Each Nare route as needed for Congestion    45 mL    1     . EXPIRED: clopidogrel (PLAVIX) 75 MG tablet    Oral    Take 1 tablet (75 mg total) by mouth daily    30 tablet    5     . EXPIRED: omeprazole (PRILOSEC) 40 MG capsule    Oral    Take 1 capsule (40 mg total) by mouth daily    30 capsule    5     . PEG 3350 packet                     . ibuprofen (ADVIL,MOTRIN) 600 MG tablet                     . budesonide-formoterol (SYMBICORT) 160-4.5 MCG/ACT inhaler    Inhalation    Inhale 2 puffs into the lungs 2 times daily    1 Inhaler    5     . dextrose (GLUTOSE) 40 % oral gel    Oral    Take 15 g by mouth once as needed for Low blood sugar    112.5 g    5     . starch (THICK-IT 2) powder  packet        Add to liquid or pureed foods for thickening as needed.    200 packet    5        Allergies: Keppra and Lisinopril     Medical History:   Past Medical History   Diagnosis Date   . Asthma    . COPD (chronic obstructive pulmonary disease)    . Coronary artery disease    . Diabetes mellitus    . Hypertension    . Ischemic Stroke 03/06/2006     Created by Conversion    . Prior Myocardial Infarction 03/19/2007     Created by Conversion    . Unspecified cerebral artery occlusion with cerebral infarction      2008   . Arthritis         Surgical History:   Past Surgical History   Procedure Laterality Date   . Coronary angioplasty with stent placement     .  Bunionectomy            Objective:     Base Eye Exam       Visual Acuity    Right Left   Dist sc 20/50-2 20/100-2   Dist ph sc NI 20/60-1   Near sc J10 J10   Method:  Snellen - Linear   Tonometry    Right Left   Pressure 16 16   Method: Applanation   Time: 3:32 PM   Pupils    Pupils React APD   Right PERRLA Brisk None   Left PERRLA Brisk None   Visual Fields    Left Right   Result Full Full   Extraocular Movement    Right Left   Result Full Full   Neuro/Psych   Oriented x3: Yes   Dilation   Both eyes: 2.5% Phenylephrine, 1.0% Tropicamide @ 3:32 PM      Slit Lamp and Fundus Exam       Slit Lamp Exam    Right Left   Lids/Lashes Normal structure & position Normal structure & position   Conjunctiva/Sclera Normal bulbar/palpebral, conjunctiva, sclera Normal bulbar/palpebral, conjunctiva, sclera   Cornea low tear film low tear film   Anterior Chamber Clear & deep Clear & deep   Iris Normal shape, size, morphology, No neovascularization Normal shape, size, morphology, No neovascularization   Lens Trace Nuclear sclerosis Trace Nuclear sclerosis   Vitreous Clear, no heme Clear, no heme   Fundus Exam    Right Left   Disc Normal size, appearance, nerve fiber layer, No neovascularization Normal size, appearance, nerve fiber layer, No neovascularization   C/D Ratio 0.35  0.35   Macula rare MA, few exudate along STA, no edema rare MA, no edema/exudate   Vessels Normal, no VB Normal, no VB   Periphery Normal, no NVE Normal, no NVE      Refraction       Manifest Refraction    Sphere Cylinder Axis Dist Add Near   Right -0.50 +0.25 060 20/20-1 +3.00 J1   Left -1.00 sphere  20/20-1 +3.00 J1   Final Rx    Sphere Cylinder Axis Add   Right -0.50 +0.25 060 +3.00   Left -1.00 sphere  +3.00         Final Rx    Sphere Cylinder Axis Add   Right -0.50 +0.25 060 +3.00   Left -1.00 sphere  +3.00              No annotated images are attached to the encounter.      Assessment/Plan:     Type II, uncontrolled  Diabetes mellitus type 2:  Insulin-dependent  Mild non-proliferative diabetic retinopathy OU, no macular edema  BP/BG/lipid control, goal A1c < 7.0 or as directed by PCP  Next dilated exam in 9-12 months    Hypertension:  No hypertensive retinopathy  BP/BG/lipid control as above    Presbyopia:  Patient refracted and MRx dispensed    Dry eye:  AT OU QID or PRN    Patient seen/examined with Dr. Lilyan Punt.       I have seen and examined the patient and agree with residents assessment and plan as documented above.    Aura Dials, MD

## 2012-10-09 NOTE — Assessment & Plan Note (Signed)
Diabetes mellitus type 2:  Insulin-dependent  Mild non-proliferative diabetic retinopathy OU, no macular edema  BP/BG/lipid control, goal A1c < 7.0 or as directed by PCP  Next dilated exam in 9-12 months    Hypertension:  No hypertensive retinopathy  BP/BG/lipid control as above    Presbyopia:  Patient refracted and MRx dispensed    Dry eye:  AT OU QID or PRN    Patient seen/examined with Dr. Lilyan Punt.

## 2012-10-10 ENCOUNTER — Telehealth: Payer: Self-pay | Admitting: Family Medicine

## 2012-10-10 ENCOUNTER — Other Ambulatory Visit: Payer: Self-pay | Admitting: Family Medicine

## 2012-10-10 ENCOUNTER — Encounter: Payer: Self-pay | Admitting: Gastroenterology

## 2012-10-10 NOTE — Telephone Encounter (Signed)
[facility] called stating that patient will be discharged due to expired insurance. If you can please send PCA orders to [facility]

## 2012-10-11 ENCOUNTER — Telehealth: Payer: Self-pay

## 2012-10-11 NOTE — Telephone Encounter (Signed)
10/11/12 Returned call to Pt. Pt states that her insurance has cancelled her home care with [facility]. This Clinical research associate called [facility] nurse Melissa [phone]). Per Melissa - [facility] states Pt's CHS, COPD, DM, and hypertension are now under control. [facility] had to discontinue weekly nurse service, telehealth service, and home health aide service on Tuesday 8/5. [facility] has PCA? aide service available for Pt but needs orders from her Provider. These orders can be faxed to [employer] at [phone]. Thank you.

## 2012-10-12 ENCOUNTER — Other Ambulatory Visit: Payer: Self-pay | Admitting: Family Medicine

## 2012-10-12 DIAGNOSIS — M1710 Unilateral primary osteoarthritis, unspecified knee: Secondary | ICD-10-CM

## 2012-10-12 DIAGNOSIS — J449 Chronic obstructive pulmonary disease, unspecified: Secondary | ICD-10-CM

## 2012-10-12 DIAGNOSIS — IMO0001 Reserved for inherently not codable concepts without codable children: Secondary | ICD-10-CM

## 2012-10-12 DIAGNOSIS — I10 Essential (primary) hypertension: Secondary | ICD-10-CM

## 2012-10-12 NOTE — Telephone Encounter (Signed)
Referral for Personal Care Assistant ordered.  Contact [employer] at [phone]    [name] MD, PhD  Family Medicine, R3  10/12/2012 8:31 AM

## 2012-10-14 NOTE — Telephone Encounter (Signed)
Paperwork faxed °

## 2012-10-15 ENCOUNTER — Encounter: Payer: Self-pay | Admitting: Gastroenterology

## 2012-10-15 ENCOUNTER — Other Ambulatory Visit: Payer: Self-pay | Admitting: Family Medicine

## 2012-10-21 ENCOUNTER — Other Ambulatory Visit: Payer: Self-pay | Admitting: Family Medicine

## 2012-10-22 ENCOUNTER — Ambulatory Visit: Payer: Self-pay | Admitting: Family Medicine

## 2012-10-22 MED ORDER — METOPROLOL SUCCINATE 100 MG PO TB24 *I*
100.0000 mg | ORAL_TABLET | Freq: Every day | ORAL | Status: DC
Start: 2012-10-22 — End: 2012-12-11

## 2012-10-22 MED ORDER — GABAPENTIN 600 MG PO TABS
600.0000 mg | ORAL_TABLET | Freq: Every evening | ORAL | Status: DC
Start: 2012-10-22 — End: 2012-12-05

## 2012-10-28 ENCOUNTER — Encounter: Payer: Self-pay | Admitting: Gastroenterology

## 2012-10-29 ENCOUNTER — Other Ambulatory Visit: Payer: Self-pay | Admitting: Family Medicine

## 2012-10-31 ENCOUNTER — Ambulatory Visit: Payer: Self-pay | Admitting: Family Medicine

## 2012-10-31 ENCOUNTER — Encounter: Payer: Self-pay | Admitting: Family Medicine

## 2012-10-31 VITALS — BP 132/74 | HR 81 | Ht 64.17 in | Wt <= 1120 oz

## 2012-10-31 DIAGNOSIS — IMO0001 Reserved for inherently not codable concepts without codable children: Secondary | ICD-10-CM

## 2012-10-31 DIAGNOSIS — M1710 Unilateral primary osteoarthritis, unspecified knee: Secondary | ICD-10-CM

## 2012-10-31 DIAGNOSIS — I10 Essential (primary) hypertension: Secondary | ICD-10-CM

## 2012-10-31 MED ORDER — MORPHINE SULFATE ER 15 MG PO TBCR *I*
15.0000 mg | ORAL_TABLET | Freq: Two times a day (BID) | ORAL | Status: DC | PRN
Start: 2012-10-31 — End: 2012-11-21

## 2012-10-31 MED ORDER — ACETAMINOPHEN 500 MG PO TABS *I*
1000.0000 mg | ORAL_TABLET | Freq: Three times a day (TID) | ORAL | Status: DC | PRN
Start: 2012-10-31 — End: 2013-04-07

## 2012-10-31 MED ORDER — MELOXICAM 15 MG PO TABS *I*
15.0000 mg | ORAL_TABLET | Freq: Every day | ORAL | Status: DC
Start: 2012-10-31 — End: 2013-02-18

## 2012-10-31 NOTE — Progress Notes (Addendum)
Assessment & Plan        Gloria Ray is a 57 y.o. female and  has a past medical history of Asthma; COPD (chronic obstructive pulmonary disease); Coronary artery disease; Diabetes mellitus; Hypertension; Ischemic Stroke (03/06/2006); Prior Myocardial Infarction (03/19/2007); Unspecified cerebral artery occlusion with cerebral infarction; and Arthritis.       The patient evaluated today for: Knee Pain, Follow-up, Diabetes and Medication Refill      DJD (degenerative joint disease) of knee, bilateral  - acetaminophen (ACETAMINOPHEN EXTRA STRENGTH) 500 mg tablet; Take 2 tablets (1,000 mg total) by mouth 3 times daily as needed   FOR PAIN. MAX/DAY=6 TABLETS. DECREASE BY 1 TABLET FOR EVERY LORTAB  - meloxicam (MOBIC) 15 MG tablet; Take 1 tablet (15 mg total) by mouth daily   Take with food.  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  Diabetes health maintenance checklist reviewed  - Hemoglobin A1c; Future  - Hepatitis B & C profile; Future  - Lipid panel; Future  - Hepatitis B vaccine adult IM  (AMBULATORY USE ONLY) ordered, will receive at next visit, needed to leave  - Microalbumin, Urine, Random; Future    Hypertension.  Well controlled with good adherence.  -     Continue current medications at current doses    Routine Adult Health Maintenance  - HIV 1&2 antigen/antibody; Future         Subjective                      Chief Complaint: Knee Pain, Follow-up, Diabetes and Medication Refill    HPI  DJD (degenerative joint disease) of knee, bilateral       Requesting paperwork to qualify for scooter/wheelchair.  Is considering knee injections or replacement again as unable to perform ADL due to knee pain.  Scooter is mainly to get to store to grocery shop, does not have reliable helper to perform this, cannot afford  to keep paying someone at this time.  Requesting aid and nursing services be reinstated, needs further insurance approval which they have currently denied ([employer]).    Type II, uncontrolled       Patient reports taking insulin as prescribed 7/7 days last week and missing some times last month.  Denies adverse medication effects at this time, notes 0 episodes of hypoglycemia.  Is checking blood glucose outside of the office, but needs nursing help.  Denies headache, vision changes, excessive thirst, increased urination, nausea, vomiting, chest pain, palpitations, SOB, dizziness, loss of consciousness or loss of motor function.        Hypertension  Current Outpatient Prescriptions on File Prior to Visit   Medication Sig Dispense Refill   . metoprolol (TOPROL-XL) 100 MG 24 hr tablet Take 1 tablet (100 mg total) by mouth daily   Do not crush or chew. May be divided.  30 tablet  5   . amLODIPine (NORVASC) 10 MG tablet Take 1 tablet (10 mg total) by mouth daily  90 tablet  3   . losartan-hydrochlorothiazide (HYZAAR) 100-25 MG per tablet Take 1 tablet by mouth every morning  30 tablet  5        Patient reports taking medications as prescribed 7/7 days last week and missing  many times last month.  Denies adverse medication effects at this time.  Not checking blood pressures outside of the office.  Denies headache, vision changes,  chest pain, palpitations, SOB, dizziness, loss of consciousness or loss of motor function.      History       Reviewed and updated where appropriate based on HPI and patient report.      ROS       Pertinent positives and negatives of a 12-point review of systems noted above.       Objective   Recorded Vital Signs:             BP 132/74  Pulse 81  Ht 1.63 m (5' 4.17")                            Physical Exam   VITALS: Reviewed above and WNL.   GEN: Well developed, well nourished, appears stated age, no acute distress  HEENT: Atraumatic.  EOM normal, conjunctiva clear, no blepharitis.  Moist  mucus membranes, no oropharyngeal lesions.  Trachea midline, no lymphadenopathy or thyromegaly.  RESP: Unlabored breaths.  Lungs are clear to auscultation bilaterally with good effort.  No crackles, rales, or wheezes appreciated.  CV: Regular rhythm, S1, S2.  No S3, S4, murmurs or rubs.   ABD: Bowel sounds normal.  Soft, non-tender, non-distended.   EXT: Warm and well perfused.  No edema.   SKIN: Warm and dry. No rashes, ecchymoses, or petechiae of exposed surfaces  NEURO:  Alert.  Cranial nerves grossly intact.  Strength is normal and symmetric.  Gait is normal.  Balance is normal.    PSYCH:  Patient well dressed and well groomed, sitting in chair.  Speech is normal.  Mood is depressed, anxious.  Affect is congruent with mood.  Thought content is linear with no expressed paranoia, delusions, or hallucinations.  Judgement is normal.  Insight is normal.     Labs  Reviewed:  Lab Results   Component Value Date    HA1C 12.2* 06/26/2012    HA1C 10.7* 03/17/2012    HA1C 8.3* 05/29/2011    MALBR 47.41 03/17/2012    CREAT 1.02* 08/21/2012    LDLC 199 06/26/2012       BP Readings from Last 3 Encounters:   10/31/12 132/74   09/27/12 151/87   08/21/12 122/66        Immunization History   Administered Date(s) Administered   . Influenza Adult(3yr and up) 03/29/2011, 12/01/2011   . PPD Test 03/17/2012, 04/24/2012   . Pneumococcal Polysaccharide (Pneumovax) 03/29/2011          Note in APSO format, see top for Assessment & Plan.     [name] MD, PhD   [facility]   10/31/2012  8:51 AM     __________________________________________________________________________________    10/31/2012 10:04 AM    Preceptor Attestation - [facility]   Preceptor: Esmeralda Arthur, MD    I reviewed, discussed, and independently saw the patient at the time of the visit. I confirmed significant history, exam findings, and plan of care with [name], MD.  I agree with the findings and plan of care as documented above  with additions documented below.    Exam:  GENERAL:  Pleasant obese female walking with walker, NAD, A&Ox3.       Esmeralda Arthur, MD  Attending, [facility]

## 2012-11-05 ENCOUNTER — Telehealth: Payer: Self-pay

## 2012-11-05 NOTE — Telephone Encounter (Signed)
Amea calling in asking if you have seen or completed paperwork for [facility] Wheelchair for her.

## 2012-11-08 ENCOUNTER — Telehealth: Payer: Self-pay

## 2012-11-08 NOTE — Telephone Encounter (Signed)
11/08/12 Returned Pt call. Pt states that her legs and feet are swollen and ache. Pt is resting and elevating her feet at present. Pt is asking about paperwork concerning her scooter request from [employer]	[place] and request for home health aide and nursing service. Pt states she has discussed these items with her Provider. Please advise 800 nurse. Thank you.

## 2012-11-11 ENCOUNTER — Other Ambulatory Visit: Payer: Self-pay | Admitting: Family Medicine

## 2012-11-11 NOTE — Assessment & Plan Note (Addendum)
S/O:  Current Outpatient Prescriptions on File Prior to Visit   Medication Sig Dispense Refill   . metoprolol (TOPROL-XL) 100 MG 24 hr tablet Take 1 tablet (100 mg total) by mouth daily   Do not crush or chew. May be divided.  30 tablet  5   . amLODIPine (NORVASC) 10 MG tablet Take 1 tablet (10 mg total) by mouth daily  90 tablet  3   . losartan-hydrochlorothiazide (HYZAAR) 100-25 MG per tablet Take 1 tablet by mouth every morning  30 tablet  5     Patient reports taking medications as prescribed 7/7 days last week and missing many times last month.  Denies adverse medication effects at this time.  Not checking blood pressures outside of the office.  Denies headache, vision changes, chest pain, palpitations, SOB, dizziness, loss of consciousness or loss of motor function.    A/P:       Well controlled with good adherence.  -     Continue current medications at current doses

## 2012-11-12 ENCOUNTER — Telehealth: Payer: Self-pay

## 2012-11-12 ENCOUNTER — Encounter: Payer: Self-pay | Admitting: Family Medicine

## 2012-11-12 NOTE — Telephone Encounter (Signed)
Patient calling in stated that you still need to send information to [facility], and also to [employer] for her to get home care. Unclear what was needed , and we cannot send anything without written permission from patient to do so. She is asking to have you call her at [phone] to discuss.

## 2012-11-13 ENCOUNTER — Ambulatory Visit: Payer: Self-pay | Admitting: Family Medicine

## 2012-11-13 ENCOUNTER — Telehealth: Payer: Self-pay

## 2012-11-13 VITALS — Wt 242.0 lb

## 2012-11-13 DIAGNOSIS — IMO0001 Reserved for inherently not codable concepts without codable children: Secondary | ICD-10-CM

## 2012-11-13 LAB — LIPID PANEL
Chol/HDL Ratio: 4.8
Cholesterol: 266 mg/dL — AB
HDL: 55 mg/dL
LDL Calculated: 169 mg/dL
Non HDL Cholesterol: 211 mg/dL
Triglycerides: 212 mg/dL — AB

## 2012-11-13 NOTE — Progress Notes (Signed)
Assessment & Plan        Gloria Ray is a 57 y.o. female and  has a past medical history of Asthma; COPD (chronic obstructive pulmonary disease); Coronary artery disease; Diabetes mellitus; Hypertension; Ischemic Stroke (03/06/2006); Prior Myocardial Infarction (03/19/2007); Unspecified cerebral artery occlusion with cerebral infarction; and Arthritis.       The patient evaluated today for: Leg Swelling and Immunizations (HepB, review Diabetes HM)      Type II, uncontrolled  - Hepatitis B vaccine adult IM  (AMBULATORY USE ONLY)  - HIV 1&2 antigen/antibody  - Hemoglobin A1c  - Hepatitis B & C profile  - Lipid panel    Leg Swelling.  Dependent edema due to increased sodium load, L side baker's cyst.   Educated and reassured     Introduced patient to care manage to help manage chronic diseases         Subjective                      Chief Complaint: Leg Swelling and Immunizations (HepB, review Diabetes HM)  HPI  Diabetes       Here for HepB vaccine.       Patient reports taking insulin as prescribed 7/7 days last week and missing numerous times last month.   Needs help from nursing and aid services but insurance has denied.  Denies headache, vision changes, excessive thirst, increased urination, nausea, vomiting, chest pain, palpitations, SOB, dizziness, loss of consciousness or loss of motor function.     Leg Swelling       Heard from friends that leg swelling can mean heart is causing fluid to accumulate.  Educated that she does not have clinical signs or diagnosis of heart failure though nurses and friends worry about this due to baseline SOB.  Her SOB is caused by COPD, which she is treating successfully.  Dependent edema is more related to salt intake for her, and she admits to eating high sodium foods recently.        History       Reviewed and updated where appropriate based on HPI and patient report.      ROS       Pertinent positives and negatives of a focused review of systems noted above.        Objective   Recorded Vital Signs:             Wt 109.77 kg (242 lb)  BMI 41.32 kg/m2               Wt Readings from Last 7 Encounters:   11/13/12 109.77 kg (242 lb)   10/31/12 0.045 kg (1.6 oz)   09/27/12 108.863 kg (240 lb)   08/21/12 102.967 kg (227 lb)   08/12/12 110.678 kg (244 lb)   08/05/12 110.768 kg (244 lb 3.2 oz)   07/23/12 104.327 kg (230 lb)                   Physical Exam   VITALS: Reviewed above and WNL.  BMI category is morbid obesity.  GEN: Well developed, well nourished, appears stated age, no acute distress  HEENT: Atraumatic.  EOM normal, conjunctiva clear, no blepharitis.  Moist mucus membranes, no oropharyngeal lesions.  Trachea midline, no lymphadenopathy or thyromegaly.  RESP: Baseline breathlessness.  Lungs are clear to auscultation bilaterally with good effort.  No crackles, rales, or wheezes appreciated.  CV: Regular rhythm, S1, S2.  No S3, S4, murmurs or  rubs.   EXT: Warm and well perfused.  Mild pre-tibial edema.  Baker's cyst behind L knee  SKIN: Warm and dry. No rashes, ecchymoses, or petechiae of exposed surfaces       Note in APSO format, see top for Assessment & Plan.     [name] MD, PhD   [facility]   11/13/2012  2:46 PM       Family Medicine Attestation    I have reviewed the history, exam findings, and plan of care and discussed the care plan with the resident at the time of the visit. I agree with the resident's findings and plan of care as documented above.    Silvio Pate, MD  Family Medicine Attending Physician    Signed at 3:04 PM on 11/13/2012.

## 2012-11-13 NOTE — Telephone Encounter (Signed)
Left foot and LE has +1 pitting and right LE also has +1 pitting edema. Pt unable to wear shoes as the pain is so bad when she walks tht she can't take it. Pt advised that no appts avail at [facility] at this time, and if she feels she needs immediate tx then Urgent care/After hours or ED would be advisable.

## 2012-11-13 NOTE — Telephone Encounter (Signed)
Pt is out of medication

## 2012-11-13 NOTE — Telephone Encounter (Signed)
Pt experiencing edema X1 week from thighs down to feet and it is very painful. Pt says they look red to her and the morphine and meloxicam are not taking away the pain. She's been elevating them as much as she can and it's still very painful. Is there anything else she can take for the pain?  Pt is coming in with a friend for a 3PM appt 9/10 and is asking for the RN to assess them to see if they can wait til 9/15 appt, or pt should go to ED.Please advise [id number] so pt can be notified re: pain med. Thank You!!!

## 2012-11-14 LAB — HEPATITIS B & C PROF
HBV Core Ab: NEGATIVE
HBV S Ab Quant: 0.3 m[IU]/mL
HBV S Ab: NEGATIVE
HBV S Ag: NEGATIVE
Hep C Ab: NEGATIVE

## 2012-11-14 LAB — HEMOGLOBIN A1C: Hemoglobin A1C: 11.4 % — ABNORMAL HIGH (ref 4.0–6.0)

## 2012-11-14 LAB — HIV 1&2 ANTIGEN/ANTIBODY: HIV 1&2 ANTIGEN/ANTIBODY: NONREACTIVE

## 2012-11-16 NOTE — Progress Notes (Signed)
Quick Note:    A: Abnormal results noted, non-urgent.  P:   - May discuss normal and abnormal values at next visit   - May call/MyChart message MD with questions/concerns  ______

## 2012-11-18 ENCOUNTER — Ambulatory Visit: Payer: Self-pay | Admitting: Family Medicine

## 2012-11-18 ENCOUNTER — Encounter: Payer: Self-pay | Admitting: Family Medicine

## 2012-11-21 ENCOUNTER — Other Ambulatory Visit: Payer: Self-pay | Admitting: Family Medicine

## 2012-11-21 ENCOUNTER — Encounter: Payer: Self-pay | Admitting: Gastroenterology

## 2012-11-21 DIAGNOSIS — M1710 Unilateral primary osteoarthritis, unspecified knee: Secondary | ICD-10-CM

## 2012-11-21 MED ORDER — MORPHINE SULFATE ER 15 MG PO TBCR *I*
15.0000 mg | ORAL_TABLET | Freq: Two times a day (BID) | ORAL | Status: DC | PRN
Start: 2012-11-21 — End: 2012-12-13

## 2012-11-26 ENCOUNTER — Other Ambulatory Visit: Payer: Self-pay | Admitting: Family Medicine

## 2012-11-28 MED ORDER — ROSUVASTATIN CALCIUM 40 MG PO TABS *I*
40.0000 mg | ORAL_TABLET | Freq: Every day | ORAL | Status: DC
Start: 2012-11-26 — End: 2012-12-13

## 2012-11-28 NOTE — Progress Notes (Signed)
11/28/12 - Put prior authorization form for Crestor 40 mg tablet in Dr. Wallie Renshaw box for completion

## 2012-12-04 ENCOUNTER — Ambulatory Visit
Admit: 2012-12-04 | Discharge: 2013-01-03 | Disposition: A | Discharge status: Home / Self Care | Payer: Self-pay | Source: Ambulatory Visit | Attending: Psychiatry | Admitting: Psychiatry

## 2012-12-04 NOTE — Progress Notes (Signed)
12/04/12 - Sent a message to Dr. Juliane Poot to follow up on the status of the prior authorization.

## 2012-12-05 ENCOUNTER — Other Ambulatory Visit: Payer: Self-pay | Admitting: Family Medicine

## 2012-12-08 ENCOUNTER — Encounter: Payer: Self-pay | Admitting: Emergency Medicine

## 2012-12-08 ENCOUNTER — Inpatient Hospital Stay
Admit: 2012-12-08 | Disposition: A | Discharge status: Home Health | Payer: Self-pay | Source: Ambulatory Visit | Attending: Family Medicine | Admitting: Family Medicine

## 2012-12-08 ENCOUNTER — Other Ambulatory Visit: Payer: Self-pay | Admitting: Gastroenterology

## 2012-12-08 LAB — BASIC METABOLIC PANEL
Anion Gap: 22 — ABNORMAL HIGH (ref 7–16)
CO2: 19 mmol/L — ABNORMAL LOW (ref 20–28)
Calcium: 8.8 mg/dL (ref 8.6–10.2)
Chloride: 92 mmol/L — ABNORMAL LOW (ref 96–108)
Creatinine: 3.7 mg/dL — ABNORMAL HIGH (ref 0.51–0.95)
GFR,Black: 15 * — AB
GFR,Caucasian: 13 * — AB
Glucose: 160 mg/dL — ABNORMAL HIGH (ref 60–99)
Lab: 49 mg/dL — ABNORMAL HIGH (ref 6–20)
Potassium: 3.8 mmol/L (ref 3.3–5.1)
Sodium: 133 mmol/L (ref 133–145)

## 2012-12-08 LAB — CBC AND DIFFERENTIAL
Baso # K/uL: 0 10*3/uL (ref 0.0–0.1)
Basophil %: 0.2 % (ref 0.1–1.2)
Eos # K/uL: 0.1 10*3/uL (ref 0.0–0.4)
Eosinophil %: 0.6 % — ABNORMAL LOW (ref 0.7–5.8)
Hematocrit: 37 % (ref 34–45)
Hemoglobin: 11.9 g/dL (ref 11.2–15.7)
Lymph # K/uL: 3.7 10*3/uL (ref 1.2–3.7)
Lymphocyte %: 43.3 % (ref 19.3–51.7)
MCV: 82 fL (ref 79–95)
Mono # K/uL: 0.6 10*3/uL (ref 0.2–0.9)
Monocyte %: 6.7 % (ref 4.7–12.5)
Neut # K/uL: 4.2 10*3/uL (ref 1.6–6.1)
Platelets: 274 10*3/uL (ref 160–370)
RBC: 4.6 MIL/uL (ref 3.9–5.2)
RDW: 16.2 % — ABNORMAL HIGH (ref 11.7–14.4)
Seg Neut %: 49 % (ref 34.0–71.1)
WBC: 8.6 10*3/uL (ref 4.0–10.0)

## 2012-12-08 LAB — CK ISOENZYMES
CK: 509 U/L — ABNORMAL HIGH (ref 34–145)
Mass CKMB: 5.8 ng/mL — ABNORMAL HIGH (ref 0.0–2.9)
Relative Index: 1.1 % (ref 0.0–5.0)

## 2012-12-08 LAB — ETHANOL: Ethanol: <10 mg/dL

## 2012-12-08 LAB — POCT GLUCOSE: Glucose POCT: 168 mg/dL — ABNORMAL HIGH (ref 60–99)

## 2012-12-08 MED ORDER — ALBUTEROL SULFATE (2.5 MG/3ML) 0.083% IN NEBU *I*
7.5000 mg | INHALATION_SOLUTION | RESPIRATORY_TRACT | Status: AC
Start: 2012-12-08 — End: 2012-12-08
  Administered 2012-12-08: 7.5 mg via RESPIRATORY_TRACT
  Filled 2012-12-08: qty 9

## 2012-12-08 MED ORDER — IPRATROPIUM BROMIDE 0.02 % IN SOLN *I*
500.0000 ug | RESPIRATORY_TRACT | Status: AC
Start: 2012-12-08 — End: 2012-12-08
  Administered 2012-12-08: 500 ug via RESPIRATORY_TRACT
  Filled 2012-12-08: qty 2.5

## 2012-12-08 MED ORDER — SODIUM CHLORIDE 0.9 % IV SOLN WRAPPED *I*
125.0000 mL/h | Status: DC
Start: 2012-12-08 — End: 2012-12-09
  Administered 2012-12-08: 999 mL/h via INTRAVENOUS

## 2012-12-08 MED ORDER — SODIUM CHLORIDE 0.9 % IV SOLN WRAPPED *I*
2000.0000 mL | Freq: Once | Status: AC
Start: 2012-12-08 — End: 2012-12-08
  Administered 2012-12-08: 2000 mL via INTRAVENOUS

## 2012-12-08 MED ORDER — KETOROLAC TROMETHAMINE 30 MG/ML IJ SOLN *I*
60.0000 mg | Freq: Once | INTRAMUSCULAR | Status: AC
Start: 2012-12-08 — End: 2012-12-08
  Administered 2012-12-08: 60 mg via INTRAMUSCULAR
  Filled 2012-12-08: qty 2

## 2012-12-08 NOTE — ED Notes (Signed)
Pt here with bilateral leg pain which started 1 hour pta. Pt also has h/o asthma with some wheezing heard on auscultation.

## 2012-12-08 NOTE — ED Provider Notes (Signed)
History     Chief Complaint   Patient presents with   . Leg Pain     pt had suddne onset in bilateral leg pain while ambulating today. Pt states the leg pain pain radiated into her chest, that has resloved. pt took her own asa.    . Asthma     pt c/o asthma exacerbation with wheezing respirations at baseline. ems gave nebulizer     Patient is a 57 y.o. female presenting with shortness of breath.   History provided by:  Patient and medical records  Shortness of Breath  Severity:  Moderate  Onset quality:  Sudden  Timing:  Constant  Associated symptoms: wheezing    Associated symptoms: no chest pain and no fever    complained of sudden onset wheezing and BL lower extremity pain    Past Medical History   Diagnosis Date   . Asthma    . COPD (chronic obstructive pulmonary disease)    . Coronary artery disease    . Diabetes mellitus    . Hypertension    . Ischemic Stroke 03/06/2006     Created by Conversion    . Prior Myocardial Infarction 03/19/2007     Created by Conversion    . Unspecified cerebral artery occlusion with cerebral infarction      2008   . Arthritis             Past Surgical History   Procedure Laterality Date   . Coronary angioplasty with stent placement     . Bunionectomy         Family History   Problem Relation Age of Onset   . Diabetes Other    . Heart disease Other    . Hypertension Other    . Coronary art dis Other    . Hypertension Mother    . Diabetes Mother    . Alcohol abuse Father    . Other Father      cirrhosis   . Arthritis Sister    . Substance abuse Sister    . Other Brother      HIV   . Substance abuse Brother    . Diabetes Brother          Social History      reports that she quit smoking about 2 years ago. Her smoking use included Cigarettes. She smoked 0.00 packs per day. She has never used smokeless tobacco. She reports that she does not drink alcohol or use illicit drugs. Her sexual activity history is not on file.    Living Situation    Questions Responses    Patient lives with  Alone    Homeless No    Caregiver for other family member No    External Services Home Care Services    Comment:  Visiting nurse     Employment Retired    Domestic Violence Risk No          Problem List     Patient Active Problem List   Diagnosis Code   . Coronary Artery Disease 414.00   . Hypertension 401.9   . Asthma 493.90   . Type II, uncontrolled 250.02   . COPD (chronic obstructive pulmonary disease) 496   . Dental caries 521.00   . Anxiety and depression 300.4   . Insomnia 780.52   . Pain, dental 525.9   . Bilateral presbyopia 367.4   . 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 Controlled substance  agreement signed V58.69   . Chest heaviness 786.59   . 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 UTI (lower urinary tract infection) 599.0       Review of Systems   Review of Systems   Constitutional: Negative for fever.   Respiratory: Positive for shortness of breath and wheezing.    Cardiovascular: Negative for chest pain.   Musculoskeletal: Positive for myalgias. Negative for back pain, joint swelling and gait problem.   Neurological: Positive for weakness. Negative for syncope and numbness.   All other systems reviewed and are negative.        Physical Exam     ED Triage Vitals   BP Heart Rate Heart Rate(via Pulse Ox) Resp Temp Temp src SpO2 O2 Device O2 Flow Rate   12/08/12 1800 12/08/12 1800 -- 12/08/12 1800 12/08/12 1800 -- 12/08/12 1800 12/08/12 1800 --   141/101 mmHg 85  18 36 C (96.8 F)  96 % None (Room air)       Weight           12/08/12 1800           99.791 kg (220 lb)               Physical Exam   Constitutional: She is oriented to person, place, and time. No distress.   HENT:   Head: Normocephalic and atraumatic.   Eyes: No scleral icterus.   Neck: Normal range of motion. Neck supple.   Cardiovascular: Normal rate.    Pulmonary/Chest: No respiratory distress. She has wheezes.   Abdominal: Soft. She exhibits no distension. There is no tenderness. There is no rebound.   Musculoskeletal: She  exhibits no edema and no tenderness.   Neurological: She is alert and oriented to person, place, and time.   Skin: Skin is warm and dry. No rash noted. She is not diaphoretic.   Psychiatric: Her mood appears anxious.       Medical Decision Making      Amount and/or Complexity of Data Reviewed  Clinical lab tests: ordered  Tests in the radiology section of CPT: ordered and reviewed        Initial Evaluation:  ED First Provider Contact    Date/Time Event User Comments    12/08/12 1807 ED Provider First Contact WEIR, PATRICIA A Initial Face to Face Provider Contact          Patient seen by me as above    Assessment:  57 y.o., female comes to the ED with wheezing and leg pain; subsequently, she became hypotensive    Differential Diagnosis includes RAD, electrolyte derangement, aki, dvt, acs             Plan: ecg, [place] of leg, nebs  After [place], pt was acting confused and was noted to be hypotensive  IVF bolus ordered, FSBG  Labs sent - show AKI      Peterson Lombard, MD          Peterson Lombard, MD  12/08/12 2137

## 2012-12-08 NOTE — ED Notes (Signed)
Pt requesting a house phone and boxed lunch, writer found a bottle of Trazadone on the floor in the room. Pt admits to taking med just PTA to ED.

## 2012-12-08 NOTE — ED Provider Progress Notes (Signed)
ED Provider Progress Note  Pt with AKI  BP improved           Gloria Denapoli, MD, 12/08/2012, 10:21 PM

## 2012-12-08 NOTE — ED Notes (Signed)
ED provider approached writer to inform that this pt seemed increasingly lethargic. Writer entered room to find pt with change in mental status. Pt awake to voice and follows commands. Pt CAO x 3.     EKG obtained, labs drawn and sent per order. Pt found to be hypotensive. ED provider made aware.

## 2012-12-08 NOTE — ED Notes (Addendum)
Pt off to xray

## 2012-12-09 LAB — URINE MICROSCOPIC (IQ200): RBC,UA: NONE SEEN /hpf (ref 0–2)

## 2012-12-09 LAB — CBC AND DIFFERENTIAL
Baso # K/uL: 0 10*3/uL (ref 0.0–0.1)
Baso # K/uL: 0 10*3/uL (ref 0.0–0.1)
Basophil %: 0.2 % (ref 0.1–1.2)
Basophil %: 0.5 % (ref 0.1–1.2)
Eos # K/uL: 0 10*3/uL (ref 0.0–0.4)
Eos # K/uL: 0.1 10*3/uL (ref 0.0–0.4)
Eosinophil %: 0.7 % (ref 0.7–5.8)
Eosinophil %: 1.2 % (ref 0.7–5.8)
Hematocrit: 31 % — ABNORMAL LOW (ref 34–45)
Hematocrit: 32 % — ABNORMAL LOW (ref 34–45)
Hemoglobin: 9.8 g/dL — ABNORMAL LOW (ref 11.2–15.7)
Hemoglobin: 10 g/dL — ABNORMAL LOW (ref 11.2–15.7)
Lymph # K/uL: 2.3 10*3/uL (ref 1.2–3.7)
Lymph # K/uL: 2 10*3/uL (ref 1.2–3.7)
Lymphocyte %: 39 % (ref 19.3–51.7)
Lymphocyte %: 47 % (ref 19.3–51.7)
MCV: 82 fL (ref 79–95)
MCV: 82 fL (ref 79–95)
Mono # K/uL: 0.6 10*3/uL (ref 0.2–0.9)
Mono # K/uL: 0.3 10*3/uL (ref 0.2–0.9)
Monocyte %: 10.5 % (ref 4.7–12.5)
Monocyte %: 7.9 % (ref 4.7–12.5)
Neut # K/uL: 2.9 10*3/uL (ref 1.6–6.1)
Neut # K/uL: 1.8 10*3/uL (ref 1.6–6.1)
Platelets: 216 10*3/uL (ref 160–370)
Platelets: 206 10*3/uL (ref 160–370)
RBC: 3.8 MIL/uL — ABNORMAL LOW (ref 3.9–5.2)
RBC: 3.9 MIL/uL (ref 3.9–5.2)
RDW: 16.2 % — ABNORMAL HIGH (ref 11.7–14.4)
RDW: 16.3 % — ABNORMAL HIGH (ref 11.7–14.4)
Seg Neut %: 49.4 % (ref 34.0–71.1)
Seg Neut %: 42.9 % (ref 34.0–71.1)
WBC: 5.8 10*3/uL (ref 4.0–10.0)
WBC: 4.3 10*3/uL (ref 4.0–10.0)

## 2012-12-09 LAB — URINALYSIS REFLEX TO CULTURE
Blood,UA: NEGATIVE
Glucose,UA: 300 mg/dL — AB
Glucose,UA: 1000 mg/dL — AB
Ketones, UA: NEGATIVE
Nitrite,UA: NEGATIVE
Nitrite,UA: POSITIVE — AB
Protein,UA: 25 mg/dL — AB
Protein,UA: NEGATIVE mg/dL
Specific Gravity,UA: 1.021 (ref 1.001–1.030)
Specific Gravity,UA: 1.018 (ref 1.001–1.030)
pH,UA: 5 (ref 5.0–8.0)
pH,UA: 5 (ref 5.0–8.0)

## 2012-12-09 LAB — PHOSPHORUS: Phosphorus: 4.5 mg/dL (ref 2.7–4.5)

## 2012-12-09 LAB — BASIC METABOLIC PANEL
Anion Gap: 15 (ref 7–16)
Anion Gap: 11 (ref 7–16)
CO2: 20 mmol/L (ref 20–28)
CO2: 20 mmol/L (ref 20–28)
Calcium: 7.6 mg/dL — ABNORMAL LOW (ref 8.6–10.2)
Calcium: 7.7 mg/dL — ABNORMAL LOW (ref 8.6–10.2)
Chloride: 96 mmol/L (ref 96–108)
Chloride: 104 mmol/L (ref 96–108)
Creatinine: 3 mg/dL — ABNORMAL HIGH (ref 0.51–0.95)
Creatinine: 1.78 mg/dL — ABNORMAL HIGH (ref 0.51–0.95)
GFR,Black: 19 * — AB
GFR,Black: 36 * — AB
GFR,Caucasian: 17 * — AB
GFR,Caucasian: 31 * — AB
Glucose: 419 mg/dL — ABNORMAL HIGH (ref 60–99)
Glucose: 345 mg/dL — ABNORMAL HIGH (ref 60–99)
Lab: 47 mg/dL — ABNORMAL HIGH (ref 6–20)
Lab: 33 mg/dL — ABNORMAL HIGH (ref 6–20)
Potassium: 3.9 mmol/L (ref 3.3–5.1)
Potassium: 3.9 mmol/L (ref 3.3–5.1)
Sodium: 131 mmol/L — ABNORMAL LOW (ref 133–145)
Sodium: 135 mmol/L (ref 133–145)

## 2012-12-09 LAB — EOSINOPHILS, URINE
Eosinophils,UR: <1 /hpf (ref <1)
SLIDE # (Urine): 667

## 2012-12-09 LAB — POCT GLUCOSE
Glucose POCT: 406 mg/dL — ABNORMAL HIGH (ref 60–99)
Glucose POCT: 385 mg/dL — ABNORMAL HIGH (ref 60–99)
Glucose POCT: 360 mg/dL — ABNORMAL HIGH (ref 60–99)
Glucose POCT: 375 mg/dL — ABNORMAL HIGH (ref 60–99)
Glucose POCT: 342 mg/dL — ABNORMAL HIGH (ref 60–99)
Glucose POCT: 306 mg/dL — ABNORMAL HIGH (ref 60–99)

## 2012-12-09 LAB — UREA NITROGEN, URINE: Urea Nitrogen,UR: 296 mg/dL

## 2012-12-09 LAB — MAGNESIUM: Magnesium: 1.6 mEq/L (ref 1.3–2.1)

## 2012-12-09 LAB — SODIUM, URINE: Sodium,UR: 18 mEq/L

## 2012-12-09 MED ORDER — ACETAMINOPHEN 500 MG PO TABS *I*
1000.0000 mg | ORAL_TABLET | Freq: Three times a day (TID) | ORAL | Status: DC | PRN
Start: 2012-12-09 — End: 2012-12-11
  Administered 2012-12-09 – 2012-12-11 (×4): 1000 mg via ORAL
  Filled 2012-12-09 (×4): qty 2

## 2012-12-09 MED ORDER — INSULIN LISPRO (HUMAN) 100 UNIT/ML IJ/SC SOLN *WRAPPED*
0.0000 [IU] | Freq: Three times a day (TID) | SUBCUTANEOUS | Status: DC
Start: 2012-12-09 — End: 2012-12-10
  Administered 2012-12-09: 20 [IU] via SUBCUTANEOUS
  Administered 2012-12-09 (×2): 22 [IU] via SUBCUTANEOUS
  Administered 2012-12-10: 14 [IU] via SUBCUTANEOUS

## 2012-12-09 MED ORDER — GLUCOSE 40 % PO GEL *I*
15.0000 g | ORAL | Status: DC | PRN
Start: 2012-12-09 — End: 2012-12-11

## 2012-12-09 MED ORDER — SERTRALINE HCL 50 MG PO TABS *I*
100.0000 mg | ORAL_TABLET | Freq: Every day | ORAL | Status: DC
Start: 2012-12-09 — End: 2012-12-11
  Administered 2012-12-09 – 2012-12-11 (×3): 100 mg via ORAL
  Filled 2012-12-09 (×3): qty 2

## 2012-12-09 MED ORDER — ALBUTEROL SULFATE HFA 108 (90 BASE) MCG/ACT IN AERS *I*
2.0000 | INHALATION_SPRAY | RESPIRATORY_TRACT | Status: DC | PRN
Start: 2012-12-09 — End: 2012-12-11
  Filled 2012-12-09: qty 8

## 2012-12-09 MED ORDER — POLYETHYLENE GLYCOL 3350 PO PACK 17 GM *I*
17.0000 g | PACK | Freq: Two times a day (BID) | ORAL | Status: DC | PRN
Start: 2012-12-09 — End: 2012-12-11

## 2012-12-09 MED ORDER — SODIUM CHLORIDE 0.9 % IV SOLN WRAPPED *I*
125.0000 mL/h | Status: DC
Start: 2012-12-09 — End: 2012-12-10
  Administered 2012-12-09 (×2): 125 mL/h via INTRAVENOUS

## 2012-12-09 MED ORDER — INSULIN GLARGINE 100 UNIT/ML SC SOLN *WRAPPED*
100.0000 [IU] | Freq: Every evening | SUBCUTANEOUS | Status: DC
Start: 2012-12-09 — End: 2012-12-10
  Administered 2012-12-09 (×2): 100 [IU] via SUBCUTANEOUS

## 2012-12-09 MED ORDER — IPRATROPIUM-ALBUTEROL 0.5-2.5 MG/3ML IN SOLN *I*
3.0000 mL | Freq: Four times a day (QID) | RESPIRATORY_TRACT | Status: DC
Start: 2012-12-09 — End: 2012-12-11
  Administered 2012-12-09 – 2012-12-10 (×8): 3 mL via RESPIRATORY_TRACT
  Filled 2012-12-09 (×9): qty 3

## 2012-12-09 MED ORDER — BUDESONIDE-FORMOTEROL FUMARATE 160-4.5 MCG/ACT IN AERO *I*
2.0000 | INHALATION_SPRAY | Freq: Two times a day (BID) | RESPIRATORY_TRACT | Status: DC
Start: 2012-12-09 — End: 2012-12-11
  Administered 2012-12-09 – 2012-12-11 (×5): 2 via RESPIRATORY_TRACT
  Filled 2012-12-09: qty 6

## 2012-12-09 MED ORDER — CLOPIDOGREL BISULFATE 75 MG PO TABS *I*
75.0000 mg | ORAL_TABLET | Freq: Every day | ORAL | Status: DC
Start: 2012-12-09 — End: 2012-12-11
  Administered 2012-12-09 – 2012-12-11 (×3): 75 mg via ORAL
  Filled 2012-12-09 (×3): qty 1

## 2012-12-09 MED ORDER — HEPARIN SODIUM 5000 UNIT/ML SQ *I*
5000.0000 [IU] | Freq: Three times a day (TID) | SUBCUTANEOUS | Status: DC
Start: 2012-12-09 — End: 2012-12-11
  Administered 2012-12-09 – 2012-12-11 (×7): 5000 [IU] via SUBCUTANEOUS
  Filled 2012-12-09 (×7): qty 1

## 2012-12-09 MED ORDER — GLUCAGON HCL (RDNA) 1 MG IJ SOLR *WRAPPED*
1.0000 mg | INTRAMUSCULAR | Status: DC | PRN
Start: 2012-12-09 — End: 2012-12-11

## 2012-12-09 MED ORDER — TRAZODONE HCL 50 MG PO TABS *I*
100.0000 mg | ORAL_TABLET | Freq: Every evening | ORAL | Status: DC
Start: 2012-12-09 — End: 2012-12-09

## 2012-12-09 MED ORDER — PANTOPRAZOLE SODIUM 40 MG PO TBEC *I*
40.0000 mg | DELAYED_RELEASE_TABLET | Freq: Every morning | ORAL | Status: DC
Start: 2012-12-09 — End: 2012-12-11
  Administered 2012-12-09 – 2012-12-11 (×3): 40 mg via ORAL
  Filled 2012-12-09 (×3): qty 1

## 2012-12-09 MED ORDER — DEXTROSE 50 % IV SOLN *I*
25.0000 g | INTRAVENOUS | Status: DC | PRN
Start: 2012-12-09 — End: 2012-12-11

## 2012-12-09 MED ORDER — ALBUTEROL SULFATE (2.5 MG/3ML) 0.083% IN NEBU *I*
INHALATION_SOLUTION | RESPIRATORY_TRACT | Status: AC
Start: 2012-12-09 — End: 2012-12-09
  Filled 2012-12-09: qty 3

## 2012-12-09 NOTE — Progress Notes (Signed)
Home Health Assessment    Completed by: Walker Kehr, RN  Phone: [phone]  Referred by: [facility], care coordinator    Source of Information: medical record    Home Health indicators present: May be discharged with complex medication or feeding regimen and May need PT, OT or ST at home    Plan: Home care referral started; agency chosen [facility]    Comments: Pt known to [facility] in the past. Will follow pt's hospital course and assist with pt's transition to home, when safe and appropriate.  Please note that pt lives alone and will need to be independent with mobility in order to return home safely.      Thank you for the referral.    Walker Kehr, BSN RN  Care Transitions Manager for [facility]  [phone]    After hours & weekends please call [phone]

## 2012-12-09 NOTE — H&P (Addendum)
Hospital Medicine Admission H&P    CC: "my legs gave out"    HPI:  57 yo F with PMH significant for COPD, Asthma, CAD with MI, Ischemic Stroke, DM, and HTN presenting after a fall when she was coming out of the elevator and her legs "gave out". She was lightheaded at the time. She denies LOC or heat injury. She landed on her buttock. She did not have pain after the fall except for a mild headache that has now resolved. She reports that she has good fluid intake and drinks 6 bottles of water daily. Her blood sugars have been elevated at over 300 every day. Her urine out put has not changed (she voids about 4X a day). She did take her anti-hypertensive agents this morning. She does take meloxicam daily.   She has been having shortness of breath worse with walking and chest pain that is only present on paplaption but these have been present since 2008. She denies diarrhea or change in appetite. She denies fever, chills, URI symptoms, or dysuria.      In the ED she was also given toradol for her leg pain and has left LE doppler which showed no DVT. While in the ED she was noted to be hypotensive with BP 71/48 and she was given 3L of NS fluid boluses. Her BP responded to the bolus appropriately. She was also noted to have a markedly elevated BUN, creatinine, and anion gap on her BMP and an elevated CK.   Past Medical History   Diagnosis Date   . Asthma    . COPD (chronic obstructive pulmonary disease)    . Coronary artery disease    . Diabetes mellitus    . Hypertension    . Ischemic Stroke 03/06/2006     Created by Conversion    . Prior Myocardial Infarction 03/19/2007     Created by Conversion    . Unspecified cerebral artery occlusion with cerebral infarction      2008   . Arthritis        Past Surgical History   Procedure Laterality Date   . Coronary angioplasty with stent placement     . Bunionectomy         Home Medications  Prior to Admission medications    Medication Sig Start Date End Date Taking? Authorizing  Provider   traZODone (DESYREL) 100 MG tablet TAKE UP TO 3 TABLETS (300MG  TOTAL) BY MOUTH NIGHTLY AS NEEDED FOR SLEEP 12/05/12   [name], MD   busPIRone (BUSPAR) 30 MG tablet TAKE 1 TABLET BY MOUTH TWO TIMES A DAY. 12/05/12   [name], MD   amLODIPine (NORVASC) 10 MG tablet TAKE 1 TABLET (10MG  TOTAL) BY MOUTH EVERY DAY 12/05/12   [name], MD   gabapentin (NEURONTIN) 300 MG capsule TAKE 1 CAPSULE BY MOUTH NIGHTLY. 12/05/12   [name], MD   rosuvastatin (CRESTOR) 40 MG tablet Take 1 tablet (40 mg total) by mouth daily (with dinner) 11/26/12   [name], MD   morphine (MS CONTIN) 15 MG 12 hr tablet Take 1 tablet (15 mg total) by mouth 2 times daily as needed for Pain   MDD 30 mg 11/21/12   [name], MD   acetaminophen (ACETAMINOPHEN EXTRA STRENGTH) 500 mg tablet Take 2 tablets (1,000 mg total) by mouth 3 times daily as needed   FOR PAIN. MAX/DAY=6 TABLETS. DECREASE BY 1 TABLET FOR EVERY LORTAB 10/31/12   [name], MD   meloxicam (MOBIC) 15 MG  tablet Take 1 tablet (15 mg total) by mouth daily   Take with food. 10/31/12   [name], MD   clopidogrel (PLAVIX) 75 MG tablet TAKE 1 TABLET BY MOUTH DAILY 10/21/12   [name], MD   omeprazole (PRILOSEC) 40 MG capsule TAKE 1 CAPSULE BY MOUTH DAILY 10/21/12   [name], MD   metoprolol (TOPROL-XL) 100 MG 24 hr tablet Take 1 tablet (100 mg total) by mouth daily   Do not crush or chew. May be divided. 10/22/12 04/20/13  [name], MD   omeprazole (PRILOSEC) 40 MG capsule TAKE 1 CAPSULE BY MOUTH DAILY 10/15/12   Mihok, Lequita Halt, MD   HUMALOG KWIKPEN 100 UNIT/ML Kwikpen INJECT 30 UNITS INTO THE SKIN 3 TIMES DAILY BEFORE MEALS 10/10/12   [name], MD   polyvinyl alcohol (LIQUIFILM TEARS) 1.4 % ophthalmic solution Place 1 drop into both eyes as needed for Dry Eyes 10/09/12   Burnetta Sabin, MD   aspirin 325 MG tablet TAKE 1 TABLET BY MOUTH DAILY 10/02/12   [name], MD   wheelchair Dispense 1  motorized wheelchair  Dx: DJD (ICD-9 715.96); COPD (ICD-9 496)  Needed for lifetime 09/27/12   [name], MD   sertraline (ZOLOFT) 100 MG tablet Take 1 tablet (100 mg total) by mouth daily 09/23/12 03/22/13  [name], MD   hydrOXYzine HCl (ATARAX) 50 MG tablet Take 1 tablet (50 mg total) by mouth 3 times daily as needed for Itching or Anxiety 09/17/12   [name], MD   tiotropium [facility]) 18 MCG inhalation capsule Inhale 1 capsule (18 mcg total) into the lungs daily 08/05/12   [name], MD   generic DME Use as directed. 08/05/12   [name], MD   generic DME Use as directed. 08/05/12   [name], MD   polyethylene glycol [facility]) PACK powder Take 17 g by mouth daily 08/05/12   [name], MD   docusate sodium (COLACE) 100 MG capsule Take 1 capsule (100 mg total) by mouth daily 08/05/12   [name], MD   insulin glargine (LANTUS SOLOSTAR) 100 UNIT/ML injection pen Inject 50 Units at 2 separate sites for total 100 Units. 08/05/12   [name], MD   albuterol-ipratropium [facility]) [phone] MCG/ACT inhaler Inhale 1-2 puffs into the lungs every 4 hours as needed for Wheezing or Shortness of Breath   Shake well before each use. 07/30/12   [name], MD   albuterol (PROVENTIL, VENTOLIN, PROAIR HFA) 108 (90 BASE) MCG/ACT inhaler Inhale 2 puffs into the lungs every 4 hours as needed for Wheezing or Shortness of Breath   Shake well before each use. 07/30/12   [name], MD   blood glucose monitor (FREESTYLE LITE) kit Use as instructed 07/17/12   [name], MD   FREESTYLE LITE test strip Test 4 times daily as instructed.  ICD9 250.02; Diabetes mellitus, uncontrolled. 07/17/12   [name], MD   lancets (FREESTYLE) Use 4 times daily as instructed for blood glucose testing.  ICD9 250.02; Diabetes mellitus, uncontrolled. 07/17/12   [name], MD   insulin lispro (HUMALOG KWIKPEN) 100 UNIT/ML injection pen Inject 0-30 Units into the skin 4 times  daily (before meals and nightly)   according to sliding scale. 07/17/12   [name], MD   insulin pen needle (BD ULTRA-FINE PEN NEEDLE MINI) 31G X 5 MM Use 4 times daily as instructed with Humalog pen.  ICD-9 250.02  Diabetes mellitus, uncontrolled. 07/17/12   [name], MD  insulin pen needle (BD ULTRA-FINE PEN NEEDLE MINI) 31G X 5 MM Use nightly as instructed with Lantus pen.  ICD-9 250.02  Diabetes mellitus, uncontrolled. 07/17/12   [name], MD   adjust bath/shower seat Use as directed 07/17/12   [name], MD   walker Use as directed 07/17/12   [name], MD   losartan-hydrochlorothiazide [facility]) 100-25 MG per tablet Take 1 tablet by mouth every morning 07/17/12 01/13/13  [name], MD   Lubricants (K-Y LUBRICANT JELLY SENSITIVE) GEL Use as directed 06/14/12   Mack Hook, NP   sodium chloride (OCEAN) 0.65 % nasal spray 1 spray by Each Nare route as needed for Congestion 05/08/12   [name], MD   PEG 3350 packet  12/28/11   [provider]   budesonide-formoterol [facility]) 160-4.5 MCG/ACT inhaler Inhale 2 puffs into the lungs 2 times daily 02/21/12   [name], MD   dextrose (GLUTOSE) 40 % oral gel Take 15 g by mouth once as needed for Low blood sugar 12/28/11   [name], MD   starch (THICK-IT 2) powder packet Add to liquid or pureed foods for thickening as needed. 09/26/11   [name], MD        Allergies  is allergic to keppra and lisinopril.     Family History  family history includes Alcohol abuse in her father; Arthritis in her sister; Coronary art dis in an other family member; Diabetes in her brother, mother, and another family member; Heart disease in an other family member; Hypertension in her mother and another family member; Other in her brother and father; Substance abuse in her brother and sister.     Social History   reports that she quit smoking about 2 years ago. Her smoking use included Cigarettes. She smoked 0.00  packs per day. She has never used smokeless tobacco. She reports that she does not drink alcohol or use illicit drugs.     ROS:  Negative except HPI    Physical Exam:  Filed Vitals:    12/08/12 2354   BP: 102/55   Pulse: 82   Temp: 36.8 C (98.2 F)   Resp: 22   Height:    Weight:         Gen: Patient is in NAD  HENT: NC/AT, normal ears and nose, oral pharynx without lesions, MMM  Eyes: Anicteric, conjunctivae not injected, PERRLA, EOMI  Neck: Normal thyroid, no cervical lymphadenopathy, supple  Lungs: Bilateral wheezes R>L. Minimal crackles. She is occasionally using abdominal muscles to breath but appears comfortable.  Heart: RRR no m/r/g  Abd: BS+, soft, NT, disrended, no HSM  Ext: No LE edema. Legs non-tender  Neuro: A&Ox3, CN II-XII, strength full in UE and LE    Laboratory:  Recent Results (from the past 24 hour(s))   POCT GLUCOSE    Collection Time     12/08/12  8:39 PM       Result Value Range    Glucose POCT 168 (*) 60 - 99 mg/dL   ETHANOL    Collection Time     12/08/12  8:40 PM       Result Value Range    Ethanol <10     CBC AND DIFFERENTIAL    Collection Time     12/08/12  8:40 PM       Result Value Range    WBC 8.6  4.0 - 10.0 THOU/uL    RBC 4.6  3.9 - 5.2 MIL/uL  Hemoglobin 11.9  11.2 - 15.7 g/dL    Hematocrit 37  34 - 45 %    MCV 82  79 - 95 fL    RDW 16.2 (*) 11.7 - 14.4 %    Platelets 274  160 - 370 THOU/uL    Seg Neut % 49.0  34.0 - 71.1 %    Lymphocyte % 43.3  19.3 - 51.7 %    Monocyte % 6.7  4.7 - 12.5 %    Eosinophil % 0.6 (*) 0.7 - 5.8 %    Basophil % 0.2  0.1 - 1.2 %    Neut # K/uL 4.2  1.6 - 6.1 THOU/uL    Lymph # K/uL 3.7  1.2 - 3.7 THOU/uL    Mono # K/uL 0.6  0.2 - 0.9 THOU/uL    Eos # K/uL 0.1  0.0 - 0.4 THOU/uL    Baso # K/uL 0.0  0.0 - 0.1 THOU/uL   BASIC METABOLIC PANEL    Collection Time     12/08/12  8:40 PM       Result Value Range    Glucose 160 (*) 60 - 99 mg/dL    Sodium 161  096 - 045 mmol/L    Potassium 3.8  3.3 - 5.1 mmol/L    Chloride 92 (*) 96 - 108 mmol/L    CO2 19 (*) 20  - 28 mmol/L    Anion Gap 22 (*) 7 - 16    UN 49 (*) 6 - 20 mg/dL    Creatinine 4.09 (*) 0.51 - 0.95 mg/dL    GFR,Caucasian 13 (*)     GFR,Black 15 (*)     Calcium 8.8  8.6 - 10.2 mg/dL   CK ISOENZYMES    Collection Time     12/08/12  8:40 PM       Result Value Range    CK 509 (*) 34 - 145 U/L    Mass CKMB 5.8 (*) 0.0 - 2.9 ng/mL    Relative Index 1.1  0.0 - 5.0 %       Imaging:  Chest     US Doppler Vein Left Lower Extremity    12/08/2012   IMPRESSION:   No DVT in the left lower extremity.   END OF IMPRESSION        Assessment and Plan:  This is a case of hypotension and acute renal insufficiency in a 57 yo female with poorly controlled DM type II, COPD, asthma, ischemic stroke and hypertension. Her hypotension was likely due to dehydration worsened by her anti-hypertensive medication she took this AM. Her AKI could be prerenal or intrinsic renal (AIN, ATN).      PLAN:  Hypotension likely from dehydration, BP is wnl now  - she reports her PO intake is good but her BGs are persistently > 300, she likely has ongoing glucosuria which can cause a severe diuresis  - UA glucosuria  - last urine microalbumin/Cr ratio in Jan 2014 was elevated at level concerning for diabetic nephopathy, will repeat urine microalbumin today     Acute Renal Insufficiency, likely prerenal   - BUN/Cr ratio 13  - daily NSAID(meloxicam) use could also cause AIN     - FeNa 0.2% (indicates prerenal)  - pending urine eosinophils    Diabetes type II, last A1C was 11.4  - lantus 100 units nightly  - takes lispro 30 units TID at home. Will reduce SSI nutritional baseline to 10 units TID with meals  Asthma/COPD, mild wheeze on exam today  - Symbicort BID  - DuoNebs QID  - will check chest x-ray PA/lateral for pneumonia, (she has been more SOB and tachypnesic recently)    Stroke  - on plavix daily  - hold full dose ASA due to AKI    FEN  F: NS mIVF  E: BMP daily  N: diabetic diet    DVT ppx: heparin  GI ppx: protonix    CODE: Full Code    Author:  Warren Danes, MD  as of: 12/09/2012 at: 12:29 AM  Pager Med-Peds R2  [phone]    I saw and evaluated the patient. I agree with the resident's/fellow's findings and plan of care as documented above.    Dimitri Ped, MD

## 2012-12-09 NOTE — Plan of Care (Signed)
Problem: Safety  Goal: Patient will remain free of falls  Outcome: Progressing towards goal  Bed alarm on for patient. Reinforce pt. To call when needs to ambulate.Jannet Mantis, RN

## 2012-12-09 NOTE — Progress Notes (Signed)
Pt ambulated from stretcher to bed with standby assist. Pt reports using a walker at baseline. Denies pain. Pt with     audible  Wheezes, neb admin; wheeze resolved. Urine sample sent. Pt BG 406, lantus admin will recheck at 0700.     Provider made aware. Pt lives home alone in apartment. Skin intact. Bed alarm in place until pt. Demonstrates use to     For assistance.

## 2012-12-09 NOTE — Plan of Care (Signed)
Glycemic control    . Blood glucoses 70-180 mg/dl Goal not met        BG 406.   Cognitive function    . Cognitive function will be maintained or return to baseline Maintaining        Mobility    . Patient's functional status is maintained or improved Maintaining    Per pt ambulates with walker at home. Up with assistance with nursing    Nutrition    . Patient's nutritional status is maintained or improved Maintaining    Pt ate 2 box lunch in ED, 1 sugar free pudding on floor    Pain/Comfort    . Patient's pain or discomfort is manageable Maintaining    Denies pain    Psychosocial    . Demonstrates ability to cope with illness Maintaining        Safety    . Patient will remain free of falls Maintaining        Free from falls, bed alarm on.

## 2012-12-09 NOTE — Progress Notes (Signed)
Utilization Management    Level of Care Inpatient as of the date 12/08/2012      Chantale Leugers L Anselm-Cooke, RN     Pager: [phone]

## 2012-12-09 NOTE — Progress Notes (Addendum)
FM R1 Medicine Accept and Progress Note    Interval History: Pt arrived on the unit yesterday after being admitted for a fall after coming out of the elevator in her apartment building. The prior hx provided is mostly accurate. The patient did endorse that she had urinary frequency, but not urgency or dysuria over the last few days. The fall was observed by her friend, who did not see seizure-like activity or soiling of clothing with feces or urine. First fall. No post-ictal confusion. Did not hit her head. Awaiting CXR this am?    Subjective: Pt feels somewhat better today, asks for coffee. Urinating well. Eating some. Denies extensive ROS, including fever, chills, HA, dizziness, lightheadedness, SOB, palpitations, chest pain, n/v/d, extremity pain, extremity weakness.    Objective:  I/Os    Intake/Output Summary (Last 24 hours) at 12/09/12 0655  Last data filed at 12/09/12 0445   Gross per 24 hour   Intake    549 ml   Output    100 ml   Net    449 ml       Pertinent lab findings:  Na 131 (133)  BUN/Cr 47/3.0 (49(3.7) with baseline 1.1-1.2  Gluc 160-406  UCr 321, UN 296, UNa 18  CK 509  MBCK 5.8      New/pertinent imaging:   ECHO 03/2012  Summary   1. Mild concentric left ventricular hypertrophy     2. Left ventricular systolic function appears low-normal with an  EF between 50 - 55 %.    3. Left ventricular diastolic filling pattern indicates mildly  impaired relaxation.    4. Right ventricle is normal in size and function.    5. Moderate left atrial enlargement by volume.    6. Mild mitral and tricuspid valve regurgitation.    7. Mild pulmonary hypertension.    8. No prior report on file for comparison.        U/S lower LLE 10/05  IMPRESSION:   No DVT in the left lower extremity.    Awaiting CXR    New/pertinent micro:   UA pH 1.021, protein 25, 2+ leuk est, neg nitirite, 11-5- RBC, 11-50WBC, <1 eosinophils 2+ bac, 1+ squamous epithelia, gluc 300    Vital signs in last 24 hours:  Temp:  [36 C (96.8 F)-36.8 C  (98.2 F)] 36.7 C (98.1 F)  Heart Rate:  [79-86] 79  Resp:  [13-22] 20  BP: (71-141)/(24-101) 106/59 mmHg    Physical Exam  Gen: AA woman lying in bed, NAD, cooperative, interactive though with slow and somewhat hoarse speech, AOx3  HEENT: AT, NC, mmm, EOMI  Cardiac: s1/2, rrr, no r/g/m heard  Resp: CTAB, no wheeze or cough heard  Abd: soft, nontender, nondistended, obese, NTTP, +BS  Ext: AT, warm, dry without lesions; DP, PT 2+ bilat in feet bilat; radial pulse 2+ bilat    Assessment/Plan:    AKI  -BUN/Cr 47/3.0 (49(3.7) with baseline 1.1-1.2  -FeNA 0.1%, prerenal  -no hx of excessive use of NSAIDs though does use meloxicam 15mg  for DJD , and got ketorolac 60mg  once 10/05, had HoTN on presentation  -pt likely has CKD as well  -f/u UA cx, though afebrile with Tmax 36.8 and WBC 5.8    Asymtpomatic bactruia  -afebrile, awaiting cultures  -may simply need f/u as outpt    Fall  -noncardiogenic, likely 2/2 hypotension b/c of dehydration and glycosuria (UA gluc 300)  -IVF NS 125cc/hr  -no repeated events overnight  -orthostatic BP before  leaving    Acute hypotension, now resolved after saline  -normotensive presently    HTN, prior ACS  -normotensive and hemodynamically stable with sBP low 100s overnight, HR 79C  -continue to follow clinically  -held home metoprolol 100mg   -clopdiogrel at home, crestor 40mg   -    DMII, uncontrolled  -BGs quite labile 160-406  -lantus 100, though possibly given all in one spot  -home is lantus 50x2, lispro 0-30 at meals    COPD  -resp status likely at baseline  -satting >91% room air  -has home combivent  -on symbicort    Anxiety/depression  -zoloft     GI/ FEN   F: NS at 125cc/hr PIV L Line  Tubes   E:    N: diabetic Nutrition c/s?  pantoprazole    Last BM   Yesterday?      Prophylaxis/ Skin:  DVT: heparin -->change to lovenox  Wound: none  Ulcer:   PT/OT:     Dispo: <1 day. Hemodynamically stable. Just need to follow UA and orthostatics.    LOS: 1days/ Admitted on     Code Status:  Full code.       Author: Priscille Kluver, MD  Note created: 12/09/2012  at: 6:55 AM   Family Medicine R1  Pager [phone]    I saw and evaluated the patient. I agree with the resident's/fellow's findings and plan of care as documented above.   Of note patient with significant drop in Hct from admission. Likely dilutional but would follow-up tomorrow.     Dimitri Ped, MD

## 2012-12-09 NOTE — Student Note (Signed)
[facility] Family Medicine Team C  H&P    Subjective    CC: bilat leg pain  HPI:   Gloria Ray is a 57 yo morbidly obese female with medical h/o htn, CAD s/p stent, stroke,  COPD, asthma, insomnia, anxiety, and depression presenting with significant leg pain. On 12/08/2012 around 4-5pm, Malczewski felt sudden onset of +10/10 aching, burning, aching leg pain that made it very difficult for her to stand or walk in her apartment. She reports that she held onto a wall and then called the ambulance. When she was in [place] ED, she was found to be very hypotensive (BP 71/48) received albuterol, a 3L NS bolus, and ketorolac (toradol) 60mg  IM.    Bady denied CP, SOB, palpitations, n/v, h/a, lightheadedness, dizziness, fevers, chills, or night sweats. She denies previous h/o similar symptoms. She denies h/o leg swelling or claudication symptoms.    PMH: CAD, htn, asthma, COPD, degenerative joint disease, anxiety, depression, insomnia, presbyopia, vocal cord disease  PSH: coronary angioplasty with stent  Social Hx: reports previous smoking hx 10 years ago but unable to specify amount of smoking, denies illicit drug use, some alcohol use but unable to specify amount  Family Hx: father: alcohol abuse; sister: arthritis, substance abuse; mother: diabetes, htn; brother: diabetes, substance abuse; other family member: CAD  Allergies: keppra, lisinopril  Medications:   No current facility-administered medications on file prior to encounter.     Current Outpatient Prescriptions on File Prior to Encounter   Medication Sig Dispense Refill   . busPIRone (BUSPAR) 30 MG tablet TAKE 1 TABLET BY MOUTH TWO TIMES A DAY.  60 tablet  0   . gabapentin (NEURONTIN) 300 MG capsule TAKE 1 CAPSULE BY MOUTH NIGHTLY.  30 capsule  1   . rosuvastatin (CRESTOR) 40 MG tablet Take 1 tablet (40 mg total) by mouth daily (with dinner)  30 tablet  5   . morphine (MS CONTIN) 15 MG 12 hr tablet Take 1 tablet (15 mg total) by mouth 2 times daily  as needed for Pain   MDD 30 mg  40 tablet  0   . acetaminophen (ACETAMINOPHEN EXTRA STRENGTH) 500 mg tablet Take 2 tablets (1,000 mg total) by mouth 3 times daily as needed   FOR PAIN. MAX/DAY=6 TABLETS. DECREASE BY 1 TABLET FOR EVERY LORTAB  180 tablet  5   . meloxicam (MOBIC) 15 MG tablet Take 1 tablet (15 mg total) by mouth daily   Take with food.  30 tablet  3   . clopidogrel (PLAVIX) 75 MG tablet TAKE 1 TABLET BY MOUTH DAILY  30 tablet  5   . metoprolol (TOPROL-XL) 100 MG 24 hr tablet Take 1 tablet (100 mg total) by mouth daily   Do not crush or chew. May be divided.  30 tablet  5   . omeprazole (PRILOSEC) 40 MG capsule TAKE 1 CAPSULE BY MOUTH DAILY  30 capsule  5   . HUMALOG KWIKPEN 100 UNIT/ML Kwikpen INJECT 30 UNITS INTO THE SKIN 3 TIMES DAILY BEFORE MEALS  15 mL  5   . aspirin 325 MG tablet TAKE 1 TABLET BY MOUTH DAILY  30 tablet  2   . hydrOXYzine HCl (ATARAX) 50 MG tablet Take 1 tablet (50 mg total) by mouth 3 times daily as needed for Itching or Anxiety  90 tablet  0   . generic DME Use as directed.  1 each  0   . insulin glargine (LANTUS SOLOSTAR) 100 UNIT/ML injection pen  Inject 50 Units at 2 separate sites for total 100 Units.  30 mL  5   . albuterol-ipratropium (COMBIVENT) 18-103 MCG/ACT inhaler Inhale 1-2 puffs into the lungs every 4 hours as needed for Wheezing or Shortness of Breath   Shake well before each use.  1 Inhaler  5   . albuterol (PROVENTIL, VENTOLIN, PROAIR HFA) 108 (90 BASE) MCG/ACT inhaler Inhale 2 puffs into the lungs every 4 hours as needed for Wheezing or Shortness of Breath   Shake well before each use.  1 Inhaler  5   . blood glucose monitor (FREESTYLE LITE) kit Use as instructed  1 each  0   . FREESTYLE LITE test strip Test 4 times daily as instructed.  ICD9 250.02; Diabetes mellitus, uncontrolled.  100 each  5   . adjust bath/shower seat Use as directed  1 each  0   . losartan-hydrochlorothiazide (HYZAAR) 100-25 MG per tablet Take 1 tablet by mouth every morning  30 tablet  5    . budesonide-formoterol (SYMBICORT) 160-4.5 MCG/ACT inhaler Inhale 2 puffs into the lungs 2 times daily  1 Inhaler  5   . traZODone (DESYREL) 100 MG tablet TAKE UP TO 3 TABLETS (300MG  TOTAL) BY MOUTH NIGHTLY AS NEEDED FOR SLEEP  90 tablet  5   . amLODIPine (NORVASC) 10 MG tablet TAKE 1 TABLET (10MG  TOTAL) BY MOUTH EVERY DAY  30 tablet  1   . polyvinyl alcohol (LIQUIFILM TEARS) 1.4 % ophthalmic solution Place 1 drop into both eyes as needed for Dry Eyes  15 mL  10   . wheelchair Dispense 1 motorized wheelchair  Dx: DJD (ICD-9 715.96); COPD (ICD-9 496)  Needed for lifetime  1 each  0   . sertraline (ZOLOFT) 100 MG tablet Take 1 tablet (100 mg total) by mouth daily  30 tablet  5   . tiotropium (SPIRIVA) 18 MCG inhalation capsule Inhale 1 capsule (18 mcg total) into the lungs daily  30 capsule  1   . generic DME Use as directed.  1 each  0   . polyethylene glycol (GLYCOLAX/MIRALAX) PACK powder Take 17 g by mouth daily  100 packet  1   . docusate sodium (COLACE) 100 MG capsule Take 1 capsule (100 mg total) by mouth daily  30 capsule  1   . lancets (FREESTYLE) Use 4 times daily as instructed for blood glucose testing.  ICD9 250.02; Diabetes mellitus, uncontrolled.  100 each  5   . insulin lispro (HUMALOG KWIKPEN) 100 UNIT/ML injection pen Inject 0-30 Units into the skin 4 times daily (before meals and nightly)   according to sliding scale.  36 mL  5   . insulin pen needle (BD ULTRA-FINE PEN NEEDLE MINI) 31G X 5 MM Use 4 times daily as instructed with Humalog pen.  ICD-9 250.02  Diabetes mellitus, uncontrolled.  100 each  5   . insulin pen needle (BD ULTRA-FINE PEN NEEDLE MINI) 31G X 5 MM Use nightly as instructed with Lantus pen.  ICD-9 250.02  Diabetes mellitus, uncontrolled.  100 each  5   . walker Use as directed  1 each  0   . Lubricants (K-Y LUBRICANT JELLY SENSITIVE) GEL Use as directed  1 Tube  1   . sodium chloride (OCEAN) 0.65 % nasal spray 1 spray by Each Nare route as needed for Congestion  45 mL  1   .  dextrose (GLUTOSE) 40 % oral gel Take 15 g by mouth once as needed for  Low blood sugar  112.5 g  5   . starch (THICK-IT 2) powder packet Add to liquid or pureed foods for thickening as needed.  200 packet  5     ROS   General: denies change in appetite or recent change in weight, denies fever, chills, sweats  HEENT: denies h/a, change in vision/diplopia, lightheadedness, nasal congestion, sore throat, lightheadedness, or LOC  Thoracic: denies SOB, CP  Bleeding/Immunity: denies enlarged glands, lumps  Endocrine: reports increased thirst and urination  MSK: bilateral leg pain as described above  GI: denies n/v/d, denies BRBPR or dark, tarry stools  GU: denies hematuria, reports dark yellow urine without dysuria, orange color, or sedimentation    Objective   PE  Vitals:   Temp:  [36 C (96.8 F)-36.8 C (98.2 F)] 36.4 C (97.5 F)  Heart Rate:  [79-86] 86  Resp:  [13-22] 22  BP: (71-141)/(24-101) 112/74 mmHg  General: NAD, resting in bed, eating and asking nurse for 5 more packs of graham crackers  HEENT: NCAT, PERRL, EOMI, supple neck, symmetric facies  Resp: diffuse expiratory wheezes  CV: RRR +S1/S2, m/r/g  Abdominal: soft, nt/nd, +BS, no guarding or rebound tenderness  MSK: +4-5/5 strength bilaterally dorsiflexion and plantarflexion at ankles, flexion and extension of knees, flexion and extension of hips  Neuro: MSE: AxO to person, place, time, situation; poor eye contact, somber mood and flat affect  Extremities: no edema, warm, +2 PTs bilat    Labs     Recent Labs  Lab 12/09/12  0408 12/08/12  2040   Sodium 131* 133   Potassium 3.9 3.8   Chloride 96 92*   CO2 20 19*   UN 47* 49*   Creatinine 3.00* 3.70*   Calcium 7.6* 8.8   Glucose 419* 160*       Recent Labs  Lab 12/09/12  0408 12/08/12  2040   WBC 5.8 8.6   Hemoglobin 9.8* 11.9   Hematocrit 31* 37   Platelets 216 274     No results found for this basename: TSH, T3F, FT4,  in the last 168 hours    Recent Labs  Lab 12/09/12  0408   Magnesium 1.6   phosphorus  4.5    No results found for this basename: CHOL, HDL, LDLC, TRIG, CHHDC,  in the last 168 hours    Recent Labs  Lab 12/08/12  2040   Mass CKMB 5.8*   CK 509    UA  Clear, dark yellow urine  300 glucose, trace ketones  1.021 specific gravity  25 protein  2+ leuk esterase  11-50 RBC and WBC  2+ bacteria and transepithelial cells  1+ squames and renal epithelial cells  Urine creatinine 321  Urine Na+ 18  Urine BUN 296    ECG   12/08/2012: NSR, T wave flattening, Q waves in inferior leads  07/29/2012: NSR, Q waves in inferior leads    Imaging   12/09/2012 CXR: No acute cardiopulmonary disease  12/08/2012 Doppler US of LLE: negative for DVT    Micro   12/09/2012 UA reflex to culture pending culture results    Assessment   Lastar Fedeli is a 57 yo morbidly obese female with medical h/o htn, CAD s/p stent, ischemic stroke,  COPD, asthma, insomnia, anxiety, and depression presenting with significant leg pain likely d/t an ischemic event or initial presentation of claudication. She was also very hypotensive, likely from dehydration given her renal picture. Her urinalysis measurements with FeNa of 0.1, indicate that  she likely has prerenal concern and given the WBC, the leuk esterase, the bacteria, she likely has an UTI. She also has a drop in hct from 37 -> 31, which is likely dilutional as she received 3L NS but will need f/u.    Plan  1. AKI/UTI   -actively hydrate with IV NS   -monitor with UA q12h   -hold on NSAIDs (meloxicam)   -awaiting cultures for potential concurrent UTI  2. DM II   -cont home regimen of lantus 50U bid, lispro 0-30 at meals   -monitor BGs q2h until stabilized  3. Hypertension   -hold on meds given current BPs WNL and previous low BP  4. Hct drop   -CBC every day   5. COPD/anxiety/depression   -cont symbicort and zoloft, hold on trazodone    GI/ FEN   F: po intake with IV NS  E: q4h today and then q12h  N: diabetic diet    Last BM   May have been yesterday, [place] not sure    Prophylaxis/ Skin:    DVT: none now. Cont with anticoagulation (heparin)   Wound/ulcer: none   PT/OT: not necessary  Access: 2 PIV on forearms  Dispo: pending AKI improvement  LOS: 1 days/ Admitted on 12/08/2012    Code Status: Full code    Author: Adelina Mings, CC3  Note created: 12/09/2012  at: 9:16 AM  Pager [phone]     This is a medical student note. Please see attending note for final evaluation and plan.

## 2012-12-09 NOTE — Interdisciplinary Rounds (Signed)
Interdisciplinary Rounds Note    Date: 12/09/2012   Time: 11:11 AM   Attendance:  Care Coordinator and Social Worker    Admit Date/Time:  12/08/2012 10:56 PM    Principal Problem: <principal problem not specified>  Problem List:   Patient Active Problem List    Diagnosis Date Noted   . UTI (lower urinary tract infection) 09/16/2012     Priority: High     Noted in ED 08/2012, not treated.  Needs test of remission/cure.     . Chest heaviness 07/29/2012     Priority: High   . Hypertension 03/19/2007     Priority: High     Diabetes Health Maintenence Review       []  Patient correctly reports ABC's of Diabetes    HbA1c:           []  AT goal       [x]  NOT at goal     Blood pressure:       [x]  AT goal                []  NOT at goal   LDL:                          []  AT goal   [x]  NOT at goal     Microalbumin:          []  Normal     [x]  + Proteinuria                       []  Unknown   ACEi/ARB Rx:           [x]  Yes          []  No   []  N/A      Immunizations:        Influenza              []  Up to date            [x]  Needed  []  Refused        Pneumovax          [x]  Up to date            []  Needed  []  Refused        HepB                    []  Up to date            [x]  Needed  []  Refused     Foot Exam:               [x]  Up to date            []  Needed  []  Refused   Eye  Exam:               []  Up to date            [x]  Needed  []  Refused       Requested Referrals:          []  None        []  Diabetes Education        []  Ophthalmology     []  Podiatry            . Type II, uncontrolled 03/19/2007     Priority: High     Ophthalmologist: [place]um  Podiatrist:  [facility] [phone]  Diabetes Health Maintenence Review       []  Patient correctly reports ABC's of Diabetes    HbA1c:           []  AT goal       [x]  NOT at goal     Blood pressure:       [x]  AT goal                []  NOT at goal   LDL:                          []  AT goal   [x]  NOT at goal     Microalbumin:          []  Normal     [x]  + Proteinuria                        []  Unknown   ACEi/ARB Rx:           [x]  Yes          []  No   []  N/A        Immunizations:        Influenza              []  Up to date            [x]  Needed  []  Refused        Pneumovax          [x]  Up to date            []  Needed  []  Refused        HepB                    []  Up to date            [x]  Needed  []  Refused       Foot Exam:               [x]  Up to date            []  Needed  []  Refused   Eye  Exam:               []  Up to date            [x]  Needed  []  Refused       Requested Referrals:          [x]  None        []  Diabetes Education        []  Ophthalmology     []  Podiatry              . 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     Marland Kitchen Controlled substance agreement signed 12/08/2011     Priority: Medium     For DJD associated bilateral knee pain.  Therapeutic goal of independent ADL, increased activity and weight loss.  Copy of controlled substance agreement in Media tab for printing.  -      12/01/2011 Morphine 12h tabs 30mg  BID PRN.  60 tabs,  Renew on 01/01/2012.  -    12/28/2011 Morphine 12h tabs 60mg  BID PRN.  40 tabs,  Renew on 01/08/2012.   Rx: morphine (MS CONTIN) 30 MG 12 hr tablet; Take 2 tablets (60 mg total) by mouth 2 times  daily as needed for Severe Pain   MDD 120 mg.  10 day supply.     Marland Kitchen DJD (degenerative joint disease) of knee, bilateral 07/21/2011     Priority: Medium     Family history of DJD     . Insomnia 05/02/2011     Priority: Medium     Currently and historically using doses 100-300mg  trazodone qhs.  Hx symptoms concerning for long QT on doses >300mg  (self admin, non-adherent to prescribed dosage).     Marland Kitchen Anxiety and depression 11/10/2010     Priority: Medium     PHQ9  PHQ9 05/08/2012   PHQ-9 Total Score 7       PHQ9 11/17/2011   PHQ-9 Total Score 14          . COPD (chronic obstructive pulmonary disease) 12/30/2008     Priority: Medium     COPD exacerbations for year: 03/2011;  12/2011, 03/2012, 07/2012  No recent PFTs, likely GOLD 2 or 3.    mMRC likely 3, CAT likely >21.    Former smoker, in remission.  No O2 requirement.  Current Meds:    - budesonide-formoterol (Symbicort) 160-4.5 BID    - albuterol PRN     - Combivent PRN and prednisone 40mg  daily for 5 days for exacerbation, call MD if using       . Coronary Artery Disease 03/19/2007     Priority: Medium     Stents placed; on aspirin + plavix    December 2010 Stress Echo  Overall impression:   1. Partially transient inferior wall defect consistent with limited mixed ischemia/infarction in the RCA distribution   2. Preserved left ventricular systolic function by gated SPECT.        . Pain, dental 05/30/2011     Priority: Low   . Bilateral presbyopia 05/30/2011     Priority: Low   . Dental caries 11/10/2010     Priority: Low   . Asthma 03/19/2007     Priority: Low     Created by Conversion       . Acute kidney injury 12/08/2012       The patient's problem list and interdisciplinary care plan was reviewed.    Discharge Planning  Lives in: Multi-level apartment  Location of bedroom: 1st level  Location of bathroom: 1st level  Lives With: Alone  Can they assist with pt needs after discharge?: Not applicable  *Does patient currently have home care services?: No     *Current External Services: None                      Plan    Anticipated Discharge Date:     Discharge Disposition: Unable to determine at this time, referred to [facility]

## 2012-12-09 NOTE — Progress Notes (Signed)
Pt transferred to x ray

## 2012-12-10 LAB — CBC AND DIFFERENTIAL
Baso # K/uL: 0 10*3/uL (ref 0.0–0.1)
Basophil %: 0.2 % (ref 0.1–1.2)
Eos # K/uL: 0.1 10*3/uL (ref 0.0–0.4)
Eosinophil %: 1.2 % (ref 0.7–5.8)
Hematocrit: 30 % — ABNORMAL LOW (ref 34–45)
Hemoglobin: 9.2 g/dL — ABNORMAL LOW (ref 11.2–15.7)
Lymph # K/uL: 1.8 10*3/uL (ref 1.2–3.7)
Lymphocyte %: 42.7 % (ref 19.3–51.7)
MCV: 83 fL (ref 79–95)
Mono # K/uL: 0.3 10*3/uL (ref 0.2–0.9)
Monocyte %: 7.4 % (ref 4.7–12.5)
Neut # K/uL: 2 10*3/uL (ref 1.6–6.1)
Platelets: 189 10*3/uL (ref 160–370)
RBC: 3.6 MIL/uL — ABNORMAL LOW (ref 3.9–5.2)
RDW: 16.1 % — ABNORMAL HIGH (ref 11.7–14.4)
Seg Neut %: 48.3 % (ref 34.0–71.1)
WBC: 4.2 10*3/uL (ref 4.0–10.0)

## 2012-12-10 LAB — OCCULT BLOOD X 1, STOOL
Card 1 Date: 100714
Occult Blood 1: NEGATIVE

## 2012-12-10 LAB — URINALYSIS WITH MICROSCOPIC
Blood,UA: NEGATIVE
Glucose,UA: 300 mg/dL — AB
Ketones, UA: NEGATIVE
Nitrite,UA: NEGATIVE
Protein,UA: NEGATIVE mg/dL
RBC,UA: NONE SEEN /hpf (ref 0–2)
Specific Gravity,UA: 1.012 (ref 1.001–1.030)
pH,UA: 5 (ref 5.0–8.0)

## 2012-12-10 LAB — BASIC METABOLIC PANEL
Anion Gap: 9 (ref 7–16)
CO2: 22 mmol/L (ref 20–28)
Calcium: 7.9 mg/dL — ABNORMAL LOW (ref 8.6–10.2)
Chloride: 107 mmol/L (ref 96–108)
Creatinine: 1.35 mg/dL — ABNORMAL HIGH (ref 0.51–0.95)
GFR,Black: 51 * — AB
GFR,Caucasian: 44 * — AB
Glucose: 305 mg/dL — ABNORMAL HIGH (ref 60–99)
Lab: 24 mg/dL — ABNORMAL HIGH (ref 6–20)
Potassium: 4.1 mmol/L (ref 3.3–5.1)
Sodium: 138 mmol/L (ref 133–145)

## 2012-12-10 LAB — POCT GLUCOSE
Glucose POCT: 310 mg/dL — ABNORMAL HIGH (ref 60–99)
Glucose POCT: 199 mg/dL — ABNORMAL HIGH (ref 60–99)
Glucose POCT: 180 mg/dL — ABNORMAL HIGH (ref 60–99)
Glucose POCT: 212 mg/dL — ABNORMAL HIGH (ref 60–99)
Glucose POCT: 196 mg/dL — ABNORMAL HIGH (ref 60–99)

## 2012-12-10 LAB — AEROBIC CULTURE

## 2012-12-10 LAB — MICROALBUMIN, URINE, RANDOM
Creatinine,UR: 321 mg/dL — ABNORMAL HIGH (ref 20–300)
Microalb/Creat Ratio: 38.9 mg MA/g CR — ABNORMAL HIGH (ref 0.0–29.9)
Microalbumin,UR: 12.49 mg/dL

## 2012-12-10 LAB — MAGNESIUM: Magnesium: 1.7 mEq/L (ref 1.3–2.1)

## 2012-12-10 LAB — PHOSPHORUS: Phosphorus: 3 mg/dL (ref 2.7–4.5)

## 2012-12-10 MED ORDER — INSULIN GLARGINE 100 UNIT/ML SC SOLN *WRAPPED*
120.0000 [IU] | Freq: Every evening | SUBCUTANEOUS | Status: DC
Start: 2012-12-10 — End: 2012-12-11
  Administered 2012-12-10: 120 [IU] via SUBCUTANEOUS

## 2012-12-10 MED ORDER — INSULIN LISPRO (HUMAN) 100 UNIT/ML IJ/SC SOLN *WRAPPED*
0.0000 [IU] | Freq: Three times a day (TID) | SUBCUTANEOUS | Status: DC
Start: 2012-12-10 — End: 2012-12-11
  Administered 2012-12-10: 14 [IU] via SUBCUTANEOUS
  Administered 2012-12-10: 16 [IU] via SUBCUTANEOUS
  Administered 2012-12-11: 14 [IU] via SUBCUTANEOUS

## 2012-12-10 NOTE — Interdisciplinary Rounds (Signed)
Interdisciplinary Rounding Note   Gloria Ray was discussed today during health team rounds.   Problems addressed:  Active Problems:    Acute kidney injury      The plan of care was reviewed with the following health team members:  Charge Nurse  Social Worker  Physical Therapist    Izora Ribas, RN 12/10/2012 10:10 AM

## 2012-12-10 NOTE — Progress Notes (Addendum)
Family Medicine R3 Progress Note                                                Significant 24 Hour Events       IVF continued at 125    Subjective           Patient reports feeling some  Intermittent dizziness but improving, ambulating to bathroom without difficulty.  She reports eating well, drinking is going slower.  Denies SOB, CP, palpitations, nausea.         Discussed reasons for hospital admission, primarily due to uncontrolled diabetes.  Patient still does not have home care services- nursing and aide for ADLs, cancelled by her insurance and she relied heavily on these and are likely the main cause of this admission.   .     Objective      Physical Exam  Recent vital signs reviewed and notable for:     WNL with stable trends.     BP 132/82  Pulse 86  Temp(Src) 36.2 C (97.2 F) (Temporal)  Resp 18  Ht 1.626 m (5\' 4" )  Wt 107.8 kg (237 lb 10.5 oz)  BMI 40.77 kg/m2  SpO2 96%   General Constitutional: Obese female, NAD.  Baseline appearance of breathlessness.  HEENT: EOM full, MMM  Cardiovascular: Distant heart sounds.  NR, RR, S1, fixed split S2, no m/g/r   Pulmonary: Volitional wheezing, none when relaxed.  Crackles at bases bilaterally, good air movement.   Gastrointestinal: Mildly firm, non-tender, protuberant, BS normal.  Musculoskeletal: Moving all extremities normally.  No edema of LE.  Neurologic: Alert and oriented to person and place.     Skin: Warm and dry, no wounds noted.     Recent Lab, Micro, and Imaging Studies   Personally reviewed and notable for:    Cr 1.35 <- 1.78 <-3.0 <- 3.7    Assessment and Plan     57yo female with history of uncontrolled DM, HTN, COPD, DJD, anxiety and depression with poor adherence to medications as medical visits admitted after fall with hypotension and acute renal failure, likely pre-renal etiology.    AKI  Baseline Cr ~1.  FeNA 0.1%, pre-renal.   Improving.    -continue daily BMP  -continue to hold antihypertensives  -maintain hydration    Asymtpomatic  bacteruia   -disposition is not to treat as asymptomatic    Fall noncardiogenic, likely 2/2 hypotension b/c of dehydration (glucosuria with uncontrolled DMII)  -orthostatic BP today  -PT eval prior to discharge    Acute hypotension, now resolved after saline   -normotensive presently   -consider adding back    HTN, prior ACS normotensive and hemodynamically stable, BPs increasing with improved hydration  -continue to follow clinically   -hold home metoprolol 100mg , resume for elevated BPs  -clopdiogrel at home, crestor 40mg      DMII, uncontrolled   -lantus 100, increase to 120 Units qhs (60 in 2 separate injection sites)  -sliding scale High    COPD.  No O2 at home.  Stable.  -Continue home meds    Anxiety/depression   -zoloft     GI/ FEN   F: NS at 125cc/hr PIV L Line Tubes   E: Mild hypocalcemia, likely dilutional with IVF  N: diabetic    Anemia.  Normal on admission, likely dilutional vs anemia of chronic  disease.  Normocytic, elevated RDW.   Outpatient workup    Prophylaxis/ Skin:   DVT: continue PSQH  Wound: none   Ulcer: Continue home pantoprazole for hx of GERD  PT/OT: eval prior to discharge    Dispo: Likely home tomorrow, high risk of re-hospitalization if patient is not comfortable with discharge.    Social work please coordinate resumption of home nursing and health aides as [employer]' termination of these services is the likely primary cause of this admission due to patient's need for support with meds and ADLs.  Unlikely to need increased level of care beyond this support at home.  MD to coordinate signatures needed for power wheelchair previously ordered as outpatient.    Estimated LOS: 4-5days (Admitted on 12/08/2012)    Code Status: Full code.       [name] MD, PhD  Family Medicine, R3  12/10/2012 8:17 AM    I saw and evaluated the patient. I agree with the resident's/fellow's findings and plan of care as documented above.    Dimitri Ped, MD

## 2012-12-10 NOTE — Progress Notes (Signed)
Demographics    Religious/Cultural Factors  Other (comment)    Comments  [facility] of Residence  [place]    Marital Status  Not married    Ethnicity/Race  African American    Risk Factors    Risk Factors  Adjustment to Dx/Injury/Illness    Contact Information    Spokesperson Name  Owynn Benenhaley    Relationship to Patient  Daughter    Phone Number(s)  [phone] (but phone not currently working per pt)   Alternate Spokesperson?  Yes    Spokesperson Alternate  Aram Candela    Relationship to Patient  Daughter    Phone Number(s)  [phone]    Emergency Contact other than spokesperson?  Yes    Emergency Contact  Gaylan Gerold    Relationship to Patient  Other relative  (aunt--lives in [place])    Phone Number(s)  [phone]    Living Situation    Lives With  Alone    Comments  [facility] Ge[facility]    Type of Home  Apartment    # Of Steps In Home  0    # Steps to Enter Home  0  (elevator access)    One Story or Two  One story    Bedroom  First floor    Bathroom  First floor    Utilitites Working  Yes    Psychosocial        Current Goal of Care  Curative        Activities of Daily Living    Transfers  Independent    Assistive Device  rollator walker (with seat and breaks)    Ambulation  Independent      Bathing/Grooming  Independent   Nutrition  Independent    Household Management  Independent   Income Information    Vocational  (worked in [employer] )    Income Situation  SSI    Insurance Information  [employer] [place] [id number]   Pharmacy Used  [facility]  not a [facility]        SW reviewed chart and met with pt in her room this morning. Pt has had [facility] in the past for home care (nurisng and home health aides) but it ended in early August due to insurance coverage. Pt would like home care again as she has been struggling with ADLs- she's hoping she can have her same aide back (Stacy Love).     Pt's rollator walker is at home- she will need RMT wheelchair  transport upon d/c as she is not safe to ambulate the distance in to her home without her Rolator.     Pt would like to fill scripts for d/c meds here.    Rockne Menghini, LMSW Pager [phone]  1:31 PM 1:31 PM

## 2012-12-10 NOTE — Progress Notes (Addendum)
Discharge Planning    Lives With  Alone    Can they assist with pt needs after discharge?  N/A    *Does patient currently have home care services?  Yes    If yes, which agency?  [facility]    *Current External Services  None    Current Home Equipment  Walker; Grab bars; Shower chair; Commode; Hospital bed    Expected Discharge Date  12/11/12   Follow for:  Discharge Planning    SW Plan  SW to follow (see discharge plan)    Interventions    Interventions  Homecare service referral initiated    I    Plan d/c to home with services from [facility] when stable. Per Beth from [facility], pt has been approved for home care services by [employer]. Pt has been cleared for safe home d/c from PT standpoint- will need home PT which she can get through [facility].     SW will arrange d/c transport via RMT wheelchair when pt is ready for d/c. Pt will need to get any d/c meds here at [facility] so she can take them meds home with her upon d/c.    Rockne Menghini, LMSW Pager [phone]  12/10/2012 1:34 PM     Addendum:     SW received called from pt's case manager, Lynnell Grain [phone]). Per Alvino Chapel, pt is in [employer], which is licensed by [employer]. In order to return there her d/c instructions must state the pt "is able to return to congregate care level 3, can manage her own medications, and is able to walk 500' independently." Also, enriched housing has only 3 diets- controlled carbohydrates, no added salt, and regular- d/c instructions must indicate which diet pt will need to be on. Once d/c instructions are finalized, SW will need to fax them to Alvino Chapel at [phone] for her review and she will page SW back to confirm that pt can return.    SW reviewed with Alvino Chapel that pt will have HCR for home care, will be sent home via wheelchair transport, and will fill any scripts here before d/c. Alvino Chapel agreed with these plans. Alvino Chapel is aware I'm off tomorrow and SW Encarnacion Chu will be covering and will f/u as appropriate.    SW d/w Jae Dire from  PT who will see pt tomorrow to ensure she can walk 500'. SW paged Dr Clovis Riley re: this information.    Rockne Menghini, LMSW Pager [phone]  12/10/2012 3:06 PM

## 2012-12-10 NOTE — Progress Notes (Addendum)
Home Health Assessment - [facility]      Writer met with pt at the bedside; pt is well-known to [facility]. Pt lives alone in a one-level apartment on the second floor of building. Pt has elevator access. Pt uses [employer] for medical appointments. Has a glucometer and checks her own BG (although admits not consistently). Pt has a RW, tub seat, grab bars in shower, cane, and commode chair. Per pt, her PCP office is working on getting a motorized wheelchair for her for easier mobility.     Pt is agreeable to home care services upon discharge, and requested a specific home health aide, if she qualifies for this services. Writer asked pt if she was interested in Meals on Wheels, as she currently has difficulty with grocery shopping. The pt was agreeable to writer's suggestion of a day program for increased socialization; Clinical research associate will request that an HCR SW visit pt to explore this option.     An HCR nurse will see pt in the home and assess pt's medication understanding and compliance, diabetes management, and will also assess for the appropriateness of home health aide services for assistance with ADLs. HCR PT and SW will also be requested.    Will follow pt's hospital course and will assist with pt's transition to home, when safe and appropriate.    Thank you for the referral.    Walker Kehr, BSN RN  Care Transitions Manager for [facility]  [phone]    After hours & weekends please call [phone]

## 2012-12-10 NOTE — Progress Notes (Signed)
Physical Therapy    Physical Therapy evaluation order received. Attempted to see patient, however patient unavailable at this time due to still needs to eat breakfast. Nurse also requested writer wait until after pt eats d/t DM. Will follow up as schedule allows.    Thamas Jaegers, MSPT  Pager ID # [phone]  (text pager)

## 2012-12-10 NOTE — Progress Notes (Addendum)
Physical Therapy    Initial evaluation completed.  Addendum: Per SW, pt lives in enriched housing. Must be able to walk 500 feet, though she may rest during that (Pt does sit on her rollator at times at home).     12/10/12 1131   Prior Living    Prior Living Situation Reported by patient   Lives With Alone   Receives Help From Independent  (Wants to get aide services reinstated)   Type of Home Apartment   Location of Bedrooms 1st floor   Location of Bathrooms 1st floor   Medical Equipment in Home Rollator walker   Prior Function Level   Prior Function Level Reported by patient   Transfers Independent   Transfer Devices None   Walking Independent   Walking assistive devices used None   PT Tracking   PT TRACKING PT Assigned   Visit Number   Visit Number 1   Precautions/Observations   Precautions used x   LDA Observation IV lines   Fall Precautions General falls precautions   Pain Assessment   *Is the patient currently in pain? (No c/o pain at this time)   Vision    Current Vision Not tested   Cognition   Cognition No deficit noted   Additional Comments No gross deficit.   UE Assessment   UE Assessment Full range RUE AROM;Full range LUE AROM   LE Assessment   LE Assessment Full AROM RLE;Full AROM LLE   Sensation   Sensation Not tested   Bed Mobility   Bed mobility x   Supine to Sit Modified independent (device);Head of bed elevated   Sit to Supine Modified independent (device);Head of bed elevated   Transfers   Transfers x   Sit to Stand Modified independent (device)   Stand to sit Modified independent (device)   Transfer Assistive Device rolling walker   Additional comments From and to bed   Mobility   Mobility X   Gait Pattern Decreased cadence;Decreased R step length;Decreased R step height;Decreased L step length;Decreased L step height   Ambulation Assist Modified independent (device)   Ambulation Distance (Feet) 120'   Ambulation Assistive Device rolling walker   Balance   Balance x   Sitting - Static  Independent    Sitting - Dynamic Independent   Standing - Static Independent   Standing - Dynamic Independent   Assessment   Brief Assessment Appropriate for skilled therapy   Problem List Impaired endurance;Impaired ambulation;Impaired functional mobility   Patient / Family Goal Return home with aide services   Plan/Recommendation   Treatment Interventions Restorative PT;Bed mobility training;Transfers training;Pt/Family education;Strengthening;Gait training   PT Frequency 3-5x/wk;30 minute sessions   Hospital Stay Recommendations Out of bed with nursing assist;Ambulate daily with nursing assist   Discharge Recommendations Anticipate return to prior living arrangement;Home PT   PT Discharge Equipment Recommended None   Assessment/Recommendations Reviewed With: Nursing;Patient;Social Worker   Time Calculation   PT Untimed Codes 23   PT Unbilled Time 0   PT Total Treatment 23   PT Charges   PT [facility] Charges Eval [address] [place]  Pager ID # [phone]  (text pager)

## 2012-12-10 NOTE — Plan of Care (Signed)
Problem: Impaired Bed Mobility  Goal: STG - IMPROVE BED MOBILITY  Patient will perform bed mobility without rails and the head of bed flat with No assist (Independently)     Time frame: 3-5 days    Problem: Impaired Ambulation  Goal: STG - IMPROVE AMBULATION  Patient will ambulate 150-299 feet using a rolling walker with Modified independence    Time frame: 3-5 days        Comments:   Blessin Kanno, MSPT  Pager ID # [phone]  (text pager)

## 2012-12-10 NOTE — Student Note (Addendum)
FM Medicine Progress Note   This is a Psychologist, occupational note. Please see attending note for final evaluation and plan.     Overnight events   No acute events overnight.    Subjective/interval hx:   Kievit reports that she feels like she will be ready to leave tomorrow. She admits to some SOB and wheezes, but denies n/v/d, h/a, CP, abdominal pain. Inquired about being found with empty trazodone bottle, and Brozowski reports that she would never take extra doses, but she does take a dose every night before going to bed.    HPI:   Mehwish Olah is a 57 yo morbidly obese female with medical h/o htn, CAD s/p stent, stroke, COPD, asthma, insomnia, anxiety, and depression presenting with significant leg pain. On 12/08/2012 around 4-5pm, Barrionuevo felt sudden onset of +10/10 aching, burning, aching leg pain that made it very difficult for her to stand or walk in her apartment. She reports that she held onto a wall and then called the ambulance. When she was in [facility] ED, she was found to be very hypotensive (BP 71/48) received albuterol, a 3L NS bolus, and ketorolac (toradol) 60mg  IM.     Heney denied CP, SOB, palpitations, n/v, h/a, lightheadedness, dizziness, fevers, chills, or night sweats. She denies previous h/o similar symptoms. She denies h/o leg swelling or claudication symptoms.    PMhx, PShx, social, and family hx reviewed by attending     Objective:   Temp:  [36.3 C (97.3 F)-36.6 C (97.9 F)] 36.3 C (97.3 F)  Heart Rate:  [84-100] 91  Resp:  [20-24] 20  BP: (112-134)/(68-86) 134/78 mmHg     Exam:   Gen: NAD, resting comfortably in bed, asleep at the beginning of the exam  HEENT: NCAT, PERRL, EOMI, symmetric facies, supple neck  Cardiac: RRR +S1/S2, no m/r/g  Resp: some diffuse expiratory wheezes anteriorly, but CTAB posteriorly  Abd: soft, nt/nd, +bowel sound, no guarding or rebound tenderness  GU: no foley  Neuro: AxO to person, place, time, and situation; poor eye contact with interviewer, grumpy mood  and congruent affect, logical thought process, fair judgement  MSK: +4-5/5 plantarflexion and dorsiflexion of ankles, +4-5/5 flexion and extension of knees and hips  Ext: no edema, warm, +1-2 PTs bilat    Labs     Intake/Output Summary (Last 24 hours) at 12/10/12 0602  Last data filed at 12/10/12 0031   Gross per 24 hour   Intake 1858.17 ml   Output   1400 ml   Net 458.17 ml       Recent Labs  Lab 12/10/12  0324 12/09/12  1741 12/09/12  0408   Sodium 138 135 131*   Potassium 4.1 3.9 3.9   Chloride 107 104 96   CO2 22 20 20    UN 24* 33* 47*   Creatinine 1.35* 1.78* 3.00*   Calcium 7.9* 7.7* 7.6*   Glucose 305* 345* 419*       Recent Labs  Lab 12/10/12  0324 12/09/12  0408   Magnesium 1.7 1.6   Phosphorus 1.7    Recent Labs  Lab 12/10/12  0324 12/09/12  1741 12/09/12  0408   WBC 4.2 4.3 5.8   Hemoglobin 9.2* 10.0* 9.8*   Hematocrit 30* 32* 31*   Platelets 189 206 216       Recent Labs  Lab 12/08/12  2040   Mass CKMB 5.8*     No results found for this basename: TSH, T3F, FT4,  in  the last 168 hours    12/09/2012 UA  Clear, light yellow  Glucose 1000  Ketones, protein neg  Nitrite +  1+ blood (but no RBC seen), leuk esterase, squames  3+ bacteria  6-10 WBC    New/pertinent imaging: no new imaging since last progress note  New/pertinent micro: pending aerobic culture results    Assessment:   Tyrena Halls is a 57 yo morbidly obese female with medical h/o htn, CAD s/p stent, ischemic stroke, COPD, asthma, insomnia, anxiety, and depression presenting with significant leg pain likely d/t an ischemic event or initial presentation of claudication. She was also very hypotensive, likely from dehydration given her renal picture. Her previous urinalysis measurements with FeNa of 0.1, indicate that she likely has prerenal concern and given the WBC, the leuk esterase, the bacteria, she likely has an UTI. She also has a drop in hct from 37 -> 30, which is likely dilutional as she received 3L NS but will need continued f/u.      Plan:   1. AKI/UTI    -d/t IV NS    -hold on NSAIDs (meloxicam)    -awaiting cultures for potential concurrent UTI   2. DM II    -increase to lantus 120U, and increase lispro sliding scale at meals (yesterday had 100U lantus + 64U lispro)   -monitor BGs q6h until stabilized   3. Hypertension    -hold on meds given current BPs WNL and previous low BP   4. Hct drop    -CBC every day   5. COPD/anxiety/depression    -cont symbicort and zoloft, hold on trazodone   -reinforce with Demaine that she should give her trazodone to the nurse    GI/ FEN   Fluids: po intake  Electrolytes: every day   Nutrition: diabetic diet  -protonix 40mg  po qam    Last BM   yesterday    Prophylaxis/ Skin:   DVT: none. Cont anticoag (heparin)  Wound/ulcer: none  PT/OT: needs evaluation    Dispo: pending recovery from AKI    LOS: 2  admitted 12/08/2012    Code Status: Full code    Author: Adelina Mings, CC3  Note created: 12/10/2012  at: 6:02 AM  Pager [phone]    This is a medical student note. Please see attending note for final evaluation and plan.

## 2012-12-10 NOTE — Interdisciplinary Rounds (Signed)
Interdisciplinary Rounds Note    Date: 12/10/2012   Time: 4:12 PM   Attendance:  Care Coordinator and Social Worker    Admit Date/Time:  12/08/2012 10:56 PM    Principal Problem: <principal problem not specified>  Problem List:   Patient Active Problem List    Diagnosis Date Noted   . UTI (lower urinary tract infection) 09/16/2012     Priority: High     Noted in ED 08/2012, not treated.  Needs test of remission/cure.     . Chest heaviness 07/29/2012     Priority: High   . Hypertension 03/19/2007     Priority: High     Diabetes Health Maintenence Review       []  Patient correctly reports ABC's of Diabetes    HbA1c:           []  AT goal       [x]  NOT at goal     Blood pressure:       [x]  AT goal                []  NOT at goal   LDL:                          []  AT goal   [x]  NOT at goal     Microalbumin:          []  Normal     [x]  + Proteinuria                       []  Unknown   ACEi/ARB Rx:           [x]  Yes          []  No   []  N/A      Immunizations:        Influenza              []  Up to date            [x]  Needed  []  Refused        Pneumovax          [x]  Up to date            []  Needed  []  Refused        HepB                    []  Up to date            [x]  Needed  []  Refused     Foot Exam:               [x]  Up to date            []  Needed  []  Refused   Eye  Exam:               []  Up to date            [x]  Needed  []  Refused       Requested Referrals:          []  None        []  Diabetes Education        []  Ophthalmology     []  Podiatry            . Type II, uncontrolled 03/19/2007     Priority: High     Ophthalmologist: Flaum  Podiatrist:  [facility] [phone]  Diabetes Health Maintenence Review       []  Patient correctly reports ABC's of Diabetes    HbA1c:           []  AT goal       [x]  NOT at goal     Blood pressure:       [x]  AT goal                []  NOT at goal   LDL:                          []  AT goal   [x]  NOT at goal     Microalbumin:          []  Normal     [x]  + Proteinuria                        []  Unknown   ACEi/ARB Rx:           [x]  Yes          []  No   []  N/A        Immunizations:        Influenza              []  Up to date            [x]  Needed  []  Refused        Pneumovax          [x]  Up to date            []  Needed  []  Refused        HepB                    []  Up to date            [x]  Needed  []  Refused       Foot Exam:               [x]  Up to date            []  Needed  []  Refused   Eye  Exam:               []  Up to date            [x]  Needed  []  Refused       Requested Referrals:          [x]  None        []  Diabetes Education        []  Ophthalmology     []  Podiatry              . 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     Marland Kitchen Controlled substance agreement signed 12/08/2011     Priority: Medium     For DJD associated bilateral knee pain.  Therapeutic goal of independent ADL, increased activity and weight loss.  Copy of controlled substance agreement in Media tab for printing.  -      12/01/2011 Morphine 12h tabs 30mg  BID PRN.  60 tabs,  Renew on 01/01/2012.  -    12/28/2011 Morphine 12h tabs 60mg  BID PRN.  40 tabs,  Renew on 01/08/2012.   Rx: morphine (MS CONTIN) 30 MG 12 hr tablet; Take 2 tablets (60 mg total) by mouth 2 times  daily as needed for Severe Pain   MDD 120 mg.  10 day supply.     Marland Kitchen DJD (degenerative joint disease) of knee, bilateral 07/21/2011     Priority: Medium     Family history of DJD     . Insomnia 05/02/2011     Priority: Medium     Currently and historically using doses 100-300mg  trazodone qhs.  Hx symptoms concerning for long QT on doses >300mg  (self admin, non-adherent to prescribed dosage).     Marland Kitchen Anxiety and depression 11/10/2010     Priority: Medium     PHQ9  PHQ9 05/08/2012   PHQ-9 Total Score 7       PHQ9 11/17/2011   PHQ-9 Total Score 14          . COPD (chronic obstructive pulmonary disease) 12/30/2008     Priority: Medium     COPD exacerbations for year: 03/2011;  12/2011, 03/2012, 07/2012  No recent PFTs, likely GOLD 2 or 3.    mMRC likely 3, CAT likely >21.    Former smoker, in remission.  No O2 requirement.  Current Meds:    - budesonide-formoterol (Symbicort) 160-4.5 BID    - albuterol PRN     - Combivent PRN and prednisone 40mg  daily for 5 days for exacerbation, call MD if using       . Coronary Artery Disease 03/19/2007     Priority: Medium     Stents placed; on aspirin + plavix    December 2010 Stress Echo  Overall impression:   1. Partially transient inferior wall defect consistent with limited mixed ischemia/infarction in the RCA distribution   2. Preserved left ventricular systolic function by gated SPECT.        . Pain, dental 05/30/2011     Priority: Low   . Bilateral presbyopia 05/30/2011     Priority: Low   . Dental caries 11/10/2010     Priority: Low   . Asthma 03/19/2007     Priority: Low     Created by Conversion       . Acute kidney injury 12/08/2012       The patient's problem list and interdisciplinary care plan was reviewed.    Discharge Planning  Lives in: Multi-level apartment  Location of bedroom: 1st level  Location of bathroom: 1st level  Lives With: Alone  Can they assist with pt needs after discharge?: Not applicable  *Does patient currently have home care services?: No     *Current External Services: None                      Plan    Anticipated Discharge Date:     Discharge Disposition: Home with Services, referred [facility]

## 2012-12-10 NOTE — Progress Notes (Signed)
Talked to provider(Mitchell) 2x about pt.  Pt states she takes Norvasc @ home.

## 2012-12-11 LAB — CBC AND DIFFERENTIAL
Baso # K/uL: 0 10*3/uL (ref 0.0–0.1)
Basophil %: 0.2 % (ref 0.1–1.2)
Eos # K/uL: 0.1 10*3/uL (ref 0.0–0.4)
Eosinophil %: 1 % (ref 0.7–5.8)
Hematocrit: 31 % — ABNORMAL LOW (ref 34–45)
Hemoglobin: 9.7 g/dL — ABNORMAL LOW (ref 11.2–15.7)
Lymph # K/uL: 1.7 10*3/uL (ref 1.2–3.7)
Lymphocyte %: 34.6 % (ref 19.3–51.7)
MCV: 82 fL (ref 79–95)
Mono # K/uL: 0.4 10*3/uL (ref 0.2–0.9)
Monocyte %: 7.6 % (ref 4.7–12.5)
Neut # K/uL: 2.8 10*3/uL (ref 1.6–6.1)
Platelets: 214 10*3/uL (ref 160–370)
RBC: 3.8 MIL/uL — ABNORMAL LOW (ref 3.9–5.2)
RDW: 16.2 % — ABNORMAL HIGH (ref 11.7–14.4)
Seg Neut %: 56.4 % (ref 34.0–71.1)
WBC: 4.9 10*3/uL (ref 4.0–10.0)

## 2012-12-11 LAB — BASIC METABOLIC PANEL
Anion Gap: 11 (ref 7–16)
CO2: 24 mmol/L (ref 20–28)
Calcium: 8.6 mg/dL (ref 8.6–10.2)
Chloride: 102 mmol/L (ref 96–108)
Creatinine: 0.84 mg/dL (ref 0.51–0.95)
GFR,Black: 90 *
GFR,Caucasian: 78 *
Glucose: 212 mg/dL — ABNORMAL HIGH (ref 60–99)
Lab: 10 mg/dL (ref 6–20)
Potassium: 3.7 mmol/L (ref 3.3–5.1)
Sodium: 137 mmol/L (ref 133–145)

## 2012-12-11 LAB — POCT GLUCOSE
Glucose POCT: 213 mg/dL — ABNORMAL HIGH (ref 60–99)
Glucose POCT: 168 mg/dL — ABNORMAL HIGH (ref 60–99)

## 2012-12-11 LAB — MAGNESIUM: Magnesium: 1.2 mEq/L — ABNORMAL LOW (ref 1.3–2.1)

## 2012-12-11 LAB — PHOSPHORUS: Phosphorus: 3.4 mg/dL (ref 2.7–4.5)

## 2012-12-11 MED ORDER — INSULIN GLARGINE 100 UNIT/ML SC SOPN *I*
PEN_INJECTOR | SUBCUTANEOUS | Status: DC
Start: 2012-12-11 — End: 2013-05-19

## 2012-12-11 NOTE — Progress Notes (Signed)
Pt requested to meet with this writer as she is anxious to get home today.  SW explained that her information needs to be reviewed by her care manager prior to discharge and that the team is working to ensure that all of her information and medications are correct and up to date.  SW will continue to follow as appropriate.    Encarnacion Chu, LMSW 12/11/2012 11:48 AM

## 2012-12-11 NOTE — Discharge Summary (Signed)
Name: Gloria Ray MRN: [id number] DOB: Nov 17, 1955     Admit Date: 12/08/2012     Discharge Attending Physician: Barnabas Harries M      Hospitalization Summary    CONCISE NARRATIVE: Gloria Ray presented on 12/08/2012 with bilateral leg pain that made it difficult for her to ambulate, hypotension (BP 70s/40s), along with an AKI complicated with asymptomatic bacteruria and hyperglycemia (BGs 300s-400s). Her hypotension resolved after 3L of IV fluids. The patient also was noted to have pre-renal AKI that improved over the course of hospitalization with hydration.  Her hyperglycemia improved with increase of Lantus from 100 to 120 Units qhs.  Her BP meds were held at discharge for normal range BPs off medication.       OTHER PROCEDURES/FINDINGS: Ultrasound doppler LLE 12/08/2012  IMPRESSION:     No DVT in the left lower extremity.  XRAY RESULTS: CXR 12/09/2012  Impression:  No acute pulmonary disease.        Signed: [name], MD  On: 12/11/2012  at: 7:29 AM

## 2012-12-11 NOTE — Progress Notes (Signed)
[facility]    Aware of d/c home today. HCR to see pt in the home one day after discharge to evaluate for home services.      Walker Kehr, BSN RN  Care Transitions Manager for [facility]  [phone]    After hours & weekends please call [phone]

## 2012-12-11 NOTE — Student Note (Signed)
FM Medicine Progress Note   This is a Psychologist, occupational note. Please see attending note for final evaluation and plan.     Overnight events   Gloria Ray had a 10/10 frontal h/a relieved temporarily with 1000mg  acetaminophen. No acute events overnight.    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 She denied n/v/d, lightheadedness, CP, abdominal pain, SOB, wheezing, and leg pain this morning.     SW and PT have recommended home care. Has been assessed by [facility] who agreed to assist with Gloria Ray's transition to home and will look into obtaining Meals on Wheels for Gloria Ray. Per [facility] note, Gloria Ray's PCP office is also looking to get her a motorized wheelchair.    HPI:   Gloria Ray is a 57 yo morbidly obese female with medical h/o htn, CAD s/p stent, stroke, COPD, asthma, insomnia, anxiety, and depression presenting with significant leg pain. On 12/08/2012 around 4-5pm, Gloria Ray felt sudden onset of +10/10 aching, burning, aching leg pain that made it very difficult for her to stand or walk in her apartment. She reports that she held onto a wall and then called the ambulance. When she was in [facility] ED, she was found to be very hypotensive (BP 71/48) received albuterol, a 3L NS bolus, and ketorolac (toradol) 60mg  IM.    Gloria Ray denied CP, SOB, palpitations, n/v, h/a, lightheadedness, dizziness, fevers, chills, or night sweats. She denies previous h/o similar symptoms. She denies h/o leg swelling or claudication symptoms.    PMhx, PShx, social, and family hx reviewed by attending     Objective:   Temp:  [36.2 C (97.2 F)-36.7 C (98.1 F)] 36.7 C (98.1 F)  Heart Rate:  [81-97] 90  Resp:  [18] 18  BP: (132-138)/(80-86) 132/86 mmHg     Exam:   Gen: NAD, resting comfortably in bed, asleep at the beginning of exam  HEENT: NCAT, PERRL, EOMI, supple neck, MMM,   Cardiac: RRR +S1/S2, no m/r/g  Resp: CTAB posteriorly, expiratory wheeze anteriorly   Abd: soft, nt/nd, +bowel sound,  no guarding or rebound tenderness   GU: no foley  Neuro: AxO to person, place, time, and situation; poor eye contact with interviewer, grumpy mood and congruent affect, logical thought process  Ext: no edema, warm, +1 L PT, +1-2 R PT    Labs     Intake/Output Summary (Last 24 hours) at 12/11/12 0603  Last data filed at 12/11/12 0545   Gross per 24 hour   Intake 3822.08 ml   Output   2350 ml   Net 1472.08 ml       Recent Labs  Lab 12/11/12  0300 12/10/12  0324 12/09/12  1741   Sodium 137 138 135   Potassium 3.7 4.1 3.9   Chloride 102 107 104   CO2 24 22 20    UN 10 24* 33*   Creatinine 0.84 1.35* 1.78*   Calcium 8.6 7.9* 7.7*   Glucose 212* 305* 345*       Recent Labs  Lab 12/11/12  0300 12/10/12  0324 12/09/12  0408   Magnesium 1.2* 1.7 1.6       Recent Labs  Lab 12/11/12  0300 12/10/12  0324 12/09/12  1741   WBC 4.9 4.2 4.3   Hemoglobin 9.7* 9.2* 10.0*   Hematocrit 31* 30* 32*   Platelets 214 189 206     No results found for this basename: TSH, T3F, FT4,  in the last 168  hours    New/pertinent imaging: No additional imaging since last progress note  New/pertinent micro:   12/08/2012 Aerobic urine culture: >100,000/mL E coli resistant to ampicillin, gentamicin, zosyn, and bactrim, inhibited by nitrofurantoin and tobramycin; sensitive to amikacin, aztreonam, cephalosporins, ciprofloxacin, ertapenem, meropenem  12/08/2012 Aerobic urine culture: 80,000/mL E coli with same resistance pattern as above    Assessment:   Gloria Ray is a 57 yo morbidly obese female with medical h/o htn, CAD s/p stent, ischemic stroke, COPD, asthma, insomnia, anxiety, and depression presenting with significant leg pain likely d/t an ischemic event or initial presentation of claudication.  Given the WBC, the leuk esterase, the Ecoli growing >100,000/mL bacteria, she likely has an UTI and has been experiencing significant glucosuria. Her urinalysis measurements with FeNa of 0.1, indicate that she likely has prerenal concern. She was also very  hypotensive, likely from dehydration given her renal picture.  She also has a drop in hct from 37 -> 32, which is likely dilutional and perhaps also from her hematuria as she received 3L NS but will need OP f/u.    Plan:   1. AKI/UTI    -encourage fluid intake   -hold on NSAIDs (meloxicam)   2. DM II    -cont regimen of lantus 120, lispro 0-35 at meals (received 120 U Lantus and 44U lispro yesterday)   -monitor BGs  3. Hypertension    -hold on meds given current BPs WNL and previous low BP   4. Hct drop    -OP f/u  5. COPD/anxiety/depression    -cont symbicort and zoloft, hold on trazodone     GI/ FEN   Fluids: po intake   Electrolytes: every day   Nutrition: diabetic diet  -protonix 40mg  po qam    Last BM   Yesterday 12/10/2012    Prophylaxis/ Skin:   DVT: none, cont anticoag (heparin)  Wound/ulcer: none  PT/OT: cleared for home care    Dispo: likely today      LOS: 3  admitted 12/08/2012    Code Status: Full code    Author: Adelina Ray, CC3  Note created: 12/11/2012  at: 6:03 AM  Pager [phone]    This is a medical student note. Please see attending note for final evaluation and plan.

## 2012-12-11 NOTE — Progress Notes (Addendum)
Family Medicine R3 Progress Note                                                Significant 24 Hour Events       IVF stopped   Evaluated by SW and PT for return to enriched housing    Subjective           Patient states she is ready to go home.  Complains of some home meds being held.  Discussed need to continue to hold home BP meds for HTN.  Denies dizziness, SOB, CP, palpitations, nausea.  Good intake and elimination.    Objective      Physical Exam  Recent vital signs reviewed and notable for:     WNL with stable trends.     BP 132/86  Pulse 90  Temp(Src) 36.7 C (98.1 F) (Temporal)  Resp 18  Ht 1.626 m (5\' 4" )  Wt 107.8 kg (237 lb 10.5 oz)  BMI 40.77 kg/m2  SpO2 97%   General Constitutional: Obese female, NAD.  Baseline appearance of breathlessness.  HEENT: EOM full, MMM  Cardiovascular: Distant heart sounds.  NR, RR, S1, fixed split S2, no m/g/r   Pulmonary: Volitional wheezing, none when relaxed.  Crackles at bases bilaterally, good air movement.   Gastrointestinal: Mildly firm, non-tender, protuberant, BS normal.  Musculoskeletal: Moving all extremities normally.  No edema of LE.  Neurologic: Alert and oriented to person and place.     Skin: Warm and dry, no wounds noted.     Recent Lab, Micro, and Imaging Studies   Personally reviewed and notable for:    Cr 0.84 <- 1.35 <- 1.78 <-3.0 <- 3.7    Assessment and Plan     57yo female with history of uncontrolled DM, HTN, COPD, DJD, anxiety and depression with poor adherence to medications as medical visits admitted after fall with hypotension and acute renal failure, likely pre-renal etiology.    AKI  Baseline Cr ~1.  FeNA 0.1%, pre-renal.   Resolved, Cr at baseline today.    -continue daily BMP  -continue to hold antihypertensives  -maintain hydration    Asymtpomatic bacteruia   -disposition is not to treat as asymptomatic    Fall noncardiogenic, likely 2/2 hypotension b/c of dehydration (glucosuria with uncontrolled DMII)  -orthostatic BP's not  recorded, likely not needed at this time  -PT eval prior to discharge for walking 500'    HTN, prior ACS normotensive and hemodynamically stable off medications at this time  -continue to follow clinically   -hold home BP meds on discharge, will re-introduce as BP rises outpatient  -home meds: clopdiogrel; crestor 40mg      DMII, uncontrolled 44 Units SSI yesterday  -Lantus, continue 120 Units qhs (60 in 2 separate injection sites)  -sliding scale High    COPD.  No O2 at home.  Stable.  -Continue home meds    Anxiety/depression   -zoloft     GI/ FEN   F: none  E: no abnormalities  N: diabetic diet    Anemia.  Normal on admission, likely dilutional vs anemia of chronic disease.  Normocytic, elevated RDW.   Outpatient workup    Prophylaxis/ Skin:   DVT: continue PSQH  Wound: none   Ulcer: Continue home pantoprazole for hx of GERD  PT/OT: eval prior to discharge  Dispo: Likely home today (12/11/2012), high risk of re-hospitalization if patient is not comfortable with discharge.    To return to enriched housing, patient must be able to manage her own medications, and walk 500' independently (may stop/rest in between walking)   Hold BP meds at home, add back as BP increases outpatient      Estimated LOS: 3 days (Admitted on 12/08/2012)    Code Status: Full code.     Gloria Patterson MD, PhD  Family Medicine, R3  12/11/2012 7:12 AM    I saw and evaluated the patient. I agree with the resident's/fellow's findings and plan of care as documented above. Details of my evaluation are as follows: Patient seen ~0900. Patient continued to complain regarding not receiving medications as she takes them at home. Patient states she is anxious to return home to take her medications as she knows she should.     I reiterated the rationale for holding antihypertensives including amlodipine and instructions to continue to do so upon discharge. I reviewed with the patient that Trazodone was discontinued due to concerns regarding QT  prolongation and advised her NOT to take this medication at home. Sleep hygiene was discussed as an alternative management strategy for insomnia. The team will reiterate instructions for medications prior to discharge.      Gloria Ped, MD

## 2012-12-11 NOTE — Plan of Care (Signed)
Mobility    • Patient's functional status is maintained or improved Maintaining        Pain/Comfort    • Patient's pain or discomfort is manageable Maintaining

## 2012-12-11 NOTE — Interdisciplinary Rounds (Signed)
Interdisciplinary Rounding Note   Gloria Ray was discussed today during health team rounds.   Problems addressed:  Active Problems:    * No active hospital problems. *      The plan of care was reviewed with the following health team members:  Charge Nurse  Social Worker  Physical Therapist    Izora Ribas, RN 12/11/2012 10:03 AM

## 2012-12-11 NOTE — Progress Notes (Signed)
Physical Therapy    Treatment session completed.       12/11/12 0933   PT Tracking   PT TRACKING PT Assigned   Visit Number   Visit Number 2   Precautions/Observations   LDA Observation None   Fall Precautions General falls precautions   Other SpO2 at 200' (after sitting approx 3 min) was 99%, immediately after sitting after another 37' was still 99% on room air, though pt does have some dyspnea.   Pain Assessment   *Is the patient currently in pain? X   0-10 Scale (Not listed on VAS)   Pain Location Back;Leg   Pain Orientation Lower;Right;Left  (Low back and both legs)   Pain Radiating Towards Low back, both legs, and some numbness in feet. Relieved at rest. Appears to be related to neurogenic claudication.   Pain Descriptors Numbness;Sore;Tightness   Pain Intervention(s) Repositioned  (Relief with seated rests.)   Transfers   Sit to Stand Independent   Stand to sit Independent   Transfer Assistive Device rolling walker;none   Additional comments From and to geri-chair x2   Mobility   Gait Pattern Decreased cadence;Decreased R step length;Decreased R step height;Decreased L step length;Decreased L step height   Ambulation Assist Modified independent (device)   Ambulation Distance (Feet) 500' total with 4 seated rests of 1-3 minutes.   Ambulation Assistive Device rolling walker   Plan/Recommendation   Treatment Interventions No further PT interventions   PT Frequency none further   Hospital Stay Recommendations Out of bed with nursing assist;Ambulate daily with nursing assist;May be out of bed with family assist;May ambulate with family assist   Discharge Recommendations Anticipate return to prior living arrangement;Home PT   PT Discharge Equipment Recommended None   Assessment/Recommendations Reviewed With: Nursing;Patient;Social Worker;Physician   Time Calculation   PT Timed Codes 23   PT Untimed Codes 0   PT Unbilled Time 0   PT Total Treatment 23   PT Charges   PT [facility] Charges Gait training (15 minx2)     Thamas Jaegers, MSPT  Pager ID # [phone]  (text pager)

## 2012-12-11 NOTE — Discharge Summary (Signed)
Name: Gloria Ray MRN: [id number] DOB: Apr 21, 1955     Admit Date: 12/08/2012   Date of Discharge: 12/11/2012    Discharge Attending Physician: Barnabas Harries M      Hospitalization Summary    CONCISE NARRATIVE: Gloria Ray presented on 12/08/2012 with bilateral leg pain that made it difficult for her to ambulate, hypotension (BP 70s/40s), along with an AKI complicated with asymptomatic bacteruria and hyperglycemia (BGs 300s-400s). Her hypotension resolved after 3L of IV fluids. The patient also was noted to have pre-renal AKI that improved over the course of hospitalization with hydration.  Her hyperglycemia improved with increase of Lantus from 100 to 120 Units qhs.  Her BP meds were held at discharge for normal range BPs off medication.     Please note that the patient is able to return to congregate care level 3, can manage her own medications, and is able to walk 500 feet independently.  The patient should be place on a  controlled carbohydrates diet, because of her diabetes.       Medication List      CHANGE how you take these medications         insulin glargine 100 UNIT/ML injection penCommonly known as:  LANTUS   SOLOSTARInject 60 Units at 2 separate sites for total 120 Units.What   changed:  additional instructions    omeprazole 40 MG capsuleCommonly known as:  PriLOSECTAKE 1 CAPSULE BY   MOUTH DAILYWhat changed:  Another medication with the same name was   removed. Continue taking this medication, and follow the directions you   see here.    polyethylene glycol Pack powderCommonly known as:  GLYCOLAX/MIRALAXTake   17 g by mouth dailyWhat changed:  Another medication with the same name   was removed. Continue taking this medication, and follow the directions   you see here.      CONTINUE taking these medications         acetaminophen 500 mg tabletCommonly known as:  ACETAMINOPHEN EXTRA   STRENGTHTake 2 tablets (1,000 mg total) by mouth 3 times daily as needed     FOR PAIN. MAX/DAY=6 TABLETS. DECREASE BY 1  TABLET FOR EVERY LORTAB    * adjust bath/shower seatUse as directed    * walkerUse as directed    * wheelchair- Dispense 1 motorized wheelchair    - Dx: DJD (ICD-9 715.96); COPD (ICD-9 496)    - Needed for lifetime    albuterol 108 (90 BASE) MCG/ACT inhalerCommonly known as:  PROVENTIL,   VENTOLIN, PROAIR HFAInhale 2 puffs into the lungs every 4 hours as needed   for Wheezing or Shortness of Breath   Shake well before each use.    albuterol-ipratropium 18-103 MCG/ACT inhalerCommonly known as:    COMBIVENTInhale 1-2 puffs into the lungs every 4 hours as needed for   Wheezing or Shortness of Breath   Shake well before each use.    aspirin 325 MG tabletTAKE 1 TABLET BY MOUTH DAILY    blood glucose monitor kitUse as instructed    budesonide-formoterol 160-4.5 MCG/ACT inhalerCommonly known as:    SYMBICORTInhale 2 puffs into the lungs 2 times daily    busPIRone 30 MG tabletCommonly known as:  BUSPARTAKE 1 TABLET BY MOUTH   TWO TIMES A DAY.    clopidogrel 75 MG tabletCommonly known as:  PLAVIXTAKE 1 TABLET BY MOUTH   DAILY    dextrose 40 % oral gelCommonly known as:  GLUTOSETake 15 g by mouth once   as needed  for Low blood sugar    docusate sodium 100 MG capsuleCommonly known as:  COLACETake 1 capsule   (100 mg total) by mouth daily    FREESTYLE LITE test stripTest 4 times daily as instructed.  ICD9 250.02;   Diabetes mellitus, uncontrolled.    gabapentin 300 MG capsuleCommonly known as:  NEURONTINTAKE 1 CAPSULE BY   MOUTH NIGHTLY.    generic DMEUse as directed.    generic DMEUse as directed.    hydrOXYzine HCl 50 MG tabletCommonly known as:  ATARAXTake 1 tablet (50   mg total) by mouth 3 times daily as needed for Itching or Anxiety    * insulin lispro 100 UNIT/ML injection penCommonly known as:  humaLOG   KwikPenInject 0-30 Units into the skin 4 times daily (before meals and   nightly)   according to sliding scale.    * humaLOG KwikPen 100 UNIT/ML injection penGeneric drug:  insulin   lisproINJECT 30 UNITS INTO THE SKIN  3 TIMES DAILY BEFORE MEALS    * insulin pen needle 31G X 5 MMCommonly known as:  BD ULTRA-FINE PEN   NEEDLE MINIUse 4 times daily as instructed with Humalog pen.  ICD-9 250.02    Diabetes mellitus, uncontrolled.    * insulin pen needle 31G X 5 MMCommonly known as:  BD ULTRA-FINE PEN   NEEDLE MINIUse nightly as instructed with Lantus pen.  ICD-9 250.02    Diabetes mellitus, uncontrolled.    K-Y LUBRICANT JELLY SENSITIVE GelUse as directed    lancetsUse 4 times daily as instructed for blood glucose testing.  ICD9   250.02; Diabetes mellitus, uncontrolled.    meloxicam 15 MG tabletCommonly known as:  MOBICTake 1 tablet (15 mg   total) by mouth daily   Take with food.    morphine 15 MG 12 hr tabletCommonly known as:  MS CONTINTake 1 tablet (15   mg total) by mouth 2 times daily as needed for Pain   MDD 30 mg    polyvinyl alcohol 1.4 % ophthalmic solutionCommonly known as:  LIQUIFILM   TEARSPlace 1 drop into both eyes as needed for Dry Eyes    rosuvastatin 40 MG tabletCommonly known as:  CRESTORTake 1 tablet (40 mg   total) by mouth daily (with dinner)    sertraline 100 MG tabletCommonly known as:  ZOLOFTTake 1 tablet (100 mg   total) by mouth daily    sodium chloride 0.65 % nasal sprayCommonly known as:  OCEAN1 spray by   Each Nare route as needed for Congestion    starch powder packetCommonly known as:  THICK-IT 2Add to liquid or pureed   foods for thickening as needed.    tiotropium 18 MCG inhalation capsuleCommonly known as:  SPIRIVAInhale 1   capsule (18 mcg total) into the lungs daily    * Notice:  This list has 7 medication(s) that are the same as other   medications prescribed for you. Read the directions carefully, and ask   your doctor or other care provider to review them with you.      STOP taking these medications         amLODIPine 10 MG tabletCommonly known as:  NORVASC    losartan-hydrochlorothiazide 100-25 MG per tabletCommonly known as:    HYZAAR    metoprolol 100 MG 24 hr tabletCommonly known as:   TOPROL-XL    traZODone 100 MG tabletCommonly known as:  DESYREL      Where to Get Your Medications    You need to pick  up these prescriptions. We sent them to a specific   pharmacy, so go there to get them.    [facility] -   [place], [place] - [address]-  insulin glargine 100 UNIT/ML injection   [address] [place] [address]Phone:  [phone]            OTHER PROCEDURES/FINDINGS: Ultrasound doppler LLE 12/08/2012  IMPRESSION:     No DVT in the left lower extremity.  XRAY RESULTS: CXR 12/09/2012  Impression:  No acute pulmonary disease.        Signed: Priscille Kluver, MD  On: 12/11/2012  at: 12:01 PM

## 2012-12-11 NOTE — Discharge Instructions (Addendum)
Brief Summary of Your Hospital Course (including key procedures and diagnostic test results):  On 12/08/2012, you came to [facility] because you had experienced burning, aching pain in both your legs that were so bad that you had difficulty walking. When you were evaluated in the ED, it was discovered that your blood pressure had dropped to very low levels. You were treated with fluids and given pain medicine for your legs. We discovered that you suffered an injury to your kidneys and high blood sugars. After a couple of days, your kidney function improved. You were discharged in stable condition.    Your instructions:   Stop taking all blood pressure medications at this time.  They will be added back slowly over the next several weeks at visits with Dr. Pennelope Bracken 800 at [facility]   Increase Lantus to 60 Units x2 separate sites (120 Units total) in the evening   Stop the trazodone   You may resume your other home medications   Make sure you are drinking plenty of water or non-sugar sweetened fluids   Take your medications as your doctor has prescribed them. They are important for your health and will help keep you from needing to come to the hospital.   Eat a healthy diet by increasing the amount of vegetables you eat and reducing your intake of breads, sweets, and desserts. Avoid chips, soda, sweet juices, burgers, fried foods and other junk food.   Walk at least 30 minutes a day for at least 3 days a week. Try to obtain more exercise if you can tolerate it.    What to do after you leave the hospital:  Follow-up with your primary care doctor, Dr. [name].  Also, see above.    Recommended diet: controlled carbohydrates diet    Recommended activity: activity as tolerated    Wound Care: none needed    If you experience any of these symptoms within the first 24 hours after discharge:Uncontrolled pain, Chest pain, Shortness of breath, Fever of 101 F. or greater, Poor urinary  output, Vomiting, Diarrhea, Blood in stool, Loss of consciousness, New or worsening headache or Weakness  please follow up with the discharge attending Dr. Hurman Horn at phone-number: [phone]    If you experience any of these symptoms 24 hours or more after discharge:Uncontrolled pain, Chest pain, Shortness of breath, Fever of 101 F. or greater, Poor urinary output, Vomiting, Diarrhea, Blood in stool, Loss of consciousness, New or worsening headache or Weakness  please follow up with your PCP:  [name], MD [phone]

## 2012-12-12 ENCOUNTER — Telehealth: Payer: Self-pay

## 2012-12-12 NOTE — Telephone Encounter (Signed)
12/12/12 [facility] nurse called again and left VM. Retook Pt's BP - 150/92 per Pt request.  Pt waiting on prior authorization for Crestor. Pt has faint crackles in lower left base. Junious Dresser also discussing with Pt about using a pharmacy that delivers medications. Wanted Provider aware.

## 2012-12-12 NOTE — Telephone Encounter (Signed)
12/12/12 Connie nurse from [facility] called while visiting Pt. Pt recently released from hospital. During hospital stay Pt had hypotension and was taken off of her hypertension medication. Connie filled mediset today and removed Pt's metoprolol and amlodipine. Pt's BP today was 158/100 sitting, 180/100 standing. Please advise 800 nurse.  Pt scheduled for appt 10/10 at [facility] but may reschedule per Junious Dresser. Thank you.

## 2012-12-13 ENCOUNTER — Ambulatory Visit: Payer: Self-pay | Admitting: Family Medicine

## 2012-12-13 ENCOUNTER — Encounter: Payer: Self-pay | Admitting: Family Medicine

## 2012-12-13 VITALS — BP 138/91 | HR 88 | Temp 98.3°F | Ht 64.17 in | Wt <= 1120 oz

## 2012-12-13 DIAGNOSIS — M1710 Unilateral primary osteoarthritis, unspecified knee: Secondary | ICD-10-CM

## 2012-12-13 DIAGNOSIS — Z79899 Other long term (current) drug therapy: Secondary | ICD-10-CM

## 2012-12-13 DIAGNOSIS — E860 Dehydration: Secondary | ICD-10-CM

## 2012-12-13 DIAGNOSIS — I2510 Atherosclerotic heart disease of native coronary artery without angina pectoris: Secondary | ICD-10-CM

## 2012-12-13 MED ORDER — AMLODIPINE BESYLATE 10 MG PO TABS *I*
10.0000 mg | ORAL_TABLET | Freq: Every day | ORAL | Status: DC
Start: 2012-12-13 — End: 2013-03-04

## 2012-12-13 MED ORDER — ROSUVASTATIN CALCIUM 40 MG PO TABS *I*
40.0000 mg | ORAL_TABLET | Freq: Every day | ORAL | Status: DC
Start: 2012-12-13 — End: 2012-12-27

## 2012-12-13 MED ORDER — MORPHINE SULFATE ER 15 MG PO TBCR *I*
15.0000 mg | ORAL_TABLET | Freq: Two times a day (BID) | ORAL | Status: AC | PRN
Start: 2012-12-13 — End: 2012-12-23

## 2012-12-13 NOTE — Progress Notes (Signed)
[facility] Medicine - Outpatient Progress Note    SUBJECTIVE    CC: Knee pain    HPI:    Has OA. Got a shot in the L knee. It didn't help. Out of Morphine sulfate.     Afraid of having knee surgery. Has been getting Morphine sulfate until a solution can be found.     Has been using the Morphine sulfate-contin Three times a day. Unaware that the script was for BID.     Was told to stop taking BP meds. Was in the hospital last week. Felt poorly and found to be hypotensive in ED, required extensive rehydration. She states that she had not really been eating and drinking that day, wasn't hungry. Had her BP measured, it was too high.      Willing to see Django next week for help with meds. Django will call her on Wed AM to schedule a home visit.    Looking for paperwork for power chair. TC to [facility] during visit, they will fax over paperwork to our office.    Wants to restart BP meds. Discussed that amlodipine might be the best choice for where to start.        History:      1. Controlled substance agreement signed    2. Dehydration    3. DJD (degenerative joint disease) of knee, bilateral    4. Coronary Artery Disease        ROS: as per HPI    Patient's medications, allergies, past medical, surgical, social and family histories were reviewed and updated as appropriate.    OBJECTIVE    Filed Vitals:    12/13/12 1028   BP: 138/91   Pulse: 88   Temp: 36.8 C (98.3 F)   Height: 1.63 m (5' 4.17")   Weight: 0.045 kg (1.6 oz)     General: Oriented to person, place, and time. Appears well-developed and well-nourished. No distress.   Eyes: Conjunctivae,external lids, and EOM are normal. Pupils are equal, round.   Psychiatric: Normal mood and affect. Behavior is normal. Judgment and thought content normal.     ASSESSMENT & PLAN    DJD (degenerative joint disease) of knee, bilateral  A: Still trying to get wheelchair. Temporizing with Morphine sulfate-contin.   P: Refilled 10 days, PCP will determine long term  dosing or non-dosing. Spoke to [employer] wheelchair.     Dehydration  A/P: Was admitted recently for dehydration and AKI. Symptoms and labs all normalized with administration of fluids. Has been off of BP meds until now, but home nurse is becoming concerned re HTN. Will restart amlodipine, and add other meds if necessary at a later date. F/u with CCP in 2 weeks.         1. Controlled substance agreement signed     2. Dehydration  amLODIPine (NORVASC) 10 MG tablet   3. DJD (degenerative joint disease) of knee, bilateral  morphine (MS CONTIN) 15 MG 12 hr tablet   4. Coronary Artery Disease  rosuvastatin (CRESTOR) 40 MG tablet       Rondel Baton, MD  10:41 AM  12/13/2012      -------------------------------------      --------------------    Preceptor Attestation - [facility] Medicine     I have reviewed the history, exam findings, and plan of care and discussed the care plan with the resident at the time of the visit. I agree with the resident's findings and plan of care as documented above.  F/U hospital stay, had hypotensive episode.   Restart amlodipine.  Has home nursing for BP checks.     HOLLY ANN RUSSELL, MD

## 2012-12-13 NOTE — Assessment & Plan Note (Signed)
A/P: Was admitted recently for dehydration and AKI. Symptoms and labs all normalized with administration of fluids. Has been off of BP meds until now, but home nurse is becoming concerned re HTN. Will restart amlodipine, and add other meds if necessary at a later date. F/u with CCP in 2 weeks.

## 2012-12-13 NOTE — Assessment & Plan Note (Signed)
A: Still trying to get wheelchair. Temporizing with Morphine sulfate-contin.   P: Refilled 10 days, PCP will determine long term dosing or non-dosing. Spoke to [employer] wheelchair.

## 2012-12-16 LAB — EKG 12-LEAD
P: 64 degrees
P: 59 degrees
QRS: 29 degrees
QRS: 7 degrees
Rate: 87 {beats}/min
Rate: 85 {beats}/min
Severity: BORDERLINE
Severity: BORDERLINE
Severity: ABNORMAL
Severity: ABNORMAL
Statement: BORDERLINE
Statement: BORDERLINE
Statement: BORDERLINE
T: 150 degrees
T: 45 degrees

## 2012-12-17 NOTE — Progress Notes (Signed)
12/17/12 - Put prior authorization form for Crestor 40 mg tablets in Dr. Wallie Renshaw box for completion

## 2012-12-18 ENCOUNTER — Other Ambulatory Visit: Payer: Self-pay | Admitting: Family Medicine

## 2012-12-18 ENCOUNTER — Telehealth: Payer: Self-pay

## 2012-12-18 NOTE — Telephone Encounter (Signed)
Call stating,Patient requesting meals on wheels,complete home care ,they are requesting a medical and social evaluation for patient,stating this has been requested before

## 2012-12-19 ENCOUNTER — Telehealth: Payer: Self-pay | Admitting: Licensed Practical Nurse

## 2012-12-19 NOTE — Telephone Encounter (Signed)
Writer called pt, LMOVM requesting call back to discuss scheduling a home visit for med rec as we previously discussed at her last visit.

## 2012-12-20 ENCOUNTER — Telehealth: Payer: Self-pay

## 2012-12-20 ENCOUNTER — Other Ambulatory Visit: Payer: Self-pay | Admitting: Family Medicine

## 2012-12-20 NOTE — Telephone Encounter (Signed)
12/20/12 Gloria Ray Pt's HCR nurse called. Pt does not have her Crestor medication (awaiting prior authorization). HCR nursing will be visiting Pt once a week with prn visits (Pt has been having 2 visits per week.) Wanted Provider aware.

## 2012-12-21 ENCOUNTER — Telehealth: Payer: Self-pay | Admitting: Family Medicine

## 2012-12-21 NOTE — Telephone Encounter (Signed)
[facility] Family Medicine: After Hours / On-Call Note    Date of Call: 12/21/2012  Time of Call: 5:13 PM     CCP:  Juliane Poot    Message: HCR nurse calling to report two elevated BPs today, 160/90 and 181/105.  Pt without symptoms at home.  These reading were taken by the patient at home, who reports that she is having trouble using the cuff    Impression: Hypertension      Disposition/Plan:  Plan for [facility] nurse to go to patient house tomorrow to manually check the BP given the patient is having difficulty with the cuff.  If BP remains elevated, HCR was advised to make an appointment in the office for the patient.      Pollie Meyer, MD on 12/21/2012 at 5:13 PM

## 2012-12-23 ENCOUNTER — Telehealth: Payer: Self-pay

## 2012-12-23 NOTE — Telephone Encounter (Signed)
Pt BP has normalized and was 143/88 HR 88 O2 96%. Pt had a 6lb wt loss from Saturday into Sunday s/p a very large BM. Pt had been constipated for 4 days and finally went. From yesterday into today pt had an unexplained 3.1 lb increase. Pt has no complaints of SOB or edema anywhere. Will continue to monitor. Please notify [id number] if there are any instructions to be passed on to pt. Thank You!!!

## 2012-12-24 NOTE — Progress Notes (Signed)
12/24/12 - Sent a message to Dr. Juliane Poot to follow up on the status of the prior authorization.

## 2012-12-25 ENCOUNTER — Ambulatory Visit: Payer: Self-pay | Admitting: Family Medicine

## 2012-12-25 ENCOUNTER — Telehealth: Payer: Self-pay

## 2012-12-25 NOTE — Telephone Encounter (Signed)
TC from [place], [place], reporting that pt has been opened to PT.  Caller also reported that pt fell yesterday, hitting her head.  Pt did c/o slight headache, there were no cuts or abrasions, there is a bump, no report of LOC.  Pt BP 160/92 sit and 150/90 standing.  NO report of HR.  Pt does c/o low back pain 9/10 at beginning of visit and 5/10 at end of visit.    Called pt, no answer, planned to ask pt about headache, dizziness, or any other symtpoms r/t to fall, left voicemail asking pt to return the call to this office.  Please advise

## 2012-12-25 NOTE — Telephone Encounter (Signed)
Reporting 3.6lb increase. Pt denies SOB,edema, or CP.Pt IS experiencing continued constipation.

## 2012-12-25 NOTE — Telephone Encounter (Signed)
Pt experiencing some COPD exacerbation and restarted some of her PRN Prednisone. She is also still awaiting word on the approval of her Crestor. Pt is very nervous about this since she had the CVA. HCR has discussed with pt getting her medications set up as medi-sets through the pharmacy [facility] as eventually ins will D/C her home nursing visits. Pt is amenable to this.

## 2012-12-26 ENCOUNTER — Telehealth: Payer: Self-pay

## 2012-12-26 NOTE — Telephone Encounter (Signed)
Pt has Dr's appt sched Thurs and Fri and therefore cannot be evaluated for OT until Monday 10/27

## 2012-12-27 ENCOUNTER — Encounter: Payer: Self-pay | Admitting: Family Medicine

## 2012-12-27 ENCOUNTER — Encounter: Payer: Self-pay | Admitting: Gastroenterology

## 2012-12-27 ENCOUNTER — Ambulatory Visit: Payer: Self-pay | Admitting: Family Medicine

## 2012-12-27 VITALS — Temp 98.3°F | Ht 64.17 in | Wt 235.0 lb

## 2012-12-27 DIAGNOSIS — IMO0001 Reserved for inherently not codable concepts without codable children: Secondary | ICD-10-CM

## 2012-12-27 DIAGNOSIS — Z23 Encounter for immunization: Secondary | ICD-10-CM

## 2012-12-27 DIAGNOSIS — M1710 Unilateral primary osteoarthritis, unspecified knee: Secondary | ICD-10-CM

## 2012-12-27 DIAGNOSIS — J449 Chronic obstructive pulmonary disease, unspecified: Secondary | ICD-10-CM

## 2012-12-27 MED ORDER — MORPHINE SULFATE ER 15 MG PO TBCR *I*
15.0000 mg | ORAL_TABLET | Freq: Three times a day (TID) | ORAL | Status: DC | PRN
Start: 2012-12-27 — End: 2013-01-24

## 2012-12-27 MED ORDER — ATORVASTATIN CALCIUM 80 MG PO TABS *I*
80.0000 mg | ORAL_TABLET | Freq: Every day | ORAL | Status: DC
Start: 2012-12-27 — End: 2013-09-18

## 2012-12-27 MED ORDER — METFORMIN HCL 500 MG PO TB24 *I*
500.0000 mg | ORAL_TABLET | Freq: Every day | ORAL | Status: AC
Start: 2012-12-27 — End: 2013-06-25

## 2012-12-27 MED ORDER — ALBUTEROL SULFATE HFA 108 (90 BASE) MCG/ACT IN AERS *I*
2.0000 | INHALATION_SPRAY | RESPIRATORY_TRACT | Status: DC | PRN
Start: 2012-12-27 — End: 2013-07-31

## 2012-12-27 NOTE — Telephone Encounter (Signed)
Gloria Ray wants Dr to be awae that an order was faxed in error requesting Dr to sign off on PT visit 10/24. Pt had a dental appt and was actually to be evaluated 10/27. Sorry for any inconvenience.

## 2012-12-27 NOTE — Telephone Encounter (Signed)
Gloria Ray wants Dr to be aware that pt has had a weight gain of 6.4 lbs in 24 hours and a 5 day total weight gain of 9.2 lbs. Pt BP 10/24: 185/98, but pt had NOT taken BP meds d/t being NPO for dental appt for tooth extraction. Pt reports that she had been living on hot dogs for the last 5 days as she has no funds for food. HCR is attempting to get the pt emergency food help at this time until the first of the month. Please advise [id number] of any advice needing to be passed to pt re: weight gain/elevated BP. Thank You!!!

## 2012-12-27 NOTE — Progress Notes (Signed)
Assessment & Plan        Gloria Ray is a 57 y.o. female and  has a past medical history of Asthma; COPD (chronic obstructive pulmonary disease); Coronary artery disease; Diabetes mellitus; Hypertension; Ischemic Stroke (03/06/2006); Prior Myocardial Infarction (03/19/2007); Unspecified cerebral artery occlusion with cerebral infarction; and Arthritis.       The patient evaluated today for: Follow-up, Diabetes and Medication Problem/Question      Type II, uncontrolled  - metFORMIN (GLUCOPHAGE-XR) 500 MG 24 hr tablet; Take 1 tablet (500 mg total) by mouth daily (with dinner)   Swallow whole. Do not crush, break, or chew.  -     Increase Lantus 2 Units daily until fasting BG is <180, nursing may assist  - atorvastatin (LIPITOR) 80 MG tablet; Take 1 tablet (80 mg total) by mouth daily (with dinner)    DJD (degenerative joint disease) of knee, bilateral  - Short course of opiate analgesia to augment PT and increase mobility/function.  Taper over 3-28mo.  -     Return to PT, coming to house starting 12/28/2012  -     Letter of necessity/coordinate delivery of electric scooter for improved function and independent living    COPD (chronic obstructive pulmonary disease).  Well controlled.  Refilling meds.  - albuterol (PROVENTIL, VENTOLIN, PROAIR HFA) 108 (90 BASE) MCG/ACT inhaler; Inhale 2 puffs into the lungs every 4 hours as needed for Wheezing or Shortness of Breath   Shake well before each use.         Subjective                      Chief Complaint: Follow-up, Diabetes and Medication Problem/Question    HPI  Type II, uncontrolled       Patient would like to have metformin added to regimen, having trouble controlling BGs at home.  Willing to continue to titrate up Lantus 2 Units per day until fasting is <180.  Denies headache, vision changes, excessive thirst, increased urination, nausea, vomiting, chest pain, palpitations, SOB, dizziness, loss of consciousness or loss of motor function.    DJD (degenerative joint  disease) of knee, bilateral       Requesting to restart morphine, is restarting physical therapy and intends to f/u with Ortho to explore knee replacement.  PT coming to house tomorrow.  Needs letter of necessity for power scooter.  Unable to get to store to buy groceries without as has to stop every 50' due to knee pain and pulmonary status.    COPD (chronic obstructive pulmonary disease)       Feels sx are well controlled, notes would have been in hospital by this time other years.    History       Reviewed and updated where appropriate based on HPI and patient report.      ROS       Pertinent positives and negatives of a 12-point review of systems noted above.       Objective   Recorded Vital Signs:             Temp(Src) 36.8 C (98.3 F) (Temporal)  Ht 1.63 m (5' 4.17")  Wt 106.595 kg (235 lb)  BMI 40.12 kg/m2               Wt Readings from Last 7 Encounters:   12/27/12 106.595 kg (235 lb)   12/13/12 0.045 kg (1.6 oz)   12/08/12 107.8 kg (237 lb 10.5 oz)   11/13/12  109.77 kg (242 lb)   10/31/12 0.045 kg (1.6 oz)   09/27/12 108.863 kg (240 lb)   08/21/12 102.967 kg (227 lb)                   Physical Exam   VITALS: Reviewed above and WNL.  BMI category is morbid obesity, recent weight trend is down.  GEN: Well developed, well nourished, appears older than stated age, no acute distress  HEENT: Atraumatic.  EOM normal, conjunctiva clear, no blepharitis.  RESP: Baseline breathless appearance.  Lungs are clear to auscultation bilaterally with good effort.  No crackles, rales, or wheezes appreciated.  Intermittent volitional wheeze noted.  CV: Regular rhythm, S1, S2.  No S3, S4, murmurs or rubs.    EXT: Warm and well perfused.  No edema.   SKIN: Warm and dry. No rashes, ecchymoses, or petechiae of exposed surfaces  NEURO:  Alert.  Cranial nerves grossly intact.  Strength is normal and symmetric.  Gait is abnormal, slow, appears in pain.  Balance is abnormal, using cane.    PSYCH:  Patient well dressed and well  groomed, sitting in chair.  Speech is normal.  Mood is "good".  Affect is congruent with mood.  Thought content is linear with no expressed paranoia, delusions, or hallucinations.  Judgement is fair.  Insight is fair.        Note in APSO format, see top for Assessment & Plan.     [name] MD, PhD   R3 [facility]   12/27/2012  3:49 PM       --------------------    Preceptor Attestation     I saw and evaluated the patient, and discussed the care with the resident. I agree with the resident's/fellow's findings and plan of care as documented above. Details of my evaluation are as follows:   S: COPD  DM, uncontrolled  DJD  O: pleasant NAD  A/P: inc lantus  Close f/up  Cont other meds    [name], MD, MPH  [facility] Medicine Attending  5:25 PM

## 2012-12-28 ENCOUNTER — Encounter: Payer: Self-pay | Admitting: Family Medicine

## 2012-12-30 ENCOUNTER — Encounter: Payer: Self-pay | Admitting: Licensed Practical Nurse

## 2012-12-30 ENCOUNTER — Telehealth: Payer: Self-pay

## 2012-12-30 NOTE — Progress Notes (Signed)
12/30/12 - Dr. Juliane Poot prescribed an alternative medication, no PA needed.

## 2012-12-30 NOTE — Telephone Encounter (Signed)
Pt 's weight seems to fluctuate daily by 3-5lbs. HCR is asking if it's possible toi change the call-in parameters to call for 5lb or more increase/decrease?  Please advise [id number] pool so pt can be notified. Thank You!!!    Also, pt c/o constipation and tells HCR she takes Colace 100mg  BID. Med list shows Colace 100mg  daily, but script is from 08/2013 w/1 refill, so not sure if that is a change that hasn't been recorded.     Lastly, Pt BP's seem to be higher on the weekends, and HCR feels that pt may be fudging the time she is taking her meds on weekend, or if she takes them at all. This is just an [place].

## 2012-12-31 ENCOUNTER — Telehealth: Payer: Self-pay

## 2012-12-31 ENCOUNTER — Other Ambulatory Visit: Payer: Self-pay | Admitting: Family Medicine

## 2012-12-31 NOTE — Telephone Encounter (Signed)
Pt opened to SW

## 2012-12-31 NOTE — Telephone Encounter (Signed)
Pt BP: 178/88. Pt has been consistently running high for a while now. Does Dr want to make any changes to pt medication? Or does Dr want to make any changes to pt's call parameters?  Please advise [id number] pool so HCR can be notified. Thank You!!!

## 2012-12-31 NOTE — Telephone Encounter (Signed)
Pt opened to OT

## 2012-12-31 NOTE — Telephone Encounter (Signed)
Pt still needing motorized Wheel chair. RN printed forms done in 8 & 11/2012 and will call company re: this issue

## 2013-01-01 ENCOUNTER — Telehealth: Payer: Self-pay

## 2013-01-01 NOTE — Telephone Encounter (Signed)
01/01/13 9:40AM BP: 179-110 HR: 91 O2: 91%  WT: 231-2. Pt rechecked 11:30AM BP: 161/90 HR: 96 O2: 92% Pt asymptomatic denies CP, SOB, HA or dizziness. Please advise if pt needs notifiecation of any changes or suggestions. Thank You!!!

## 2013-01-04 ENCOUNTER — Ambulatory Visit
Admit: 2013-01-04 | Discharge: 2013-02-02 | Disposition: A | Discharge status: Home / Self Care | Payer: Self-pay | Source: Ambulatory Visit | Admitting: Psychiatry

## 2013-01-07 ENCOUNTER — Other Ambulatory Visit: Payer: Self-pay

## 2013-01-07 ENCOUNTER — Telehealth: Payer: Self-pay

## 2013-01-07 DIAGNOSIS — E162 Hypoglycemia, unspecified: Secondary | ICD-10-CM

## 2013-01-07 NOTE — Telephone Encounter (Signed)
HCR has been able to get pt meds changed to a Q2 week refill (should appease ins co) and also, pt took Metformin twice and her BG plummeted to 50. She was able to get it back up and was asymptomatic, but she is now refusing to take Metformin.  Please advise [id number] pool so pt can be notified of any instructions. Thank You!!!

## 2013-01-08 ENCOUNTER — Telehealth: Payer: Self-pay

## 2013-01-08 MED ORDER — GLUCOSE 40 % PO GEL *I*
15.0000 g | Freq: Once | ORAL | Status: AC | PRN
Start: 2013-01-07 — End: ?

## 2013-01-08 NOTE — Telephone Encounter (Signed)
Changing the frequency of PT visits per pt request. Will see pt again next week.

## 2013-01-08 NOTE — Telephone Encounter (Signed)
HCR-Jeremy,PT(student ) called to report pt's b/p via tele-health was 175/90 p 101 and manually about the same. Pt. had not taken her daily Norvasc before these reading, but has since. Denied symptoms-chest pain,diaphoresis, HA,dizziness, confusion etc.  Will recheck her b/p later today.  Thanks.

## 2013-01-09 ENCOUNTER — Telehealth: Payer: Self-pay

## 2013-01-09 NOTE — Telephone Encounter (Addendum)
11/6 10:10AM vitals: wt: 229.6 (stable) BP: 157/97 HR: 89 O2: 91%. Pt c/o SOB, even at rest, inability to complete sentences, unable to lay down and needing extra pillows for elevation to sleep. Pt stated she was about to do a neb tx and RN called back 30 minutes later. Pt sounded much better over phone after tx, pt stated she felt 50% better and would do another tx in 4 hours. BP at 11:52 was 170/94 HR: 96 O2: 90%. Pt c/o HA, but other sxs seem to have resolved since neb tx. Pt denied needing a nurse visit from HCR. Pt will transmit again 11/7. Left message for pt to call ste800RN if she was still feeling poorly     HCR called back after checking with pt who sounds more normal and is feeling fine. Pt knows to call if she feels it's necessary.

## 2013-01-10 ENCOUNTER — Telehealth: Payer: Self-pay

## 2013-01-10 DIAGNOSIS — J449 Chronic obstructive pulmonary disease, unspecified: Secondary | ICD-10-CM

## 2013-01-10 DIAGNOSIS — M1710 Unilateral primary osteoarthritis, unspecified knee: Secondary | ICD-10-CM

## 2013-01-10 MED ORDER — WHEELCHAIR MISC *A*
Status: DC
Start: 2013-01-10 — End: 2013-01-24

## 2013-01-10 NOTE — Telephone Encounter (Signed)
[facility] is still awaiting a chart note OR Letter of Medical Necessity that is signed by both CCP & PCP (Dr. Jeanella Craze signed the orders that were faxed earlier) in order to continue the process of getting the pt her chair. They have repeatedly requested this and have yet to receive it. A release of info allowing it to be faxed should be on file. They should be faxed to [phone] attn: Noris as soon as possible, please. Thank You!!!

## 2013-01-14 ENCOUNTER — Telehealth: Payer: Self-pay

## 2013-01-14 ENCOUNTER — Encounter: Payer: Self-pay | Admitting: Gastroenterology

## 2013-01-14 NOTE — Telephone Encounter (Signed)
sejnding orders for signature for one extra nursing visit to discuss UAS, long term care, etc.

## 2013-01-16 ENCOUNTER — Telehealth: Payer: Self-pay

## 2013-01-16 ENCOUNTER — Other Ambulatory Visit: Payer: Self-pay | Admitting: Family Medicine

## 2013-01-16 NOTE — Telephone Encounter (Signed)
Has met physical therapy goals, is be discharged.

## 2013-01-17 MED ORDER — DOCUSATE SODIUM 100 MG PO CAPS *I*
100.0000 mg | ORAL_CAPSULE | Freq: Every day | ORAL | Status: DC
Start: 2013-01-17 — End: 2013-02-18

## 2013-01-20 ENCOUNTER — Telehealth: Payer: Self-pay

## 2013-01-20 NOTE — Telephone Encounter (Signed)
Pt BP continuing to be elevated. 11/17 BP: 171/99. Pt denies sx other than some tiredness and intermittent HA. Pt has appt sched for 11/21. Pt c/o bilat leg pain 8-9/10. Asking if a script for pain meds can be done, please.  Please advise [id number] pool so pt can be notified. Thank You!!!

## 2013-01-20 NOTE — Telephone Encounter (Signed)
Patient came in to day asking to speak to ou about her knee pain, but you had already left for the day. Asking to have you call her about this.

## 2013-01-20 NOTE — Telephone Encounter (Signed)
01/17/13 Vernona Rieger, from [facility] home Telehealth called. Pt's BP was 164/91 with a HR of 91. Vernona Rieger talked with Pt twice and Pt states she is feeling fine. (Pt did state that earlier in week her hearing sounded as if she had a seashell against her ears.) Wanted Provider aware.

## 2013-01-21 ENCOUNTER — Telehealth: Payer: Self-pay

## 2013-01-21 NOTE — Telephone Encounter (Addendum)
Pt still c/o bi-lat knee pain 10/10. Has 11/21 appt sched, but eager for some pain relief, please. The Tylenol is NOT helping at all     Also, pt STILL refusing to take Metformin as she feels it makes her BG go too low, though she had no BG numbers to provide.     Please advise [email or web address] pool so pt can be notified of any advice/response. Thank You!!!

## 2013-01-22 ENCOUNTER — Encounter: Payer: Self-pay | Admitting: Gastroenterology

## 2013-01-23 ENCOUNTER — Telehealth: Payer: Self-pay

## 2013-01-23 NOTE — Telephone Encounter (Signed)
Pt has been experiencing a weight gain over last several days. 11/15: 224lbs; 11/17: 226.1lbs; 11/20: 231.2lbs. Pts states she is experiencing some abdominal bloat, but denies and lower extremity edema or SOB. Pt has 11/21 appt w/Dr. Juliane Poot.

## 2013-01-24 ENCOUNTER — Ambulatory Visit: Payer: Self-pay | Admitting: Family Medicine

## 2013-01-24 ENCOUNTER — Encounter: Payer: Self-pay | Admitting: Gastroenterology

## 2013-01-24 ENCOUNTER — Encounter: Payer: Self-pay | Admitting: Family Medicine

## 2013-01-24 VITALS — BP 160/88 | HR 97 | Temp 98.6°F | Ht 64.17 in | Wt 233.0 lb

## 2013-01-24 DIAGNOSIS — I10 Essential (primary) hypertension: Secondary | ICD-10-CM

## 2013-01-24 DIAGNOSIS — IMO0001 Reserved for inherently not codable concepts without codable children: Secondary | ICD-10-CM

## 2013-01-24 MED ORDER — METOPROLOL SUCCINATE 100 MG PO TB24 *I*
100.0000 mg | ORAL_TABLET | Freq: Every day | ORAL | Status: DC
Start: 2013-01-24 — End: 2013-09-18

## 2013-01-24 MED ORDER — MORPHINE SULFATE ER 15 MG PO TBCR *I*
15.0000 mg | ORAL_TABLET | Freq: Three times a day (TID) | ORAL | Status: DC | PRN
Start: 2013-01-24 — End: 2013-01-24

## 2013-01-24 MED ORDER — WHEELCHAIR MISC *A*
Status: AC
Start: 2013-01-24 — End: ?

## 2013-01-24 MED ORDER — MORPHINE SULFATE ER 15 MG PO TBCR *I*
15.0000 mg | ORAL_TABLET | Freq: Three times a day (TID) | ORAL | Status: DC | PRN
Start: 2013-02-23 — End: 2013-03-19

## 2013-01-24 MED ORDER — LOSARTAN POTASSIUM-HCTZ 100-25 MG PO TABS *A*
1.0000 | ORAL_TABLET | Freq: Every morning | ORAL | Status: DC
Start: 2013-01-24 — End: 2013-07-07

## 2013-01-24 MED ORDER — IPRATROPIUM-ALBUTEROL 0.5-2.5 MG/3ML IN SOLN *I*
3.0000 mL | Freq: Four times a day (QID) | RESPIRATORY_TRACT | Status: DC
Start: 2013-01-24 — End: 2013-08-04

## 2013-01-24 NOTE — Progress Notes (Addendum)
Assessment & Plan        Gloria Ray is a 57 y.o. female and  has a past medical history of Asthma; COPD (chronic obstructive pulmonary disease); Coronary artery disease; Diabetes mellitus; Hypertension; Ischemic Stroke (03/06/2006); Prior Myocardial Infarction (03/19/2007); Unspecified cerebral artery occlusion with cerebral infarction; and Arthritis.       The patient evaluated today for: Knee Pain      Hypertension.  Uncontrolled, anticipate better control with adding back meds stopped for AKI, hypotension.  - losartan-hydrochlorothiazide (HYZAAR) 100-25 MG per tablet; Take 1 tablet by mouth every morning  - metoprolol (TOPROL-XL) 100 MG 24 hr tablet; Take 1 tablet (100 mg total) by mouth daily    Type II, uncontrolled  -     Due for labs, diabetes HM  -     Return ASAP for comprehensive DM assessment and labs    DJD (degenerative joint disease) of knee, bilateral  - morphine (MS CONTIN) 15 MG 12 hr tablet; Take 1 tablet (15 mg total) by mouth 3 times daily as needed for Severe Pain   Max daily dose: 45 mg  - wheelchair; Dispense 1 motorized scooter/wheelchair  -     Letter documenting medical need for above    COPD (chronic obstructive pulmonary disease).  Well controlled  - ipratropium-albuterol (DUONEB) 0.5-2.5 (3) MG/3ML nebulizer solution; Take 3 mLs by nebulization 4 times daily         Subjective                      Chief Complaint: Knee Pain    HPI  Hypertension       Not on full HTN regimen s/p hospitalization, continues to have high BPs per home nursing.  Denies headache, vision changes, chest pain, palpitations, SOB, dizziness, loss of consciousness or loss of motor function.     Type II, uncontrolled         Patient reports taking Lantus as prescribed 7/7 days last week and missing 0 times last month, assisted by home nursing.  No adverse medication effects at this time, notes 0 episodes of hypoglycemia.  Is checking blood glucose outside of the office though not as regularly as should be.   Denies headache, vision changes, excessive thirst, increased urination, nausea, vomiting, chest pain, palpitations, SOB, dizziness, loss of consciousness or loss of motor function.     DJD (degenerative joint disease) of knee, bilateral       Doing physical therapy 2x per week.  Willing to see Ortho, requesting restart of morphine Rx for function.  Requesting to re-address electronic wheelchair/scooter.  Needs to be able to get groceries independently, she has no help from family and has food insecurity.  Tried to walk to store but had to stop many times, almost was stranded.    COPD (chronic obstructive pulmonary disease)       Doing very well, notes that she would have been in the hospital by this time last year.  Has plan for exacerbations.  No current SOB, baseline breathlessness with talking, exertion.  Breathing issues combined with DJD are debilitating.  Needs refill of nebs.    History       Reviewed and updated where appropriate based on HPI and patient report.      ROS       Pertinent positives and negatives of a focused review of systems noted above.       Objective   Recorded Vital Signs:  BP 160/88  Pulse 97  Temp(Src) 37 C (98.6 F) (Temporal)  Ht 1.63 m (5' 4.17")  Wt 105.688 kg (233 lb)  BMI 39.78 kg/m2               Wt Readings from Last 7 Encounters:   01/24/13 105.688 kg (233 lb)   12/27/12 106.595 kg (235 lb)   12/13/12 0.045 kg (1.6 oz)   12/08/12 107.8 kg (237 lb 10.5 oz)   11/13/12 109.77 kg (242 lb)   10/31/12 0.045 kg (1.6 oz)   09/27/12 108.863 kg (240 lb)                   Physical Exam   VITALS: Reviewed above and elevated BP.  BMI category is moderate, borderline morbid, obesity.  GEN: Well developed, well nourished, appears stated age, no acute distress  HEENT: Atraumatic.  EOM normal, conjunctiva clear, no blepharitis.  Moist mucus membranes, no oropharyngeal lesions.  Trachea midline, no lymphadenopathy or thyromegaly.  RESP: Mildly labored breaths, baseline.  Lungs  are clear to auscultation bilaterally with good effort.  No crackles, rales, or wheezes appreciated.  CV: Regular rhythm, S1, S2.  No S3, S4, murmurs or rubs.   ABD: Bowel sounds normal.  Soft, non-tender, non-distended.   EXT: Warm and well perfused.  No edema.   SKIN: Warm and dry. No rashes, ecchymoses, or petechiae of exposed surfaces  NEURO:  Alert.  Cranial nerves grossly intact.  Strength is normal and symmetric.  Gait is abnormal, antalgic, slowed, heavily assisted by cane.  Balance is abnormal.           Note in APSO format, see top for Assessment & Plan.     [name] MD, PhD   R3 [facility]   01/24/2013  3:24 PM       --------------------    Preceptor Attestation - Patient Seen    I saw and evaluated the patient. I agree with the resident's/fellow's findings and plan of care as documented above.     Elevated blood pressure off 2 of her usual meds for unclear reasons.  Will restart.  Diabetes poorly controlled historically.  Provided encouragement and reviewed plan.  Osteoarthritis treated with PT and analgesics.    JEFFREY Darlin Coco, MD

## 2013-01-29 ENCOUNTER — Telehealth: Payer: Self-pay

## 2013-01-29 NOTE — Telephone Encounter (Signed)
Called and spoke with [facility] Chair regarding Gloria Ray's request. They are ion need of chart notes for her along with the letter that you wrote on 01/24/2013. Have you seen this request?

## 2013-02-03 ENCOUNTER — Ambulatory Visit
Admit: 2013-02-03 | Discharge: 2013-03-05 | Disposition: A | Discharge status: Home / Self Care | Payer: Self-pay | Source: Ambulatory Visit | Attending: Psychiatry | Admitting: Psychiatry

## 2013-02-04 ENCOUNTER — Telehealth: Payer: Self-pay | Admitting: Licensed Practical Nurse

## 2013-02-04 ENCOUNTER — Other Ambulatory Visit: Payer: Self-pay | Admitting: Family Medicine

## 2013-02-04 NOTE — Telephone Encounter (Signed)
[employer] to verify that all they need is the chart notes and letter of medical necessity to process the authorization request for power chair from pt's insurance. Confirmed that is all they are waiting for. Printed and faxed progress notes from 01/24/13 visit with Dr. Juliane Poot and the letter written on that date. Fax confirmation received.

## 2013-02-05 ENCOUNTER — Telehealth: Payer: Self-pay

## 2013-02-05 NOTE — Telephone Encounter (Signed)
Pt misplaced Hyzaar and Metoprolol prescribed at last visit and had not been taking them. The [facility] RN has subsequently founf them and pt will begin taking them now.updated med list being faxed.

## 2013-02-06 ENCOUNTER — Other Ambulatory Visit: Payer: Self-pay | Admitting: Family Medicine

## 2013-02-06 ENCOUNTER — Telehealth: Payer: Self-pay

## 2013-02-06 NOTE — Telephone Encounter (Signed)
02/06/13 10AM pt BP: 153/93 HR: 79 wt: 224.9. Pt had NOT taken meds at time of vitals. Pt asymptomatic. RN to check on pt tomorrow.

## 2013-02-10 ENCOUNTER — Telehealth: Payer: Self-pay

## 2013-02-10 NOTE — Telephone Encounter (Signed)
Patient called in requesting a new script for a nebulizer, the old one broke, she goes to medical supply store the number is [phone].

## 2013-02-17 ENCOUNTER — Telehealth: Payer: Self-pay

## 2013-02-17 DIAGNOSIS — J449 Chronic obstructive pulmonary disease, unspecified: Secondary | ICD-10-CM

## 2013-02-17 MED ORDER — NEBULIZER DEVICE *A*
Status: AC
Start: 2013-02-17 — End: ?

## 2013-02-17 NOTE — Telephone Encounter (Signed)
Pt's old nebulizer is broken and request for script for new nebulizer being requested, please.  Please advise [id number] so HCR can be notified. Thank You!!!

## 2013-02-18 ENCOUNTER — Telehealth: Payer: Self-pay | Admitting: Licensed Practical Nurse

## 2013-02-18 ENCOUNTER — Other Ambulatory Visit: Payer: Self-pay | Admitting: Family Medicine

## 2013-02-18 DIAGNOSIS — J449 Chronic obstructive pulmonary disease, unspecified: Secondary | ICD-10-CM

## 2013-02-18 NOTE — Telephone Encounter (Signed)
HCR-Jackie called, says she saw pt.today, noted rhonchi with diminished lobes along with chest tightness. Pt.had a neb treatment, but in this situation she benefits from prednisone and mucinex.  Requesting an order for these items. Thanks.

## 2013-02-18 NOTE — Telephone Encounter (Signed)
HCR notified that [facility] would be delivering a new nebulizer to pt either 12/16 or 12/17

## 2013-02-18 NOTE — Telephone Encounter (Signed)
Writer ordered nebulizer machine and equipment (mask, compressor, etc) for pt. Spoke with Adela Lank at [facility], neb should be delivered today.

## 2013-02-19 ENCOUNTER — Telehealth: Payer: Self-pay

## 2013-02-19 MED ORDER — PREDNISONE 20 MG PO TABS *I*
40.0000 mg | ORAL_TABLET | Freq: Every day | ORAL | Status: DC | PRN
Start: 2013-02-19 — End: 2013-03-19

## 2013-02-19 MED ORDER — GUAIFENESIN 600 MG PO TB12 *I*
1200.0000 mg | ORAL_TABLET | Freq: Two times a day (BID) | ORAL | Status: AC
Start: 2013-02-19 — End: 2013-02-26

## 2013-02-19 NOTE — Telephone Encounter (Addendum)
Concerned about pt weight increase. 12/10 pt weight: 223.2 12/17: 230.5. Pt unclear about when last BM was. Pt does have issues with constipation. Pt states she has been taking her prescribed medication for constipation and is drinking plenty of water as well as using dietary and coffee means to keep things moving. Fax with weights to follow. Please advise if there any instructions to be passed to pt or RN. Thank You!!!

## 2013-02-19 NOTE — Addendum Note (Signed)
Addended byEverlean Patterson on: 02/19/2013 12:33 PM     Modules accepted: Orders, Medications

## 2013-02-19 NOTE — Telephone Encounter (Signed)
Called HCR-Jackie with information about the meds ordered, left verbal order to call office. Thanks.

## 2013-02-24 ENCOUNTER — Telehealth: Payer: Self-pay

## 2013-02-24 NOTE — Telephone Encounter (Signed)
TC from [place] @ [facility] reporting 12/20, vitals transmitted were BP 130/93, HR of 88 02 sat 94%.  Pt did not transmit on 12/21.  Today BP 155/91 HR 93, 02 sat 95%.  Weight 229, pt has c/o headache for the past couple of days.  TC to pt, no answer, voicemail full, no able to leave a message.

## 2013-02-26 ENCOUNTER — Telehealth: Payer: Self-pay

## 2013-02-26 NOTE — Telephone Encounter (Signed)
Sorry wght off only 229,sorry

## 2013-02-26 NOTE — Telephone Encounter (Signed)
Cough and cold symptoms,afebril,WGHT 2229, B/P 152/89, hr 84., Per homecare over the counter medication s for flu like symptoms,if patient becomes febrile or worsens ,go to immediate care or  HH,ED.Marland Kitchen

## 2013-03-01 ENCOUNTER — Emergency Department: Admit: 2013-03-01 | Disposition: A | Discharge status: Home / Self Care | Payer: Self-pay | Source: Ambulatory Visit

## 2013-03-01 ENCOUNTER — Other Ambulatory Visit: Payer: Self-pay | Admitting: Gastroenterology

## 2013-03-01 ENCOUNTER — Encounter: Payer: Self-pay | Admitting: Internal Medicine

## 2013-03-01 LAB — INFLUENZA A & B PCR: Influenza A & B PCR: 0

## 2013-03-01 MED ORDER — ALBUTEROL SULFATE (2.5 MG/3ML) 0.083% IN NEBU *I*
INHALATION_SOLUTION | RESPIRATORY_TRACT | Status: AC
Start: 2013-03-01 — End: 2013-03-01
  Filled 2013-03-01: qty 3

## 2013-03-01 MED ORDER — IPRATROPIUM BROMIDE 0.02 % IN SOLN *I*
RESPIRATORY_TRACT | Status: AC
Start: 2013-03-01 — End: 2013-03-01
  Filled 2013-03-01: qty 2.5

## 2013-03-01 MED ORDER — PREDNISONE 20 MG PO TABS *I*
20.0000 mg | ORAL_TABLET | Freq: Every day | ORAL | Status: AC
Start: 2013-03-01 — End: 2013-03-05

## 2013-03-01 MED ORDER — IPRATROPIUM BROMIDE 0.02 % IN SOLN *I*
500.0000 ug | RESPIRATORY_TRACT | Status: AC
Start: 2013-03-01 — End: 2013-03-01
  Administered 2013-03-01: 500 ug via RESPIRATORY_TRACT
  Filled 2013-03-01: qty 2.5

## 2013-03-01 MED ORDER — PREDNISONE 20 MG PO TABS *I*
60.0000 mg | ORAL_TABLET | Freq: Once | ORAL | Status: AC
Start: 2013-03-01 — End: 2013-03-01
  Administered 2013-03-01: 60 mg via ORAL
  Filled 2013-03-01: qty 3

## 2013-03-01 MED ORDER — BENZONATATE 100 MG PO CAPS *I*
100.0000 mg | ORAL_CAPSULE | Freq: Once | ORAL | Status: AC
Start: 2013-03-01 — End: 2013-03-01
  Administered 2013-03-01: 100 mg via ORAL
  Filled 2013-03-01: qty 1

## 2013-03-01 MED ORDER — BENZONATATE 100 MG PO CAPS *I*
100.0000 mg | ORAL_CAPSULE | Freq: Three times a day (TID) | ORAL | Status: DC | PRN
Start: 2013-03-01 — End: 2013-09-18

## 2013-03-01 MED ORDER — ALBUTEROL SULFATE (2.5 MG/3ML) 0.083% IN NEBU *I*
7.5000 mg | INHALATION_SOLUTION | RESPIRATORY_TRACT | Status: AC
Start: 2013-03-01 — End: 2013-03-01
  Administered 2013-03-01: 7.5 mg via RESPIRATORY_TRACT
  Filled 2013-03-01: qty 9

## 2013-03-01 NOTE — ED Notes (Signed)
Pt presents to ED w/ c/o cough, congestion, and SOB for the past five days. Denies fever. C/o overall body aches. Influenza swab obtained and pt on droplet precautions. Pt given neb treatment as ordered.

## 2013-03-01 NOTE — ED Provider Notes (Signed)
History     Chief Complaint   Patient presents with   . Shortness of Breath     pt has c/o 4-5 days of productive cough, difficulty breathing, fever, chills, and bodyaches with chest discomfort on inspiration.      HPI Comments: Gloria Ray is a 57yo female who presents with shortness of breath and cough. Pt states her symptoms started 5 days ago with cough. She states her cough has worsened and now is productive of yellow-green sputum. She denies fevers or chills at home. She is able to speak in full sentences without dyspnea. She denies orthopnea. She is not O2 dependent at home, nor is she requiring any O2 assistance at the bedside. Pt states "I think I have a URI and need some antibiotics." Pt has used her home nebulizer and inhalers at home with minimal relief. She has no tachypnea or pallor noted.       History provided by:  Patient  Language interpreter used: No    Is this ED visit related to civilian activity for income:  Not work related      Past Medical History   Diagnosis Date   . Asthma    . COPD (chronic obstructive pulmonary disease)    . Coronary artery disease    . Diabetes mellitus    . Hypertension    . Ischemic Stroke 03/06/2006     Created by Conversion    . Prior Myocardial Infarction 03/19/2007     Created by Conversion    . Unspecified cerebral artery occlusion with cerebral infarction      2008   . Arthritis             Past Surgical History   Procedure Laterality Date   . Coronary angioplasty with stent placement     . Bunionectomy         Family History   Problem Relation Age of Onset   . Diabetes Other    . Heart disease Other    . Hypertension Other    . Coronary art dis Other    . Hypertension Mother    . Diabetes Mother    . Alcohol abuse Father    . Other Father      cirrhosis   . Arthritis Sister    . Substance abuse Sister    . Other Brother      HIV   . Substance abuse Brother    . Diabetes Brother          Social History      reports that she quit smoking about 2 years ago.  Her smoking use included Cigarettes. She smoked 0.00 packs per day. She has never used smokeless tobacco. She reports that she does not drink alcohol or use illicit drugs. Her sexual activity history is not on file.    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 Coronary Artery Disease 414.00   . Hypertension 401.9   . Asthma 493.90   . Type II, uncontrolled 250.02   . COPD (chronic obstructive pulmonary  disease) 496   . Dental caries 521.00   . Anxiety and depression 300.4   . Insomnia 780.52   . Pain, dental 525.9   . Bilateral presbyopia 367.4   . 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 Controlled substance agreement signed V58.69   . Chest heaviness 786.59   . 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 UTI (lower urinary tract infection) 599.0   . Dehydration 276.51       Review of Systems   Review of Systems   Constitutional: Positive for fatigue. Negative for fever, chills and appetite change.   HENT: Negative.  Negative for congestion and rhinorrhea.    Eyes: Negative.  Negative for redness.   Respiratory: Positive for cough and shortness of breath. Negative for wheezing and stridor.    Cardiovascular: Negative for chest pain.   Gastrointestinal: Negative.  Negative for nausea and vomiting.   Endocrine: Negative.  Negative for polyuria.   Genitourinary: Negative.  Negative for dysuria.   Musculoskeletal: Negative.  Negative for back pain and gait problem.   Skin: Negative.  Negative for wound.   Allergic/Immunologic: Negative.  Negative for immunocompromised state.   Neurological: Positive for weakness. Negative for seizures.   Hematological: Negative.    Psychiatric/Behavioral: Negative.  Negative for hallucinations.   All other  systems reviewed and are negative.        Physical Exam     ED Triage Vitals   BP Heart Rate Heart Rate(via Pulse Ox) Resp Temp Temp src SpO2 O2 Device O2 Flow Rate   03/01/13 1056 03/01/13 1056 -- 03/01/13 1056 03/01/13 1056 -- 03/01/13 1056 03/01/13 1056 --   143/80 mmHg 92  22 36.3 C (97.3 F)  94 % None (Room air)       Weight           03/01/13 1056           100.699 kg (222 lb)               Physical Exam   Nursing note and vitals reviewed.  Constitutional: She is oriented to person, place, and time. She appears well-developed and well-nourished. No distress.   HENT:   Head: Normocephalic and atraumatic.   Eyes: Conjunctivae are normal.   Neck: Normal range of motion.   Cardiovascular: Normal rate, regular rhythm, S1 normal, S2 normal, normal heart sounds and intact distal pulses.    Pulmonary/Chest: Effort normal. No stridor. She has no wheezes. She has rhonchi (diffuse). She has no rales. She exhibits tenderness (s/p coughing).   Abdominal: Soft. Normal appearance and bowel sounds are normal. She exhibits no distension.   Musculoskeletal: Normal range of motion. She exhibits no edema.   Neurological: She is alert and oriented to person, place, and time.   Skin: Skin is warm and dry. She is not diaphoretic.   Psychiatric: She has a normal mood and affect. Her behavior is normal. Judgment and thought content normal.       Medical Decision Making   <EDMDM>    Initial Evaluation:  ED First Provider Contact    Date/Time Event User Comments    03/01/13 1101 ED Provider First Contact DIDAS, Izaiha Lo Initial Face to Face Provider Contact          Patient seen by me today 03/01/2013 at 1110    Assessment:  57 y.o., female comes to the ED with shortness of breath, cough    Differential Diagnosis includes   1.)  URTI  2.) Bronchitis  3.) PNA             Plan:   1.) CXR  2.) Flu swab  3.) EKG  4.) Nebs    No acute infection on CXR. Pt responded well to Nebs. Will give dose of Steroids here and cough suppressant and then  d/c home.       Stoney Bang, NP    Supervising physician Dr. Barnetta Chapel was immediately available      Didas, Emeline Darling, NP  03/01/13 1259

## 2013-03-04 ENCOUNTER — Other Ambulatory Visit: Payer: Self-pay | Admitting: Family Medicine

## 2013-03-04 ENCOUNTER — Other Ambulatory Visit: Payer: Self-pay | Admitting: Geriatric Medicine

## 2013-03-04 ENCOUNTER — Other Ambulatory Visit: Payer: Self-pay

## 2013-03-04 DIAGNOSIS — E860 Dehydration: Secondary | ICD-10-CM

## 2013-03-04 LAB — EKG 12-LEAD
P: 62 degrees
QRS: -1 degrees
Rate: 95 {beats}/min
Severity: ABNORMAL
Severity: ABNORMAL
T: 86 degrees

## 2013-03-05 ENCOUNTER — Encounter: Payer: Self-pay | Admitting: Gastroenterology

## 2013-03-06 ENCOUNTER — Ambulatory Visit
Admit: 2013-03-06 | Discharge: 2013-04-05 | Disposition: A | Discharge status: Home / Self Care | Payer: Self-pay | Source: Ambulatory Visit | Attending: Psychiatry | Admitting: Psychiatry

## 2013-03-09 ENCOUNTER — Encounter: Payer: Self-pay | Admitting: Gastroenterology

## 2013-03-09 MED ORDER — AMLODIPINE BESYLATE 10 MG PO TABS *I*
10.0000 mg | ORAL_TABLET | Freq: Every day | ORAL | Status: DC
Start: 2013-03-04 — End: 2013-04-16

## 2013-03-11 ENCOUNTER — Encounter: Payer: Self-pay | Admitting: Gastroenterology

## 2013-03-11 ENCOUNTER — Other Ambulatory Visit: Payer: Self-pay | Admitting: Family Medicine

## 2013-03-19 ENCOUNTER — Ambulatory Visit: Payer: Self-pay | Admitting: Family Medicine

## 2013-03-19 ENCOUNTER — Encounter: Payer: Self-pay | Admitting: Family Medicine

## 2013-03-19 VITALS — BP 137/67 | HR 72 | Temp 97.7°F | Ht 64.17 in | Wt 227.0 lb

## 2013-03-19 DIAGNOSIS — M179 Osteoarthritis of knee, unspecified: Secondary | ICD-10-CM

## 2013-03-19 DIAGNOSIS — J189 Pneumonia, unspecified organism: Secondary | ICD-10-CM

## 2013-03-19 DIAGNOSIS — J449 Chronic obstructive pulmonary disease, unspecified: Secondary | ICD-10-CM

## 2013-03-19 DIAGNOSIS — M1710 Unilateral primary osteoarthritis, unspecified knee: Secondary | ICD-10-CM

## 2013-03-19 DIAGNOSIS — IMO0001 Reserved for inherently not codable concepts without codable children: Secondary | ICD-10-CM

## 2013-03-19 MED ORDER — MORPHINE SULFATE ER 30 MG PO TBCR *I*
30.0000 mg | ORAL_TABLET | Freq: Two times a day (BID) | ORAL | Status: DC | PRN
Start: 2013-03-19 — End: 2013-04-16

## 2013-03-19 MED ORDER — DOXYCYCLINE HYCLATE 100 MG PO TABS *I*
100.0000 mg | ORAL_TABLET | Freq: Two times a day (BID) | ORAL | Status: AC
Start: 2013-03-19 — End: 2013-03-29

## 2013-03-19 MED ORDER — MORPHINE SULFATE ER 15 MG PO TBCR *I*
15.0000 mg | ORAL_TABLET | Freq: Three times a day (TID) | ORAL | Status: DC | PRN
Start: 2013-03-19 — End: 2013-03-19

## 2013-03-19 MED ORDER — PREDNISONE 20 MG PO TABS *I*
40.0000 mg | ORAL_TABLET | Freq: Every day | ORAL | Status: DC | PRN
Start: 2013-03-19 — End: 2013-08-01

## 2013-03-19 MED ORDER — AZITHROMYCIN 250 MG PO TABS *I*
ORAL_TABLET | ORAL | Status: DC
Start: 2013-03-19 — End: 2013-03-19

## 2013-03-19 NOTE — Progress Notes (Signed)
Assessment & Plan        ANGELEAH LABRAKE is a 58 y.o. female and  has a past medical history of Asthma; COPD (chronic obstructive pulmonary disease); Coronary artery disease; Diabetes mellitus; Hypertension; Ischemic Stroke (03/06/2006); Prior Myocardial Infarction (03/19/2007); Unspecified cerebral artery occlusion with cerebral infarction; and Arthritis.       The patient evaluated today for: URI      Chronic Obstructive Pulmonary Disease  - predniSONE (DELTASONE) 20 MG tablet; Take 2 tablets (40 mg total) by mouth daily as needed   for 5 days course during COPD exacerbations    Pneumonia  - doxycycline (VIBRA-TABS) 100 MG tablet; Take 1 tablet (100 mg total) by mouth 2 times daily for 10 days    DJD (degenerative joint disease) of knee, bilateral  - morphine (MS CONTIN) 30 MG 12 hr tablet; Take 1 tablet (30 mg total) by mouth 2 times daily as needed for Pain   Max daily dose: 60 mg    Type II, uncontrolled  - Counseled regarding close monitoring and possible need for more insulin while ill and due to prednisone         Subjective                      Chief Complaint: URI    HPI  Chronic Obstructive Pulmonary Disease  Course:  recurrent  Onset:  Initial symptoms several weeks ago, progressively worsening.  ED visit 12/27, reported 5 days of sx at that time, received steroids but no abx  Description:  Chest congestion, productive cough (green), increased SOB  Inciting events/factors:  none  Exacerbated by:  none  Relieved by:  Tried guaifenesin without relief, taking inhalers but decreased efficacy  Associated symptoms:  Denies fever, chills, diarrhea.  She does report an aspiration event that caused her to pass out and fall several days ago, scraping her R face/eye.  She did not seek medical attention.     DJD (degenerative joint disease) of knee, bilateral       Requesting repeat joint injections.  Not currently seeking knee replacement, worried about surgery.  Denies constipation.  Using long acting morphine 2-3  times daily.    Type II, uncontrolled       Notes BPs are well controlled.  Worried about weight gain.  Not sure how well controlled diabetes is.  Discussed activity and barriers to activity including DJD.    History       Reviewed and updated where appropriate based on HPI and patient report.      ROS       Pertinent positives and negatives of a 12-point review of systems noted above.       Objective   Recorded Vital Signs:             BP 137/67   Pulse 72   Temp(Src) 36.5 C (97.7 F) (Temporal)   Ht 1.63 m (5' 4.17")   Wt 102.967 kg (227 lb)   BMI 38.75 kg/m2   SpO2 98%               Wt Readings from Last 7 Encounters:   03/19/13 102.967 kg (227 lb)   03/01/13 100.699 kg (222 lb)   01/24/13 105.688 kg (233 lb)   12/27/12 106.595 kg (235 lb)   12/13/12 0.045 kg (1.6 oz)   12/08/12 107.8 kg (237 lb 10.5 oz)   11/13/12 109.77 kg (242 lb)  Physical Exam   VITALS: Reviewed above and WNL.  BMI category is moderate obesity  GEN: Well developed, well nourished, appears stated age, no acute distress.  HEENT: Atraumatic.  EOM normal, conjunctiva clear, no blepharitis.  RESP: Baseline mild breathlessness.  Wheezes RUL with mildly decreased air entry bilaterally compared to baseline   EXT: Warm and well perfused.  No edema.   SKIN: Warm and dry. No rashes, ecchymoses, or petechiae of exposed surfaces.  NEURO:  Alert.  Cranial nerves grossly intact.  Strength is normal and symmetric.  Gait is normal.  Balance is normal.    PSYCH:  Patient well dressed and well groomed, sitting in chair.  Speech is normal.  Mood is euthymic.  Affect is congruent with mood.  Thought content is linear with no expressed paranoia, delusions, or hallucinations.  Judgement is normal.  Insight is normal.      Imaging  Reviewed:  Portable CXR from 12/27 ED visit: Mild pulmonary edema without focal disease.  No formal read is available.         Note in APSO format, see top for Assessment & Plan.     Everlean PattersonMatthew Dermot Gremillion MD, PhD   [facility]   03/19/2013  11:54 AM     __________________________________________________________________________________    03/19/2013 12:09 PM    Pre[facility] Attestation - [facility]   Preceptor: Esmeralda Arthurhristopher Taggart, MD    I reviewed, discussed, and independently saw the patient at the time of the visit. I confirmed significant history, exam findings, and plan of care with Everlean PattersonMatthew Monique Hefty, MD.  I agree with the findings and plan of care as documented above with additions documented below.    Exam:  GENERAL:  Pleasant obese female, NAD, A&Ox3, increased work of breathing with inability to complete sentence without stopping       Esmeralda Arthurhristopher Taggart, MD  Attending, [facility]

## 2013-03-22 ENCOUNTER — Encounter: Payer: Self-pay | Admitting: Family Medicine

## 2013-03-23 ENCOUNTER — Encounter: Payer: Self-pay | Admitting: Gastroenterology

## 2013-03-28 ENCOUNTER — Ambulatory Visit: Payer: Self-pay | Admitting: Family Medicine

## 2013-03-29 ENCOUNTER — Telehealth: Payer: Self-pay | Admitting: Family Medicine

## 2013-03-29 NOTE — Telephone Encounter (Signed)
[facility]: After Hours / On-Call Note    Date of Call: 03/29/2013    Time of Call: 1:45pm    CCP:  Juliane PootHeckman    [facility]. Reports that she has lost 9 lbs in 4 days. Reports that her weight typically goes up and down by 4-6 lbs every 4 days. Believes increased weight loss is d/t recent change in diet - stopped eating salty snacks at night and cut back on soda. Pt feels well without complaints. Vitals WNL: BP 147/89, HR 82, saturating 92%. Nurse to see pt next on 1/27.    Impression: Weight loss    Disposition/Plan:  HCR to send nurse to see pt tomorrow if has weight loss >/= 2 lbs in next day. Also will send weekly notification to CCP.    Fritz PickerelElizabeth A Joram Venson, MD on 03/29/2013 at 2:53 PM

## 2013-03-31 ENCOUNTER — Telehealth: Payer: Self-pay

## 2013-03-31 NOTE — Telephone Encounter (Signed)
FYI:  TC from [facility], [place], who wishes to let PCP know about pt's recent weight swings. Pt had lost 9.1 lb over the last 4 days which was concerning but most likely r/t diarrhea which has resolved. Pt today, though, has gained 4.5 lb. CHN visited today.  Pt admits to attending a party the night before. No sx: no SOB, vitals stabel: 104/74, HR80, sat: 95%.   Plan: pt will monitor for edema, will monitor Na+ intake.   Pt will receive CHN visit tomorrow.

## 2013-04-01 NOTE — Telephone Encounter (Signed)
Copy of encounter faxed to facility

## 2013-04-01 NOTE — Telephone Encounter (Signed)
Yeah, enter the encounter, get into the documentation field and put that information, returning it to the RN. When the encounter gets sent it closes with the MD's "order" and "signature" allowing the encounter itself to be faxed as an order.

## 2013-04-01 NOTE — Telephone Encounter (Signed)
Any idea how can I let telehealth know that I do not need to know about Cylee's weight change unless >10lbs?   Everlean PattersonMatthew Heckman MD, PhD

## 2013-04-01 NOTE — Telephone Encounter (Signed)
Please change reportable weight change parameters to >10lbs.  Patient does not have CHF and does not need closer monitoring of weight or LE edema.     Please feel free to call with questions or concerns.    Everlean PattersonMatthew Toretto Tingler MD, PhD  Family Medicine, R3  04/01/2013 3:23 PM

## 2013-04-06 ENCOUNTER — Ambulatory Visit
Admit: 2013-04-06 | Discharge: 2013-05-03 | Disposition: A | Discharge status: Home / Self Care | Payer: Self-pay | Source: Ambulatory Visit | Attending: Psychiatry | Admitting: Psychiatry

## 2013-04-07 ENCOUNTER — Other Ambulatory Visit: Payer: Self-pay | Admitting: Family Medicine

## 2013-04-07 DIAGNOSIS — F32A Depression, unspecified: Secondary | ICD-10-CM

## 2013-04-07 DIAGNOSIS — M1710 Unilateral primary osteoarthritis, unspecified knee: Secondary | ICD-10-CM

## 2013-04-07 DIAGNOSIS — M179 Osteoarthritis of knee, unspecified: Secondary | ICD-10-CM

## 2013-04-07 MED ORDER — BUPROPION HCL 150 MG PO TB24 *I*
150.0000 mg | ORAL_TABLET | Freq: Every morning | ORAL | Status: DC
Start: 2013-04-07 — End: 2013-04-21

## 2013-04-07 MED ORDER — ACETAMINOPHEN 500 MG PO TABS *I*
1000.0000 mg | ORAL_TABLET | Freq: Three times a day (TID) | ORAL | Status: AC | PRN
Start: 2013-04-07 — End: ?

## 2013-04-09 ENCOUNTER — Telehealth: Payer: Self-pay

## 2013-04-09 NOTE — Telephone Encounter (Signed)
1) Pt being recertified for 60 days.    2) Strong odor of marijuana noticed in home at visit today. Pt insists she does not use drugs, but that her boyfriend and son do. Pt was advised that if drug use continued in the home home care would be terminated. HCR wanted Dr aware.

## 2013-04-15 ENCOUNTER — Encounter: Payer: Self-pay | Admitting: Gastroenterology

## 2013-04-15 ENCOUNTER — Other Ambulatory Visit: Payer: Self-pay | Admitting: Geriatric Medicine

## 2013-04-15 NOTE — Telephone Encounter (Signed)
Reaction expected; Received call from [facility] Briar-Susan,RN( [phone] ), she reports about a week ago, pt stopped taking her Wellbutrin ( "pt.removed it from her medi-set), says it was making her dizzy.  Is there any other med you'd like to try? Thanks.

## 2013-04-16 ENCOUNTER — Other Ambulatory Visit: Payer: Self-pay | Admitting: Family Medicine

## 2013-04-16 ENCOUNTER — Other Ambulatory Visit: Payer: Self-pay

## 2013-04-16 ENCOUNTER — Encounter: Payer: Self-pay | Admitting: Family Medicine

## 2013-04-16 ENCOUNTER — Ambulatory Visit: Payer: Self-pay | Admitting: Family Medicine

## 2013-04-16 VITALS — BP 138/82 | HR 76 | Temp 97.5°F | Ht 64.17 in | Wt 222.0 lb

## 2013-04-16 DIAGNOSIS — B3731 Acute candidiasis of vulva and vagina: Secondary | ICD-10-CM

## 2013-04-16 DIAGNOSIS — G4700 Insomnia, unspecified: Secondary | ICD-10-CM

## 2013-04-16 DIAGNOSIS — M179 Osteoarthritis of knee, unspecified: Secondary | ICD-10-CM

## 2013-04-16 DIAGNOSIS — F419 Anxiety disorder, unspecified: Secondary | ICD-10-CM

## 2013-04-16 DIAGNOSIS — IMO0001 Reserved for inherently not codable concepts without codable children: Secondary | ICD-10-CM

## 2013-04-16 DIAGNOSIS — E860 Dehydration: Secondary | ICD-10-CM

## 2013-04-16 DIAGNOSIS — F32A Depression, unspecified: Secondary | ICD-10-CM

## 2013-04-16 DIAGNOSIS — M1710 Unilateral primary osteoarthritis, unspecified knee: Secondary | ICD-10-CM

## 2013-04-16 DIAGNOSIS — B373 Candidiasis of vulva and vagina: Secondary | ICD-10-CM

## 2013-04-16 LAB — CBC
Hematocrit: 36 % (ref 34–45)
Hemoglobin: 11.3 g/dL (ref 11.2–15.7)
MCH: 26 pg/cell (ref 26–32)
MCHC: 32 g/dL (ref 32–36)
MCV: 81 fL (ref 79–95)
Platelets: 202 10*3/uL (ref 160–370)
RBC: 4.4 MIL/uL (ref 3.9–5.2)
RDW: 16.7 % — ABNORMAL HIGH (ref 11.7–14.4)
WBC: 5.8 10*3/uL (ref 4.0–10.0)

## 2013-04-16 LAB — LDL CHOLESTEROL, DIRECT: LDL Direct: 104 mg/dL

## 2013-04-16 LAB — COMPREHENSIVE METABOLIC PANEL
ALT: 18 U/L (ref 0–35)
AST: 11 U/L (ref 0–35)
Albumin: 4 g/dL (ref 3.5–5.2)
Alk Phos: 92 U/L (ref 35–105)
Anion Gap: 16 (ref 7–16)
Bilirubin,Total: <0.2 mg/dL (ref 0.0–1.2)
CO2: 25 mmol/L (ref 20–28)
Calcium: 9.2 mg/dL (ref 8.6–10.2)
Chloride: 93 mmol/L — ABNORMAL LOW (ref 96–108)
Creatinine: 1.12 mg/dL — ABNORMAL HIGH (ref 0.51–0.95)
GFR,Black: 63 *
GFR,Caucasian: 55 * — AB
Glucose: 485 mg/dL — ABNORMAL HIGH (ref 60–99)
Lab: 17 mg/dL (ref 6–20)
Potassium: 4.5 mmol/L (ref 3.3–5.1)
Sodium: 134 mmol/L (ref 133–145)
Total Protein: 7 g/dL (ref 6.3–7.7)

## 2013-04-16 LAB — LIPID PANEL
Chol/HDL Ratio: 5.7
Cholesterol: 256 mg/dL — AB
HDL: 45 mg/dL
Non HDL Cholesterol: 211 mg/dL
Triglycerides: 944 mg/dL — AB

## 2013-04-16 MED ORDER — FLUCONAZOLE 150 MG PO TABS *I*
150.0000 mg | ORAL_TABLET | Freq: Once | ORAL | Status: AC
Start: 2013-04-16 — End: 2013-04-16

## 2013-04-16 MED ORDER — TRAZODONE HCL 100 MG PO TABS *I*
200.0000 mg | ORAL_TABLET | Freq: Every evening | ORAL | Status: DC | PRN
Start: 2013-04-16 — End: 2013-09-29

## 2013-04-16 MED ORDER — BUSPIRONE HCL 30 MG PO TABS *I*
30.0000 mg | ORAL_TABLET | Freq: Two times a day (BID) | ORAL | Status: DC
Start: 2013-04-16 — End: 2013-05-27

## 2013-04-16 MED ORDER — HYDROXYZINE HCL 50 MG PO TABS *I*
50.0000 mg | ORAL_TABLET | Freq: Three times a day (TID) | ORAL | Status: DC | PRN
Start: 2013-04-16 — End: 2013-08-21

## 2013-04-16 MED ORDER — AMLODIPINE BESYLATE 10 MG PO TABS *I*
10.0000 mg | ORAL_TABLET | Freq: Every day | ORAL | Status: DC
Start: 2013-04-16 — End: 2013-06-11

## 2013-04-16 MED ORDER — MORPHINE SULFATE ER 30 MG PO TBCR *I*
30.0000 mg | ORAL_TABLET | Freq: Two times a day (BID) | ORAL | Status: DC | PRN
Start: 2013-04-16 — End: 2013-04-16

## 2013-04-16 MED ORDER — MORPHINE SULFATE ER 30 MG PO TBCR *I*
30.0000 mg | ORAL_TABLET | Freq: Two times a day (BID) | ORAL | Status: DC | PRN
Start: 2013-05-14 — End: 2013-06-11

## 2013-04-16 NOTE — Progress Notes (Signed)
Assessment & Plan        Gloria Ray is a 58 y.o. female and  has a past medical history of Asthma; COPD (chronic obstructive pulmonary disease); Coronary artery disease; Diabetes mellitus; Hypertension; Ischemic Stroke (03/06/2006); Prior Myocardial Infarction (03/19/2007); Unspecified cerebral artery occlusion with cerebral infarction; and Arthritis.       The patient evaluated today for: Diabetes      Anxiety and depression  - hydrOXYzine HCl (ATARAX) 50 MG tablet; Take 1 tablet (50 mg total) by mouth 3 times daily as needed for Itching or Anxiety  -     Continue sertraline, d/c bupropion as patient did not tolerate    Insomnia  - hydrOXYzine HCl (ATARAX) 50 MG tablet; Take 1 tablet (50 mg total) by mouth 3 times daily as needed for Itching or Anxiety  - traZODone (DESYREL) 100 MG tablet; Take 2 tablets (200 mg total) by mouth nightly as needed for Sleep    DJD (degenerative joint disease) of knee, bilateral  - morphine (MS CONTIN) 30 MG 12 hr tablet; Take 1 tablet (30 mg total) by mouth 2 times daily as needed for Pain   Max daily dose: 60 mg  -     Continue to follow with Ortho    Yeast vaginitis  - fluconazole (DIFLUCAN) 150 MG tablet; Take 1 tablet (150 mg total) by mouth once for 1 dose    Diabetes Type II, uncontrolled.  Non-adherent with diabetic diet, drinking [facility] in exam room.  - CBC  - Comprehensive metabolic panel  - Lipid panel  - Hemoglobin A1c  -     F/u for labs, DM management  -     Educated regarding diet, currently pre-contemplative         Subjective                      Chief Complaint: Diabetes    HPI  Anxiety and depression       Did not tolerate bupropion, made here dizzy.  Is open to therapy and further trials of medication.   No current SI/HI.    Insomnia       Not sleeping well, wants more trazodone.  Discussed danger of too much trazodone, reminded that she had palpitations when she took too much against medical advice.  Willing to trial hydroxyzine.    DJD (degenerative  joint disease) of knee, bilateral       Better after knee injections.  Wants to do again.  Did receive power wheelchair.  BGs have been high, states needs to do better with diabetes control.    Yeast vaginitis         Has typical symptoms of itching and discharge, single dose diflucan effective in the past.    History       Reviewed and updated where appropriate based on HPI and patient report.      ROS       Pertinent positives and negatives of a 12-point review of systems noted above.       Objective   Recorded Vital Signs:             BP 138/82    Pulse 76    Temp(Src) 36.4 C (97.5 F) (Temporal)    Ht 1.63 m (5' 4.17")    Wt 100.699 kg (222 lb)    BMI 37.90 kg/m2  Wt Readings from Last 7 Encounters:   04/16/13 100.699 kg (222 lb)   03/19/13 102.967 kg (227 lb)   03/01/13 100.699 kg (222 lb)   01/24/13 105.688 kg (233 lb)   12/27/12 106.595 kg (235 lb)   12/13/12 0.045 kg (1.6 oz)   12/08/12 107.8 kg (237 lb 10.5 oz)       Physical Exam   VITALS: Reviewed above and WNL.  BMI category is moderate obesity.  GEN: Well developed, well nourished, appears stated age, no acute distress.  HEENT: Atraumatic.  EOM normal, conjunctiva clear, no blepharitis.  RESP: Unlabored breaths.   EXT: Warm and well perfused.  No edema.   SKIN: Warm and dry. No rashes, ecchymoses, or petechiae of exposed surfaces.  NEURO:  Alert.  Cranial nerves grossly intact.  Strength is normal and symmetric.  Gait is normal.  Balance is normal.    PSYCH:  Patient well dressed and well groomed, sitting in chair.  Speech is normal.  Mood is euthymic.  Affect is congruent with mood.  Thought content is linear with no expressed paranoia, delusions, or hallucinations.  Judgement is normal.  Insight is normal.         Note in APSO format, see top for Assessment & Plan.     Everlean PattersonMatthew Misbah Hornaday MD, PhD   [facility]   04/16/2013  3:13 PM       --------------------    Preceptor Attestation - [facility]   I saw and  evaluated the patient. I agree with the resident's/fellow's findings and plan of care as documented above. Details of my evaluation are as follows: T2DM- due for labs today  Knee OA- still doing well after her steroid injections; uses morphine prn; home PT seems to be going well  Mental Health- referred to [facility]; continue current SSRI and buspar and hydroxyzine  Insomnia- trazodone prn and hydroxyzine  Harriet ButteLindsay Phillips, MD

## 2013-04-17 ENCOUNTER — Telehealth: Payer: Self-pay

## 2013-04-17 NOTE — Telephone Encounter (Signed)
Pt seen and treated by Dr. Juliane PootHeckman today for this issue.    Gloria Ray Gloria Marsch, MD  Family Medicine R2  04/17/2013 4:26 PM

## 2013-04-17 NOTE — Progress Notes (Signed)
Quick Note:    A: Abnormal results noted, non-urgent.  P:   - May discuss normal and abnormal values at next visit   - May call/MyChart message MD with questions/concerns  ______

## 2013-04-17 NOTE — Telephone Encounter (Signed)
FYI:   Pt VSS: BP: 133/80 HR: 87, but O2 was 87%. Called pt who stated she was sleeping and would check it again later, she denies symptoms. Pt also had 7 lb weight loss, which is not unusual , but advising since calling for other things. HCR will call again tomorrow after pt rechecks O2 level.

## 2013-04-18 LAB — HEMOGLOBIN A1C: Hemoglobin A1C: 14.1 % — ABNORMAL HIGH (ref 4.0–6.0)

## 2013-04-21 ENCOUNTER — Other Ambulatory Visit: Payer: Self-pay | Admitting: Family Medicine

## 2013-04-21 MED ORDER — SERTRALINE HCL 100 MG PO TABS *I*
100.0000 mg | ORAL_TABLET | Freq: Every day | ORAL | Status: DC
Start: 2013-04-16 — End: 2013-09-18

## 2013-04-21 NOTE — Progress Notes (Signed)
Quick Note:    A: Abnormal results noted, non-urgent.  P:   - May discuss normal and abnormal values at next visit   - May call/MyChart message MD with questions/concerns  ______

## 2013-04-29 ENCOUNTER — Telehealth: Payer: Self-pay

## 2013-04-29 NOTE — Telephone Encounter (Signed)
Noticing a definite change in pt demeanor that has HCR very concerned. They are quite sure that pt boyfriend, son and even maintenance workers from [employer] are smoking weed in pt's apt, though this has not been visualized by RN. If it IS they will immediately D/C pt from services. Pt supposedly dropped her mediset with all her meds several days prior to visit and just put them back in no kind of order and states she has been taking them, but can't explain how she knows what medication to take when. HCR is diligently working to get pt set up with packaged meds, but has not quite happened yet. [facility] is [facility] drug [facility]

## 2013-05-04 ENCOUNTER — Ambulatory Visit
Admit: 2013-05-04 | Discharge: 2013-06-03 | Disposition: A | Discharge status: Home / Self Care | Payer: Self-pay | Source: Ambulatory Visit | Attending: Psychiatry | Admitting: Psychiatry

## 2013-05-05 ENCOUNTER — Encounter: Payer: Self-pay | Admitting: Gastroenterology

## 2013-05-05 ENCOUNTER — Telehealth: Payer: Self-pay

## 2013-05-05 NOTE — Telephone Encounter (Signed)
FYI   2/28 BP: 147/99 HR: 84 Asymptomatic. Pt took BP just AFTER taking BP meds. Pt advised to take BP again later in day, but stated she got busy and did not do so.  3/1 BP: 147/92 HR: 80 Asymptomatic Pt advised to take BP again later in afternoon, pt did NOT  NO BP avail at this time for 3/2

## 2013-05-12 ENCOUNTER — Encounter: Payer: Self-pay | Admitting: Gastroenterology

## 2013-05-13 ENCOUNTER — Telehealth: Payer: Self-pay

## 2013-05-13 ENCOUNTER — Other Ambulatory Visit: Payer: Self-pay | Admitting: Family Medicine

## 2013-05-13 NOTE — Telephone Encounter (Signed)
Pt has 3/11 appt and HCR wants Dr aware that pt seems lethargic and despondent lately. People who know her are also concerned saying she doesn't come out and talk anymore. In doing her medi sets it is obvious that pt is NOT taking half her meds and her BP's are reflective of that. Pt just "is NOT herself". Not sure if it is related to the marijuana use in the apt or not, but HCR wanted to pass info on to Dr prior to pt appt. Only appt showing for pt in system is 4/8.

## 2013-05-15 ENCOUNTER — Encounter: Payer: Self-pay | Admitting: Gastroenterology

## 2013-05-19 ENCOUNTER — Other Ambulatory Visit: Payer: Self-pay | Admitting: Family Medicine

## 2013-05-27 ENCOUNTER — Other Ambulatory Visit: Payer: Self-pay | Admitting: Family Medicine

## 2013-05-27 ENCOUNTER — Encounter: Payer: Self-pay | Admitting: Gastroenterology

## 2013-05-28 ENCOUNTER — Other Ambulatory Visit: Payer: Self-pay

## 2013-06-02 ENCOUNTER — Telehealth: Payer: Self-pay | Admitting: Geriatric Medicine

## 2013-06-02 NOTE — Telephone Encounter (Signed)
Hello,    Patient gained 7.3 lbs since yesterday over the last 7 day and lost 1.4 today. Vitals are perfectly stable.    Thanks,

## 2013-06-02 NOTE — Telephone Encounter (Signed)
FYI

## 2013-06-04 ENCOUNTER — Ambulatory Visit
Admit: 2013-06-04 | Discharge: 2013-07-03 | Disposition: A | Discharge status: Home / Self Care | Payer: Self-pay | Source: Ambulatory Visit | Attending: Psychiatry | Admitting: Psychiatry

## 2013-06-09 ENCOUNTER — Telehealth: Payer: Self-pay

## 2013-06-09 ENCOUNTER — Other Ambulatory Visit: Payer: Self-pay | Admitting: Family Medicine

## 2013-06-09 NOTE — Telephone Encounter (Signed)
nino from hcr would like to speak to a nurse in regards to vitals on this patient-please follow up

## 2013-06-09 NOTE — Telephone Encounter (Signed)
Wanted to conform if doctor is aware that the patient has stopped taking Metformin. Also the patient has been recertified for Home Care.

## 2013-06-09 NOTE — Telephone Encounter (Signed)
4/3 wt: 205.9  4/4 wt: 203.9  B/P 140/97 HR 98 in AM  121/79 HR 89 in PM  4/5/ wt: 201.4 BP: 143/94 HR 105 in AM. Pt to recheck in PM, but did NOT  4/6 wt: 208.3 BP 92/69 HR 83    FYI: pt weight was down, now up again and still having higher BP's in AM, likely prior to meds.

## 2013-06-10 ENCOUNTER — Ambulatory Visit: Payer: Self-pay | Admitting: Family Medicine

## 2013-06-11 ENCOUNTER — Encounter: Payer: Self-pay | Admitting: Family Medicine

## 2013-06-11 ENCOUNTER — Ambulatory Visit: Payer: Self-pay | Admitting: Family Medicine

## 2013-06-11 VITALS — BP 135/83 | HR 80 | Temp 97.9°F | Ht 64.17 in | Wt 213.0 lb

## 2013-06-11 DIAGNOSIS — M179 Osteoarthritis of knee, unspecified: Secondary | ICD-10-CM

## 2013-06-11 DIAGNOSIS — Z79899 Other long term (current) drug therapy: Secondary | ICD-10-CM

## 2013-06-11 DIAGNOSIS — IMO0001 Reserved for inherently not codable concepts without codable children: Secondary | ICD-10-CM

## 2013-06-11 DIAGNOSIS — N939 Abnormal uterine and vaginal bleeding, unspecified: Secondary | ICD-10-CM | POA: Insufficient documentation

## 2013-06-11 DIAGNOSIS — M1710 Unilateral primary osteoarthritis, unspecified knee: Secondary | ICD-10-CM

## 2013-06-11 MED ORDER — FREESTYLE LITE TEST VI STRP *A*
ORAL_STRIP | Status: AC
Start: 2013-06-11 — End: ?

## 2013-06-11 MED ORDER — MORPHINE SULFATE ER 30 MG PO TBCR *I*
30.0000 mg | ORAL_TABLET | Freq: Two times a day (BID) | ORAL | Status: DC | PRN
Start: 2013-06-11 — End: 2013-07-04

## 2013-06-11 NOTE — Progress Notes (Signed)
Assessment & Plan        Gloria Ray is a 58 y.o. female and  has a past medical history of Asthma; COPD (chronic obstructive pulmonary disease); Coronary artery disease; Diabetes mellitus; Hypertension; Ischemic Stroke (03/06/2006); Prior Myocardial Infarction (03/19/2007); Unspecified cerebral artery occlusion with cerebral infarction; and Arthritis.       The patient evaluated today for: Knee Pain and Vaginal Bleeding      Vaginal bleeding.  Needs AGY, no recent PAP or pelvic.  Post-menopausal bleeding, consider cervical or endometrial cancer in differential.  -   F/u with NP for AGY and pelvic exam, PAP  -   Consider further imaging or endometrial biopsy if no obvious abnormality on pelvic    DJD (degenerative joint disease) of knee, bilateral  - morphine (MS CONTIN) 30 MG 12 hr tablet; Take 1 tablet (30 mg total) by mouth 2 times daily as needed for Pain   Max daily dose: 60 mg 2 week supply  - Pain clinic profile; Future    Type II, uncontrolled  - FREESTYLE LITE test strip; Test 4 times daily as instructed.  ICD9 250.02; Diabetes mellitus, uncontrolled.         Subjective                      Chief Complaint: Knee Pain and Vaginal Bleeding    HPI  Vaginal bleeding  Course:  New  Onset:   ~ week ago Sunday or Monday  Description:  Bright red blood, ~2 inch spot in underwear  Inciting events/factors:  None identified  Exacerbated by:  nothing  Relieved by:  Nothing, has not come back  Associated symptoms:  RLQ abdominal pain, reports house scale 205lbs, has been trying to lose weight, down from 240    DJD (degenerative joint disease) of knee, bilateral       Difficult to function on days without morphine.  Denies illicit substance use. Requesting refill.  Discussed weaning as patient not pursuing definitive measures such as injections or surgery.  Previously doing PT.  Would like to walk more with better weather.  Does use power chair for longer distances.  Agreed to continue conversation at next  visit.    Type II, uncontrolled       Requesting test strips, reviewed history of diabetes control and current poor control.  Patient links this too mood, medication and diet adherence.      History       Reviewed and updated where appropriate based on HPI and patient report.      ROS       Pertinent positives and negatives of a 12-point review of systems noted above.       Objective   Recorded Vital Signs:             BP 135/83    Pulse 80    Temp(Src) 36.6 C (97.9 F) (Temporal)    Ht 1.63 m (5' 4.17")    Wt 96.616 kg (213 lb)    BMI 36.36 kg/m2                  Wt Readings from Last 7 Encounters:   06/11/13 96.616 kg (213 lb)   04/16/13 100.699 kg (222 lb)   03/19/13 102.967 kg (227 lb)   03/01/13 100.699 kg (222 lb)   01/24/13 105.688 kg (233 lb)   12/27/12 106.595 kg (235 lb)   12/13/12 0.045 kg (1.6 oz)  Physical Exam   VITALS: Reviewed above and WNL.  BMI category is moderate obesity  GEN: Well developed, well nourished, appears stated age, no acute distress  HEENT: Atraumatic.  EOM normal, conjunctiva clear, no blepharitis.   RESP: Baseline mildly labored breaths.  Lungs are clear to auscultation bilaterally with fair effort.  No crackles, rales, or wheezes appreciated.  CV: Regular rhythm, S1, S2.  No S3, S4, murmurs or rubs.   ABD: Bowel sounds normal.  Soft, non-tender, non-distended.   EXT: Warm and well perfused.  No edema.   SKIN: Warm and dry. No rashes, ecchymoses, or petechiae of exposed surfaces  NEURO:  Alert.  Cranial nerves grossly intact.  Strength is normal and symmetric.  Gait is antalgic.  Balance is normal.    PSYCH:  Patient well dressed and well groomed, sitting in chair.  Speech is normal.  Mood is "pretty good".  Affect is congruent with mood.  Thought content is linear with no expressed paranoia, delusions, or hallucinations.  Judgement is normal.  Insight is normal.        Note in APSO format, see top for Assessment & Plan.     Everlean PattersonMatthew Aleshka Corney MD, PhD   [facility]   06/11/2013  1:35 PM       --------------------    Preceptor Attestation - Patient Seen    I saw and evaluated the patient. I agree with the resident's/fellow's findings and plan of care as documented above.         Hyman BowerElizabeth Loomis, MD

## 2013-06-13 ENCOUNTER — Encounter: Payer: Self-pay | Admitting: Gastroenterology

## 2013-06-16 ENCOUNTER — Telehealth: Payer: Self-pay

## 2013-06-16 NOTE — Telephone Encounter (Signed)
Pt has not reported telehealth messaging in 3 days. Just FYI

## 2013-06-18 ENCOUNTER — Encounter: Payer: Self-pay | Admitting: Family Medicine

## 2013-06-18 ENCOUNTER — Ambulatory Visit: Payer: Self-pay | Admitting: Family Medicine

## 2013-06-18 VITALS — BP 139/75 | HR 78 | Temp 98.4°F | Ht 64.0 in | Wt 206.0 lb

## 2013-06-18 DIAGNOSIS — N9089 Other specified noninflammatory disorders of vulva and perineum: Secondary | ICD-10-CM

## 2013-06-18 DIAGNOSIS — R21 Rash and other nonspecific skin eruption: Secondary | ICD-10-CM

## 2013-06-18 DIAGNOSIS — Z01419 Encounter for gynecological examination (general) (routine) without abnormal findings: Secondary | ICD-10-CM

## 2013-06-18 DIAGNOSIS — N898 Other specified noninflammatory disorders of vagina: Secondary | ICD-10-CM

## 2013-06-18 MED ORDER — TOLNAFTATE 1 % EX POWD *I*
Freq: Two times a day (BID) | CUTANEOUS | Status: AC
Start: 2013-06-18 — End: ?

## 2013-06-18 MED ORDER — LIDOCAINE HCL 2 % EX JELLY WRAPPED *I*
CUTANEOUS | Status: AC | PRN
Start: 2013-06-18 — End: ?

## 2013-06-18 NOTE — Progress Notes (Signed)
Subjective:      Gloria Ray is a 58 y.o. postmenopausal female who presents for an annual exam. The patient has complaints of vaginal itching that started several weeks ago.    1) Vaginal itching  Started several weeks ago  Her doctor gave her a pill but it did not help  "itches so bad I think I took the skin off"  Last unprotected intercourse couple months ago  Denies risk of STIs  Denies vaginal discharge, pelvic pain, hematuria    GYN History  LMP:  years ago  Menses:postmenopausal  Cramps: None  Menarche: 58 yo: [place]: 58 yo  Last pap smear: Date: unknown Results: n/a  History of abnormal pap smear: "years ago" Previous abnormality: unknown  The patient is not sexually active.   Sexually active: not sexually active  Together with partner: single  STD History: cannot recall. Thinks she had one when she was younger, does not remember.  Current contraception: post menopausal status  Last Coitus: couple months ago      Obstetric History     No data available       Screening History:  Gyn screening history: last pap: unknown  The patient is not currently taking hormone replacement therapy. Patient denies post-menopausal vaginal bleeding.  The patient wears seatbelts: no. Rides in the back of a cab, does not put seatbelt on. Recommended seatbelt at all times.  The patient participates in regular exercise: "when I can"  Has the patient ever been transfused or tattooed?:no  Lives alone  Domestic violence:No    Gloria Ray  has past surgical history that includes Coronary angioplasty with stent and Bunionectomy.  Her family history includes Alcohol abuse in her father; Arthritis in her sister; Coronary art dis in an other family member; Diabetes in her brother, mother, and another family member; Heart disease in an other family member; Hypertension in her mother and another family member; Other in her brother and father; Substance abuse in her brother and sister.  Gloria Ray     Review of Systems  Pertinent items are  noted in HPI.      Objective:      BP 139/75    Pulse 78    Temp(Src) 36.9 C (98.4 F) (Temporal)    Ht 1.626 m (5\' 4" )    Wt 93.441 kg (206 lb)    BMI 35.34 kg/m2      SpO2 99%   General appearance: alert, cooperative and no distress  Neck: no adenopathy, no carotid bruit, no JVD, supple, symmetrical, trachea midline and thyroid not enlarged, symmetric, no tenderness/mass/nodules  Back: symmetric, no curvature, ROM normal. No CVA tenderness  Lungs: clear to auscultation bilaterally  Breasts: normal appearance, no masses or tenderness, No nipple retraction or dimpling, No nipple discharge or bleeding  Heart: regular rate and rhythm, S1, S2 normal, no murmur, click, rub or gallop  Abdomen: soft, non-tender; bowel sounds normal; no masses,  no organomegaly  Pelvic: cervix normal in appearance, no adnexal masses or tenderness, no cervical motion tenderness and uterus normal size, shape, and consistency  Skin: bilateral breast folds with excoriation and odor. Other area of skin normal    Genitalia:  Vulvar excoriated with fissures and superficial ulceration.   Vagina: pale with white homogenous discharge.      Assessment:      Gloria Ray is a 58 y.o. female who presents for an annual gyn exam. She has the following concerns: 1) vaginal itching 2) vulvar fissures/ulceration- Doubt HSV,  likely d/t yeast. 2) Rash below bilateral breasts.  -Needs routine screening mammography- declined  -Declined STI screen    Differentials: STI, yeast, atrophic vaginitis, HSV (doubt)      Plan:      - Lidocaine (Xylocaine) 2% jelly. Apply topically as needed to affected areas for discomfort, disp #30 ml, Refill-0.    -Tolnaftate (Anti-fungal) 1% powder. Apply topically 2 times daily, disp 45 g, Refill-0.    - HPV DNA, gyn cytology  - HSV culture (labia)  -vaginitis screen      Follow up as needed    Note dictated using Dragon.  Reasonable attempt at proofreading made, errors may still be present.

## 2013-06-19 LAB — VAGINITIS SCREEN: DNA PROBE: Vaginitis Screen:DNA Probe: 0

## 2013-06-20 LAB — HPV DNA PROBE WITH CYTOLOGY
HPV Other High Risk: NEGATIVE
HPV Type 16: NEGATIVE
HPV Type 18: NEGATIVE

## 2013-06-20 LAB — GYN CYTOLOGY

## 2013-06-22 ENCOUNTER — Encounter: Payer: Self-pay | Admitting: Family Medicine

## 2013-06-23 ENCOUNTER — Other Ambulatory Visit: Payer: Self-pay | Admitting: Family Medicine

## 2013-06-23 LAB — HERPES SIMPLEX VIRUS CULTURE: Herpes Simplex Virus Culture: 0

## 2013-06-27 ENCOUNTER — Encounter: Payer: Self-pay | Admitting: Gastroenterology

## 2013-07-04 ENCOUNTER — Encounter: Payer: Self-pay | Admitting: Family Medicine

## 2013-07-04 ENCOUNTER — Ambulatory Visit
Admit: 2013-07-04 | Discharge: 2013-08-03 | Disposition: A | Discharge status: Home / Self Care | Payer: Self-pay | Source: Ambulatory Visit | Attending: Psychiatry | Admitting: Psychiatry

## 2013-07-04 ENCOUNTER — Ambulatory Visit: Payer: Self-pay | Admitting: Family Medicine

## 2013-07-04 VITALS — BP 105/66 | HR 85 | Temp 97.9°F | Ht 64.17 in | Wt 206.0 lb

## 2013-07-04 DIAGNOSIS — M1710 Unilateral primary osteoarthritis, unspecified knee: Secondary | ICD-10-CM

## 2013-07-04 DIAGNOSIS — IMO0001 Reserved for inherently not codable concepts without codable children: Secondary | ICD-10-CM

## 2013-07-04 DIAGNOSIS — M179 Osteoarthritis of knee, unspecified: Secondary | ICD-10-CM

## 2013-07-04 DIAGNOSIS — L02412 Cutaneous abscess of left axilla: Secondary | ICD-10-CM

## 2013-07-04 MED ORDER — MORPHINE SULFATE ER 30 MG PO TBCR *I*
30.0000 mg | ORAL_TABLET | Freq: Two times a day (BID) | ORAL | Status: DC | PRN
Start: 2013-07-04 — End: 2013-07-15

## 2013-07-04 MED ORDER — INSULIN GLARGINE 100 UNIT/ML SC SOPN *I*
90.0000 [IU] | PEN_INJECTOR | Freq: Every evening | SUBCUTANEOUS | Status: DC
Start: 2013-07-04 — End: 2013-08-15

## 2013-07-04 NOTE — Progress Notes (Signed)
Assessment & Plan        Gloria Ray is a 58 y.o. female and  has a past medical history of Asthma; COPD (chronic obstructive pulmonary disease); Coronary artery disease; Diabetes mellitus; Hypertension; Ischemic Stroke (03/06/2006); Prior Myocardial Infarction (03/19/2007); Unspecified cerebral artery occlusion with cerebral infarction; and Arthritis.       The patient evaluated today for: Cellulitis      Abscess of axilla, left  -   Continue with hot compresses and expression of purulent material  -    Return if increasing in size and not draining  -    Needs improved control of DM, discussed     DJD (degenerative joint disease) of knee, bilateral  - morphine (MS CONTIN) 30 MG 12 hr tablet; Take 1 tablet (30 mg total) by mouth 2 times daily as needed for Severe Pain   Max daily dose: 60 mg    Type II, uncontrolled.  Best HbA1c is 8.3 (05/2011)  - insulin glargine (LANTUS SOLOSTAR) 100 UNIT/ML injection pen; Inject 90 Units into the skin nightly     -     Currently only taking 60 and likely reason for poorly controlled DM.  Increase by 5 Units daily until fasting < 250 and 2 Units daily until fasting <150; goal of 90 Units qhs (split in 2 sites, 45 Units each)  -      Coohrdinate with ome nursing to monitor BGs and help with Lantus titration  -      Consider transition to U500  -      May benefit from [facility] or Endocrinologist- high likelyhood of no-show or non-adherence  -     F/u 1 mo    Other Orders, refill per request  - ammonium lactate (AMLACTIN) 12 % cream;   - buPROPion (WELLBUTRIN XL) 150 MG 24 hr tablet;  -     Thick-It          Subjective                      Chief Complaint: Cellulitis    HPI  Abscess of axilla, left  Course:  new  Onset:  2 days ago  Description:  Hard tender bump of L axilla, started draining foul smelling pus when squeezed on shower  Inciting events/factors:  none  Exacerbated by:  Touch (pain)  Relieved by:  Warm shower  Associated symptoms:  Denies fever, chills,  spreading redness     DJD (degenerative joint disease) of knee, bilateral       Wants to be more active with warmer weather, has been and losing weight with pain treated with morphine.  Does not like the idea of more injections, knees hurt more a few days after last injection.  Does not want surgery, knows she is a poor candidate due to uncontrolled diabetes.    Type II, uncontrolled       Only taking 60 Units of Lantus since last hospitalization, not making good choices for diet and aware.  In some ways giving up.  Denies headache, vision changes, excessive thirst, increased urination, nausea, vomiting, chest pain, palpitations, SOB, dizziness, loss of consciousness or loss of motor function.     Other Orders      Requesting refills of some meds, see A/P.    History       Reviewed and updated where appropriate based on HPI and patient report.      ROS  Pertinent positives and negatives of a 12-point review of systems noted above.       Objective   Recorded Vital Signs:             BP 105/66    Pulse 85    Temp(Src) 36.6 C (97.9 F) (Temporal)    Ht 1.63 m (5' 4.17")    Wt 93.441 kg (206 lb)    BMI 35.17 kg/m2                  Wt Readings from Last 7 Encounters:   07/04/13 93.441 kg (206 lb)   06/18/13 93.441 kg (206 lb)   06/11/13 96.616 kg (213 lb)   04/16/13 100.699 kg (222 lb)   03/19/13 102.967 kg (227 lb)   03/01/13 100.699 kg (222 lb)   01/24/13 105.688 kg (233 lb)       Physical Exam   VITALS: Reviewed above and BP well controlled.  BMI category is moderate obesity,   GEN: Well developed, well nourished, appears stated age, no acute distress  HEENT: Atraumatic.  EOM normal, conjunctiva clear, no blepharitis.  Moist mucus membranes, no oropharyngeal lesions.  Trachea midline, no lymphadenopathy or thyromegaly.  RESP: Baseline labored and shallow breaths.  Lungs are clear to auscultation bilaterally with good effort.  No crackles, rales, or wheezes appreciated.  CV: Regular rhythm, S1, S2.  No S3, S4,  murmurs or rubs.   ABD: Bowel sounds normal.  Soft, non-tender, non-distended.   EXT: Draining abscess under L axilla.  Warm and well perfused.  No edema.   SKIN: Warm and dry. No rashes, ecchymoses, or petechiae of exposed surfaces  NEURO:  Alert.  Cranial nerves grossly intact.  Strength is normal and symmetric.  Gait is normal.  Balance is normal.    PSYCH:  Patient well dressed and well groomed, sitting in chair.  Speech is normal.  Mood is depressed.  Affect is congruent with mood.  Thought content is linear with no expressed paranoia, delusions, or hallucinations.  Judgement is poor.  Insight is fair.     Labs  Reviewed:  Lab Results   Component Value Date    HA1C 14.1* 04/16/2013    HA1C 11.4* 11/13/2012    HA1C 12.2* 06/26/2012    MALBR 12.49 12/09/2012    CREAT 1.12* 04/16/2013    LDLC see below 04/16/2013           Note in APSO format, see top for Assessment & Plan.     Everlean PattersonMatthew Bernadett Milian MD, PhD   [facility]   07/04/2013  3:32 PM       --------------------    Pre[facility] Attestation - [facility]     I have reviewed the history, exam findings, and plan of care and discussed the care plan with the resident at the time of the visit. I agree with the resident's findings and plan of care as documented above.    - axillary abcess  - Diabetes- out of control  - HTN-    Iven FinnJanneke Volkert, MD

## 2013-07-07 ENCOUNTER — Telehealth: Payer: Self-pay

## 2013-07-07 ENCOUNTER — Other Ambulatory Visit: Payer: Self-pay | Admitting: Family Medicine

## 2013-07-07 NOTE — Telephone Encounter (Signed)
FYI to DR: Pt did NOT transmit tele-health vitals X3 days. No reason other than "too busy". Vitals today were BP: 135/93; HR: 80. Pt denies symptoms and REFUSES to recheck vitals as requested.

## 2013-07-11 ENCOUNTER — Ambulatory Visit: Payer: Self-pay | Admitting: Family Medicine

## 2013-07-15 ENCOUNTER — Ambulatory Visit: Payer: Self-pay | Admitting: Family Medicine

## 2013-07-15 VITALS — BP 144/76 | HR 80 | Temp 98.1°F | Ht 64.17 in | Wt 210.0 lb

## 2013-07-15 DIAGNOSIS — L0291 Cutaneous abscess, unspecified: Secondary | ICD-10-CM

## 2013-07-15 DIAGNOSIS — T17998D Other foreign object in respiratory tract, part unspecified causing other injury, subsequent encounter: Secondary | ICD-10-CM

## 2013-07-15 DIAGNOSIS — M179 Osteoarthritis of knee, unspecified: Secondary | ICD-10-CM

## 2013-07-15 DIAGNOSIS — IMO0001 Reserved for inherently not codable concepts without codable children: Secondary | ICD-10-CM

## 2013-07-15 DIAGNOSIS — M1710 Unilateral primary osteoarthritis, unspecified knee: Secondary | ICD-10-CM

## 2013-07-15 MED ORDER — MORPHINE SULFATE ER 30 MG PO TBCR *I*
30.0000 mg | ORAL_TABLET | Freq: Two times a day (BID) | ORAL | Status: DC | PRN
Start: 2013-07-15 — End: 2013-07-31

## 2013-07-15 MED ORDER — STARCH (THICKENING) PO PACK *A*
PACK | ORAL | Status: AC
Start: 2013-07-15 — End: ?

## 2013-07-15 MED ORDER — GABAPENTIN 300 MG PO CAPS
900.0000 mg | ORAL_CAPSULE | Freq: Every evening | ORAL | Status: DC
Start: 2013-07-15 — End: 2013-08-21

## 2013-07-15 NOTE — Progress Notes (Addendum)
Assessment & Plan        Gloria Ray is a 58 y.o. female and  has a past medical history of Asthma; COPD (chronic obstructive pulmonary disease); Coronary artery disease; Diabetes mellitus; Hypertension; Ischemic Stroke (03/06/2006); Prior Myocardial Infarction (03/19/2007); Unspecified cerebral artery occlusion with cerebral infarction; and Arthritis.       The patient evaluated today for: Knee Pain and Cyst      Abscess  - I&D performed, see procedure notes for details  -     Aerobic culture of drainage from L axilla abscess  - US chest R axilla of fluctuant area    Type II, uncontrolled  -     Continue to titrate up Lantus per instructions at last visit, monitor BGs         Subjective                      Chief Complaint: Knee Pain and Cyst    HPI  Abscess       L axilla abscess increasing in size, still draining some.  New fluctuance in R axilla. Denies fever or chills    Type II, uncontrolled       Titrating up Lantus, BGs still high.  Denies headache, vision changes, excessive thirst, increased urination, nausea, vomiting, chest pain, palpitations, SOB, dizziness, loss of consciousness or loss of motor function.       History       Reviewed and updated where appropriate based on HPI and patient report.      ROS       Pertinent positives and negatives of a 12-point review of systems noted above.       Objective   Recorded Vital Signs:             BP 144/76    Pulse 80    Temp(Src) 36.7 C (98.1 F) (Temporal)    Ht 1.63 m (5' 4.17")    Wt 95.255 kg (210 lb)    BMI 35.85 kg/m2                  Wt Readings from Last 7 Encounters:   07/15/13 95.255 kg (210 lb)   07/04/13 93.441 kg (206 lb)   06/18/13 93.441 kg (206 lb)   06/11/13 96.616 kg (213 lb)   04/16/13 100.699 kg (222 lb)   03/19/13 102.967 kg (227 lb)   03/01/13 100.699 kg (222 lb)       Physical Exam   VITALS: Reviewed above and elevated BP.  BMI category is moderate obesity.  GEN: Well developed, well nourished, appears stated age, no acute  distress  HEENT: Atraumatic.  EOM normal, conjunctiva clear, no blepharitis.  Moist mucus membranes, no oropharyngeal lesions.  Trachea midline, no lymphadenopathy or thyromegaly.  RESP: Unlabored breaths.  Lungs are clear to auscultation bilaterally with good effort.  No crackles, rales, or wheezes appreciated.  CV: Regular rhythm, S1, S2.  No S3, S4, murmurs or rubs.   ABD: Bowel sounds normal.  Soft, non-tender, non-distended.   EXT: Warm and well perfused.  No edema.   SKIN: Draining abscess of L axilla, but enlarged from last visit.  R axilla with tender and fluctuant areas, largest is at lateral border, no skin involvement.         Note in APSO format, see top for Assessment & Plan.     Everlean PattersonMatthew Heckman MD, PhD   [facility]   07/15/2013  3:35 PM       --------------------    Preceptor Attestation - Patient Seen    I saw and evaluated the patient. I agree with the resident's/fellow's findings and plan of care as documented above.   I was present for entire procedure;  Pt left the office in stable condition;  Alert s/s emphasized;     Alcus DadSUZANNE M Lahari Suttles, MD

## 2013-07-16 LAB — GRAM STAIN

## 2013-07-17 NOTE — Progress Notes (Signed)
Quick Note:    A: Abnormal results noted, non-urgent.  P:   - May discuss normal and abnormal values at next visit   - May call/MyChart message MD with questions/concerns  ______

## 2013-07-17 NOTE — Progress Notes (Signed)
Quick Note:    A: Anticipating results of ultrasound, appears to be Staph abscesses, anticipate MRSA  P:   - Will consider covering with antibiotics and referral to surgery, anticipating results of ultrasound   - Will call patient this afternoon for update and inform of ultrasound results   - May call/MyChart message MD with questions/concerns  ______

## 2013-07-19 ENCOUNTER — Telehealth: Payer: Self-pay | Admitting: Family Medicine

## 2013-07-19 LAB — AEROBIC CULTURE

## 2013-07-19 MED ORDER — SULFAMETHOXAZOLE-TMP DS 800-160 MG PO TABS *A*
1.0000 | ORAL_TABLET | Freq: Two times a day (BID) | ORAL | Status: DC
Start: 2013-07-19 — End: 2013-07-22

## 2013-07-19 NOTE — Progress Notes (Signed)
Quick Note:    A: Results from office visit, abscess MSSA.   Sensitive to Bactrim, oxacillin, and fluoroquinolones, resistant to clindamycin   Current attempts to reach patient on listed phone numbers are unsuccessful, last tried 07/19/2013 7:46 PM and emergency contact notified.  P:   - Sending Bactrim DS to pharmacy, patient may take if progression or alert symptoms noted   - Wil continue to try to reach patient, anticipate ultrasound results next week   - May call/MyChart message MD with questions/concerns  ______

## 2013-07-19 NOTE — Telephone Encounter (Signed)
Attempted to call patient, busy signal.  Will plan to call back later today.    Gloria PattersonMatthew Gloria Dunleavy MD, PhD  Family Medicine, [facility]  07/19/2013 11:28 AM

## 2013-07-19 NOTE — Progress Notes (Signed)
Quick Note:    A: Staph aureus, pending sensitivities. Attempts to call patient unsuccessful, phone off. Called Emergency Contact with message to call back or have patient call.  P:   - Will send Pt antibiotics to take if develops cellulitis, fever, or progression. Likely better to go to ED for eval for I&D vs debridement.   - May call/MyChart message MD with questions/concerns  ______

## 2013-07-21 ENCOUNTER — Other Ambulatory Visit: Payer: Self-pay | Admitting: Family Medicine

## 2013-07-22 ENCOUNTER — Encounter: Payer: Self-pay | Admitting: Gastroenterology

## 2013-07-22 ENCOUNTER — Telehealth: Payer: Self-pay | Admitting: Family Medicine

## 2013-07-22 ENCOUNTER — Encounter: Payer: Self-pay | Admitting: Primary Care

## 2013-07-22 ENCOUNTER — Ambulatory Visit: Payer: Self-pay | Admitting: Primary Care

## 2013-07-22 ENCOUNTER — Encounter: Payer: Self-pay | Admitting: Family Medicine

## 2013-07-22 VITALS — BP 127/74 | HR 84 | Temp 98.1°F | Ht 64.17 in | Wt 216.0 lb

## 2013-07-22 DIAGNOSIS — L0291 Cutaneous abscess, unspecified: Secondary | ICD-10-CM

## 2013-07-22 MED ORDER — SULFAMETHOXAZOLE-TMP DS 800-160 MG PO TABS *A*
1.0000 | ORAL_TABLET | Freq: Two times a day (BID) | ORAL | Status: AC
Start: 2013-07-22 — End: 2013-07-29

## 2013-07-22 NOTE — Progress Notes (Signed)
[facility]    Subjective    1. Abscess  - Seen 1 week prior for I&D of abscess under L arm.  Following up today due to concern of new swelling and drainage under R arm.  Drainage is malodorous, she is unsure of the appearance. She does not believe she has had any fevers or chills.  Home nursing recently visited with recommendation to start an antibiotic.  - Spoke with her CCP last evening who informed her of availability of Bactrim RX.  Patient had been unreachable previously.  - She was also instructed to obtain an ultrasound of separate area on her R upper back, this has now been scheduled.    ROS: as per HPI    I personally reviewed the patients medication and problem list in Epic today immediatly prior and/or during our visit.    Objective    Filed Vitals:    07/22/13 1130   BP: 127/74   Pulse: 84   Temp: 36.7 C (98.1 F)   Height: 1.63 m (5' 4.17")   Weight: 97.977 kg (216 lb)       Physical Exam:    GEN: Cooperative with exam, NAD  DERM: Notable for 3 cm x 2 cm area of induration and tenderness in R axilla with active purulent drainage.  Able to express approximately 10cc further.  No surrounding fluctuance or redness.    Assessment & Plan:    1. Abscess  - In R axilla with active drainage, small amount further expressed today in exam room.  - Advised to head to pharmacy following appointment to pick up Bactrim and begin this afternoon.  - F/U in 1 week with CCP as previously scheduled, or sooner for increasing pain or fever.  - U/S scheduled for 08/05/13.      Raigan Baria, MD 12:10 PM  07/22/2013  Family Medicine

## 2013-07-22 NOTE — Procedures (Signed)
Incision and Drainage Procedure Note    Pre-operative Diagnosis: Abscess    Post-operative Diagnosis: same    Indications: Not draining effectively    Anesthesia: 1% plain lidocaine    Procedure Details   The procedure, risks and complications have been discussed in detail (including, but not limited to airway compromise, infection, bleeding) with the patient, and the patient has signed consent to the procedure.    The patient was prepped in the usual fashion with chlorhexadine. The area was anesthetized with about 5 ml of 1% plain lidocaine. An incision was then carried out. The abscess was opened. about 50 ml of purulent material was drained. The wound dressed with gauze.  There were no complications. The patient tolerated the procedure well.    EBL: <1 cc's    Drains: none    Attending: Dr. Oran ReinSuzanne Piotrowski    Shanyiah Conde MD, PhD  Family Medicine, [facility]

## 2013-07-22 NOTE — Telephone Encounter (Signed)
Patient calling to request letter, medical need for air conditioner.  She has not obtained ordered Ultrasound.  She reports new drainage from abscess under R arm.  She reports that she has an appointment 5/19 in the morning.  She has not obtained antibiotics.  She reports that she may have had a brief fever but not other symptoms, though abscesses do seem to be getting bigger except the one I&D'd under L arm.    [facility].  F/u with Dr. Tresa ResLavender at 11:30.  Phone number updated in system, other numbers need to be deleted.    Everlean PattersonMatthew Tamaria Dunleavy MD, PhD  Family Medicine, [facility]  07/22/2013 7:59 AM

## 2013-07-23 ENCOUNTER — Telehealth: Payer: Self-pay

## 2013-07-23 NOTE — Telephone Encounter (Signed)
FYI: pt has not transmitted vitals in two days.

## 2013-07-23 NOTE — Telephone Encounter (Signed)
Nannette called from [facility] stated patient refused service last week an she is attemping to start back up this week.

## 2013-07-25 ENCOUNTER — Telehealth: Payer: Self-pay

## 2013-07-25 NOTE — Telephone Encounter (Signed)
[facility] called stated she was not able to complete a social work Database administrator.  Will follow up next week.

## 2013-07-31 ENCOUNTER — Other Ambulatory Visit: Payer: Self-pay | Admitting: Gastroenterology

## 2013-07-31 ENCOUNTER — Ambulatory Visit: Payer: Self-pay | Admitting: Family Medicine

## 2013-07-31 ENCOUNTER — Encounter: Payer: Self-pay | Admitting: Emergency Medicine

## 2013-07-31 ENCOUNTER — Telehealth: Payer: Self-pay | Admitting: Family Medicine

## 2013-07-31 ENCOUNTER — Encounter: Payer: Self-pay | Admitting: Family Medicine

## 2013-07-31 ENCOUNTER — Other Ambulatory Visit: Payer: Self-pay | Admitting: Family Medicine

## 2013-07-31 VITALS — BP 76/53 | HR 72 | Temp 96.4°F | Ht 64.17 in | Wt 214.0 lb

## 2013-07-31 DIAGNOSIS — IMO0001 Reserved for inherently not codable concepts without codable children: Secondary | ICD-10-CM

## 2013-07-31 DIAGNOSIS — M1710 Unilateral primary osteoarthritis, unspecified knee: Secondary | ICD-10-CM

## 2013-07-31 DIAGNOSIS — M179 Osteoarthritis of knee, unspecified: Secondary | ICD-10-CM

## 2013-07-31 DIAGNOSIS — I959 Hypotension, unspecified: Secondary | ICD-10-CM

## 2013-07-31 DIAGNOSIS — F419 Anxiety disorder, unspecified: Secondary | ICD-10-CM

## 2013-07-31 DIAGNOSIS — F32A Depression, unspecified: Secondary | ICD-10-CM

## 2013-07-31 LAB — POCT URINALYSIS DIPSTICK
Blood,UA POCT: NEGATIVE
Blood,UA POCT: NEGATIVE
Glucose,UA POCT: 1000 — AB
Glucose,UA POCT: 1000 — AB
Ketones,UA POCT: NEGATIVE
Ketones,UA POCT: NEGATIVE
Leuk Esterase,UA POCT: 1 — AB
Leuk Esterase,UA POCT: 2 — AB
Lot #: 23302202
Lot #: 23302202
Nitrite,UA POCT: POSITIVE — AB
Nitrite,UA POCT: POSITIVE — AB
PH,UA POCT: 5 (ref 5–8)
PH,UA POCT: 5 (ref 5–8)

## 2013-07-31 LAB — CBC AND DIFFERENTIAL
Baso # K/uL: 0 10*3/uL (ref 0.0–0.1)
Basophil %: 0.3 % (ref 0.1–1.2)
Eos # K/uL: 0.2 10*3/uL (ref 0.0–0.4)
Eosinophil %: 2.6 % (ref 0.7–5.8)
Hematocrit: 35 % (ref 34–45)
Hemoglobin: 10.9 g/dL — ABNORMAL LOW (ref 11.2–15.7)
Lymph # K/uL: 2.6 10*3/uL (ref 1.2–3.7)
Lymphocyte %: 42.4 % (ref 19.3–51.7)
MCH: 26 pg (ref 26–32)
MCHC: 31 g/dL — ABNORMAL LOW (ref 32–36)
MCV: 82 fL (ref 79–95)
Mono # K/uL: 0.4 10*3/uL (ref 0.2–0.9)
Monocyte %: 5.6 % (ref 4.7–12.5)
Neut # K/uL: 3 10*3/uL (ref 1.6–6.1)
Platelets: 219 10*3/uL (ref 160–370)
RBC: 4.3 MIL/uL (ref 3.9–5.2)
RDW: 15.9 % — ABNORMAL HIGH (ref 11.7–14.4)
Seg Neut %: 48.9 % (ref 34.0–71.1)
WBC: 6.2 10*3/uL (ref 4.0–10.0)

## 2013-07-31 LAB — RUQ PANEL (ED ONLY)
ALT: 19 U/L (ref 0–35)
AST: 16 U/L (ref 0–35)
Albumin: 4 g/dL (ref 3.5–5.2)
Alk Phos: 78 U/L (ref 35–105)
Amylase: 40 U/L (ref 28–100)
Bilirubin,Direct: <0.2 mg/dL (ref 0.0–0.3)
Bilirubin,Total: <0.2 mg/dL (ref 0.0–1.2)
Globulin: 2.8 g/dL (ref 2.7–4.3)
Lipase: 65 U/L — ABNORMAL HIGH (ref 13–60)
Total Protein: 6.8 g/dL (ref 6.3–7.7)

## 2013-07-31 LAB — BASIC METABOLIC PANEL
Anion Gap: 15 (ref 7–16)
CO2: 20 mmol/L (ref 20–28)
Calcium: 8.7 mg/dL (ref 8.6–10.2)
Chloride: 101 mmol/L (ref 96–108)
Creatinine: 2.04 mg/dL — ABNORMAL HIGH (ref 0.51–0.95)
GFR,Black: 31 * — AB
GFR,Caucasian: 27 * — AB
Glucose: 167 mg/dL — ABNORMAL HIGH (ref 60–99)
Lab: 30 mg/dL — ABNORMAL HIGH (ref 6–20)
Potassium: 4.5 mmol/L (ref 3.3–5.1)
Sodium: 136 mmol/L (ref 133–145)

## 2013-07-31 LAB — POCT GLUCOSE: Glucose POCT: 148 mg/dL — ABNORMAL HIGH (ref 60–99)

## 2013-07-31 MED ORDER — MORPHINE SULFATE ER 30 MG PO TBCR *I*
30.0000 mg | ORAL_TABLET | Freq: Two times a day (BID) | ORAL | Status: DC | PRN
Start: 2013-07-31 — End: 2013-08-15

## 2013-07-31 MED ORDER — SODIUM CHLORIDE 0.9 % IV SOLN WRAPPED *I*
125.0000 mL/h | Status: DC
Start: 2013-07-31 — End: 2013-08-03
  Administered 2013-07-31 – 2013-08-03 (×4): 125 mL/h via INTRAVENOUS

## 2013-07-31 MED ORDER — SODIUM CHLORIDE 0.9 % IV BOLUS *I*
1000.0000 mL | Freq: Once | Status: DC
Start: 2013-07-31 — End: 2013-07-31

## 2013-07-31 MED ORDER — SODIUM CHLORIDE 0.9 % IV BOLUS *I*
1000.0000 mL | Freq: Once | Status: AC
Start: 2013-07-31 — End: 2013-07-31
  Administered 2013-07-31: 1000 mL via INTRAVENOUS

## 2013-07-31 MED ORDER — IPRATROPIUM-ALBUTEROL 0.5-2.5 MG/3ML IN SOLN *I*
3.0000 mL | Freq: Once | RESPIRATORY_TRACT | Status: AC
Start: 2013-07-31 — End: 2013-07-31
  Administered 2013-07-31: 3 mL via RESPIRATORY_TRACT
  Filled 2013-07-31: qty 3

## 2013-07-31 NOTE — Progress Notes (Addendum)
Assessment & Plan        Gloria Ray is a 58 y.o. female and  has a past medical history of Asthma; COPD (chronic obstructive pulmonary disease); Coronary artery disease; Diabetes mellitus; Hypertension; Ischemic Stroke (03/06/2006); Prior Myocardial Infarction (03/19/2007); Unspecified cerebral artery occlusion with cerebral infarction; and Arthritis.       The patient evaluated today for: Knee Pain      Hypotension  -     Recommended patient be transported from clinic to ED for further evaluation of etiology of hypotension, educated regarding concern for sepsis.  -     Patient declines transport to ED, acknowledges potential life threatening condition but feels fine, reports has grandkids at home and needs to make arrangements.  Will call ambulance if starts to feel worse.   -     PO hydration initiated in office, patient to continue aggressive PO hydration until further notice    Type II, uncontrolled.  BG 146 on POCT testing is better than normal for patient- unable to urinate for ketone analysis but unlikely given relatively good glucose control at this time.  - POCT glucose done in office, reviewed  - POCT urinalysis dipstick ordered, patient unable to urinate.    DJD (degenerative joint disease) of knee, bilateral.  Will continue to wean and prescribe only on a PRN basis.  As previously- prescribed to improve mobility and function and patient has improved with this plan, however she is not seeking Ortho consultation and more definitive treatment as agreed.    - morphine (MS CONTIN) 30 MG 12 hr tablet; Take 1 tablet (30 mg total) by mouth 2 times daily as needed for Severe Pain   Max daily dose: 60 mg 15 day supply, weaning         Subjective                      Chief Complaint: Knee Pain    HPI  Hypotension        Noted on VS at visit.  Patient endorses some dizziness on standing, denies falls.  Denies fever or chills.  Reports that mass under R arm seems to be enlarging, others in R and L axilla have  drained and resolved.    Type II, uncontrolled       Patient reports taking Lantus and Novolog as prescribed, but declines to quantitate adherence.  She is drinking [facility] in the exam room, reports that she did not have breakfast this AM.  Denies headache, vision changes, nausea, vomiting, chest pain, palpitations, SOB, loss of consciousness or loss of motor function.     DJD (degenerative joint disease) of knee, bilateral       Requesting refill of morphine, is able to walk and move around more on days when she takes it- takes 1-3 tabs daily depending on day.  Did achieve some weight loss due to improved activity.  Reports that she spends much of day in house or using wheelchair when not taking.    History       Reviewed and updated where appropriate based on HPI and patient report.      ROS       Pertinent positives and negatives of a 12-point review of systems noted above.       Objective   Recorded Vital Signs:             BP 76/53    Pulse 72    Temp(Src) 35.8  C (96.4 F) (Temporal)    Ht 1.63 m (5' 4.17")    Wt 97.07 kg (214 lb)    BMI 36.54 kg/m2      SpO2 98%                Wt Readings from Last 7 Encounters:   07/31/13 97.07 kg (214 lb)   07/22/13 97.977 kg (216 lb)   07/15/13 95.255 kg (210 lb)   07/04/13 93.441 kg (206 lb)   06/18/13 93.441 kg (206 lb)   06/11/13 96.616 kg (213 lb)   04/16/13 100.699 kg (222 lb)       Physical Exam   VITALS: Reviewed above and hypotensive.  BMI category is moderate obesity.  GEN: Well developed, obese, appears older than stated age, no acute distress  HEENT: Atraumatic.  EOM normal, conjunctiva clear, no blepharitis.  Dry mucus membranes,   RESP: Baseline appearance of breathlessness.  Lungs are clear to auscultation bilaterally with good effort.  No crackles, rales, or wheezes appreciated.  CV: Regular rhythm, S1, S2.  No S3, S4, murmurs or rubs.   ABD: Bowel sounds normal.  Soft, non-tender, non-distended.   EXT: Warm and well perfused.  No edema.   SKIN: Warm  and dry. No rashes, ecchymoses, or petechiae of exposed surfaces  NEURO:  Alert.  Cranial nerves grossly intact.  Strength is normal and symmetric.  Gait is slow and antalgic, using walker.  Balance is abnormal.           Note in APSO format, see top for Assessment & Plan.     Gloria Patterson MD, PhD   [facility]   07/31/2013  11:37 AM       --------------------    Preceptor Attestation - Patient Seen    I saw and evaluated the patient. I agree with the resident's/fellow's findings and plan of care as documented above.     Gloria Angst, MD

## 2013-07-31 NOTE — ED Notes (Signed)
Patient IV not flowing on IV pump. IV site flushed, flushes hard with no improvement. IV site dc'd and new site established. Patient tolerated well.

## 2013-07-31 NOTE — ED Notes (Signed)
Seen in office today for axillary cyst. Found to be hypotensive-went home, not feeling any better-continued dizziness, lethargy. Refused neb for wheeze by EMS

## 2013-07-31 NOTE — Telephone Encounter (Signed)
Patient comes to ED with hypotension, AKI with creatinine 2, from baseline, and a post axillary/chest wall mass. She was seen in office today with cc of knee pain, but sent in due to BP and mass. On ultrasound of mass:     IMPRESSION:  Heterogeneous region of somewhat thickened muscle, correlating with patient's area of concern. This finding is nonspecific and may represent inflammation. There is no evidence of focal fluid collection. If there is continued concern, consider chest CT for further assessment.    In past month was treated for an abscess of the other axilla with I & D. Currently has no fever, no tachycardia, normal WBC, but BP remains soft in spite of fluids. Admit for hypotension and await rest of results.   York Cerise, MD

## 2013-07-31 NOTE — H&P (Addendum)
Hospital Medicine Admission Note                                                Presenting Complaint:  Hypotension at PCP office    History of Present Illness      YAMIRA PAPA is a 58 y.o. female with a history of MI, CVA, HTN, and prior admissions for AKI with hypotension who presents with dizziness and hypotension detected in PCP office found in ED to have AKI.    Ms. Pfahler notes that she felt very tired and dizzy today, but denies chest pain. She has not been short of breath and denies leg swelling. She does, however, note a marked decrease in her usual urine output. She denies dysuria, hesitancy, incomplete voiding, or fever. With her dizziness, though, she has had some chills and sweats. She denies any focal neurologic deficit.    She went to her PCP for dizziness and evaluation of a r axillary mass (lipoma?) and was found to be hypotensive. PCP recommended coming to the ED, but she did not want to come in. After going home, she continued to feel very dizzy and fatigued and came in.    In the ED, she was hypotensive, but had improvement with a NS bolus. She was also given a duoneb x1.    ROS otherwise negative.    Past Medical/Surgical History     Past Medical History   Diagnosis Date    Asthma     COPD (chronic obstructive pulmonary disease)     Coronary artery disease     Diabetes mellitus     Hypertension     Ischemic Stroke 03/06/2006     Created by Conversion     Prior Myocardial Infarction 03/19/2007     Created by Conversion     Unspecified cerebral artery occlusion with cerebral infarction      2008    Arthritis         Allergies:  Allergies   Allergen Reactions    Keppra Swelling    Lisinopril      Created by Conversion - Hyperkalemia;       Medications:  Albuterol PRN  Amlodipine 10 mg daily  Gabapentin 900 mg QHS  Losartan-HCTZ 100-25 mg  Bupropion 150 mg Q24 mg  Lantus 70 units QHS (uses differently than in computer)  Lispro -- states she is on 30 units with meals  buspar 30 mg  daily  smybicort 2 puffs BID  ASA 325 mg daily  Plavix 75 mg daily  Atorvastatin 80 mg daily  spiriva inhaled daily  Combivent inhaled Q4H PRN (pt states she is on both spiriva and combivent)    States she is no longer on:  Morphine, tessalon, ammonium lactate, anti-fungal poweder, lidocaine gel      Family History     Family History   Problem Relation Age of Onset    Diabetes Other     Heart disease Other     Hypertension Other     Coronary art dis Other     Hypertension Mother     Diabetes Mother     Alcohol abuse Father     Other Father      cirrhosis    Arthritis Sister     Substance abuse Sister     Other Brother  HIV    Substance abuse Brother     Diabetes Brother         Social History     Lives at home alone, fiance around a lot  Used to smoke, quit 15 years ago  No EtOH intake  unemployed    Objective      Physical Exam  BP: (76-102)/(49-66)   Temp:  [35.7 C (96.3 F)-36.7 C (98.1 F)]   Temp src:  [-]   Heart Rate:  [64-73]   Resp:  [16-18]   SpO2:  [95 %-98 %]   Height:  [162.6 cm (5\' 4" )-163 cm (5' 4.17")]   Weight:  [97.07 kg (214 lb)]       General Constitutional:   Sitting up comfortably in bed  HEENT: edentulous, MMM  Cardiovascular:  RRR, no murmurs, rubs, or gallops; no JVD, symmetric peripheral pulses  Pulmonary:  CTA bilaterally, but poor air entry diffusely  Gastrointestinal: soft, obese, nontender  Musculoskeletal: no deformity; mild bilateral LE pitting edema (stable per pt)  Neurologic:  Grossly intact, slight R sided weakness, may be effort related (pt states she is chronically weak on R side)  Skin:  No rashes or lesions; large (10 cm) slightly mobile mass posterior to R axilla    Lab, Micro, and Imaging Studies   Personally reviewed and notable for:    WBC 6.2  H/H 10.9/35  Plt 219    Na 136  K 4.5  Cl 101  CO2 20  BUN/Cr 30/2.04 (baseline Cr 1.0-1.2, but highly variable given several labs drawn during admissions for AKI)   glucose 168    Chest soft tissue  U/S:  Heterogeneous region of somewhat thickened muscle, correlating with   patient's area of concern. This finding is nonspecific and may   represent inflammation. There is no evidence of focal fluid   collection. If there is continued concern, consider chest CT for   further assessment.      Assessment     Alfreda ADAR FERDON is a 58 y.o. female with a h/o of MI, CVA, HTN, and AKI in the past here with hypotension and AKI. AKI likely due to AKI, but UTI suggestive of infection, but clinically seems to be asymptomatic bacteruria. Uncertain cause of hypotension; may be ACS, dehydration, or simply overtreated HTN, since she regularly seems to "run out" of meds by her own report.    Plan     AKI: s/p fluid bolus, will continue mIVF, holding BP meds  - urine lytes  - maintenance fluids  - strict I/Os  - bacteruria is asymptomatic  - hold home BP meds (see below) and gabapentin  - hold home meloxicam, should avoid NSAIDs and nephrotoxic abx while inpatient    Hypotension:  - may be due to hypotension, will r/o ACS  - improved with fluids  - add on trop and CK isoenzymes, treat if positive  - add on BNP  - hold home metoprolol (pt was not taking this d/t it making her "feel funny")  - hold home amlodipine 10 mg  - hold home losartan-HCTZ 100-25 mg  - strict I/Os    DM, type II:  - will do 40 units Lantus QHS (instead of 70 units home dose)  - ISS, will wait until labs are back to see if she needs to be NPO    Diarrhea:  - hold home Miralax and colace unless not having BM  - hold home Zoloft for now, as this may be  an etiology for her diarrhea (although she hasn't had a recent dose change)    COPD:  - duonebs QID scheduled (home med)  - hold tiotropium, unsure why on two anticholinergics at home  - home albuterol PRN    F: maintenance IV fluids  E: daily  N: NPO for now while waiting for labs    Prophylaxis: heparin TID    Code Status: Full code    Disposition:  Likely 2-3 days pending lab results and etiology of hypotension  and AKI    Memory DancePatrick Ellsworth, MD  Internal Medicine - Pediatrics [facility]  Pager [phone]  07/31/2013 11:13 PM      I saw and evaluated the patient. I agree with the resident's/fellow's findings and plan of care as documented above.    To treat for concerns for urosepsis    Cont to follow stool pattern    If creatine improves then can check CT chest if still elevated to get this test as outpatient.    dispo 1-2 days    Jamori Biggar J Tulani Kidney, DO

## 2013-07-31 NOTE — ED Provider Notes (Addendum)
History     Chief Complaint   Patient presents with    Dizziness    Hypotension         History provided by:  Patient and medical records (Dr. Juliane Poot)  58 year old female went to PCP office for evaluation of possible abscess (vs soft tissue swelling/mass) adjacent to right axilla  Was hypotensive there - PCP advised her of need to go to ED but she decliend  She states she feels weak  No abd pain  No N/V/D or CP    Past Medical History   Diagnosis Date    Asthma     COPD (chronic obstructive pulmonary disease)     Coronary artery disease     Diabetes mellitus     Hypertension     Ischemic Stroke 03/06/2006     Created by Conversion     Prior Myocardial Infarction 03/19/2007     Created by Conversion     Unspecified cerebral artery occlusion with cerebral infarction      2008    Arthritis             Past Surgical History   Procedure Laterality Date    Coronary angioplasty with stent placement      Bunionectomy         Family History   Problem Relation Age of Onset    Diabetes Other     Heart disease Other     Hypertension Other     Coronary art dis Other     Hypertension Mother     Diabetes Mother     Alcohol abuse Father     Other Father      cirrhosis    Arthritis Sister     Substance abuse Sister     Other Brother      HIV    Substance abuse Brother     Diabetes Brother          Social History      reports that she quit smoking about 2 years ago. Her smoking use included Cigarettes. She smoked 0.00 packs per day. She has never used smokeless tobacco. She reports that she does not drink alcohol, use illicit drugs, or engage in sexual activity.    Living Situation    Questions Responses    Patient lives with Alone    Homeless No    Caregiver for other family member No    External Services Home Care Services    Comment: Visiting nurse     Employment Retired    Domestic Violence Risk No          Problem List     Patient Active Problem List   Diagnosis Code    Coronary Artery Disease 414.00     Hypertension 401.9    Asthma 493.90    Type II, uncontrolled 250.02    COPD (chronic obstructive pulmonary disease) 496    Dental caries 521.00    Anxiety and depression 300.4    Insomnia 780.52    Pain, dental 525.9    Bilateral presbyopia 367.4    DJD (degenerative joint disease) of knee, bilateral 715.96    Controlled substance agreement signed V58.69    Chest heaviness 786.59    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   Skin:        See HPI - re: mass   Neurological: Positive for weakness and light-headedness.   All other systems reviewed and are negative.        Physical Exam     ED Triage Vitals   BP Heart Rate Heart Rate(via Pulse Ox) Resp Temp Temp src SpO2 O2 Device O2 Flow Rate   07/31/13 1542 07/31/13 1542 -- 07/31/13 1542 07/31/13 1542 -- 07/31/13 1542 07/31/13 1542 --   97/51 mmHg 70  18 35.7 C (96.3 F)  95 % None (Room air)       Weight           07/31/13 1542           97.07 kg (214 lb)               Physical Exam   Constitutional: She is oriented to person, place, and time. No distress.   HENT:   Head: Normocephalic and atraumatic.   Mouth/Throat: Oropharynx is clear and moist.   Eyes: No scleral icterus.   Neck: Normal range of motion. Neck supple.   Cardiovascular: Normal rate and regular rhythm.    Pulmonary/Chest: Effort normal and breath sounds normal. No respiratory distress.   Abdominal: Soft. There is no tenderness. There is no rebound and no guarding.   Neurological: She is alert and oriented to person, place, and time.   Skin: She is not diaphoretic.   Lateral to right axilla - STS but firm; no wamth or erythema   Psychiatric: She has a normal mood and affect.       Medical Decision Making      Amount and/or Complexity of Data Reviewed  Clinical lab tests: ordered  Tests in the radiology section of  CPT: ordered  Discuss the patient with other providers: yes        Initial Evaluation:  ED First Provider Contact    Date/Time Event User Comments    07/31/13 1603 ED Provider First Contact Louann Hopson E Initial Face to Face Provider Contact          Patient seen by me as above    Assessment:  58 y.o., female comes to the ED with low BP, possible abscess    Differential Diagnosis includes dehydration, hypovolemia, electrolyte derangement; STS/abscess                Plan: US to assess mass; IVF, labs, reassess    Peterson LombardIMOTHY Kerrin Markman, MD              Peterson LombardLum, Pawan Knechtel, MD  07/31/13 1655    As d/w Dr. Juliane PootHeckman, if BP improves and labs wnl, may d/c  Awaiting US - may need I and D if large abscess    Peterson LombardLum, Destiney Sanabia, MD  07/31/13 1729

## 2013-08-01 ENCOUNTER — Inpatient Hospital Stay
Admit: 2013-08-01 | Disposition: A | Discharge status: Home Health | Payer: Self-pay | Source: Ambulatory Visit | Attending: Family Medicine | Admitting: Family Medicine

## 2013-08-01 ENCOUNTER — Encounter: Payer: Self-pay | Admitting: Family Medicine

## 2013-08-01 LAB — POCT GLUCOSE
Glucose POCT: 413 mg/dL — ABNORMAL HIGH (ref 60–99)
Glucose POCT: 286 mg/dL — ABNORMAL HIGH (ref 60–99)
Glucose POCT: 315 mg/dL — ABNORMAL HIGH (ref 60–99)
Glucose POCT: 356 mg/dL — ABNORMAL HIGH (ref 60–99)
Glucose POCT: 247 mg/dL — ABNORMAL HIGH (ref 60–99)

## 2013-08-01 LAB — BASIC METABOLIC PANEL
Anion Gap: 15 (ref 7–16)
Anion Gap: 13 (ref 7–16)
CO2: 20 mmol/L (ref 20–28)
CO2: 21 mmol/L (ref 20–28)
Calcium: 8.4 mg/dL — ABNORMAL LOW (ref 8.6–10.2)
Calcium: 8.7 mg/dL (ref 8.6–10.2)
Chloride: 99 mmol/L (ref 96–108)
Chloride: 102 mmol/L (ref 96–108)
Creatinine: 1.92 mg/dL — ABNORMAL HIGH (ref 0.51–0.95)
Creatinine: 1.4 mg/dL — ABNORMAL HIGH (ref 0.51–0.95)
GFR,Black: 33 * — AB
GFR,Black: 48 * — AB
GFR,Caucasian: 29 * — AB
GFR,Caucasian: 42 * — AB
Glucose: 414 mg/dL — ABNORMAL HIGH (ref 60–99)
Glucose: 332 mg/dL — ABNORMAL HIGH (ref 60–99)
Lab: 34 mg/dL — ABNORMAL HIGH (ref 6–20)
Lab: 25 mg/dL — ABNORMAL HIGH (ref 6–20)
Potassium: 4.6 mmol/L (ref 3.3–5.1)
Potassium: 4.7 mmol/L (ref 3.3–5.1)
Sodium: 134 mmol/L (ref 133–145)
Sodium: 136 mmol/L (ref 133–145)

## 2013-08-01 LAB — TROPONIN T: Troponin T: <0.01 ng/mL (ref 0.00–0.02)

## 2013-08-01 LAB — CBC AND DIFFERENTIAL
Baso # K/uL: 0 10*3/uL (ref 0.0–0.1)
Basophil %: 0.3 % (ref 0.1–1.2)
Eos # K/uL: 0.1 10*3/uL (ref 0.0–0.4)
Eosinophil %: 1.5 % (ref 0.7–5.8)
Hematocrit: 34 % (ref 34–45)
Hemoglobin: 10.5 g/dL — ABNORMAL LOW (ref 11.2–15.7)
Lymph # K/uL: 2.7 10*3/uL (ref 1.2–3.7)
Lymphocyte %: 39.6 % (ref 19.3–51.7)
MCH: 25 pg — ABNORMAL LOW (ref 26–32)
MCHC: 31 g/dL — ABNORMAL LOW (ref 32–36)
MCV: 82 fL (ref 79–95)
Mono # K/uL: 0.4 10*3/uL (ref 0.2–0.9)
Monocyte %: 5.2 % (ref 4.7–12.5)
Neut # K/uL: 3.6 10*3/uL (ref 1.6–6.1)
Platelets: 232 10*3/uL (ref 160–370)
RBC: 4.2 MIL/uL (ref 3.9–5.2)
RDW: 15.8 % — ABNORMAL HIGH (ref 11.7–14.4)
Seg Neut %: 53.1 % (ref 34.0–71.1)
WBC: 6.7 10*3/uL (ref 4.0–10.0)

## 2013-08-01 LAB — SODIUM, URINE: Sodium,UR: 58 mEq/L

## 2013-08-01 LAB — CREATININE, URINE: Creatinine,UR: 269 mg/dL (ref 20–300)

## 2013-08-01 LAB — CK ISOENZYMES
CK: 91 U/L (ref 34–145)
Mass CKMB: 2.5 ng/mL (ref 0.0–5.3)

## 2013-08-01 LAB — PROTEIN, URINE: Protein,UR: 51 mg/dL

## 2013-08-01 LAB — OSMOLALITY, URINE: Osmolality,UR: 478 mOsm/kg (ref 300–900)

## 2013-08-01 LAB — TP/CREATININE RATIO,UR: TP Creatinine ratio,UR: 0.19

## 2013-08-01 LAB — NT-PRO BNP: NT-pro BNP: <50 pg/mL (ref 0–900)

## 2013-08-01 MED ORDER — GLUCOSE 40 % PO GEL *I*
15.0000 g | ORAL | Status: DC | PRN
Start: 2013-08-01 — End: 2013-08-04

## 2013-08-01 MED ORDER — INSULIN GLARGINE 100 UNIT/ML SC SOLN *WRAPPED*
40.0000 [IU] | Freq: Every evening | SUBCUTANEOUS | Status: DC
Start: 2013-08-01 — End: 2013-08-01
  Administered 2013-08-01: 40 [IU] via SUBCUTANEOUS

## 2013-08-01 MED ORDER — GLUCAGON HCL (RDNA) 1 MG IJ SOLR *WRAPPED*
1.0000 mg | INTRAMUSCULAR | Status: DC | PRN
Start: 2013-08-01 — End: 2013-08-04

## 2013-08-01 MED ORDER — IOHEXOL 350 MG/ML (OMNIPAQUE) IV SOLN *I*
1.0000 mL | Freq: Once | INTRAVENOUS | Status: AC
Start: 2013-08-01 — End: 2013-08-01
  Administered 2013-08-01: 50 mL via INTRAVENOUS

## 2013-08-01 MED ORDER — DOCUSATE SODIUM 100 MG PO CAPS *I*
100.0000 mg | ORAL_CAPSULE | Freq: Every day | ORAL | Status: DC
Start: 2013-08-01 — End: 2013-08-04
  Administered 2013-08-01 – 2013-08-04 (×4): 100 mg via ORAL
  Filled 2013-08-01 (×3): qty 1

## 2013-08-01 MED ORDER — BUDESONIDE-FORMOTEROL FUMARATE 160-4.5 MCG/ACT IN AERO *I*
2.0000 | INHALATION_SPRAY | Freq: Two times a day (BID) | RESPIRATORY_TRACT | Status: DC
Start: 2013-08-01 — End: 2013-08-04
  Administered 2013-08-01 – 2013-08-04 (×7): 2 via RESPIRATORY_TRACT
  Filled 2013-08-01: qty 6

## 2013-08-01 MED ORDER — IPRATROPIUM-ALBUTEROL 0.5-2.5 MG/3ML IN SOLN *I*
3.0000 mL | Freq: Four times a day (QID) | RESPIRATORY_TRACT | Status: DC
Start: 2013-08-01 — End: 2013-08-04
  Administered 2013-08-01 – 2013-08-04 (×12): 3 mL via RESPIRATORY_TRACT
  Filled 2013-08-01 (×14): qty 3

## 2013-08-01 MED ORDER — INSULIN GLARGINE 100 UNIT/ML SC SOLN *WRAPPED*
50.0000 [IU] | Freq: Every evening | SUBCUTANEOUS | Status: DC
Start: 2013-08-01 — End: 2013-08-02
  Administered 2013-08-01: 50 [IU] via SUBCUTANEOUS

## 2013-08-01 MED ORDER — ATORVASTATIN CALCIUM 80 MG PO TABS *I*
80.0000 mg | ORAL_TABLET | Freq: Every day | ORAL | Status: DC
Start: 2013-08-01 — End: 2013-08-04
  Administered 2013-08-01 – 2013-08-03 (×3): 80 mg via ORAL
  Filled 2013-08-01 (×3): qty 1

## 2013-08-01 MED ORDER — ALBUTEROL SULFATE HFA 108 (90 BASE) MCG/ACT IN AERS *I*
2.0000 | INHALATION_SPRAY | Freq: Four times a day (QID) | RESPIRATORY_TRACT | Status: DC | PRN
Start: 2013-08-01 — End: 2013-08-01
  Filled 2013-08-01: qty 8

## 2013-08-01 MED ORDER — DEXTROSE 50 % IV SOLN *I*
25.0000 g | INTRAVENOUS | Status: DC | PRN
Start: 2013-08-01 — End: 2013-08-04

## 2013-08-01 MED ORDER — ACETAMINOPHEN 500 MG PO TABS *I*
1000.0000 mg | ORAL_TABLET | Freq: Three times a day (TID) | ORAL | Status: DC | PRN
Start: 2013-08-01 — End: 2013-08-04
  Administered 2013-08-01 – 2013-08-04 (×4): 1000 mg via ORAL
  Filled 2013-08-01 (×5): qty 2

## 2013-08-01 MED ORDER — INSULIN LISPRO (HUMAN) 100 UNIT/ML IJ/SC SOLN *WRAPPED*
14.0000 [IU] | Freq: Once | SUBCUTANEOUS | Status: AC
Start: 2013-08-01 — End: 2013-08-01
  Administered 2013-08-01: 14 [IU] via SUBCUTANEOUS

## 2013-08-01 MED ORDER — CLOPIDOGREL BISULFATE 75 MG PO TABS *I*
75.0000 mg | ORAL_TABLET | Freq: Every day | ORAL | Status: DC
Start: 2013-08-01 — End: 2013-08-04
  Administered 2013-08-01 – 2013-08-04 (×4): 75 mg via ORAL
  Filled 2013-08-01 (×4): qty 1

## 2013-08-01 MED ORDER — HEPARIN SODIUM 5000 UNIT/ML SQ *I*
5000.0000 [IU] | Freq: Three times a day (TID) | SUBCUTANEOUS | Status: DC
Start: 2013-08-01 — End: 2013-08-04
  Administered 2013-08-01 – 2013-08-04 (×11): 5000 [IU] via SUBCUTANEOUS
  Filled 2013-08-01 (×10): qty 1

## 2013-08-01 MED ORDER — GABAPENTIN 400 MG PO CAPS
900.0000 mg | ORAL_CAPSULE | Freq: Every evening | ORAL | Status: DC
Start: 2013-08-01 — End: 2013-08-04
  Administered 2013-08-01 – 2013-08-03 (×3): 900 mg via ORAL
  Filled 2013-08-01 (×2): qty 2
  Filled 2013-08-01: qty 1
  Filled 2013-08-01: qty 2
  Filled 2013-08-01 (×2): qty 1

## 2013-08-01 MED ORDER — INSULIN LISPRO (HUMAN) 100 UNIT/ML IJ/SC SOLN *WRAPPED*
0.0000 [IU] | Freq: Three times a day (TID) | SUBCUTANEOUS | Status: DC
Start: 2013-08-01 — End: 2013-08-04
  Administered 2013-08-01: 14 [IU] via SUBCUTANEOUS
  Administered 2013-08-01: 16 [IU] via SUBCUTANEOUS
  Administered 2013-08-01: 12 [IU] via SUBCUTANEOUS
  Administered 2013-08-02 – 2013-08-03 (×4): 14 [IU] via SUBCUTANEOUS
  Administered 2013-08-03: 12 [IU] via SUBCUTANEOUS
  Administered 2013-08-03 – 2013-08-04 (×2): 10 [IU] via SUBCUTANEOUS

## 2013-08-01 MED ORDER — SODIUM CHLORIDE 0.9 % IV SOLN WRAPPED *I*
125.0000 mL/h | Status: DC
Start: 2013-08-01 — End: 2013-08-01

## 2013-08-01 MED ORDER — PANTOPRAZOLE SODIUM 40 MG PO TBEC *I*
40.0000 mg | DELAYED_RELEASE_TABLET | Freq: Every morning | ORAL | Status: DC
Start: 2013-08-01 — End: 2013-08-04
  Administered 2013-08-01 – 2013-08-04 (×4): 40 mg via ORAL
  Filled 2013-08-01 (×3): qty 1

## 2013-08-01 MED ORDER — ALBUTEROL SULFATE (2.5 MG/3ML) 0.083% IN NEBU *I*
2.5000 mg | INHALATION_SOLUTION | RESPIRATORY_TRACT | Status: DC | PRN
Start: 2013-08-01 — End: 2013-08-04
  Administered 2013-08-01 – 2013-08-03 (×2): 2.5 mg via RESPIRATORY_TRACT
  Filled 2013-08-01: qty 3

## 2013-08-01 MED ORDER — TRAZODONE HCL 50 MG PO TABS *I*
200.0000 mg | ORAL_TABLET | Freq: Every evening | ORAL | Status: DC | PRN
Start: 2013-08-01 — End: 2013-08-04
  Administered 2013-08-01 – 2013-08-02 (×3): 200 mg via ORAL
  Filled 2013-08-01 (×3): qty 4

## 2013-08-01 MED ORDER — BUSPIRONE HCL 15 MG PO TABS *I*
30.0000 mg | ORAL_TABLET | Freq: Two times a day (BID) | ORAL | Status: DC
Start: 2013-08-01 — End: 2013-08-04
  Administered 2013-08-01 – 2013-08-04 (×7): 30 mg via ORAL
  Filled 2013-08-01 (×8): qty 2

## 2013-08-01 MED ORDER — SODIUM CHLORIDE 0.9 % IV BOLUS *I*
1000.0000 mL | Freq: Once | Status: DC
Start: 2013-08-01 — End: 2013-08-01
  Administered 2013-08-01: 1000 mL via INTRAVENOUS

## 2013-08-01 MED ORDER — CEFTRIAXONE SODIUM 1GM IN D5W DUPLEX IV *I*
1.0000 g | INTRAVENOUS | Status: DC
Start: 2013-08-01 — End: 2013-08-02
  Administered 2013-08-01: 1 g via INTRAVENOUS
  Filled 2013-08-01 (×3): qty 50

## 2013-08-01 MED ORDER — ASPIRIN 325 MG PO TABS *I*
325.0000 mg | ORAL_TABLET | Freq: Every day | ORAL | Status: DC
Start: 2013-08-01 — End: 2013-08-04
  Administered 2013-08-01 – 2013-08-04 (×4): 325 mg via ORAL
  Filled 2013-08-01 (×4): qty 1

## 2013-08-01 MED ORDER — BUPROPION HCL 150 MG PO TB24 *I*
150.0000 mg | ORAL_TABLET | Freq: Every day | ORAL | Status: DC
Start: 2013-08-01 — End: 2013-08-04
  Administered 2013-08-01 – 2013-08-04 (×4): 150 mg via ORAL
  Filled 2013-08-01 (×7): qty 1

## 2013-08-01 NOTE — Plan of Care (Signed)
Problem: Safe Discharge Barriers  Goal: Safe Discharge  Intervention: Homecare Services  Pt selected [facility] as her home care provider.  A message was left for Joya Salm of [facility] to notify her of the referral.  Encarnacion Chu, LMSW 08/01/2013 9:15 AM

## 2013-08-01 NOTE — ED Notes (Signed)
Dr. Despina Hick web paged per lab after orders placed as add ons and unable to modify.

## 2013-08-01 NOTE — Progress Notes (Signed)
[facility]    Pt receives [facility] PCA services 3 hrs/day, 7 days/week ([facility]), as well as skilled nursing and telehealth monitoring for CHF, COPD, HTN.      [facility] will follow pt's hospital course and will assist with pt's transition to home, when appropriate.      Walker Kehr, BSN RN  Care Transitions Manager for [facility]  [phone]    After hours & weekends please call [phone]

## 2013-08-01 NOTE — Progress Notes (Addendum)
 Family Medicine Progress Note    Subjective:    Interval History: no events since admission. S/p 1L NS and maintenance fluids for a total of ~3L. VSS this am.    Today, pt is quite sleepy, difficult to sustain attention for conversation. Denies any chest pain. +SOB, unchanged from yesterday. Reports diarrhea continues, had 5 episodes yesterday on pt report, none today on documentation.    Objective:    Vital signs in last 24 hours:  Temp:  [35.7 C (96.3 F)-36.9 C (98.4 F)] 36.9 C (98.4 F)  Heart Rate:  [64-91] 85  Resp:  [16-20] 18  BP: (76-125)/(49-76) 108/66 mmHg    Exam:  Gen: sleeping peacefully, in NAD  HEENT: NCAT.   Pulmonary: decreased breath sounds at the bases, mild end-expiratory wheezing. Breathing non-labored.   Heart: Normal S1 & S2. RRR. No R/M/G.  Abdomen: obese, soft, non-tender, non-distended. No masses or HSM. BS present.  Extremities: 1+ pitting edema. 2+ pedal pulses symmetric bilaterally. Calves non-tender.  Neuro: Cranial nerves II-XII grossly intact.  Mental Status: Cooperative.    Labs:  Recent Results (from the past 24 hour(s))   POCT GLUCOSE    Collection Time     07/31/13 11:54 AM       Result Value Range    Glucose POCT 148 (*) 60 - 99 mg/dL   BASIC METABOLIC PANEL    Collection Time     07/31/13  5:27 PM       Result Value Range    Glucose 167 (*) 60 - 99 mg/dL    Sodium 562  130 - 865 mmol/L    Potassium 4.5  3.3 - 5.1 mmol/L    Chloride 101  96 - 108 mmol/L    CO2 20  20 - 28 mmol/L    Anion Gap 15  7 - 16    UN 30 (*) 6 - 20 mg/dL    Creatinine 7.84 (*) 0.51 - 0.95 mg/dL    GFR,Caucasian 27 (*)     GFR,Black 31 (*)     Calcium 8.7  8.6 - 10.2 mg/dL   RUQ PANEL (ED ONLY)    Collection Time     07/31/13  5:27 PM       Result Value Range    Amylase 40  28 - 100 U/L    Lipase 65 (*) 13 - 60 U/L    Total Protein 6.8  6.3 - 7.7 g/dL    Albumin 4.0  3.5 - 5.2 g/dL    Globulin 2.8  2.7 - 4.3 g/dL    Bilirubin,Total <6.9  0.0 - 1.2 mg/dL    Bilirubin,Direct <6.2  0.0 - 0.3 mg/dL    Alk  Phos 78  35 - 952 U/L    AST 16  0 - 35 U/L    ALT 19  0 - 35 U/L   CBC AND DIFFERENTIAL    Collection Time     07/31/13  5:27 PM       Result Value Range    WBC 6.2  4.0 - 10.0 THOU/uL    RBC 4.3  3.9 - 5.2 MIL/uL    Hemoglobin 10.9 (*) 11.2 - 15.7 g/dL    Hematocrit 35  34 - 45 %    MCV 82  79 - 95 fL    MCH 26  26 - 32 pg    MCHC 31 (*) 32 - 36 g/dL    RDW 84.1 (*) 32.4 - 14.4 %  Platelets 219  160 - 370 THOU/uL    Seg Neut % 48.9  34.0 - 71.1 %    Lymphocyte % 42.4  19.3 - 51.7 %    Monocyte % 5.6  4.7 - 12.5 %    Eosinophil % 2.6  0.7 - 5.8 %    Basophil % 0.3  0.1 - 1.2 %    Neut # K/uL 3.0  1.6 - 6.1 THOU/uL    Lymph # K/uL 2.6  1.2 - 3.7 THOU/uL    Mono # K/uL 0.4  0.2 - 0.9 THOU/uL    Eos # K/uL 0.2  0.0 - 0.4 THOU/uL    Baso # K/uL 0.0  0.0 - 0.1 THOU/uL   NT-PRO BNP    Collection Time     07/31/13  5:27 PM       Result Value Range    NT-pro BNP <50  0 - 900 pg/mL   CK ISOENZYMES    Collection Time     07/31/13  5:27 PM       Result Value Range    CK 91  34 - 145 U/L    Mass CKMB 2.5  0.0 - 5.3 ng/mL   POCT URINALYSIS DIPSTICK    Collection Time     07/31/13 10:17 PM       Result Value Range    Specific gravity,UA POCT N/A  1.002 - 1.03    PH,UA POCT 5.0  5 - 8    Leuk Esterase,UA POCT +1 (*) Negative    Nitrite,UA POCT Positive (*) Negative    Protein,UA POCT ++ 100mg /dL (*) Negative mg/dL    Glucose,UA POCT 1000 mg/dL (*) Normal    Ketones,UA POCT Negative  Negative    Urobilinogen,UA        Bilirubin,Ur    Negative    Blood,UA POCT Negative  Negative    Exp date 2016-2      Lot # [id number]     POCT URINALYSIS DIPSTICK    Collection Time     07/31/13 10:22 PM       Result Value Range    Specific gravity,UA POCT N/A  1.002 - 1.03    PH,UA POCT 5.0  5 - 8    Leuk Esterase,UA POCT +2 (*) Negative    Nitrite,UA POCT Positive (*) Negative    Protein,UA POCT + 30mg /dL (*) Negative mg/dL    Glucose,UA POCT 1000 mg/dL (*) Normal    Ketones,UA POCT Negative  Negative    Urobilinogen,UA        Bilirubin,Ur     Negative    Blood,UA POCT Negative  Negative    Exp date 2016-2      Lot # [id number]     OSMOLALITY, URINE    Collection Time     08/01/13  1:22 AM       Result Value Range    Osmolality,UR 478  300 - 900 mOsm/kg   PROTEIN, URINE    Collection Time     08/01/13  1:22 AM       Result Value Range    Protein,UR 51     SODIUM, URINE    Collection Time     08/01/13  1:22 AM       Result Value Range    Sodium,UR 58     CREATININE, URINE    Collection Time     08/01/13  1:22 AM       Result Value Range  Creatinine,UR 269  20 - 300 mg/dL   TP/CREATININE RATIO,UR    Collection Time     08/01/13  1:22 AM       Result Value Range    TP Creatinine ratio,UR 0.19     POCT GLUCOSE    Collection Time     08/01/13  2:01 AM       Result Value Range    Glucose POCT 413 (*) 60 - 99 mg/dL   BASIC METABOLIC PANEL    Collection Time     08/01/13  3:04 AM       Result Value Range    Glucose 414 (*) 60 - 99 mg/dL    Sodium 440  102 - 725 mmol/L    Potassium 4.6  3.3 - 5.1 mmol/L    Chloride 99  96 - 108 mmol/L    CO2 20  20 - 28 mmol/L    Anion Gap 15  7 - 16    UN 34 (*) 6 - 20 mg/dL    Creatinine 3.66 (*) 0.51 - 0.95 mg/dL    GFR,Caucasian 29 (*)     GFR,Black 33 (*)     Calcium 8.4 (*) 8.6 - 10.2 mg/dL   CBC AND DIFFERENTIAL    Collection Time     08/01/13  3:04 AM       Result Value Range    WBC 6.7  4.0 - 10.0 THOU/uL    RBC 4.2  3.9 - 5.2 MIL/uL    Hemoglobin 10.5 (*) 11.2 - 15.7 g/dL    Hematocrit 34  34 - 45 %    MCV 82  79 - 95 fL    MCH 25 (*) 26 - 32 pg    MCHC 31 (*) 32 - 36 g/dL    RDW 44.0 (*) 34.7 - 14.4 %    Platelets 232  160 - 370 THOU/uL    Seg Neut % 53.1  34.0 - 71.1 %    Lymphocyte % 39.6  19.3 - 51.7 %    Monocyte % 5.2  4.7 - 12.5 %    Eosinophil % 1.5  0.7 - 5.8 %    Basophil % 0.3  0.1 - 1.2 %    Neut # K/uL 3.6  1.6 - 6.1 THOU/uL    Lymph # K/uL 2.7  1.2 - 3.7 THOU/uL    Mono # K/uL 0.4  0.2 - 0.9 THOU/uL    Eos # K/uL 0.1  0.0 - 0.4 THOU/uL    Baso # K/uL 0.0  0.0 - 0.1 THOU/uL        *Note: Due to a large  number of results for the requested time period, some results have not been displayed. A complete set of results can be found in Results Review.       Radiology:  Radiology past 48 hours:  [place] Chest    07/31/2013   IMPRESSION:   Heterogeneous region of somewhat thickened muscle, correlating with  patient's area of concern. This finding is nonspecific and may  represent inflammation. There is no evidence of focal fluid  collection. If there is continued concern, consider chest CT for  further assessment.   END OF REPORT      Assessment/Plan:  Patient is a 58 y.o. female with a h/o MI, CVA, HTN, and prior admissions for AKI with hypotension who presented with dizziness and hypotension detected in PCP office admitted for AKI.     Renal/GU: AKI 2/2 UTI,  dehydration/diarrhea and medications  - Cr 1.9<--2.0, will repeat BMP in 8 hrs   - Will treat UTI empirically with IV ceftriaxone for UTI, cultures pending   - Follow strict I&O  - holding home morphine, meloxicam, metformin    CV: hypotension likely 2/2 urosepsis, DM2, history of MI and CVA  - VSS now after ~3L NS  - Q4hr VS  - Lantus was started at 40units, decreased from 70 units - will increase to 50 units today for elevated BGs  - cont SSI  - hold home metoprolol (pt had d/c'd), amlodipine, losartan-HCTZ; continue home ASA 325mg  , plavix; restart lipitor    Resp: asthma vs COPD  - cont home duoneb QID with prn albuterol  - hold home tiotropium, prednisone, tessalon    FEN/GI: diarrhea, GERD  - check stool lactoferrin, cx and Cdiff  - normal WBC  - hold home miralax, colace; cont protonix for prilosec    Neuro/Psych/pain: mental health issues, chronic pain?   - restart zoloft, gabapentin, tylenol; cont buproprion, buspar, trazodone  - holding home meloxicam, morphine, hydroxizine    Derm: right axillary mass  - US showed heterogeneous thickened muscle, nonspecific inflammation, no focal fluid, recommends chest CT  - given AKI, will first check 12pm Cr, then decide on  CT with contrast    DVT prophylaxis:Unfractionated Heparin TID   Fluids:0.9NS  Electrolytes: BMPNutrition: general  Code Status:Full code  Dispo: pending clinical stability    D/w Dr. Elisabeth Pigeon.    Author: Kai Levins

## 2013-08-01 NOTE — Progress Notes (Signed)
Pt was found to have unmarked and unidentified pills in her room. They were taken away and brought to Pharmacy identified them as Trazadone, 2 tablets of 150 mg. Medications currently in patient drawer. When writer asked pt what type of pills they were she stated "I don't even know" Letta Moynahan notified via text page. Nursing will continue to monitor.  Feliz Beam, RN  08/01/2013  12:20 PM

## 2013-08-01 NOTE — Interdisciplinary Rounds (Signed)
Interdisciplinary Rounds Note    Date: 08/01/2013   Time: 11:55 AM   Attendance:  Care Coordinator, Registered Nurse and Social Worker    Admit Date/Time:  07/31/2013  3:37 PM    Principal Problem: <principal problem not specified>  Problem List:   Patient Active Problem List    Diagnosis Date Noted    UTI (lower urinary tract infection) 09/16/2012     Priority: High     Noted in ED 08/2012, not treated.  Needs test of remission/cure.      Chest heaviness 07/29/2012     Priority: High    Hypertension 03/19/2007     Priority: High     Diabetes Health Maintenence Review       []  Patient correctly reports ABC's of Diabetes    HbA1c:           []  AT goal       [x]  NOT at goal     Blood pressure:       [x]  AT goal                []  NOT at goal   LDL:                          []  AT goal   [x]  NOT at goal     Microalbumin:          []  Normal     [x]  + Proteinuria                       []  Unknown   ACEi/ARB Rx:           [x]  Yes          []  No   []  N/A      Immunizations:        Influenza              []  Up to date            [x]  Needed  []  Refused        Pneumovax          [x]  Up to date            []  Needed  []  Refused        HepB                    []  Up to date            [x]  Needed  []  Refused     Foot Exam:               [x]  Up to date            []  Needed  []  Refused   Eye  Exam:               []  Up to date            [x]  Needed  []  Refused       Requested Referrals:          []  None        []  Diabetes Education        []  Ophthalmology     []  Podiatry             Type II, uncontrolled 03/19/2007     Priority: High     Ophthalmologist: [place]  Podiatrist:  [facility] [phone]    Diabetes Health Maintenence Review       []  Patient correctly reports ABC's of Diabetes    HbA1c:           []  AT goal       [x]  NOT at goal     Blood pressure:       [x]  AT goal                []  NOT at goal   LDL:                          []  AT goal   [x]  NOT at goal     Microalbumin:          []  Normal     [x]  +  Proteinuria                       []  Unknown   ACEi/ARB Rx:           [x]  Yes          []  No   []  N/A        Immunizations:        Influenza              []  Up to date            [x]  Needed  []  Refused        Pneumovax          [x]  Up to date            []  Needed  []  Refused        HepB                    []  Up to date            [x]  Needed  []  Refused       Foot Exam:               [x]  Up to date            []  Needed  []  Refused   Eye  Exam:               []  Up to date            [x]  Needed  []  Refused       Requested Referrals:          [x]  None        []  Diabetes Education        []  Ophthalmology     []  Podiatry               Vocal cord disease 08/05/2012     Priority: Medium     Noted as component of SOB during hospitalization for COPD exacerbation 08/2012.      Controlled substance agreement signed 12/08/2011     Priority: Medium     For DJD associated bilateral knee pain.  Therapeutic goal of independent ADL, increased activity and weight loss.  Copy of controlled substance agreement in Media tab for printing.  -      12/01/2011 Morphine 12h tabs 30mg  BID PRN.  60 tabs,  Renew on 01/01/2012.  -    12/28/2011 Morphine 12h tabs 60mg  BID PRN.  40 tabs,  Renew on 01/08/2012.   Rx: morphine (MS CONTIN) 30 MG 12 hr tablet; Take 2 tablets (60 mg  total) by mouth 2 times daily as needed for Severe Pain   MDD 120 mg.  10 day supply.      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  Hx symptoms concerning for long QT on doses >300mg  (self admin, non-adherent to prescribed dosage).      Anxiety and depression 11/10/2010     Priority: Medium     PHQ2  PHQ9 07/31/2013   PHQ-9 Total Score 1       PHQ9  PHQ9 05/08/2012   PHQ-9 Total Score 7       PHQ9 11/17/2011   PHQ-9 Total Score 14           COPD (chronic obstructive pulmonary disease) 12/30/2008      Priority: Medium     COPD exacerbations for year: 03/2011; 12/2011, 03/2012, 07/2012  No recent PFTs, likely GOLD 2 or 3.    mMRC likely 3, CAT likely >21.    Former smoker, in remission.  No O2 requirement.  Current Meds:    - budesonide-formoterol (Symbicort) 160-4.5 BID    - albuterol PRN     - Combivent PRN and prednisone 40mg  daily for 5 days for exacerbation, call MD if using        Coronary Artery Disease 03/19/2007     Priority: Medium     Stents placed; on aspirin + plavix    December 2010 Stress Echo  Overall impression:   1. Partially transient inferior wall defect consistent with limited mixed ischemia/infarction in the RCA distribution   2. Preserved left ventricular systolic function by gated SPECT.         Pain, dental 05/30/2011     Priority: Low    Bilateral presbyopia 05/30/2011     Priority: Low    Dental caries 11/10/2010     Priority: Low    Asthma 03/19/2007     Priority: Low     Created by Conversion        Hypotension 07/31/2013    Vaginal bleeding 06/11/2013    Dehydration 12/13/2012       The patient's problem list and interdisciplinary care plan was reviewed.    Discharge Planning                 *Does patient currently have home care services?: Yes   If yes, which agency?: [facility]  *Current External Services: None                      Plan    Anticipated Discharge Date: TBD     Discharge Disposition: Home with Services [facility]

## 2013-08-01 NOTE — ED Notes (Signed)
Bed confirmed to [place] 7 spoke to [place]. Please call Aram Beecham at [phone] with any questions.

## 2013-08-01 NOTE — Comprehensive Assessment (Signed)
07/31/13 0000   Discharge Planning   Lives With Significant other   Can they assist with pt needs after discharge? Yes   *Does patient currently have home care services? Yes   If yes, which agency? [facility]   *Current External Services None   Current Home Equipment Walker;Cane;Wheelchair-electric;Shower chair;Commode;Grab bars   SW Plan SW to follow (see discharge plan)   Interventions   Interventions Homecare service referral initiated   Encarnacion Chu, LMSW 08/01/2013 9:15 AM

## 2013-08-01 NOTE — Discharge Instructions (Addendum)
Brief Summary of Your Hospital Course (including key procedures and diagnostic test results):  You came in with low blood pressure, dizziness. You were found to have a urine infection and were started on antibiotics. Your blood pressures improved with fluids through your IV. You were well enough to go home.    Your instructions:  Take all medications as prescribed.  Keep your appointment with your regular doctor - Dr. Tacey Ruiz on 08/07/2013 at 11:20 AM    Patient may return to congregate care, Level 3, EHP#3  Patient is able to self-administer medications. She may self- administer insulin and manage BG equipment, nebulizer machine as instructed.  Readmit to home care agency.    What to do after you leave the hospital:    Recommended diet: carbohydrate controlled diet    Recommended activity: activity as tolerated, patient can walk 250 feet and self manage her electric scooter    If you experience any of these symptoms within the first 24 hours after discharge:Uncontrolled pain, Chest pain, Fever of 101 F. or greater, Chills, Poor urinary output, Vomiting, Blood in stool, Loss of consciousness, New or worsening headache or Weakness  please follow up with the discharge attending Garner Nash, DO at phone-number: [phone]    If you experience any of these symptoms 24 hours or more after discharge:Uncontrolled pain, Chest pain, Fever of 101 F. or greater, Chills, Poor urinary output, Vomiting, Blood in stool, Loss of consciousness, New or worsening headache or Weakness  please follow up with your PCP:  Mardene Sayer, MD [phone]            Discharge Instructions for Patients receiving   Contrast Medium    08/01/2013  6:01 PM    INFORMATION  I.V. contrast is eliminated through the kidneys.  Oral and rectal contrasts are not absorbed by the body and will be eliminated through the gastrointestinal tract.  Side effects and allergic reactions to contrast mediums are not common but can occur up to 48 hours after your  scan.    INSTRUCTIONS   1. You will need to drink four 8 oz glasses of fluid over the next four hours, unless your medical condition does not allow you to do this.  Please speak with an [place] if you have any questions or concerns about this.  2. Mild headache may occur following contrast injection.  3. The kidneys excrete the contrast.  Your urine will NOT become discolored.  4. You may experience loose stools/diarrhea for approximately 24 hours after drinking oral contrast.  5. You may experience watery stool immediately after rectal contrast.  6.  If you are a DIABETIC and take Actos Plus Met, Actos Plus Met XR, Avandamet, Foramet, Glucophage, Glucophage XR, Glucovance, Glumetza, Janumet, Janumet XR, Jentadueto, Kazano, Kombiglyze XR, Metaglip, PrandiMet, Riomet or Metformin, hold this medication for 48 hours (2 days) after your exam.  Check with your doctor for management of your diabetes during this time and before restarting these medications.  Your doctor will have to address the need for kidney function blood tests before restarting your diabetes medication.    7.  There is no evidence that the contrast you have received would be harmful to your breastfed child.  If you choose, you may pump and discard your breast milk for the next 24 hours.    Delayed effects from contrast are not common.  However, notify your doctor IMMEDIATELY:    If nausea or vomiting occurs   If any itching, hives,  wheezing, or rashes occur   Severe or throbbing headache    Monday - Friday 8am-5pm: For questions or concerns regarding your procedure please call [facility] ([facility] [phone].    After hours/Weekends:  [facility] patients may call [phone] and ask to speak with the Radiology resident on call.   [facility] patients may call [phone] to speak with the [place] or call [phone] and ask to speak with the radiology  resident on call.  The Emergency Department is open 24 hours a day at [place] [facility] if emergency treatment is required.

## 2013-08-01 NOTE — ED Provider Progress Notes (Signed)
ED Provider Progress Note    Received patient in sign-out from Dr. Quin Hoop.  Ultrasound without abscess.  She states that she continues to feel lightheaded and expresses concern about discharge home.  Plan for observation, IVF, monitoring.       Si Raider, MD, 08/01/2013, 12:02 AM

## 2013-08-01 NOTE — Comprehensive Assessment (Signed)
08/01/13 0904   Demographics   Religious/Cultural Factors Other (comment)  [name])   [name]   Marital Status Not married  (Has a long time significant other)   Ethnicity/Race African American   Primary Care Taker? No   Living Status with family   Risk Factors   Risk Factors Age related issues;Adjustment to Dx/Injury/Illness;Caregiver limitations   Health Care Directives Screen (Also required for Hospice Patients)   *Has patient (or family) completed any of the following? (select all that apply) None completed   *Would they like to discuss any issues related to MOLST, DNR Order, HCP, Living Will, or POA? No   *Health Care Directive teaching done No   Contact Information   Spokesperson Name Gloria Ray   Relationship to Patient  Daughter   Phone Number(s) [phone]   Alternate Spokesperson? Yes   Spokesperson Alternate Gloria Ray   Relationship to Patient  Other (Comment)  (Significant other)   Phone Number(s) [phone]   Emergency Contact other than spokesperson? No   Ride to/from [place] With Significant other   Home Geography   Type of Home Apartment   # Of Steps In Home 0   # Steps to Enter Home 0   One Story or Two One story   Bedroom First floor   Utilitites Working Yes   Psychosocial   Coping Status Well   Current Goal of Care Curative   Activities of Daily Living   Transfers Independent   Assistive Device [name];Wheelchair  Nurse, mental health)   Ambulation Independent  (with device)   Bathing/Grooming Independent   Nutrition With assistance  (so assists)   Household Management [name] Unemployed   Insurance Information Medicaid   Prescription Coverage and has   [name] out pt [place] not a [place] met with pt at bedside who states that she lives at home with her long time significant other.  He assists with meal preparation.  She is  independent with ADL's but uses a cane/walker for ambulation and an electric wheel chair for longer distances.  At this time they have friends who drive them to and from the store to run errands.  She has telehealth through [name] and would like to be referred back to them.  SW left a message for [name] of [name] to notify her of the referral  Antonieta Iba, LMSW 08/01/2013 9:14 AM

## 2013-08-01 NOTE — Telephone Encounter (Signed)
Admitted and seen by me this am.    Refer to inpatient notes for details.    Gloria Sarracino J Dalal Livengood, DO

## 2013-08-01 NOTE — Plan of Care (Signed)
Problem: Safety  Goal: Patient will remain free of falls  Outcome: Progressing towards goal    Problem: Pain/Comfort  Goal: Patients pain or discomfort is manageable  Outcome: Progressing towards goal    Problem: Nutrition  Goal: Patients nutritional status is maintained or improved  Outcome: Progressing towards goal  Intervention: Provide education for Pt/family for special diet order  Pt educated on diabetic diet.Marland KitchenMarland KitchenNeeds reinforcement          Problem: Mobility  Goal: Patients functional status is maintained or improved  Outcome: Progressing towards goal  Intervention: Encourage independent activity per patients ability  Pt ambulates with cane          Problem: Psychosocial  Goal: Demonstrates ability to cope with illness  Outcome: Progressing towards goal

## 2013-08-02 LAB — CBC AND DIFFERENTIAL
Baso # K/uL: 0 10*3/uL (ref 0.0–0.1)
Basophil %: 0.5 % (ref 0.1–1.2)
Eos # K/uL: 0.1 10*3/uL (ref 0.0–0.4)
Eosinophil %: 1.6 % (ref 0.7–5.8)
Hematocrit: 32 % — ABNORMAL LOW (ref 34–45)
Hemoglobin: 9.9 g/dL — ABNORMAL LOW (ref 11.2–15.7)
Lymph # K/uL: 1.5 10*3/uL (ref 1.2–3.7)
Lymphocyte %: 39.6 % (ref 19.3–51.7)
MCH: 25 pg — ABNORMAL LOW (ref 26–32)
MCHC: 31 g/dL — ABNORMAL LOW (ref 32–36)
MCV: 82 fL (ref 79–95)
Mono # K/uL: 0.3 10*3/uL (ref 0.2–0.9)
Monocyte %: 7.8 % (ref 4.7–12.5)
Neut # K/uL: 1.9 10*3/uL (ref 1.6–6.1)
Platelets: 200 10*3/uL (ref 160–370)
RBC: 3.9 MIL/uL (ref 3.9–5.2)
RDW: 15.6 % — ABNORMAL HIGH (ref 11.7–14.4)
Seg Neut %: 50.2 % (ref 34.0–71.1)
WBC: 3.8 10*3/uL — ABNORMAL LOW (ref 4.0–10.0)

## 2013-08-02 LAB — POCT GLUCOSE
Glucose POCT: 302 mg/dL — ABNORMAL HIGH (ref 60–99)
Glucose POCT: 314 mg/dL — ABNORMAL HIGH (ref 60–99)
Glucose POCT: 345 mg/dL — ABNORMAL HIGH (ref 60–99)
Glucose POCT: 322 mg/dL — ABNORMAL HIGH (ref 60–99)
Glucose POCT: 277 mg/dL — ABNORMAL HIGH (ref 60–99)

## 2013-08-02 LAB — BASIC METABOLIC PANEL
Anion Gap: 14 (ref 7–16)
CO2: 21 mmol/L (ref 20–28)
Calcium: 8.7 mg/dL (ref 8.6–10.2)
Chloride: 103 mmol/L (ref 96–108)
Creatinine: 0.98 mg/dL — ABNORMAL HIGH (ref 0.51–0.95)
GFR,Black: 74 *
GFR,Caucasian: 64 *
Glucose: 322 mg/dL — ABNORMAL HIGH (ref 60–99)
Lab: 15 mg/dL (ref 6–20)
Potassium: 4.7 mmol/L (ref 3.3–5.1)
Sodium: 138 mmol/L (ref 133–145)

## 2013-08-02 LAB — AEROBIC CULTURE

## 2013-08-02 MED ORDER — TIOTROPIUM BROMIDE MONOHYDRATE 18 MCG IN CAPS *I*
18.0000 ug | ORAL_CAPSULE | Freq: Every day | RESPIRATORY_TRACT | Status: DC
Start: 2013-08-02 — End: 2013-08-04
  Administered 2013-08-02 – 2013-08-04 (×3): 18 ug via RESPIRATORY_TRACT
  Filled 2013-08-02 (×4): qty 1

## 2013-08-02 MED ORDER — CEFPODOXIME PROXETIL 200 MG PO TABS *I*
100.0000 mg | ORAL_TABLET | Freq: Two times a day (BID) | ORAL | Status: DC
Start: 2013-08-02 — End: 2013-08-04
  Administered 2013-08-02 – 2013-08-04 (×5): 100 mg via ORAL
  Filled 2013-08-02 (×5): qty 1

## 2013-08-02 MED ORDER — INSULIN GLARGINE 100 UNIT/ML SC SOLN *WRAPPED*
70.0000 [IU] | Freq: Every evening | SUBCUTANEOUS | Status: DC
Start: 2013-08-02 — End: 2013-08-03
  Administered 2013-08-02: 70 [IU] via SUBCUTANEOUS

## 2013-08-02 NOTE — Progress Notes (Signed)
Family Medicine Progress Note    Subjective:     Interval History: Unmarked pills found on patient yesterday turned out to be trazadone after investigation by pharm. CT chest determined chest mass to be lipoma. Afebrile overnight. Patient reports she is over all feeling less fatigued than yesterday.  No further hypotension noted.  She was found to also have nitrates and bacteria in urine and was started on abx yesterday.  She is still having some SOB; wants to have Spiriva which she reports to helping with her breathing in the past.    Objective:    Vital signs in last 24 hours:    Temp:  [35.7 C (96.3 F)-37.1 C (98.8 F)] 35.8 C (96.4 F)  Heart Rate:  [71-90] 83  Resp:  [18-20] 18  BP: (108-125)/(52-80) 124/80 mmHg    Exam:    Gen: sleeping peacefully, in NAD  HEENT: NCAT.   Pulmonary: decreased breath sounds at the bases, mild end-expiratory wheezing. Breathing non-labored.  No rales or rhonchi appreciated  Heart: Normal S1 & S2. RRR. No R/M/G.  Abdomen: obese, soft, non-tender, non-distended. No masses or HSM. BS present.  Extremities: 1+ pitting edema. 2+ pedal pulses symmetric bilaterally. Calves non-tender.  Neuro: Cranial nerves II-XII grossly intact.  Mental Status: Cooperative.    Labs:  Recent Results (from the past 24 hour(s))   POCT GLUCOSE    Collection Time     08/01/13  8:27 AM       Result Value Range    Glucose POCT 286 (*) 60 - 99 mg/dL   BASIC METABOLIC PANEL    Collection Time     08/01/13 12:08 PM       Result Value Range    Glucose 332 (*) 60 - 99 mg/dL    Sodium 122  482 - 500 mmol/L    Potassium 4.7  3.3 - 5.1 mmol/L    Chloride 102  96 - 108 mmol/L    CO2 21  20 - 28 mmol/L    Anion Gap 13  7 - 16    UN 25 (*) 6 - 20 mg/dL    Creatinine 3.70 (*) 0.51 - 0.95 mg/dL    GFR,Caucasian 42 (*)     GFR,Black 48 (*)     Calcium 8.7  8.6 - 10.2 mg/dL   POCT GLUCOSE    Collection Time     08/01/13 12:26 PM       Result Value Range    Glucose POCT 315 (*) 60 - 99 mg/dL   POCT GLUCOSE    Collection  Time     08/01/13  5:12 PM       Result Value Range    Glucose POCT 356 (*) 60 - 99 mg/dL   POCT GLUCOSE    Collection Time     08/01/13  8:44 PM       Result Value Range    Glucose POCT 247 (*) 60 - 99 mg/dL   POCT GLUCOSE    Collection Time     08/02/13  2:24 AM       Result Value Range    Glucose POCT 302 (*) 60 - 99 mg/dL       Radiology:    [place] Chest 07/31/2013   IMPRESSION:   Heterogeneous region of somewhat thickened muscle, correlating with  patient's area of concern. This finding is nonspecific and may  represent inflammation. There is no evidence of focal fluid  collection. If there is continued concern,  consider chest CT for  further assessment.   END OF REPORT    08/01/13 CT Chest 1. There is a 4.1 x 3.6 cm focus of fat causing mass effect on the right latissimus dorsi muscle lateral to the right scapula. This may represent the possible abnormality which was seen on ultrasound and   is most consistent with a lipoma.  2. 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      Assessment/Plan:  Patient is a 58 y.o. female with a h/o MI, CVA, HTN, and prior admissions for AKI with hypotension who presented with dizziness and hypotension detected in PCP office admitted for AKI.  AKI has resolved; also had finding of cystitis that is on day #2 of treatment.    Renal/GU: AKI 2/2 UTI, dehydration/diarrhea and medications  Awaiting am labs.     - Will treat UTI empirically with IV ceftriaxone for UTI, cultures pending ; can switch to PO once cultures return  -AKI has resolved; recheck BMP on 5/31  - Follow strict I&O  - holding home morphine, meloxicam, metformin    CV: hypotension likely 2/2 urosepsis, DM2, history of MI and CVA    - VSS now after ~3L NS  - Q4hr VS  - Lantus was started at 40units, decreased from 70 units - will increase to 50 units today for elevated BGs  - cont SSI  - hold home metoprolol (pt had d/c'd), amlodipine, losartan-HCTZ;  continue home ASA 325mg  , plavix; restart lipitor    Resp: asthma vs COPD    - cont home duoneb QID with prn albuterol  - hold home tiotropium, prednisone, tessalon    FEN/GI: diarrhea, GERD    Loose stools have resolved;  - continue to hold home miralax, colace; cont protonix for prilosec    Neuro/Psych/pain: mental health issues, chronic pain?   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 Elisabeth Pigeonevine.    Author: Reeves Damachel Long, DO  Note created: 08/02/2013  at: 7:22 AM     I saw and evaluated the patient. I agree with the resident's findings and plan of care as documented above.    Updates added in italics    Basheer Molchan J Earnstine Meinders, DO

## 2013-08-03 LAB — BASIC METABOLIC PANEL
Anion Gap: 13 (ref 7–16)
CO2: 23 mmol/L (ref 20–28)
Calcium: 9.5 mg/dL (ref 8.6–10.2)
Chloride: 103 mmol/L (ref 96–108)
Creatinine: 0.91 mg/dL (ref 0.51–0.95)
GFR,Black: 81 *
GFR,Caucasian: 70 *
Glucose: 328 mg/dL — ABNORMAL HIGH (ref 60–99)
Lab: 10 mg/dL (ref 6–20)
Potassium: 4.1 mmol/L (ref 3.3–5.1)
Sodium: 139 mmol/L (ref 133–145)

## 2013-08-03 LAB — CBC AND DIFFERENTIAL
Baso # K/uL: 0 10*3/uL (ref 0.0–0.1)
Basophil %: 0.2 % (ref 0.1–1.2)
Eos # K/uL: 0.1 10*3/uL (ref 0.0–0.4)
Eosinophil %: 2.1 % (ref 0.7–5.8)
Hematocrit: 33 % — ABNORMAL LOW (ref 34–45)
Hemoglobin: 10.1 g/dL — ABNORMAL LOW (ref 11.2–15.7)
Lymph # K/uL: 1.7 10*3/uL (ref 1.2–3.7)
Lymphocyte %: 38.6 % (ref 19.3–51.7)
MCH: 25 pg — ABNORMAL LOW (ref 26–32)
MCHC: 31 g/dL — ABNORMAL LOW (ref 32–36)
MCV: 82 fL (ref 79–95)
Mono # K/uL: 0.3 10*3/uL (ref 0.2–0.9)
Monocyte %: 7.4 % (ref 4.7–12.5)
Neut # K/uL: 2.2 10*3/uL (ref 1.6–6.1)
Platelets: 208 10*3/uL (ref 160–370)
RBC: 4 MIL/uL (ref 3.9–5.2)
RDW: 15.6 % — ABNORMAL HIGH (ref 11.7–14.4)
Seg Neut %: 51.2 % (ref 34.0–71.1)
WBC: 4.4 10*3/uL (ref 4.0–10.0)

## 2013-08-03 LAB — POCT GLUCOSE
Glucose POCT: 262 mg/dL — ABNORMAL HIGH (ref 60–99)
Glucose POCT: 314 mg/dL — ABNORMAL HIGH (ref 60–99)
Glucose POCT: 260 mg/dL — ABNORMAL HIGH (ref 60–99)
Glucose POCT: 285 mg/dL — ABNORMAL HIGH (ref 60–99)
Glucose POCT: 180 mg/dL — ABNORMAL HIGH (ref 60–99)

## 2013-08-03 MED ORDER — INSULIN GLARGINE 100 UNIT/ML SC SOLN *WRAPPED*
80.0000 [IU] | Freq: Every evening | SUBCUTANEOUS | Status: DC
Start: 2013-08-03 — End: 2013-08-04
  Administered 2013-08-03: 80 [IU] via SUBCUTANEOUS

## 2013-08-03 MED ORDER — HYDROCHLOROTHIAZIDE 25 MG PO TABS *I*
25.0000 mg | ORAL_TABLET | Freq: Every day | ORAL | Status: DC
Start: 2013-08-03 — End: 2013-08-04
  Administered 2013-08-03 – 2013-08-04 (×2): 25 mg via ORAL
  Filled 2013-08-03 (×2): qty 1

## 2013-08-03 MED ORDER — METOPROLOL SUCCINATE 50 MG PO TB24 *I*
100.0000 mg | ORAL_TABLET | Freq: Every day | ORAL | Status: DC
Start: 2013-08-03 — End: 2013-08-04
  Administered 2013-08-03 – 2013-08-04 (×2): 100 mg via ORAL
  Filled 2013-08-03 (×2): qty 2

## 2013-08-03 MED ORDER — LOSARTAN POTASSIUM 25 MG PO TABS *I*
100.0000 mg | ORAL_TABLET | Freq: Every day | ORAL | Status: DC
Start: 2013-08-03 — End: 2013-08-04
  Administered 2013-08-03 – 2013-08-04 (×2): 100 mg via ORAL
  Filled 2013-08-03 (×2): qty 4

## 2013-08-03 NOTE — Progress Notes (Addendum)
Family Medicine Progress Note    Subjective:     Interval History: No events overnight.    Today, pt reports feeling weak all over. No other concerns. Denies CP, SOB, HA, N/V, abdominal pain, GI/GU concerns.    Objective:    Vital signs in last 24 hours:    Temp:  [36.1 C (97 F)-36.8 C (98.2 F)] 36.2 C (97.2 F)  Heart Rate:  [86-95] 90  Resp:  [18] 18  BP: (134-152)/(72-88) 150/86 mmHg    Exam:  Gen: well-appearing, in NAD  HEENT: NCAT.   Pulmonary: CTA. Breathing non-labored.  Heart: Normal S1 & S2. RRR. No R/M/G.  Abdomen: obese, soft, non-tender, non-distended. No masses or HSM. BS present.  Extremities: 1+ pitting edema. 2+ pedal pulses symmetric bilaterally. Calves non-tender.  Neuro: Cranial nerves II-XII grossly intact.  Mental Status: Cooperative.    Labs:  Recent Results (from the past 24 hour(s))   POCT GLUCOSE    Collection Time     08/02/13  5:31 PM       Result Value Range    Glucose POCT 322 (*) 60 - 99 mg/dL   POCT GLUCOSE    Collection Time     08/02/13  9:38 PM       Result Value Range    Glucose POCT 277 (*) 60 - 99 mg/dL   POCT GLUCOSE    Collection Time     08/03/13  2:37 AM       Result Value Range    Glucose POCT 262 (*) 60 - 99 mg/dL   BASIC METABOLIC PANEL    Collection Time     08/03/13  6:12 AM       Result Value Range    Glucose 328 (*) 60 - 99 mg/dL    Sodium 540139  981133 - 191145 mmol/L    Potassium 4.1  3.3 - 5.1 mmol/L    Chloride 103  96 - 108 mmol/L    CO2 23  20 - 28 mmol/L    Anion Gap 13  7 - 16    UN 10  6 - 20 mg/dL    Creatinine 4.780.91  2.950.51 - 0.95 mg/dL    GFR,Caucasian 70      GFR,Black 81      Calcium 9.5  8.6 - 10.2 mg/dL   CBC AND DIFFERENTIAL    Collection Time     08/03/13  6:12 AM       Result Value Range    WBC 4.4  4.0 - 10.0 THOU/uL    RBC 4.0  3.9 - 5.2 MIL/uL    Hemoglobin 10.1 (*) 11.2 - 15.7 g/dL    Hematocrit 33 (*) 34 - 45 %    MCV 82  79 - 95 fL    MCH 25 (*) 26 - 32 pg    MCHC 31 (*) 32 - 36 g/dL    RDW 62.115.6 (*) 30.811.7 - 14.4 %    Platelets 208  160 - 370 THOU/uL     Seg Neut % 51.2  34.0 - 71.1 %    Lymphocyte % 38.6  19.3 - 51.7 %    Monocyte % 7.4  4.7 - 12.5 %    Eosinophil % 2.1  0.7 - 5.8 %    Basophil % 0.2  0.1 - 1.2 %    Neut # K/uL 2.2  1.6 - 6.1 THOU/uL    Lymph # K/uL 1.7  1.2 - 3.7 THOU/uL  Mono # K/uL 0.3  0.2 - 0.9 THOU/uL    Eos # K/uL 0.1  0.0 - 0.4 THOU/uL    Baso # K/uL 0.0  0.0 - 0.1 THOU/uL   POCT GLUCOSE    Collection Time     08/03/13  9:10 AM       Result Value Range    Glucose POCT 314 (*) 60 - 99 mg/dL   POCT GLUCOSE    Collection Time     08/03/13 12:12 PM       Result Value Range    Glucose POCT 260 (*) 60 - 99 mg/dL       Radiology:    [place] Chest 07/31/2013   IMPRESSION:   Heterogeneous region of somewhat thickened muscle, correlating with  patient's area of concern. This finding is nonspecific and may  represent inflammation. There is no evidence of focal fluid  collection. If there is continued concern, consider chest CT for  further assessment.   END OF REPORT    08/01/13 CT Chest 1. There is a 4.1 x 3.6 cm focus of fat causing mass effect on the right latissimus dorsi muscle lateral to the right scapula. This may represent the possible abnormality which was seen on ultrasound and   is most consistent with a lipoma.  2. 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      Assessment/Plan:  Patient is a 58 y.o. female with a h/o MI, CVA, HTN, and prior admissions for AKI with hypotension who presented with dizziness and hypotension detected in PCP office admitted for AKI.  AKI has resolved; also had finding of cystitis that is on day #3 of treatment.    Renal/GU: AKI 2/2 UTI, dehydration/diarrhea and medications  - Improved Cr 0.91.  - Urine cx shows +E.coli; switch from CTX to PO regimen  - Follow strict I&O  - holding home morphine, meloxicam, metformin    CV: hypotension likely 2/2 urosepsis, DM2, history of MI and CVA  - BPs in the 150s today - resume metoprolol,  losartan-HCTZ; hold amlodipine  - BG's still in the upper 200s/low 300s despite home regiment of Lantus 70 units- will increase to 80 units today for elevated BGs  - cont SSI - received 42 units in last 24 hours  - continue home ASA 325mg  , plavix, lipitor    Resp: asthma vs COPD  - cont home duoneb QID with prn albuterol  - hold home tiotropium, prednisone, tessalon    GI: loose stools have resolved  - Restart home miralax, colace; cont protonix for prilosec    Neuro/Psych/pain: mental health issues, chronic pain?   - restart zoloft, gabapentin, tylenol; cont buproprion, buspar, trazodone  - holding home meloxicam, morphine, hydroxizine    Derm: right axillary mass  -lipoma noted from CT; no further tx needed at this time; pt informed of results and she will let us know if it becomes larger or becomes painful    DVT prophylaxis:Unfractionated Heparin TID   Fluids: PO  Electrolytes: BMP   Nutrition: general  Code Status:Full code  Dispo: likely tomorrow    D/w Dr. Elisabeth Pigeon.    Author: Letta Moynahan, MD  Note created: 08/03/2013  at: 12:22 PM       I saw and evaluated the patient. I agree with the resident's findings and plan of care as documented above.    Anticipate discharge on 6/1; cont current  plan as above.    Florance Paolillo J Jerimie Mancuso, DO

## 2013-08-03 NOTE — Progress Notes (Signed)
Pt c/o constipation, states "I haven't had a bowel movement in days!" Writer provided pt with warmed prune juice, per pt request. Will continue to monitor.     Renne Crigler, RN

## 2013-08-04 ENCOUNTER — Ambulatory Visit
Admit: 2013-08-04 | Discharge: 2013-09-02 | Disposition: A | Discharge status: Home / Self Care | Payer: Self-pay | Source: Ambulatory Visit | Attending: Psychiatry | Admitting: Psychiatry

## 2013-08-04 ENCOUNTER — Telehealth: Payer: Self-pay

## 2013-08-04 DIAGNOSIS — I959 Hypotension, unspecified: Principal | ICD-10-CM | POA: Diagnosis present

## 2013-08-04 DIAGNOSIS — N390 Urinary tract infection, site not specified: Secondary | ICD-10-CM | POA: Diagnosis present

## 2013-08-04 LAB — BASIC METABOLIC PANEL
Anion Gap: 14 (ref 7–16)
CO2: 25 mmol/L (ref 20–28)
Calcium: 9.7 mg/dL (ref 8.6–10.2)
Chloride: 101 mmol/L (ref 96–108)
Creatinine: 0.96 mg/dL — ABNORMAL HIGH (ref 0.51–0.95)
GFR,Black: 76 *
GFR,Caucasian: 66 *
Glucose: 200 mg/dL — ABNORMAL HIGH (ref 60–99)
Lab: 11 mg/dL (ref 6–20)
Potassium: 4.1 mmol/L (ref 3.3–5.1)
Sodium: 140 mmol/L (ref 133–145)

## 2013-08-04 LAB — POCT GLUCOSE
Glucose POCT: 197 mg/dL — ABNORMAL HIGH (ref 60–99)
Glucose POCT: 242 mg/dL — ABNORMAL HIGH (ref 60–99)

## 2013-08-04 MED ORDER — IPRATROPIUM-ALBUTEROL 0.5-2.5 MG/3ML IN SOLN *I*
3.0000 mL | Freq: Four times a day (QID) | RESPIRATORY_TRACT | Status: AC
Start: 2013-08-04 — End: ?

## 2013-08-04 MED ORDER — CEFPODOXIME PROXETIL 100 MG PO TABS *I*
100.0000 mg | ORAL_TABLET | Freq: Two times a day (BID) | ORAL | Status: AC
Start: 2013-08-04 — End: 2013-08-08

## 2013-08-04 MED ORDER — CEFDINIR 300 MG PO CAPS *I*
600.0000 mg | ORAL_CAPSULE | Freq: Every day | ORAL | Status: DC
Start: 2013-08-04 — End: 2013-08-04

## 2013-08-04 MED ORDER — CEFPODOXIME PROXETIL 100 MG PO TABS *I*
100.0000 mg | ORAL_TABLET | Freq: Two times a day (BID) | ORAL | Status: DC
Start: 2013-08-04 — End: 2013-08-04

## 2013-08-04 MED ORDER — POLYETHYLENE GLYCOL 3350 PO PACK 17 GM *I*
17.0000 g | PACK | Freq: Every day | ORAL | Status: AC
Start: 2013-08-04 — End: ?

## 2013-08-04 NOTE — Progress Notes (Signed)
Reviewed discharge instructions with patient; pt verbalized understanding of these instructions. Scripts faxed down to the outpatient pharmacy, as pt will be taking a cab home. IV pulled, site clean and intact. Pt currently awaiting transportation.       Renne Crigler, RN

## 2013-08-04 NOTE — Discharge Summary (Signed)
 Name: Gloria Ray MRN: [id number] DOB: 06-29-1955     Admit Date: 08/01/2013   Date of Discharge: 08/04/2013    Discharge Attending Physician: Lyna Poser      Hospitalization Summary    CONCISE NARRATIVE:   Patient is a 58 y.o. female with a h/o MI, CVA, HTN, and prior admissions for AKI with hypotension who presented with dizziness, diarrhea and hypotension detected in PCP office admitted for AKI, hypotension. Found to have E coli UTI - started on CTX and transitioned to cefpodoxime PO - Rx'd to complete 7 day course. AKI and hypotension resolved with hydration. Restarted on home diabetes and antihypertensive regimen. During hospital stay, workup for right axillary mass revealed lipoma - to be further managed as outpatient. Has f/u appt with PCP within 1 week.       Medication List      START taking these medications         cefpodoxime 100 MG tablet Commonly known as:  VANTIN Take 1 tablet (100   mg total) by mouth 2 times daily for 4 days       CHANGE how you take these medications         busPIRone 30 MG tablet Commonly known as:  BUSPAR TAKE 1 TABLET BY MOUTH   TWICE DAILY What changed:  See the new instructions.     gabapentin 300 MG capsule Commonly known as:  NEURONTIN Take 3 capsules   (900 mg total) by mouth nightly What changed:  how much to take     insulin glargine 100 UNIT/ML injection pen Commonly known as:  LANTUS   SOLOSTAR Inject 90 Units into the skin nightly   Increase b What changed:      - how much to take  - additional instructions     omeprazole 40 MG capsule Commonly known as:  PriLOSEC TAKE 1 CAPSULE BY   MOUTH EVERY DAY What changed:  See the new instructions.     traZODone 100 MG tablet Commonly known as:  DESYREL Take 2 tablets (200   mg total) by mouth nightly as needed for Sleep What changed:  how much to   take     VENTOLIN HFA 108 (90 BASE) MCG/ACT inhaler Generic drug:  albuterol HFA   INHALE 2 PUFFS INTO THE LUNGS EVERY 4 HOURS AS NEEDED FOR WHEEZING OR   SHORTNESS OF BREATH.  SHAKE WELL BEFORE EACH USE. What changed:  See the   new instructions.       CONTINUE taking these medications         acetaminophen 500 mg tablet Commonly known as:  ACETAMINOPHEN EXTRA   STRENGTH Take 2 tablets (1,000 mg total) by mouth 3 times daily as needed     FOR PAIN. MAX/DAY=6 TABLETS. 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     * ipratropium-albuterol 0.5-2.5 (3) MG/3ML nebulizer solution Commonly   known as:  DUONEB Take 3 mLs by nebulization  4 times daily     amLODIPine 10 MG tablet Commonly known as:  NORVASC TAKE 1 TABLET BY   MOUTH EVERY DAY     ammonium lactate 12 % cream Commonly known as:  AMLACTIN     aspirin 325 MG tablet TAKE 1 TABLET BY MOUTH EVERY DAY.     atorvastatin 80 MG tablet Commonly known as:  LIPITOR Take 1 tablet (80   mg total) by mouth daily (with dinner)     benzonatate 100 MG capsule Commonly known as:  TESSALON Take 1 capsule   (100 mg total) by mouth 3 times daily as needed for Cough   Swallow whole.   Do not crush or chew.     blood glucose monitor kit Use as instructed     buPROPion 150 MG 24 hr tablet Commonly known as:  WELLBUTRIN XL     clopidogrel 75 MG tablet Commonly known as:  PLAVIX TAKE 1 TABLET BY   MOUTH DAILY     dextrose 40 % oral gel Commonly known as:  GLUTOSE Take 15 g by mouth   once as needed for Low blood sugar     DOCQLACE 100 MG capsule Generic drug:  docusate sodium TAKE 1 CAPSULE BY   MOUTH EVERY DAY     FREESTYLE LITE test strip Test 4 times daily as instructed.  ICD9 250.02;   Diabetes mellitus, uncontrolled.     generic DME Use as directed.      generic DME Use as directed.     humaLOG KwikPen 100 UNIT/ML injection pen Generic drug:  insulin lispro   INJECT 30 UNITS INTO THE SKIN THREE TIMES A DAY BEFORE MEALS.     hydrOXYzine HCl 50 MG tablet Commonly known as:  ATARAX Take 1 tablet (50   mg total) by mouth 3 times daily as needed for Itching or Anxiety     * insulin pen needle 31G X 5 MM Commonly known as:  BD ULTRA-FINE PEN   NEEDLE MINI Use 4 times daily as instructed with Humalog pen.  ICD-9   250.02  Diabetes mellitus, uncontrolled.     * insulin pen needle 31G X 5 MM Commonly known as:  BD ULTRA-FINE PEN   NEEDLE MINI Use nightly as instructed with Lantus pen.  ICD-9 250.02    Diabetes mellitus, uncontrolled.     K-Y LUBRICANT JELLY SENSITIVE Gel Use as directed     lancets Use 4 times daily as instructed for blood glucose testing.  ICD9   250.02; Diabetes mellitus, uncontrolled.     lidocaine 2 % jelly Commonly known as:  XYLOCAINE Apply topically as   needed for Pain     losartan-hydrochlorothiazide 100-25 MG per tablet Commonly known as:    HYZAAR TAKE 1 TABLET BY MOUTH EVERY DAY IN THE MORNING     meloxicam 15 MG tablet Commonly known as:  MOBIC TAKE 1 TABLET BY MOUTH   EVERY DAY. TAKE WITH FOOD.     metFORMIN 500 MG 24 hr tablet Commonly known as:  GLUCOPHAGE-XR Take 1   tablet (500 mg total) by mouth daily (with dinner)   Swallow whole. Do not   crush, break, or chew.     metoprolol 100 MG 24 hr tablet Commonly known as:  TOPROL-XL Take 1   tablet (100 mg total) by mouth daily     morphine 30 MG 12 hr tablet Commonly known as:  MS CONTIN Take 1 tablet   (30 mg total) by  mouth 2 times daily as needed for Severe Pain   Max daily   dose: 60 mg 15 day supply, weaning     nebulizer device Use as directed.     polyethylene glycol Pack powder Commonly known as:  GLYCOLAX/MIRALAX Take   17 g by mouth daily     polyvinyl alcohol 1.4 % ophthalmic solution Commonly known as:  LIQUIFILM   TEARS Place 1 drop into both eyes as needed for Dry Eyes      sertraline 100 MG tablet Commonly known as:  ZOLOFT Take 1 tablet (100 mg   total) by mouth daily     sodium chloride 0.65 % nasal spray Commonly known as:  OCEAN 1 spray by   Each Nare route as needed for Congestion     starch powder packet Commonly known as:  THICK-IT 2 Add to liquid or   pureed foods for thickening as needed.     SYMBICORT 160-4.5 MCG/ACT inhaler Generic drug:  budesonide-formoterol   Inhale 2 puffs into the lungs 2 times daily     tiotropium 18 MCG inhalation capsule Commonly known as:  SPIRIVA Inhale 1   capsule (18 mcg total) into the lungs daily     tolnaftate 1 % powder Commonly known as:  ANTI-FUNGAL Apply topically 2   times daily     * Notice:  This list has 7 medication(s) that are the same as other   medications prescribed for you. Read the directions carefully, and ask   your doctor or other care provider to review them with you.      Where to Get Your Medications    You need to pick up these prescriptions. We sent some of them to a   specific pharmacy. Go to these places to get your medications.         [facility] - [place], [place] - [address] -    VENTOLIN HFA 108 (90 BASE) MCG/ACT inhaler  [address] [place]   [address] Phone:  [phone]      You may get the following medications from   any pharmacy -  cefpodoxime 100 MG tablet -  ipratropium-albuterol 0.5-2.5   (3) MG/3ML nebulizer solution -  polyethylene glycol Pack powder                  CT RESULTS:   CT chest with contrast 08/01/2013: IMPRESSION:  1. There is a 4.1 x 3.6 cm focus of fat causing mass effect on the right latissimus dorsi muscle lateral to the right scapula. This may represent the possible abnormality which was seen on ultrasound and  is most consistent with a lipoma.  2. 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    ULTRASOUND RESULTS:   Chest [place] 07/31/2013: IMPRESSION:  Heterogeneous region of somewhat  thickened muscle, correlating with patient's area of concern. This finding is nonspecific and may represent inflammation. There is no evidence of focal fluid collection. If there is continued concern, consider ches

## 2013-08-04 NOTE — Progress Notes (Signed)
[facility]  Transportation Request Form / Physician Certification Statement   Please fax [facility] with face sheet [facility]: Fax [phone], Phone [phone] or [facility]: [phone] [phone], Phone [phone]    Patient Name:  Gloria Ray     Date of Birth:  February 12, 1956    Date of Service: 08/04/13  Time of pick up: 11:30am Patient Location:  W709/W709-02       MR#  [id number] Patient Destination: [name]   Number of steps into house?: 0  Requestors Name: Encarnacion ChuKaren Danniella Robben, LMSW  Call Back Number: [phone]  Payor : Medicaid  Transport for (chechat type of treatment or service, at least one):    [x]  Discharge              []  Diagnostic Test      []  Evaluation Test         []  Procedure      Specify what type of treatment or service: discharge home   Is this treatment or service available at sending facility?:      []  Yes    [x]  No  Requested Mode of Transport  []  One man Chairmobile** []  Two man Chairmobile**  (** See instruction  form)  Leg extension required:     []  Yes  []  No Patient has own chair: []  Yes    []  No  Wheelchair size:        []  Standard []  X-wide []  XX-wide  []  Round Trip []  One Way  []  Stretcher [name] (no medical attention needed)    []  [facility] []  [facility] []  Critical Care  VENDOR: _[employer]________   1. Medical condition that necessitates this mode of transport (i.e. oxygen, bed ridden, etc.):   Hypotension  2. What medical services are to be provided by crew?   []  Oxygen Monitoring:  Amount:      LPM  Can patient self-administer oxygen?  []  Yes []  No - What is limitation?        Does patient have own portable tank?    []  Yes     []  No   []  Airway Monitoring []  Monitor IV infusion []  Suctioning   []  Cardiac Monitoring   []  Vital Signs []  PRN Meds  []  Other_______________________________  [x]  None  3. Infection control needs (i.e. ORSA/VRE/Cdiff)       []  Yes    [x]  No  4. What specific handling is required?    []  Positioning []  Orthopedic Device    []   Restraints []  Other:        Reason for above:  []  Flight Risk     []  Severe Pain []  Morbid Obesity       []  Fall Precaution   []  Ortho/Spine Precaution         []  Decubitis > Stage 2 []  Risk of injury to self/others  5.  Patient mental status? [x]  Normal cognition []  Disoriented      Specify:        []  Psychological Disorder Specify:     6. At time of transport is bed confinement ordered?  []  Yes     [x]  No              Is patient bed confined?    []  Yes     [x]  No    Medical condition for bed confinement:     []  MD; []  Nurse;[x]  Discharge Planner     Name:  Encarnacion ChuKaren Irmgard Rampersaud,  LMSW         Date:  August 04, 2013        Signature:___________________________________________________

## 2013-08-04 NOTE — Telephone Encounter (Signed)
FYIDorene Grebe from [facility] called today, 08/04/13, regarding patient's ultrasound. Dorene Grebe stated patient is currently an in-patient at [facility] and had the ultrasound on 08/01/13. Dorene Grebe states patient's 08/05/13 appointment will be cancelled as same ultrasound was already performed. If there are any questions please call Dorene Grebe at [phone], extension [phone]. Thank you.

## 2013-08-04 NOTE — Plan of Care (Signed)
Mobility    • Patient's functional status is maintained or improved Completed or Resolved        Nutrition    • Patient's nutritional status is maintained or improved Completed or Resolved        Pain/Comfort    • Patient's pain or discomfort is manageable Completed or Resolved        Psychosocial    • Demonstrates ability to cope with illness Completed or Resolved        Safety    • Patient will remain free of falls Completed or Resolved

## 2013-08-04 NOTE — Progress Notes (Addendum)
Family Medicine Progress Note    Subjective:     Interval History: No events overnight.    Today, pt reports feeling much better today, ready to go home. Having some wheezing with amb, to get neb now. +constipation. No other concerns.    Objective:    Vital signs in last 24 hours:    Temp:  [36.5 C (97.7 F)-36.8 C (98.2 F)] 36.5 C (97.7 F)  Heart Rate:  [83-99] 83  Resp:  [18-20] 18  BP: (120-144)/(80-90) 140/80 mmHg    Exam:  Gen: well-appearing, in NAD  HEENT: NCAT.   Pulmonary: Mild scattered end exp wheezing b/l. Breathing non-labored.  Heart: Normal S1 & S2. RRR. No R/M/G.  Abdomen: obese, soft, non-tender, non-distended. No masses or HSM. BS present.  Extremities: 1+ pitting edema. 2+ pedal pulses symmetric bilaterally. Calves non-tender.  Neuro: Cranial nerves II-XII grossly intact.  Mental Status: Cooperative.    Labs:  Recent Results (from the past 24 hour(s))   POCT GLUCOSE    Collection Time     08/03/13 12:12 PM       Result Value Range    Glucose POCT 260 (*) 60 - 99 mg/dL   POCT GLUCOSE    Collection Time     08/03/13  5:21 PM       Result Value Range    Glucose POCT 285 (*) 60 - 99 mg/dL   POCT GLUCOSE    Collection Time     08/03/13  9:33 PM       Result Value Range    Glucose POCT 180 (*) 60 - 99 mg/dL   POCT GLUCOSE    Collection Time     08/04/13  2:36 AM       Result Value Range    Glucose POCT 197 (*) 60 - 99 mg/dL   BASIC METABOLIC PANEL    Collection Time     08/04/13  5:29 AM       Result Value Range    Glucose 200 (*) 60 - 99 mg/dL    Sodium 035  597 - 416 mmol/L    Potassium 4.1  3.3 - 5.1 mmol/L    Chloride 101  96 - 108 mmol/L    CO2 25  20 - 28 mmol/L    Anion Gap 14  7 - 16    UN 11  6 - 20 mg/dL    Creatinine 3.84 (*) 0.51 - 0.95 mg/dL    GFR,Caucasian 66      GFR,Black 76      Calcium 9.7  8.6 - 10.2 mg/dL   POCT GLUCOSE    Collection Time     08/04/13  8:14 AM       Result Value Range    Glucose POCT 242 (*) 60 - 99 mg/dL       Radiology:    [place] Chest 07/31/2013   IMPRESSION:    Heterogeneous region of somewhat thickened muscle, correlating with  patient's area of concern. This finding is nonspecific and may  represent inflammation. There is no evidence of focal fluid  collection. If there is continued concern, consider chest CT for  further assessment.   END OF REPORT    08/01/13 CT Chest 1. There is a 4.1 x 3.6 cm focus of fat causing mass effect on the right latissimus dorsi muscle lateral to the right scapula. This may represent the possible abnormality which was seen on ultrasound and   is most consistent with a  lipoma.  2. 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      Assessment/Plan:  Patient is a 58 y.o. female with a h/o MI, CVA, HTN, and prior admissions for AKI with hypotension who presented with dizziness and hypotension detected in PCP office admitted for AKI.  AKI has resolved; also had finding of cystitis that is on day #4 of treatment.    Renal/GU: AKI 2/2 UTI, dehydration/diarrhea and medications  - Urine cx shows +E.coli; day 4 of abx - continue with [facility] outpatient for total 7 days  - resume home morphine, meloxicam    CV: hypotension likely 2/2 urosepsis, DM2, history of MI and CVA  - BPs in the 140s today - cont home metoprolol, losartan-HCTZ; resume home amlodipine  - BG's still in the upper 100-mid 200s despite 80 units lantus - resume home regimen of Lantus 90 units on discharge and follow-up with PCP as scheduled  - continue home ASA 325mg  , plavix, lipitor    Resp: asthma vs COPD  - cont home duoneb QID with prn albuterol    GI: loose stools have resolved, now with constipation  - Cont home miralax, colace, prilosec    Neuro/Psych/pain: mental health issues, chronic pain?   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 Elisabeth Pigeonevine.    Author: Letta MoynahanMichelle Kosmalski, MD  Note created: 08/04/2013  at: 9:21 AM     I saw and evaluated the patient. I agree with the resident's findings and plan of care as documented above.    For d/c and outpt follow up today    Mayson Mcneish J Kenzlee Fishburn, DO

## 2013-08-04 NOTE — Progress Notes (Addendum)
SW met with pt per her request as her apartment complex needs some documentation prior to discharge.  SW explained to the pt that the medical team is currently working to ensure she has what she needs.  She will need assistance with transportation via medicab.  SW will assist.  Antonieta Iba, LMSW 08/04/2013 9:26 AM    SW faxed discharge paperwork to [facility] at [phone].  Antonieta Iba, LMSW 08/04/2013 9:57 AM

## 2013-08-05 ENCOUNTER — Telehealth: Payer: Self-pay

## 2013-08-05 NOTE — Telephone Encounter (Signed)
Pt discharged from hospital the nurse will visit the patient tomorrow.

## 2013-08-06 ENCOUNTER — Telehealth: Payer: Self-pay

## 2013-08-06 NOTE — Telephone Encounter (Signed)
Recertified complete for service today orders follow.

## 2013-08-07 ENCOUNTER — Ambulatory Visit: Payer: Self-pay | Admitting: Family Medicine

## 2013-08-08 ENCOUNTER — Encounter: Payer: Self-pay | Admitting: Gastroenterology

## 2013-08-08 NOTE — Progress Notes (Signed)
Gloria Ray is a  58 y.o. admitted on  08/01/2013    Admission Date: 08/01/2013    Based on the clinical information below this patient has the diagnosis of: Acute kidney insufficiency    It was present on admission.    Creatinine      Lab results: 08/04/13  0529 08/03/13  0612 08/02/13  0603   Creatinine 0.96* 0.91 0.98*     Lab results: Date   CR 2.04 5/28   CR 1.92 5/29 03:04   CR 1.40 5/29  12:08     Intravenous Fluids  Medication Last Rate Last Dose   1 LITER NS ON 5/28 AND NS @ 125MLS/HR     1 LITER NS ON 5/29 AND NS @ 125MLS/HR  NS @ 125MLS/HR 5/30-5/31         Diuretics  Completed Diuretic Medications    Ordered     Dose Route Frequency Start Stop    08/03/13 1221  hydrochlorothiazide (HYDRODIURIL) tablet 25 mg  Status:  Discontinued      25 mg PO  Daily 08/03/13 1300 08/04/13 1721          Angiotensin II receptor blockers  Angiotensin II receptor blockers    Ordered     Dose Route Frequency Start Stop    08/03/13 1221  losartan (COZAAR) tablet 100 mg  Status:  Discontinued      100 mg PO  Daily 08/03/13 1300 08/04/13 1721        Naviah Belfield J Caster Fayette, DO

## 2013-08-10 LAB — EKG 12-LEAD
P: 60 degrees
QRS: 1 degrees
Rate: 66 {beats}/min
Severity: ABNORMAL
Severity: ABNORMAL
Statement: BORDERLINE
T: 51 degrees

## 2013-08-14 ENCOUNTER — Encounter: Payer: Self-pay | Admitting: Gastroenterology

## 2013-08-15 ENCOUNTER — Ambulatory Visit: Payer: Self-pay | Admitting: Family Medicine

## 2013-08-15 ENCOUNTER — Encounter: Payer: Self-pay | Admitting: Family Medicine

## 2013-08-15 ENCOUNTER — Encounter: Payer: Self-pay | Admitting: Gastroenterology

## 2013-08-15 VITALS — BP 129/77 | HR 76 | Temp 97.7°F | Ht 64.17 in | Wt 214.0 lb

## 2013-08-15 DIAGNOSIS — B379 Candidiasis, unspecified: Secondary | ICD-10-CM

## 2013-08-15 DIAGNOSIS — Z79899 Other long term (current) drug therapy: Secondary | ICD-10-CM

## 2013-08-15 DIAGNOSIS — IMO0001 Reserved for inherently not codable concepts without codable children: Secondary | ICD-10-CM

## 2013-08-15 DIAGNOSIS — M1710 Unilateral primary osteoarthritis, unspecified knee: Secondary | ICD-10-CM

## 2013-08-15 DIAGNOSIS — N390 Urinary tract infection, site not specified: Secondary | ICD-10-CM

## 2013-08-15 DIAGNOSIS — M179 Osteoarthritis of knee, unspecified: Secondary | ICD-10-CM

## 2013-08-15 MED ORDER — FLUCONAZOLE 150 MG PO TABS *I*
150.0000 mg | ORAL_TABLET | Freq: Once | ORAL | Status: AC
Start: 2013-08-15 — End: 2013-08-15

## 2013-08-15 MED ORDER — CLOTRIMAZOLE 1 % VA CREA *I*
1.0000 | TOPICAL_CREAM | Freq: Every evening | VAGINAL | Status: DC
Start: 2013-08-15 — End: 2013-09-18

## 2013-08-15 MED ORDER — INSULIN GLARGINE 100 UNIT/ML SC SOPN *I*
PEN_INJECTOR | SUBCUTANEOUS | Status: AC
Start: 2013-08-15 — End: ?

## 2013-08-15 MED ORDER — MORPHINE SULFATE ER 30 MG PO TBCR *I*
30.0000 mg | ORAL_TABLET | Freq: Two times a day (BID) | ORAL | Status: DC | PRN
Start: 2013-08-15 — End: 2013-09-18

## 2013-08-15 NOTE — Progress Notes (Addendum)
Assessment & Plan        Gloria Ray is a 58 y.o. female and  has a past medical history of Asthma; COPD (chronic obstructive pulmonary disease); Coronary artery disease; Diabetes mellitus; Hypertension; Ischemic Stroke (03/06/2006); Prior Myocardial Infarction (03/19/2007); Unspecified cerebral artery occlusion with cerebral infarction; and Arthritis.       The patient evaluated today for: Knee Pain      Type II, uncontrolled.  Historically non-adherent with multiple medical co-morbids.  HTN and COPD well controlled now, getting DM controled should be focus as most likely to cause hospitalization at this time.  - insulin glargine (LANTUS SOLOSTAR) 100 UNIT/ML injection pen; Currently 70 Units qhs (split in 2 sites).  Increase by 2 units daily for fasting BG >150.  Max 100 Units qhs.  -     Declines Diabetes Health Source- likely needs help from home nursing to manage DM; would benefit from more contact with Care Manager, sending not to [facility]  -     DM HM labs due and ordered, patient is historically non-adherent  -     Consider adding aspirin  -     Needs monthly visits, [id number] HbA1c until controlled    DJD (degenerative joint disease) of knee, bilateral  - morphine (MS CONTIN) 30 MG 12 hr tablet; Take 1 tablet (30 mg total) by mouth 2 times daily as needed for Severe Pain   Max daily dose: 60 mg     Controlled substance agreement signed  - Pain clinic profile; Standing    UTI (lower urinary tract infection)  - POCT urinalysis dipstick; Standing    Yeast infection  - fluconazole (DIFLUCAN) 150 MG tablet; Take 1 tablet (150 mg total) by mouth once for 1 dose  - clotrimazole (LOTRIMIN) 1 % vaginal cream; Place 1 Applicatorful vaginally nightly         Subjective                      Chief Complaint: Knee Pain    HPI  Type II, uncontrolled       Continues to use only 70 units of Lantus qhs but splitting into 2 sites.  Reviewed again plan to escalate Lantus dose as DM far from controlled.  Denies headache,  vision changes, excessive thirst, increased urination, nausea, vomiting, chest pain, palpitations, SOB, dizziness, loss of consciousness or loss of motor function.     DJD (degenerative joint disease) of knee, bilateral        Going on vacation and requesting re-prescription of morphine because she is able to walk more and rely less on wheel chair.  Discussed need for long term plan- she is a poor surgical candidate due to uncontrolled DM and does not want knee replacement.  She has tried corticosteroid injections without good effect.  She has not tried visco-supplements.      UTI (lower urinary tract infection)         Recently hospitalized for sepsis with urinary source, finished antibiotics.  No symptoms at this time except thinks she has a yeast infection s/p antibiotics.  No sexual intercourse.      History       Reviewed and updated where appropriate based on HPI and patient report.      ROS       Pertinent positives and negatives of a 12-point review of systems noted above.       Objective   Recorded Vital Signs:  BP 129/77    Pulse 76    Temp(Src) 36.5 C (97.7 F) (Temporal)    Ht 1.63 m (5' 4.17")    Wt 97.07 kg (214 lb)    BMI 36.54 kg/m2                  Wt Readings from Last 7 Encounters:   08/15/13 97.07 kg (214 lb)   08/01/13 98.657 kg (217 lb 8 oz)   07/31/13 97.07 kg (214 lb)   07/22/13 97.977 kg (216 lb)   07/15/13 95.255 kg (210 lb)   07/04/13 93.441 kg (206 lb)   06/18/13 93.441 kg (206 lb)       Physical Exam   VITALS: Reviewed above and WNL.  BMI category is moderate obesity.  GEN: Well developed, well nourished, appears stated age, no acute distress  HEENT: Atraumatic.  EOM normal, conjunctiva clear, no blepharitis.  Moist mucus membranes, no oropharyngeal lesions.  Trachea midline, no lymphadenopathy or thyromegaly.  RESP: Unlabored breaths.  Lungs are clear to auscultation bilaterally with good effort.  No crackles, rales, or wheezes appreciated.  CV: Regular rhythm, S1, S2.  No  S3, S4, murmurs or rubs.   ABD: Bowel sounds normal.  Soft, non-tender, non-distended.   EXT: Warm and well perfused.  No edema.   SKIN: Warm and dry. No rashes, ecchymoses, or petechiae of exposed surfaces  NEURO:  Alert.  Cranial nerves grossly intact.  Strength is normal and symmetric.  Gait is antalgic, uses walker for support.  Balance is impaired.    PSYCH:  Patient well dressed and well groomed, sitting in chair.  Speech is normal.  Mood is euthymic.  Affect is congruent with mood.  Thought content is linear with no expressed paranoia, delusions, or hallucinations.  Judgement is poor  Insight is poor.            Note in APSO format, see top for Assessment & Plan.     Everlean PattersonMatthew Dajohn Ellender MD, PhD   R3 [facility]   08/15/2013  1:56 PM       --------------------    Preceptor Attestation - Patient Seen    I saw and evaluated the patient. I agree with the resident's/fellow's findings and plan of care as documented above.   Check HA1C, microalbumin, consider DM ed tune-up, and cont to pursue alternatives to MScontin    Ladon Applebaumebra A Renner, MD

## 2013-08-18 DIAGNOSIS — F32A Depression, unspecified: Secondary | ICD-10-CM

## 2013-08-18 DIAGNOSIS — F419 Anxiety disorder, unspecified: Secondary | ICD-10-CM

## 2013-08-21 ENCOUNTER — Other Ambulatory Visit: Payer: Self-pay

## 2013-08-21 DIAGNOSIS — F419 Anxiety disorder, unspecified: Secondary | ICD-10-CM

## 2013-08-21 DIAGNOSIS — F32A Depression, unspecified: Secondary | ICD-10-CM

## 2013-08-21 DIAGNOSIS — G4700 Insomnia, unspecified: Secondary | ICD-10-CM

## 2013-08-21 DIAGNOSIS — IMO0001 Reserved for inherently not codable concepts without codable children: Secondary | ICD-10-CM

## 2013-08-23 MED ORDER — GABAPENTIN 300 MG PO CAPS
900.0000 mg | ORAL_CAPSULE | Freq: Every evening | ORAL | Status: AC
Start: 2013-08-21 — End: ?

## 2013-08-23 MED ORDER — BUSPIRONE HCL 30 MG PO TABS *I*
30.0000 mg | ORAL_TABLET | Freq: Two times a day (BID) | ORAL | Status: DC
Start: 2013-08-21 — End: 2013-09-18

## 2013-08-23 MED ORDER — HYDROXYZINE HCL 50 MG PO TABS *I*
50.0000 mg | ORAL_TABLET | Freq: Three times a day (TID) | ORAL | Status: AC | PRN
Start: 2013-08-21 — End: ?

## 2013-08-25 ENCOUNTER — Encounter: Payer: Self-pay | Admitting: Gastroenterology

## 2013-08-25 ENCOUNTER — Telehealth: Payer: Self-pay

## 2013-08-25 NOTE — Telephone Encounter (Signed)
Hello,    No [facility] data received on 08/23/2013 and 08/24/2013 and oxygen level was 90. Everything else was normal    Thanks,

## 2013-08-27 ENCOUNTER — Encounter: Payer: Self-pay | Admitting: Gastroenterology

## 2013-08-29 ENCOUNTER — Telehealth: Payer: Self-pay

## 2013-08-29 NOTE — Telephone Encounter (Signed)
Hello,    Gloria Ray stated the patient insurance company need a [id number] updated form fax to them in order to for patient to have back her aide service. The fax number is [phone] attn [facility]     Thanks,

## 2013-08-29 NOTE — Telephone Encounter (Signed)
Patient called today stating she needs a referral for home care agency, patient states home care has been discontinued. Please call patient to follow up. Thank you.

## 2013-09-02 ENCOUNTER — Other Ambulatory Visit: Payer: Self-pay | Admitting: Family Medicine

## 2013-09-02 NOTE — Telephone Encounter (Signed)
Could you contact pt's home care agency to find out why services were discontinued? Thanks!

## 2013-09-03 ENCOUNTER — Ambulatory Visit
Admit: 2013-09-03 | Discharge: 2013-10-03 | Disposition: A | Discharge status: Home / Self Care | Payer: Self-pay | Source: Ambulatory Visit | Attending: Psychiatry | Admitting: Psychiatry

## 2013-09-03 ENCOUNTER — Encounter: Payer: Self-pay | Admitting: Gastroenterology

## 2013-09-03 NOTE — Telephone Encounter (Signed)
Services ran out in April need new script to restart services

## 2013-09-03 NOTE — Telephone Encounter (Signed)
Pt aide services were canceled d/t no script refill done at 6 month mark all cervices on a 6 month duration [facility] Pt denies  Need Dr Newton PiggMihok to write a new script and will fax script to [employer] at [phone]   ATTENTION [facility]

## 2013-09-05 ENCOUNTER — Encounter: Payer: Self-pay | Admitting: Gastroenterology

## 2013-09-11 ENCOUNTER — Encounter: Payer: Self-pay | Admitting: Gastroenterology

## 2013-09-15 NOTE — Telephone Encounter (Signed)
Fritz PickerelMeehan, Elizabeth A, MD  Dorthy CoolerGariti, Sharleen Szczesny, RN     Caller: Unspecified (2 weeks ago)                [facility] and put in box for attending co-signature. Not sure if anything else is needed but if so, please let me know. Thanks!!    Ladon ApplebaumLiz Meehan

## 2013-09-16 ENCOUNTER — Telehealth: Payer: Self-pay

## 2013-09-16 ENCOUNTER — Ambulatory Visit: Payer: Self-pay | Admitting: Geriatric Medicine

## 2013-09-16 ENCOUNTER — Encounter: Payer: Self-pay | Admitting: Geriatric Medicine

## 2013-09-16 NOTE — Telephone Encounter (Signed)
TC to patient re: no show for today's visit.  Pt stated, "I thought my appointment was tomorrow".  States she needs Annice PihJackie @ [facility] to refill her mediset.  Contact Jackie and left voicemail.  Pt interested in r/s'ing appointment.

## 2013-09-18 ENCOUNTER — Telehealth: Payer: Self-pay

## 2013-09-18 ENCOUNTER — Encounter: Payer: Self-pay | Admitting: Gastroenterology

## 2013-09-18 ENCOUNTER — Encounter: Payer: Self-pay | Admitting: Geriatric Medicine

## 2013-09-18 ENCOUNTER — Other Ambulatory Visit: Payer: Self-pay | Admitting: Family Medicine

## 2013-09-18 ENCOUNTER — Ambulatory Visit: Payer: Self-pay | Admitting: Geriatric Medicine

## 2013-09-18 VITALS — BP 139/86 | HR 85 | Temp 97.7°F | Ht 64.17 in | Wt 214.0 lb

## 2013-09-18 DIAGNOSIS — I10 Essential (primary) hypertension: Secondary | ICD-10-CM

## 2013-09-18 DIAGNOSIS — M170 Bilateral primary osteoarthritis of knee: Secondary | ICD-10-CM

## 2013-09-18 DIAGNOSIS — B379 Candidiasis, unspecified: Secondary | ICD-10-CM

## 2013-09-18 DIAGNOSIS — M1710 Unilateral primary osteoarthritis, unspecified knee: Secondary | ICD-10-CM

## 2013-09-18 DIAGNOSIS — J449 Chronic obstructive pulmonary disease, unspecified: Secondary | ICD-10-CM

## 2013-09-18 DIAGNOSIS — F32A Depression, unspecified: Secondary | ICD-10-CM

## 2013-09-18 DIAGNOSIS — IMO0001 Reserved for inherently not codable concepts without codable children: Secondary | ICD-10-CM

## 2013-09-18 DIAGNOSIS — N760 Acute vaginitis: Secondary | ICD-10-CM

## 2013-09-18 DIAGNOSIS — M179 Osteoarthritis of knee, unspecified: Secondary | ICD-10-CM

## 2013-09-18 LAB — DRUG SCREEN CHEMICAL DEPENDENCY, URINE
Amphetamine,UR: NEGATIVE
Benzodiazepinen,UR: NEGATIVE
Cocaine/Metab,UR: POSITIVE
Opiates,UR: NEGATIVE
THC Metabolite,UR: NEGATIVE

## 2013-09-18 MED ORDER — INSULIN LISPRO (HUMAN) 100 UNIT/ML SC SOPN *I*
30.0000 [IU] | PEN_INJECTOR | Freq: Three times a day (TID) | SUBCUTANEOUS | Status: AC
Start: 2013-09-18 — End: ?

## 2013-09-18 MED ORDER — LOSARTAN POTASSIUM-HCTZ 100-25 MG PO TABS *A*
1.0000 | ORAL_TABLET | Freq: Every morning | ORAL | Status: DC
Start: 2013-09-18 — End: 2013-11-12

## 2013-09-18 MED ORDER — ALBUTEROL SULFATE HFA 108 (90 BASE) MCG/ACT IN AERS *I*
2.0000 | INHALATION_SPRAY | RESPIRATORY_TRACT | Status: AC | PRN
Start: 2013-09-18 — End: ?

## 2013-09-18 MED ORDER — MORPHINE SULFATE ER 15 MG PO TBCR *I*
15.0000 mg | ORAL_TABLET | Freq: Two times a day (BID) | ORAL | Status: AC | PRN
Start: 2013-09-18 — End: ?

## 2013-09-18 MED ORDER — FLUCONAZOLE 150 MG PO TABS *I*
150.0000 mg | ORAL_TABLET | Freq: Once | ORAL | Status: AC
Start: 2013-09-18 — End: 2013-09-18

## 2013-09-18 MED ORDER — METOPROLOL SUCCINATE 100 MG PO TB24 *I*
100.0000 mg | ORAL_TABLET | Freq: Every day | ORAL | Status: DC
Start: 2013-09-18 — End: 2013-11-12

## 2013-09-18 MED ORDER — BUSPIRONE HCL 30 MG PO TABS *I*
30.0000 mg | ORAL_TABLET | Freq: Two times a day (BID) | ORAL | Status: DC
Start: 2013-09-18 — End: 2013-11-12

## 2013-09-18 MED ORDER — IPRATROPIUM-ALBUTEROL 18-103 MCG/ACT IN AERO *I*
1.0000 | INHALATION_SPRAY | RESPIRATORY_TRACT | Status: DC | PRN
Start: 2013-09-18 — End: 2013-09-19

## 2013-09-18 MED ORDER — CLOTRIMAZOLE 1 % VA CREA *I*
TOPICAL_CREAM | VAGINAL | Status: AC
Start: 2013-09-18 — End: ?

## 2013-09-18 MED ORDER — AMLODIPINE BESYLATE 10 MG PO TABS *I*
10.0000 mg | ORAL_TABLET | Freq: Every day | ORAL | Status: DC
Start: 2013-09-18 — End: 2013-11-12

## 2013-09-18 MED ORDER — SERTRALINE HCL 100 MG PO TABS *I*
100.0000 mg | ORAL_TABLET | Freq: Every day | ORAL | Status: AC
Start: 2013-09-18 — End: 2014-03-17

## 2013-09-18 MED ORDER — ASPIRIN 325 MG PO TABS *I*
325.0000 mg | ORAL_TABLET | Freq: Every day | ORAL | Status: AC
Start: 2013-09-18 — End: ?

## 2013-09-18 NOTE — Telephone Encounter (Signed)
Patient seen on clinic today 09/18/2013.

## 2013-09-18 NOTE — Telephone Encounter (Signed)
[facility] [place] is [facility] at this time refilling her mediset.

## 2013-09-18 NOTE — Telephone Encounter (Signed)
[facility] does NOT have the Combivent. They have a "Combivent Respimat" instead. Can a script for that please be done in place of the Combivent?. Thank You!!!

## 2013-09-18 NOTE — Progress Notes (Signed)
CC:  Vaginitis, med refills    Pt presents today for medication refills. She has been out of several for about a week because someone in her family took her med bag when she was on vacation in [place]. She most specifically needs her inhalers and some pain medication. She is having a lot of difficulty managing her medications at home.    S:    1. DM2:  - has been increasing nightly Lantus dose due to high AM BGs - currently taking 100 units nightly (divided into two sites) and 30 units humalog with meals.   - has not had any symptoms of hypoglycemia - no shakes, dizziness  - does not have log of BGs today    2. COPD:  - feels like her combivent and albuterol help her the most  - no acute worsening in SOB since being out of inhalers but does have some chronic discomfort  - only ambulates with rolling walker or uses wheelchair    3. Knee pain:  - persistent BL knee pain - no acute changes today but would like medications refilled. Has been using morphine with some relief but has been out of it for a week - unclear if she went through any withdrawal. Per last CCP, was planning taper.    4. Vaginitis:  - no recent abx but this has been recurrent problem for her.     5. HTN:  - no CP, +HA, worse when she doesn't take BP meds    Medications and allergies personally reviewed and updated in the chart.    Soc:  Former smoker, quit when she had her stroke in 2008    Review of Systems   Constitutional: Negative for fever, chills and weight loss.   Respiratory: Positive for shortness of breath.    Cardiovascular: Negative for chest pain.   Musculoskeletal: Positive for joint pain.   Skin: Positive for itching (vaginal). Negative for rash.   Neurological: Positive for headaches. Negative for dizziness.   Psychiatric/Behavioral: Positive for depression.        O:  BP 139/86    Pulse 85    Temp(Src) 36.5 C (97.7 F) (Temporal)    Ht 1.63 m (5' 4.17")    Wt 97.07 kg (214 lb)    BMI 36.54 kg/m2     General: chronically ill  but well-nourished woman sitting in wheelchair in no acute distress    A/P:    1. Type II, uncontrolled  Last A1c elevated at 14.1 - given extensive med review today will defer recheck until next appt. No symptoms, but this is too high for where I would like her to be. Will discuss routine checks of BGs with Care Manager, whom pt met today.  - insulin lispro (HUMALOG KWIKPEN) 100 UNIT/ML injection pen; Inject 30 Units into the skin 3 times daily (before meals)  Dispense: 15 mL; Refill: 5    2. DJD (degenerative joint disease) of knee, bilateral  Refill morphine today per taper recommended by CCP. No UDS since 06/2012 - will recheck today if possible.  - Drug screen chemical dependency, urine    3. COPD (chronic obstructive pulmonary disease)  Refill inhalers as needed. No acute flare at this time.  - albuterol HFA (VENTOLIN HFA) 108 (90 BASE) MCG/ACT inhaler; Inhale 2 puffs into the lungs every 4 hours as needed    FOR WHEEZING OR SHORTNESS OF BREATH. SHAKE WELL BEFORE EACH USE.  Dispense: 1 Inhaler; Refill: 5  4. Vaginitis  Likely persistent due to elevated BGs. Will trial repeat fluconazole followed by clotrimazole - will follow up by working on BG control.  - clotrimazole (LOTRIMIN) 1 % vaginal cream; One applicator-full nightly for one week 3 days after taking fluconazole.  Dispense: 45 g; Refill: 1    5. Primary osteoarthritis of both knees  As above, prescription below.  - morphine (MS CONTIN) 15 MG 12 hr tablet; Take 1 tablet (15 mg total) by mouth 2 times daily as needed for Severe Pain   Max daily dose: 30 mg  Dispense: 60 tablet; Refill: 0    6. Hypertension  Currently well-controlled, continue current regimen as below.  - metoprolol (TOPROL-XL) 100 MG 24 hr tablet; Take 1 tablet (100 mg total) by mouth daily  Dispense: 30 tablet; Refill: 5  - losartan-hydrochlorothiazide (HYZAAR) 100-25 MG per tablet; Take 1 tablet by mouth every morning  Dispense: 30 tablet; Refill: 5  - amLODIPine (NORVASC) 10 MG  tablet; Take 1 tablet (10 mg total) by mouth daily  Dispense: 30 tablet; Refill: 1  - aspirin 325 MG tablet; Take 1 tablet (325 mg total) by mouth daily  Dispense: 30 tablet; Refill: 2    7. Depression  Mildly well-controlled. Continue current medications for now - may consider increase in sertraline if needed.  - sertraline (ZOLOFT) 100 MG tablet; Take 1 tablet (100 mg total) by mouth daily   . This is the same medication as Zoloft.  Dispense: 30 tablet; Refill: 5  - busPIRone (BUSPAR) 30 MG tablet; Take 1 tablet (30 mg total) by mouth 2 times daily  Dispense: 60 tablet; Refill: 0    Follow up: 2-4 wks.  Blenda Nicely, MD  Family Medicine R3  09/20/2013 1:06 PM       Addendum: UDS showing cocaine. No further controlled substances at this time.  --------------------    Preceptor Attestation - Patient Seen    I saw and evaluated the patient. I agree with the resident's/fellow's findings and plan of care as documented above.     DJD- pain meds, urine tox  Copd  DM II  Care manager    Nolon Nations MD

## 2013-09-19 LAB — COCAINE, URINE, CONFIRMATION: Confirm COC/METAB: POSITIVE

## 2013-09-19 MED ORDER — IPRATROPIUM-ALBUTEROL 20-100 MCG/ACT IN AERS *I*
1.0000 | INHALATION_SPRAY | Freq: Four times a day (QID) | RESPIRATORY_TRACT | Status: AC
Start: 2013-09-19 — End: 2014-03-18

## 2013-09-19 NOTE — Telephone Encounter (Signed)
lmtcb

## 2013-09-19 NOTE — Telephone Encounter (Signed)
New prescription sent to pharmacy, could you please call pt to inform? Thank you!

## 2013-09-20 ENCOUNTER — Encounter: Payer: Self-pay | Admitting: Gastroenterology

## 2013-09-22 ENCOUNTER — Telehealth: Payer: Self-pay

## 2013-09-22 NOTE — Telephone Encounter (Signed)
09/22/13 [facility] [place]. Pt has not transmitted her vital signs using the Telehealth system for the past five days. Pt has a different excuse each day as to why this was not done. [facility] may need to remove equipment from Pt's home if Pt neglects being monitored. Wanted Provider aware.

## 2013-09-22 NOTE — Telephone Encounter (Signed)
Will route to CM to have her call to discuss with pt - may need to explain to pt that this is important to her care especially while transitioning to new provider and attempting to optimize medication regimen.    Racheal PatchesMorgan Caila Cirelli, MD  Family Medicine R3  09/22/2013 5:22 PM

## 2013-09-23 NOTE — Telephone Encounter (Signed)
TC to patient after call from Early Chars, care manager at [facility], who states she has never met the patient but was responding to a referral generated in this office for patient for health home.  Pt denies having care manager and wishes to stay with health home at [facility].      D/W patient telehealth and monitoring.  Instructed to take her medications and then connect to telehealth so vitals can be transmitted.  She verbalized understanding and stated she would do so.    D/W patient recent urine tox and pt denies having any other drugs, states she was "in a room with someone smoking crack", "but I didn't have none".  Stated I would share with Dr. Jobe Igo.    Pt confirmed her appt in August and will keep the appointment.  Denies any plans to move to [facility] (as per Early Chars, CM[facility]).      Will touch base with patient re: telehealth tomorrow.

## 2013-09-25 ENCOUNTER — Encounter: Payer: Self-pay | Admitting: Gastroenterology

## 2013-09-29 ENCOUNTER — Other Ambulatory Visit: Payer: Self-pay | Admitting: Family Medicine

## 2013-09-30 ENCOUNTER — Telehealth: Payer: Self-pay

## 2013-09-30 NOTE — Telephone Encounter (Signed)
Wants to discuss medications.

## 2013-10-01 NOTE — Telephone Encounter (Signed)
Pt had lost medication, but has since found it. Also asking about aide form. Found form in Media and asked secs to re-fax form.

## 2013-10-03 ENCOUNTER — Encounter: Payer: Self-pay | Admitting: Gastroenterology

## 2013-10-04 ENCOUNTER — Ambulatory Visit
Admit: 2013-10-04 | Discharge: 2013-11-03 | Disposition: A | Discharge status: Home / Self Care | Payer: Self-pay | Source: Ambulatory Visit | Attending: Psychiatry | Admitting: Psychiatry

## 2013-10-10 ENCOUNTER — Encounter: Payer: Self-pay | Admitting: Gastroenterology

## 2013-10-10 ENCOUNTER — Telehealth: Payer: Self-pay

## 2013-10-10 NOTE — Telephone Encounter (Signed)
Noted, thank you. May need co-signature by Dr. Hurman Horneutchki.    Racheal PatchesMorgan Latica Hohmann, MD  Family Medicine R3  10/10/2013 1:26 PM

## 2013-10-10 NOTE — Telephone Encounter (Signed)
10/09/13 [facility]. [facility] is is submitting a UAS assessment requested by [employer], Pt's insurance. Orders to follow after approval by [employer]. Wanted Provider aware.

## 2013-10-13 ENCOUNTER — Other Ambulatory Visit: Payer: Self-pay | Admitting: Geriatric Medicine

## 2013-10-13 DIAGNOSIS — M170 Bilateral primary osteoarthritis of knee: Secondary | ICD-10-CM

## 2013-10-17 ENCOUNTER — Ambulatory Visit: Payer: Self-pay | Admitting: Geriatric Medicine

## 2013-10-20 ENCOUNTER — Encounter: Payer: Self-pay | Admitting: Gastroenterology

## 2013-10-22 ENCOUNTER — Other Ambulatory Visit: Payer: Self-pay | Admitting: Geriatric Medicine

## 2013-10-30 ENCOUNTER — Encounter: Payer: Self-pay | Admitting: Gastroenterology

## 2013-11-03 ENCOUNTER — Emergency Department (HOSPITAL_COMMUNITY)
Admission: EM | Admit: 2013-11-03 | Discharge: 2013-11-03 | Disposition: A | Discharge status: Home / Self Care | Payer: Medicaid Other | Attending: Emergency Medicine | Admitting: Emergency Medicine

## 2013-11-03 ENCOUNTER — Encounter (HOSPITAL_COMMUNITY): Payer: Self-pay | Admitting: Emergency Medicine

## 2013-11-03 ENCOUNTER — Emergency Department (HOSPITAL_COMMUNITY): Payer: Medicaid Other

## 2013-11-03 DIAGNOSIS — R739 Hyperglycemia, unspecified: Secondary | ICD-10-CM

## 2013-11-03 DIAGNOSIS — Z8673 Personal history of transient ischemic attack (TIA), and cerebral infarction without residual deficits: Secondary | ICD-10-CM | POA: Insufficient documentation

## 2013-11-03 DIAGNOSIS — J45909 Unspecified asthma, uncomplicated: Secondary | ICD-10-CM | POA: Insufficient documentation

## 2013-11-03 DIAGNOSIS — Z794 Long term (current) use of insulin: Secondary | ICD-10-CM | POA: Insufficient documentation

## 2013-11-03 DIAGNOSIS — Z7982 Long term (current) use of aspirin: Secondary | ICD-10-CM | POA: Insufficient documentation

## 2013-11-03 DIAGNOSIS — Z79899 Other long term (current) drug therapy: Secondary | ICD-10-CM | POA: Insufficient documentation

## 2013-11-03 DIAGNOSIS — R599 Enlarged lymph nodes, unspecified: Secondary | ICD-10-CM | POA: Diagnosis not present

## 2013-11-03 DIAGNOSIS — E7800 Pure hypercholesterolemia, unspecified: Secondary | ICD-10-CM | POA: Insufficient documentation

## 2013-11-03 DIAGNOSIS — Z7901 Long term (current) use of anticoagulants: Secondary | ICD-10-CM | POA: Diagnosis not present

## 2013-11-03 DIAGNOSIS — I252 Old myocardial infarction: Secondary | ICD-10-CM | POA: Diagnosis not present

## 2013-11-03 DIAGNOSIS — I10 Essential (primary) hypertension: Secondary | ICD-10-CM | POA: Diagnosis not present

## 2013-11-03 DIAGNOSIS — E119 Type 2 diabetes mellitus without complications: Secondary | ICD-10-CM | POA: Insufficient documentation

## 2013-11-03 DIAGNOSIS — R221 Localized swelling, mass and lump, neck: Secondary | ICD-10-CM

## 2013-11-03 DIAGNOSIS — R21 Rash and other nonspecific skin eruption: Secondary | ICD-10-CM | POA: Diagnosis not present

## 2013-11-03 DIAGNOSIS — F17200 Nicotine dependence, unspecified, uncomplicated: Secondary | ICD-10-CM | POA: Insufficient documentation

## 2013-11-03 DIAGNOSIS — R591 Generalized enlarged lymph nodes: Secondary | ICD-10-CM

## 2013-11-03 DIAGNOSIS — R220 Localized swelling, mass and lump, head: Secondary | ICD-10-CM | POA: Insufficient documentation

## 2013-11-03 DIAGNOSIS — M129 Arthropathy, unspecified: Secondary | ICD-10-CM | POA: Diagnosis not present

## 2013-11-03 HISTORY — DX: Cerebral infarction, unspecified: I63.9

## 2013-11-03 HISTORY — DX: Type 2 diabetes mellitus without complications: E11.9

## 2013-11-03 HISTORY — DX: Pure hypercholesterolemia, unspecified: E78.00

## 2013-11-03 HISTORY — DX: Essential (primary) hypertension: I10

## 2013-11-03 HISTORY — DX: Unspecified asthma, uncomplicated: J45.909

## 2013-11-03 HISTORY — DX: Unspecified osteoarthritis, unspecified site: M19.90

## 2013-11-03 HISTORY — DX: Acute myocardial infarction, unspecified: I21.9

## 2013-11-03 LAB — BASIC METABOLIC PANEL
ANION GAP: 13 (ref 5–15)
BUN: 17 mg/dL (ref 6–23)
CO2: 24 meq/L (ref 19–32)
Calcium: 9.1 mg/dL (ref 8.4–10.5)
Chloride: 100 mEq/L (ref 96–112)
Creatinine, Ser: 0.95 mg/dL (ref 0.50–1.10)
GFR calc Af Amer: 76 mL/min — ABNORMAL LOW (ref >90)
GFR calc non Af Amer: 66 mL/min — ABNORMAL LOW (ref >90)
GLUCOSE: 427 mg/dL — AB (ref 70–99)
Potassium: 4.1 mEq/L (ref 3.7–5.3)
Sodium: 137 mEq/L (ref 137–147)

## 2013-11-03 LAB — CBC WITH DIFFERENTIAL/PLATELET
BASOS ABS: 0 10*3/uL (ref 0.0–0.1)
Basophils Relative: 0 % (ref 0–1)
Eosinophils Absolute: 0 10*3/uL (ref 0.0–0.7)
Eosinophils Relative: 1 % (ref 0–5)
HCT: 37.5 % (ref 36.0–46.0)
HEMOGLOBIN: 11.8 g/dL — AB (ref 12.0–15.0)
LYMPHS ABS: 2.1 10*3/uL (ref 0.7–4.0)
Lymphocytes Relative: 43 % (ref 12–46)
MCH: 25.8 pg — ABNORMAL LOW (ref 26.0–34.0)
MCHC: 31.5 g/dL (ref 30.0–36.0)
MCV: 82.1 fL (ref 78.0–100.0)
MONO ABS: 0.3 10*3/uL (ref 0.1–1.0)
Monocytes Relative: 6 % (ref 3–12)
NEUTROS ABS: 2.5 10*3/uL (ref 1.7–7.7)
Neutrophils Relative %: 50 % (ref 43–77)
Platelets: 254 10*3/uL (ref 150–400)
RBC: 4.57 MIL/uL (ref 3.87–5.11)
RDW: 13.7 % (ref 11.5–15.5)
WBC: 5 10*3/uL (ref 4.0–10.5)

## 2013-11-03 LAB — CBG MONITORING, ED: GLUCOSE-CAPILLARY: 282 mg/dL — AB (ref 70–99)

## 2013-11-03 MED ORDER — SODIUM CHLORIDE 0.9 % IV SOLN
INTRAVENOUS | Status: DC
  Administered 2013-11-03: 12:00:00 via INTRAVENOUS

## 2013-11-03 MED ORDER — DOXYCYCLINE HYCLATE 100 MG PO CAPS: 100.0000 mg | ORAL_CAPSULE | Freq: Two times a day (BID) | ORAL | Status: DC

## 2013-11-03 MED ORDER — DOXYCYCLINE HYCLATE 100 MG PO TABS
100.0000 mg | ORAL_TABLET | Freq: Once | ORAL | Status: AC
  Administered 2013-11-03: 100 mg via ORAL
  Filled 2013-11-03: qty 1

## 2013-11-03 MED ORDER — INSULIN LISPRO 100 UNIT/ML ~~LOC~~ SOLN: 30.0000 [IU] | Freq: Three times a day (TID) | SUBCUTANEOUS | Status: DC

## 2013-11-03 MED ORDER — INSULIN GLARGINE 100 UNIT/ML ~~LOC~~ SOLN: 100.0000 [IU] | Freq: Every day | SUBCUTANEOUS | Status: DC

## 2013-11-03 MED ORDER — INSULIN ASPART 100 UNIT/ML ~~LOC~~ SOLN
10.0000 [IU] | Freq: Once | SUBCUTANEOUS | Status: AC
  Administered 2013-11-03: 10 [IU] via INTRAVENOUS
  Filled 2013-11-03: qty 1

## 2013-11-03 MED ORDER — IOHEXOL 300 MG/ML  SOLN
75.0000 mL | Freq: Once | INTRAMUSCULAR | Status: AC | PRN
  Administered 2013-11-03: 75 mL via INTRAVENOUS

## 2013-11-03 NOTE — Discharge Instructions (Signed)
Half followup with a regular Dr. locally. Would recommend the wellness clinic [facility] place to check. Resource guide provided with phone numbers. The wellness clinic number is [phone]. Your insulin has been renewed. Take the antibiotic for the next 10 days. Followup of a lump under your chin is important in need to make sure that it goes away. Return for any newer worse symptoms.   Emergency Department Resource Guide 1) Find a Doctor and Pay Out of Pocket Although you won't have to find out who is covered by your insurance plan, it is a good idea to ask around and get recommendations. You will then need to call the office and see if the doctor you have chosen will accept you as a new patient and what types of options they offer for patients who are self-pay. Some doctors offer discounts or will set up payment plans for their patients who do not have insurance, but you will need to ask so you aren't surprised when you get to your appointment.  2) Contact Your Local Health Department Not all health departments have doctors that can see patients for sick visits, but many do, so it is worth a call to see if yours does. If you don't know where your local health department is, you can check in your phone book. The CDC also has a tool to help you locate your state's health department, and many state websites also have listings of all of their local health departments.  3) Find a [facility] If your illness is not likely to be very severe or complicated, you may want to try a walk in clinic. These are popping up all over the country in pharmacies, drugstores, and shopping centers. They're usually staffed by nurse practitioners or physician assistants that have been trained to treat common illnesses and complaints. They're usually fairly quick and inexpensive. However, if you have serious medical issues or chronic medical problems, these are probably not your best option.  No Primary Care  Doctor: - Call Health Connect at  [phone] - they can help you locate a primary care doctor that  accepts your insurance, provides certain services, etc. - Physician Referral Service- [phone]  Chronic Pain Problems: Organization         Address  Phone   Notes  [facility]  [phone] Patients need to be referred by their primary care doctor.   Medication Assistance: Organization         Address  Phone   Notes  [facility] Medication [facility] [address] [place], [place] [address] [phone] --Must be a resident of [facility] -- Must have NO insurance coverage whatsoever (no Medicaid/ Medicare, etc.) -- The pt. MUST have a primary care doctor that directs their care regularly and follows them in the community   MedAssist  [phone]   [employer]  [phone]    Agencies that provide inexpensive medical care: Organization         Address  Phone   Notes  [place]  [phone]   [facility]    [phone]   [facility] [place], [place] [address] [phone]   [place] [address] [place] [phone]   Planned Parenthood    [phone]   [facility]    [phone]   [place] and [place]  [address], [place] Phone:  [phone], Fax:  [phone] Hours of Operation:  9 am - 6 pm, M-F.  Also accepts Medicaid/Medicare and self-pay.  [facility] for [place] [address], [address], [place] Phone: [phone], Fax: [phone]. Hours of Operation:  8:30 am - 5:30 pm, M-F.  Also accepts Medicaid and self-pay.  [facility] [place] [address], [place] Phone: [phone]   [place], [place], [place] [phone], Ext. [phone] Mondays & Thursdays: 7-9 AM.  First 15 patients are seen on a first  come, first serve basis.    [place] Providers:  Organization         Address  Phone   Notes  [facility] [address], [place] [phone] Also accepts self-pay patients.  [facility] [address], [place]  [phone]   [place], [address], [place] [phone]   [facility] [address], [place] [phone]   Lucianne Lei [address], [place]   [phone] Only accepts [place] Access [place] patients after they have their name applied to their card.   Self-Pay (no insurance) in [facility]:  Organization         Address  Phone   Notes  Sickle Cell Patients, [facility] [place] [phone]   [facility] [place] [phone]   [facility] [place]  [place], [place], [place] [phone]   [facility]/Dr. Osei-Bonsu  [address], [place] or [address], [place] [phone] Phone number for both [place] and [place] locations is the same.  Urgent [facility] and [facility] [address], [place] [phone]   [facility] [address], [place] or [address] [phone]   [facility] [address], [place] [phone], phone; [phone], fax Sees patients 1st and 3rd Saturday of every month.  Must not qualify for public or private insurance (i.e. Medicaid, Medicare, [place] Health Choice, Veterans' Benefits)  Household income should be no more than 200% of the poverty level The clinic cannot treat you if you are pregnant or think you are pregnant  Sexually transmitted diseases are not treated at the clinic.    Dental Care: Organization          Address  Phone  Notes  [facility] [place] Department of [facility] [place] [phone] Accepts children up to age 13 who are enrolled in [place] or [place]; pregnant women with a Medicaid card; and children who have applied for Medicaid or [place] Health Choice, but were declined, whose parents can pay a reduced fee at time of service.  [facility] Department of [facility]  [address], [place] [phone] Accepts children up to age 8 who are enrolled in [place] or [place]; pregnant women with a Medicaid card; and children who have applied for Medicaid or [place] Health Choice, but were declined, whose parents can pay a reduced fee at time of service.  [facility] Adult Dental Access PROGRAM  [place] [phone] Patients are  seen by appointment only. Walk-ins are not accepted. [place] will see patients 20 years of age and older. Monday - Tuesday (8am-5pm) Most Wednesdays (8:30-5pm) $30 per visit, cash only  [facility] Adult Dental Access PROGRAM  [address], [facility] [phone] Patients are seen by appointment only. Walk-ins are not accepted. [place] will see patients 57 years of age and older. One Wednesday Evening (Monthly: Volunteer Based).  $30 per visit, cash only  [place]  [phone] for adults; Children under age 71, call Graduate Pediatric Dentistry at [phone]. Children aged 38-14, please call [phone] to request a pediatric application.  Dental services are provided in all areas of dental care including fillings, crowns and bridges, complete and partial dentures, implants, gum treatment, root canals, and extractions. Preventive care is also provided. Treatment is provided to both adults and children. Patients are selected via a lottery and there is often a waiting list.   [facility] [address], [place]  [phone] [email or web address]   [facility] [address] [place], [place] [phone], Ext. [phone] Second and Fourth Thursday of each month, opens at 6:30 AM; Clinic ends at 9 AM.  Patients are seen on a first-come first-served basis, and a limited number are seen during each clinic.   [facility]  [address] Hillard Danker [place], [place] [phone]   Eligibility Requirements You must have lived in [place], [place], or [place] counties for at least the last three months.   You cannot be eligible for state or federal sponsored [employer], including [employer], [place], or [employer].   You generally cannot be eligible for healthcare insurance through your employer.    How to apply: Eligibility screenings are held every Tuesday and Wednesday afternoon from 1:00 pm until 4:00 pm. You do not need an appointment for the interview!  [facility] [address] [place], [place]   [place]  [place] Department  [place]  [phone]    Behavioral Health Resources in the Community: Intensive Outpatient Programs Organization         Address  Phone  Notes  [place]. [address] [place], [place] [phone]   [facility] [address], [place], [place]   ADS: Alcohol & Drug S[facility] [address] [place], [place]   [place] [address]  [place], [place] or [phone]   Substance Abuse Resources Organization         Address  Phone  Notes  Alcohol and Drug Services  [phone]   [place]  [phone]   [facility] [place]   Chinita Pester  [phone]   Residential & Outpatient Substance Abuse Program  [phone]   Psychological  Services Organization         Address  Phone  Notes  [facility] [place]  [place]  [phone]   [place] [address] [place] or [phone]    Mobile Crisis Teams Organization         Address  Phone  Notes  [facility], Mobile Crisis Care Unit  [phone]   Assertive Psychotherapeutic Services  [address] [place], [place]   [facility] [address] [place]  [place] [phone]    Self-Help/Support Groups Organization         Address  Phone             Notes  [facility] [place] - variety of support groups  [place] Call for more information  Narcotics Anonymous (NA), [facility]aring [facility] [address], [employer] [place]  2 meetings at this location   Special educational needs teacher         Address  Phone  Notes  ASAP Residential Treatment [place],    [place]  [phone]   [facility]  [address], [place] [phone], [place], [place]   [place], [place] [phone] Admissions: 8am-3pm M-F  Incentives Substance [place] [address],    [place], [place] [phone]   [facility] [address] [place], [place], [place]   [facility] [address],  [place], [place]   [facility] Programs - Intensive Outpatient [address], [place], [place]   [facility] [address].,  [place], [place] [phone] or [phone]   [facility] Treatment Services (RTS) [address], [place], [place] Accepts Medicaid  [facility] [place] [address],  [place] [phone] Substance Abuse/Addiction Treatment   [facility] Organization         Address  Phone  Notes  [facility]  [phone]   Domenic Schwab, PhD [address] Arlis [place], [place]   [phone] or [phone]   [place], [place] [phone]   [facility] [address], [place], [place] [phone] Insurance/Medicaid/sponsorship through [facility] and Families [address]                                    [place], [place] [phone] [place] [address], [place] [phone]    Dr. Adele Schilder  [phone]   [facility] Dept. 1) [address] [place] 2) [place] 3)  [place] [address], [place] [phone]  [phone]   [place] [phone] or [phone] (After Hours)

## 2013-11-03 NOTE — ED Notes (Signed)
Pt placed on monitor. Pt monitored by blood pressure, pulse ox, and 5 lead.

## 2013-11-03 NOTE — ED Notes (Signed)
Pt placed on monitor upon return to room from CT. Pt monitored by blood pressure, 5 lead, and pulse ox.

## 2013-11-03 NOTE — ED Notes (Signed)
Patient transported to CT 

## 2013-11-03 NOTE — ED Provider Notes (Signed)
CSN: [id number]     Arrival date & time 11/03/13  1007 History   First MD Initiated Contact with Patient 11/03/13 1031     Chief Complaint  Patient presents with  . knot under chin      (Consider location/radiation/quality/duration/timing/severity/associated sxs/prior Treatment) The history is provided by the patient.   58 year old female recently moved down from [place]. Does not have a primary care Dr. locally. Noted a lump under her chin on Friday feels hard but is not tender. Also patient is almost out of her insulin. She has a history of asthma diabetes previous stroke coronary artery disease and hypertension. States she has her other meds. Patient denies any trouble swallowing. Denies any fevers. Denies any similar finding.  Past Medical History  Diagnosis Date  . Asthma   . Diabetes mellitus without complication   . Stroke     R sided weakness  . MI (myocardial infarction)   . Arthritis   . Hypertension   . Hypercholesterolemia    Past Surgical History  Procedure Laterality Date  . Cardiac surgery    . Foot surgery      bilateral   History reviewed. No pertinent family history. History  Substance Use Topics  . Smoking status: Current Every Day Smoker    Types: Cigarettes  . Smokeless tobacco: Not on file  . Alcohol Use: Yes   OB History   Grav Para Term Preterm Abortions TAB SAB Ect Mult Living                 Review of Systems  Constitutional: Negative for fever.  HENT: Negative for congestion and trouble swallowing.   Eyes: Negative for visual disturbance.  Respiratory: Negative for shortness of breath.   Cardiovascular: Negative for chest pain.  Gastrointestinal: Negative for abdominal pain.  Genitourinary: Negative for dysuria.  Musculoskeletal: Negative for back pain and neck pain.  Skin: Negative for rash and wound.  Neurological: Negative for headaches.  Hematological: Does not bruise/bleed easily.  Psychiatric/Behavioral: Negative for confusion.       Allergies  Keppra and Lisinopril  Home Medications   Prior to Admission medications   Medication Sig Start Date End Date Taking? Authorizing Provider  albuterol (PROVENTIL HFA;VENTOLIN HFA) 108 (90 BASE) MCG/ACT inhaler Inhale 2 puffs into the lungs every 4 (four) hours as needed for wheezing or shortness of breath.   Yes Historical Provider, MD  albuterol-ipratropium (COMBIVENT) 18-103 MCG/ACT inhaler Inhale 2 puffs into the lungs every 4 (four) hours.   Yes Historical Provider, MD  amLODipine (NORVASC) 10 MG tablet Take 10 mg by mouth daily.   Yes Historical Provider, MD  aspirin 325 MG tablet Take 325 mg by mouth daily.   Yes Historical Provider, MD  atorvastatin (LIPITOR) 80 MG tablet Take 80 mg by mouth daily with supper.   Yes Historical Provider, MD  buPROPion (WELLBUTRIN XL) 150 MG 24 hr tablet Take 150 mg by mouth every morning.   Yes Historical Provider, MD  busPIRone (BUSPAR) 30 MG tablet Take 30 mg by mouth 2 (two) times daily.   Yes Historical Provider, MD  clopidogrel (PLAVIX) 75 MG tablet Take 75 mg by mouth daily.   Yes Historical Provider, MD  dextrose (GLUTOSE) 40 % gel Take 15 g by mouth once as needed (for low blood sugar).   Yes Historical Provider, MD  docusate sodium (COLACE) 100 MG capsule Take 100 mg by mouth daily.   Yes Historical Provider, MD  gabapentin (NEURONTIN) 300 MG capsule  Take 900 mg by mouth every evening.   Yes Historical Provider, MD  insulin glargine (LANTUS) 100 UNIT/ML injection Inject 100 Units into the skin at bedtime.   Yes Historical Provider, MD  insulin lispro (HUMALOG) 100 UNIT/ML injection Inject 30 Units into the skin 3 (three) times daily before meals.   Yes Historical Provider, MD  losartan-hydrochlorothiazide (HYZAAR) 100-25 MG per tablet Take 1 tablet by mouth every morning.   Yes Historical Provider, MD  meloxicam (MOBIC) 15 MG tablet Take 15 mg by mouth daily.   Yes Historical Provider, MD  metoprolol succinate (TOPROL-XL) 100  MG 24 hr tablet Take 100 mg by mouth daily. Take with or immediately following a meal.   Yes Historical Provider, MD  morphine (MS CONTIN) 15 MG 12 hr tablet Take 15 mg by mouth every 12 (twelve) hours as needed (for severe pain).   Yes Historical Provider, MD  omeprazole (PRILOSEC) 40 MG capsule Take 40 mg by mouth daily.   Yes Historical Provider, MD  sertraline (ZOLOFT) 100 MG tablet Take 100 mg by mouth daily.   Yes Historical Provider, MD  traZODone (DESYREL) 100 MG tablet Take 200 mg by mouth at bedtime as needed for sleep.   Yes Historical Provider, MD  doxycycline (VIBRAMYCIN) 100 MG capsule Take 1 capsule (100 mg total) by mouth 2 (two) times daily. 11/03/13   Fredia Sorrow, MD  insulin glargine (LANTUS) 100 UNIT/ML injection Inject 1 mL (100 Units total) into the skin at bedtime. 11/03/13   Fredia Sorrow, MD  insulin lispro (HUMALOG) 100 UNIT/ML injection Inject 0.3 mLs (30 Units total) into the skin 3 (three) times daily with meals. 11/03/13   Fredia Sorrow, MD   BP 134/84  Pulse 71  Temp(Src) 97.4 F (36.3 C) (Oral)  Resp 20  Ht 5\' 4"  (1.626 m)  Wt 210 lb (95.255 kg)  BMI 36.03 kg/m2  SpO2 92% Physical Exam  Nursing note and vitals reviewed. Constitutional: She is oriented to person, place, and time. She appears well-developed and well-nourished. No distress.  HENT:  Head: Normocephalic and atraumatic.  Mouth/Throat: Oropharynx is clear and moist.  2 cm nodule underneath the chin midline not tender not erythematous no drainage. Floor the mouth is normal.  Eyes: Conjunctivae and EOM are normal. Pupils are equal, round, and reactive to light.  Neck: Normal range of motion.  Cardiovascular: Normal rate, regular rhythm and normal heart sounds.   No murmur heard. Pulmonary/Chest: Effort normal and breath sounds normal.  Abdominal: Soft. Bowel sounds are normal. There is no tenderness.  Neurological: She is alert and oriented to person, place, and time. No cranial nerve  deficit. She exhibits normal muscle tone. Coordination normal.  Skin: Skin is warm. Rash noted. No erythema.    ED Course  Procedures (including critical care time) Labs Review Labs Reviewed  CBC WITH DIFFERENTIAL - Abnormal; Notable for the following:    Hemoglobin 11.8 (*)    MCH 25.8 (*)    All other components within normal limits  BASIC METABOLIC PANEL - Abnormal; Notable for the following:    Glucose, Bld 427 (*)    GFR calc non Af Amer 66 (*)    GFR calc Af Amer 76 (*)    All other components within normal limits  CBG MONITORING, ED - Abnormal; Notable for the following:    Glucose-Capillary 282 (*)    All other components within normal limits   Results for orders placed during the hospital encounter of 11/03/13  CBC  WITH DIFFERENTIAL      Result Value Ref Range   WBC 5.0  4.0 - 10.5 K/uL   RBC 4.57  3.87 - 5.11 MIL/uL   Hemoglobin 11.8 (*) 12.0 - 15.0 g/dL   HCT 37.5  36.0 - 46.0 %   MCV 82.1  78.0 - 100.0 fL   MCH 25.8 (*) 26.0 - 34.0 pg   MCHC 31.5  30.0 - 36.0 g/dL   RDW 13.7  11.5 - 15.5 %   Platelets 254  150 - 400 K/uL   Neutrophils Relative % 50  43 - 77 %   Neutro Abs 2.5  1.7 - 7.7 K/uL   Lymphocytes Relative 43  12 - 46 %   Lymphs Abs 2.1  0.7 - 4.0 K/uL   Monocytes Relative 6  3 - 12 %   Monocytes Absolute 0.3  0.1 - 1.0 K/uL   Eosinophils Relative 1  0 - 5 %   Eosinophils Absolute 0.0  0.0 - 0.7 K/uL   Basophils Relative 0  0 - 1 %   Basophils Absolute 0.0  0.0 - 0.1 K/uL  BASIC METABOLIC PANEL      Result Value Ref Range   Sodium 137  137 - 147 mEq/L   Potassium 4.1  3.7 - 5.3 mEq/L   Chloride 100  96 - 112 mEq/L   CO2 24  19 - 32 mEq/L   Glucose, Bld 427 (*) 70 - 99 mg/dL   BUN 17  6 - 23 mg/dL   Creatinine, Ser 0.95  0.50 - 1.10 mg/dL   Calcium 9.1  8.4 - 10.5 mg/dL   GFR calc non Af Amer 66 (*) >90 mL/min   GFR calc Af Amer 76 (*) >90 mL/min   Anion gap 13  5 - 15  CBG MONITORING, ED      Result Value Ref Range   Glucose-Capillary 282  (*) 70 - 99 mg/dL     Imaging Review Ct Soft Tissue Neck W Contrast  11/03/2013   CLINICAL DATA:  Palpable submandibular mass  EXAM: CT NECK WITH CONTRAST  TECHNIQUE: Multidetector CT imaging of the neck was performed using the standard protocol following the bolus administration of intravenous contrast.  CONTRAST:  4mL OMNIPAQUE IOHEXOL 300 MG/ML  SOLN  COMPARISON:  None.  FINDINGS: Submental soft tissue density is seen in the midline measuring 1.0 x 1.4 cm, likely representing lymphadenopathy. No other pathologically enlarged lymph nodes are visualized within the neck. No evidence of abscess or inflammatory process.  Salivary glands and thyroid are normal in appearance. Larynx and epiglottis are otherwise unremarkable. Parapharyngeal soft tissue planes are unremarkable.  Incidental note is made of complete opacification of the right maxillary sinus.  IMPRESSION: 1.0 x 1.4 cm submental soft tissue mass, likely representing an enlarged lymph node. No other cervical lymphadenopathy or soft tissue mass identified.  Complete opacification right maxillary sinus.   Electronically Signed   By: Earle Gell M.D.   On: 11/03/2013 13:25     EKG Interpretation None      MDM   Final diagnoses:  Hyperglycemia  Lymphadenopathy of head and neck   Patient with local medical problems. Patient recently moved down here from [place]. Does not have a primary care or clinic followup. Patient is a diabetic has had strokes in the past. Patient states she is almost out of her insulin. Today's blood sugar was in the 400 range no evidence of metabolic acidosis. Patient given some IV regular  insulin and blood sugar improved from 400s down to 200s. Patient also is concerned about a lump under her chin. Workup for that seems to be consistent with a enlarged lymph node. Will treat with antibiotics for now in case this reactive. CT did not raise any concerns for multiple enlarged lymph nodes. Patient retrieve the doxycycline.  Resource guide provided to help her find a primary care Dr. Patient also referred to the wellness clinic.  Fredia Sorrow, MD 11/03/13 [phone]

## 2013-11-03 NOTE — ED Notes (Signed)
Pt CBG=282 reported to nurse

## 2013-11-03 NOTE — ED Notes (Signed)
Pt remains monitored by blood pressure, pulse ox, and 5 lead.  

## 2013-11-03 NOTE — ED Notes (Signed)
Patient states she started to notice a lump under he chin on Friday.   Patient states it feels hard when she touches it.

## 2013-11-04 ENCOUNTER — Ambulatory Visit
Admit: 2013-11-04 | Discharge: 2013-12-03 | Disposition: A | Discharge status: Home / Self Care | Payer: Self-pay | Source: Ambulatory Visit | Attending: Psychiatry | Admitting: Psychiatry

## 2013-11-12 ENCOUNTER — Other Ambulatory Visit: Payer: Self-pay

## 2013-11-12 ENCOUNTER — Other Ambulatory Visit: Payer: Self-pay | Admitting: Family Medicine

## 2013-11-12 DIAGNOSIS — F32A Depression, unspecified: Secondary | ICD-10-CM

## 2013-11-12 DIAGNOSIS — I10 Essential (primary) hypertension: Secondary | ICD-10-CM

## 2013-11-12 NOTE — Telephone Encounter (Signed)
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  She is having anxiety and high blood pressures and is requesting that buspirone and her blood pressure medications be sent to a pharmacy in [place].  She was directed to call [id number] and/or the on call clinician at [facility] for assistance.  She called back to report that her call was not answered.      Writer called [phone] and waited on hold for 4 minutes.  Requested patient be called by on call clinician or available staff member at [phone] to assess and provide for her primary care needs.    [name] MD, PhD  Maternal Child Health Fellow  11/12/2013 7:25 PM

## 2013-11-12 NOTE — Telephone Encounter (Signed)
FYI:  Returned call to pt.at [phone] to discuss her request for meds and to ascertain the name of the pharmacy in [place], left VM for CB.   No attempt at contacting the on-call provider until info can be obtained from pt. Awaiting return call. Thanks.

## 2013-11-13 ENCOUNTER — Telehealth: Payer: Self-pay

## 2013-11-13 ENCOUNTER — Other Ambulatory Visit: Payer: Self-pay

## 2013-11-13 DIAGNOSIS — F32A Depression, unspecified: Secondary | ICD-10-CM

## 2013-11-13 MED ORDER — BUSPIRONE HCL 30 MG PO TABS *I*
30.0000 mg | ORAL_TABLET | Freq: Two times a day (BID) | ORAL | Status: DC
Start: 2013-11-13 — End: 2013-11-13

## 2013-11-13 MED ORDER — METOPROLOL SUCCINATE 100 MG PO TB24 *I*
100.0000 mg | ORAL_TABLET | Freq: Every day | ORAL | Status: AC
Start: 2013-11-13 — End: ?

## 2013-11-13 MED ORDER — LOSARTAN POTASSIUM-HCTZ 100-25 MG PO TABS *A*
1.0000 | ORAL_TABLET | Freq: Every morning | ORAL | Status: AC
Start: 2013-11-13 — End: ?

## 2013-11-13 MED ORDER — AMLODIPINE BESYLATE 10 MG PO TABS *I*
10.0000 mg | ORAL_TABLET | Freq: Every day | ORAL | Status: AC
Start: 2013-11-13 — End: ?

## 2013-11-13 NOTE — Telephone Encounter (Signed)
OK 

## 2013-11-13 NOTE — Telephone Encounter (Signed)
Patient is no longer in [place].  Please res[facility] in [place], [place]

## 2013-11-13 NOTE — Telephone Encounter (Signed)
Pt has been diligent in searching for a new PCP in [place], but names provided to her at ED are not accepting new patients. In meantime pt is out of her meds. 9/9 [facility] into [place] [facility], but there is still a need for Plavix and the pen needles for her Stephanie Coup and [employer] both. Can a script for these items please be [facility] [place]?  Please advise [id number] pool so phcy can be notified. Thank You!!!

## 2013-11-13 NOTE — Telephone Encounter (Signed)
Nevermind, that still won't work. Thanks anyway

## 2013-11-15 MED ORDER — BUSPIRONE HCL 30 MG PO TABS *I*
30.0000 mg | ORAL_TABLET | Freq: Two times a day (BID) | ORAL | Status: AC
Start: 2013-11-15 — End: ?

## 2013-11-15 NOTE — Telephone Encounter (Signed)
I'm sorry, it's unclear to me - do you need me to send these prescriptions?  Thanks!  Lequita Halt

## 2013-11-17 NOTE — Telephone Encounter (Signed)
Prescriptions do NOT need to be sent as the writing prescriber needs to be signed up with [place] Medicaid before the phcy can fill them. Pt has so far been unsuccessful in finding a PCP in [place] that is taking new paitents. Will likely need to go to ED for renewals until one can be found and seen.

## 2013-12-03 ENCOUNTER — Encounter: Payer: Self-pay | Admitting: *Deleted

## 2013-12-03 ENCOUNTER — Ambulatory Visit: Payer: Medicaid Other | Admitting: Endocrinology

## 2013-12-04 ENCOUNTER — Ambulatory Visit
Admit: 2013-12-04 | Discharge: 2014-01-03 | Disposition: A | Discharge status: Home / Self Care | Payer: Self-pay | Source: Ambulatory Visit | Attending: Psychiatry | Admitting: Psychiatry

## 2015-04-04 ENCOUNTER — Emergency Department: Payer: Medicaid Other

## 2015-04-04 ENCOUNTER — Encounter: Payer: Self-pay | Admitting: Emergency Medicine

## 2015-04-04 ENCOUNTER — Inpatient Hospital Stay
Admission: EM | Admit: 2015-04-04 | Discharge: 2015-04-15 | DRG: 190 | Disposition: A | Discharge status: Home / Self Care | Payer: Medicaid Other | Attending: Internal Medicine | Admitting: Internal Medicine

## 2015-04-04 DIAGNOSIS — E1165 Type 2 diabetes mellitus with hyperglycemia: Secondary | ICD-10-CM | POA: Diagnosis present

## 2015-04-04 DIAGNOSIS — Z8249 Family history of ischemic heart disease and other diseases of the circulatory system: Secondary | ICD-10-CM | POA: Diagnosis not present

## 2015-04-04 DIAGNOSIS — N179 Acute kidney failure, unspecified: Secondary | ICD-10-CM | POA: Diagnosis present

## 2015-04-04 DIAGNOSIS — R0602 Shortness of breath: Secondary | ICD-10-CM

## 2015-04-04 DIAGNOSIS — J189 Pneumonia, unspecified organism: Secondary | ICD-10-CM | POA: Diagnosis present

## 2015-04-04 DIAGNOSIS — R042 Hemoptysis: Secondary | ICD-10-CM | POA: Diagnosis present

## 2015-04-04 DIAGNOSIS — K219 Gastro-esophageal reflux disease without esophagitis: Secondary | ICD-10-CM | POA: Diagnosis present

## 2015-04-04 DIAGNOSIS — Z833 Family history of diabetes mellitus: Secondary | ICD-10-CM | POA: Diagnosis not present

## 2015-04-04 DIAGNOSIS — E785 Hyperlipidemia, unspecified: Secondary | ICD-10-CM | POA: Diagnosis present

## 2015-04-04 DIAGNOSIS — E11649 Type 2 diabetes mellitus with hypoglycemia without coma: Secondary | ICD-10-CM | POA: Diagnosis present

## 2015-04-04 DIAGNOSIS — R911 Solitary pulmonary nodule: Secondary | ICD-10-CM | POA: Diagnosis present

## 2015-04-04 DIAGNOSIS — F17200 Nicotine dependence, unspecified, uncomplicated: Secondary | ICD-10-CM | POA: Diagnosis present

## 2015-04-04 DIAGNOSIS — J441 Chronic obstructive pulmonary disease with (acute) exacerbation: Secondary | ICD-10-CM | POA: Diagnosis present

## 2015-04-04 DIAGNOSIS — F418 Other specified anxiety disorders: Secondary | ICD-10-CM | POA: Diagnosis not present

## 2015-04-04 DIAGNOSIS — R519 Headache, unspecified: Secondary | ICD-10-CM

## 2015-04-04 DIAGNOSIS — J440 Chronic obstructive pulmonary disease with acute lower respiratory infection: Secondary | ICD-10-CM | POA: Diagnosis present

## 2015-04-04 DIAGNOSIS — B370 Candidal stomatitis: Secondary | ICD-10-CM | POA: Diagnosis not present

## 2015-04-04 DIAGNOSIS — Z8673 Personal history of transient ischemic attack (TIA), and cerebral infarction without residual deficits: Secondary | ICD-10-CM

## 2015-04-04 DIAGNOSIS — J181 Lobar pneumonia, unspecified organism: Secondary | ICD-10-CM

## 2015-04-04 DIAGNOSIS — J4540 Moderate persistent asthma, uncomplicated: Secondary | ICD-10-CM | POA: Diagnosis present

## 2015-04-04 DIAGNOSIS — Z794 Long term (current) use of insulin: Secondary | ICD-10-CM | POA: Diagnosis not present

## 2015-04-04 DIAGNOSIS — I10 Essential (primary) hypertension: Secondary | ICD-10-CM | POA: Diagnosis present

## 2015-04-04 DIAGNOSIS — T380X5A Adverse effect of glucocorticoids and synthetic analogues, initial encounter: Secondary | ICD-10-CM | POA: Diagnosis not present

## 2015-04-04 DIAGNOSIS — I2510 Atherosclerotic heart disease of native coronary artery without angina pectoris: Secondary | ICD-10-CM | POA: Diagnosis present

## 2015-04-04 DIAGNOSIS — R51 Headache: Secondary | ICD-10-CM

## 2015-04-04 DIAGNOSIS — R079 Chest pain, unspecified: Secondary | ICD-10-CM

## 2015-04-04 HISTORY — DX: Reserved for inherently not codable concepts without codable children: IMO0001

## 2015-04-04 HISTORY — DX: Chronic obstructive pulmonary disease, unspecified: J44.9

## 2015-04-04 LAB — GLUCOSE, RANDOM: GLUCOSE: 534 mg/dL — AB (ref 65–99)

## 2015-04-04 LAB — EXPECTORATED SPUTUM ASSESSMENT W GRAM STAIN, RFLX TO RESP C

## 2015-04-04 LAB — GLUCOSE, CAPILLARY
GLUCOSE-CAPILLARY: 415 mg/dL — AB (ref 65–99)
GLUCOSE-CAPILLARY: 429 mg/dL — AB (ref 65–99)
Glucose-Capillary: 385 mg/dL — ABNORMAL HIGH (ref 65–99)
Glucose-Capillary: 451 mg/dL — ABNORMAL HIGH (ref 65–99)
Glucose-Capillary: 463 mg/dL — ABNORMAL HIGH (ref 65–99)

## 2015-04-04 LAB — CBC
HCT: 29.4 % — ABNORMAL LOW (ref 35.0–47.0)
Hemoglobin: 9.1 g/dL — ABNORMAL LOW (ref 12.0–16.0)
MCH: 24.6 pg — ABNORMAL LOW (ref 26.0–34.0)
MCHC: 31.1 g/dL — ABNORMAL LOW (ref 32.0–36.0)
MCV: 79.1 fL — AB (ref 80.0–100.0)
PLATELETS: 231 10*3/uL (ref 150–440)
RBC: 3.72 MIL/uL — AB (ref 3.80–5.20)
RDW: 18.4 % — ABNORMAL HIGH (ref 11.5–14.5)
WBC: 11.1 10*3/uL — AB (ref 3.6–11.0)

## 2015-04-04 LAB — LACTIC ACID, PLASMA
Lactic Acid, Venous: 0.8 mmol/L (ref 0.5–2.0)
Lactic Acid, Venous: 1.5 mmol/L (ref 0.5–2.0)

## 2015-04-04 LAB — BASIC METABOLIC PANEL
ANION GAP: 10 (ref 5–15)
BUN: 33 mg/dL — AB (ref 6–20)
CO2: 22 mmol/L (ref 22–32)
Calcium: 8.4 mg/dL — ABNORMAL LOW (ref 8.9–10.3)
Chloride: 106 mmol/L (ref 101–111)
Creatinine, Ser: 2.06 mg/dL — ABNORMAL HIGH (ref 0.44–1.00)
GFR calc Af Amer: 29 mL/min — ABNORMAL LOW (ref >60)
GFR, EST NON AFRICAN AMERICAN: 25 mL/min — AB (ref >60)
GLUCOSE: 165 mg/dL — AB (ref 65–99)
POTASSIUM: 4.6 mmol/L (ref 3.5–5.1)
Sodium: 138 mmol/L (ref 135–145)

## 2015-04-04 LAB — TROPONIN I: TROPONIN I: 0.03 ng/mL (ref <0.031)

## 2015-04-04 LAB — PROTIME-INR
INR: 1.16
PROTHROMBIN TIME: 15 s (ref 11.4–15.0)

## 2015-04-04 LAB — BRAIN NATRIURETIC PEPTIDE: B Natriuretic Peptide: 121 pg/mL — ABNORMAL HIGH (ref 0.0–100.0)

## 2015-04-04 LAB — PROCALCITONIN: PROCALCITONIN: 0.17 ng/mL

## 2015-04-04 LAB — EXPECTORATED SPUTUM ASSESSMENT W REFEX TO RESP CULTURE

## 2015-04-04 MED ORDER — DEXTROSE 5 % IV SOLN
500.0000 mg | Freq: Once | INTRAVENOUS | Status: AC
  Administered 2015-04-04: 500 mg via INTRAVENOUS
  Filled 2015-04-04: qty 500

## 2015-04-04 MED ORDER — SODIUM CHLORIDE 0.9 % IV BOLUS (SEPSIS)
1000.0000 mL | Freq: Once | INTRAVENOUS | Status: AC
  Administered 2015-04-04: 1000 mL via INTRAVENOUS

## 2015-04-04 MED ORDER — METHYLPREDNISOLONE SODIUM SUCC 125 MG IJ SOLR
60.0000 mg | INTRAMUSCULAR | Status: DC
  Administered 2015-04-04 – 2015-04-06 (×3): 60 mg via INTRAVENOUS
  Filled 2015-04-04 (×3): qty 2

## 2015-04-04 MED ORDER — DEXTROSE 5 % IV SOLN
1.0000 g | Freq: Once | INTRAVENOUS | Status: AC
  Administered 2015-04-04: 1 g via INTRAVENOUS
  Filled 2015-04-04: qty 10

## 2015-04-04 MED ORDER — AMLODIPINE BESYLATE 10 MG PO TABS
10.0000 mg | ORAL_TABLET | Freq: Every day | ORAL | Status: DC
  Administered 2015-04-04 – 2015-04-15 (×12): 10 mg via ORAL
  Filled 2015-04-04 (×12): qty 1

## 2015-04-04 MED ORDER — INSULIN ASPART 100 UNIT/ML ~~LOC~~ SOLN
10.0000 [IU] | Freq: Once | SUBCUTANEOUS | Status: AC
  Administered 2015-04-04: 10 [IU] via SUBCUTANEOUS
  Filled 2015-04-04: qty 10

## 2015-04-04 MED ORDER — MORPHINE SULFATE (PF) 2 MG/ML IV SOLN
INTRAVENOUS | Status: AC
  Administered 2015-04-04: 2 mg via INTRAVENOUS
  Filled 2015-04-04: qty 1

## 2015-04-04 MED ORDER — METOPROLOL SUCCINATE ER 100 MG PO TB24
100.0000 mg | ORAL_TABLET | Freq: Every day | ORAL | Status: DC
  Administered 2015-04-04 – 2015-04-15 (×12): 100 mg via ORAL
  Filled 2015-04-04 (×12): qty 1

## 2015-04-04 MED ORDER — OXYCODONE HCL 5 MG PO TABS
5.0000 mg | ORAL_TABLET | ORAL | Status: DC | PRN
  Administered 2015-04-04 – 2015-04-14 (×14): 5 mg via ORAL
  Filled 2015-04-04 (×14): qty 1

## 2015-04-04 MED ORDER — IPRATROPIUM-ALBUTEROL 0.5-2.5 (3) MG/3ML IN SOLN
3.0000 mL | RESPIRATORY_TRACT | Status: DC | PRN
  Administered 2015-04-04 – 2015-04-05 (×4): 3 mL via RESPIRATORY_TRACT
  Filled 2015-04-04 (×4): qty 3

## 2015-04-04 MED ORDER — SODIUM CHLORIDE 0.9 % IV SOLN
INTRAVENOUS | Status: DC
  Administered 2015-04-04: 18:00:00 via INTRAVENOUS

## 2015-04-04 MED ORDER — ALBUTEROL SULFATE (2.5 MG/3ML) 0.083% IN NEBU
INHALATION_SOLUTION | RESPIRATORY_TRACT | Status: AC
  Administered 2015-04-04: 2.5 mg
  Filled 2015-04-04: qty 3

## 2015-04-04 MED ORDER — ATORVASTATIN CALCIUM 20 MG PO TABS
80.0000 mg | ORAL_TABLET | Freq: Every day | ORAL | Status: DC
  Administered 2015-04-04 – 2015-04-14 (×11): 80 mg via ORAL
  Filled 2015-04-04 (×11): qty 4

## 2015-04-04 MED ORDER — MORPHINE SULFATE (PF) 2 MG/ML IV SOLN
2.0000 mg | INTRAVENOUS | Status: DC | PRN
  Administered 2015-04-13: 2 mg via INTRAVENOUS
  Filled 2015-04-04: qty 1

## 2015-04-04 MED ORDER — INSULIN ASPART 100 UNIT/ML ~~LOC~~ SOLN
0.0000 [IU] | Freq: Every day | SUBCUTANEOUS | Status: DC
  Administered 2015-04-05: 4 [IU] via SUBCUTANEOUS
  Administered 2015-04-08: 5 [IU] via SUBCUTANEOUS
  Administered 2015-04-11: 2 [IU] via SUBCUTANEOUS
  Administered 2015-04-12: 3 [IU] via SUBCUTANEOUS
  Filled 2015-04-04: qty 5
  Filled 2015-04-04: qty 3
  Filled 2015-04-04: qty 4
  Filled 2015-04-04: qty 2

## 2015-04-04 MED ORDER — ALBUTEROL (5 MG/ML) CONTINUOUS INHALATION SOLN
10.0000 mg/h | INHALATION_SOLUTION | Freq: Once | RESPIRATORY_TRACT | Status: AC
  Administered 2015-04-04: 10 mg/h via RESPIRATORY_TRACT
  Filled 2015-04-04: qty 20

## 2015-04-04 MED ORDER — LOSARTAN POTASSIUM-HCTZ 100-25 MG PO TABS: 1.0000 | ORAL_TABLET | Freq: Every morning | ORAL | Status: DC

## 2015-04-04 MED ORDER — BUSPIRONE HCL 10 MG PO TABS
30.0000 mg | ORAL_TABLET | Freq: Two times a day (BID) | ORAL | Status: DC
  Administered 2015-04-04 – 2015-04-15 (×22): 30 mg via ORAL
  Filled 2015-04-04 (×3): qty 3
  Filled 2015-04-04: qty 2
  Filled 2015-04-04: qty 6
  Filled 2015-04-04: qty 2
  Filled 2015-04-04 (×3): qty 3
  Filled 2015-04-04: qty 6
  Filled 2015-04-04: qty 3
  Filled 2015-04-04: qty 6
  Filled 2015-04-04 (×4): qty 3
  Filled 2015-04-04: qty 6
  Filled 2015-04-04 (×4): qty 3
  Filled 2015-04-04: qty 6
  Filled 2015-04-04: qty 3

## 2015-04-04 MED ORDER — ACETAMINOPHEN 650 MG RE SUPP: 650.0000 mg | Freq: Four times a day (QID) | RECTAL | Status: DC | PRN

## 2015-04-04 MED ORDER — SERTRALINE HCL 100 MG PO TABS
100.0000 mg | ORAL_TABLET | Freq: Every day | ORAL | Status: DC
  Administered 2015-04-04 – 2015-04-15 (×12): 100 mg via ORAL
  Filled 2015-04-04 (×12): qty 1

## 2015-04-04 MED ORDER — HEPARIN SODIUM (PORCINE) 5000 UNIT/ML IJ SOLN
5000.0000 [IU] | Freq: Three times a day (TID) | INTRAMUSCULAR | Status: DC
  Administered 2015-04-04 – 2015-04-10 (×17): 5000 [IU] via SUBCUTANEOUS
  Filled 2015-04-04 (×17): qty 1

## 2015-04-04 MED ORDER — TRAZODONE HCL 100 MG PO TABS
200.0000 mg | ORAL_TABLET | Freq: Every day | ORAL | Status: DC
  Administered 2015-04-04 – 2015-04-14 (×11): 200 mg via ORAL
  Filled 2015-04-04 (×11): qty 2

## 2015-04-04 MED ORDER — ALBUTEROL SULFATE (2.5 MG/3ML) 0.083% IN NEBU
INHALATION_SOLUTION | RESPIRATORY_TRACT | Status: AC
  Administered 2015-04-04: 2.5 mg
  Filled 2015-04-04: qty 12

## 2015-04-04 MED ORDER — GABAPENTIN 300 MG PO CAPS
900.0000 mg | ORAL_CAPSULE | Freq: Every day | ORAL | Status: DC
  Administered 2015-04-04 – 2015-04-14 (×11): 900 mg via ORAL
  Filled 2015-04-04 (×11): qty 3

## 2015-04-04 MED ORDER — ALBUTEROL (5 MG/ML) CONTINUOUS INHALATION SOLN: 10.0000 mg/h | INHALATION_SOLUTION | Freq: Once | RESPIRATORY_TRACT | Status: DC

## 2015-04-04 MED ORDER — METHYLPREDNISOLONE SODIUM SUCC 125 MG IJ SOLR
125.0000 mg | Freq: Once | INTRAMUSCULAR | Status: AC
  Administered 2015-04-04: 125 mg via INTRAVENOUS
  Filled 2015-04-04: qty 2

## 2015-04-04 MED ORDER — ALBUTEROL (5 MG/ML) CONTINUOUS INHALATION SOLN
2.5000 mg/h | INHALATION_SOLUTION | Freq: Once | RESPIRATORY_TRACT | Status: DC
  Filled 2015-04-04: qty 20

## 2015-04-04 MED ORDER — ACETAMINOPHEN 325 MG PO TABS
650.0000 mg | ORAL_TABLET | Freq: Four times a day (QID) | ORAL | Status: DC | PRN
  Administered 2015-04-05 – 2015-04-06 (×2): 650 mg via ORAL
  Filled 2015-04-04 (×2): qty 2

## 2015-04-04 MED ORDER — MORPHINE SULFATE (PF) 2 MG/ML IV SOLN
2.0000 mg | Freq: Once | INTRAVENOUS | Status: AC
  Administered 2015-04-04: 2 mg via INTRAVENOUS

## 2015-04-04 MED ORDER — LOSARTAN POTASSIUM 50 MG PO TABS
100.0000 mg | ORAL_TABLET | Freq: Every day | ORAL | Status: DC
  Administered 2015-04-05 – 2015-04-15 (×11): 100 mg via ORAL
  Filled 2015-04-04 (×11): qty 2

## 2015-04-04 MED ORDER — INSULIN ASPART 100 UNIT/ML ~~LOC~~ SOLN
0.0000 [IU] | Freq: Three times a day (TID) | SUBCUTANEOUS | Status: DC
  Administered 2015-04-04 – 2015-04-05 (×3): 20 [IU] via SUBCUTANEOUS
  Administered 2015-04-06 – 2015-04-07 (×3): 7 [IU] via SUBCUTANEOUS
  Administered 2015-04-07: 15 [IU] via SUBCUTANEOUS
  Administered 2015-04-08: 11 [IU] via SUBCUTANEOUS
  Administered 2015-04-08: 3 [IU] via SUBCUTANEOUS
  Administered 2015-04-08 – 2015-04-09 (×2): 20 [IU] via SUBCUTANEOUS
  Administered 2015-04-09: 7 [IU] via SUBCUTANEOUS
  Administered 2015-04-09 – 2015-04-10 (×4): 20 [IU] via SUBCUTANEOUS
  Administered 2015-04-11: 11 [IU] via SUBCUTANEOUS
  Administered 2015-04-11 – 2015-04-12 (×3): 20 [IU] via SUBCUTANEOUS
  Administered 2015-04-12: 11 [IU] via SUBCUTANEOUS
  Administered 2015-04-12 – 2015-04-13 (×2): 7 [IU] via SUBCUTANEOUS
  Administered 2015-04-13 (×2): 11 [IU] via SUBCUTANEOUS
  Administered 2015-04-14: 20 [IU] via SUBCUTANEOUS
  Administered 2015-04-14: 4 [IU] via SUBCUTANEOUS
  Administered 2015-04-14: 11 [IU] via SUBCUTANEOUS
  Filled 2015-04-04: qty 15
  Filled 2015-04-04: qty 7
  Filled 2015-04-04: qty 20
  Filled 2015-04-04: qty 11
  Filled 2015-04-04: qty 7
  Filled 2015-04-04: qty 11
  Filled 2015-04-04: qty 20
  Filled 2015-04-04: qty 7
  Filled 2015-04-04: qty 20
  Filled 2015-04-04: qty 11
  Filled 2015-04-04 (×2): qty 20
  Filled 2015-04-04: qty 3
  Filled 2015-04-04: qty 11
  Filled 2015-04-04: qty 7
  Filled 2015-04-04: qty 11
  Filled 2015-04-04 (×5): qty 20
  Filled 2015-04-04: qty 11
  Filled 2015-04-04 (×2): qty 20
  Filled 2015-04-04: qty 4
  Filled 2015-04-04: qty 15
  Filled 2015-04-04: qty 7
  Filled 2015-04-04: qty 20

## 2015-04-04 MED ORDER — DEXTROSE 5 % IV SOLN
1.0000 g | INTRAVENOUS | Status: DC
  Administered 2015-04-05 – 2015-04-13 (×9): 1 g via INTRAVENOUS
  Filled 2015-04-04 (×10): qty 10

## 2015-04-04 MED ORDER — PANTOPRAZOLE SODIUM 40 MG PO TBEC
40.0000 mg | DELAYED_RELEASE_TABLET | Freq: Every day | ORAL | Status: DC
  Administered 2015-04-04 – 2015-04-15 (×12): 40 mg via ORAL
  Filled 2015-04-04 (×12): qty 1

## 2015-04-04 MED ORDER — ALBUTEROL SULFATE (2.5 MG/3ML) 0.083% IN NEBU
INHALATION_SOLUTION | RESPIRATORY_TRACT | Status: AC
  Administered 2015-04-04: 2.5 mg
  Filled 2015-04-04: qty 9

## 2015-04-04 MED ORDER — ONDANSETRON HCL 4 MG/2ML IJ SOLN: 4.0000 mg | Freq: Four times a day (QID) | INTRAMUSCULAR | Status: DC | PRN

## 2015-04-04 MED ORDER — ONDANSETRON HCL 4 MG PO TABS: 4.0000 mg | ORAL_TABLET | Freq: Four times a day (QID) | ORAL | Status: DC | PRN

## 2015-04-04 MED ORDER — INSULIN GLARGINE 100 UNIT/ML ~~LOC~~ SOLN
75.0000 [IU] | SUBCUTANEOUS | Status: DC
  Administered 2015-04-05: 75 [IU] via SUBCUTANEOUS
  Filled 2015-04-04: qty 0.75

## 2015-04-04 MED ORDER — CLOPIDOGREL BISULFATE 75 MG PO TABS
75.0000 mg | ORAL_TABLET | Freq: Every day | ORAL | Status: DC
  Administered 2015-04-04 – 2015-04-10 (×7): 75 mg via ORAL
  Filled 2015-04-04 (×7): qty 1

## 2015-04-04 MED ORDER — HYDROCHLOROTHIAZIDE 25 MG PO TABS
25.0000 mg | ORAL_TABLET | Freq: Every day | ORAL | Status: DC
  Administered 2015-04-05 – 2015-04-15 (×11): 25 mg via ORAL
  Filled 2015-04-04 (×11): qty 1

## 2015-04-04 MED ORDER — DEXTROSE 5 % IV SOLN
500.0000 mg | INTRAVENOUS | Status: DC
  Filled 2015-04-04 (×2): qty 500

## 2015-04-04 NOTE — H&P (Signed)
[facility] [place] NAME: Christine Sims    MR#:  [id number]  DATE OF BIRTH:  [birth year]   DATE OF ADMISSION:  04/04/2015  PRIMARY CARE PHYSICIAN: Philis Fendt, MD   REQUESTING/REFERRING PHYSICIAN: Reita Cliche  CHIEF COMPLAINT:   Chief Complaint  Patient presents with  . Respiratory Distress    HISTORY OF PRESENT ILLNESS:  Christine Sims  is a 60 y.o. female with a known history of moderate persistent asthma, insulin requiring diabetes who is presenting with shortness of breath. She describes breast before day duration of shortness of breath both with exertion and rest, associated cough productive yellowish sputum. However, denies fevers, chills on arrival to emergency department noted to be in respiratory distress requiring supplemental option to maintain O2 saturations greater than 92%.  PAST MEDICAL HISTORY:   Past Medical History  Diagnosis Date  . Asthma   . Diabetes mellitus without complication ([place])   . Stroke [facility])     R sided weakness  . MI (myocardial infarction) ([place])   . Arthritis   . Hypertension   . Hypercholesterolemia     PAST SURGICAL HISTORY:   Past Surgical History  Procedure Laterality Date  . Cardiac surgery    . Foot surgery      bilateral    SOCIAL HISTORY:   Social History  Substance Use Topics  . Smoking status: Current Every Day Smoker    Types: Cigarettes  . Smokeless tobacco: Not on file  . Alcohol Use: Yes    FAMILY HISTORY:   Family History  Problem Relation Age of Onset  . Diabetes Other   . Hypertension Other     DRUG ALLERGIES:   Allergies  Allergen Reactions  . Keppra [Levetiracetam]     Anaphylaxis   . Lisinopril     anaphylaxis    REVIEW OF SYSTEMS:  REVIEW OF SYSTEMS:  CONSTITUTIONAL: Denies fevers, chills, positive fatigue, weakness.  EYES: Denies blurred vision, double vision, or eye pain.  EARS, NOSE, THROAT: Denies tinnitus, ear pain, hearing  loss.  RESPIRATORY: Positive cough, shortness of breath, wheezing  CARDIOVASCULAR: Denies chest pain, palpitations, edema.  GASTROINTESTINAL: Denies nausea, vomiting, diarrhea, abdominal pain.  GENITOURINARY: Denies dysuria, hematuria.  ENDOCRINE: Denies nocturia or thyroid problems. HEMATOLOGIC AND LYMPHATIC: Denies easy bruising or bleeding.  SKIN: Denies rash or lesions.  MUSCULOSKELETAL: Denies pain in neck, back, shoulder, knees, hips, or further arthritic symptoms.  NEUROLOGIC: Denies paralysis, paresthesias.  PSYCHIATRIC: Denies anxiety or depressive symptoms. Otherwise full review of systems performed by me is negative.   MEDICATIONS AT HOME:   Prior to Admission medications   Medication Sig Start Date End Date Taking? Authorizing Provider  albuterol (PROVENTIL HFA;VENTOLIN HFA) 108 (90 BASE) MCG/ACT inhaler Inhale 2 puffs into the lungs every 4 (four) hours as needed for wheezing or shortness of breath.    Historical Provider, MD  albuterol-ipratropium (COMBIVENT) 18-103 MCG/ACT inhaler Inhale 2 puffs into the lungs every 4 (four) hours.    Historical Provider, MD  amLODipine (NORVASC) 10 MG tablet Take 10 mg by mouth daily.    Historical Provider, MD  atorvastatin (LIPITOR) 80 MG tablet Take 80 mg by mouth daily with supper.    Historical Provider, MD  busPIRone (BUSPAR) 30 MG tablet Take 30 mg by mouth 2 (two) times daily.    Historical Provider, MD  clopidogrel (PLAVIX) 75 MG tablet Take 75 mg by mouth daily.    Historical Provider, MD  dextrose (  GLUTOSE) 40 % gel Take 15 g by mouth once as needed (for low blood sugar).    Historical Provider, MD  doxycycline (VIBRAMYCIN) 100 MG capsule Take 1 capsule (100 mg total) by mouth 2 (two) times daily. 11/03/13   Fredia Sorrow, MD  gabapentin (NEURONTIN) 300 MG capsule Take 900 mg by mouth every evening.    Historical Provider, MD  insulin glargine (LANTUS) 100 UNIT/ML injection Inject 100 Units into the skin at bedtime.     Historical Provider, MD  insulin glargine (LANTUS) 100 UNIT/ML injection Inject 1 mL (100 Units total) into the skin at bedtime. 11/03/13   Fredia Sorrow, MD  insulin lispro (HUMALOG) 100 UNIT/ML injection Inject 30 Units into the skin 3 (three) times daily before meals.    Historical Provider, MD  insulin lispro (HUMALOG) 100 UNIT/ML injection Inject 0.3 mLs (30 Units total) into the skin 3 (three) times daily with meals. 11/03/13   Fredia Sorrow, MD  losartan-hydrochlorothiazide (HYZAAR) 100-25 MG per tablet Take 1 tablet by mouth every morning.    Historical Provider, MD  metoprolol succinate (TOPROL-XL) 100 MG 24 hr tablet Take 100 mg by mouth daily. Take with or immediately following a meal.    Historical Provider, MD  morphine (MS CONTIN) 15 MG 12 hr tablet Take 15 mg by mouth every 12 (twelve) hours as needed (for severe pain).    Historical Provider, MD  omeprazole (PRILOSEC) 40 MG capsule Take 40 mg by mouth daily.    Historical Provider, MD  sertraline (ZOLOFT) 100 MG tablet Take 100 mg by mouth daily.    Historical Provider, MD  traZODone (DESYREL) 100 MG tablet Take 200 mg by mouth at bedtime as needed for sleep.    Historical Provider, MD      VITAL SIGNS:  Blood pressure 125/98, pulse 99, resp. rate 28, height 5\' 4"  (1.626 m), weight 245 lb 12.8 oz (111.494 kg), SpO2 95 %.  PHYSICAL EXAMINATION:  VITAL SIGNS: Filed Vitals:   04/04/15 1159  BP: 125/98  Pulse: 99  Resp: 23   GENERAL:59 y.o.female currently in minimal acute distress given respiratory status.  HEAD: Normocephalic, atraumatic.  EYES: Pupils equal, round, reactive to light. Extraocular muscles intact. No scleral icterus.  MOUTH: Moist mucosal membrane. Dentition intact. No abscess noted.  EAR, NOSE, THROAT: Clear without exudates. No external lesions.  NECK: Supple. No thyromegaly. No nodules. No JVD.  PULMONARY: Diffuse expiratory wheezing throughout right lower lobe rhonchi tachypneic No use of accessory  muscles, Good respiratory effort. good air entry bilaterally CHEST: Nontender to palpation.  CARDIOVASCULAR: S1 and S2. Regular rate and rhythm. No murmurs, rubs, or gallops. No edema. Pedal pulses 2+ bilaterally.  GASTROINTESTINAL: Soft, nontender, nondistended. No masses. Positive bowel sounds. No hepatosplenomegaly.  MUSCULOSKELETAL: No swelling, clubbing, or edema. Range of motion full in all extremities.  NEUROLOGIC: Cranial nerves II through XII are intact. No gross focal neurological deficits. Sensation intact. Reflexes intact.  SKIN: No ulceration, lesions, rashes, or cyanosis. Skin warm and dry. Turgor intact.  PSYCHIATRIC: Mood, affect within normal limits. The patient is awake, alert and oriented x 3. Insight, judgment intact.    LABORATORY PANEL:   CBC  Recent Labs Lab 04/04/15 1220  WBC 11.1*  HGB 9.1*  HCT 29.4*  PLT 231   ------------------------------------------------------------------------------------------------------------------  Chemistries   Recent Labs Lab 04/04/15 1220  NA 138  K 4.6  CL 106  CO2 22  GLUCOSE 165*  BUN 33*  CREATININE 2.06*  CALCIUM 8.4*   ------------------------------------------------------------------------------------------------------------------  Cardiac Enzymes  Recent Labs Lab 04/04/15 1220  TROPONINI 0.03   ------------------------------------------------------------------------------------------------------------------  RADIOLOGY:  Ct Chest Wo Contrast  04/04/2015  CLINICAL DATA:  Shortness of breath and cough with hemoptysis EXAM: CT CHEST WITHOUT CONTRAST TECHNIQUE: Multidetector CT imaging of the chest was performed following the standard protocol without IV contrast. COMPARISON:  Chest x-ray from earlier in the same day FINDINGS: Lungs are well aerated bilaterally with the exception of the right lower lobe which demonstrates consolidation consistent with acute pneumonia. These changes are consistent with that  seen on the recent plain film examination. Thoracic inlet is within normal limits. Atherosclerotic calcifications of the aorta are seen as well as heavy coronary calcifications. No definitive central mass lesion is noted. Would be difficult to exclude some reactive lymphadenopathy based on the lack of administered contrast. The upper abdomen shows renal vascular calcifications. No other focal abnormality is noted. The osseous structures are within normal limits. IMPRESSION: Right lower lobe pneumonia. Followup PA and lateral chest X-ray is recommended in 3-4 weeks following trial of antibiotic therapy to ensure resolution and exclude underlying malignancy. Electronically Signed   By: Inez Catalina M.D.   On: 04/04/2015 14:33   Dg Chest Portable 1 View  04/04/2015  CLINICAL DATA:  Stridor and wheezing. EXAM: PORTABLE CHEST 1 VIEW COMPARISON:  None. FINDINGS: 1228 hours. Focal airspace disease is identified at the right lung base. Right hilar fullness is associated. Left lung is unremarkable. Cardiopericardial silhouette is at upper limits of normal for size. The visualized bony structures of the thorax are intact. IMPRESSION: Airspace disease at the right lung base base compatible with pneumonia, but there is fullness in the right hilar region and postobstructive consolidation could also produce this appearance. Close follow-up with repeat chest x-ray recommended to ensure complete resolution. If airspace disease does not clear or current clinical picture is not consistent with pneumonia, CT chest with contrast would be helpful to exclude a central obstructing mass lesion. Electronically Signed   By: Misty Stanley M.D.   On: 04/04/2015 12:51    EKG:   Orders placed or performed during the hospital encounter of 04/04/15  . ED EKG  . ED EKG  . EKG 12-Lead  . EKG 12-Lead    IMPRESSION AND PLAN:   60 year old African-American female history of type 2 diabetes insulin requiring as well as moderate  persistent asthma presenting with shortness of breath  1. Community acquired pneumonia with acute respiratory insufficiency: Supplemental oxygen keep SaO2 greater than 92%, DuoNeb treatments, Solu-Medrol, ceftriaxone/azithromycin, sputum culture blood culture, will check pro-calcitonin to help determine viral versus bacterial cause. 2. Type 2 diabetes insulin requiring: Continue basal insulin at insulin sliding scale hold oral agents 3. Hypertension essential: Norvasc 4. Hyperlipidemia unspecified Lipitor 5. Coronary artery disease without angina: Aspirin statin beta blocker 6. GERD without esophagitis: PPI therapy 7. Venous thromboembolism prophylactic: Heparin subcutaneous    All the records are reviewed and case discussed with ED provider. Management plans discussed with the patient, family and they are in agreement.  CODE STATUS: Full  TOTAL TIME TAKING CARE OF THIS PATIENT: 40 minutes.    Christine Sims,  Karenann Cai.D on 04/04/2015 at 3:24 PM  Between 7am to 6pm - Pager - [phone]  After 6pm: House Pager: - [facility]  Office  [phone]  CC: Primary care physician; Philis Fendt, MD

## 2015-04-04 NOTE — Progress Notes (Signed)
resp in to adm prn svn

## 2015-04-04 NOTE — ED Provider Notes (Signed)
[facility] Emergency Department Provider Note   ____________________________________________  Time seen: Approximately 12:10 PM I have reviewed the triage vital signs and the triage nursing note.  HISTORY  Chief Complaint Respiratory Distress   Historian Patient  HPI Christine Sims is a 60 y.o. female who gives a history of asthma, heart attack, and prior stroke, who has had increased trouble breathing and wheezing and central chest pain for about the past 4 days. She states she was afraid to come to the hospital. This morning things were somewhat acutely worse and she had some productive sputum with blood in it.Denies new lower extremity swelling or pleuritic chest pain. Denies fever. Denies nausea. No syncope. Chest pain central and goes up into the throat. Symptoms are moderate to severe. Walking around makes things worse.    Past Medical History  Diagnosis Date  . Asthma   . Diabetes mellitus without complication ([place])   . Stroke [facility])     R sided weakness  . MI (myocardial infarction) ([place])   . Arthritis   . Hypertension   . Hypercholesterolemia     There are no active problems to display for this patient.   Past Surgical History  Procedure Laterality Date  . Cardiac surgery    . Foot surgery      bilateral    Current Outpatient Rx  Name  Route  Sig  Dispense  Refill  . albuterol (PROVENTIL HFA;VENTOLIN HFA) 108 (90 BASE) MCG/ACT inhaler   Inhalation   Inhale 2 puffs into the lungs every 4 (four) hours as needed for wheezing or shortness of breath.         Marland Kitchen albuterol-ipratropium (COMBIVENT) 18-103 MCG/ACT inhaler   Inhalation   Inhale 2 puffs into the lungs every 4 (four) hours.         Marland Kitchen amLODipine (NORVASC) 10 MG tablet   Oral   Take 10 mg by mouth daily.         Marland Kitchen aspirin 325 MG tablet   Oral   Take 325 mg by mouth daily.         Marland Kitchen atorvastatin (LIPITOR) 80 MG tablet   Oral   Take 80 mg by mouth daily with  supper.         Marland Kitchen buPROPion (WELLBUTRIN XL) 150 MG 24 hr tablet   Oral   Take 150 mg by mouth every morning.         . busPIRone (BUSPAR) 30 MG tablet   Oral   Take 30 mg by mouth 2 (two) times daily.         . clopidogrel (PLAVIX) 75 MG tablet   Oral   Take 75 mg by mouth daily.         Marland Kitchen dextrose (GLUTOSE) 40 % gel   Oral   Take 15 g by mouth once as needed (for low blood sugar).         Marland Kitchen docusate sodium (COLACE) 100 MG capsule   Oral   Take 100 mg by mouth daily.         Marland Kitchen doxycycline (VIBRAMYCIN) 100 MG capsule   Oral   Take 1 capsule (100 mg total) by mouth 2 (two) times daily.   20 capsule   0   . gabapentin (NEURONTIN) 300 MG capsule   Oral   Take 900 mg by mouth every evening.         . insulin glargine (LANTUS) 100 UNIT/ML injection   Subcutaneous  Inject 100 Units into the skin at bedtime.         . insulin glargine (LANTUS) 100 UNIT/ML injection   Subcutaneous   Inject 1 mL (100 Units total) into the skin at bedtime.   10 mL   11   . insulin lispro (HUMALOG) 100 UNIT/ML injection   Subcutaneous   Inject 30 Units into the skin 3 (three) times daily before meals.         . insulin lispro (HUMALOG) 100 UNIT/ML injection   Subcutaneous   Inject 0.3 mLs (30 Units total) into the skin 3 (three) times daily with meals.   10 mL   11   . losartan-hydrochlorothiazide (HYZAAR) 100-25 MG per tablet   Oral   Take 1 tablet by mouth every morning.         . meloxicam (MOBIC) 15 MG tablet   Oral   Take 15 mg by mouth daily.         . metoprolol succinate (TOPROL-XL) 100 MG 24 hr tablet   Oral   Take 100 mg by mouth daily. Take with or immediately following a meal.         . morphine (MS CONTIN) 15 MG 12 hr tablet   Oral   Take 15 mg by mouth every 12 (twelve) hours as needed (for severe pain).         Marland Kitchen omeprazole (PRILOSEC) 40 MG capsule   Oral   Take 40 mg by mouth daily.         . sertraline (ZOLOFT) 100 MG tablet    Oral   Take 100 mg by mouth daily.         . traZODone (DESYREL) 100 MG tablet   Oral   Take 200 mg by mouth at bedtime as needed for sleep.           Allergies Keppra and Lisinopril  History reviewed. No pertinent family history.  Social History Social History  Substance Use Topics  . Smoking status: Current Every Day Smoker    Types: Cigarettes  . Smokeless tobacco: None  . Alcohol Use: Yes    Review of Systems  Constitutional: Negative for fever. Eyes: Negative for visual changes. ENT: Negative for sore throat. Cardiovascular: Positive for chest pain. Respiratory: Positive for shortness of breath. Gastrointestinal: Negative for abdominal pain, vomiting and diarrhea. Genitourinary: Negative for dysuria. Musculoskeletal: Negative for back pain. Skin: Negative for rash. Neurological: Positive for headache, mild and generalized. 10 point Review of Systems otherwise negative ____________________________________________   PHYSICAL EXAM:  VITAL SIGNS: ED Triage Vitals  Enc Vitals Group     BP 04/04/15 1159 125/98 mmHg     Pulse Rate 04/04/15 1159 99     Resp 04/04/15 1159 28     Temp --      Temp src --      SpO2 04/04/15 1159 96 %     Weight 04/04/15 1159 245 lb 12.8 oz (111.494 kg)     Height 04/04/15 1159 5\' 4"  (1.626 m)     Head Cir --      Peak Flow --      Pain Score --      Pain Loc --      Pain Edu? --      Excl. in [place]? --      Constitutional: Alert and oriented. Moderate respiratory distress with wheezing.. Eyes: Conjunctivae are normal. PERRL. Normal extraocular movements. ENT   Head: Normocephalic and atraumatic.  Nose: No congestion/rhinnorhea.   Mouth/Throat: Mucous membranes are moist.   Neck: No stridor. Cardiovascular/Chest: Normal rate, regular rhythm.  No murmurs, rubs, or gallops. Respiratory: Tachypnea with retractions. Wheezing throughout all lung fields is moderate. No rhonchi. No stridor. Gastrointestinal: Soft.  No distention, no guarding, no rebound. Nontender. Obese  Genitourinary/rectal:Deferred Musculoskeletal: Nontender with normal range of motion in all extremities. No joint effusions.  No lower extremity tenderness.  Trace edema bilateral lower extremities Neurologic:  Normal speech and language. No gross or focal neurologic deficits are appreciated. Skin:  Skin is warm, dry and intact. No rash noted. Psychiatric: Mood and affect are normal. Speech and behavior are normal. Patient exhibits appropriate insight and judgment.  ____________________________________________   EKG I, Lisa Roca, MD, the attending physician have personally viewed and interpreted all ECGs.  90 bpm. Normal sinus rhythm. Narrow QRS. Normal axis. Nonspecific ST and T-wave ____________________________________________  LABS (pertinent positives/negatives)  Lactate 0.8, INR [id number] Basic metabolic panel significant for BUN 33 and creatinine 2.06 White blood cell count 11.1, hemoglobin 9.1 and platelet count 231 Troponin 0.03 BNP 121  ____________________________________________  RADIOLOGY All Xrays were viewed by me. Imaging interpreted by Radiologist.  Chest portable:  IMPRESSION: Airspace disease at the right lung base base compatible with pneumonia, but there is fullness in the right hilar region and postobstructive consolidation could also produce this appearance. Close follow-up with repeat chest x-ray recommended to ensure complete resolution. If airspace disease does not clear or current clinical picture is not consistent with pneumonia, CT chest with contrast would be helpful to exclude a central obstructing mass lesion.  CT chest without contrast:IMPRESSION: Right lower lobe pneumonia. Followup PA and lateral chest X-ray is recommended in 3-4 weeks following trial of antibiotic therapy to ensure resolution and exclude underlying  malignancy. __________________________________________  PROCEDURES  Procedure(s) performed: None  Critical Care performed: CRITICAL CARE Performed by: Lisa Roca   Total critical care time: 60 minutes  Critical care time was exclusive of separately billable procedures and treating other patients.  Critical care was necessary to treat or prevent imminent or life-threatening deterioration.  Critical care was time spent personally by me on the following activities: development of treatment plan with patient and/or surrogate as well as nursing, discussions with consultants, evaluation of patient's response to treatment, examination of patient, obtaining history from patient or surrogate, ordering and performing treatments and interventions, ordering and review of laboratory studies, ordering and review of radiographic studies, pulse oximetry and re-evaluation of patient's condition.   ____________________________________________   ED COURSE / ASSESSMENT AND PLAN  Pertinent labs & imaging results that were available during my care of the patient were reviewed by me and considered in my medical decision making (see chart for details).   Patient arrived in significant respiratory distress. O2 sat stable on 2 L nasal cannula. Some clear without she had hypoxia from the EMS report. She did apparently receive epinephrine for complaint of stridor. She is not stridorous here, but does have significant wheezing. I am going to have her continue albuterol as a 10 mg continuous neb per hour. She'll be given Solu-Medrol.  Chest x-ray shows right lower lobe pneumonia. Patient will be given Rocephin and azithromycin for community-acquired pneumonia.  CT recommended to rule out underlying malignancy. Patient unable to take IV contrast due to acute renal failure with decreased GFR.  IV fluids 2 L normal saline bolus initiated. Full 30 cc/kg not given routinely due to history of MI and CHF. She is not  currently an acute CHF or pulmonary edema, but will be watched very closely with fluid status.     CONSULTATIONS:   Dr. Lavetta Nielsen, hospitalist for admission   Patient / Family / Caregiver informed of clinical course, medical decision-making process, and agree with plan.     ___________________________________________   FINAL CLINICAL IMPRESSION(S) / ED DIAGNOSES   Final diagnoses:  Shortness of breath  Chest pain, unspecified chest pain type  Acute renal failure, unspecified acute renal failure type ([place])  Right lower lobe pneumonia              Note: This dictation was prepared with Dragon dictation. Any transcriptional errors that result from this process are unintentional   Lisa Roca, MD 04/04/15 [phone]

## 2015-04-04 NOTE — Progress Notes (Signed)
Pt c/o SOB resp paged for prn svn,  Will be here shortly. Will cont to monitor

## 2015-04-04 NOTE — ED Notes (Signed)
Pt presents to the ED from home via EMS with stridor, wheezing. Pt had 2 duonebs, EMS gave 0.25 epinephrine upon arrival. Pt is alert and oriented

## 2015-04-05 LAB — BASIC METABOLIC PANEL
ANION GAP: 8 (ref 5–15)
BUN: 45 mg/dL — ABNORMAL HIGH (ref 6–20)
CALCIUM: 8.2 mg/dL — AB (ref 8.9–10.3)
CO2: 23 mmol/L (ref 22–32)
Chloride: 103 mmol/L (ref 101–111)
Creatinine, Ser: 1.84 mg/dL — ABNORMAL HIGH (ref 0.44–1.00)
GFR, EST AFRICAN AMERICAN: 34 mL/min — AB (ref >60)
GFR, EST NON AFRICAN AMERICAN: 29 mL/min — AB (ref >60)
Glucose, Bld: 468 mg/dL — ABNORMAL HIGH (ref 65–99)
Potassium: 4.2 mmol/L (ref 3.5–5.1)
SODIUM: 134 mmol/L — AB (ref 135–145)

## 2015-04-05 LAB — GLUCOSE, CAPILLARY
GLUCOSE-CAPILLARY: 438 mg/dL — AB (ref 65–99)
GLUCOSE-CAPILLARY: 445 mg/dL — AB (ref 65–99)
GLUCOSE-CAPILLARY: 399 mg/dL — AB (ref 65–99)
GLUCOSE-CAPILLARY: 390 mg/dL — AB (ref 65–99)
GLUCOSE-CAPILLARY: 319 mg/dL — AB (ref 65–99)
Glucose-Capillary: 403 mg/dL — ABNORMAL HIGH (ref 65–99)
Glucose-Capillary: 319 mg/dL — ABNORMAL HIGH (ref 65–99)

## 2015-04-05 LAB — CBC
HCT: 24.9 % — ABNORMAL LOW (ref 35.0–47.0)
HEMOGLOBIN: 8 g/dL — AB (ref 12.0–16.0)
MCH: 24.9 pg — ABNORMAL LOW (ref 26.0–34.0)
MCHC: 32 g/dL (ref 32.0–36.0)
MCV: 77.8 fL — ABNORMAL LOW (ref 80.0–100.0)
PLATELETS: 268 10*3/uL (ref 150–440)
RBC: 3.21 MIL/uL — AB (ref 3.80–5.20)
RDW: 18.2 % — ABNORMAL HIGH (ref 11.5–14.5)
WBC: 8.1 10*3/uL (ref 3.6–11.0)

## 2015-04-05 LAB — HEMOGLOBIN A1C: HEMOGLOBIN A1C: 10.3 % — AB (ref 4.0–6.0)

## 2015-04-05 MED ORDER — INSULIN GLARGINE 100 UNIT/ML ~~LOC~~ SOLN
75.0000 [IU] | Freq: Two times a day (BID) | SUBCUTANEOUS | Status: DC
  Administered 2015-04-05 – 2015-04-09 (×8): 75 [IU] via SUBCUTANEOUS
  Filled 2015-04-05 (×11): qty 0.75

## 2015-04-05 MED ORDER — HYDROCOD POLST-CPM POLST ER 10-8 MG/5ML PO SUER
5.0000 mL | Freq: Two times a day (BID) | ORAL | Status: DC | PRN
  Administered 2015-04-05 – 2015-04-14 (×16): 5 mL via ORAL
  Filled 2015-04-05 (×18): qty 5

## 2015-04-05 MED ORDER — INSULIN ASPART 100 UNIT/ML ~~LOC~~ SOLN
20.0000 [IU] | Freq: Three times a day (TID) | SUBCUTANEOUS | Status: DC
  Administered 2015-04-06 – 2015-04-09 (×7): 20 [IU] via SUBCUTANEOUS
  Filled 2015-04-05 (×5): qty 20
  Filled 2015-04-05: qty 1
  Filled 2015-04-05: qty 20

## 2015-04-05 MED ORDER — CALCIUM CARBONATE ANTACID 500 MG PO CHEW
1.0000 | CHEWABLE_TABLET | ORAL | Status: DC | PRN
  Administered 2015-04-05: 400 mg via ORAL
  Filled 2015-04-05: qty 2

## 2015-04-05 MED ORDER — IPRATROPIUM-ALBUTEROL 0.5-2.5 (3) MG/3ML IN SOLN
3.0000 mL | RESPIRATORY_TRACT | Status: DC
  Administered 2015-04-05 – 2015-04-13 (×47): 3 mL via RESPIRATORY_TRACT
  Filled 2015-04-05 (×48): qty 3

## 2015-04-05 MED ORDER — INSULIN REGULAR HUMAN 100 UNIT/ML IJ SOLN
20.0000 [IU] | Freq: Once | INTRAMUSCULAR | Status: DC
  Filled 2015-04-05 (×3): qty 0.2

## 2015-04-05 MED ORDER — BUDESONIDE 0.25 MG/2ML IN SUSP
0.2500 mg | Freq: Two times a day (BID) | RESPIRATORY_TRACT | Status: DC
  Administered 2015-04-05 – 2015-04-15 (×20): 0.25 mg via RESPIRATORY_TRACT
  Filled 2015-04-05 (×21): qty 2

## 2015-04-05 MED ORDER — INSULIN ASPART 100 UNIT/ML ~~LOC~~ SOLN
20.0000 [IU] | Freq: Once | SUBCUTANEOUS | Status: AC
  Administered 2015-04-05: 20 [IU] via SUBCUTANEOUS
  Filled 2015-04-05: qty 20

## 2015-04-05 NOTE — Progress Notes (Addendum)
Pt fell while attempting to use BSC with assistance. No injuries/pain reported by patient, denies pain. Per NT in room at time of incident, pt began coughing when standing up from sitting position and lost conciousness. Pt leaned forward then sat down on floor during episode. VS taken, no obvious injuries seen. Notified charge Company secretary. MD notified. Fall huddle completed.

## 2015-04-05 NOTE — Care Management Note (Signed)
Case Management Note  Patient Details  Name: Christine Sims MRN: [id number] Date of Birth: 06/20/1955  Subjective/Objective:                  Met with patient to discuss discharge planning. She is currently living at address [place] [address] [phone] but states her "aide moves her around to different addresses and will soon move again". Patient is not happy with aide- Evonnie Pat. Said she was "good at first and now she don't do nothing". Patient has not talked to her social worker at [facility] about this concern and asked that I do not talk to her about her complaint. I advised patient to talk to her Education officer, museum. Her PCP is with [facility]. She lost her glucometer during last move to above address. She is not on O2 at home. She was just approved for ACT[employer]/public transportation through [place]. She states she does have a nebulizer that works at home.    Action/Plan:  RNCM will continue to follow for home O2. I have requested glucometer Rx from MD.    Expected Discharge Date:                  Expected Discharge Plan:     In-House Referral:     Discharge planning Services  CM Consult  Post Acute Care Choice:  Durable Medical Equipment Choice offered to:  Patient  DME Arranged:    DME Agency:     HH Arranged:    [place] Agency:     Status of Service:  In process, will continue to follow  Medicare Important Message Given:    Date Medicare IM Given:    Medicare IM give by:    Date Additional Medicare IM Given:    Additional Medicare Important Message give by:     If discussed at [place] of Stay Meetings, dates discussed:    Additional Comments:  [name], RN 04/05/2015, 9:25 AM

## 2015-04-05 NOTE — Progress Notes (Signed)
[place] at [place] NAME: Christine Sims    MR#:  [id number]  DATE OF BIRTH:  10-23-1955  SUBJECTIVE:   Pt. Here due to shortness of breath, wheezing and noted to be in COPD Exacerbation w/ pneumonia.  Having coughing spells this morning and had post-tussive syncope today twice as witnessed by nursing staff. Pt. Claims that she has been having these post-tussive syncope at home too.    REVIEW OF SYSTEMS:    Review of Systems  Constitutional: Negative for fever and chills.  HENT: Negative for congestion and tinnitus.   Eyes: Negative for blurred vision and double vision.  Respiratory: Positive for cough, shortness of breath and wheezing.   Cardiovascular: Negative for chest pain, orthopnea and PND.  Gastrointestinal: Negative for nausea, vomiting, abdominal pain and diarrhea.  Genitourinary: Negative for dysuria and hematuria.  Neurological: Negative for dizziness, sensory change and focal weakness.  All other systems reviewed and are negative.   Nutrition: heart Healthy Tolerating Diet: Yes Tolerating PT: Await Eval.   DRUG ALLERGIES:   Allergies  Allergen Reactions  . Keppra [Levetiracetam]     Anaphylaxis   . Lisinopril     anaphylaxis    VITALS:  Blood pressure 128/69, pulse 85, temperature 98.1 F (36.7 C), temperature source Oral, resp. rate 17, height 5\' 4"  (1.626 m), weight 126.735 kg (279 lb 6.4 oz), SpO2 96 %.  PHYSICAL EXAMINATION:   Physical Exam  GENERAL:  60 y.o.-year-old obese patient sitting up in the bed in mild resp. Distress.  EYES: Pupils equal, round, reactive to light and accommodation. No scleral icterus. Extraocular muscles intact.  HEENT: Head atraumatic, normocephalic. Oropharynx and nasopharynx clear.  NECK:  Supple, no jugular venous distention. No thyroid enlargement, no tenderness.  LUNGS: Good air entry bilaterally. Positive wheezing, rhonchi diffusely bilaterally. + use of accessory  muscles.   CARDIOVASCULAR: S1, S2 normal. No murmurs, rubs, or gallops.  ABDOMEN: Soft, nontender, nondistended. Bowel sounds present. No organomegaly or mass.  EXTREMITIES: No cyanosis, clubbing or edema b/l.    NEUROLOGIC: Cranial nerves II through XII are intact. No focal Motor or sensory deficits b/l.   PSYCHIATRIC: The patient is alert and oriented x 3. Good affect.  SKIN: No obvious rash, lesion, or ulcer.    LABORATORY PANEL:   CBC  Recent Labs Lab 04/05/15 0336  WBC 8.1  HGB 8.0*  HCT 24.9*  PLT 268   ------------------------------------------------------------------------------------------------------------------  Chemistries   Recent Labs Lab 04/05/15 0336  NA 134*  K 4.2  CL 103  CO2 23  GLUCOSE 468*  BUN 45*  CREATININE 1.84*  CALCIUM 8.2*   ------------------------------------------------------------------------------------------------------------------  Cardiac Enzymes  Recent Labs Lab 04/04/15 1220  TROPONINI 0.03   ------------------------------------------------------------------------------------------------------------------  RADIOLOGY:  Ct Chest Wo Contrast  04/04/2015  CLINICAL DATA:  Shortness of breath and cough with hemoptysis EXAM: CT CHEST WITHOUT CONTRAST TECHNIQUE: Multidetector CT imaging of the chest was performed following the standard protocol without IV contrast. COMPARISON:  Chest x-ray from earlier in the same day FINDINGS: Lungs are well aerated bilaterally with the exception of the right lower lobe which demonstrates consolidation consistent with acute pneumonia. These changes are consistent with that seen on the recent plain film examination. Thoracic inlet is within normal limits. Atherosclerotic calcifications of the aorta are seen as well as heavy coronary calcifications. No definitive central mass lesion is noted. Would be difficult to exclude some reactive lymphadenopathy based on the lack of administered contrast. The  upper  abdomen shows renal vascular calcifications. No other focal abnormality is noted. The osseous structures are within normal limits. IMPRESSION: Right lower lobe pneumonia. Followup PA and lateral chest X-ray is recommended in 3-4 weeks following trial of antibiotic therapy to ensure resolution and exclude underlying malignancy. Electronically Signed   By: Inez Catalina M.D.   On: 04/04/2015 14:33   Dg Chest Portable 1 View  04/04/2015  CLINICAL DATA:  Stridor and wheezing. EXAM: PORTABLE CHEST 1 VIEW COMPARISON:  None. FINDINGS: 1228 hours. Focal airspace disease is identified at the right lung base. Right hilar fullness is associated. Left lung is unremarkable. Cardiopericardial silhouette is at upper limits of normal for size. The visualized bony structures of the thorax are intact. IMPRESSION: Airspace disease at the right lung base base compatible with pneumonia, but there is fullness in the right hilar region and postobstructive consolidation could also produce this appearance. Close follow-up with repeat chest x-ray recommended to ensure complete resolution. If airspace disease does not clear or current clinical picture is not consistent with pneumonia, CT chest with contrast would be helpful to exclude a central obstructing mass lesion. Electronically Signed   By: Misty Stanley M.D.   On: 04/04/2015 12:51     ASSESSMENT AND PLAN:   60 yo female w/ hx of COPD w/ ongoing tobacco abuse, Diabetes, hx of previous CVA, hx of previous MI, HTN, Hyperlipidemia who presented to the hospital w/ shortness of breath due to COPD Exacerbation.    1. COPD Exacerbation - due to pneumonia and ongoing tobacco abuse.  - cont. IV steroids, ATC duonebs, Pulm. Nebs.  - cont. IV Ceftriaxone, Zithromax and follow clinically.   2. Pneumonia - RLL. CAP.  - cont. IV Ceftriaxone, Zithromax and follow cultures.   3. DM Type II w/out complication - BS a bit elevated due to steroids.  - cont. Lantus 75 BID, will add novolog  with meals and cont. SSI and follow BS  4. Hx of previous CVA - cont. Plavix, STatin.   5. HTN - cont. Metoprolol, HCTZ/Losartan, NOrvasc.   6. Anxiety/Depression - cont. Buspar, Zoloft, Trazodone.   7. Syncope-likely tussive syncope as it usually happens after her coughing paroxysms -We will observe on telemetry and watch for any arrhythmias. If continues to have recurrent episodes we'll get a carotid duplex, echo and further workup if needed.  All the records are reviewed and case discussed with Care Management/Social Workerr. Management plans discussed with the patient, family and they are in agreement.  CODE STATUS: Full  DVT Prophylaxis: Heparin subcutaneous  TOTAL TIME TAKING CARE OF THIS PATIENT: 30 minutes.   POSSIBLE D/C IN 2-3 DAYS, DEPENDING ON CLINICAL CONDITION.   [name] M.D on 04/05/2015 at 1:44 PM  Between 7am to 6pm - Pager - [phone]  After 6pm go to [email or web address] - password [email or web address] [facility]  [place] [facility]  Office  [phone]  CC: Primary care physician; Philis Fendt, MD

## 2015-04-05 NOTE — Progress Notes (Addendum)
Pt observed choking on food, loss of conciousness for 1-2 seconds, eyes rolled back in head. Nurse repositioned pt upright, pt was able to clear airway by coughing. Notified MD, new orders to follow. Initiated TELE monitoring.

## 2015-04-05 NOTE — Progress Notes (Addendum)
Inpatient Diabetes Program Recommendations  AACE/ADA: New Consensus Statement on Inpatient Glycemic Control (2015)  Target Ranges:  Prepandial:   less than 140 mg/dL      Peak postprandial:   less than 180 mg/dL (1-2 hours)      Critically ill patients:  140 - 180 mg/dL   Review of Glycemic Control  Results for ACADIA, PAHLS (MRN [id number]) as of 04/05/2015 09:08  Ref. Range 04/04/2015 23:07 04/05/2015 01:24 04/05/2015 01:25 04/05/2015 05:19 04/05/2015 07:28  Glucose-Capillary Latest Ref Range: 65-99 mg/dL 451 (H) 438 (H) 403 (H) 445 (H) 399 (H)    Diabetes history: Type 2 A1C 10.3% Outpatient Diabetes medications: Notes dated 02/03/15 state Lantus 60 units qam and Humalog 30 units tid.  Patient states she takes Lantus 75 units bid, Humalog 30 units tid with meals.  Current orders for Inpatient glycemic control: Lantus 75 units qam, Novolog 0-20 units tid, Novolog 0-5 units qhs * patient on steroids  Inpatient Diabetes Program Recommendations: Consider starting the patient on 20 units Novolog insulin tid with meals- continue Novolog resistant correction as ordered.   Please change diet to heart healthy/ carb modified.   Patient will need an order for a glucometer, testing strips and lancets on discharge.   Gentry Fitz, RN, BA, MHA, CDE Diabetes Coordinator Inpatient Diabetes Program  [phone] (Team Pager) [phone] ([place]) 04/05/2015 9:12 AM

## 2015-04-05 NOTE — Progress Notes (Signed)
Inpatient Diabetes Program Recommendations  AACE/ADA: New Consensus Statement on Inpatient Glycemic Control (2015)  Target Ranges:  Prepandial:   less than 140 mg/dL      Peak postprandial:   less than 180 mg/dL (1-2 hours)      Critically ill patients:  140 - 180 mg/dL   Review of Glycemic Control  Results for Christine Sims, Christine Sims (MRN [id number]) as of 04/05/2015 13:55  Ref. Range 04/05/2015 01:24 04/05/2015 01:25 04/05/2015 05:19 04/05/2015 07:28 04/05/2015 11:13  Glucose-Capillary Latest Ref Range: 65-99 mg/dL 438 (H) 403 (H) 445 (H) 399 (H) 390 (H)    Consider increasing Lantus :  add Lantus 30 units qhs (in addition to Lantus 75 units qam) See previous note dated today for increase in mealtime insulin- patient confirms with me that she takes Lantus 75 units bid, Humalog 30 units tid with meals .  A1C 10.3%.   Gentry Fitz, RN, BA, MHA, CDE Diabetes Coordinator Inpatient Diabetes Program  [phone] (Team Pager) [phone] ([place]) 04/05/2015 1:57 PM

## 2015-04-05 NOTE — Progress Notes (Signed)
Dyspnea & wheezing improving

## 2015-04-06 LAB — HEMOGLOBIN: Hemoglobin: 8.3 g/dL — ABNORMAL LOW (ref 12.0–16.0)

## 2015-04-06 LAB — BASIC METABOLIC PANEL
ANION GAP: 8 (ref 5–15)
BUN: 44 mg/dL — AB (ref 6–20)
CALCIUM: 8.8 mg/dL — AB (ref 8.9–10.3)
CO2: 24 mmol/L (ref 22–32)
Chloride: 108 mmol/L (ref 101–111)
Creatinine, Ser: 1.52 mg/dL — ABNORMAL HIGH (ref 0.44–1.00)
GFR calc Af Amer: 43 mL/min — ABNORMAL LOW (ref >60)
GFR calc non Af Amer: 37 mL/min — ABNORMAL LOW (ref >60)
GLUCOSE: 259 mg/dL — AB (ref 65–99)
Potassium: 4 mmol/L (ref 3.5–5.1)
Sodium: 140 mmol/L (ref 135–145)

## 2015-04-06 LAB — GLUCOSE, CAPILLARY
GLUCOSE-CAPILLARY: 237 mg/dL — AB (ref 65–99)
GLUCOSE-CAPILLARY: 126 mg/dL — AB (ref 65–99)
GLUCOSE-CAPILLARY: 45 mg/dL — AB (ref 65–99)
Glucose-Capillary: 233 mg/dL — ABNORMAL HIGH (ref 65–99)
Glucose-Capillary: 170 mg/dL — ABNORMAL HIGH (ref 65–99)

## 2015-04-06 LAB — PROCALCITONIN: Procalcitonin: 0.16 ng/mL

## 2015-04-06 MED ORDER — AZITHROMYCIN 250 MG PO TABS
500.0000 mg | ORAL_TABLET | Freq: Every day | ORAL | Status: DC
  Administered 2015-04-06 – 2015-04-07 (×2): 500 mg via ORAL
  Filled 2015-04-06 (×2): qty 2

## 2015-04-06 NOTE — Progress Notes (Signed)
Inpatient Diabetes Program Recommendations  AACE/ADA: New Consensus Statement on Inpatient Glycemic Control (2015)  Target Ranges:  Prepandial:   less than 140 mg/dL      Peak postprandial:   less than 180 mg/dL (1-2 hours)      Critically ill patients:  140 - 180 mg/dL  Results for Christine Sims, Christine Sims (MRN [id number]) as of 04/06/2015 09:26  Ref. Range 04/05/2015 07:28 04/05/2015 11:13 04/05/2015 15:06 04/05/2015 20:49 04/06/2015 08:06  Glucose-Capillary Latest Ref Range: 65-99 mg/dL 399 (H) 390 (H) 319 (H) 319 (H) 233 (H)   Review of Glycemic Control  Diabetes history: DM2 Outpatient Diabetes medications: Levemir 75 units BID, Humalog 30 units TID with meals Current orders for Inpatient glycemic control: Levemir 75 units BID, Novolog 20 units TID with meals for meal coverage, Novolog 0-20 units TID with meals, Novolog 0-5 units QHS  Inpatient Diabetes Program Recommendations: Insulin - Meal Coverage: If steroids are continued, please consider increasing meal coverage to Novolog 25 units TID with meals if patient eats at least 50% of meal.  Thanks, Barnie Alderman, RN, MSN, CDE Diabetes Coordinator Inpatient Diabetes Program [phone] (Team Pager from [place] to [place]) [phone] ([place] office) [phone] [facility] office) [phone] [facility] office)

## 2015-04-06 NOTE — Progress Notes (Signed)
PHARMACIST - PHYSICIAN COMMUNICATION DR:   Verdell Carmine CONCERNING: Antibiotic IV to Oral Route Change Policy  RECOMMENDATION: This patient is receiving Azithromycin by the intravenous route.  Based on criteria approved by the Pharmacy and Therapeutics Committee, the antibiotic(s) is/are being converted to the equivalent oral dose form(s).   DESCRIPTION: These criteria include:  Patient being treated for a respiratory tract infection, urinary tract infection, cellulitis or clostridium difficile associated diarrhea if on metronidazole  The patient is not neutropenic and does not exhibit a GI malabsorption state  The patient is eating (either orally or via tube) and/or has been taking other orally administered medications for a least 24 hours  The patient is improving clinically and has a Tmax < 100.5  If you have questions about this conversion, please contact the Pharmacy Department  []   [phone] )  [facility] [x]   [phone] )  [facility] []   [phone] )  [facility] []   [phone] )  [facility] []   [phone] )  [facility]    Larene [place], PharmD

## 2015-04-06 NOTE — Progress Notes (Signed)
[place] at [place] NAME: Christine Sims    MR#:  [id number]  DATE OF BIRTH:  [birth year]  SUBJECTIVE:   Pt. Here due to shortness of breath, wheezing and noted to be in COPD Exacerbation w/ pneumonia.  No further episodes of post-tussive syncope.  Still having significant wheezing/bronchospasm.     REVIEW OF SYSTEMS:    Review of Systems  Constitutional: Negative for fever and chills.  HENT: Negative for congestion and tinnitus.   Eyes: Negative for blurred vision and double vision.  Respiratory: Positive for cough, shortness of breath and wheezing.   Cardiovascular: Negative for chest pain, orthopnea and PND.  Gastrointestinal: Negative for nausea, vomiting, abdominal pain and diarrhea.  Genitourinary: Negative for dysuria and hematuria.  Neurological: Negative for dizziness, sensory change and focal weakness.  All other systems reviewed and are negative.   Nutrition: heart Healthy Tolerating Diet: Yes Tolerating PT: Await Eval.   DRUG ALLERGIES:   Allergies  Allergen Reactions  . Keppra [Levetiracetam]     Anaphylaxis   . Lisinopril     anaphylaxis    VITALS:  Blood pressure 123/70, pulse 77, temperature 97.8 F (36.6 C), temperature source Oral, resp. rate 18, height 5\' 4"  (1.626 m), weight 126.735 kg (279 lb 6.4 oz), SpO2 93 %.  PHYSICAL EXAMINATION:   Physical Exam  GENERAL:  60 y.o.-year-old obese patient lying in the bed in mild resp. Distress.  EYES: Pupils equal, round, reactive to light and accommodation. No scleral icterus. Extraocular muscles intact.  HEENT: Head atraumatic, normocephalic. Oropharynx and nasopharynx clear.  NECK:  Supple, no jugular venous distention. No thyroid enlargement, no tenderness.  LUNGS: Good air entry bilaterally. Positive wheezing, rhonchi diffusely bilaterally. + use of accessory muscles.   CARDIOVASCULAR: S1, S2 normal. No murmurs, rubs, or gallops.  ABDOMEN: Soft,  nontender, nondistended. Bowel sounds present. No organomegaly or mass.  EXTREMITIES: No cyanosis, clubbing or edema b/l.    NEUROLOGIC: Cranial nerves II through XII are intact. No focal Motor or sensory deficits b/l.   PSYCHIATRIC: The patient is alert and oriented x 3. Good affect.  SKIN: No obvious rash, lesion, or ulcer.    LABORATORY PANEL:   CBC  Recent Labs Lab 04/05/15 0336 04/06/15 1338  WBC 8.1  --   HGB 8.0* 8.3*  HCT 24.9*  --   PLT 268  --    ------------------------------------------------------------------------------------------------------------------  Chemistries   Recent Labs Lab 04/06/15 0437  NA 140  K 4.0  CL 108  CO2 24  GLUCOSE 259*  BUN 44*  CREATININE 1.52*  CALCIUM 8.8*   ------------------------------------------------------------------------------------------------------------------  Cardiac Enzymes  Recent Labs Lab 04/04/15 1220  TROPONINI 0.03   ------------------------------------------------------------------------------------------------------------------  RADIOLOGY:  No results found.   ASSESSMENT AND PLAN:   60 yo female w/ hx of COPD w/ ongoing tobacco abuse, Diabetes, hx of previous CVA, hx of previous MI, HTN, Hyperlipidemia who presented to the hospital w/ shortness of breath due to COPD Exacerbation.    1. COPD Exacerbation - due to pneumonia and ongoing tobacco abuse. Slow to improve.   - cont. IV steroids, ATC duonebs, Pulm. Nebs.  - cont. IV Ceftriaxone, Zithromax  - assess for Home O2 prior to discharge.   2. Pneumonia - RLL. CAP.  - cont. IV Ceftriaxone, Zithromax.   - follow blood and sputum Cx which are (-) so far.    3. DM Type II w/out complication - BS a bit elevated due to steroids.  -  cont. Lantus, novolog with meals, SSI  4. Hx of previous CVA - cont. Plavix, STatin.   5. HTN - cont. Metoprolol, HCTZ/Losartan, NOrvasc.   6. Anxiety/Depression - cont. Buspar, Zoloft, Trazodone.   7.  Syncope-likely tussive syncope as it usually happens after her coughing paroxysms - no alarms on tele overnight. NO further witnessed episodes since yesterday.   -will d/c tele tomorrow if remains stable.   All the records are reviewed and case discussed with Care Management/Social Workerr. Management plans discussed with the patient, family and they are in agreement.  CODE STATUS: Full  DVT Prophylaxis: Heparin subcutaneous  TOTAL TIME TAKING CARE OF THIS PATIENT: 25 minutes.   POSSIBLE D/C IN 2-3 DAYS, DEPENDING ON CLINICAL CONDITION.   [name] M.D on 04/06/2015 at 3:11 PM  Between 7am to 6pm - Pager - [phone]  After 6pm go to [email or web address] - password [email or web address] [facility]  [place] [facility]  Office  [phone]  CC: Primary care physician; Philis Fendt, MD

## 2015-04-06 NOTE — Care Management (Deleted)
Patient is known to this RNCM as I have worked with him in the past for PCP ([facility] and [facility]). He states he has gone to appointments and sought assistance through Med mgt without difficulty. I have emailed Marton Redwood with [facility] to see when he was seen and if they can follow up after this hospitalization. He currently denies RNCM needs.

## 2015-04-07 LAB — URINE DRUG SCREEN, QUALITATIVE (ARMC ONLY)
AMPHETAMINES, UR SCREEN: NOT DETECTED
Barbiturates, Ur Screen: NOT DETECTED
Benzodiazepine, Ur Scrn: NOT DETECTED
CANNABINOID 50 NG, UR ~~LOC~~: NOT DETECTED
Cocaine Metabolite,Ur ~~LOC~~: NOT DETECTED
MDMA (ECSTASY) UR SCREEN: NOT DETECTED
Methadone Scn, Ur: NOT DETECTED
OPIATE, UR SCREEN: POSITIVE — AB
PHENCYCLIDINE (PCP) UR S: NOT DETECTED
Tricyclic, Ur Screen: NOT DETECTED

## 2015-04-07 LAB — GLUCOSE, CAPILLARY
Glucose-Capillary: 239 mg/dL — ABNORMAL HIGH (ref 65–99)
Glucose-Capillary: 63 mg/dL — ABNORMAL LOW (ref 65–99)
Glucose-Capillary: 86 mg/dL (ref 65–99)
Glucose-Capillary: 154 mg/dL — ABNORMAL HIGH (ref 65–99)

## 2015-04-07 LAB — CULTURE, RESPIRATORY: CULTURE: NORMAL

## 2015-04-07 LAB — CULTURE, RESPIRATORY W GRAM STAIN

## 2015-04-07 MED ORDER — METHYLPREDNISOLONE SODIUM SUCC 125 MG IJ SOLR
60.0000 mg | Freq: Four times a day (QID) | INTRAMUSCULAR | Status: DC
  Administered 2015-04-07 – 2015-04-09 (×8): 60 mg via INTRAVENOUS
  Filled 2015-04-07 (×8): qty 2

## 2015-04-07 NOTE — Progress Notes (Signed)
Inpatient Diabetes Program Recommendations  AACE/ADA: New Consensus Statement on Inpatient Glycemic Control (2015)  Target Ranges:  Prepandial:   less than 140 mg/dL      Peak postprandial:   less than 180 mg/dL (1-2 hours)      Critically ill patients:  140 - 180 mg/dL  Results for ABEGAYLE, Christine Sims (MRN [id number]) as of 04/07/2015 08:44  Ref. Range 04/06/2015 08:06 04/06/2015 11:08 04/06/2015 16:32 04/06/2015 17:18 04/06/2015 20:51  Glucose-Capillary Latest Ref Range: 65-99 mg/dL 233 (H) 237 (H) 45 (L) 126 (H) 170 (H)   Review of Glycemic Control  Diabetes history: DM2 Outpatient Diabetes medications: Levemir 75 units BID, Humalog 30 units TID with meals Current orders for Inpatient glycemic control: Levemir 75 units BID, Novolog 20 units TID with meals for meal coverage, Novolog 0-20 units TID with meals, Novolog 0-5 units QHS  Inpatient Diabetes Program Recommendations: Insulin:  NURSING: Please be sure patient is eating at least 50% of meal with meal coverage, please chart percentage of meal intake, and administer insulin within 1 hour of the time glucose is checked.  NOTE: In reviewing the chart, noted that the patient received Novolog 27 units at 9:45 am on 04/06/15 (20 units for meal coverage and 7 units for glucose of 233 mg/dl obtained at 8:06 am). Glucose was checked again at 11:08 am and resulted with glucose of 237 mg/dl. Patient was given Novolog 27 units at 11:36 am on 04/06/15 (20 units for meal coverage and 7 units for glucose of 237 mg/dl). Therefore, patient received a total of Novolog 54 units within 2 hours which resulted in hypoglycemia (glucose 45 mg/dl at 16:32 on 04/06/15). No meal intake was charted for breakfast or lunch on 04/06/15 so unclear if patient ate at least 50% of meals.  Thanks, Barnie Alderman, RN, MSN, CDE Diabetes Coordinator Inpatient Diabetes Program [phone] (Team Pager from [place] to [place]) [phone] ([place] office) [phone] [facility] office) [phone] [facility]  office)

## 2015-04-07 NOTE — Progress Notes (Signed)
[name] NAME: Christine Sims    MR#:  [id number]  DATE OF BIRTH:  [birth year]  SUBJECTIVE:   Pt. Here due to shortness of breath, wheezing and noted to be in COPD Exacerbation w/ pneumonia.  Continues to have significant wheezing/bronchospasm.    REVIEW OF SYSTEMS:    Review of Systems  Constitutional: Negative for fever and chills.  HENT: Negative for congestion and tinnitus.   Eyes: Negative for blurred vision and double vision.  Respiratory: Positive for cough, shortness of breath and wheezing.   Cardiovascular: Negative for chest pain, orthopnea and PND.  Gastrointestinal: Negative for nausea, vomiting, abdominal pain and diarrhea.  Genitourinary: Negative for dysuria and hematuria.  Neurological: Negative for dizziness, sensory change and focal weakness.  All other systems reviewed and are negative.   Nutrition: heart Healthy Tolerating Diet: Yes Tolerating PT: Await Eval.   DRUG ALLERGIES:   Allergies  Allergen Reactions  . Keppra [Levetiracetam]     Anaphylaxis   . Lisinopril     anaphylaxis    VITALS:  Blood pressure 147/74, pulse 88, temperature 98.2 F (36.8 C), temperature source Oral, resp. rate 18, height 5\' 4"  (1.626 m), weight 126.735 kg (279 lb 6.4 oz), SpO2 92 %.  PHYSICAL EXAMINATION:   Physical Exam  GENERAL:  60 y.o.-year-old obese patient lying in the bed in mild resp. Distress.  EYES: Pupils equal, round, reactive to light and accommodation. No scleral icterus. Extraocular muscles intact.  HEENT: Head atraumatic, normocephalic. Oropharynx and nasopharynx clear.  NECK:  Supple, no jugular venous distention. No thyroid enlargement, no tenderness.  LUNGS: Good air entry bilaterally. Positive wheezing, rhonchi diffusely bilaterally. + use of accessory muscles.   CARDIOVASCULAR: S1, S2 normal. No murmurs, rubs, or gallops.  ABDOMEN: Soft, nontender, nondistended. Bowel sounds present.  No organomegaly or mass.  EXTREMITIES: No cyanosis, clubbing or edema b/l.    NEUROLOGIC: Cranial nerves II through XII are intact. No focal Motor or sensory deficits b/l.   PSYCHIATRIC: The patient is alert and oriented x 3. Good affect.  SKIN: No obvious rash, lesion, or ulcer.    LABORATORY PANEL:   CBC  Recent Labs Lab 04/05/15 0336 04/06/15 1338  WBC 8.1  --   HGB 8.0* 8.3*  HCT 24.9*  --   PLT 268  --    ------------------------------------------------------------------------------------------------------------------  Chemistries   Recent Labs Lab 04/06/15 0437  NA 140  K 4.0  CL 108  CO2 24  GLUCOSE 259*  BUN 44*  CREATININE 1.52*  CALCIUM 8.8*   ------------------------------------------------------------------------------------------------------------------  Cardiac Enzymes  Recent Labs Lab 04/04/15 1220  TROPONINI 0.03   ------------------------------------------------------------------------------------------------------------------  RADIOLOGY:  No results found.   ASSESSMENT AND PLAN:   60 yo female w/ hx of COPD w/ ongoing tobacco abuse, Diabetes, hx of previous CVA, hx of previous MI, HTN, Hyperlipidemia who presented to the hospital w/ shortness of breath due to COPD Exacerbation.    1. COPD Exacerbation - due to pneumonia and ongoing tobacco abuse. Slow to improve.   - cont. IV steroids but will increase dose, cont. ATC duonebs, Pulm. Nebs.  - cont. IV Ceftriaxone, Zithromax. Await sputum Cxr.  - assess for Home O2 prior to discharge.   2. Pneumonia - RLL. CAP.  - cont. IV Ceftriaxone, Zithromax.   - BC (-) and sputum Cx consistent w/ normal Flora  3. DM Type II w/out complication - BS stable.  A bit hypoglycemic yesterday afternoon.    -  cont. Lantus, novolog with meals, SSI  4. Hx of previous CVA - cont. Plavix, Statin.    5. HTN - cont. Metoprolol, HCTZ/Losartan, Norvasc.   6. Anxiety/Depression - cont. Buspar, Zoloft,  Trazodone.   7. Syncope-likely tussive syncope as it usually happens after her coughing paroxysms - no alarms on tele overnight. NO further witnessed episodes since yesterday.  Will d/c Tele.   All the records are reviewed and case discussed with Care Management/Social Workerr. Management plans discussed with the patient, family and they are in agreement.  CODE STATUS: Full  DVT Prophylaxis: Heparin subcutaneous  TOTAL TIME TAKING CARE OF THIS PATIENT: 30 minutes.   POSSIBLE D/C IN 2-3 DAYS, DEPENDING ON CLINICAL CONDITION.   [name] M.D on 04/07/2015 at 2:51 PM  Between 7am to 6pm - Pager - [phone]  After 6pm go to [email or web address] - password [email or web address] [facility]  [facility]  Office  [phone]  CC: Primary care physician; Philis Fendt, MD

## 2015-04-08 LAB — CBC
HCT: 28.8 % — ABNORMAL LOW (ref 35.0–47.0)
Hemoglobin: 9.5 g/dL — ABNORMAL LOW (ref 12.0–16.0)
MCH: 25.7 pg — AB (ref 26.0–34.0)
MCHC: 32.9 g/dL (ref 32.0–36.0)
MCV: 78.3 fL — ABNORMAL LOW (ref 80.0–100.0)
Platelets: 426 10*3/uL (ref 150–440)
RBC: 3.68 MIL/uL — ABNORMAL LOW (ref 3.80–5.20)
RDW: 18 % — AB (ref 11.5–14.5)
WBC: 11 10*3/uL (ref 3.6–11.0)

## 2015-04-08 LAB — GLUCOSE, CAPILLARY
GLUCOSE-CAPILLARY: 479 mg/dL — AB (ref 65–99)
GLUCOSE-CAPILLARY: 273 mg/dL — AB (ref 65–99)
Glucose-Capillary: 387 mg/dL — ABNORMAL HIGH (ref 65–99)

## 2015-04-08 LAB — PROCALCITONIN

## 2015-04-08 LAB — BASIC METABOLIC PANEL
Anion gap: 11 (ref 5–15)
BUN: 32 mg/dL — AB (ref 6–20)
CALCIUM: 9.5 mg/dL (ref 8.9–10.3)
CHLORIDE: 101 mmol/L (ref 101–111)
CO2: 24 mmol/L (ref 22–32)
CREATININE: 1.42 mg/dL — AB (ref 0.44–1.00)
GFR calc non Af Amer: 40 mL/min — ABNORMAL LOW (ref >60)
GFR, EST AFRICAN AMERICAN: 46 mL/min — AB (ref >60)
Glucose, Bld: 452 mg/dL — ABNORMAL HIGH (ref 65–99)
Potassium: 4.4 mmol/L (ref 3.5–5.1)
SODIUM: 136 mmol/L (ref 135–145)

## 2015-04-08 MED ORDER — DEXTROSE 5 % IV SOLN
500.0000 mg | INTRAVENOUS | Status: DC
  Administered 2015-04-08 – 2015-04-11 (×4): 500 mg via INTRAVENOUS
  Filled 2015-04-08 (×4): qty 500

## 2015-04-08 MED ORDER — CLONIDINE HCL 0.1 MG PO TABS
0.1000 mg | ORAL_TABLET | Freq: Two times a day (BID) | ORAL | Status: DC
  Administered 2015-04-08 – 2015-04-09 (×3): 0.1 mg via ORAL
  Filled 2015-04-08 (×3): qty 1

## 2015-04-08 NOTE — Progress Notes (Signed)
BS 479. DR Verdell Carmine called, give schedule 20 of novolog, and 20 of sliding scale, as well as her scheduled lantus.

## 2015-04-08 NOTE — Progress Notes (Signed)
Patient had a nosebleed. Ice pack applied. No more bleeding noticed.

## 2015-04-08 NOTE — Progress Notes (Signed)
[place] [facility] at [place] NAME: Christine Sims    MR#:  [id number]  DATE OF BIRTH:  1955/09/01  SUBJECTIVE:   Pt. Here due to shortness of breath, wheezing and noted to be in COPD Exacerbation w/ pneumonia.  Mild improvement in wheezing/bronchospam.  BS a bit elevated due to IV steroids.    REVIEW OF SYSTEMS:    Review of Systems  Constitutional: Negative for fever and chills.  HENT: Negative for congestion and tinnitus.   Eyes: Negative for blurred vision and double vision.  Respiratory: Positive for cough, shortness of breath and wheezing.   Cardiovascular: Negative for chest pain, orthopnea and PND.  Gastrointestinal: Negative for nausea, vomiting, abdominal pain and diarrhea.  Genitourinary: Negative for dysuria and hematuria.  Neurological: Negative for dizziness, sensory change and focal weakness.  All other systems reviewed and are negative.   Nutrition: heart Healthy Tolerating Diet: Yes Tolerating PT: Await Eval.   DRUG ALLERGIES:   Allergies  Allergen Reactions  . Keppra [Levetiracetam]     Anaphylaxis   . Lisinopril     anaphylaxis    VITALS:  Blood pressure 181/89, pulse 94, temperature 98.3 F (36.8 C), temperature source Oral, resp. rate 20, height 5\' 4"  (1.626 m), weight 126.735 kg (279 lb 6.4 oz), SpO2 100 %.  PHYSICAL EXAMINATION:   Physical Exam  GENERAL:  60 y.o.-year-old obese patient lying in the bed in mild resp. Distress.  EYES: Pupils equal, round, reactive to light and accommodation. No scleral icterus. Extraocular muscles intact.  HEENT: Head atraumatic, normocephalic. Oropharynx and nasopharynx clear.  NECK:  Supple, no jugular venous distention. No thyroid enlargement, no tenderness.  LUNGS: Good air entry bilaterally. Positive wheezing, rhonchi diffusely bilaterally. (-) use of accessory muscles.   CARDIOVASCULAR: S1, S2 normal. No murmurs, rubs, or gallops.  ABDOMEN: Soft, nontender,  nondistended. Bowel sounds present. No organomegaly or mass.  EXTREMITIES: No cyanosis, clubbing or edema b/l.    NEUROLOGIC: Cranial nerves II through XII are intact. No focal Motor or sensory deficits b/l.   PSYCHIATRIC: The patient is alert and oriented x 3. Good affect.  SKIN: No obvious rash, lesion, or ulcer.    LABORATORY PANEL:   CBC  Recent Labs Lab 04/08/15 0601  WBC 11.0  HGB 9.5*  HCT 28.8*  PLT 426   ------------------------------------------------------------------------------------------------------------------  Chemistries   Recent Labs Lab 04/08/15 0601  NA 136  K 4.4  CL 101  CO2 24  GLUCOSE 452*  BUN 32*  CREATININE 1.42*  CALCIUM 9.5   ------------------------------------------------------------------------------------------------------------------  Cardiac Enzymes  Recent Labs Lab 04/04/15 1220  TROPONINI 0.03   ------------------------------------------------------------------------------------------------------------------  RADIOLOGY:  No results found.   ASSESSMENT AND PLAN:   60 yo female w/ hx of COPD w/ ongoing tobacco abuse, Diabetes, hx of previous CVA, hx of previous MI, HTN, Hyperlipidemia who presented to the hospital w/ shortness of breath due to COPD Exacerbation.    1. COPD Exacerbation - due to pneumonia and ongoing tobacco abuse. Mild improvement since yesterday.  - cont. IV steroids, cont. ATC duonebs, Pulm. Nebs.   - cont. IV Ceftriaxone, Zithromax. Sputum Cx consistent w/ normal flora.  - assess for Home O2 prior to discharge.   2. Pneumonia - RLL. CAP.  - cont. IV Ceftriaxone, Zithromax.   - BC (-) and sputum Cx consistent w/ normal Flora  3. DM Type II w/out complication - BS elevated due to IV steroids.     - cont. Lantus,  novolog with meals, SSI.  If needed will advance Lantus dose.   4. Hx of previous CVA - cont. Plavix, Statin.    5. HTN - cont. Metoprolol, HCTZ/Losartan, Norvasc.   6.  Anxiety/Depression - cont. Buspar, Zoloft, Trazodone.   7. Syncope-likely tussive syncope as it usually happens after her coughing paroxysms - NO further witnessed episodes and will cont. To monitor. Off tele now.   Will get PT eval.    All the records are reviewed and case discussed with Care Management/Social Workerr. Management plans discussed with the patient, family and they are in agreement.  CODE STATUS: Full  DVT Prophylaxis: Heparin subcutaneous  TOTAL TIME TAKING CARE OF THIS PATIENT: 30 minutes.   POSSIBLE D/C IN 2-3 DAYS, DEPENDING ON CLINICAL CONDITION.   [name] M.D on 04/08/2015 at 3:02 PM  Between 7am to 6pm - Pager - [phone]  After 6pm go to [email or web address] - password [email or web address] [facility]  [place] [facility]  Office  [phone]  CC: Primary care physician; Philis Fendt, MD

## 2015-04-08 NOTE — Progress Notes (Signed)
Inpatient Diabetes Program Recommendations  AACE/ADA: New Consensus Statement on Inpatient Glycemic Control (2015)  Target Ranges:  Prepandial:   less than 140 mg/dL      Peak postprandial:   less than 180 mg/dL (1-2 hours)      Critically ill patients:  140 - 180 mg/dL   No documentation of dietary intake at supper yesterday and no Novolog given at that meal (no documentation of why it was held). Steroids given yesterday afternoon.  Fasting blood sugar elevated as a result- 479mg /dl  Insulin: NURSING: Please be sure patient is eating at least 50% of meal in order to give meal coverage insulin, please chart percentage of meal intake, and administer insulin within 1 hour of the time glucose is checked. If patient does not eat 50% of the meal, still give the "correction Novolog" as ordered.  Review of Glycemic Control  Results for NICOLENA, ANTOLIK (MRN [id number]) as of 04/08/2015 08:49  Ref. Range 04/07/2015 10:51 04/07/2015 16:21 04/07/2015 17:28 04/07/2015 20:45 04/08/2015 08:41  Glucose-Capillary Latest Ref Range: 65-99 mg/dL 239 (H) 63 (L) 86 154 (H) 479 (H)   Diabetes history: DM2  Outpatient Diabetes medications: Levemir 75 units BID, Humalog 30 units TID with meals  Current orders for Inpatient glycemic control: Levemir 75 units BID, Novolog 20 units TID with meals for meal coverage, Novolog 0-20 units TID with meals, Novolog 0-5 units QHS   Gentry Fitz, RN, [place], [place], CDE Diabetes Coordinator Inpatient Diabetes Program  [phone] (Team Pager) [phone] ([place]) 04/08/2015 7:24 AM

## 2015-04-09 ENCOUNTER — Inpatient Hospital Stay: Payer: Medicaid Other

## 2015-04-09 LAB — GLUCOSE, CAPILLARY
GLUCOSE-CAPILLARY: 430 mg/dL — AB (ref 65–99)
GLUCOSE-CAPILLARY: 378 mg/dL — AB (ref 65–99)
GLUCOSE-CAPILLARY: 232 mg/dL — AB (ref 65–99)
Glucose-Capillary: 417 mg/dL — ABNORMAL HIGH (ref 65–99)
Glucose-Capillary: 93 mg/dL (ref 65–99)
Glucose-Capillary: 127 mg/dL — ABNORMAL HIGH (ref 65–99)

## 2015-04-09 LAB — CULTURE, BLOOD (ROUTINE X 2)
CULTURE: NO GROWTH
Culture: NO GROWTH

## 2015-04-09 MED ORDER — CLONIDINE HCL 0.1 MG PO TABS
0.2000 mg | ORAL_TABLET | Freq: Two times a day (BID) | ORAL | Status: DC
Start: 2015-04-09 — End: 2015-04-15
  Administered 2015-04-09 – 2015-04-15 (×12): 0.2 mg via ORAL
  Filled 2015-04-09 (×13): qty 2

## 2015-04-09 MED ORDER — INSULIN ASPART 100 UNIT/ML ~~LOC~~ SOLN
25.0000 [IU] | Freq: Three times a day (TID) | SUBCUTANEOUS | Status: DC
  Administered 2015-04-09 – 2015-04-14 (×17): 25 [IU] via SUBCUTANEOUS
  Filled 2015-04-09 (×9): qty 25
  Filled 2015-04-09: qty 20
  Filled 2015-04-09 (×3): qty 25
  Filled 2015-04-09: qty 1
  Filled 2015-04-09: qty 25
  Filled 2015-04-09: qty 1
  Filled 2015-04-09: qty 25

## 2015-04-09 MED ORDER — INSULIN GLARGINE 100 UNIT/ML ~~LOC~~ SOLN
85.0000 [IU] | Freq: Two times a day (BID) | SUBCUTANEOUS | Status: DC
  Administered 2015-04-10 – 2015-04-14 (×10): 85 [IU] via SUBCUTANEOUS
  Filled 2015-04-09 (×14): qty 0.85

## 2015-04-09 MED ORDER — METHYLPREDNISOLONE SODIUM SUCC 125 MG IJ SOLR
60.0000 mg | Freq: Two times a day (BID) | INTRAMUSCULAR | Status: DC
  Administered 2015-04-09 – 2015-04-12 (×6): 60 mg via INTRAVENOUS
  Filled 2015-04-09 (×6): qty 2

## 2015-04-09 NOTE — Progress Notes (Signed)
Pt with FBS of 93 and then after small meal came up to 127. Pt has 85 units of lantus ordered. Spoke with Dr. Jannifer Franklin he wants to hold off on lantus tonight and follow sliding scale.

## 2015-04-09 NOTE — Progress Notes (Signed)
PT Cancellation Note  Patient Details Name: Christine Sims MRN: [id number] DOB: Sep 14, 1955   Cancelled Treatment:    Reason Eval/Treat Not Completed: Fatigue/lethargy limiting ability to participate  Spoke with RN, pt available for PT but has been spitting up blood this a.m. and BS continues to run in the 400's.  Attempted to evaluate pt, but pt very lethargic, unable to keep eyes open, and pt asking to defer evaluation to a later time due to "just not feeling well right now."  Will continue to follow as able.  Laylani Pudwill A Jazalyn Mondor, PT 04/09/2015, 9:26 AM

## 2015-04-09 NOTE — Progress Notes (Signed)
[place] at [place] NAME: Christine Sims    MR#:  [id number]  DATE OF BIRTH:  [birth year]  SUBJECTIVE:   Pt. Here due to shortness of breath, wheezing and noted to be in COPD Exacerbation w/ pneumonia. Still having some blood tinged sputum.  Wheezing/bronchospasm improving.    REVIEW OF SYSTEMS:    Review of Systems  Constitutional: Negative for fever and chills.  HENT: Negative for congestion and tinnitus.   Eyes: Negative for blurred vision and double vision.  Respiratory: Positive for cough, hemoptysis, shortness of breath and wheezing.   Cardiovascular: Negative for chest pain, orthopnea and PND.  Gastrointestinal: Negative for nausea, vomiting, abdominal pain and diarrhea.  Genitourinary: Negative for dysuria and hematuria.  Neurological: Negative for dizziness, sensory change and focal weakness.  All other systems reviewed and are negative.   Nutrition: heart Healthy Tolerating Diet: Yes Tolerating PT: Await Eval.   DRUG ALLERGIES:   Allergies  Allergen Reactions  . Keppra [Levetiracetam]     Anaphylaxis   . Lisinopril     anaphylaxis    VITALS:  Blood pressure 164/86, pulse 80, temperature 98.2 F (36.8 C), temperature source Oral, resp. rate 18, height 5\' 4"  (1.626 m), weight 126.735 kg (279 lb 6.4 oz), SpO2 96 %.  PHYSICAL EXAMINATION:   Physical Exam  GENERAL:  60 y.o.-year-old obese patient lying in the bed in mild resp. Distress.  EYES: Pupils equal, round, reactive to light and accommodation. No scleral icterus. Extraocular muscles intact.  HEENT: Head atraumatic, normocephalic. Oropharynx and nasopharynx clear.  NECK:  Supple, no jugular venous distention. No thyroid enlargement, no tenderness.  LUNGS: Good air entry bilaterally. Positive wheezing, rhonchi diffusely bilaterally. (-) use of accessory muscles.   CARDIOVASCULAR: S1, S2 normal. No murmurs, rubs, or gallops.  ABDOMEN: Soft, nontender,  nondistended. Bowel sounds present. No organomegaly or mass.  EXTREMITIES: No cyanosis, clubbing or edema b/l.    NEUROLOGIC: Cranial nerves II through XII are intact. No focal Motor or sensory deficits b/l.   PSYCHIATRIC: The patient is alert and oriented x 3. Good affect.  SKIN: No obvious rash, lesion, or ulcer.    LABORATORY PANEL:   CBC  Recent Labs Lab 04/08/15 0601  WBC 11.0  HGB 9.5*  HCT 28.8*  PLT 426   ------------------------------------------------------------------------------------------------------------------  Chemistries   Recent Labs Lab 04/08/15 0601  NA 136  K 4.4  CL 101  CO2 24  GLUCOSE 452*  BUN 32*  CREATININE 1.42*  CALCIUM 9.5   ------------------------------------------------------------------------------------------------------------------  Cardiac Enzymes  Recent Labs Lab 04/04/15 1220  TROPONINI 0.03   ------------------------------------------------------------------------------------------------------------------  RADIOLOGY:  Ct Chest Wo Contrast  04/09/2015  CLINICAL DATA:  Short of breath.  Hemoptysis.  Symptoms for 6 days. EXAM: CT CHEST WITHOUT CONTRAST TECHNIQUE: Multidetector CT imaging of the chest was performed following the standard protocol without IV contrast. COMPARISON:  04/04/2015 FINDINGS: No abnormal mediastinal adenopathy. Three vessel coronary artery calcifications. Tiny pleural effusions.  No pneumothorax. Central right lung mass is suspected inferior to the right hilum on image 31 measuring 1.9 cm. Right lower lobe airspace disease has improved. There is some residual heterogeneous opacities. Dependent atelectasis at the left base. No acute bony deformity. C6-7 degenerative disc disease with posterior osteophytes. IMPRESSION: Central lung mass inferior to the right hilum is suspected measuring 1.9 cm. Contrast enhanced CT is recommended. Improved right lower lobe airspace disease. Continued followup until complete  resolution is recommended. Tiny pleural effusions are developed.  Electronically Signed   By: Marybelle Killings M.D.   On: 04/09/2015 14:37     ASSESSMENT AND PLAN:   60 yo female w/ hx of COPD w/ ongoing tobacco abuse, Diabetes, hx of previous CVA, hx of previous MI, HTN, Hyperlipidemia who presented to the hospital w/ shortness of breath due to COPD Exacerbation.    1. COPD Exacerbation - due to pneumonia and ongoing tobacco abuse. Continues to slowly improve.   - cont. IV steroids but will taper a bit, cont. ATC duonebs, Pulm. Nebs.   - cont. IV Ceftriaxone, Zithromax. Sputum Cx consistent w/ normal flora.  - assess for Home O2 prior to discharge.   2. Pneumonia - RLL. CAP.  - cont. IV Ceftriaxone, Zithromax.   - BC (-) and sputum Cx consistent w/ normal Flora  3. Hemptoysis - likely due to pneumonia.  - CT chest showing pneumonia and no evidence of other pathology or endobronchial mass.   4. DM Type II w/out complication - BS elevated due to IV steroids.     - will increase Lantus, increase novolog with meals, cont. SSI.   5. Hx of previous CVA - cont. Plavix, Statin.    6. HTN - cont. Metoprolol, HCTZ/Losartan, Norvasc.   7. Anxiety/Depression - cont. Buspar, Zoloft, Trazodone.   7. Syncope-likely tussive syncope as it usually happens after her coughing paroxysms - No further witnessed episodes. Off tele now.   Await PT eval.   All the records are reviewed and case discussed with Care Management/Social Workerr. Management plans discussed with the patient, family and they are in agreement.  CODE STATUS: Full  DVT Prophylaxis: Heparin subcutaneous  TOTAL TIME TAKING CARE OF THIS PATIENT: 30 minutes.   POSSIBLE D/C IN 1-2 DAYS, DEPENDING ON CLINICAL CONDITION.   [name] M.D on 04/09/2015 at 2:59 PM  Between 7am to 6pm - Pager - [phone]  After 6pm go to [email or web address] - password [email or web address] [facility]  [place] [facility]  Office  [phone]  CC: Primary care  physician; Philis Fendt, MD

## 2015-04-10 LAB — CBC
HEMATOCRIT: 30.4 % — AB (ref 35.0–47.0)
HEMOGLOBIN: 9.5 g/dL — AB (ref 12.0–16.0)
MCH: 24.7 pg — AB (ref 26.0–34.0)
MCHC: 31.3 g/dL — ABNORMAL LOW (ref 32.0–36.0)
MCV: 79 fL — AB (ref 80.0–100.0)
PLATELETS: 411 10*3/uL (ref 150–440)
RBC: 3.85 MIL/uL (ref 3.80–5.20)
RDW: 18.3 % — ABNORMAL HIGH (ref 11.5–14.5)
WBC: 14.7 10*3/uL — ABNORMAL HIGH (ref 3.6–11.0)

## 2015-04-10 LAB — GLUCOSE, CAPILLARY
GLUCOSE-CAPILLARY: 362 mg/dL — AB (ref 65–99)
GLUCOSE-CAPILLARY: 176 mg/dL — AB (ref 65–99)
Glucose-Capillary: 367 mg/dL — ABNORMAL HIGH (ref 65–99)
Glucose-Capillary: 336 mg/dL — ABNORMAL HIGH (ref 65–99)

## 2015-04-10 NOTE — Progress Notes (Signed)
Patient ID: [name], female   DOB: [birth year], 60 y.o.   MRN: [id number] [facility] PROGRESS NOTE  CZARINA STANDEFER [id number] DOB: 1955-05-18 DOA: 04/04/2015 PCP: Philis Fendt, MD  HPI/Subjective: Patient with shortness of breath and coughing up blood.  Occassional nose bleed. Some wheeze. Not feeling well at all.  Objective: Filed Vitals:   04/10/15 0755 04/10/15 1552  BP:  149/89  Pulse: 79 82  Temp:  97.6 F (36.4 C)  Resp:  17    Filed Weights   04/04/15 1159 04/04/15 1733  Weight: 111.494 kg (245 lb 12.8 oz) 126.735 kg (279 lb 6.4 oz)    ROS: Review of Systems  Constitutional: Negative for fever and chills.  Eyes: Negative for blurred vision.  Respiratory: Positive for cough, hemoptysis, shortness of breath and wheezing.   Cardiovascular: Negative for chest pain.  Gastrointestinal: Negative for nausea, vomiting, abdominal pain, diarrhea and constipation.  Genitourinary: Negative for dysuria.  Musculoskeletal: Negative for joint pain.  Neurological: Negative for dizziness and headaches.   Exam: Physical Exam  Constitutional: She is oriented to person, place, and time.  HENT:  Nose: No mucosal edema.  Mouth/Throat: No oropharyngeal exudate or posterior oropharyngeal edema.  Eyes: Conjunctivae, EOM and lids are normal. Pupils are equal, round, and reactive to light.  Neck: No JVD present. Carotid bruit is not present. No edema present. No thyroid mass and no thyromegaly present.  Cardiovascular: S1 normal and S2 normal.  Exam reveals no gallop.   No murmur heard. Pulses:      Dorsalis pedis pulses are 2+ on the right side, and 2+ on the left side.  Respiratory: No respiratory distress. She has no wheezes. She has no rhonchi. She has no rales.  GI: Soft. Bowel sounds are normal. There is no tenderness.  Musculoskeletal:       Right ankle: She exhibits swelling.       Left ankle: She exhibits swelling.  Lymphadenopathy:    She has no  cervical adenopathy.  Neurological: She is alert and oriented to person, place, and time. No cranial nerve deficit.  Skin: Skin is warm. No rash noted. Nails show no clubbing.  Psychiatric: She has a normal mood and affect.      Data Reviewed: Basic Metabolic Panel:  Recent Labs Lab 04/04/15 1220 04/04/15 2234 04/05/15 0336 04/06/15 0437 04/08/15 0601  NA 138  --  134* 140 136  K 4.6  --  4.2 4.0 4.4  CL 106  --  103 108 101  CO2 22  --  23 24 24   GLUCOSE 165* 534* 468* 259* 452*  BUN 33*  --  45* 44* 32*  CREATININE 2.06*  --  1.84* 1.52* 1.42*  CALCIUM 8.4*  --  8.2* 8.8* 9.5   CBC:  Recent Labs Lab 04/04/15 1220 04/05/15 0336 04/06/15 1338 04/08/15 0601 04/10/15 0347  WBC 11.1* 8.1  --  11.0 14.7*  HGB 9.1* 8.0* 8.3* 9.5* 9.5*  HCT 29.4* 24.9*  --  28.8* 30.4*  MCV 79.1* 77.8*  --  78.3* 79.0*  PLT 231 268  --  426 411   Cardiac Enzymes:  Recent Labs Lab 04/04/15 1220  TROPONINI 0.03   BNP (last 3 results)  Recent Labs  04/04/15 1220  BNP 121.0*    CBG:  Recent Labs Lab 04/09/15 2101 04/09/15 2204 04/10/15 0756 04/10/15 1137 04/10/15 1554  GLUCAP 93 127* 367* 336* 362*    Recent Results (from the past 240 hour(s))  Culture, blood (routine x 2)     Status: None   Collection Time: 04/04/15  2:02 PM  Result Value Ref Range Status   Specimen Description BLOOD LEFT HAND  Final   Special Requests BOTTLES DRAWN AEROBIC AND ANAEROBIC  1CC  Final   Culture NO GROWTH 5 DAYS  Final   Report Status 04/09/2015 FINAL  Final  Culture, blood (routine x 2)     Status: None   Collection Time: 04/04/15  2:02 PM  Result Value Ref Range Status   Specimen Description BLOOD RIGHT HAND  Final   Special Requests BOTTLES DRAWN AEROBIC AND ANAEROBIC  1CC  Final   Culture NO GROWTH 5 DAYS  Final   Report Status 04/09/2015 FINAL  Final  Culture, expectorated sputum-assessment     Status: None   Collection Time: 04/04/15  5:27 PM  Result Value Ref Range  Status   Specimen Description EXPECTORATED SPUTUM  Final   Special Requests NONE  Final   Sputum evaluation THIS SPECIMEN IS ACCEPTABLE FOR SPUTUM CULTURE  Final   Report Status 04/04/2015 FINAL  Final  Culture, respiratory (NON-Expectorated)     Status: None   Collection Time: 04/04/15  5:27 PM  Result Value Ref Range Status   Specimen Description EXPECTORATED SPUTUM  Final   Special Requests NONE Reflexed from [id number]  Final   Gram Stain   Final    FAIR SPECIMEN - 70-80% WBCS MODERATE WBC SEEN MODERATE GRAM POSITIVE COCCI IN PAIRS IN CLUSTERS FEW GRAM NEGATIVE COCCOBACILLI RARE GRAM NEGATIVE RODS    Culture Consistent with normal respiratory flora.  Final   Report Status 04/07/2015 FINAL  Final     Studies: Ct Chest Wo Contrast  04/09/2015  CLINICAL DATA:  Short of breath.  Hemoptysis.  Symptoms for 6 days. EXAM: CT CHEST WITHOUT CONTRAST TECHNIQUE: Multidetector CT imaging of the chest was performed following the standard protocol without IV contrast. COMPARISON:  04/04/2015 FINDINGS: No abnormal mediastinal adenopathy. Three vessel coronary artery calcifications. Tiny pleural effusions.  No pneumothorax. Central right lung mass is suspected inferior to the right hilum on image 31 measuring 1.9 cm. Right lower lobe airspace disease has improved. There is some residual heterogeneous opacities. Dependent atelectasis at the left base. No acute bony deformity. C6-7 degenerative disc disease with posterior osteophytes. IMPRESSION: Central lung mass inferior to the right hilum is suspected measuring 1.9 cm. Contrast enhanced CT is recommended. Improved right lower lobe airspace disease. Continued followup until complete resolution is recommended. Tiny pleural effusions are developed. Electronically Signed   By: Marybelle Killings M.D.   On: 04/09/2015 14:37    Scheduled Meds: . amLODipine  10 mg Oral Daily  . atorvastatin  80 mg Oral QHS  . azithromycin  500 mg Intravenous Q24H  . budesonide  (PULMICORT) nebulizer solution  0.25 mg Nebulization BID  . busPIRone  30 mg Oral BID  . cefTRIAXone (ROCEPHIN)  IV  1 g Intravenous Q24H  . cloNIDine  0.2 mg Oral BID  . gabapentin  900 mg Oral QHS  . losartan  100 mg Oral Daily   And  . hydrochlorothiazide  25 mg Oral Daily  . insulin aspart  0-20 Units Subcutaneous TID WC  . insulin aspart  0-5 Units Subcutaneous QHS  . insulin aspart  25 Units Subcutaneous TID WC  . insulin glargine  85 Units Subcutaneous BID  . ipratropium-albuterol  3 mL Nebulization Q4H  . methylPREDNISolone (SOLU-MEDROL) injection  60 mg Intravenous Q12H  .  metoprolol succinate  100 mg Oral Daily  . pantoprazole  40 mg Oral Daily  . sertraline  100 mg Oral Daily  . traZODone  200 mg Oral QHS    Assessment/Plan:  1. COPD exacerbation with pneumonia and ongoing tobacco abuse. Continue IV Solu-Medrol 60 mg IV every 12 hours. On budesonide nebulizer and DuoNeb. Patient is on Rocephin and finished Zithromax 2. Community-acquired pneumonia right lower lobe. On IV Rocephin and finished Zithromax 3. Hemoptysis likely secondary to pneumonia. CT scan of the chest showing a 1.9 cm right lung mass. I will get pulmonary consultation and likely will need a PET CT scan as outpatient once off steroids and biopsy. Stop Plavix. 4. Type 2 diabetes with hyperglycemia. Blood sugars elevated with steroids 5. History of CVA. Plavix stopped with hemoptysis. Continue statin 6. Essential hypertension continue usual meds 7. Anxiety depression continue usual medication 8. Vasovagal cough syncope 9. Weakness- appreciate physical therapy evaluation  Code Status:     Code Status Orders        Start     Ordered   04/04/15 1514  Full code   Continuous     04/04/15 1513    Code Status History    Date Active Date Inactive Code Status Order ID Comments User Context   This patient has a current code status but no historical code status.     Family Communication: Tried to call  family but 1 number no one picked up. Another number family had stepped out. Disposition Plan: To be determined  Consultants:  Pulmonary  Antibiotics:  Rocephin  Time spent: 25 minutes  Loletha Grayer  [facility]

## 2015-04-10 NOTE — Progress Notes (Signed)
Physical Therapy Evaluation Patient Details Name: Christine Sims MRN: [id number] DOB: [birth year] Today's Date: 04/10/2015   History of Present Illness  60 year old African-American female history of type 2 diabetes insulin requiring as well as moderate persistent asthma presenting with shortness of breath  Clinical Impression  Pt presents with near baseline functional mobility at this time.  Pt able to get in/out of bed with modified independnece and reports some tightness in her calf muscles.  Pt able to ambulate with RW for 10' which is her tolerance at this time.  Pt continues with audible wheezing during mobility and has decreased endurance with ambulation but O2 sats remain above 90% on exertion on room air.  Recommend pt to ambulate with nursing for strengthening and pulmonary hygiene.  D/C PT services.   Follow Up Recommendations No PT follow up    Equipment Recommendations  Rolling walker with 5" wheels    Recommendations for Other Services Other (comment) (ambulate with assist from nursing)     Precautions / Restrictions Precautions Precautions: Fall Precaution Comments: High Restrictions Weight Bearing Restrictions: No      Mobility  Bed Mobility Overal bed mobility: Modified Independent (HOB elevated)             General bed mobility comments: Easily exited bed, swinging legs over the side and pushing up into sitting with UE's on bed rail  Transfers Overall transfer level: Modified independent Equipment used: None             General transfer comment: Stood up easily with no deficits.  Ambulation/Gait Ambulation/Gait assistance: Supervision Ambulation Distance (Feet): 60 Feet Assistive device: Rolling walker (2 wheeled)       General Gait Details: Widened base of support due to lagre body habitus with increased lateral movements, but otherwise no other gait deficits and good balance   Stairs            Wheelchair Mobility    Modified  Rankin (Stroke Patients Only)       Balance Overall balance assessment: Modified Independent                                           Pertinent Vitals/Pain Pain Assessment: No/denies pain    Home Living Family/patient expects to be discharged to:: Private residence Living Arrangements: Alone Available Help at Discharge: Family Type of Home: House Home Access: Stairs to enter Entrance Stairs-Rails: Right Entrance Stairs-Number of Steps: 3 Home Layout: One level        Prior Function Level of Independence: Independent with assistive device(s)         Comments: cane, rollator with seat     Hand Dominance        Extremity/Trunk Assessment   Upper Extremity Assessment: Overall WFL for tasks assessed           Lower Extremity Assessment: Overall WFL for tasks assessed ("my calves feel tight")         Communication   Communication: No difficulties  Cognition Arousal/Alertness: Awake/alert Behavior During Therapy: WFL for tasks assessed/performed Overall Cognitive Status: Within Functional Limits for tasks assessed                      General Comments      Exercises        Assessment/Plan    PT Assessment Patent does not need any further PT  services  PT Diagnosis Generalized weakness   PT Problem List    PT Treatment Interventions     PT Goals (Current goals can be found in the Care Plan section)      Frequency     Barriers to discharge        Co-evaluation               End of Session Equipment Utilized During Treatment: Gait belt Activity Tolerance: Patient tolerated treatment well;Patient limited by fatigue (O2 sats at 92-94% on room air after exertion) Patient left: in chair;with chair alarm set;with call bell/phone within reach Nurse Communication: Mobility status         Time: 1047-1105 PT Time Calculation (min) (ACUTE ONLY): 18 min   Charges:   PT Evaluation $PT Eval Low Complexity: 1  Procedure     PT G Codes:        Christine Sims April 19, 2015, 11:59 AM

## 2015-04-11 LAB — GLUCOSE, CAPILLARY
GLUCOSE-CAPILLARY: 407 mg/dL — AB (ref 65–99)
GLUCOSE-CAPILLARY: 250 mg/dL — AB (ref 65–99)
Glucose-Capillary: 256 mg/dL — ABNORMAL HIGH (ref 65–99)
Glucose-Capillary: 412 mg/dL — ABNORMAL HIGH (ref 65–99)

## 2015-04-11 MED ORDER — LACTULOSE 10 GM/15ML PO SOLN
30.0000 g | Freq: Once | ORAL | Status: AC
  Administered 2015-04-11: 30 g via ORAL
  Filled 2015-04-11: qty 60

## 2015-04-11 MED ORDER — POLYETHYLENE GLYCOL 3350 17 G PO PACK: 17.0000 g | PACK | Freq: Every day | ORAL | Status: DC | PRN

## 2015-04-11 NOTE — Progress Notes (Signed)
Patient ID: Christine Sims, female   DOB: 04/22/1955, 60 y.o.   MRN: [id number] [facility] PROGRESS NOTE  ZULEIDY SANER [id number] DOB: 1955-09-15 DOA: 04/04/2015 PCP: Philis Fendt, MD  HPI/Subjective: Patient still coughing up blood. Still feeling short of breath and still wheezing. She does not feel well at all.  Objective: Filed Vitals:   04/11/15 0347 04/11/15 0805  BP: 132/71 184/86  Pulse: 82 83  Temp: 97.9 F (36.6 C) 98.3 F (36.8 C)  Resp: 18 19    Filed Weights   04/04/15 1159 04/04/15 1733  Weight: 111.494 kg (245 lb 12.8 oz) 126.735 kg (279 lb 6.4 oz)    ROS: Review of Systems  Constitutional: Negative for fever and chills.  Eyes: Negative for blurred vision.  Respiratory: Positive for cough, hemoptysis, shortness of breath and wheezing.   Cardiovascular: Negative for chest pain.  Gastrointestinal: Negative for nausea, vomiting, abdominal pain, diarrhea and constipation.  Genitourinary: Negative for dysuria.  Musculoskeletal: Negative for joint pain.  Neurological: Negative for dizziness and headaches.   Exam: Physical Exam  Constitutional: She is oriented to person, place, and time.  HENT:  Nose: No mucosal edema.  Mouth/Throat: No oropharyngeal exudate or posterior oropharyngeal edema.  Eyes: Conjunctivae, EOM and lids are normal. Pupils are equal, round, and reactive to light.  Neck: No JVD present. Carotid bruit is not present. No edema present. No thyroid mass and no thyromegaly present.  Cardiovascular: S1 normal and S2 normal.  Exam reveals no gallop.   No murmur heard. Pulses:      Dorsalis pedis pulses are 2+ on the right side, and 2+ on the left side.  Respiratory: No respiratory distress. She has decreased breath sounds in the right middle field, the right lower field, the left middle field and the left lower field. She has wheezes in the right middle field, the right lower field, the left middle field and the left lower  field. She has no rhonchi. She has no rales.  GI: Soft. Bowel sounds are normal. There is no tenderness.  Musculoskeletal:       Right ankle: She exhibits swelling.       Left ankle: She exhibits swelling.  Lymphadenopathy:    She has no cervical adenopathy.  Neurological: She is alert and oriented to person, place, and time. No cranial nerve deficit.  Skin: Skin is warm. No rash noted. Nails show no clubbing.  Psychiatric: She has a normal mood and affect.      Data Reviewed: Basic Metabolic Panel:  Recent Labs Lab 04/04/15 2234 04/05/15 0336 04/06/15 0437 04/08/15 0601  NA  --  134* 140 136  K  --  4.2 4.0 4.4  CL  --  103 108 101  CO2  --  23 24 24   GLUCOSE 534* 468* 259* 452*  BUN  --  45* 44* 32*  CREATININE  --  1.84* 1.52* 1.42*  CALCIUM  --  8.2* 8.8* 9.5   CBC:  Recent Labs Lab 04/05/15 0336 04/06/15 1338 04/08/15 0601 04/10/15 0347  WBC 8.1  --  11.0 14.7*  HGB 8.0* 8.3* 9.5* 9.5*  HCT 24.9*  --  28.8* 30.4*  MCV 77.8*  --  78.3* 79.0*  PLT 268  --  426 411   BNP (last 3 results)  Recent Labs  04/04/15 1220  BNP 121.0*    CBG:  Recent Labs Lab 04/10/15 1137 04/10/15 1554 04/10/15 2041 04/11/15 0804 04/11/15 1126  GLUCAP 336* 362*  176* 407* 256*    Recent Results (from the past 240 hour(s))  Culture, blood (routine x 2)     Status: None   Collection Time: 04/04/15  2:02 PM  Result Value Ref Range Status   Specimen Description BLOOD LEFT HAND  Final   Special Requests BOTTLES DRAWN AEROBIC AND ANAEROBIC  1CC  Final   Culture NO GROWTH 5 DAYS  Final   Report Status 04/09/2015 FINAL  Final  Culture, blood (routine x 2)     Status: None   Collection Time: 04/04/15  2:02 PM  Result Value Ref Range Status   Specimen Description BLOOD RIGHT HAND  Final   Special Requests BOTTLES DRAWN AEROBIC AND ANAEROBIC  1CC  Final   Culture NO GROWTH 5 DAYS  Final   Report Status 04/09/2015 FINAL  Final  Culture, expectorated sputum-assessment      Status: None   Collection Time: 04/04/15  5:27 PM  Result Value Ref Range Status   Specimen Description EXPECTORATED SPUTUM  Final   Special Requests NONE  Final   Sputum evaluation THIS SPECIMEN IS ACCEPTABLE FOR SPUTUM CULTURE  Final   Report Status 04/04/2015 FINAL  Final  Culture, respiratory (NON-Expectorated)     Status: None   Collection Time: 04/04/15  5:27 PM  Result Value Ref Range Status   Specimen Description EXPECTORATED SPUTUM  Final   Special Requests NONE Reflexed from [id number]  Final   Gram Stain   Final    FAIR SPECIMEN - 70-80% WBCS MODERATE WBC SEEN MODERATE GRAM POSITIVE COCCI IN PAIRS IN CLUSTERS FEW GRAM NEGATIVE COCCOBACILLI RARE GRAM NEGATIVE RODS    Culture Consistent with normal respiratory flora.  Final   Report Status 04/07/2015 FINAL  Final      Scheduled Meds: . amLODipine  10 mg Oral Daily  . atorvastatin  80 mg Oral QHS  . azithromycin  500 mg Intravenous Q24H  . budesonide (PULMICORT) nebulizer solution  0.25 mg Nebulization BID  . busPIRone  30 mg Oral BID  . cefTRIAXone (ROCEPHIN)  IV  1 g Intravenous Q24H  . cloNIDine  0.2 mg Oral BID  . gabapentin  900 mg Oral QHS  . losartan  100 mg Oral Daily   And  . hydrochlorothiazide  25 mg Oral Daily  . insulin aspart  0-20 Units Subcutaneous TID WC  . insulin aspart  0-5 Units Subcutaneous QHS  . insulin aspart  25 Units Subcutaneous TID WC  . insulin glargine  85 Units Subcutaneous BID  . ipratropium-albuterol  3 mL Nebulization Q4H  . lactulose  30 g Oral Once  . methylPREDNISolone (SOLU-MEDROL) injection  60 mg Intravenous Q12H  . metoprolol succinate  100 mg Oral Daily  . pantoprazole  40 mg Oral Daily  . sertraline  100 mg Oral Daily  . traZODone  200 mg Oral QHS    Assessment/Plan:  1. COPD exacerbation with pneumonia and ongoing tobacco abuse. Continue IV Solu-Medrol 60 mg IV every 12 hours. On budesonide nebulizer and DuoNeb. Patient is on Rocephin and finished  Zithromax 2. Community-acquired pneumonia right lower lobe. On IV Rocephin and finished Zithromax 3. Hemoptysis likely secondary to pneumonia. CT scan of the chest showing a 1.9 cm right lung mass. I will get pulmonary consultation and likely will need a PET CT scan as outpatient once off steroids and biopsy. Stop Plavix. 4. Type 2 diabetes with hyperglycemia. Blood sugars elevated with steroids 5. History of CVA. Plavix stopped with hemoptysis. Continue statin  6. Essential hypertension continue usual meds 7. Anxiety depression continue usual medication 8. Vasovagal cough syncope 9. Weakness- appreciate physical therapy evaluation  Code Status:     Code Status Orders        Start     Ordered   04/04/15 1514  Full code   Continuous     04/04/15 1513    Code Status History    Date Active Date Inactive Code Status Order ID Comments User Context   This patient has a current code status but no historical code status.     Family Communication: Spoke with daughter on the phone Disposition Plan: Home once breathing better  Consultants:  Pulmonary  Antibiotics:  Rocephin  Time spent: 25 minutes  Loletha Grayer  [facility]

## 2015-04-11 NOTE — Progress Notes (Addendum)
Date: 04/11/2015,   MRN# [id number] Christine Sims 09-Jun-1955 Code Status:     Code Status Orders        Start     Ordered   04/04/15 1514  Full code   Continuous     04/04/15 1513    Code Status History    Date Active Date Inactive Code Status Order ID Comments User Context   This patient has a current code status but no historical code status.     Hosp day:@LENGTHOFSTAYDAYS @ Referring MD: @ATDPROV @          AdmissionWeight: 245 lb 12.8 oz (111.494 kg)                 CurrentWeight: 279 lb 6.4 oz (126.735 kg)  CC: lung mass and hemoptysis  HPI: This is a 60 yr old lady came in with increasing sob and spitting up blood. She has been spitting up blood x several weeks. Recently she hoted left parotid gland enlargement, no rash or fever. No known family  history of sarcoidosis or history of TB.  She is a smoker. On w/u noted to have a 1.9 cm infra hilar mass and RLL pneumonia. She was placed on rocephin and zithromax. Her plavix was placed on hold. She also has copd/asthma. Less sob today. No chest pain, leg pain, edema or fever.     :   Past Medical History  Diagnosis Date  . Asthma   . Diabetes mellitus without complication ([place])   . Stroke [facility])     R sided weakness  . MI (myocardial infarction) ([place])   . Arthritis   . Hypertension   . Hypercholesterolemia   . COPD (chronic obstructive pulmonary disease) ([place])   . Shortness of breath dyspnea    Surgical Hx:  Past Surgical History  Procedure Laterality Date  . Cardiac surgery    . Foot surgery      bilateral   Family Hx:  Family History  Problem Relation Age of Onset  . Diabetes Other   . Hypertension Other    Social Hx:   Social History  Substance Use Topics  . Smoking status: Current Every Day Smoker    Types: Cigarettes  . Smokeless tobacco: None  . Alcohol Use: Yes   Medication:    Home Medication:  No current outpatient prescriptions on file.  Current Medication: @CURMEDTAB @   Allergies:   Keppra and Lisinopril  Review of Systems: Gen:  Denies  fever, sweats, chills HEENT: Denies blurred vision, double vision, ear pain, eye pain, hearing loss, nose bleeds, sore throat Cvc:  No dizziness, chest pain or heaviness Resp:   Less sob, still has hemoptysis x several weeks  GI: No swallowing difficulty, stomach pain, nausea or vomiting, diarrhea, constipation, bowel incontinence Gu:  Denies bladder incontinence, burning urine Ext:   No Joint pain, stiffness or swelling Skin: No skin rash, easy bruising or bleeding or hives Endoc:  No polyuria, polydipsia , polyphagia or weight change Psych: No depression, insomnia or hallucinations  Other:  All other systems negative  Physical Examination:   VS: BP 184/86 mmHg  Pulse 83  Temp(Src) 98.3 F (36.8 C) (Oral)  Resp 19  Ht 5\' 4"  (1.626 m)  Wt 279 lb 6.4 oz (126.735 kg)  BMI 47.94 kg/m2  SpO2 97%  General Appearance: No distress  Neuro: without focal findings, mental status, speech normal, alert and oriented, cranial nerves 2-12 intact, reflexes normal and symmetric, sensation grossly normal  HEENT: PERRLA, EOM  intact, no ptosis, no other lesions noticed, Mallampati: Pulmonary:.No wheezing, No rales  Sputum Production:   Cardiovascular:  Normal S1,S2.  No m/r/g.  Abdominal aorta pulsation normal.    Abdomen:Benign, Soft, non-tender, No masses, hepatosplenomegaly, No lymphadenopathy Endoc: No evident thyromegaly, no signs of acromegaly or Cushing features Skin:   warm, no rashes, no ecchymosis  Extremities: normal, no cyanosis, clubbing, no edema, warm with normal capillary refill. Other findings:   Labs results:   Recent Labs     04/10/15  0347  HGB  9.5*  HCT  30.4*  MCV  79.0*  WBC  14.7*  ,    Rad results:  EXAM: CT CHEST WITHOUT CONTRAST  TECHNIQUE: Multidetector CT imaging of the chest was performed following the standard protocol without IV contrast.  COMPARISON: 04/04/2015  FINDINGS: No abnormal  mediastinal adenopathy. Three vessel coronary artery calcifications.  Tiny pleural effusions. No pneumothorax.  Central right lung mass is suspected inferior to the right hilum on image 31 measuring 1.9 cm.  Right lower lobe airspace disease has improved. There is some residual heterogeneous opacities. Dependent atelectasis at the left base.  No acute bony deformity. C6-7 degenerative disc disease with posterior osteophytes.  IMPRESSION: Central lung mass inferior to the right hilum is suspected measuring 1.9 cm. Contrast enhanced CT is recommended.  Improved right lower lobe airspace disease. Continued followup until complete resolution is recommended.  Tiny pleural effusions are developed.   Electronically Signed  By: Marybelle Killings M.D.  On: 04/09/2015 14:37   EKG:     Other:   Assessment and Plan: Right central mass with hemoptysis and right lower lung pneumonia. Concern of a malignant cause, However, sarcoid, reactive node etc is part of the differential.   -Agree with holding plavix for at least 5 five days -ace level -Subsequently bronch/ebus -Agree with steriods, antibiotics, cough suppression  COPD/asthma  -Solumedrol -albuterol/anticholinergic/ics  -Following    I have personally obtained a history, examined the patient, evaluated laboratory and imaging results, formulated the assessment and plan and placed orders.  The Patient requires high complexity decision making for assessment and support, frequent evaluation and titration of therapies, application of advanced monitoring technologies and extensive interpretation of multiple databases.   Herbon Fleming,M.D. Pulmonary & Critical care Medicine [facility]

## 2015-04-12 LAB — BASIC METABOLIC PANEL
ANION GAP: 8 (ref 5–15)
BUN: 36 mg/dL — ABNORMAL HIGH (ref 6–20)
CALCIUM: 9.1 mg/dL (ref 8.9–10.3)
CO2: 32 mmol/L (ref 22–32)
Chloride: 96 mmol/L — ABNORMAL LOW (ref 101–111)
Creatinine, Ser: 1.31 mg/dL — ABNORMAL HIGH (ref 0.44–1.00)
GFR, EST AFRICAN AMERICAN: 51 mL/min — AB (ref >60)
GFR, EST NON AFRICAN AMERICAN: 44 mL/min — AB (ref >60)
Glucose, Bld: 269 mg/dL — ABNORMAL HIGH (ref 65–99)
POTASSIUM: 4.5 mmol/L (ref 3.5–5.1)
Sodium: 136 mmol/L (ref 135–145)

## 2015-04-12 LAB — GLUCOSE, CAPILLARY
GLUCOSE-CAPILLARY: 248 mg/dL — AB (ref 65–99)
GLUCOSE-CAPILLARY: 405 mg/dL — AB (ref 65–99)
Glucose-Capillary: 256 mg/dL — ABNORMAL HIGH (ref 65–99)
Glucose-Capillary: 228 mg/dL — ABNORMAL HIGH (ref 65–99)

## 2015-04-12 LAB — CBC
HEMATOCRIT: 31.6 % — AB (ref 35.0–47.0)
HEMOGLOBIN: 9.8 g/dL — AB (ref 12.0–16.0)
MCH: 24.4 pg — ABNORMAL LOW (ref 26.0–34.0)
MCHC: 31.2 g/dL — AB (ref 32.0–36.0)
MCV: 78.1 fL — ABNORMAL LOW (ref 80.0–100.0)
Platelets: 340 10*3/uL (ref 150–440)
RBC: 4.04 MIL/uL (ref 3.80–5.20)
RDW: 18 % — ABNORMAL HIGH (ref 11.5–14.5)
WBC: 14.5 10*3/uL — AB (ref 3.6–11.0)

## 2015-04-12 MED ORDER — NYSTATIN 100000 UNIT/ML MT SUSP
5.0000 mL | Freq: Four times a day (QID) | OROMUCOSAL | Status: DC
  Administered 2015-04-12 – 2015-04-15 (×13): 500000 [IU] via ORAL
  Filled 2015-04-12 (×13): qty 5

## 2015-04-12 MED ORDER — METHYLPREDNISOLONE SODIUM SUCC 40 MG IJ SOLR
40.0000 mg | Freq: Two times a day (BID) | INTRAMUSCULAR | Status: DC
  Administered 2015-04-12 – 2015-04-14 (×4): 40 mg via INTRAVENOUS
  Filled 2015-04-12 (×5): qty 1

## 2015-04-12 NOTE — Progress Notes (Signed)
Patient ID: Christine Sims, female   DOB: February 02, 1956, 60 y.o.   MRN: [id number] [facility] PROGRESS NOTE  Christine Sims [id number] DOB: 10/20/1955 DOA: 04/04/2015 PCP: Philis Fendt, MD  HPI/Subjective: Patient still not feeling well. The nurse called me this morning that she was a little bit confused and required oxygen overnight. Patient feels a little bit better with regards to her breathing. She still is coughing up some blood.  Objective: Filed Vitals:   04/12/15 0313 04/12/15 0742  BP: 115/69 137/81  Pulse: 77 74  Temp: 98.1 F (36.7 C) 98.2 F (36.8 C)  Resp: 18 20    Filed Weights   04/04/15 1159 04/04/15 1733  Weight: 111.494 kg (245 lb 12.8 oz) 126.735 kg (279 lb 6.4 oz)    ROS: Review of Systems  Constitutional: Negative for fever and chills.  Eyes: Negative for blurred vision.  Respiratory: Positive for cough, hemoptysis, shortness of breath and wheezing.   Cardiovascular: Negative for chest pain.  Gastrointestinal: Negative for nausea, vomiting, abdominal pain, diarrhea and constipation.  Genitourinary: Negative for dysuria.  Musculoskeletal: Negative for joint pain.  Neurological: Negative for dizziness and headaches.   Exam: Physical Exam  Constitutional: She is oriented to person, place, and time.  HENT:  Nose: No mucosal edema.  Mouth/Throat: No oropharyngeal exudate or posterior oropharyngeal edema.  Thrush seen over the tongue and in the mouth.  Eyes: Conjunctivae, EOM and lids are normal. Pupils are equal, round, and reactive to light.  Neck: No JVD present. Carotid bruit is not present. No edema present. No thyroid mass and no thyromegaly present.  Cardiovascular: S1 normal and S2 normal.  Exam reveals no gallop.   No murmur heard. Pulses:      Dorsalis pedis pulses are 2+ on the right side, and 2+ on the left side.  Respiratory: No respiratory distress. She has decreased breath sounds in the right middle field, the right  lower field, the left middle field and the left lower field. She has wheezes in the right middle field, the right lower field, the left middle field and the left lower field. She has no rhonchi. She has no rales.  GI: Soft. Bowel sounds are normal. There is no tenderness.  Musculoskeletal:       Right ankle: She exhibits swelling.       Left ankle: She exhibits swelling.  Lymphadenopathy:    She has no cervical adenopathy.  Neurological: She is alert and oriented to person, place, and time. No cranial nerve deficit.  Skin: Skin is warm. No rash noted. Nails show no clubbing.  Psychiatric: She has a normal mood and affect.      Data Reviewed: Basic Metabolic Panel:  Recent Labs Lab 04/06/15 0437 04/08/15 0601 04/12/15 0418  NA 140 136 136  K 4.0 4.4 4.5  CL 108 101 96*  CO2 24 24 32  GLUCOSE 259* 452* 269*  BUN 44* 32* 36*  CREATININE 1.52* 1.42* 1.31*  CALCIUM 8.8* 9.5 9.1   CBC:  Recent Labs Lab 04/06/15 1338 04/08/15 0601 04/10/15 0347 04/12/15 0418  WBC  --  11.0 14.7* 14.5*  HGB 8.3* 9.5* 9.5* 9.8*  HCT  --  28.8* 30.4* 31.6*  MCV  --  78.3* 79.0* 78.1*  PLT  --  426 411 340   BNP (last 3 results)  Recent Labs  04/04/15 1220  BNP 121.0*    CBG:  Recent Labs Lab 04/11/15 1126 04/11/15 1605 04/11/15 2055 04/12/15 [id number]  04/12/15 1050  GLUCAP 256* 412* 250* 248* 405*    Recent Results (from the past 240 hour(s))  Culture, blood (routine x 2)     Status: None   Collection Time: 04/04/15  2:02 PM  Result Value Ref Range Status   Specimen Description BLOOD LEFT HAND  Final   Special Requests BOTTLES DRAWN AEROBIC AND ANAEROBIC  1CC  Final   Culture NO GROWTH 5 DAYS  Final   Report Status 04/09/2015 FINAL  Final  Culture, blood (routine x 2)     Status: None   Collection Time: 04/04/15  2:02 PM  Result Value Ref Range Status   Specimen Description BLOOD RIGHT HAND  Final   Special Requests BOTTLES DRAWN AEROBIC AND ANAEROBIC  1CC  Final    Culture NO GROWTH 5 DAYS  Final   Report Status 04/09/2015 FINAL  Final  Culture, expectorated sputum-assessment     Status: None   Collection Time: 04/04/15  5:27 PM  Result Value Ref Range Status   Specimen Description EXPECTORATED SPUTUM  Final   Special Requests NONE  Final   Sputum evaluation THIS SPECIMEN IS ACCEPTABLE FOR SPUTUM CULTURE  Final   Report Status 04/04/2015 FINAL  Final  Culture, respiratory (NON-Expectorated)     Status: None   Collection Time: 04/04/15  5:27 PM  Result Value Ref Range Status   Specimen Description EXPECTORATED SPUTUM  Final   Special Requests NONE Reflexed from [id number]  Final   Gram Stain   Final    FAIR SPECIMEN - 70-80% WBCS MODERATE WBC SEEN MODERATE GRAM POSITIVE COCCI IN PAIRS IN CLUSTERS FEW GRAM NEGATIVE COCCOBACILLI RARE GRAM NEGATIVE RODS    Culture Consistent with normal respiratory flora.  Final   Report Status 04/07/2015 FINAL  Final      Scheduled Meds: . amLODipine  10 mg Oral Daily  . atorvastatin  80 mg Oral QHS  . budesonide (PULMICORT) nebulizer solution  0.25 mg Nebulization BID  . busPIRone  30 mg Oral BID  . cefTRIAXone (ROCEPHIN)  IV  1 g Intravenous Q24H  . cloNIDine  0.2 mg Oral BID  . gabapentin  900 mg Oral QHS  . losartan  100 mg Oral Daily   And  . hydrochlorothiazide  25 mg Oral Daily  . insulin aspart  0-20 Units Subcutaneous TID WC  . insulin aspart  0-5 Units Subcutaneous QHS  . insulin aspart  25 Units Subcutaneous TID WC  . insulin glargine  85 Units Subcutaneous BID  . ipratropium-albuterol  3 mL Nebulization Q4H  . methylPREDNISolone (SOLU-MEDROL) injection  40 mg Intravenous Q12H  . metoprolol succinate  100 mg Oral Daily  . nystatin  5 mL Oral QID  . pantoprazole  40 mg Oral Daily  . sertraline  100 mg Oral Daily  . traZODone  200 mg Oral QHS    Assessment/Plan:  1. COPD exacerbation with pneumonia and ongoing tobacco abuse. Try to decrease IV Solu-Medrol 40 mg IV every 12 hours. On  budesonide nebulizer and DuoNeb. Patient is on Rocephin and finished Zithromax 2. Community-acquired pneumonia right lower lobe. On IV Rocephin and finished Zithromax 3. Hemoptysis likely secondary to pneumonia. CT scan of the chest showing a 1.9 cm right lung mass.  Stopped Plavix. Case discussed with Dr. Raul Del pulmonology and he will speak with another pulmonologist about getting a bronchoscopy and biopsy while here. 4. Type 2 diabetes with hyperglycemia. Blood sugars elevated with steroids 5. History of CVA. Plavix stopped with hemoptysis.  Continue statin 6. Essential hypertension continue usual meds 7. Anxiety depression continue usual medication 8. Vasovagal cough syncope 9. Weakness- appreciate physical therapy evaluation 10. Thrush- nystatin swish and swallow  Code Status:     Code Status Orders        Start     Ordered   04/04/15 1514  Full code   Continuous     04/04/15 1513    Code Status History    Date Active Date Inactive Code Status Order ID Comments User Context   This patient has a current code status but no historical code status.     Family Communication: Spoke with daughter on the phone Disposition Plan: Home once breathing better  Consultants:  Pulmonary  Antibiotics:  Rocephin  Time spent: 24 minutes  Loletha Grayer  [facility]

## 2015-04-12 NOTE — Progress Notes (Signed)
Inpatient Diabetes Program Recommendations  AACE/ADA: New Consensus Statement on Inpatient Glycemic Control (2015)  Target Ranges:  Prepandial:   less than 140 mg/dL      Peak postprandial:   less than 180 mg/dL (1-2 hours)      Critically ill patients:  140 - 180 mg/dL   Review of Glycemic Control:  Results for Christine Sims, Christine Sims (MRN [id number]) as of 04/12/2015 14:40  Ref. Range 04/11/2015 11:26 04/11/2015 16:05 04/11/2015 20:55 04/12/2015 07:43 04/12/2015 10:50  Glucose-Capillary Latest Ref Range: 65-99 mg/dL 256 (H) 412 (H) 250 (H) 248 (H) 405 (H)   Inpatient Diabetes Program Recommendations:   Current Diabetes Medications in Hospital:  Lantus 85 units bid, Novolog resistant tid with meals and HS, and Novolog 25 units tid with meals  May consider increasing frequency of blood sugars to q 4 hours with Novolog resistant correction while patient is on IV steroids.   Will follow.  Thanks, Adah Perl, RN, BC-ADM Inpatient Diabetes Coordinator Pager [phone] (8a-5p)

## 2015-04-12 NOTE — Progress Notes (Signed)
Blood sugar is 404.  Dr Leslye Peer said to give 19 as already scheduled for blood sugar up to 404

## 2015-04-13 ENCOUNTER — Inpatient Hospital Stay: Payer: Medicaid Other

## 2015-04-13 LAB — GLUCOSE, CAPILLARY
GLUCOSE-CAPILLARY: 269 mg/dL — AB (ref 65–99)
GLUCOSE-CAPILLARY: 230 mg/dL — AB (ref 65–99)
GLUCOSE-CAPILLARY: 128 mg/dL — AB (ref 65–99)
Glucose-Capillary: 271 mg/dL — ABNORMAL HIGH (ref 65–99)
Glucose-Capillary: 243 mg/dL — ABNORMAL HIGH (ref 65–99)

## 2015-04-13 MED ORDER — IPRATROPIUM-ALBUTEROL 0.5-2.5 (3) MG/3ML IN SOLN
3.0000 mL | Freq: Four times a day (QID) | RESPIRATORY_TRACT | Status: DC
  Administered 2015-04-14 – 2015-04-15 (×3): 3 mL via RESPIRATORY_TRACT
  Filled 2015-04-13 (×5): qty 3

## 2015-04-13 MED ORDER — IPRATROPIUM-ALBUTEROL 0.5-2.5 (3) MG/3ML IN SOLN: 3.0000 mL | RESPIRATORY_TRACT | Status: DC | PRN

## 2015-04-13 NOTE — Progress Notes (Signed)
Patient ID: Christine Sims, female   DOB: 24-Jul-1955, 60 y.o.   MRN: [id number] [facility] PROGRESS NOTE  Christine Sims [id number] DOB: [birth year] DOA: 04/04/2015 PCP: Philis Fendt, MD  HPI/Subjective: Patient states that her breathing is not quite right but better than when she came in. She is coughing up less blood and it's dark. Still with some wheeze. Does not feel that she is ready to go home yet.  Objective: Filed Vitals:   04/13/15 1146 04/13/15 1506  BP: 140/100 122/71  Pulse: 78 74  Temp: 98.2 F (36.8 C) 98 F (36.7 C)  Resp: 25 20    Filed Weights   04/04/15 1159 04/04/15 1733  Weight: 111.494 kg (245 lb 12.8 oz) 126.735 kg (279 lb 6.4 oz)    ROS: Review of Systems  Constitutional: Negative for fever and chills.  Eyes: Negative for blurred vision.  Respiratory: Positive for cough, hemoptysis, shortness of breath and wheezing.   Cardiovascular: Negative for chest pain.  Gastrointestinal: Negative for nausea, vomiting, abdominal pain, diarrhea and constipation.  Genitourinary: Negative for dysuria.  Musculoskeletal: Negative for joint pain.  Neurological: Positive for headaches. Negative for dizziness.   Exam: Physical Exam  Constitutional: She is oriented to person, place, and time.  HENT:  Nose: No mucosal edema.  Mouth/Throat: No oropharyngeal exudate or posterior oropharyngeal edema.  Thrush seen over the tongue and in the mouth.  Eyes: Conjunctivae, EOM and lids are normal. Pupils are equal, round, and reactive to light.  Neck: No JVD present. Carotid bruit is not present. No edema present. No thyroid mass and no thyromegaly present.  Cardiovascular: S1 normal and S2 normal.  Exam reveals no gallop.   No murmur heard. Pulses:      Dorsalis pedis pulses are 2+ on the right side, and 2+ on the left side.  Respiratory: No respiratory distress. She has decreased breath sounds in the right middle field, the right lower field, the left  middle field and the left lower field. She has wheezes in the right middle field, the right lower field, the left middle field and the left lower field. She has no rhonchi. She has no rales.  GI: Soft. Bowel sounds are normal. There is no tenderness.  Musculoskeletal:       Right ankle: She exhibits swelling.       Left ankle: She exhibits swelling.  Lymphadenopathy:    She has no cervical adenopathy.  Neurological: She is alert and oriented to person, place, and time. No cranial nerve deficit.  Skin: Skin is warm. No rash noted. Nails show no clubbing.  Psychiatric: She has a normal mood and affect.      Data Reviewed: Basic Metabolic Panel:  Recent Labs Lab 04/08/15 0601 04/12/15 0418  NA 136 136  K 4.4 4.5  CL 101 96*  CO2 24 32  GLUCOSE 452* 269*  BUN 32* 36*  CREATININE 1.42* 1.31*  CALCIUM 9.5 9.1   CBC:  Recent Labs Lab 04/08/15 0601 04/10/15 0347 04/12/15 0418  WBC 11.0 14.7* 14.5*  HGB 9.5* 9.5* 9.8*  HCT 28.8* 30.4* 31.6*  MCV 78.3* 79.0* 78.1*  PLT 426 411 340   BNP (last 3 results)  Recent Labs  04/04/15 1220  BNP 121.0*    CBG:  Recent Labs Lab 04/12/15 1050 04/12/15 1540 04/12/15 2105 04/13/15 0727 04/13/15 1102  GLUCAP 405* 256* 228* 269* 271*    Recent Results (from the past 240 hour(s))  Culture, blood (routine x  2)     Status: None   Collection Time: 04/04/15  2:02 PM  Result Value Ref Range Status   Specimen Description BLOOD LEFT HAND  Final   Special Requests BOTTLES DRAWN AEROBIC AND ANAEROBIC  1CC  Final   Culture NO GROWTH 5 DAYS  Final   Report Status 04/09/2015 FINAL  Final  Culture, blood (routine x 2)     Status: None   Collection Time: 04/04/15  2:02 PM  Result Value Ref Range Status   Specimen Description BLOOD RIGHT HAND  Final   Special Requests BOTTLES DRAWN AEROBIC AND ANAEROBIC  1CC  Final   Culture NO GROWTH 5 DAYS  Final   Report Status 04/09/2015 FINAL  Final  Culture, expectorated sputum-assessment      Status: None   Collection Time: 04/04/15  5:27 PM  Result Value Ref Range Status   Specimen Description EXPECTORATED SPUTUM  Final   Special Requests NONE  Final   Sputum evaluation THIS SPECIMEN IS ACCEPTABLE FOR SPUTUM CULTURE  Final   Report Status 04/04/2015 FINAL  Final  Culture, respiratory (NON-Expectorated)     Status: None   Collection Time: 04/04/15  5:27 PM  Result Value Ref Range Status   Specimen Description EXPECTORATED SPUTUM  Final   Special Requests NONE Reflexed from [id number]  Final   Gram Stain   Final    FAIR SPECIMEN - 70-80% WBCS MODERATE WBC SEEN MODERATE GRAM POSITIVE COCCI IN PAIRS IN CLUSTERS FEW GRAM NEGATIVE COCCOBACILLI RARE GRAM NEGATIVE RODS    Culture Consistent with normal respiratory flora.  Final   Report Status 04/07/2015 FINAL  Final      Scheduled Meds: . amLODipine  10 mg Oral Daily  . atorvastatin  80 mg Oral QHS  . budesonide (PULMICORT) nebulizer solution  0.25 mg Nebulization BID  . busPIRone  30 mg Oral BID  . cefTRIAXone (ROCEPHIN)  IV  1 g Intravenous Q24H  . cloNIDine  0.2 mg Oral BID  . gabapentin  900 mg Oral QHS  . losartan  100 mg Oral Daily   And  . hydrochlorothiazide  25 mg Oral Daily  . insulin aspart  0-20 Units Subcutaneous TID WC  . insulin aspart  0-5 Units Subcutaneous QHS  . insulin aspart  25 Units Subcutaneous TID WC  . insulin glargine  85 Units Subcutaneous BID  . ipratropium-albuterol  3 mL Nebulization Q4H  . methylPREDNISolone (SOLU-MEDROL) injection  40 mg Intravenous Q12H  . metoprolol succinate  100 mg Oral Daily  . nystatin  5 mL Oral QID  . pantoprazole  40 mg Oral Daily  . sertraline  100 mg Oral Daily  . traZODone  200 mg Oral QHS    Assessment/Plan:  1. COPD exacerbation with pneumonia and ongoing tobacco abuse. Continue IV Solu-Medrol 40 mg IV every 12 hours. On budesonide nebulizer and DuoNeb. Patient is on Rocephin and finished Zithromax 2. Community-acquired pneumonia right lower lobe. On  IV Rocephin and finished Zithromax 3. Hemoptysis likely secondary to pneumonia. CT scan of the chest showing a 1.9 cm right lung mass.  Stopped Plavix. Likely will need outpatient follow-up and biopsy 4. Type 2 diabetes with hyperglycemia. Blood sugars elevated with steroids 5. History of CVA. Plavix stopped with hemoptysis. Continue statin 6. Essential hypertension continue usual meds 7. Anxiety depression continue usual medication 8. Vasovagal cough syncope 9. Weakness- appreciate physical therapy evaluation 10. Thrush- nystatin swish and swallow 11. Headache - when necessary oxycodone. Ct head  Code  Status:     Code Status Orders        Start     Ordered   04/04/15 1514  Full code   Continuous     04/04/15 1513    Code Status History    Date Active Date Inactive Code Status Order ID Comments User Context   This patient has a current code status but no historical code status.     Family Communication: Spoke family at bedside Disposition Plan: Home once breathing better  Consultants:  Pulmonary  Antibiotics:  Rocephin  Time spent: 24 minutes  Loletha Grayer  [facility] [place] [facility]

## 2015-04-13 NOTE — Progress Notes (Deleted)
Discharged home. Discharge instructions given; acknowledged understanding. 

## 2015-04-13 NOTE — Progress Notes (Signed)
Inpatient Diabetes Program Recommendations  AACE/ADA: New Consensus Statement on Inpatient Glycemic Control (2015)  Target Ranges:  Prepandial:   less than 140 mg/dL      Peak postprandial:   less than 180 mg/dL (1-2 hours)      Critically ill patients:  140 - 180 mg/dL   Review of Glycemic Control  Results for HATSUYE, KNICKREHM (MRN [id number]) as of 04/13/2015 09:34  Ref. Range 04/12/2015 07:43 04/12/2015 10:50 04/12/2015 15:40 04/12/2015 21:05 04/13/2015 07:27  Glucose-Capillary Latest Ref Range: 65-99 mg/dL 248 (H) 405 (H) 256 (H) 228 (H) 269 (H)   Home diabetes medications: Lantus 75 units bid, Humalog 30 units tid Current Diabetes Medications in Hospital: Lantus 85 units bid, Novolog resistant tid with meals and Novolog 0-5 units QHS, and Novolog 25 units tid with meals  Inpatient Diabetes Program Recommendations:   May consider increasing frequency of blood sugars to q 4 hours with Novolog resistant correction while patient is on IV steroids.  Consider increasing Lantus pm dose to 90 units qhs.  Gentry Fitz, RN, BA, [place], CDE Diabetes Coordinator Inpatient Diabetes Program  [phone] (Team Pager) [phone] ([place]) 04/13/2015 9:37 AM

## 2015-04-13 NOTE — Progress Notes (Signed)
Initial Nutrition Assessment   INTERVENTION:   Meals and Snacks: Cater to patient preferences on Heart/Carb Modified diet order   NUTRITION DIAGNOSIS:   No nutrition diagnosis at this time  GOAL:   Patient will meet greater than or equal to 90% of their needs  MONITOR:    (Energy Intake, Electrolyte and renal Profile, Anthropometrics)  REASON FOR ASSESSMENT:   LOS    ASSESSMENT:    Pt admitted with  Community acquired pna with hemoptysis and COPD exacerbation.  Past Medical History  Diagnosis Date  . Asthma   . Diabetes mellitus without complication ([place])   . Stroke [facility])     R sided weakness  . MI (myocardial infarction) ([place])   . Arthritis   . Hypertension   . Hypercholesterolemia   . COPD (chronic obstructive pulmonary disease) ([place])   . Shortness of breath dyspnea     Diet Order:  Diet Heart Room service appropriate?: Yes; Fluid consistency:: Thin    Current Nutrition: Recorded po intake on average 67% of meals since admission. RD notes pt eating [place] trays and crackers at night multiple nights.   Food/Nutrition-Related History: Per MST no decrease in appetite PTA   Scheduled Medications:  . amLODipine  10 mg Oral Daily  . atorvastatin  80 mg Oral QHS  . budesonide (PULMICORT) nebulizer solution  0.25 mg Nebulization BID  . busPIRone  30 mg Oral BID  . cefTRIAXone (ROCEPHIN)  IV  1 g Intravenous Q24H  . cloNIDine  0.2 mg Oral BID  . gabapentin  900 mg Oral QHS  . losartan  100 mg Oral Daily   And  . hydrochlorothiazide  25 mg Oral Daily  . insulin aspart  0-20 Units Subcutaneous TID WC  . insulin aspart  0-5 Units Subcutaneous QHS  . insulin aspart  25 Units Subcutaneous TID WC  . insulin glargine  85 Units Subcutaneous BID  . ipratropium-albuterol  3 mL Nebulization Q4H  . methylPREDNISolone (SOLU-MEDROL) injection  40 mg Intravenous Q12H  . metoprolol succinate  100 mg Oral Daily  . nystatin  5 mL Oral QID  . pantoprazole  40 mg Oral Daily   . sertraline  100 mg Oral Daily  . traZODone  200 mg Oral QHS    Electrolyte/Renal Profile and Glucose Profile:   Recent Labs Lab 04/08/15 0601 04/12/15 0418  NA 136 136  K 4.4 4.5  CL 101 96*  CO2 24 32  BUN 32* 36*  CREATININE 1.42* 1.31*  CALCIUM 9.5 9.1  GLUCOSE 452* 269*   Protein Profile: No results for input(s): ALBUMIN in the last 168 hours.  Gastrointestinal Profile: Last BM:  04/12/2015   Weight Change: Pt with weight gain PTA per [facility] weight encounters   Skin:  Reviewed, no issues   Height:   Ht Readings from Last 1 Encounters:  04/04/15 5\' 4"  (1.626 m)    Weight:   Wt Readings from Last 1 Encounters:  04/04/15 279 lb 6.4 oz (126.735 kg)    BMI:  Body mass index is 47.94 kg/(m^2).   EDUCATION NEEDS:   No education needs identified at this time   Taylor, RD, LDN Pager [phone] Weekend/On-Call Pager [phone]

## 2015-04-14 LAB — GLUCOSE, CAPILLARY
GLUCOSE-CAPILLARY: 367 mg/dL — AB (ref 65–99)
GLUCOSE-CAPILLARY: 272 mg/dL — AB (ref 65–99)
GLUCOSE-CAPILLARY: 150 mg/dL — AB (ref 65–99)
Glucose-Capillary: 284 mg/dL — ABNORMAL HIGH (ref 65–99)
Glucose-Capillary: 171 mg/dL — ABNORMAL HIGH (ref 65–99)

## 2015-04-14 MED ORDER — ACETYLCYSTEINE 20 % IN SOLN
3.0000 mL | Freq: Two times a day (BID) | RESPIRATORY_TRACT | Status: DC
  Administered 2015-04-14: 3 mL via RESPIRATORY_TRACT
  Filled 2015-04-14: qty 4

## 2015-04-14 MED ORDER — PREDNISONE 20 MG PO TABS
40.0000 mg | ORAL_TABLET | Freq: Every day | ORAL | Status: DC
  Administered 2015-04-15: 40 mg via ORAL
  Filled 2015-04-14: qty 2

## 2015-04-14 MED ORDER — ACETYLCYSTEINE 20 % IN SOLN
3.0000 mL | Freq: Two times a day (BID) | RESPIRATORY_TRACT | Status: DC
  Administered 2015-04-15: 3 mL via RESPIRATORY_TRACT
  Filled 2015-04-14 (×3): qty 4

## 2015-04-14 NOTE — Progress Notes (Signed)
Patient ID: Christine Sims, female   DOB: [birth year], 60 y.o.   MRN: [id number] [facility] PROGRESS NOTE  Christine Sims [id number] DOB: May 17, 1955 DOA: 04/04/2015 PCP: Philis Fendt, MD  HPI/Subjective: Patient still coughing up a little dark blood. Still short of breath and wheezing. Headache is improved.  Objective: Filed Vitals:   04/14/15 0509 04/14/15 0707  BP: 139/75 145/74  Pulse: 70 82  Temp: 97.9 F (36.6 C) 97.8 F (36.6 C)  Resp: 18     Filed Weights   04/04/15 1159 04/04/15 1733  Weight: 111.494 kg (245 lb 12.8 oz) 126.735 kg (279 lb 6.4 oz)    ROS: Review of Systems  Constitutional: Negative for fever and chills.  Eyes: Negative for blurred vision.  Respiratory: Positive for cough, hemoptysis, shortness of breath and wheezing.   Cardiovascular: Negative for chest pain.  Gastrointestinal: Negative for nausea, vomiting, abdominal pain, diarrhea and constipation.  Genitourinary: Negative for dysuria.  Musculoskeletal: Negative for joint pain.  Neurological: Negative for dizziness and headaches.   Exam: Physical Exam  Constitutional: She is oriented to person, place, and time.  HENT:  Nose: No mucosal edema.  Mouth/Throat: No oropharyngeal exudate or posterior oropharyngeal edema.  Thrush seen over the tongue and in the mouth.  Eyes: Conjunctivae, EOM and lids are normal. Pupils are equal, round, and reactive to light.  Neck: No JVD present. Carotid bruit is not present. No edema present. No thyroid mass and no thyromegaly present.  Cardiovascular: S1 normal and S2 normal.  Exam reveals no gallop.   No murmur heard. Pulses:      Dorsalis pedis pulses are 2+ on the right side, and 2+ on the left side.  Respiratory: No respiratory distress. She has decreased breath sounds in the right middle field, the right lower field, the left middle field and the left lower field. She has wheezes in the right middle field, the right lower field, the  left middle field and the left lower field. She has no rhonchi. She has no rales.  GI: Soft. Bowel sounds are normal. There is no tenderness.  Musculoskeletal:       Right ankle: She exhibits swelling.       Left ankle: She exhibits swelling.  Lymphadenopathy:    She has no cervical adenopathy.  Neurological: She is alert and oriented to person, place, and time. No cranial nerve deficit.  Skin: Skin is warm. No rash noted. Nails show no clubbing.  Psychiatric: She has a normal mood and affect.      Data Reviewed: Basic Metabolic Panel:  Recent Labs Lab 04/08/15 0601 04/12/15 0418  NA 136 136  K 4.4 4.5  CL 101 96*  CO2 24 32  GLUCOSE 452* 269*  BUN 32* 36*  CREATININE 1.42* 1.31*  CALCIUM 9.5 9.1   CBC:  Recent Labs Lab 04/08/15 0601 04/10/15 0347 04/12/15 0418  WBC 11.0 14.7* 14.5*  HGB 9.5* 9.5* 9.8*  HCT 28.8* 30.4* 31.6*  MCV 78.3* 79.0* 78.1*  PLT 426 411 340   BNP (last 3 results)  Recent Labs  04/04/15 1220  BNP 121.0*    CBG:  Recent Labs Lab 04/13/15 1631 04/13/15 2114 04/14/15 0705 04/14/15 1035 04/14/15 1102  GLUCAP 243* 128* 367* 284* 272*    Recent Results (from the past 240 hour(s))  Culture, expectorated sputum-assessment     Status: None   Collection Time: 04/04/15  5:27 PM  Result Value Ref Range Status   Specimen Description EXPECTORATED  SPUTUM  Final   Special Requests NONE  Final   Sputum evaluation THIS SPECIMEN IS ACCEPTABLE FOR SPUTUM CULTURE  Final   Report Status 04/04/2015 FINAL  Final  Culture, respiratory (NON-Expectorated)     Status: None   Collection Time: 04/04/15  5:27 PM  Result Value Ref Range Status   Specimen Description EXPECTORATED SPUTUM  Final   Special Requests NONE Reflexed from [id number]  Final   Gram Stain   Final    FAIR SPECIMEN - 70-80% WBCS MODERATE WBC SEEN MODERATE GRAM POSITIVE COCCI IN PAIRS IN CLUSTERS FEW GRAM NEGATIVE COCCOBACILLI RARE GRAM NEGATIVE RODS    Culture Consistent with  normal respiratory flora.  Final   Report Status 04/07/2015 FINAL  Final      Scheduled Meds: . acetylcysteine  3 mL Nebulization BID  . amLODipine  10 mg Oral Daily  . atorvastatin  80 mg Oral QHS  . budesonide (PULMICORT) nebulizer solution  0.25 mg Nebulization BID  . busPIRone  30 mg Oral BID  . cloNIDine  0.2 mg Oral BID  . gabapentin  900 mg Oral QHS  . losartan  100 mg Oral Daily   And  . hydrochlorothiazide  25 mg Oral Daily  . insulin aspart  0-20 Units Subcutaneous TID WC  . insulin aspart  0-5 Units Subcutaneous QHS  . insulin aspart  25 Units Subcutaneous TID WC  . insulin glargine  85 Units Subcutaneous BID  . ipratropium-albuterol  3 mL Nebulization QID  . metoprolol succinate  100 mg Oral Daily  . nystatin  5 mL Oral QID  . pantoprazole  40 mg Oral Daily  . [START ON 04/15/2015] predniSONE  40 mg Oral Q breakfast  . sertraline  100 mg Oral Daily  . traZODone  200 mg Oral QHS    Assessment/Plan:  1. COPD exacerbation with pneumonia and ongoing tobacco abuse. Switched to oral prednisone 40 mg daily for tomorrow. On budesonide nebulizer and DuoNeb. Finished antibiotic. Anticipate discharge tomorrow morning. 2. Community-acquired pneumonia right lower lobe. Finished antibiotics 3. Hemoptysis likely secondary to pneumonia. CT scan of the chest showing a 1.9 cm right lung mass.  Stopped Plavix. Likely will need outpatient follow-up and biopsy.  4. Type 2 diabetes with hyperglycemia. Blood sugars elevated with steroids 5. History of CVA. Plavix stopped with hemoptysis. Continue statin 6. Essential hypertension continue usual meds 7. Anxiety depression continue usual medication 8. Vasovagal cough syncope 9. Weakness- appreciate physical therapy evaluation 10. Thrush- nystatin swish and swallow 11. Headache - when necessary oxycodone. Ct head  Code Status:     Code Status Orders        Start     Ordered   04/04/15 1514  Full code   Continuous     04/04/15 1513     Code Status History    Date Active Date Inactive Code Status Order ID Comments User Context   This patient has a current code status but no historical code status.     Family Communication: Spoke family at bedside Disposition Plan: Home likely tomorrow morning  Consultants:  Pulmonary  Antibiotics:  Patient finished complete course of Rocephin and Zithromax while here  Time spent: 22 minutes  Loletha Grayer  [facility] [place] [facility]

## 2015-04-15 LAB — GLUCOSE, CAPILLARY
GLUCOSE-CAPILLARY: 111 mg/dL — AB (ref 65–99)
GLUCOSE-CAPILLARY: 63 mg/dL — AB (ref 65–99)

## 2015-04-15 MED ORDER — INSULIN GLARGINE 100 UNIT/ML ~~LOC~~ SOLN
75.0000 [IU] | Freq: Two times a day (BID) | SUBCUTANEOUS | Status: DC
Start: 2015-04-15 — End: 2015-04-15
  Administered 2015-04-15: 75 [IU] via SUBCUTANEOUS
  Filled 2015-04-15 (×2): qty 0.75

## 2015-04-15 MED ORDER — CLONIDINE HCL 0.1 MG PO TABS: 0.1000 mg | ORAL_TABLET | Freq: Two times a day (BID) | ORAL | Status: DC

## 2015-04-15 MED ORDER — ALBUTEROL SULFATE (2.5 MG/3ML) 0.083% IN NEBU: 2.5000 mg | INHALATION_SOLUTION | RESPIRATORY_TRACT | Status: DC | PRN

## 2015-04-15 MED ORDER — INSULIN ASPART 100 UNIT/ML ~~LOC~~ SOLN
10.0000 [IU] | Freq: Three times a day (TID) | SUBCUTANEOUS | Status: DC
  Filled 2015-04-15: qty 10

## 2015-04-15 MED ORDER — METOPROLOL SUCCINATE ER 25 MG PO TB24: 25.0000 mg | ORAL_TABLET | Freq: Every day | ORAL | Status: DC

## 2015-04-15 MED ORDER — OXYCODONE HCL 5 MG PO TABS: 5.0000 mg | ORAL_TABLET | ORAL | Status: DC | PRN

## 2015-04-15 MED ORDER — NYSTATIN 100000 UNIT/ML MT SUSP: 5.0000 mL | Freq: Four times a day (QID) | OROMUCOSAL | Status: DC

## 2015-04-15 MED ORDER — BUDESONIDE-FORMOTEROL FUMARATE 160-4.5 MCG/ACT IN AERO: 2.0000 | INHALATION_SPRAY | Freq: Two times a day (BID) | RESPIRATORY_TRACT | Status: AC

## 2015-04-15 MED ORDER — DOXYCYCLINE HYCLATE 100 MG PO TABS: 100.0000 mg | ORAL_TABLET | Freq: Two times a day (BID) | ORAL | Status: DC

## 2015-04-15 MED ORDER — ALBUTEROL SULFATE HFA 108 (90 BASE) MCG/ACT IN AERS: 2.0000 | INHALATION_SPRAY | RESPIRATORY_TRACT | Status: AC | PRN

## 2015-04-15 MED ORDER — DOXYCYCLINE HYCLATE 100 MG PO TABS
100.0000 mg | ORAL_TABLET | Freq: Two times a day (BID) | ORAL | Status: DC
  Administered 2015-04-15: 100 mg via ORAL
  Filled 2015-04-15: qty 1

## 2015-04-15 MED ORDER — PREDNISONE 10 MG PO TABS: ORAL_TABLET | ORAL | Status: DC

## 2015-04-15 NOTE — Progress Notes (Signed)
Inpatient Diabetes Program Recommendations  AACE/ADA: New Consensus Statement on Inpatient Glycemic Control (2015)  Target Ranges:  Prepandial:   less than 140 mg/dL      Peak postprandial:   less than 180 mg/dL (1-2 hours)      Critically ill patients:  140 - 180 mg/dL   Review of Glycemic Control  Home diabetes medications: Lantus 75 units bid, Humalog 30 units tid Current Diabetes Medications in Hospital: Lantus 75 units bid, Novolog resistant 0-20 units tid with meals and Novolog 0-5 units QHS, Novolog 10 units tid with meals Inpatient Diabetes Program Recommendations:   Noted- MD made recent change in Novolog insulin mealtime dose to 10 units Novolog tid because of am low.   Gentry Fitz, RN, BA, MHA, CDE Diabetes Coordinator Inpatient Diabetes Program  [phone] (Team Pager) [phone] ([place]) 04/15/2015 9:31 AM

## 2015-04-15 NOTE — Discharge Summary (Signed)
[place] [facility] [place] NAME: Christine Sims    MR#:  [id number]  DATE OF BIRTH:  [birth year]  DATE OF ADMISSION:  04/04/2015 ADMITTING PHYSICIAN: Lytle Butte, MD  DATE OF DISCHARGE: 04/15/2015  PRIMARY CARE PHYSICIAN: Philis Fendt, MD    ADMISSION DIAGNOSIS:  Shortness of breath [R06.02] Right lower lobe pneumonia [J18.9] Chest pain, unspecified chest pain type [R07.9] Acute renal failure, unspecified acute renal failure type ([place]) [N17.9]  DISCHARGE DIAGNOSIS:  Principal Problem:   Community acquired pneumonia   SECONDARY DIAGNOSIS:   Past Medical History  Diagnosis Date  . Asthma   . Diabetes mellitus without complication ([place])   . Stroke [facility])     R sided weakness  . MI (myocardial infarction) ([place])   . Arthritis   . Hypertension   . Hypercholesterolemia   . COPD (chronic obstructive pulmonary disease) ([place])   . Shortness of breath dyspnea     HOSPITAL COURSE:   1. COPD exacerbation with pneumonia and ongoing tobacco abuse. The patient continued to have wheeze during the entire hospital course. At the time of discharge, when she is breathing through her nose I do not hear the wheeze. The means this is an upper airway transmitted wheeze. When she breathes through her mouth you can hear an audible wheeze. I will switch antibiotics to doxycycline upon going home. Give a prednisone taper upon going home. I wrote scripts for nebulizers and inhalers. The patient is moving better air today. I explained that this is on upper airway wheeze and can be treated as outpatient and she will get better. 2. Community-acquired pneumonia right lower lobe. Patient finished Rocephin and Zithromax here. I will prescribe doxycycline upon going home. 3. Hemoptysis. Likely secondary to pneumonia. I stop Plavix. 4. 1.9 cm lung mass pulmonary nodule. I stop the Plavix. Will follow-up with Dr. Raul Del to set out outpatient PET scan and biopsy 5.  Type 2 diabetes with hyperglycemia blood sugars were elevated with steroids. Since I am cutting back on the steroids at this point I will go back to her usual diabetes management at home. 6. Essential hypertension- blood pressure little bit on the lower side this morning I will cut back to clonidine to 0.1 mg twice day cut back the Toprol-XL to 25 mg daily and continue her other medications 7. Anxiety and depression continue usual medications. Patient is nervous about going home. 8. Vasovagal cough syncope 9. Weakness- physical therapy stated the patient doesn't have any home health needs 10. Thrush nystatin swish and swallow 11. Headache when necessary oxycodone   DISCHARGE CONDITIONS:   Satisfactory  CONSULTS OBTAINED:  Treatment Team:  Lytle Butte, MD Erby Pian, MD  DRUG ALLERGIES:   Allergies  Allergen Reactions  . Keppra [Levetiracetam]     Anaphylaxis   . Lisinopril     anaphylaxis    DISCHARGE MEDICATIONS:   Current Discharge Medication List    START taking these medications   Details  budesonide-formoterol (SYMBICORT) 160-4.5 MCG/ACT inhaler Inhale 2 puffs into the lungs 2 (two) times daily. Qty: 1 Inhaler, Refills: 0    cloNIDine (CATAPRES) 0.1 MG tablet Take 1 tablet (0.1 mg total) by mouth 2 (two) times daily. Qty: 60 tablet, Refills: 0    doxycycline (VIBRA-TABS) 100 MG tablet Take 1 tablet (100 mg total) by mouth every 12 (twelve) hours. Qty: 20 tablet, Refills: 0    nystatin (MYCOSTATIN) 100000 UNIT/ML suspension Take 5 mLs (500,000 Units  total) by mouth 4 (four) times daily. Qty: 60 mL, Refills: 0    oxyCODONE (OXY IR/ROXICODONE) 5 MG immediate release tablet Take 1 tablet (5 mg total) by mouth every 4 (four) hours as needed for moderate pain. Qty: 15 tablet, Refills: 0    predniSONE (DELTASONE) 10 MG tablet Take 4 tabs day 1; take 3 tabs day 2, 3; Take 2 tabs day4,5; Take 1 tab day 6, 7; Take 1/2 tab day8,9 then stop Qty: 17 tablet, Refills:  0      CONTINUE these medications which have CHANGED   Details  albuterol (PROVENTIL HFA;VENTOLIN HFA) 108 (90 Base) MCG/ACT inhaler Inhale 2 puffs into the lungs every 4 (four) hours as needed for wheezing or shortness of breath. Qty: 1 Inhaler, Refills: 0    albuterol (PROVENTIL) (2.5 MG/3ML) 0.083% nebulizer solution Take 3 mLs (2.5 mg total) by nebulization every 4 (four) hours as needed. For wheezing. Qty: 75 mL, Refills: 0    metoprolol succinate (TOPROL XL) 25 MG 24 hr tablet Take 1 tablet (25 mg total) by mouth daily. Qty: 30 tablet, Refills: 0      CONTINUE these medications which have NOT CHANGED   Details  amLODipine (NORVASC) 10 MG tablet Take 10 mg by mouth daily.    atorvastatin (LIPITOR) 80 MG tablet Take 80 mg by mouth at bedtime.     busPIRone (BUSPAR) 30 MG tablet Take 30 mg by mouth 2 (two) times daily.    dextrose (GLUTOSE) 40 % gel Take 15 g by mouth once as needed (for low blood sugar).    gabapentin (NEURONTIN) 300 MG capsule Take 900 mg by mouth at bedtime.     insulin glargine (LANTUS) 100 UNIT/ML injection Inject 75 Units into the skin 2 (two) times daily.     insulin lispro (HUMALOG) 100 UNIT/ML injection Inject 30 Units into the skin 3 (three) times daily before meals.    losartan-hydrochlorothiazide (HYZAAR) 100-25 MG per tablet Take 1 tablet by mouth every morning.    omeprazole (PRILOSEC) 40 MG capsule Take 40 mg by mouth daily.    sertraline (ZOLOFT) 100 MG tablet Take 100 mg by mouth daily.    traZODone (DESYREL) 100 MG tablet Take 200 mg by mouth at bedtime.       STOP taking these medications     albuterol-ipratropium (COMBIVENT) 18-103 MCG/ACT inhaler      clopidogrel (PLAVIX) 75 MG tablet          DISCHARGE INSTRUCTIONS:   Follow-up with PMD one week Follow-up with Dr. Raul Del one week  If you experience worsening of your admission symptoms, develop shortness of breath, life threatening emergency, suicidal or homicidal  thoughts you must seek medical attention immediately by calling 911 or calling your MD immediately  if symptoms less severe.  You Must read complete instructions/literature along with all the possible adverse reactions/side effects for all the Medicines you take and that have been prescribed to you. Take any new Medicines after you have completely understood and accept all the possible adverse reactions/side effects.   Please note  You were cared for by a hospitalist during your hospital stay. If you have any questions about your discharge medications or the care you received while you were in the hospital after you are discharged, you can call the unit and asked to speak with the hospitalist on call if the hospitalist that took care of you is not available. Once you are discharged, your primary care physician will handle any further  medical issues. Please note that NO REFILLS for any discharge medications will be authorized once you are discharged, as it is imperative that you return to your primary care physician (or establish a relationship with a primary care physician if you do not have one) for your aftercare needs so that they can reassess your need for medications and monitor your lab values.    Today   CHIEF COMPLAINT:   Chief Complaint  Patient presents with  . Respiratory Distress    HISTORY OF PRESENT ILLNESS:  Christine Sims  is a 60 y.o. female with a known history of COPD   VITAL SIGNS:  Blood pressure 108/55, pulse 65, temperature 97.8 F (36.6 C), temperature source Oral, resp. rate 18, height 5\' 4"  (1.626 m), weight 126.735 kg (279 lb 6.4 oz), SpO2 92 %. Pulse ox with ambulation above 90.  I/O:    PHYSICAL EXAMINATION:  GENERAL:  60 y.o.-year-old patient lying in the bed with no acute distress.  EYES: Pupils equal, round, reactive to light and accommodation. No scleral icterus. Extraocular muscles intact.  HEENT: Head atraumatic, normocephalic. Oropharynx and  nasopharynx clear.  NECK:  Supple, no jugular venous distention. No thyroid enlargement, no tenderness.  LUNGS: Decreased breath sounds bilaterally. When she breathes through her nose her lungs are clear. When she breathes through her mouth I do hear an upper airway transmitted wheeze. No use of accessory muscles of respiration.  CARDIOVASCULAR: S1, S2 normal. No murmurs, rubs, or gallops.  ABDOMEN: Soft, non-tender, non-distended. Bowel sounds present. No organomegaly or mass.  EXTREMITIES: Trace edema, no cyanosis, or clubbing.  NEUROLOGIC: Cranial nerves II through XII are intact. Muscle strength 5/5 in all extremities. Sensation intact. Gait not checked.  PSYCHIATRIC: The patient is alert and oriented x 3.  SKIN: No obvious rash, lesion, or ulcer.   DATA REVIEW:   CBC  Recent Labs Lab 04/12/15 0418  WBC 14.5*  HGB 9.8*  HCT 31.6*  PLT 340    Chemistries   Recent Labs Lab 04/12/15 0418  NA 136  K 4.5  CL 96*  CO2 32  GLUCOSE 269*  BUN 36*  CREATININE 1.31*  CALCIUM 9.1    Microbiology Results  Results for orders placed or performed during the hospital encounter of 04/04/15  Culture, blood (routine x 2)     Status: None   Collection Time: 04/04/15  2:02 PM  Result Value Ref Range Status   Specimen Description BLOOD LEFT HAND  Final   Special Requests BOTTLES DRAWN AEROBIC AND ANAEROBIC  1CC  Final   Culture NO GROWTH 5 DAYS  Final   Report Status 04/09/2015 FINAL  Final  Culture, blood (routine x 2)     Status: None   Collection Time: 04/04/15  2:02 PM  Result Value Ref Range Status   Specimen Description BLOOD RIGHT HAND  Final   Special Requests BOTTLES DRAWN AEROBIC AND ANAEROBIC  1CC  Final   Culture NO GROWTH 5 DAYS  Final   Report Status 04/09/2015 FINAL  Final  Culture, expectorated sputum-assessment     Status: None   Collection Time: 04/04/15  5:27 PM  Result Value Ref Range Status   Specimen Description EXPECTORATED SPUTUM  Final   Special  Requests NONE  Final   Sputum evaluation THIS SPECIMEN IS ACCEPTABLE FOR SPUTUM CULTURE  Final   Report Status 04/04/2015 FINAL  Final  Culture, respiratory (NON-Expectorated)     Status: None   Collection Time: 04/04/15  5:27 PM  Result Value Ref Range Status   Specimen Description EXPECTORATED SPUTUM  Final   Special Requests NONE Reflexed from [id number]  Final   Gram Stain   Final    FAIR SPECIMEN - 70-80% WBCS MODERATE WBC SEEN MODERATE GRAM POSITIVE COCCI IN PAIRS IN CLUSTERS FEW GRAM NEGATIVE COCCOBACILLI RARE GRAM NEGATIVE RODS    Culture Consistent with normal respiratory flora.  Final   Report Status 04/07/2015 FINAL  Final    RADIOLOGY:  Ct Head Wo Contrast  04/13/2015  CLINICAL DATA:  Headaches for the past month. Hypertension. Hypercholesterolemia. EXAM: CT HEAD WITHOUT CONTRAST TECHNIQUE: Contiguous axial images were obtained from the base of the skull through the vertex without intravenous contrast. COMPARISON:  None. FINDINGS: Diffusely enlarged ventricles and subarachnoid spaces. Patchy white matter low density in both cerebral hemispheres. No intracranial hemorrhage, mass lesion or CT evidence of acute infarction. Almost complete opacification of the right maxillary sinus. Unremarkable bones. IMPRESSION: 1. No acute abnormality. 2. Old bilateral white matter infarcts or chronic small vessel ischemic changes. 3. Marked chronic right maxillary sinusitis. Electronically Signed   By: Claudie Revering M.D.   On: 04/13/2015 19:40    Management plans discussed with the patient, family and they are in agreement. Patient has been nervous about going home for the past few days now.   CODE STATUS:     Code Status Orders        Start     Ordered   04/04/15 1514  Full code   Continuous     04/04/15 1513    Code Status History    Date Active Date Inactive Code Status Order ID Comments User Context   This patient has a current code status but no historical code status.      TOTAL  TIME TAKING CARE OF THIS PATIENT: 45  minutes.    Loletha Grayer M.D on 04/15/2015 at 11:29 AM  Between 7am to 6pm - Pager - [phone]  After 6pm go to [email or web address] - password [email or web address] [facility]  [place] [facility]  Office  [phone]  CC: Primary care physician; Philis Fendt, MD

## 2015-04-15 NOTE — Progress Notes (Signed)
SATURATION QUALIFICATIONS: (This note is used to comply with regulatory documentation for home oxygen)  Patient Saturations on Room Air at Rest = 94%  Patient Saturations on Room Air while Ambulating = 92%  Patient Saturations on 0 Liters of oxygen while Ambulating =0%  Please briefly explain why patient needs home oxygen: does not qualify

## 2015-04-15 NOTE — Progress Notes (Signed)
Am glu 63 pt alert oriented juice given  Dr Leslye Peer rounded adjusted insulin , repeat glu 111  breakfast given  to pt

## 2015-04-15 NOTE — Progress Notes (Signed)
Pt given discharge instructions , and scripts, also medication stored in ;pharmacy returned to pt , pt discharged home care of family

## 2015-04-15 NOTE — Discharge Instructions (Signed)

## 2015-06-04 ENCOUNTER — Other Ambulatory Visit: Payer: Self-pay | Admitting: Specialist

## 2015-06-04 DIAGNOSIS — R918 Other nonspecific abnormal finding of lung field: Secondary | ICD-10-CM

## 2015-06-09 ENCOUNTER — Ambulatory Visit: Payer: Medicaid Other

## 2015-06-10 ENCOUNTER — Inpatient Hospital Stay: Payer: Medicaid Other | Admitting: Cardiothoracic Surgery

## 2015-06-11 ENCOUNTER — Telehealth: Payer: Self-pay | Admitting: *Deleted

## 2015-06-11 ENCOUNTER — Other Ambulatory Visit: Payer: Self-pay | Admitting: *Deleted

## 2015-06-11 DIAGNOSIS — R918 Other nonspecific abnormal finding of lung field: Secondary | ICD-10-CM

## 2015-06-11 NOTE — Telephone Encounter (Signed)
Per Dr. Genevive Bi' request, contacted patient's daughter, who assist with all appointment scheduling for patient. Notified discussion of patient in case conference yesterday as well as notifying of discontinuation of PET scan and CT chest with contrast scheduled for 06/15/15 3:15pm arrival at outpt imaging suite and Dr. Genevive Bi to see 06/17/15 at [place] understanding.

## 2015-06-14 ENCOUNTER — Ambulatory Visit: Payer: Medicaid Other

## 2015-06-15 ENCOUNTER — Ambulatory Visit
Admission: RE | Admit: 2015-06-15 | Discharge: 2015-06-15 | Disposition: A | Discharge status: Home / Self Care | Payer: Medicaid Other | Source: Ambulatory Visit | Attending: Cardiothoracic Surgery | Admitting: Cardiothoracic Surgery

## 2015-06-15 DIAGNOSIS — K449 Diaphragmatic hernia without obstruction or gangrene: Secondary | ICD-10-CM | POA: Insufficient documentation

## 2015-06-15 DIAGNOSIS — R918 Other nonspecific abnormal finding of lung field: Secondary | ICD-10-CM | POA: Insufficient documentation

## 2015-06-15 DIAGNOSIS — I700 Atherosclerosis of aorta: Secondary | ICD-10-CM | POA: Diagnosis not present

## 2015-06-15 DIAGNOSIS — I2510 Atherosclerotic heart disease of native coronary artery without angina pectoris: Secondary | ICD-10-CM | POA: Insufficient documentation

## 2015-06-15 LAB — POCT I-STAT CREATININE: CREATININE: 1.1 mg/dL — AB (ref 0.44–1.00)

## 2015-06-15 MED ORDER — IOPAMIDOL (ISOVUE-300) INJECTION 61%
75.0000 mL | Freq: Once | INTRAVENOUS | Status: AC | PRN
  Administered 2015-06-15: 75 mL via INTRAVENOUS

## 2015-06-17 ENCOUNTER — Other Ambulatory Visit: Payer: Self-pay | Admitting: Specialist

## 2015-06-17 ENCOUNTER — Inpatient Hospital Stay: Payer: Medicaid Other | Attending: Cardiothoracic Surgery | Admitting: Cardiothoracic Surgery

## 2015-06-17 DIAGNOSIS — R490 Dysphonia: Secondary | ICD-10-CM

## 2015-06-17 DIAGNOSIS — R918 Other nonspecific abnormal finding of lung field: Secondary | ICD-10-CM

## 2015-07-21 ENCOUNTER — Ambulatory Visit: Payer: Medicaid Other

## 2015-07-26 ENCOUNTER — Encounter: Payer: Self-pay | Admitting: Gastroenterology

## 2015-08-25 ENCOUNTER — Ambulatory Visit: Payer: Medicaid Other | Attending: Specialist

## 2015-09-30 ENCOUNTER — Ambulatory Visit: Payer: Medicaid Other

## 2015-10-04 ENCOUNTER — Ambulatory Visit (HOSPITAL_COMMUNITY): Admission: RE | Admit: 2015-10-04 | Payer: Medicaid Other | Source: Ambulatory Visit

## 2016-06-16 ENCOUNTER — Encounter (HOSPITAL_COMMUNITY): Payer: Self-pay | Admitting: Emergency Medicine

## 2016-06-16 ENCOUNTER — Emergency Department (HOSPITAL_COMMUNITY)
Admission: EM | Admit: 2016-06-16 | Discharge: 2016-06-16 | Disposition: A | Discharge status: Home / Self Care | Payer: Medicaid Other | Attending: Emergency Medicine | Admitting: Emergency Medicine

## 2016-06-16 DIAGNOSIS — E1165 Type 2 diabetes mellitus with hyperglycemia: Secondary | ICD-10-CM | POA: Insufficient documentation

## 2016-06-16 DIAGNOSIS — I10 Essential (primary) hypertension: Secondary | ICD-10-CM | POA: Diagnosis not present

## 2016-06-16 DIAGNOSIS — F17210 Nicotine dependence, cigarettes, uncomplicated: Secondary | ICD-10-CM | POA: Insufficient documentation

## 2016-06-16 DIAGNOSIS — J449 Chronic obstructive pulmonary disease, unspecified: Secondary | ICD-10-CM | POA: Diagnosis not present

## 2016-06-16 DIAGNOSIS — Z8673 Personal history of transient ischemic attack (TIA), and cerebral infarction without residual deficits: Secondary | ICD-10-CM | POA: Diagnosis not present

## 2016-06-16 DIAGNOSIS — I252 Old myocardial infarction: Secondary | ICD-10-CM | POA: Diagnosis not present

## 2016-06-16 DIAGNOSIS — R739 Hyperglycemia, unspecified: Secondary | ICD-10-CM

## 2016-06-16 LAB — CBC
HEMATOCRIT: 34.2 % — AB (ref 36.0–46.0)
HEMOGLOBIN: 11.2 g/dL — AB (ref 12.0–15.0)
MCH: 26.7 pg (ref 26.0–34.0)
MCHC: 32.7 g/dL (ref 30.0–36.0)
MCV: 81.6 fL (ref 78.0–100.0)
Platelets: 271 10*3/uL (ref 150–400)
RBC: 4.19 MIL/uL (ref 3.87–5.11)
RDW: 16.7 % — ABNORMAL HIGH (ref 11.5–15.5)
WBC: 6.9 10*3/uL (ref 4.0–10.5)

## 2016-06-16 LAB — BASIC METABOLIC PANEL
ANION GAP: 10 (ref 5–15)
BUN: 25 mg/dL — ABNORMAL HIGH (ref 6–20)
CO2: 23 mmol/L (ref 22–32)
Calcium: 9.2 mg/dL (ref 8.9–10.3)
Chloride: 96 mmol/L — ABNORMAL LOW (ref 101–111)
Creatinine, Ser: 1.56 mg/dL — ABNORMAL HIGH (ref 0.44–1.00)
GFR calc Af Amer: 41 mL/min — ABNORMAL LOW (ref >60)
GFR, EST NON AFRICAN AMERICAN: 35 mL/min — AB (ref >60)
GLUCOSE: 563 mg/dL — AB (ref 65–99)
POTASSIUM: 4.5 mmol/L (ref 3.5–5.1)
Sodium: 129 mmol/L — ABNORMAL LOW (ref 135–145)

## 2016-06-16 LAB — URINALYSIS, ROUTINE W REFLEX MICROSCOPIC
BILIRUBIN URINE: NEGATIVE
Glucose, UA: >=500 mg/dL — AB
KETONES UR: NEGATIVE mg/dL
LEUKOCYTES UA: NEGATIVE
NITRITE: NEGATIVE
PROTEIN: 30 mg/dL — AB
Specific Gravity, Urine: 1.023 (ref 1.005–1.030)
pH: 5 (ref 5.0–8.0)

## 2016-06-16 LAB — CBG MONITORING, ED
GLUCOSE-CAPILLARY: 542 mg/dL — AB (ref 65–99)
Glucose-Capillary: 461 mg/dL — ABNORMAL HIGH (ref 65–99)

## 2016-06-16 MED ORDER — INSULIN ASPART PROT & ASPART (70-30 MIX) 100 UNIT/ML ~~LOC~~ SUSP
8.0000 [IU] | Freq: Once | SUBCUTANEOUS | Status: AC
  Administered 2016-06-16: 8 [IU] via SUBCUTANEOUS
  Filled 2016-06-16: qty 10

## 2016-06-16 MED ORDER — TRAMADOL HCL 50 MG PO TABS
50.0000 mg | ORAL_TABLET | Freq: Once | ORAL | Status: AC
  Administered 2016-06-16: 50 mg via ORAL
  Filled 2016-06-16: qty 1

## 2016-06-16 NOTE — ED Triage Notes (Signed)
Pt verbalizes "sugar read high on the monitor today" verbalizes associated lightheadedness; denies other symptoms.

## 2016-06-16 NOTE — ED Notes (Signed)
Pt states she does not want to be here all night. Pt does not want an EKG pt states she does not need it.

## 2016-06-16 NOTE — ED Provider Notes (Signed)
Emergency Department Provider Note   I have reviewed the triage vital signs and the nursing notes.   HISTORY  Chief Complaint Hyperglycemia   HPI Christine Sims is a 61 y.o. female with PMH of COPD, DM, HTN, CAD, and CVA presents to the ED for evaluation of elevated blood sugar at home. Patient has been compliant with insulin at home. Her current dosing is Lantus 55U BID and Aspart 30U TID and she reports being compliant. Yesterday she reports normal blood sugars but they became elevated today. No fever, chills, vomiting, abdominal pain, or diarrhea. No modifying factors.    Past Medical History:  Diagnosis Date  . Arthritis   . Asthma   . COPD (chronic obstructive pulmonary disease) ([place])   . Diabetes mellitus without complication ([place])   . Hypercholesterolemia   . Hypertension   . MI (myocardial infarction)   . Shortness of breath dyspnea   . Stroke [facility])    R sided weakness    Patient Active Problem List   Diagnosis Date Noted  . Community acquired pneumonia 04/04/2015    Past Surgical History:  Procedure Laterality Date  . CARDIAC SURGERY    . FOOT SURGERY     bilateral    Current Outpatient Rx  . Order #: [id number] Class: Historical Med  . Order #: [id number] Class: Historical Med  . Order #: [id number] Class: Historical Med  . Order #: [id number] Class: Historical Med  . Order #: [id number] Class: Historical Med  . Order #: [id number] Class: Historical Med  . Order #: [id number] Class: Historical Med  . Order #: [id number] Class: Historical Med  . Order #: [id number] Class: Historical Med  . Order #: [id number] Class: Print  . Order #: [id number] Class: Historical Med  . Order #: [id number] Class: Historical Med  . Order #: [id number] Class: Print  . Order #: [id number] Class: Print  . Order #: [id number] Class: Print  . Order #: [id number] Class: Print  . Order #: [id number] Class: Historical Med  . Order #: [id number] Class: Print  . Order #: [id number] Class: Print  . Order #:  [id number] Class: Historical Med  . Order #: [id number] Class: Print  . Order #: [id number] Class: Print  . Order #: [id number] Class: Historical Med    Allergies Keppra [levetiracetam] and Lisinopril  Family History  Problem Relation Age of Onset  . Diabetes Other   . Hypertension Other     Social History Social History  Substance Use Topics  . Smoking status: Current Every Day Smoker    Types: Cigarettes  . Smokeless tobacco: Not on file  . Alcohol use Yes    Review of Systems  Constitutional: No fever/chills. Elevated blood sugars.  Eyes: No visual changes. ENT: No sore throat. Cardiovascular: Denies chest pain. Respiratory: Denies shortness of breath. Gastrointestinal: No abdominal pain.  No nausea, no vomiting.  No diarrhea.  No constipation. Genitourinary: Negative for dysuria. Musculoskeletal: Negative for back pain. Skin: Negative for rash. Neurological: Negative for headaches, focal weakness or numbness.  10-point ROS otherwise negative.  ____________________________________________   PHYSICAL EXAM:  VITAL SIGNS: ED Triage Vitals [06/16/16 1857]  Enc Vitals Group     BP (!) 178/98     Pulse Rate (!) 104     Resp 20     Temp 98.5 F (36.9 C)     Temp Source Oral     SpO2 100 %     Weight 279 lb (126.6 kg)     Height 5\' 4"  (1.626 m)   Constitutional: Alert and oriented. Well appearing and in no acute distress. Eyes: Conjunctivae  are normal.  Head: Atraumatic. Nose: No congestion/rhinnorhea. Mouth/Throat: Mucous membranes are moist.  Oropharynx non-erythematous. Neck: No stridor.  Cardiovascular: Normal rate, regular rhythm. Good peripheral circulation. Grossly normal heart sounds.   Respiratory: Normal respiratory effort.  No retractions. Lungs CTAB. Gastrointestinal: Soft and nontender. No distention.  Musculoskeletal: No lower extremity tenderness nor edema. No gross deformities of extremities. Neurologic:  Normal speech and language. No gross focal  neurologic deficits are appreciated.  Skin:  Skin is warm, dry and intact. No rash noted.   ____________________________________________   LABS (all labs ordered are listed, but only abnormal results are displayed)  Labs Reviewed  BASIC METABOLIC PANEL - Abnormal; Notable for the following:       Result Value   Sodium 129 (*)    Chloride 96 (*)    Glucose, Bld 563 (*)    BUN 25 (*)    Creatinine, Ser 1.56 (*)    GFR calc non Af Amer 35 (*)    GFR calc Af Amer 41 (*)    All other components within normal limits  CBC - Abnormal; Notable for the following:    Hemoglobin 11.2 (*)    HCT 34.2 (*)    RDW 16.7 (*)    All other components within normal limits  URINALYSIS, ROUTINE W REFLEX MICROSCOPIC - Abnormal; Notable for the following:    Color, Urine STRAW (*)    Glucose, UA >=500 (*)    Hgb urine dipstick SMALL (*)    Protein, ur 30 (*)    Bacteria, UA RARE (*)    Squamous Epithelial / LPF 0-5 (*)    All other components within normal limits  CBG MONITORING, ED - Abnormal; Notable for the following:    Glucose-Capillary 542 (*)    All other components within normal limits   ____________________________________________   PROCEDURES  Procedure(s) performed:   Procedures  None ____________________________________________   INITIAL IMPRESSION / ASSESSMENT AND PLAN / ED COURSE  Pertinent labs & imaging results that were available during my care of the patient were reviewed by me and considered in my medical decision making (see chart for details).  Patient with hyperglycemia despite compliance with insulin. No evidence of DKA/HHS. Glucose down-trending in the ED. Will call PCP on Monday for follow up appointment.   At this time, I do not feel there is any life-threatening condition present. I have reviewed and discussed all results (EKG, imaging, lab, urine as appropriate), exam findings with patient. I have reviewed nursing notes and appropriate previous records.  I  feel the patient is safe to be discharged home without further emergent workup. Discussed usual and customary return precautions. Patient and family (if present) verbalize understanding and are comfortable with this plan.  Patient will follow-up with their primary care provider. If they do not have a primary care provider, information for follow-up has been provided to them. All questions have been answered.    ____________________________________________  FINAL CLINICAL IMPRESSION(S) / ED DIAGNOSES  Final diagnoses:  Hyperglycemia     MEDICATIONS GIVEN DURING THIS VISIT:  Medications  insulin aspart protamine- aspart (NOVOLOG MIX 70/30) injection 8 Units (8 Units Subcutaneous Given 06/16/16 2035)  traMADol (ULTRAM) tablet 50 mg (50 mg Oral Given 06/16/16 2046)     Note:  This document was prepared using Dragon voice recognition software and may include unintentional dictation errors.  Nanda Quinton, MD Emergency Medicine  Margette Fast, MD 06/19/16 [phone]

## 2016-06-16 NOTE — Discharge Instructions (Signed)

## 2016-06-16 NOTE — ED Notes (Signed)
Date and time results received: 06/16/16  (use smartphrase ".now" to insert current time)  Test:Glucose  Critical Value:563  Name of Provider Notified  Long   Orders Received? Or Actions Taken?: none

## 2016-07-15 ENCOUNTER — Emergency Department (HOSPITAL_COMMUNITY): Payer: Medicaid Other

## 2016-07-15 ENCOUNTER — Other Ambulatory Visit: Payer: Self-pay

## 2016-07-15 ENCOUNTER — Encounter (HOSPITAL_COMMUNITY): Payer: Self-pay | Admitting: Emergency Medicine

## 2016-07-15 DIAGNOSIS — I509 Heart failure, unspecified: Secondary | ICD-10-CM | POA: Insufficient documentation

## 2016-07-15 DIAGNOSIS — Z79899 Other long term (current) drug therapy: Secondary | ICD-10-CM | POA: Diagnosis not present

## 2016-07-15 DIAGNOSIS — R600 Localized edema: Secondary | ICD-10-CM | POA: Diagnosis not present

## 2016-07-15 DIAGNOSIS — Z794 Long term (current) use of insulin: Secondary | ICD-10-CM | POA: Diagnosis not present

## 2016-07-15 DIAGNOSIS — E119 Type 2 diabetes mellitus without complications: Secondary | ICD-10-CM | POA: Diagnosis not present

## 2016-07-15 DIAGNOSIS — I252 Old myocardial infarction: Secondary | ICD-10-CM | POA: Insufficient documentation

## 2016-07-15 DIAGNOSIS — J441 Chronic obstructive pulmonary disease with (acute) exacerbation: Secondary | ICD-10-CM | POA: Diagnosis not present

## 2016-07-15 DIAGNOSIS — J449 Chronic obstructive pulmonary disease, unspecified: Secondary | ICD-10-CM | POA: Diagnosis not present

## 2016-07-15 DIAGNOSIS — I110 Hypertensive heart disease with heart failure: Secondary | ICD-10-CM | POA: Diagnosis not present

## 2016-07-15 DIAGNOSIS — R0602 Shortness of breath: Secondary | ICD-10-CM | POA: Diagnosis present

## 2016-07-15 DIAGNOSIS — Z87891 Personal history of nicotine dependence: Secondary | ICD-10-CM | POA: Diagnosis not present

## 2016-07-15 DIAGNOSIS — Z8673 Personal history of transient ischemic attack (TIA), and cerebral infarction without residual deficits: Secondary | ICD-10-CM | POA: Insufficient documentation

## 2016-07-15 LAB — BASIC METABOLIC PANEL
ANION GAP: 10 (ref 5–15)
BUN: 22 mg/dL — ABNORMAL HIGH (ref 6–20)
CALCIUM: 8.5 mg/dL — AB (ref 8.9–10.3)
CHLORIDE: 99 mmol/L — AB (ref 101–111)
CO2: 24 mmol/L (ref 22–32)
Creatinine, Ser: 1.43 mg/dL — ABNORMAL HIGH (ref 0.44–1.00)
GFR calc Af Amer: 45 mL/min — ABNORMAL LOW (ref >60)
GFR calc non Af Amer: 39 mL/min — ABNORMAL LOW (ref >60)
GLUCOSE: 484 mg/dL — AB (ref 65–99)
Potassium: 3.3 mmol/L — ABNORMAL LOW (ref 3.5–5.1)
Sodium: 133 mmol/L — ABNORMAL LOW (ref 135–145)

## 2016-07-15 LAB — BRAIN NATRIURETIC PEPTIDE: B Natriuretic Peptide: 13 pg/mL (ref 0.0–100.0)

## 2016-07-15 LAB — CBC
HEMATOCRIT: 33.3 % — AB (ref 36.0–46.0)
HEMOGLOBIN: 10.3 g/dL — AB (ref 12.0–15.0)
MCH: 25.7 pg — ABNORMAL LOW (ref 26.0–34.0)
MCHC: 30.9 g/dL (ref 30.0–36.0)
MCV: 83 fL (ref 78.0–100.0)
Platelets: 225 10*3/uL (ref 150–400)
RBC: 4.01 MIL/uL (ref 3.87–5.11)
RDW: 16.3 % — AB (ref 11.5–15.5)
WBC: 6.8 10*3/uL (ref 4.0–10.5)

## 2016-07-15 LAB — I-STAT TROPONIN, ED: Troponin i, poc: 0.01 ng/mL (ref 0.00–0.08)

## 2016-07-15 NOTE — ED Triage Notes (Signed)
Pt presents to ED for assessment of bilateral leg swelling and SOB x 1 day.  Patient unsure if she is still currently taking any diuretics.  Hx of CHF.

## 2016-07-16 ENCOUNTER — Emergency Department (HOSPITAL_COMMUNITY)
Admission: EM | Admit: 2016-07-16 | Discharge: 2016-07-16 | Disposition: A | Discharge status: Home / Self Care | Payer: Medicaid Other | Attending: Emergency Medicine | Admitting: Emergency Medicine

## 2016-07-16 DIAGNOSIS — R600 Localized edema: Secondary | ICD-10-CM

## 2016-07-16 DIAGNOSIS — J441 Chronic obstructive pulmonary disease with (acute) exacerbation: Secondary | ICD-10-CM

## 2016-07-16 HISTORY — DX: Heart failure, unspecified: I50.9

## 2016-07-16 MED ORDER — DOXYCYCLINE HYCLATE 100 MG PO TABS
100.0000 mg | ORAL_TABLET | Freq: Once | ORAL | Status: AC
  Administered 2016-07-16: 100 mg via ORAL
  Filled 2016-07-16: qty 1

## 2016-07-16 MED ORDER — ALBUTEROL SULFATE (2.5 MG/3ML) 0.083% IN NEBU
5.0000 mg | INHALATION_SOLUTION | Freq: Once | RESPIRATORY_TRACT | Status: AC
  Administered 2016-07-16: 5 mg via RESPIRATORY_TRACT
  Filled 2016-07-16: qty 6

## 2016-07-16 MED ORDER — HYDROCODONE-ACETAMINOPHEN 5-325 MG PO TABS
1.0000 | ORAL_TABLET | Freq: Once | ORAL | Status: AC
  Administered 2016-07-16: 1 via ORAL
  Filled 2016-07-16: qty 1

## 2016-07-16 MED ORDER — DOXYCYCLINE HYCLATE 100 MG PO CAPS: 100.0000 mg | ORAL_CAPSULE | Freq: Two times a day (BID) | ORAL | 0 refills | Status: DC

## 2016-07-16 MED ORDER — PREDNISONE 10 MG PO TABS: 60.0000 mg | ORAL_TABLET | Freq: Every day | ORAL | 0 refills | Status: DC

## 2016-07-16 MED ORDER — INSULIN ASPART 100 UNIT/ML ~~LOC~~ SOLN
10.0000 [IU] | Freq: Once | SUBCUTANEOUS | Status: AC
  Administered 2016-07-16: 10 [IU] via INTRAVENOUS
  Filled 2016-07-16: qty 1

## 2016-07-16 MED ORDER — PREDNISONE 20 MG PO TABS
60.0000 mg | ORAL_TABLET | Freq: Once | ORAL | Status: DC
  Filled 2016-07-16: qty 3

## 2016-07-16 MED ORDER — SODIUM CHLORIDE 0.9 % IV BOLUS (SEPSIS)
1000.0000 mL | Freq: Once | INTRAVENOUS | Status: AC
  Administered 2016-07-16: 1000 mL via INTRAVENOUS

## 2016-07-16 MED ORDER — IPRATROPIUM BROMIDE 0.02 % IN SOLN
0.5000 mg | Freq: Once | RESPIRATORY_TRACT | Status: AC
  Administered 2016-07-16: 0.5 mg via RESPIRATORY_TRACT
  Filled 2016-07-16: qty 2.5

## 2016-07-16 NOTE — ED Notes (Signed)
Pt refuses to get her prednisone states " steroids makes you fat, I don't want it, I didn't came for my breathing I came for pain relief on my legs"

## 2016-07-16 NOTE — ED Provider Notes (Signed)
[facility] DEPT Provider Note   CSN: [id number] Arrival date & time: 07/15/16  2251  By signing my name below, I, Christine Sims, attest that this documentation has been prepared under the direction and in the presence of physician practitioner, Jola Schmidt, MD. Electronically Signed: [name], Scribe. 07/16/2016. 3:31 AM.  History   Chief Complaint Chief Complaint  Patient presents with  . Shortness of Breath  . Leg Swelling   The history is provided by the patient. No language interpreter was used.    HPI Comments: Christine Sims is a 61 y.o. female with PMHx including COPD, CHF, DM, HTN, MI, and asthma who presents to the Emergency Department complaining of persistent dyspnea for three days. She reports associated chest tightness, wheezing, and bilateral lower leg swelling. Patient has been using her albuterol inhaler without significant improvement of her respiratory symptoms. Her blood glucose was measured at 484 in the ED tonight and she reports that she has been drinking "a lot of sodas" recently. She uses insulin for diabetes and has been compliant. Patient does not use oxygen therapy at home. Patient denies fevers, cough, or any other associated symptoms. She receives regular visitation from [employer] .  Past Medical History:  Diagnosis Date  . Arthritis   . Asthma   . CHF (congestive heart failure) ([place])   . COPD (chronic obstructive pulmonary disease) ([place])   . Diabetes mellitus without complication ([place])   . Hypercholesterolemia   . Hypertension   . MI (myocardial infarction) ([place])   . Shortness of breath dyspnea   . Stroke [facility]    R sided weakness    Patient Active Problem List   Diagnosis Date Noted  . Community acquired pneumonia 04/04/2015    Past Surgical History:  Procedure Laterality Date  . CARDIAC SURGERY    . FOOT SURGERY     bilateral    OB History    No data available       Home Medications    Prior to Admission  medications   Medication Sig Start Date End Date Taking? Authorizing Provider  albuterol (PROVENTIL HFA;VENTOLIN HFA) 108 (90 Base) MCG/ACT inhaler Inhale 2 puffs into the lungs every 4 (four) hours as needed for wheezing or shortness of breath. 04/15/15   Loletha Grayer, MD  albuterol (PROVENTIL) (2.5 MG/3ML) 0.083% nebulizer solution Take 3 mLs (2.5 mg total) by nebulization every 4 (four) hours as needed. For wheezing. 04/15/15 04/14/16  Loletha Grayer, MD  amLODipine (NORVASC) 10 MG tablet Take 10 mg by mouth daily.    [provider]  atorvastatin (LIPITOR) 80 MG tablet Take 80 mg by mouth at bedtime.     [provider]  budesonide-formoterol (SYMBICORT) 160-4.5 MCG/ACT inhaler Inhale 2 puffs into the lungs 2 (two) times daily. 04/15/15   Loletha Grayer, MD  busPIRone (BUSPAR) 30 MG tablet Take 30 mg by mouth 2 (two) times daily.    [provider]  cloNIDine (CATAPRES) 0.1 MG tablet Take 1 tablet (0.1 mg total) by mouth 2 (two) times daily. Patient not taking: Reported on 06/16/2016 04/15/15   Loletha Grayer, MD  clopidogrel (PLAVIX) 75 MG tablet Take 75 mg by mouth daily.    [provider]  dextrose (GLUTOSE) 40 % gel Take 15 g by mouth once as needed (for low blood sugar).    [provider]  doxycycline (VIBRA-TABS) 100 MG tablet Take 1 tablet (100 mg total) by mouth every 12 (twelve) hours. Patient not taking: Reported on 06/16/2016 04/15/15  Loletha Grayer, MD  famotidine (PEPCID) 20 MG tablet Take 20 mg by mouth 2 (two) times daily.    [provider]  gabapentin (NEURONTIN) 300 MG capsule Take 900 mg by mouth at bedtime.     [provider]  insulin aspart (NOVOLOG) 100 UNIT/ML FlexPen Inject 30 Units into the skin 3 (three) times daily with meals.  05/31/16   [provider]  insulin glargine (LANTUS) 100 UNIT/ML injection Inject 55 Units into the skin 2 (two) times daily.     [provider]    losartan-hydrochlorothiazide (HYZAAR) 100-25 MG per tablet Take 1 tablet by mouth every morning.    [provider]  metoprolol succinate (TOPROL XL) 25 MG 24 hr tablet Take 1 tablet (25 mg total) by mouth daily. 04/15/15   Loletha Grayer, MD  nystatin (MYCOSTATIN) 100000 UNIT/ML suspension Take 5 mLs (500,000 Units total) by mouth 4 (four) times daily. Patient not taking: Reported on 06/16/2016 04/15/15   Loletha Grayer, MD  omeprazole (PRILOSEC) 40 MG capsule Take 40 mg by mouth daily.    [provider]  oxyCODONE (OXY IR/ROXICODONE) 5 MG immediate release tablet Take 1 tablet (5 mg total) by mouth every 4 (four) hours as needed for moderate pain. Patient not taking: Reported on 06/16/2016 04/15/15   Loletha Grayer, MD  predniSONE (DELTASONE) 10 MG tablet Take 4 tabs day 1; take 3 tabs day 2, 3; Take 2 tabs day4,5; Take 1 tab day 6, 7; Take 1/2 tab day8,9 then stop Patient not taking: Reported on 06/16/2016 04/15/15   Loletha Grayer, MD  sertraline (ZOLOFT) 100 MG tablet Take 100 mg by mouth daily.    [provider]  tiotropium (SPIRIVA) 18 MCG inhalation capsule Place 18 mcg into inhaler and inhale daily.  08/18/15 08/17/16  [provider]  traZODone (DESYREL) 100 MG tablet Take 200 mg by mouth at bedtime.     [provider]    Family History Family History  Problem Relation Age of Onset  . Diabetes Other   . Hypertension Other     Social History Social History  Substance Use Topics  . Smoking status: Former Smoker    Types: Cigarettes  . Smokeless tobacco: Never Used  . Alcohol use Yes     Allergies   Keppra [levetiracetam] and Lisinopril   Review of Systems Review of Systems All other systems reviewed and are negative for acute change except as noted in the HPI. Physical Exam Updated Vital Signs BP 137/73   Pulse 85   Temp 99.4 F (37.4 C) (Oral)   Resp (!) 22   SpO2 97%   Physical Exam  Constitutional: She is oriented  to person, place, and time. She appears well-developed and well-nourished. No distress.  HENT:  Head: Normocephalic and atraumatic.  Eyes: EOM are normal.  Neck: Normal range of motion.  Cardiovascular: Normal rate, regular rhythm and normal heart sounds.   Pulmonary/Chest: Effort normal. She has wheezes.  Wheezing bilaterally.  Abdominal: Soft. She exhibits no distension. There is no tenderness.  Musculoskeletal: Normal range of motion. She exhibits edema.  1+ pitting edema bilaterally.  Neurological: She is alert and oriented to person, place, and time.  Skin: Skin is warm and dry.  Psychiatric: She has a normal mood and affect. Judgment normal.  Nursing note and vitals reviewed.  ED Treatments / Results  Labs (all labs ordered are listed, but only abnormal results are displayed) Labs Reviewed  CBC - Abnormal; Notable for the  following:       Result Value   Hemoglobin 10.3 (*)    HCT 33.3 (*)    MCH 25.7 (*)    RDW 16.3 (*)    All other components within normal limits  BASIC METABOLIC PANEL - Abnormal; Notable for the following:    Sodium 133 (*)    Potassium 3.3 (*)    Chloride 99 (*)    Glucose, Bld 484 (*)    BUN 22 (*)    Creatinine, Ser 1.43 (*)    Calcium 8.5 (*)    GFR calc non Af Amer 39 (*)    GFR calc Af Amer 45 (*)    All other components within normal limits  BRAIN NATRIURETIC PEPTIDE  I-STAT TROPOININ, ED    EKG  EKG Interpretation  Date/Time:  Saturday Jul 15 2016 22:56:05 EDT Ventricular Rate:  97 PR Interval:  178 QRS Duration: 90 QT Interval:  378 QTC Calculation: 480 R Axis:   15 Text Interpretation:  Normal sinus rhythm Possible Inferior infarct , age undetermined Cannot rule out Anterior infarct , age undetermined Abnormal ECG No significant change was found Confirmed by Venora Maples  MD, Lennette Bihari ([id number]) on 07/16/2016 6:00:57 AM       Radiology Dg Chest 2 View  Result Date: 07/16/2016 CLINICAL DATA:  Shortness of breath for 1 day. History of  CHF. Bilateral leg swelling. History of diabetes, hypertension, COPD EXAM: CHEST  2 VIEW COMPARISON:  05/16/2014 FINDINGS: Cardiac enlargement. Pulmonary vascularity is normal. No airspace disease or consolidation in the lungs. Hazy opacities over the lung bases likely represent soft tissue attenuation. No blunting of costophrenic angles. No pneumothorax. Calcified and tortuous aorta. IMPRESSION: Cardiac enlargement.  No evidence of active pulmonary disease. Electronically Signed   By: Lucienne Capers M.D.   On: 07/16/2016 00:05    Procedures Procedures (including critical care time)  DIAGNOSTIC STUDIES: Oxygen Saturation is 96% on RA, adequate by my interpretation.    COORDINATION OF CARE: 3:30 AM Discussed treatment plan with pt at bedside and pt agreed to plan.  Medications Ordered in ED Medications  predniSONE (DELTASONE) tablet 60 mg (60 mg Oral Not Given 07/16/16 0432)  doxycycline (VIBRA-TABS) tablet 100 mg (not administered)  sodium chloride 0.9 % bolus 1,000 mL (1,000 mLs Intravenous New Bag/Given 07/16/16 0427)  insulin aspart (novoLOG) injection 10 Units (10 Units Intravenous Given 07/16/16 0427)  albuterol (PROVENTIL) (2.5 MG/3ML) 0.083% nebulizer solution 5 mg (5 mg Nebulization Given 07/16/16 0428)  ipratropium (ATROVENT) nebulizer solution 0.5 mg (0.5 mg Nebulization Given 07/16/16 0428)  HYDROcodone-acetaminophen (NORCO/VICODIN) 5-325 MG per tablet 1 tablet (1 tablet Oral Given 07/16/16 0454)     Initial Impression / Assessment and Plan / ED Course  I have reviewed the triage vital signs and the nursing notes.  Pertinent labs & imaging results that were available during my care of the patient were reviewed by me and considered in my medical decision making (see chart for details).      Patient feels much better after bronchodilators.  This time she is not wanting steroids as she is concerned this will make her overweight.  I've told her that this will not in a short course  but that I highly recommend prednisone for what appears to be a COPD exacerbation.  I will let her make that decision herself.  Patient be covered with doxycycline.  No obvious infiltrate on chest x-ray.  Temp 99.4.  Close primary care follow-up.  Patient understands to return the  ER for new or worsening symptoms in regards to swelling of the lower legs this is likely venous insufficiency .  I recommended elevation dietary changes, salt restriction and compression stockings   I personally performed the services described in this documentation, which was scribed in my presence. The recorded information has been reviewed and is accurate.     Final Clinical Impressions(s) / ED Diagnoses   Final diagnoses:  COPD exacerbation (HCC)  Leg edema    New Prescriptions New Prescriptions   DOXYCYCLINE (VIBRAMYCIN) 100 MG CAPSULE    Take 1 capsule (100 mg total) by mouth 2 (two) times daily.   PREDNISONE (DELTASONE) 10 MG TABLET    Take 6 tablets (60 mg total) by mouth daily.       Jola Schmidt, MD 07/16/16 [phone]

## 2016-08-22 ENCOUNTER — Emergency Department (HOSPITAL_COMMUNITY)
Admission: EM | Admit: 2016-08-22 | Discharge: 2016-08-22 | Disposition: A | Discharge status: Home / Self Care | Payer: Medicaid Other | Attending: Emergency Medicine | Admitting: Emergency Medicine

## 2016-08-22 ENCOUNTER — Encounter (HOSPITAL_COMMUNITY): Payer: Self-pay | Admitting: Obstetrics and Gynecology

## 2016-08-22 DIAGNOSIS — B373 Candidiasis of vulva and vagina: Secondary | ICD-10-CM | POA: Diagnosis not present

## 2016-08-22 DIAGNOSIS — Z79899 Other long term (current) drug therapy: Secondary | ICD-10-CM | POA: Insufficient documentation

## 2016-08-22 DIAGNOSIS — Z794 Long term (current) use of insulin: Secondary | ICD-10-CM | POA: Diagnosis not present

## 2016-08-22 DIAGNOSIS — Z87891 Personal history of nicotine dependence: Secondary | ICD-10-CM | POA: Insufficient documentation

## 2016-08-22 DIAGNOSIS — I252 Old myocardial infarction: Secondary | ICD-10-CM | POA: Insufficient documentation

## 2016-08-22 DIAGNOSIS — N3000 Acute cystitis without hematuria: Secondary | ICD-10-CM | POA: Diagnosis not present

## 2016-08-22 DIAGNOSIS — R739 Hyperglycemia, unspecified: Secondary | ICD-10-CM | POA: Insufficient documentation

## 2016-08-22 DIAGNOSIS — I10 Essential (primary) hypertension: Secondary | ICD-10-CM | POA: Insufficient documentation

## 2016-08-22 DIAGNOSIS — J45909 Unspecified asthma, uncomplicated: Secondary | ICD-10-CM | POA: Insufficient documentation

## 2016-08-22 DIAGNOSIS — Z7902 Long term (current) use of antithrombotics/antiplatelets: Secondary | ICD-10-CM | POA: Diagnosis not present

## 2016-08-22 DIAGNOSIS — N390 Urinary tract infection, site not specified: Secondary | ICD-10-CM

## 2016-08-22 DIAGNOSIS — J449 Chronic obstructive pulmonary disease, unspecified: Secondary | ICD-10-CM | POA: Insufficient documentation

## 2016-08-22 DIAGNOSIS — R102 Pelvic and perineal pain: Secondary | ICD-10-CM | POA: Diagnosis present

## 2016-08-22 DIAGNOSIS — B3731 Acute candidiasis of vulva and vagina: Secondary | ICD-10-CM

## 2016-08-22 LAB — BASIC METABOLIC PANEL
Anion gap: 9 (ref 5–15)
BUN: 32 mg/dL — ABNORMAL HIGH (ref 6–20)
CHLORIDE: 98 mmol/L — AB (ref 101–111)
CO2: 25 mmol/L (ref 22–32)
CREATININE: 1.74 mg/dL — AB (ref 0.44–1.00)
Calcium: 9.1 mg/dL (ref 8.9–10.3)
GFR, EST AFRICAN AMERICAN: 36 mL/min — AB (ref >60)
GFR, EST NON AFRICAN AMERICAN: 31 mL/min — AB (ref >60)
Glucose, Bld: 536 mg/dL (ref 65–99)
POTASSIUM: 3.6 mmol/L (ref 3.5–5.1)
SODIUM: 132 mmol/L — AB (ref 135–145)

## 2016-08-22 LAB — CBG MONITORING, ED
GLUCOSE-CAPILLARY: 546 mg/dL — AB (ref 65–99)
GLUCOSE-CAPILLARY: 396 mg/dL — AB (ref 65–99)

## 2016-08-22 LAB — URINALYSIS, ROUTINE W REFLEX MICROSCOPIC
Bilirubin Urine: NEGATIVE
Ketones, ur: NEGATIVE mg/dL
Nitrite: NEGATIVE
PH: 5 (ref 5.0–8.0)
Protein, ur: 100 mg/dL — AB
SPECIFIC GRAVITY, URINE: 1.02 (ref 1.005–1.030)

## 2016-08-22 LAB — WET PREP, GENITAL
Clue Cells Wet Prep HPF POC: NONE SEEN
SPERM: NONE SEEN
Trich, Wet Prep: NONE SEEN
Yeast Wet Prep HPF POC: NONE SEEN

## 2016-08-22 LAB — CBC WITH DIFFERENTIAL/PLATELET
BASOS PCT: 0 %
Basophils Absolute: 0 10*3/uL (ref 0.0–0.1)
EOS ABS: 0 10*3/uL (ref 0.0–0.7)
Eosinophils Relative: 0 %
HCT: 34.5 % — ABNORMAL LOW (ref 36.0–46.0)
HEMOGLOBIN: 10.9 g/dL — AB (ref 12.0–15.0)
Lymphocytes Relative: 19 %
Lymphs Abs: 1.8 10*3/uL (ref 0.7–4.0)
MCH: 25.8 pg — ABNORMAL LOW (ref 26.0–34.0)
MCHC: 31.6 g/dL (ref 30.0–36.0)
MCV: 81.8 fL (ref 78.0–100.0)
Monocytes Absolute: 0.7 10*3/uL (ref 0.1–1.0)
Monocytes Relative: 7 %
NEUTROS PCT: 74 %
Neutro Abs: 7.2 10*3/uL (ref 1.7–7.7)
PLATELETS: 250 10*3/uL (ref 150–400)
RBC: 4.22 MIL/uL (ref 3.87–5.11)
RDW: 15.7 % — ABNORMAL HIGH (ref 11.5–15.5)
WBC: 9.7 10*3/uL (ref 4.0–10.5)

## 2016-08-22 MED ORDER — FLUCONAZOLE 150 MG PO TABS
150.0000 mg | ORAL_TABLET | Freq: Once | ORAL | Status: AC
  Administered 2016-08-22: 150 mg via ORAL
  Filled 2016-08-22: qty 1

## 2016-08-22 MED ORDER — INSULIN ASPART 100 UNIT/ML ~~LOC~~ SOLN
12.0000 [IU] | Freq: Once | SUBCUTANEOUS | Status: AC
  Administered 2016-08-22: 12 [IU] via SUBCUTANEOUS
  Filled 2016-08-22: qty 1

## 2016-08-22 MED ORDER — FLUCONAZOLE 200 MG PO TABS: 200.0000 mg | ORAL_TABLET | Freq: Every day | ORAL | 0 refills | Status: AC

## 2016-08-22 MED ORDER — INSULIN ASPART 100 UNIT/ML ~~LOC~~ SOLN: 10.0000 [IU] | Freq: Once | SUBCUTANEOUS | Status: DC

## 2016-08-22 MED ORDER — MICONAZOLE NITRATE 4 % VA CREA: TOPICAL_CREAM | VAGINAL | 0 refills | Status: DC

## 2016-08-22 MED ORDER — CEPHALEXIN 500 MG PO CAPS: 500.0000 mg | ORAL_CAPSULE | Freq: Three times a day (TID) | ORAL | 0 refills | Status: DC
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
## 2016-08-22 NOTE — ED Notes (Signed)
Bed: WLPT1 Expected date:  Expected time:  Means of arrival:  Comments: 

## 2016-08-22 NOTE — ED Triage Notes (Signed)
Pt states she has been having vaginal bleeding for a few months and believes it is because of a prolonged yeast infection. Pt states it itches frequently in her genitals and that whenever she wipes there is blood.

## 2016-08-22 NOTE — Discharge Instructions (Signed)
Take keflex as prescribed until all gone for urinary tract infection. Apply monistat cream topically. Diflucan for yeast infection. Watch your diet closely. Follow up with primary care doctor in 1 week.

## 2016-08-22 NOTE — ED Notes (Signed)
Bed: WA20 Expected date:  Expected time:  Means of arrival:  Comments: EMS 

## 2016-08-23 LAB — GC/CHLAMYDIA PROBE AMP (~~LOC~~) NOT AT ARMC
Chlamydia: NEGATIVE
Neisseria Gonorrhea: NEGATIVE

## 2016-08-24 LAB — URINE CULTURE

## 2016-08-27 ENCOUNTER — Emergency Department (HOSPITAL_COMMUNITY): Payer: Medicaid Other

## 2016-08-27 ENCOUNTER — Emergency Department (HOSPITAL_COMMUNITY)
Admission: EM | Admit: 2016-08-27 | Discharge: 2016-08-27 | Disposition: A | Discharge status: Home / Self Care | Payer: Medicaid Other | Attending: Emergency Medicine | Admitting: Emergency Medicine

## 2016-08-27 ENCOUNTER — Encounter (HOSPITAL_COMMUNITY): Payer: Self-pay | Admitting: *Deleted

## 2016-08-27 DIAGNOSIS — J441 Chronic obstructive pulmonary disease with (acute) exacerbation: Secondary | ICD-10-CM | POA: Diagnosis not present

## 2016-08-27 DIAGNOSIS — Z87891 Personal history of nicotine dependence: Secondary | ICD-10-CM | POA: Insufficient documentation

## 2016-08-27 DIAGNOSIS — Z794 Long term (current) use of insulin: Secondary | ICD-10-CM | POA: Diagnosis not present

## 2016-08-27 DIAGNOSIS — E1165 Type 2 diabetes mellitus with hyperglycemia: Secondary | ICD-10-CM | POA: Insufficient documentation

## 2016-08-27 DIAGNOSIS — I509 Heart failure, unspecified: Secondary | ICD-10-CM | POA: Diagnosis not present

## 2016-08-27 DIAGNOSIS — I110 Hypertensive heart disease with heart failure: Secondary | ICD-10-CM | POA: Insufficient documentation

## 2016-08-27 DIAGNOSIS — R0602 Shortness of breath: Secondary | ICD-10-CM | POA: Diagnosis present

## 2016-08-27 DIAGNOSIS — R52 Pain, unspecified: Secondary | ICD-10-CM

## 2016-08-27 DIAGNOSIS — R739 Hyperglycemia, unspecified: Secondary | ICD-10-CM

## 2016-08-27 LAB — CBC WITH DIFFERENTIAL/PLATELET
Basophils Absolute: 0 10*3/uL (ref 0.0–0.1)
Basophils Relative: 1 %
EOS ABS: 0.1 10*3/uL (ref 0.0–0.7)
EOS PCT: 1 %
HCT: 34.6 % — ABNORMAL LOW (ref 36.0–46.0)
Hemoglobin: 11 g/dL — ABNORMAL LOW (ref 12.0–15.0)
LYMPHS ABS: 2.7 10*3/uL (ref 0.7–4.0)
LYMPHS PCT: 45 %
MCH: 25.6 pg — AB (ref 26.0–34.0)
MCHC: 31.8 g/dL (ref 30.0–36.0)
MCV: 80.5 fL (ref 78.0–100.0)
MONO ABS: 0.5 10*3/uL (ref 0.1–1.0)
Monocytes Relative: 8 %
Neutro Abs: 2.8 10*3/uL (ref 1.7–7.7)
Neutrophils Relative %: 45 %
PLATELETS: 284 10*3/uL (ref 150–400)
RBC: 4.3 MIL/uL (ref 3.87–5.11)
RDW: 15 % (ref 11.5–15.5)
WBC: 6.1 10*3/uL (ref 4.0–10.5)

## 2016-08-27 LAB — COMPREHENSIVE METABOLIC PANEL
ALT: 14 U/L (ref 14–54)
ANION GAP: 10 (ref 5–15)
AST: 14 U/L — ABNORMAL LOW (ref 15–41)
Albumin: 3.5 g/dL (ref 3.5–5.0)
Alkaline Phosphatase: 89 U/L (ref 38–126)
BUN: 23 mg/dL — ABNORMAL HIGH (ref 6–20)
CHLORIDE: 99 mmol/L — AB (ref 101–111)
CO2: 25 mmol/L (ref 22–32)
CREATININE: 1.44 mg/dL — AB (ref 0.44–1.00)
Calcium: 9.2 mg/dL (ref 8.9–10.3)
GFR, EST AFRICAN AMERICAN: 45 mL/min — AB (ref >60)
GFR, EST NON AFRICAN AMERICAN: 39 mL/min — AB (ref >60)
Glucose, Bld: 413 mg/dL — ABNORMAL HIGH (ref 65–99)
POTASSIUM: 3.5 mmol/L (ref 3.5–5.1)
Sodium: 134 mmol/L — ABNORMAL LOW (ref 135–145)
Total Bilirubin: 0.1 mg/dL — ABNORMAL LOW (ref 0.3–1.2)
Total Protein: 7.6 g/dL (ref 6.5–8.1)

## 2016-08-27 LAB — I-STAT CHEM 8, ED
BUN: 24 mg/dL — ABNORMAL HIGH (ref 6–20)
CREATININE: 1.2 mg/dL — AB (ref 0.44–1.00)
Calcium, Ion: 1.14 mmol/L — ABNORMAL LOW (ref 1.15–1.40)
Chloride: 102 mmol/L (ref 101–111)
Glucose, Bld: 412 mg/dL — ABNORMAL HIGH (ref 65–99)
HEMATOCRIT: 35 % — AB (ref 36.0–46.0)
HEMOGLOBIN: 11.9 g/dL — AB (ref 12.0–15.0)
Potassium: 3.5 mmol/L (ref 3.5–5.1)
SODIUM: 137 mmol/L (ref 135–145)
TCO2: 28 mmol/L (ref 0–100)

## 2016-08-27 LAB — URINALYSIS, ROUTINE W REFLEX MICROSCOPIC
BILIRUBIN URINE: NEGATIVE
Glucose, UA: >=500 mg/dL — AB
Hgb urine dipstick: NEGATIVE
Ketones, ur: NEGATIVE mg/dL
Leukocytes, UA: NEGATIVE
NITRITE: NEGATIVE
PH: 5 (ref 5.0–8.0)
Protein, ur: 100 mg/dL — AB
SPECIFIC GRAVITY, URINE: 1.022 (ref 1.005–1.030)

## 2016-08-27 LAB — I-STAT TROPONIN, ED
TROPONIN I, POC: 0 ng/mL (ref 0.00–0.08)
Troponin i, poc: 0 ng/mL (ref 0.00–0.08)

## 2016-08-27 LAB — CBG MONITORING, ED
GLUCOSE-CAPILLARY: 368 mg/dL — AB (ref 65–99)
Glucose-Capillary: 402 mg/dL — ABNORMAL HIGH (ref 65–99)

## 2016-08-27 LAB — I-STAT CG4 LACTIC ACID, ED
LACTIC ACID, VENOUS: 2.64 mmol/L — AB (ref 0.5–1.9)
Lactic Acid, Venous: 1.18 mmol/L (ref 0.5–1.9)

## 2016-08-27 LAB — BRAIN NATRIURETIC PEPTIDE: B Natriuretic Peptide: 14.8 pg/mL (ref 0.0–100.0)

## 2016-08-27 MED ORDER — INSULIN ASPART 100 UNIT/ML ~~LOC~~ SOLN
10.0000 [IU] | Freq: Once | SUBCUTANEOUS | Status: AC
  Administered 2016-08-27: 10 [IU] via SUBCUTANEOUS
  Filled 2016-08-27: qty 1

## 2016-08-27 MED ORDER — IPRATROPIUM BROMIDE 0.02 % IN SOLN
0.5000 mg | Freq: Once | RESPIRATORY_TRACT | Status: AC
  Administered 2016-08-27: 0.5 mg via RESPIRATORY_TRACT
  Filled 2016-08-27: qty 2.5

## 2016-08-27 MED ORDER — SODIUM CHLORIDE 0.9 % IV BOLUS (SEPSIS)
1000.0000 mL | Freq: Once | INTRAVENOUS | Status: AC
  Administered 2016-08-27: 1000 mL via INTRAVENOUS

## 2016-08-27 MED ORDER — METHYLPREDNISOLONE SODIUM SUCC 125 MG IJ SOLR
125.0000 mg | Freq: Once | INTRAMUSCULAR | Status: AC
  Administered 2016-08-27: 125 mg via INTRAVENOUS
  Filled 2016-08-27: qty 2

## 2016-08-27 MED ORDER — KETOROLAC TROMETHAMINE 30 MG/ML IJ SOLN
30.0000 mg | Freq: Once | INTRAMUSCULAR | Status: AC
  Administered 2016-08-27: 30 mg via INTRAVENOUS
  Filled 2016-08-27: qty 1

## 2016-08-27 MED ORDER — DOXYCYCLINE HYCLATE 100 MG IV SOLR
100.0000 mg | Freq: Once | INTRAVENOUS | Status: AC
  Administered 2016-08-27: 100 mg via INTRAVENOUS
  Filled 2016-08-27: qty 100

## 2016-08-27 MED ORDER — ALBUTEROL SULFATE (2.5 MG/3ML) 0.083% IN NEBU
5.0000 mg | INHALATION_SOLUTION | Freq: Once | RESPIRATORY_TRACT | Status: AC
  Administered 2016-08-27: 5 mg via RESPIRATORY_TRACT
  Filled 2016-08-27: qty 6

## 2016-08-27 MED ORDER — DOXYCYCLINE HYCLATE 100 MG PO CAPS
100.0000 mg | ORAL_CAPSULE | Freq: Two times a day (BID) | ORAL | 0 refills | Status: DC
Start: 2016-08-27 — End: 2017-02-18

## 2016-08-27 NOTE — Discharge Instructions (Signed)
Take doxycyline as prescribed. It will help your COPD.   Use albuterol every 4-6 hrs as needed for wheezing.   See your doctor  Return to ER if you have worse wheezing, trouble breathing, fever, vomiting, chest pain, shortness of breath.

## 2016-08-27 NOTE — ED Notes (Signed)
Patient transported to X-ray 

## 2016-08-27 NOTE — ED Notes (Signed)
Bed: WL89 Expected date: 08/27/16 Expected time: 9:27 AM Means of arrival: Ambulance Comments: Shortness of Breath, productive cough

## 2016-08-27 NOTE — ED Provider Notes (Signed)
[place] DEPT Provider Note   CSN: [id number] Arrival date & time: 08/27/16  2376     History   Chief Complaint Chief Complaint  Patient presents with  . Shortness of Breath  . Cough    productive    HPI Christine Sims is a 61 y.o. female history heart failure, diabetes, MI, COPD on 2 L nasal cannula at baseline, here presenting with productive cough, worsening shortness of breath. Patient states that she's been having productive cough over the last several days. She states that she's been coughing up some yellowish sputum and has some subjective chills. Patient also states that her blood sugar has been running high around 300-400. States any vomiting or abdominal pain. Patient was seen here several days ago for vaginal bleeding but denies any bleeding currently. Patient on 2 L [place] at baseline. She also fell earlier today and landed on L wrist and L knee. Patient brought in by EMS.   The history is provided by the patient.    Past Medical History:  Diagnosis Date  . Arthritis   . Asthma   . CHF (congestive heart failure) ([place])   . COPD (chronic obstructive pulmonary disease) ([place])   . Diabetes mellitus without complication ([place])   . Hypercholesterolemia   . Hypertension   . MI (myocardial infarction) ([place])   . Shortness of breath dyspnea   . Stroke [facility])    R sided weakness    Patient Active Problem List   Diagnosis Date Noted  . Community acquired pneumonia 04/04/2015    Past Surgical History:  Procedure Laterality Date  . CARDIAC SURGERY    . FOOT SURGERY     bilateral    OB History    Gravida Para Term Preterm AB Living             2   SAB TAB Ectopic Multiple Live Births                   Home Medications    Prior to Admission medications   Medication Sig Start Date End Date Taking? Authorizing Provider  albuterol (PROVENTIL HFA;VENTOLIN HFA) 108 (90 Base) MCG/ACT inhaler Inhale 2 puffs into the lungs every 4 (four) hours as needed for  wheezing or shortness of breath. 04/15/15  Yes Wieting, Richard, MD  albuterol (PROVENTIL) (2.5 MG/3ML) 0.083% nebulizer solution Take 3 mLs (2.5 mg total) by nebulization every 4 (four) hours as needed. For wheezing. 04/15/15 08/27/16 Yes Wieting, Richard, MD  amLODipine (NORVASC) 10 MG tablet Take 10 mg by mouth daily.   Yes [provider]  atorvastatin (LIPITOR) 80 MG tablet Take 80 mg by mouth daily.    Yes [provider]  budesonide-formoterol (SYMBICORT) 160-4.5 MCG/ACT inhaler Inhale 2 puffs into the lungs 2 (two) times daily. 04/15/15  Yes Wieting, Richard, MD  busPIRone (BUSPAR) 30 MG tablet TAKE ONE TABLET BY MOUTH TWICE DAILY 07/07/16  Yes [provider]  cephALEXin (KEFLEX) 500 MG capsule Take 1 capsule (500 mg total) by mouth 3 (three) times daily. 08/22/16  Yes Kirichenko, Tatyana, PA-C  clopidogrel (PLAVIX) 75 MG tablet Take 75 mg by mouth daily.   Yes [provider]  famotidine (PEPCID) 20 MG tablet Take 20 mg by mouth 2 (two) times daily.   Yes [provider]  fluconazole (DIFLUCAN) 200 MG tablet Take 1 tablet (200 mg total) by mouth daily. 08/22/16 08/29/16 Yes Kirichenko, Tatyana, PA-C  gabapentin (NEURONTIN) 300 MG capsule Take 900 mg by mouth  at bedtime.    Yes [provider]  insulin aspart (NOVOLOG) 100 UNIT/ML FlexPen Inject 10 Units into the skin 3 (three) times daily with meals.  05/31/16  Yes [provider]  insulin glargine (LANTUS) 100 UNIT/ML injection Inject 55 Units into the skin 2 (two) times daily.    Yes [provider]  losartan-hydrochlorothiazide (HYZAAR) 100-25 MG per tablet Take 1 tablet by mouth every morning.   Yes [provider]  metoprolol tartrate (LOPRESSOR) 25 MG tablet Take 25 mg by mouth daily.   Yes [provider]  omeprazole (PRILOSEC) 40 MG capsule Take 40 mg by mouth daily.   Yes [provider]  sertraline (ZOLOFT) 100 MG tablet Take 100 mg by mouth  daily.   Yes [provider]  trazodone (DESYREL) 300 MG tablet Take 300 mg by mouth at bedtime.   Yes [provider]  cloNIDine (CATAPRES) 0.1 MG tablet Take 1 tablet (0.1 mg total) by mouth 2 (two) times daily. Patient not taking: Reported on 06/16/2016 04/15/15   Loletha Grayer, MD  doxycycline (VIBRAMYCIN) 100 MG capsule Take 1 capsule (100 mg total) by mouth 2 (two) times daily. Patient not taking: Reported on 08/27/2016 07/16/16   Jola Schmidt, MD  metoprolol succinate (TOPROL XL) 25 MG 24 hr tablet Take 1 tablet (25 mg total) by mouth daily. Patient not taking: Reported on 08/27/2016 04/15/15   Loletha Grayer, MD  MICONAZOLE NITRATE VAGINAL 4 % CREA Apply to the vulva and vaginal area twice a day Patient not taking: Reported on 08/27/2016 08/22/16   Jeannett Senior, PA-C  nystatin (MYCOSTATIN) 100000 UNIT/ML suspension Take 5 mLs (500,000 Units total) by mouth 4 (four) times daily. Patient not taking: Reported on 06/16/2016 04/15/15   Loletha Grayer, MD  oxyCODONE (OXY IR/ROXICODONE) 5 MG immediate release tablet Take 1 tablet (5 mg total) by mouth every 4 (four) hours as needed for moderate pain. Patient not taking: Reported on 06/16/2016 04/15/15   Loletha Grayer, MD  predniSONE (DELTASONE) 10 MG tablet Take 6 tablets (60 mg total) by mouth daily. Patient not taking: Reported on 08/22/2016 07/16/16   Jola Schmidt, MD    Family History Family History  Problem Relation Age of Onset  . Diabetes Other   . Hypertension Other     Social History Social History  Substance Use Topics  . Smoking status: Former Smoker    Types: Cigarettes  . Smokeless tobacco: Never Used  . Alcohol use Yes     Allergies   Keppra [levetiracetam] and Lisinopril   Review of Systems Review of Systems  Respiratory: Positive for cough and shortness of breath.   All other systems reviewed and are negative.    Physical Exam Updated Vital Signs BP (!) 156/90 (BP Location: Left Arm)    Pulse 67   Temp 98.8 F (37.1 C) (Oral)   Resp 18   SpO2 100%   Physical Exam  Constitutional: She is oriented to person, place, and time.  tachypneic   HENT:  Head: Normocephalic.  MM slightly dry   Eyes: Conjunctivae and EOM are normal. Pupils are equal, round, and reactive to light.  Neck: Normal range of motion. Neck supple.  Cardiovascular: Normal rate, regular rhythm and normal heart sounds.   Pulmonary/Chest:  Tachypneic, mild diffuse wheezing, no crackles. Mild retractions   Abdominal: Soft. Bowel sounds are normal. She exhibits no distension. There is no tenderness.  Musculoskeletal: Normal range of motion. She exhibits no edema.  Mild tenderness L  wrist, no obvious deformity. Mild L knee tenderness but no deformity. Pelvis stable. No spinal tenderness   Neurological: She is alert and oriented to person, place, and time. No cranial nerve deficit. Coordination normal.  Skin: Skin is warm.  Psychiatric: She has a normal mood and affect.  Nursing note and vitals reviewed.    ED Treatments / Results  Labs (all labs ordered are listed, but only abnormal results are displayed) Labs Reviewed  CBC WITH DIFFERENTIAL/PLATELET - Abnormal; Notable for the following:       Result Value   Hemoglobin 11.0 (*)    HCT 34.6 (*)    MCH 25.6 (*)    All other components within normal limits  CBG MONITORING, ED - Abnormal; Notable for the following:    Glucose-Capillary 368 (*)    All other components within normal limits  I-STAT CG4 LACTIC ACID, ED - Abnormal; Notable for the following:    Lactic Acid, Venous 2.64 (*)    All other components within normal limits  CULTURE, BLOOD (ROUTINE X 2)  CULTURE, BLOOD (ROUTINE X 2)  COMPREHENSIVE METABOLIC PANEL  BRAIN NATRIURETIC PEPTIDE  I-STAT TROPOININ, ED    EKG  EKG Interpretation None       Radiology Dg Chest 2 View  Result Date: 08/27/2016 CLINICAL DATA:  Pt complains of SOB and falling while trying to get out of a  chair that resulted in left knee pain, left wrist and and pelvic pain. Hx of DM, HTN, CHF, CAD and asthma. EXAM: CHEST  2 VIEW COMPARISON:  Chest x-ray dated 07/15/2016. FINDINGS: Cardiomegaly is stable. Overall cardiomediastinal silhouette is stable. Atherosclerotic changes noted at the aortic arch. Lungs are clear. No pleural effusion or pneumothorax. No acute or suspicious osseous finding. IMPRESSION: 1. No acute findings.  No evidence of pneumonia or pulmonary edema. 2. Stable cardiomegaly. 3. Aortic atherosclerosis. Electronically Signed   By: Franki Cabot M.D.   On: 08/27/2016 10:34   Dg Pelvis 1-2 Views  Result Date: 08/27/2016 CLINICAL DATA:  Pt complains of SOB and falling while trying to get out of a chair that resulted in left knee pain, left wrist and and pelvic pain. Hx of DM, HTN, CHF, CAD and asthma. EXAM: PELVIS - 1-2 VIEW COMPARISON:  None. FINDINGS: Osseous alignment is normal. No fracture line or displaced fracture fragment seen. No significant degenerative change appreciated at either hip joint or within the lower lumbar spine. Prominent atherosclerotic changes seen within the pelvis and upper thighs. Soft tissues about the pelvis are otherwise unremarkable. IMPRESSION: 1. No acute findings. 2. No significant degenerative change. 3. Atherosclerosis. Electronically Signed   By: Franki Cabot M.D.   On: 08/27/2016 10:31   Dg Wrist Complete Left  Result Date: 08/27/2016 CLINICAL DATA:  Pt complains of SOB and falling while trying to get out of a chair that resulted in left knee pain, left wrist and and pelvic pain. Hx of DM, HTN, CHF, CAD and asthma. EXAM: LEFT WRIST - COMPLETE 3+ VIEW COMPARISON:  None. FINDINGS: There is no evidence of fracture or dislocation. There is no evidence of arthropathy or other focal bone abnormality. Soft tissues are unremarkable. IMPRESSION: Negative. Electronically Signed   By: Franki Cabot M.D.   On: 08/27/2016 10:29   Dg Knee Complete 4 Views  Left  Result Date: 08/27/2016 CLINICAL DATA:  Pt complains of SOB and falling while trying to get out of a chair that resulted in left knee pain, left wrist and and pelvic pain.  Hx of DM, HTN, CHF, CAD and asthma. EXAM: LEFT KNEE - COMPLETE 4+ VIEW COMPARISON:  None. FINDINGS: Mild degenerative narrowing of the medial compartment, with associated osseous spurring. Lateral and patellofemoral compartments appear relatively well preserved. No osseous fracture or dislocation. No acute or suspicious osseous lesion. Prominent atherosclerosis of the distal SFA and popliteal arteries. Soft tissues about the left knee are otherwise unremarkable. No evidence of joint effusion. IMPRESSION: 1. No acute findings. 2. Mild degenerative change. 3. Extensive atherosclerosis. Electronically Signed   By: Franki Cabot M.D.   On: 08/27/2016 10:32    Procedures Procedures (including critical care time)  Medications Ordered in ED Medications  albuterol (PROVENTIL) (2.5 MG/3ML) 0.083% nebulizer solution 5 mg (5 mg Nebulization Given 08/27/16 1044)  methylPREDNISolone sodium succinate (SOLU-MEDROL) 125 mg/2 mL injection 125 mg (125 mg Intravenous Given 08/27/16 1044)  ipratropium (ATROVENT) nebulizer solution 0.5 mg (0.5 mg Nebulization Given 08/27/16 1044)  sodium chloride 0.9 % bolus 1,000 mL (1,000 mLs Intravenous New Bag/Given 08/27/16 1044)     Initial Impression / Assessment and Plan / ED Course  I have reviewed the triage vital signs and the nursing notes.  Pertinent labs & imaging results that were available during my care of the patient were reviewed by me and considered in my medical decision making (see chart for details).     Christine Sims is a 61 y.o. female here with cough, shortness of breath, subjective chills. Consider COPD vs pneumonia, less likely CHF exacerbation. Will get labs, CXR, lactate, cultures. Will give nebs, steroids and reassess.   2:56 PM CXR unremarkable. BNP nl. Glucose 412 on  chemistry. After 2 L NS bolus and 10 U regular insulin, decreased to 400. Patient did eat some food in the ED which likely made it remain elevated. No signs of DKA. Lactate was 2.6 initially, decreased to 1.1 with IVF. UA showed no UTI. Delta trop neg. Given nebs, steroids, doxycyline for COPD exacerbation and felt better. Minimal wheezing now. Never hypoxic on her baseline O2. Patient states that she is not going to take prednisone since it will make her fat. I told her a short course won't do so but she still doesn't want to take it. She received solumedrol in the ED. Will dc home with doxycyline for COPD exacerbation. She has nebs at home. Will give extra 10 U insulin prior to discharge.   Final Clinical Impressions(s) / ED Diagnoses   Final diagnoses:  Pain    New Prescriptions New Prescriptions   No medications on file     Drenda Freeze, MD 08/27/16 1500

## 2016-08-27 NOTE — ED Triage Notes (Signed)
EMS states pt called due to increased SHOB and productive cough over last 2-3 days, [place] reported sputum. Breathing treatment given enroute 5mg  albuterol. Unable to obtain line VS [id number] on room air, home 02 at 2 L [place]. CBG 407

## 2016-08-27 NOTE — ED Triage Notes (Signed)
[facility] called for transport due to pt requiring 02.

## 2016-09-01 LAB — CULTURE, BLOOD (ROUTINE X 2)
CULTURE: NO GROWTH
Culture: NO GROWTH
Special Requests: ADEQUATE
Special Requests: ADEQUATE

## 2017-02-01 ENCOUNTER — Encounter (HOSPITAL_COMMUNITY): Payer: Self-pay | Admitting: Emergency Medicine

## 2017-02-01 ENCOUNTER — Emergency Department (HOSPITAL_COMMUNITY)
Admission: EM | Admit: 2017-02-01 | Discharge: 2017-02-01 | Disposition: A | Discharge status: Home / Self Care | Payer: Medicaid Other | Attending: Emergency Medicine | Admitting: Emergency Medicine

## 2017-02-01 ENCOUNTER — Emergency Department (HOSPITAL_COMMUNITY): Payer: Medicaid Other

## 2017-02-01 DIAGNOSIS — I509 Heart failure, unspecified: Secondary | ICD-10-CM | POA: Diagnosis not present

## 2017-02-01 DIAGNOSIS — I110 Hypertensive heart disease with heart failure: Secondary | ICD-10-CM | POA: Insufficient documentation

## 2017-02-01 DIAGNOSIS — Z79899 Other long term (current) drug therapy: Secondary | ICD-10-CM | POA: Insufficient documentation

## 2017-02-01 DIAGNOSIS — Z7902 Long term (current) use of antithrombotics/antiplatelets: Secondary | ICD-10-CM | POA: Insufficient documentation

## 2017-02-01 DIAGNOSIS — Z87891 Personal history of nicotine dependence: Secondary | ICD-10-CM | POA: Insufficient documentation

## 2017-02-01 DIAGNOSIS — R51 Headache: Secondary | ICD-10-CM | POA: Diagnosis not present

## 2017-02-01 DIAGNOSIS — J441 Chronic obstructive pulmonary disease with (acute) exacerbation: Secondary | ICD-10-CM | POA: Diagnosis not present

## 2017-02-01 DIAGNOSIS — Z794 Long term (current) use of insulin: Secondary | ICD-10-CM | POA: Insufficient documentation

## 2017-02-01 DIAGNOSIS — R519 Headache, unspecified: Secondary | ICD-10-CM

## 2017-02-01 DIAGNOSIS — G8929 Other chronic pain: Secondary | ICD-10-CM

## 2017-02-01 DIAGNOSIS — J45909 Unspecified asthma, uncomplicated: Secondary | ICD-10-CM | POA: Diagnosis not present

## 2017-02-01 DIAGNOSIS — E119 Type 2 diabetes mellitus without complications: Secondary | ICD-10-CM | POA: Insufficient documentation

## 2017-02-01 DIAGNOSIS — R079 Chest pain, unspecified: Secondary | ICD-10-CM | POA: Diagnosis present

## 2017-02-01 LAB — CBC WITH DIFFERENTIAL/PLATELET
BASOS PCT: 0 %
Basophils Absolute: 0 10*3/uL (ref 0.0–0.1)
EOS ABS: 0 10*3/uL (ref 0.0–0.7)
Eosinophils Relative: 1 %
HCT: 33.8 % — ABNORMAL LOW (ref 36.0–46.0)
HEMOGLOBIN: 10.5 g/dL — AB (ref 12.0–15.0)
LYMPHS ABS: 2 10*3/uL (ref 0.7–4.0)
Lymphocytes Relative: 25 %
MCH: 25.7 pg — AB (ref 26.0–34.0)
MCHC: 31.1 g/dL (ref 30.0–36.0)
MCV: 82.8 fL (ref 78.0–100.0)
Monocytes Absolute: 0.5 10*3/uL (ref 0.1–1.0)
Monocytes Relative: 6 %
NEUTROS PCT: 68 %
Neutro Abs: 5.4 10*3/uL (ref 1.7–7.7)
PLATELETS: 337 10*3/uL (ref 150–400)
RBC: 4.08 MIL/uL (ref 3.87–5.11)
RDW: 15.9 % — ABNORMAL HIGH (ref 11.5–15.5)
WBC: 8 10*3/uL (ref 4.0–10.5)

## 2017-02-01 LAB — BASIC METABOLIC PANEL
Anion gap: 9 (ref 5–15)
BUN: 22 mg/dL — AB (ref 6–20)
CHLORIDE: 102 mmol/L (ref 101–111)
CO2: 25 mmol/L (ref 22–32)
CREATININE: 1.25 mg/dL — AB (ref 0.44–1.00)
Calcium: 8.7 mg/dL — ABNORMAL LOW (ref 8.9–10.3)
GFR calc non Af Amer: 46 mL/min — ABNORMAL LOW (ref >60)
GFR, EST AFRICAN AMERICAN: 53 mL/min — AB (ref >60)
Glucose, Bld: 280 mg/dL — ABNORMAL HIGH (ref 65–99)
POTASSIUM: 4 mmol/L (ref 3.5–5.1)
SODIUM: 136 mmol/L (ref 135–145)

## 2017-02-01 LAB — I-STAT TROPONIN, ED: TROPONIN I, POC: 0.01 ng/mL (ref 0.00–0.08)

## 2017-02-01 LAB — BRAIN NATRIURETIC PEPTIDE: B NATRIURETIC PEPTIDE 5: 19 pg/mL (ref 0.0–100.0)

## 2017-02-01 MED ORDER — IPRATROPIUM-ALBUTEROL 0.5-2.5 (3) MG/3ML IN SOLN
3.0000 mL | Freq: Once | RESPIRATORY_TRACT | Status: AC
  Administered 2017-02-01: 3 mL via RESPIRATORY_TRACT
  Filled 2017-02-01: qty 3

## 2017-02-01 MED ORDER — PREDNISONE 10 MG PO TABS: 40.0000 mg | ORAL_TABLET | Freq: Every day | ORAL | 0 refills | Status: AC

## 2017-02-01 MED ORDER — AZITHROMYCIN 250 MG PO TABS: 250.0000 mg | ORAL_TABLET | Freq: Every day | ORAL | 0 refills | Status: DC

## 2017-02-01 MED ORDER — PROCHLORPERAZINE EDISYLATE 5 MG/ML IJ SOLN
10.0000 mg | Freq: Once | INTRAMUSCULAR | Status: AC
  Administered 2017-02-01: 10 mg via INTRAVENOUS
  Filled 2017-02-01: qty 2

## 2017-02-01 MED ORDER — DIPHENHYDRAMINE HCL 50 MG/ML IJ SOLN
25.0000 mg | Freq: Once | INTRAMUSCULAR | Status: AC
  Administered 2017-02-01: 25 mg via INTRAVENOUS
  Filled 2017-02-01: qty 1

## 2017-02-01 MED ORDER — METHYLPREDNISOLONE SODIUM SUCC 125 MG IJ SOLR
125.0000 mg | Freq: Once | INTRAMUSCULAR | Status: AC
  Administered 2017-02-01: 125 mg via INTRAVENOUS
  Filled 2017-02-01: qty 2

## 2017-02-01 MED ORDER — ALBUTEROL (5 MG/ML) CONTINUOUS INHALATION SOLN: 20.0000 mg/h | INHALATION_SOLUTION | Freq: Once | RESPIRATORY_TRACT | Status: DC

## 2017-02-01 MED ORDER — KETOROLAC TROMETHAMINE 15 MG/ML IJ SOLN
15.0000 mg | Freq: Once | INTRAMUSCULAR | Status: AC
  Administered 2017-02-01: 15 mg via INTRAVENOUS
  Filled 2017-02-01: qty 1

## 2017-02-01 NOTE — ED Notes (Signed)
ED Provider at bedside. 

## 2017-02-01 NOTE — ED Notes (Signed)
Pt ambulatory around hallway with walker and standby assistance. Pt's room air SpO2 started at 97% and dropped to 93% while walking. Pt reported sob with exertion. EDP aware.

## 2017-02-01 NOTE — ED Notes (Signed)
Pt verbalizes understanding of d/c instructions. Pt received prescriptions. Pt ambulatory at d/c with all belongings.  

## 2017-02-01 NOTE — ED Provider Notes (Signed)
[facility] EMERGENCY DEPARTMENT Provider Note   CSN: [id number] Arrival date & time: 02/01/17  2111     History   Chief Complaint Chief Complaint  Patient presents with  . Chest Pain  . Shortness of Breath    HPI Christine Sims is a 61 y.o. female.  The history is provided by the patient.  Shortness of Breath  This is a new problem. The average episode lasts 1 day. The current episode started 12 to 24 hours ago. The problem has been gradually worsening. Associated symptoms include headaches, cough, sputum production and wheezing. Pertinent negatives include no fever, no rhinorrhea, no sore throat, no ear pain, no neck pain, no chest pain, no vomiting, no abdominal pain, no rash, no leg pain and no leg swelling. She has tried beta-agonist inhalers for the symptoms. The treatment provided no relief. She has had prior hospitalizations. She has had prior ED visits. Associated medical issues include asthma, COPD and heart failure.  multiple admission for similar symptoms. Endorses chest tightness but denies pain. Also endorses generalized HA. Similar to prior, gradual in onset, denies vision changes, numbness, weakness, or gait problems.  Past Medical History:  Diagnosis Date  . Arthritis   . Asthma   . CHF (congestive heart failure) ([place])   . COPD (chronic obstructive pulmonary disease) ([place])   . Diabetes mellitus without complication ([place])   . Hypercholesterolemia   . Hypertension   . MI (myocardial infarction) ([place])   . Shortness of breath dyspnea   . Stroke [facility])    R sided weakness    Patient Active Problem List   Diagnosis Date Noted  . Community acquired pneumonia 04/04/2015    Past Surgical History:  Procedure Laterality Date  . CARDIAC SURGERY    . FOOT SURGERY     bilateral    OB History    Gravida Para Term Preterm AB Living             2   SAB TAB Ectopic Multiple Live Births                   Home Medications    Prior to  Admission medications   Medication Sig Start Date End Date Taking? Authorizing Provider  albuterol (PROVENTIL HFA;VENTOLIN HFA) 108 (90 Base) MCG/ACT inhaler Inhale 2 puffs into the lungs every 4 (four) hours as needed for wheezing or shortness of breath. 04/15/15  Yes Wieting, Richard, MD  albuterol (PROVENTIL) (2.5 MG/3ML) 0.083% nebulizer solution Take 3 mLs (2.5 mg total) by nebulization every 4 (four) hours as needed. For wheezing. 04/15/15 02/02/23 Yes Wieting, Richard, MD  amLODipine (NORVASC) 10 MG tablet Take 10 mg by mouth daily.   Yes [provider]  atorvastatin (LIPITOR) 80 MG tablet Take 80 mg by mouth daily.    Yes [provider]  budesonide-formoterol (SYMBICORT) 160-4.5 MCG/ACT inhaler Inhale 2 puffs into the lungs 2 (two) times daily. 04/15/15  Yes Wieting, Richard, MD  busPIRone (BUSPAR) 30 MG tablet TAKE ONE TABLET BY MOUTH TWICE DAILY 07/07/16  Yes [provider]  clopidogrel (PLAVIX) 75 MG tablet Take 75 mg by mouth daily.   Yes [provider]  famotidine (PEPCID) 20 MG tablet Take 20 mg by mouth 2 (two) times daily.   Yes [provider]  gabapentin (NEURONTIN) 300 MG capsule Take 900 mg by mouth at bedtime.    Yes [provider]  insulin aspart (NOVOLOG) 100 UNIT/ML FlexPen Inject 10 Units into  the skin 3 (three) times daily with meals.  05/31/16  Yes [provider]  insulin glargine (LANTUS) 100 UNIT/ML injection Inject 55 Units into the skin 2 (two) times daily.    Yes [provider]  losartan-hydrochlorothiazide (HYZAAR) 100-25 MG per tablet Take 1 tablet by mouth every morning.   Yes [provider]  metoprolol succinate (TOPROL XL) 25 MG 24 hr tablet Take 1 tablet (25 mg total) by mouth daily. 04/15/15  Yes Wieting, Richard, MD  omeprazole (PRILOSEC) 40 MG capsule Take 40 mg by mouth daily.   Yes [provider]  sertraline (ZOLOFT) 100 MG tablet Take 100 mg by mouth daily.   Yes  [provider]  trazodone (DESYREL) 300 MG tablet Take 300 mg by mouth at bedtime.   Yes [provider]  azithromycin (ZITHROMAX) 250 MG tablet Take 1 tablet (250 mg total) by mouth daily. Take first 2 tablets together, then 1 every day until finished. 02/01/17   Christean Silvestri Mali, MD  cephALEXin (KEFLEX) 500 MG capsule Take 1 capsule (500 mg total) by mouth 3 (three) times daily. Patient not taking: Reported on 02/01/2017 08/22/16   Jeannett Senior, PA-C  cloNIDine (CATAPRES) 0.1 MG tablet Take 1 tablet (0.1 mg total) by mouth 2 (two) times daily. Patient not taking: Reported on 06/16/2016 04/15/15   Loletha Grayer, MD  doxycycline (VIBRAMYCIN) 100 MG capsule Take 1 capsule (100 mg total) by mouth 2 (two) times daily. Patient not taking: Reported on 02/01/2017 08/27/16   Drenda Freeze, MD  MICONAZOLE NITRATE VAGINAL 4 % CREA Apply to the vulva and vaginal area twice a day Patient not taking: Reported on 02/01/2017 08/22/16   Jeannett Senior, PA-C  nystatin (MYCOSTATIN) 100000 UNIT/ML suspension Take 5 mLs (500,000 Units total) by mouth 4 (four) times daily. Patient not taking: Reported on 02/01/2017 04/15/15   Loletha Grayer, MD  oxyCODONE (OXY IR/ROXICODONE) 5 MG immediate release tablet Take 1 tablet (5 mg total) by mouth every 4 (four) hours as needed for moderate pain. Patient not taking: Reported on 02/01/2017 04/15/15   Loletha Grayer, MD  predniSONE (DELTASONE) 10 MG tablet Take 4 tablets (40 mg total) by mouth daily for 4 days. 02/01/17 02/05/17  Melenie Minniear Mali, MD    Family History Family History  Problem Relation Age of Onset  . Diabetes Other   . Hypertension Other     Social History Social History   Tobacco Use  . Smoking status: Former Smoker    Types: Cigarettes  . Smokeless tobacco: Never Used  Substance Use Topics  . Alcohol use: Yes  . Drug use: No     Allergies   Keppra [levetiracetam] and Lisinopril   Review of  Systems Review of Systems  Constitutional: Negative for chills and fever.  HENT: Negative for ear pain, rhinorrhea and sore throat.   Eyes: Negative for pain and visual disturbance.  Respiratory: Positive for cough, sputum production, chest tightness, shortness of breath and wheezing.   Cardiovascular: Negative for chest pain, palpitations and leg swelling.  Gastrointestinal: Negative for abdominal pain and vomiting.  Genitourinary: Negative for dysuria and hematuria.  Musculoskeletal: Negative for arthralgias, back pain and neck pain.  Skin: Negative for color change and rash.  Neurological: Positive for headaches. Negative for dizziness, seizures, syncope, weakness and numbness.  All other systems reviewed and are negative.    Physical Exam Updated Vital Signs BP (!) 149/81   Pulse 100   Temp 98.8 F (37.1 C) (Oral)  Resp (!) 22   Ht 5\' 4"  (1.626 m)   Wt 110.2 kg (243 lb)   SpO2 98%   BMI 41.71 kg/m   Physical Exam  Constitutional: She is oriented to person, place, and time. She appears well-developed and well-nourished. No distress.  HENT:  Head: Normocephalic and atraumatic.  Eyes: Conjunctivae and EOM are normal. Pupils are equal, round, and reactive to light.  Neck: Neck supple.  Cardiovascular: Regular rhythm and normal pulses. Tachycardia present.  No murmur heard. Pulmonary/Chest: Accessory muscle usage present. Tachypnea noted. No respiratory distress. She has decreased breath sounds. She has wheezes ( faint expiratory wheezing throughout).  Abdominal: Soft. There is no tenderness.  Musculoskeletal: She exhibits no edema.  Neurological: She is alert and oriented to person, place, and time. She has normal strength. No cranial nerve deficit or sensory deficit. Coordination and gait normal. GCS eye subscore is 4. GCS verbal subscore is 5. GCS motor subscore is 6.  Skin: Skin is warm and dry.  Psychiatric: She has a normal mood and affect.  Nursing note and vitals  reviewed.    ED Treatments / Results  Labs (all labs ordered are listed, but only abnormal results are displayed) Labs Reviewed  CBC WITH DIFFERENTIAL/PLATELET - Abnormal; Notable for the following components:      Result Value   Hemoglobin 10.5 (*)    HCT 33.8 (*)    MCH 25.7 (*)    RDW 15.9 (*)    All other components within normal limits  BASIC METABOLIC PANEL - Abnormal; Notable for the following components:   Glucose, Bld 280 (*)    BUN 22 (*)    Creatinine, Ser 1.25 (*)    Calcium 8.7 (*)    GFR calc non Af Amer 46 (*)    GFR calc Af Amer 53 (*)    All other components within normal limits  BRAIN NATRIURETIC PEPTIDE  I-STAT TROPONIN, ED    EKG  EKG Interpretation  Date/Time:  Thursday February 01 2017 21:28:22 EST Ventricular Rate:  106 PR Interval:    QRS Duration: 126 QT Interval:  386 QTC Calculation: 513 R Axis:   5 Text Interpretation:  Sinus tachycardia Left bundle branch block Confirmed by Isla Pence [phone]) on 02/01/2017 11:12:26 PM       Radiology Dg Chest 2 View  Result Date: 02/01/2017 CLINICAL DATA:  Shortness of breath, productive cough, nausea for 1 day. History of COPD. EXAM: CHEST  2 VIEW COMPARISON:  Chest radiograph August 27, 2016 FINDINGS: Cardiac silhouette is mildly enlarged and unchanged. Calcified aortic knob. No pleural effusion or focal consolidation. Blunting of LEFT costophrenic angle. No pneumothorax. Soft tissue planes and included osseous structures are nonsuspicious. IMPRESSION: 1. Stable mild cardiomegaly. 2. Trace LEFT pleural effusion versus pleural thickening. Aortic Atherosclerosis (ICD10-I70.0). Electronically Signed   By: Elon Alas M.D.   On: 02/01/2017 22:15    Procedures Procedures (including critical care time)  Medications Ordered in ED Medications  methylPREDNISolone sodium succinate (SOLU-MEDROL) 125 mg/2 mL injection 125 mg (125 mg Intravenous Given 02/01/17 2147)  ipratropium-albuterol (DUONEB)  0.5-2.5 (3) MG/3ML nebulizer solution 3 mL (3 mLs Nebulization Given 02/01/17 2147)  diphenhydrAMINE (BENADRYL) injection 25 mg (25 mg Intravenous Given 02/01/17 2144)  prochlorperazine (COMPAZINE) injection 10 mg (10 mg Intravenous Given 02/01/17 2146)  ipratropium-albuterol (DUONEB) 0.5-2.5 (3) MG/3ML nebulizer solution 3 mL (3 mLs Nebulization Given 02/01/17 2304)  ketorolac (TORADOL) 15 MG/ML injection 15 mg (15 mg Intravenous Given 02/01/17 2324)  Initial Impression / Assessment and Plan / ED Course  I have reviewed the triage vital signs and the nursing notes.  Pertinent labs & imaging results that were available during my care of the patient were reviewed by me and considered in my medical decision making (see chart for details).     61 year old female with a history of COPD, CHF presenting with 1 day of shortness of breath, chest tightness, cough with yellow sputum production.  She is currently afebrile and hemodynamic is stable.  Tachypneic with accessory muscle use with decreased breath sounds and wheezing on exam.  Chest x-ray ordered.  CBC, BMP, troponin, BNP ordered.  EKG shows sinus tachycardia with LBBB with no acute ischemic changes or dysrhythmias.  Patient given Solu-Medrol and a DuoNeb.  She also complained of a headache.  Normal neuro exam.  Ambulating without difficulty.  Headache similar to prior. Low suspicion for SAH/ICH. No recent head trauma.  Will give Benadryl Compazine and Toradol.  Chest x-ray with no obvious pneumothorax, pneumonia or pulmonary edema.  Patient is not fluid overloaded on exam.  Labs appear to be at her baseline with a normal BNP and a negative troponin.  Patient likely with COPD exacerbation.  As she only has chest tightness with no chest pain and a nonischemic EKG, low suspicion for ACS.  No clinical signs of DVT and history and exam most consistent with COPD and not PE.  After 2 DuoNeb's, the patient's lungs are clear to auscultation bilaterally  she is ambulating without difficulty.  She is typically on 2 L of oxygen and upon ambulation on room air her O2 sat stays 95-97%.  As she has had an increase in sputum production, likely COPD exacerbation.  Will send home with 4 days of prednisone and a Z-Pak.  We will have patient follow-up PCP.  Patient amenable with plan.  Final Clinical Impressions(s) / ED Diagnoses   Final diagnoses:  COPD exacerbation ([place])  Chronic nonintractable headache, unspecified headache type    ED Discharge Orders        Ordered    azithromycin (ZITHROMAX) 250 MG tablet  Daily     02/01/17 2325    predniSONE (DELTASONE) 10 MG tablet  Daily     02/01/17 2325       Yamilette Garretson Mali, MD 02/01/17 1655    Isla Pence, MD 02/05/17 [phone]

## 2017-02-01 NOTE — ED Notes (Signed)
Patient transported to X-ray 

## 2017-02-01 NOTE — ED Triage Notes (Signed)
Per EMS, pt from home. Pt reports cp and sob that started about an hour ago. Pt reports 8/10 chest tightness. Exertional sob and wheezing, productive cough. Hx COPD.

## 2017-02-18 ENCOUNTER — Emergency Department (HOSPITAL_COMMUNITY): Payer: Medicaid Other

## 2017-02-18 ENCOUNTER — Other Ambulatory Visit: Payer: Self-pay

## 2017-02-18 ENCOUNTER — Encounter (HOSPITAL_COMMUNITY): Payer: Self-pay | Admitting: *Deleted

## 2017-02-18 ENCOUNTER — Inpatient Hospital Stay (HOSPITAL_COMMUNITY)
Admission: EM | Admit: 2017-02-18 | Discharge: 2017-02-26 | DRG: 190 | Disposition: A | Discharge status: Home Health | Payer: Medicaid Other | Attending: Internal Medicine | Admitting: Internal Medicine

## 2017-02-18 DIAGNOSIS — Z833 Family history of diabetes mellitus: Secondary | ICD-10-CM | POA: Diagnosis not present

## 2017-02-18 DIAGNOSIS — E7800 Pure hypercholesterolemia, unspecified: Secondary | ICD-10-CM | POA: Diagnosis present

## 2017-02-18 DIAGNOSIS — B9789 Other viral agents as the cause of diseases classified elsewhere: Secondary | ICD-10-CM | POA: Diagnosis present

## 2017-02-18 DIAGNOSIS — Z87891 Personal history of nicotine dependence: Secondary | ICD-10-CM

## 2017-02-18 DIAGNOSIS — Z9981 Dependence on supplemental oxygen: Secondary | ICD-10-CM | POA: Diagnosis not present

## 2017-02-18 DIAGNOSIS — F32A Depression, unspecified: Secondary | ICD-10-CM | POA: Diagnosis present

## 2017-02-18 DIAGNOSIS — I5042 Chronic combined systolic (congestive) and diastolic (congestive) heart failure: Secondary | ICD-10-CM | POA: Diagnosis not present

## 2017-02-18 DIAGNOSIS — Z8673 Personal history of transient ischemic attack (TIA), and cerebral infarction without residual deficits: Secondary | ICD-10-CM

## 2017-02-18 DIAGNOSIS — Z955 Presence of coronary angioplasty implant and graft: Secondary | ICD-10-CM

## 2017-02-18 DIAGNOSIS — I10 Essential (primary) hypertension: Secondary | ICD-10-CM | POA: Diagnosis present

## 2017-02-18 DIAGNOSIS — T380X5A Adverse effect of glucocorticoids and synthetic analogues, initial encounter: Secondary | ICD-10-CM | POA: Diagnosis present

## 2017-02-18 DIAGNOSIS — Z7902 Long term (current) use of antithrombotics/antiplatelets: Secondary | ICD-10-CM

## 2017-02-18 DIAGNOSIS — E669 Obesity, unspecified: Secondary | ICD-10-CM | POA: Diagnosis present

## 2017-02-18 DIAGNOSIS — F419 Anxiety disorder, unspecified: Secondary | ICD-10-CM | POA: Diagnosis present

## 2017-02-18 DIAGNOSIS — J449 Chronic obstructive pulmonary disease, unspecified: Secondary | ICD-10-CM | POA: Diagnosis present

## 2017-02-18 DIAGNOSIS — I130 Hypertensive heart and chronic kidney disease with heart failure and stage 1 through stage 4 chronic kidney disease, or unspecified chronic kidney disease: Secondary | ICD-10-CM | POA: Diagnosis present

## 2017-02-18 DIAGNOSIS — F329 Major depressive disorder, single episode, unspecified: Secondary | ICD-10-CM | POA: Diagnosis present

## 2017-02-18 DIAGNOSIS — N183 Chronic kidney disease, stage 3 unspecified: Secondary | ICD-10-CM | POA: Diagnosis present

## 2017-02-18 DIAGNOSIS — J441 Chronic obstructive pulmonary disease with (acute) exacerbation: Secondary | ICD-10-CM | POA: Diagnosis present

## 2017-02-18 DIAGNOSIS — E1122 Type 2 diabetes mellitus with diabetic chronic kidney disease: Secondary | ICD-10-CM | POA: Diagnosis present

## 2017-02-18 DIAGNOSIS — I252 Old myocardial infarction: Secondary | ICD-10-CM

## 2017-02-18 DIAGNOSIS — G8929 Other chronic pain: Secondary | ICD-10-CM | POA: Diagnosis present

## 2017-02-18 DIAGNOSIS — J9621 Acute and chronic respiratory failure with hypoxia: Secondary | ICD-10-CM | POA: Diagnosis present

## 2017-02-18 DIAGNOSIS — Z79899 Other long term (current) drug therapy: Secondary | ICD-10-CM | POA: Diagnosis not present

## 2017-02-18 DIAGNOSIS — B348 Other viral infections of unspecified site: Secondary | ICD-10-CM | POA: Diagnosis not present

## 2017-02-18 DIAGNOSIS — I2510 Atherosclerotic heart disease of native coronary artery without angina pectoris: Secondary | ICD-10-CM | POA: Diagnosis present

## 2017-02-18 DIAGNOSIS — Z7951 Long term (current) use of inhaled steroids: Secondary | ICD-10-CM

## 2017-02-18 DIAGNOSIS — Z23 Encounter for immunization: Secondary | ICD-10-CM

## 2017-02-18 DIAGNOSIS — Z8249 Family history of ischemic heart disease and other diseases of the circulatory system: Secondary | ICD-10-CM | POA: Diagnosis not present

## 2017-02-18 DIAGNOSIS — E119 Type 2 diabetes mellitus without complications: Secondary | ICD-10-CM

## 2017-02-18 DIAGNOSIS — Z6841 Body Mass Index (BMI) 40.0 and over, adult: Secondary | ICD-10-CM | POA: Diagnosis not present

## 2017-02-18 DIAGNOSIS — E1165 Type 2 diabetes mellitus with hyperglycemia: Secondary | ICD-10-CM | POA: Diagnosis present

## 2017-02-18 DIAGNOSIS — D649 Anemia, unspecified: Secondary | ICD-10-CM | POA: Diagnosis present

## 2017-02-18 DIAGNOSIS — Z794 Long term (current) use of insulin: Secondary | ICD-10-CM | POA: Diagnosis not present

## 2017-02-18 DIAGNOSIS — IMO0001 Reserved for inherently not codable concepts without codable children: Secondary | ICD-10-CM

## 2017-02-18 HISTORY — DX: Essential (primary) hypertension: I10

## 2017-02-18 HISTORY — DX: Major depressive disorder, single episode, unspecified: F32.9

## 2017-02-18 HISTORY — DX: Chronic kidney disease, stage 3 (moderate): N18.3

## 2017-02-18 LAB — CBC WITH DIFFERENTIAL/PLATELET
BASOS PCT: 0 %
Basophils Absolute: 0 10*3/uL (ref 0.0–0.1)
EOS ABS: 0.1 10*3/uL (ref 0.0–0.7)
EOS PCT: 1 %
HCT: 33.4 % — ABNORMAL LOW (ref 36.0–46.0)
HEMOGLOBIN: 10.5 g/dL — AB (ref 12.0–15.0)
LYMPHS ABS: 2.2 10*3/uL (ref 0.7–4.0)
Lymphocytes Relative: 30 %
MCH: 25.8 pg — AB (ref 26.0–34.0)
MCHC: 31.4 g/dL (ref 30.0–36.0)
MCV: 82.1 fL (ref 78.0–100.0)
Monocytes Absolute: 0.6 10*3/uL (ref 0.1–1.0)
Monocytes Relative: 8 %
NEUTROS PCT: 61 %
Neutro Abs: 4.3 10*3/uL (ref 1.7–7.7)
PLATELETS: 267 10*3/uL (ref 150–400)
RBC: 4.07 MIL/uL (ref 3.87–5.11)
RDW: 15.7 % — ABNORMAL HIGH (ref 11.5–15.5)
WBC: 7.1 10*3/uL (ref 4.0–10.5)

## 2017-02-18 LAB — BASIC METABOLIC PANEL
Anion gap: 10 (ref 5–15)
BUN: 14 mg/dL (ref 6–20)
CALCIUM: 9.1 mg/dL (ref 8.9–10.3)
CHLORIDE: 100 mmol/L — AB (ref 101–111)
CO2: 25 mmol/L (ref 22–32)
CREATININE: 1.27 mg/dL — AB (ref 0.44–1.00)
GFR, EST AFRICAN AMERICAN: 52 mL/min — AB (ref >60)
GFR, EST NON AFRICAN AMERICAN: 45 mL/min — AB (ref >60)
Glucose, Bld: 265 mg/dL — ABNORMAL HIGH (ref 65–99)
Potassium: 3.7 mmol/L (ref 3.5–5.1)
SODIUM: 135 mmol/L (ref 135–145)

## 2017-02-18 LAB — TROPONIN I

## 2017-02-18 LAB — INFLUENZA PANEL BY PCR (TYPE A & B)
INFLAPCR: NEGATIVE
INFLBPCR: NEGATIVE

## 2017-02-18 LAB — BRAIN NATRIURETIC PEPTIDE: B NATRIURETIC PEPTIDE 5: 23.6 pg/mL (ref 0.0–100.0)

## 2017-02-18 LAB — CBG MONITORING, ED: Glucose-Capillary: 262 mg/dL — ABNORMAL HIGH (ref 65–99)

## 2017-02-18 MED ORDER — IPRATROPIUM-ALBUTEROL 0.5-2.5 (3) MG/3ML IN SOLN
3.0000 mL | Freq: Once | RESPIRATORY_TRACT | Status: AC
  Administered 2017-02-18: 3 mL via RESPIRATORY_TRACT
  Filled 2017-02-18: qty 3

## 2017-02-18 MED ORDER — ALBUTEROL SULFATE (2.5 MG/3ML) 0.083% IN NEBU
2.5000 mg | INHALATION_SOLUTION | Freq: Once | RESPIRATORY_TRACT | Status: AC
  Administered 2017-02-18: 2.5 mg via RESPIRATORY_TRACT
  Filled 2017-02-18: qty 3

## 2017-02-18 MED ORDER — AZITHROMYCIN 250 MG PO TABS
500.0000 mg | ORAL_TABLET | Freq: Once | ORAL | Status: AC
  Administered 2017-02-18: 500 mg via ORAL
  Filled 2017-02-18: qty 2

## 2017-02-18 MED ORDER — IPRATROPIUM BROMIDE 0.02 % IN SOLN
0.5000 mg | Freq: Once | RESPIRATORY_TRACT | Status: AC
Start: 2017-02-18 — End: 2017-02-18
  Administered 2017-02-18: 0.5 mg via RESPIRATORY_TRACT
  Filled 2017-02-18: qty 2.5

## 2017-02-18 MED ORDER — SODIUM CHLORIDE 0.9 % IV BOLUS (SEPSIS)
500.0000 mL | Freq: Once | INTRAVENOUS | Status: AC
  Administered 2017-02-18: 500 mL via INTRAVENOUS

## 2017-02-18 MED ORDER — ALBUTEROL SULFATE (2.5 MG/3ML) 0.083% IN NEBU
5.0000 mg | INHALATION_SOLUTION | Freq: Once | RESPIRATORY_TRACT | Status: AC
  Administered 2017-02-18: 5 mg via RESPIRATORY_TRACT
  Filled 2017-02-18: qty 6

## 2017-02-18 MED ORDER — MAGNESIUM SULFATE 2 GM/50ML IV SOLN
2.0000 g | Freq: Once | INTRAVENOUS | Status: AC
  Administered 2017-02-18: 2 g via INTRAVENOUS
  Filled 2017-02-18: qty 50

## 2017-02-18 NOTE — ED Notes (Signed)
Peak flow 150 per respiratory

## 2017-02-18 NOTE — ED Notes (Signed)
This RN walked into room and pt's nebulizer treatment was running laying on the table next to her.

## 2017-02-18 NOTE — ED Notes (Signed)
Pt at Xray, states she feels really weak.

## 2017-02-18 NOTE — ED Notes (Signed)
Pt brought to room by X-ray

## 2017-02-18 NOTE — ED Triage Notes (Signed)
The pt has had a cold for 5 days  Her nose is stuffy and she cannot breathe through her nose at night  She thinks she has pneumonia hx asthma  She also wants to be seen for a painful lt knee

## 2017-02-18 NOTE — Progress Notes (Signed)
Attempted to receive report.  ED RN will call back.  Earleen Reaper RN-BC, [employer]

## 2017-02-18 NOTE — ED Provider Notes (Signed)
[facility] EMERGENCY DEPARTMENT Provider Note   CSN: [id number] Arrival date & time: 02/18/17  1724     History   Chief Complaint Chief Complaint  Patient presents with  . Generalized Body Aches    HPI Christine Sims is a 61 y.o. female.  HPI  61 year old female with a history of COPD, CHF, and diabetes presents with concern for pneumonia.  Has had cough and shortness of breath for 5 days.  Has had headache, rhinorrhea, nasal congestion, chest tightness, wheezing, and sore throat.  Has had some body aches and chronic left knee pain.  She states the left knee pain is severe and has been this way for months.  No joint swelling or redness that she is noticed.  She has felt hot and cold chills but has not taken a fever.  Has tried her albuterol with some partial relief.   Past Medical History:  Diagnosis Date  . Arthritis   . Asthma   . CHF (congestive heart failure) ([place])   . COPD (chronic obstructive pulmonary disease) ([place])   . Diabetes mellitus without complication ([place])   . Hypercholesterolemia   . Hypertension   . MI (myocardial infarction) ([place])   . Shortness of breath dyspnea   . Stroke [facility])    R sided weakness    Patient Active Problem List   Diagnosis Date Noted  . COPD (chronic obstructive pulmonary disease) ([place]) 02/18/2017  . Community acquired pneumonia 04/04/2015    Past Surgical History:  Procedure Laterality Date  . CARDIAC SURGERY    . FOOT SURGERY     bilateral    OB History    Gravida Para Term Preterm AB Living             2   SAB TAB Ectopic Multiple Live Births                   Home Medications    Prior to Admission medications   Medication Sig Start Date End Date Taking? Authorizing Provider  acetaminophen (TYLENOL) 500 MG tablet Take 1,000 mg by mouth every 6 (six) hours as needed for headache (pain).   Yes [provider]  albuterol (PROVENTIL) (2.5 MG/3ML) 0.083% nebulizer solution Take 3 mLs (2.5  mg total) by nebulization every 4 (four) hours as needed. For wheezing. Patient taking differently: Take 2.5 mg by nebulization every 4 (four) hours as needed for wheezing or shortness of breath.  04/15/15 02/02/23 Yes Wieting, Richard, MD  amLODipine (NORVASC) 10 MG tablet Take 10 mg by mouth daily.   Yes [provider]  atorvastatin (LIPITOR) 80 MG tablet Take 80 mg by mouth daily.    Yes [provider]  budesonide-formoterol (SYMBICORT) 160-4.5 MCG/ACT inhaler Inhale 2 puffs into the lungs 2 (two) times daily. 04/15/15  Yes Wieting, Richard, MD  busPIRone (BUSPAR) 30 MG tablet TAKE ONE TABLET (30mg ) BY MOUTH TWICE DAILY 07/07/16  Yes [provider]  clopidogrel (PLAVIX) 75 MG tablet Take 75 mg by mouth daily.   Yes [provider]  gabapentin (NEURONTIN) 300 MG capsule Take 900 mg by mouth at bedtime.    Yes [provider]  insulin aspart (NOVOLOG FLEXPEN) 100 UNIT/ML FlexPen Inject 30 Units into the skin 3 (three) times daily with meals.   Yes [provider]  insulin glargine (LANTUS) 100 unit/mL SOPN Inject 60 Units into the skin 2 (two) times daily.   Yes [provider]  OXYGEN Inhale 2 L  into the lungs continuous.   Yes [provider]  trazodone (DESYREL) 300 MG tablet Take 300 mg by mouth at bedtime.   Yes [provider]  albuterol (PROVENTIL HFA;VENTOLIN HFA) 108 (90 Base) MCG/ACT inhaler Inhale 2 puffs into the lungs every 4 (four) hours as needed for wheezing or shortness of breath. Patient not taking: Reported on 02/18/2017 04/15/15   Loletha Grayer, MD  cloNIDine (CATAPRES) 0.1 MG tablet Take 1 tablet (0.1 mg total) by mouth 2 (two) times daily. Patient not taking: Reported on 06/16/2016 04/15/15   Loletha Grayer, MD  famotidine (PEPCID) 20 MG tablet Take 20 mg by mouth 2 (two) times daily.    [provider]  losartan-hydrochlorothiazide (HYZAAR) 100-25 MG per tablet Take 1 tablet by mouth every  morning.    [provider]  metoprolol succinate (TOPROL XL) 25 MG 24 hr tablet Take 1 tablet (25 mg total) by mouth daily. 04/15/15   Loletha Grayer, MD  omeprazole (PRILOSEC) 40 MG capsule Take 40 mg by mouth daily.    [provider]  sertraline (ZOLOFT) 100 MG tablet Take 100 mg by mouth daily.    [provider]    Family History Family History  Problem Relation Age of Onset  . Diabetes Other   . Hypertension Other     Social History Social History   Tobacco Use  . Smoking status: Former Smoker    Types: Cigarettes  . Smokeless tobacco: Never Used  Substance Use Topics  . Alcohol use: Yes  . Drug use: No     Allergies   Keppra [levetiracetam] and Lisinopril   Review of Systems Review of Systems  Constitutional: Positive for chills and fever (subjective).  HENT: Positive for congestion, rhinorrhea and sore throat.   Respiratory: Positive for shortness of breath and wheezing.   Cardiovascular: Positive for leg swelling (chronic, unchanged). Negative for chest pain.  Gastrointestinal: Positive for vomiting.  Musculoskeletal: Positive for myalgias.  Neurological: Positive for headaches.  All other systems reviewed and are negative.    Physical Exam Updated Vital Signs BP 115/67   Pulse 90   Temp 98.5 F (36.9 C) (Oral)   Resp (!) 24   Ht 5\' 4"  (1.626 m)   Wt 108 kg (238 lb)   SpO2 97%   BMI 40.85 kg/m   Physical Exam  Constitutional: She is oriented to person, place, and time. She appears well-developed and well-nourished. No distress.  obese  HENT:  Head: Normocephalic and atraumatic.  Right Ear: External ear normal.  Left Ear: External ear normal.  Nose: Nose normal.  Mouth/Throat: Oropharynx is clear and moist. No oropharyngeal exudate.  Eyes: Right eye exhibits no discharge. Left eye exhibits no discharge.  Cardiovascular: Normal rate, regular rhythm and normal heart sounds.  Pulmonary/Chest: Effort normal. No  accessory muscle usage. Tachypnea noted. She has decreased breath sounds in the right lower field and the left lower field. She has wheezes (diffuse, expiratory).  Abdominal: Soft. There is no tenderness.  Musculoskeletal:       Left knee: She exhibits normal range of motion, no swelling, no effusion and no erythema. Tenderness found.  Neurological: She is alert and oriented to person, place, and time.  Skin: Skin is warm and dry. She is not diaphoretic.  Nursing note and vitals reviewed.    ED Treatments / Results  Labs (all labs ordered are listed, but only abnormal results are displayed) Labs Reviewed  BASIC METABOLIC PANEL - Abnormal; Notable for the  following components:      Result Value   Chloride 100 (*)    Glucose, Bld 265 (*)    Creatinine, Ser 1.27 (*)    GFR calc non Af Amer 45 (*)    GFR calc Af Amer 52 (*)    All other components within normal limits  CBC WITH DIFFERENTIAL/PLATELET - Abnormal; Notable for the following components:   Hemoglobin 10.5 (*)    HCT 33.4 (*)    MCH 25.8 (*)    RDW 15.7 (*)    All other components within normal limits  CBG MONITORING, ED - Abnormal; Notable for the following components:   Glucose-Capillary 262 (*)    All other components within normal limits  RESPIRATORY PANEL BY PCR  TROPONIN I  BRAIN NATRIURETIC PEPTIDE  INFLUENZA PANEL BY PCR (TYPE A & B)    EKG  EKG Interpretation  Date/Time:  Sunday February 18 2017 19:31:30 EST Ventricular Rate:  90 PR Interval:    QRS Duration: 92 QT Interval:  388 QTC Calculation: 475 R Axis:   10 Text Interpretation:  Sinus rhythm no significant change since Nov 2018 Confirmed by Sherwood Gambler [phone]) on 02/18/2017 7:38:26 PM       Radiology Dg Chest 2 View  Result Date: 02/18/2017 CLINICAL DATA:  Shortness of breath and pneumonia. EXAM: CHEST  2 VIEW COMPARISON:  February 01, 2017 FINDINGS: Cardiomegaly. The hila and mediastinum are normal. No nodules or masses. No focal  infiltrates. Possible tiny effusions based on the lateral view. IMPRESSION: Cardiomegaly. Probable tiny pleural effusions. No other acute abnormalities. Electronically Signed   By: Dorise Bullion III M.D   On: 02/18/2017 18:34    Procedures Procedures (including critical care time)  Medications Ordered in ED Medications  sodium chloride 0.9 % bolus 500 mL (0 mLs Intravenous Stopped 02/18/17 2054)  ipratropium-albuterol (DUONEB) 0.5-2.5 (3) MG/3ML nebulizer solution 3 mL (3 mLs Nebulization Given 02/18/17 1949)  albuterol (PROVENTIL) (2.5 MG/3ML) 0.083% nebulizer solution 2.5 mg (2.5 mg Nebulization Given 02/18/17 1949)  magnesium sulfate IVPB 2 g 50 mL (0 g Intravenous Stopped 02/18/17 2054)  albuterol (PROVENTIL) (2.5 MG/3ML) 0.083% nebulizer solution 5 mg (5 mg Nebulization Given 02/18/17 2110)  ipratropium (ATROVENT) nebulizer solution 0.5 mg (0.5 mg Nebulization Given 02/18/17 2110)  azithromycin (ZITHROMAX) tablet 500 mg (500 mg Oral Given 02/18/17 2130)     Initial Impression / Assessment and Plan / ED Course  I have reviewed the triage vital signs and the nursing notes.  Pertinent labs & imaging results that were available during my care of the patient were reviewed by me and considered in my medical decision making (see chart for details).     Patient has not significant improved with multiple albuterol nebs. She will be covered for COPD exacerbation with antibiotics. She had declined steroids because of a lytic or feeling the past and troubles with hyperglycemia. I have discussed that while could make her hypoglycemic it does tend to help acute bronchitis exacerbations. She understands but still declines. She was given magnesium and treatments. She is not hypoxic but does still have tachypnea and trouble breathing and does not appear stable for discharge home. Admit to the hospitalist.  Final Clinical Impressions(s) / ED Diagnoses   Final diagnoses:  COPD with acute  exacerbation [facility])    ED Discharge Orders    None       Sherwood Gambler, MD 02/19/17 0021

## 2017-02-18 NOTE — ED Notes (Signed)
RT notified of need for peak flow

## 2017-02-19 ENCOUNTER — Encounter (HOSPITAL_COMMUNITY): Payer: Self-pay | Admitting: *Deleted

## 2017-02-19 ENCOUNTER — Other Ambulatory Visit: Payer: Self-pay

## 2017-02-19 DIAGNOSIS — J441 Chronic obstructive pulmonary disease with (acute) exacerbation: Principal | ICD-10-CM

## 2017-02-19 DIAGNOSIS — J449 Chronic obstructive pulmonary disease, unspecified: Secondary | ICD-10-CM

## 2017-02-19 LAB — BASIC METABOLIC PANEL
Anion gap: 8 (ref 5–15)
BUN: 15 mg/dL (ref 6–20)
CALCIUM: 8.9 mg/dL (ref 8.9–10.3)
CO2: 25 mmol/L (ref 22–32)
CREATININE: 1.26 mg/dL — AB (ref 0.44–1.00)
Chloride: 103 mmol/L (ref 101–111)
GFR, EST AFRICAN AMERICAN: 53 mL/min — AB (ref >60)
GFR, EST NON AFRICAN AMERICAN: 45 mL/min — AB (ref >60)
Glucose, Bld: 369 mg/dL — ABNORMAL HIGH (ref 65–99)
Potassium: 4.3 mmol/L (ref 3.5–5.1)
Sodium: 136 mmol/L (ref 135–145)

## 2017-02-19 LAB — RESPIRATORY PANEL BY PCR
Adenovirus: NOT DETECTED
BORDETELLA PERTUSSIS-RVPCR: NOT DETECTED
CHLAMYDOPHILA PNEUMONIAE-RVPPCR: NOT DETECTED
CORONAVIRUS OC43-RVPPCR: NOT DETECTED
Coronavirus 229E: NOT DETECTED
Coronavirus HKU1: NOT DETECTED
Coronavirus NL63: NOT DETECTED
INFLUENZA A-RVPPCR: NOT DETECTED
INFLUENZA B-RVPPCR: NOT DETECTED
MYCOPLASMA PNEUMONIAE-RVPPCR: NOT DETECTED
Metapneumovirus: NOT DETECTED
PARAINFLUENZA VIRUS 1-RVPPCR: NOT DETECTED
PARAINFLUENZA VIRUS 4-RVPPCR: NOT DETECTED
Parainfluenza Virus 2: NOT DETECTED
Parainfluenza Virus 3: NOT DETECTED
RESPIRATORY SYNCYTIAL VIRUS-RVPPCR: NOT DETECTED
Rhinovirus / Enterovirus: DETECTED — AB

## 2017-02-19 LAB — CBC
HCT: 31.3 % — ABNORMAL LOW (ref 36.0–46.0)
Hemoglobin: 9.7 g/dL — ABNORMAL LOW (ref 12.0–15.0)
MCH: 25.3 pg — AB (ref 26.0–34.0)
MCHC: 31 g/dL (ref 30.0–36.0)
MCV: 81.7 fL (ref 78.0–100.0)
PLATELETS: 260 10*3/uL (ref 150–400)
RBC: 3.83 MIL/uL — AB (ref 3.87–5.11)
RDW: 15.9 % — ABNORMAL HIGH (ref 11.5–15.5)
WBC: 7 10*3/uL (ref 4.0–10.5)

## 2017-02-19 LAB — GLUCOSE, CAPILLARY
GLUCOSE-CAPILLARY: 239 mg/dL — AB (ref 65–99)
GLUCOSE-CAPILLARY: 330 mg/dL — AB (ref 65–99)
GLUCOSE-CAPILLARY: 371 mg/dL — AB (ref 65–99)
GLUCOSE-CAPILLARY: 379 mg/dL — AB (ref 65–99)
Glucose-Capillary: 405 mg/dL — ABNORMAL HIGH (ref 65–99)

## 2017-02-19 LAB — HIV ANTIBODY (ROUTINE TESTING W REFLEX): HIV SCREEN 4TH GENERATION: NONREACTIVE

## 2017-02-19 MED ORDER — LOSARTAN POTASSIUM 50 MG PO TABS
100.0000 mg | ORAL_TABLET | Freq: Every day | ORAL | Status: DC
  Administered 2017-02-19 – 2017-02-26 (×8): 100 mg via ORAL
  Filled 2017-02-19 (×8): qty 2

## 2017-02-19 MED ORDER — METOPROLOL SUCCINATE ER 25 MG PO TB24
25.0000 mg | ORAL_TABLET | Freq: Every day | ORAL | Status: DC
Start: 2017-02-19 — End: 2017-02-27
  Administered 2017-02-19 – 2017-02-26 (×8): 25 mg via ORAL
  Filled 2017-02-19 (×8): qty 1

## 2017-02-19 MED ORDER — ENOXAPARIN SODIUM 40 MG/0.4ML ~~LOC~~ SOLN
40.0000 mg | SUBCUTANEOUS | Status: DC
  Administered 2017-02-19 – 2017-02-22 (×4): 40 mg via SUBCUTANEOUS
  Filled 2017-02-19 (×4): qty 0.4

## 2017-02-19 MED ORDER — TRAZODONE HCL 100 MG PO TABS
300.0000 mg | ORAL_TABLET | Freq: Every evening | ORAL | Status: DC | PRN
  Administered 2017-02-19 – 2017-02-25 (×7): 300 mg via ORAL
  Filled 2017-02-19 (×7): qty 3

## 2017-02-19 MED ORDER — INFLUENZA VAC SPLIT QUAD 0.5 ML IM SUSY
0.5000 mL | PREFILLED_SYRINGE | Freq: Once | INTRAMUSCULAR | Status: AC
  Administered 2017-02-19: 0.5 mL via INTRAMUSCULAR

## 2017-02-19 MED ORDER — SODIUM CHLORIDE 0.9% FLUSH
3.0000 mL | Freq: Two times a day (BID) | INTRAVENOUS | Status: DC
  Administered 2017-02-19 – 2017-02-23 (×7): 3 mL via INTRAVENOUS
  Administered 2017-02-23: 10 mL via INTRAVENOUS
  Administered 2017-02-24 – 2017-02-26 (×5): 3 mL via INTRAVENOUS

## 2017-02-19 MED ORDER — DM-GUAIFENESIN ER 30-600 MG PO TB12
1.0000 | ORAL_TABLET | Freq: Two times a day (BID) | ORAL | Status: DC
  Administered 2017-02-19 – 2017-02-25 (×13): 1 via ORAL
  Filled 2017-02-19 (×13): qty 1

## 2017-02-19 MED ORDER — HYDROCHLOROTHIAZIDE 25 MG PO TABS
25.0000 mg | ORAL_TABLET | Freq: Every day | ORAL | Status: DC
  Administered 2017-02-19 – 2017-02-26 (×8): 25 mg via ORAL
  Filled 2017-02-19 (×8): qty 1

## 2017-02-19 MED ORDER — DOXYCYCLINE HYCLATE 100 MG PO TABS
100.0000 mg | ORAL_TABLET | Freq: Two times a day (BID) | ORAL | Status: DC
  Administered 2017-02-19 – 2017-02-22 (×8): 100 mg via ORAL
  Filled 2017-02-19 (×8): qty 1

## 2017-02-19 MED ORDER — INSULIN GLARGINE 100 UNIT/ML ~~LOC~~ SOLN
35.0000 [IU] | Freq: Every day | SUBCUTANEOUS | Status: DC
  Administered 2017-02-19: 35 [IU] via SUBCUTANEOUS
  Filled 2017-02-19: qty 0.35

## 2017-02-19 MED ORDER — INSULIN ASPART 100 UNIT/ML ~~LOC~~ SOLN
0.0000 [IU] | Freq: Three times a day (TID) | SUBCUTANEOUS | Status: DC
  Administered 2017-02-19: 20 [IU] via SUBCUTANEOUS
  Administered 2017-02-19: 6 [IU] via SUBCUTANEOUS
  Administered 2017-02-19: 15 [IU] via SUBCUTANEOUS
  Administered 2017-02-20: 20 [IU] via SUBCUTANEOUS

## 2017-02-19 MED ORDER — LOSARTAN POTASSIUM-HCTZ 100-25 MG PO TABS: 1.0000 | ORAL_TABLET | Freq: Every morning | ORAL | Status: DC

## 2017-02-19 MED ORDER — AMLODIPINE BESYLATE 10 MG PO TABS
10.0000 mg | ORAL_TABLET | Freq: Every day | ORAL | Status: DC
  Administered 2017-02-19 – 2017-02-26 (×8): 10 mg via ORAL
  Filled 2017-02-19 (×8): qty 1

## 2017-02-19 MED ORDER — PREDNISONE 5 MG/ML PO CONC
40.0000 mg | Freq: Every day | ORAL | Status: DC
  Filled 2017-02-19: qty 8

## 2017-02-19 MED ORDER — INSULIN GLARGINE 100 UNIT/ML ~~LOC~~ SOLN
60.0000 [IU] | Freq: Every day | SUBCUTANEOUS | Status: DC
  Administered 2017-02-19: 60 [IU] via SUBCUTANEOUS
  Filled 2017-02-19: qty 0.6

## 2017-02-19 MED ORDER — IPRATROPIUM-ALBUTEROL 0.5-2.5 (3) MG/3ML IN SOLN
3.0000 mL | Freq: Four times a day (QID) | RESPIRATORY_TRACT | Status: DC
  Administered 2017-02-19 (×2): 3 mL via RESPIRATORY_TRACT
  Filled 2017-02-19 (×2): qty 3

## 2017-02-19 MED ORDER — METHYLPREDNISOLONE SODIUM SUCC 125 MG IJ SOLR: 60.0000 mg | Freq: Four times a day (QID) | INTRAMUSCULAR | Status: DC

## 2017-02-19 MED ORDER — PANTOPRAZOLE SODIUM 40 MG PO TBEC
40.0000 mg | DELAYED_RELEASE_TABLET | Freq: Every day | ORAL | Status: DC
  Administered 2017-02-19 – 2017-02-26 (×8): 40 mg via ORAL
  Filled 2017-02-19 (×8): qty 1

## 2017-02-19 MED ORDER — IPRATROPIUM-ALBUTEROL 0.5-2.5 (3) MG/3ML IN SOLN
3.0000 mL | Freq: Three times a day (TID) | RESPIRATORY_TRACT | Status: DC
  Administered 2017-02-19: 3 mL via RESPIRATORY_TRACT
  Filled 2017-02-19: qty 3

## 2017-02-19 MED ORDER — ATORVASTATIN CALCIUM 80 MG PO TABS
80.0000 mg | ORAL_TABLET | Freq: Every day | ORAL | Status: DC
  Administered 2017-02-19 – 2017-02-26 (×8): 80 mg via ORAL
  Filled 2017-02-19 (×8): qty 1

## 2017-02-19 MED ORDER — GABAPENTIN 300 MG PO CAPS
300.0000 mg | ORAL_CAPSULE | Freq: Every day | ORAL | Status: DC
  Administered 2017-02-19 – 2017-02-25 (×8): 300 mg via ORAL
  Filled 2017-02-19 (×8): qty 1

## 2017-02-19 MED ORDER — IPRATROPIUM-ALBUTEROL 0.5-2.5 (3) MG/3ML IN SOLN
3.0000 mL | Freq: Four times a day (QID) | RESPIRATORY_TRACT | Status: DC
  Administered 2017-02-19: 3 mL via RESPIRATORY_TRACT
  Filled 2017-02-19: qty 3

## 2017-02-19 MED ORDER — IPRATROPIUM-ALBUTEROL 0.5-2.5 (3) MG/3ML IN SOLN
3.0000 mL | Freq: Three times a day (TID) | RESPIRATORY_TRACT | Status: DC
  Administered 2017-02-20: 3 mL via RESPIRATORY_TRACT
  Filled 2017-02-19: qty 3

## 2017-02-19 MED ORDER — LORATADINE 10 MG PO TABS
10.0000 mg | ORAL_TABLET | Freq: Every day | ORAL | Status: DC
  Administered 2017-02-19 – 2017-02-26 (×8): 10 mg via ORAL
  Filled 2017-02-19 (×8): qty 1

## 2017-02-19 MED ORDER — BUSPIRONE HCL 15 MG PO TABS
30.0000 mg | ORAL_TABLET | Freq: Two times a day (BID) | ORAL | Status: DC
  Administered 2017-02-19 – 2017-02-20 (×3): 30 mg via ORAL
  Filled 2017-02-19 (×3): qty 2

## 2017-02-19 MED ORDER — ACETAMINOPHEN 325 MG PO TABS
650.0000 mg | ORAL_TABLET | Freq: Four times a day (QID) | ORAL | Status: DC | PRN
  Administered 2017-02-19 – 2017-02-25 (×7): 650 mg via ORAL
  Filled 2017-02-19 (×8): qty 2

## 2017-02-19 MED ORDER — PREDNISONE 20 MG PO TABS
40.0000 mg | ORAL_TABLET | Freq: Every day | ORAL | Status: DC
  Administered 2017-02-19: 40 mg via ORAL
  Filled 2017-02-19: qty 2

## 2017-02-19 MED ORDER — INSULIN ASPART 100 UNIT/ML ~~LOC~~ SOLN
6.0000 [IU] | Freq: Three times a day (TID) | SUBCUTANEOUS | Status: DC
  Administered 2017-02-19 – 2017-02-20 (×4): 6 [IU] via SUBCUTANEOUS

## 2017-02-19 MED ORDER — SERTRALINE HCL 100 MG PO TABS
100.0000 mg | ORAL_TABLET | Freq: Every day | ORAL | Status: DC
Start: 2017-02-19 — End: 2017-02-20
  Administered 2017-02-19 – 2017-02-20 (×2): 100 mg via ORAL
  Filled 2017-02-19 (×2): qty 1

## 2017-02-19 MED ORDER — CLOPIDOGREL BISULFATE 75 MG PO TABS
75.0000 mg | ORAL_TABLET | Freq: Every day | ORAL | Status: DC
  Administered 2017-02-19 – 2017-02-26 (×8): 75 mg via ORAL
  Filled 2017-02-19 (×8): qty 1

## 2017-02-19 MED ORDER — METHYLPREDNISOLONE SODIUM SUCC 125 MG IJ SOLR
60.0000 mg | Freq: Three times a day (TID) | INTRAMUSCULAR | Status: DC
  Administered 2017-02-19 – 2017-02-20 (×4): 60 mg via INTRAVENOUS
  Filled 2017-02-19 (×4): qty 2

## 2017-02-19 MED ORDER — PHENOL 1.4 % MT LIQD: 1.0000 | OROMUCOSAL | Status: DC | PRN

## 2017-02-19 NOTE — Progress Notes (Signed)
NP paged x 2 for CBG of 405. Awaiting return call.

## 2017-02-19 NOTE — Progress Notes (Addendum)
Inpatient Diabetes Program Recommendations  AACE/ADA: New Consensus Statement on Inpatient Glycemic Control (2015)  Target Ranges:  Prepandial:   less than 140 mg/dL      Peak postprandial:   less than 180 mg/dL (1-2 hours)      Critically ill patients:  140 - 180 mg/dL   Lab Results  Component Value Date   GLUCAP 379 (H) 02/19/2017   HGBA1C 10.3 (H) 04/04/2015    Review of Glycemic Control Results for Christine Sims, Christine Sims (MRN [id number]) as of 02/19/2017 11:25  Ref. Range 08/27/2016 11:03 08/27/2016 14:55 02/18/2017 20:29 02/19/2017 00:45 02/19/2017 07:54  Glucose-Capillary Latest Ref Range: 65 - 99 mg/dL 368 (H) 402 (H) 262 (H) 239 (H) 379 (H)   Diabetes history: DM2 Outpatient Diabetes medications: Lantus 60 units bid + Novolog 30 units tid meal coverage Current orders for Inpatient glycemic control: Lantus 60 units qd + Novolog 6 units tid meal coverage + Novolog resistance scale tid  Inpatient Diabetes Program Recommendations:    Noted pending A1c. -Decrease Novolog correction to moderate -Increase Lantus to 60 units bid -Increase Novolog to 15 units tid if eats 50%  Thank you, Bethena Roys E. Lavon Horn, RN, MSN, CDE  Diabetes Coordinator Inpatient Glycemic Control Team Team Pager [phone] (8am-5pm) 02/19/2017 11:27 AM

## 2017-02-19 NOTE — Care Management Note (Signed)
Case Management Note  Patient Details  Name: Christine Sims MRN: [id number] Date of Birth: 1955-06-28  Subjective/Objective:     CM following for progression and d/c planning.                Action/Plan: 02/19/2017 Met with pt re DME needs. Pt requesting larger 3:1 however her weight does not qualify her for the larger size. Pt now requesting standard size 3:1, rollator and tub bench . All ordered for delivery to room. Text sent to Dr Carolin Sicks requesting order for HHPT.   Expected Discharge Date:  02/22/17               Expected Discharge Plan:  [place]  In-House Referral:  NA  Discharge planning Services  CM Consult  Post Acute Care Choice:  Durable Medical Equipment, Home Health Choice offered to:  Patient  DME Arranged:  Tub bench, Walker rolling with seat, 3-N-1 DME Agency:  [place]:  PT [place]:  [place]  Status of Service:  Completed, signed off  If discussed at [place] of Stay Meetings, dates discussed:    Additional Comments:  Adron Bene, RN 02/19/2017, 4:38 PM

## 2017-02-19 NOTE — H&P (Signed)
History and Physical   Christine Sims XBD:[id number] DOB: 1955-04-01 DOA: 02/18/2017  PCP: Kristopher Glee., MD  Chief Complaint: Shortness of breath  HPI: This 62 year old obese woman with chronic hypoxic respiratory failure on 2 L nasal cannula O2, reportedly COPD, congestive heart failure unknown diastolic or systolic status, insulin-dependent diabetes, CAD with PCI over decade ago, history of CVA with some speech difficulty and residual right-sided weakness, who presents with shortness of breath.  Patient reports she was in her usual state of health until 4-5 days ago when she started have worsening dyspnea, most notably productive cough of purulent sputum. She reports she takes Symbicort on a daily basis for her COPD. Other symptoms include upper airway congestion, sore throat, chest tightness. She reports that this felt like the same thing about a year ago when she was hospitalized for pneumonia per her report.  She lives at home with her daughter, in [place], reports being independent in her ADLs, needs assistance with her IADLs. She walks with a 4 wheeled walker.  She reports more wheezing.  Due to her symptoms are failing to improve she decided to seek medical attention.  ED Course: The emergency department she had elevated respiratory rate (26) which normalized with treatment, normal heart rate, systolic blood pressure ranging in the 120 to 140s. CBC remarkable for normocytic anemia with hemoglobin of 10.5. Creatinine 1.27, blood sugar 265. Chest x-ray did not reveal acute infiltrate, did show cardiomegaly, BNP of 23.  She was given nebulized bronchodilators, azithromycin, and magnesium.  Hospital medicine consulted for further management.  Review of Systems: A complete ROS was obtained; pertinent positives negatives are denoted in the HPI. Otherwise, all systems are negative.   Past Medical History:  Diagnosis Date  . Arthritis   . Asthma   . CHF (congestive heart failure) ([place])    . COPD (chronic obstructive pulmonary disease) ([place])   . Diabetes mellitus without complication ([place])   . Hypercholesterolemia   . Hypertension   . MI (myocardial infarction) ([place])   . Shortness of breath dyspnea   . Stroke [facility])    R sided weakness   Social History   Socioeconomic History  . Marital status: Married    Spouse name: Not on file  . Number of children: Not on file  . Years of education: Not on file  . Highest education level: Not on file  Social Needs  . Financial resource strain: Not on file  . Food insecurity - worry: Not on file  . Food insecurity - inability: Not on file  . Transportation needs - medical: Not on file  . Transportation needs - non-medical: Not on file  Occupational History  . Not on file  Tobacco Use  . Smoking status: Former Smoker    Types: Cigarettes  . Smokeless tobacco: Never Used  Substance and Sexual Activity  . Alcohol use: Yes  . Drug use: No  . Sexual activity: Not on file  Other Topics Concern  . Not on file  Social History Narrative  . Not on file   Family History  Problem Relation Age of Onset  . Diabetes Other   . 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 No M/R/G. Trace bilateral lower extremity edema Respiratory: Diffuse expiratory wheezes present, adequate air entry, is able to speak complete sentences; however, not during coughing spell which was present during history gathering Abdomen: Soft, non-tender. Bowel sounds present.  Generous pannus Skin: No rash or induration seen on limited exam.  Musculoskeletal: Grossly normal  tone BUE/BLE. Appropriate ROM.  Psychiatric: Grossly normal mood and affect. Neurologic: Moves all extremities in coordinated fashion.  I have personally reviewed the following labs, culture data, and imaging studies.  Assessment/Plan:  Acute COPD exacerbation Symptoms of worsening dyspnea, increased cough frequency, increasing sputum purulence present.  Uncertain what baseline FEV1 is, she is a former smoker in the distant past, possibly concomitant element of bronchitis.   Plan: -will schedule bronchodilators with duo-nebs q 6 hrs + steroid burst (prednisone 40 mg po x 45 days) + doxycycline 5 d course -respiratory viral panel  -holding symbicort, add back when off scheduled duonebs -respiratory therapy, incentive spirometry -would benefit from outpatient PFTs   Other problems -Chronic hypoxic respiratory failure: patient reports being on 2 L [place] O2 chronically, O2 requirement not increased at this time -Hx of CHF, unknown type:  BNP not elevated; however, obesity can cause lower BNP; nonetheless, would favor treating COPD exacerbation and if fails to improve, can consider TTE and/or trial of more aggressive diuresis -HTN / Hx of CVA / CAD : continue home ARB-HCTZ, amlodipine, clopidogrel, BB -Insulin dependent DM: last A1c of 10.3% in 03/2015; patient reports taking lantus 60 units bid along with rapid acting insulin 10 units with meals; will place on slightly lower basal insulin dose with lantus 35 units BID along with scheduled 6 units of rapid acting insulin with meals with correction factor -Obesity: recommend outpatient weight reduction -Anxiety / depression: continue home SSRI and gabapentin (reduced dose gabapentin until can confirm accurate dose)  DVT prophylaxis: Subq Lovenox Code Status: full code after discussion on admission Disposition Plan: Anticipate D/C home in 2-5 days Consults called: none; but may benefit from PT /OT while inpatient Admission status: admit to hospital  medicine  Cheri Rous, MD [facility] Page:[phone]  If 7PM-7AM, please contact night-coverage [email or web address] Password [email or web address]

## 2017-02-19 NOTE — Progress Notes (Addendum)
Patient was seen and examined at bedside.  She was admitted after midnight.  Please see H&P for detail.  62 year old obese female with history of COPD, chronic respiratory failure with hypoxia on 2 L of oxygen at home, chronic diastolic and systolic congestive heart failure, insulin-dependent diabetes, coronary artery disease status post stent, history of stroke, presented with shortness of breath.  Chest x-ray unremarkable.  Patient also reported runny nose, congestion, loose bowel movement concerning for viral etiology.  Influenza negative.  Follow-up respiratory viral panel.  Continue droplet precaution.  Patient has diffuse wheezing therefore changing prednisone to IV Solu-Medrol.  Start Claritin, Mucinex DM, DuoNeb nebulization.  No sign of congestive heart failure exacerbation.  Continue to monitor.  Continue current medical and supportive care. Change insulin to home dose, Lantus 60 units twice a day. Check legionella PT OT evaluation. Discussed with the patient, nursing staff and in progressive around in the morning. DVT prophylaxis with Lovenox subcutaneous.

## 2017-02-19 NOTE — Care Management Note (Signed)
Case Management Note  Patient Details  Name: Christine Sims MRN: [id number] Date of Birth: [birth year]  Subjective/Objective:    CM following for progression and d/c planning.                 Action/Plan: 02/19/2017 Per PT/OT eval this pt needs a tub bench, rollator walker and large 3:1. Per [facility] this pt will not qualify for a large 3:1 as her weight does not qualify. Have ordered Rollator and tub bench.  Per [facility] this pt would qualify for a few HHPT visits. Have asked attending to order this.     Expected Discharge Date:  02/22/17               Expected Discharge Plan:  [place]  In-House Referral:  NA  Discharge planning Services  CM Consult  Post Acute Care Choice:  Durable Medical Equipment, Home Health Choice offered to:  Patient  DME Arranged:  Tub bench, Walker rolling with seat DME Agency:  [place]:  PT [facility] Agency:  [place]  Status of Service:  In process, will continue to follow  If discussed at Long Length of Stay Meetings, dates discussed:    Additional Comments:  Adron Bene, RN 02/19/2017, 4:01 PM

## 2017-02-19 NOTE — Evaluation (Addendum)
Physical Therapy One time Evaluation Patient Details Name: Christine Sims MRN: [id number] DOB: 30-Sep-1955 Today's Date: 02/19/2017   History of Present Illness  This 61 year old obese woman with chronic hypoxic respiratory failure on 2 L nasal cannula O2, reportedly COPD, congestive heart failure unknown diastolic or systolic status, insulin-dependent diabetes, CAD with PCI over decade ago, history of CVA with some speech difficulty and residual right-sided weakness, who presents with shortness of breath.  Clinical Impression  Pt admitted with above diagnosis. Pt currently without significant functional limitations and is at baseline status per pt.  Did assist pt in making recommendations for equipment and f/u.  Does not need further inpatient skilled PT in hospital.  Will not follow pt.       Follow Up Recommendations Home health PT(HHOT to assess toilet equipment and HHPT for home safety)    Equipment Recommendations  (rollator, 3N1 (capacity300 lb), tub bench)    Recommendations for Other Services       Precautions / Restrictions Precautions Precautions: Fall Restrictions Weight Bearing Restrictions: No      Mobility  Bed Mobility Overal bed mobility: Independent                Transfers Overall transfer level: Needs assistance Equipment used: 4-wheeled walker Transfers: Sit to/from Stand Sit to Stand: Supervision            Ambulation/Gait Ambulation/Gait assistance: Supervision Ambulation Distance (Feet): 40 Feet            Stairs            Wheelchair Mobility    Modified Rankin (Stroke Patients Only)       Balance Overall balance assessment: No apparent balance deficits (not formally assessed)                                           Pertinent Vitals/Pain Pain Assessment: No/denies pain    Home Living Family/patient expects to be discharged to:: Private residence Living Arrangements: Children(Lives with  daughter) Available Help at Discharge: Family Type of Home: House Home Access: Stairs to enter Entrance Stairs-Rails: Right Entrance Stairs-Number of Steps: 3 Home Layout: One level Home Equipment: Hospital bed;Cane - single point;Walker - 4 wheels(2LO2 home, rollator old per pt)      Prior Function Level of Independence: Independent               Hand Dominance        Extremity/Trunk Assessment   Upper Extremity Assessment Upper Extremity Assessment: Defer to OT evaluation    Lower Extremity Assessment Lower Extremity Assessment: Generalized weakness    Cervical / Trunk Assessment Cervical / Trunk Assessment: Normal  Communication   Communication: No difficulties  Cognition Arousal/Alertness: Awake/alert Behavior During Therapy: WFL for tasks assessed/performed Overall Cognitive Status: Within Functional Limits for tasks assessed                                        General Comments      Exercises     Assessment/Plan    PT Assessment Patent does not need any further PT services  PT Problem List         PT Treatment Interventions      PT Goals (Current goals can be found in the Care Plan section)  Acute Rehab PT Goals Patient Stated Goal: to go home PT Goal Formulation: All assessment and education complete, DC therapy    Frequency     Barriers to discharge        Co-evaluation               AM-PAC PT "6 Clicks" Daily Activity  Outcome Measure Difficulty turning over in bed (including adjusting bedclothes, sheets and blankets)?: None Difficulty moving from lying on back to sitting on the side of the bed? : None Difficulty sitting down on and standing up from a chair with arms (e.g., wheelchair, bedside commode, etc,.)?: None Help needed moving to and from a bed to chair (including a wheelchair)?: None Help needed walking in hospital room?: A Little Help needed climbing 3-5 steps with a railing? : A Little 6 Click  Score: 22    End of Session Equipment Utilized During Treatment: Gait belt;Oxygen Activity Tolerance: Patient limited by fatigue Patient left: in chair;with call bell/phone within reach Nurse Communication: Mobility status PT Visit Diagnosis: Muscle weakness (generalized) (M62.81)    Time: 4650-3546 PT Time Calculation (min) (ACUTE ONLY): 10 min   Charges:   PT Evaluation $PT Eval Low Complexity: 1 Low     PT G Codes:        Lars Jeziorski,PT Acute Rehabilitation [phone] (pager)   Denice Paradise 02/19/2017, 4:27 PM

## 2017-02-20 LAB — GLUCOSE, CAPILLARY
GLUCOSE-CAPILLARY: 444 mg/dL — AB (ref 65–99)
GLUCOSE-CAPILLARY: 354 mg/dL — AB (ref 65–99)
GLUCOSE-CAPILLARY: 469 mg/dL — AB (ref 65–99)
Glucose-Capillary: 426 mg/dL — ABNORMAL HIGH (ref 65–99)

## 2017-02-20 LAB — HEMOGLOBIN A1C
Hgb A1c MFr Bld: 12.6 % — ABNORMAL HIGH (ref 4.8–5.6)
Mean Plasma Glucose: 315 mg/dL

## 2017-02-20 MED ORDER — TRAZODONE HCL 100 MG PO TABS: 300.0000 mg | ORAL_TABLET | Freq: Every day | ORAL | Status: DC

## 2017-02-20 MED ORDER — BUSPIRONE HCL 15 MG PO TABS
30.0000 mg | ORAL_TABLET | Freq: Two times a day (BID) | ORAL | Status: DC
  Administered 2017-02-20 – 2017-02-26 (×12): 30 mg via ORAL
  Filled 2017-02-20 (×12): qty 2

## 2017-02-20 MED ORDER — IPRATROPIUM-ALBUTEROL 0.5-2.5 (3) MG/3ML IN SOLN
3.0000 mL | RESPIRATORY_TRACT | Status: DC
  Administered 2017-02-20: 3 mL via RESPIRATORY_TRACT
  Filled 2017-02-20 (×2): qty 3

## 2017-02-20 MED ORDER — FUROSEMIDE 20 MG PO TABS
10.0000 mg | ORAL_TABLET | Freq: Every day | ORAL | Status: DC
  Administered 2017-02-20 – 2017-02-26 (×7): 10 mg via ORAL
  Filled 2017-02-20 (×7): qty 1

## 2017-02-20 MED ORDER — INSULIN GLARGINE 100 UNIT/ML ~~LOC~~ SOLN
60.0000 [IU] | Freq: Two times a day (BID) | SUBCUTANEOUS | Status: DC
  Administered 2017-02-20 (×2): 60 [IU] via SUBCUTANEOUS
  Filled 2017-02-20 (×4): qty 0.6

## 2017-02-20 MED ORDER — INSULIN ASPART 100 UNIT/ML ~~LOC~~ SOLN
0.0000 [IU] | Freq: Three times a day (TID) | SUBCUTANEOUS | Status: DC
  Administered 2017-02-20 (×2): 15 [IU] via SUBCUTANEOUS
  Administered 2017-02-21: 11 [IU] via SUBCUTANEOUS
  Administered 2017-02-21: 15 [IU] via SUBCUTANEOUS
  Administered 2017-02-21: 11 [IU] via SUBCUTANEOUS

## 2017-02-20 MED ORDER — TRAMADOL HCL 50 MG PO TABS
50.0000 mg | ORAL_TABLET | Freq: Four times a day (QID) | ORAL | Status: DC | PRN
  Administered 2017-02-20 – 2017-02-23 (×6): 50 mg via ORAL
  Filled 2017-02-20 (×7): qty 1

## 2017-02-20 MED ORDER — MOMETASONE FURO-FORMOTEROL FUM 200-5 MCG/ACT IN AERO
2.0000 | INHALATION_SPRAY | Freq: Two times a day (BID) | RESPIRATORY_TRACT | Status: DC
  Administered 2017-02-20 – 2017-02-25 (×8): 2 via RESPIRATORY_TRACT
  Filled 2017-02-20: qty 8.8

## 2017-02-20 MED ORDER — INSULIN ASPART 100 UNIT/ML ~~LOC~~ SOLN
10.0000 [IU] | Freq: Once | SUBCUTANEOUS | Status: AC
  Administered 2017-02-20: 5 [IU] via SUBCUTANEOUS

## 2017-02-20 MED ORDER — FAMOTIDINE 20 MG PO TABS
20.0000 mg | ORAL_TABLET | Freq: Two times a day (BID) | ORAL | Status: DC
  Administered 2017-02-20 – 2017-02-26 (×13): 20 mg via ORAL
  Filled 2017-02-20 (×13): qty 1

## 2017-02-20 MED ORDER — INSULIN ASPART 100 UNIT/ML ~~LOC~~ SOLN
0.0000 [IU] | Freq: Every day | SUBCUTANEOUS | Status: DC
  Administered 2017-02-20: 5 [IU] via SUBCUTANEOUS
  Administered 2017-02-21: 3 [IU] via SUBCUTANEOUS
  Administered 2017-02-22: 5 [IU] via SUBCUTANEOUS
  Administered 2017-02-23: 4 [IU] via SUBCUTANEOUS
  Administered 2017-02-24: 3 [IU] via SUBCUTANEOUS
  Administered 2017-02-25: 4 [IU] via SUBCUTANEOUS

## 2017-02-20 MED ORDER — METHYLPREDNISOLONE SODIUM SUCC 125 MG IJ SOLR
60.0000 mg | Freq: Two times a day (BID) | INTRAMUSCULAR | Status: DC
  Administered 2017-02-20: 60 mg via INTRAVENOUS
  Filled 2017-02-20: qty 2

## 2017-02-20 MED ORDER — INSULIN ASPART 100 UNIT/ML ~~LOC~~ SOLN
15.0000 [IU] | Freq: Three times a day (TID) | SUBCUTANEOUS | Status: DC
  Administered 2017-02-20 – 2017-02-22 (×6): 15 [IU] via SUBCUTANEOUS

## 2017-02-20 NOTE — Progress Notes (Signed)
CRITICAL VALUE ALERT  Critical Value:  CBG 444  Date & Time Notied:  8299 02/20/17  Provider Notified: Dr. Maylene Roes  Orders Received/Actions taken: Orders given to give patient 16 units of insulin now and 15 units of insulin for meal coverage. Orders followed. Will continue to monitor.

## 2017-02-20 NOTE — Progress Notes (Signed)
CRITICAL VALUE ALERT  Critical Value:  CBG 426  Date & Time Notied:  1792 02/20/17  Provider Notified: Dr. Maylene Roes  Orders Received/Actions taken: Orders given to give patient 20 units now and 6 units meal coverage during breakfast. Orders followed. Will continue to monitor.

## 2017-02-20 NOTE — Progress Notes (Signed)
PROGRESS NOTE    JORY WELKE  BEE:[id number] DOB: September 26, 1955 DOA: 02/18/2017 PCP: Kristopher Glee., MD     Brief Narrative:  Christine Sims is a 61 year old obese female with history of COPD, chronic respiratory failure with hypoxia on 2 L of oxygen at home, chronic diastolic and systolic congestive heart failure, insulin-dependent diabetes, coronary artery disease status post stent, history of stroke, presented with shortness of breath. Patient reports she was in her usual state of health until 4-5 days ago when she started have worsening dyspnea, most notably productive cough of purulent sputum. She reports she takes Symbicort on a daily basis for her COPD. Other symptoms include upper airway congestion, sore throat, chest tightness. She reports that this felt like the same thing about a year ago when she was hospitalized for pneumonia per her report. Chest x-ray did not reveal acute infiltrate, did show cardiomegaly, BNP of 23. She was admitted for further treatment for COPD exacerbation.   Assessment & Plan:   Active Problems:   COPD (chronic obstructive pulmonary disease) (HCC)  COPD exacerbation with rhinovirus -Influenza negative  -Continue Solu-Medrol, bronchodilators, doxycycline  -Supportive care  Chronic hypoxic respiratory failure -Wears 2 L nasal cannula O2 at baseline  Chronic systolic and diastolic congestive heart failure -Stable without exacerbation, BNP 23.6  -Continue home lasix dose   Essential hypertension -Continue ARB-HCTZ, amlodipine, toprol   Coronary artery disease, history of CVA -Continue Plavix, lipitor   CKD stage 3 -Stable   Insulin-dependent diabetes, uncontrolled with hyperglycemia -Hemoglobin A1c 12.6 -Appreciate diabetic coordinator -Insulin dosing increased today 60 units twice daily, 15 units with meals along with moderate sliding scale insulin  Depression/anxiety -Continue buspar   DVT prophylaxis: lovenox Code Status:  Full Family Communication: No family at bedside Disposition Plan: Pending improvement   Consultants:   None  Procedures:   None   Antimicrobials:  Anti-infectives (From admission, onward)   Start     Dose/Rate Route Frequency Ordered Stop   02/19/17 1000  doxycycline (VIBRA-TABS) tablet 100 mg     100 mg Oral Every 12 hours 02/19/17 0051     02/18/17 2100  azithromycin (ZITHROMAX) tablet 500 mg     500 mg Oral  Once 02/18/17 2059 02/18/17 2130       Subjective: Patient continues to be very short of breath at rest with wheezing  Objective: Vitals:   02/19/17 2017 02/20/17 0521 02/20/17 0938 02/20/17 1000  BP:  (!) 152/87  137/73  Pulse:  92  90  Resp:  19  20  Temp:  98.2 F (36.8 C)  98.1 F (36.7 C)  TempSrc:  Oral  Oral  SpO2: 100% 95% 95% 96%  Weight:      Height:        Intake/Output Summary (Last 24 hours) at 02/20/2017 1446 Last data filed at 02/20/2017 0951 Gross per 24 hour  Intake 1443 ml  Output 0 ml  Net 1443 ml   Filed Weights   02/18/17 1737 02/19/17 0100  Weight: 108 kg (238 lb) 107.2 kg (236 lb 5.3 oz)    Examination:  General exam: Appears calm and comfortable  Respiratory system: Diffuse wheezes bilaterally, no respiratory distress, remains on nasal cannula O2 Cardiovascular system: S1 & S2 heard, RRR. No JVD, murmurs, rubs, gallops or clicks. No pedal edema. Gastrointestinal system: Abdomen is nondistended, soft and nontender. No organomegaly or masses felt. Normal bowel sounds heard. Central nervous system: Alert and oriented. No focal neurological deficits. Extremities: Symmetric 5  x 5 power. Skin: No rashes, lesions or ulcers Psychiatry: Judgement and insight appear normal. Mood & affect appropriate.   Data Reviewed: I have personally reviewed following labs and imaging studies  CBC: Recent Labs  Lab 02/18/17 1935 02/19/17 0551  WBC 7.1 7.0  NEUTROABS 4.3  --   HGB 10.5* 9.7*  HCT 33.4* 31.3*  MCV 82.1 81.7  PLT 267 884    Basic Metabolic Panel: Recent Labs  Lab 02/18/17 1935 02/19/17 0551  NA 135 136  K 3.7 4.3  CL 100* 103  CO2 25 25  GLUCOSE 265* 369*  BUN 14 15  CREATININE 1.27* 1.26*  CALCIUM 9.1 8.9   GFR: Estimated Creatinine Clearance: 56.7 mL/min (A) (by C-G formula based on SCr of 1.26 mg/dL (H)). Liver Function Tests: No results for input(s): AST, ALT, ALKPHOS, BILITOT, PROT, ALBUMIN in the last 168 hours. No results for input(s): LIPASE, AMYLASE in the last 168 hours. No results for input(s): AMMONIA in the last 168 hours. Coagulation Profile: No results for input(s): INR, PROTIME in the last 168 hours. Cardiac Enzymes: Recent Labs  Lab 02/18/17 1935  TROPONINI <0.03   BNP (last 3 results) No results for input(s): PROBNP in the last 8760 hours. HbA1C: Recent Labs    02/19/17 0551  HGBA1C 12.6*   CBG: Recent Labs  Lab 02/19/17 1219 02/19/17 1707 02/19/17 2110 02/20/17 0733 02/20/17 1130  GLUCAP 371* 330* 405* 426* 444*   Lipid Profile: No results for input(s): CHOL, HDL, LDLCALC, TRIG, CHOLHDL, LDLDIRECT in the last 72 hours. Thyroid Function Tests: No results for input(s): TSH, T4TOTAL, FREET4, T3FREE, THYROIDAB in the last 72 hours. Anemia Panel: No results for input(s): VITAMINB12, FOLATE, FERRITIN, TIBC, IRON, RETICCTPCT in the last 72 hours. Sepsis Labs: No results for input(s): PROCALCITON, LATICACIDVEN in the last 168 hours.  Recent Results (from the past 240 hour(s))  Respiratory Panel by PCR     Status: Abnormal   Collection Time: 02/18/17  9:25 PM  Result Value Ref Range Status   Adenovirus NOT DETECTED NOT DETECTED Final   Coronavirus 229E NOT DETECTED NOT DETECTED Final   Coronavirus HKU1 NOT DETECTED NOT DETECTED Final   Coronavirus NL63 NOT DETECTED NOT DETECTED Final   Coronavirus OC43 NOT DETECTED NOT DETECTED Final   Metapneumovirus NOT DETECTED NOT DETECTED Final   Rhinovirus / Enterovirus DETECTED (A) NOT DETECTED Final   Influenza A  NOT DETECTED NOT DETECTED Final   Influenza B NOT DETECTED NOT DETECTED Final   Parainfluenza Virus 1 NOT DETECTED NOT DETECTED Final   Parainfluenza Virus 2 NOT DETECTED NOT DETECTED Final   Parainfluenza Virus 3 NOT DETECTED NOT DETECTED Final   Parainfluenza Virus 4 NOT DETECTED NOT DETECTED Final   Respiratory Syncytial Virus NOT DETECTED NOT DETECTED Final   Bordetella pertussis NOT DETECTED NOT DETECTED Final   Chlamydophila pneumoniae NOT DETECTED NOT DETECTED Final   Mycoplasma pneumoniae NOT DETECTED NOT DETECTED Final       Radiology Studies: Dg Chest 2 View  Result Date: 02/18/2017 CLINICAL DATA:  Shortness of breath and pneumonia. EXAM: CHEST  2 VIEW COMPARISON:  February 01, 2017 FINDINGS: Cardiomegaly. The hila and mediastinum are normal. No nodules or masses. No focal infiltrates. Possible tiny effusions based on the lateral view. IMPRESSION: Cardiomegaly. Probable tiny pleural effusions. No other acute abnormalities. Electronically Signed   By: Dorise Bullion III M.D   On: 02/18/2017 18:34      Scheduled Meds: . amLODipine  10 mg Oral Daily  .  atorvastatin  80 mg Oral Daily  . busPIRone  30 mg Oral BID  . clopidogrel  75 mg Oral Daily  . dextromethorphan-guaiFENesin  1 tablet Oral BID  . doxycycline  100 mg Oral Q12H  . enoxaparin (LOVENOX) injection  40 mg Subcutaneous Q24H  . famotidine  20 mg Oral BID  . furosemide  10 mg Oral Daily  . gabapentin  300 mg Oral QHS  . losartan  100 mg Oral Daily   And  . hydrochlorothiazide  25 mg Oral Daily  . insulin aspart  0-15 Units Subcutaneous TID WC  . insulin aspart  0-5 Units Subcutaneous QHS  . insulin aspart  15 Units Subcutaneous TID WC  . insulin glargine  60 Units Subcutaneous BID  . ipratropium-albuterol  3 mL Nebulization Q4H  . loratadine  10 mg Oral Daily  . [START ON 02/21/2017] methylPREDNISolone (SOLU-MEDROL) injection  60 mg Intravenous Q12H  . metoprolol succinate  25 mg Oral Daily  .  mometasone-formoterol  2 puff Inhalation BID  . pantoprazole  40 mg Oral Daily  . sodium chloride flush  3 mL Intravenous Q12H   Continuous Infusions:   LOS: 2 days    Time spent: 40 minutes   Dessa Phi, DO [facility] [email or web address] Password [email or web address] 02/20/2017, 2:46 PM

## 2017-02-20 NOTE — Evaluation (Signed)
Occupational Therapy Evaluation Patient Details Name: Christine Sims MRN: [id number] DOB: 28-Nov-1955 Today's Date: 02/20/2017    History of Present Illness This 61 year old obese woman with chronic hypoxic respiratory failure on 2 L nasal cannula O2, reportedly COPD, congestive heart failure unknown diastolic or systolic status, insulin-dependent diabetes, CAD with PCI over decade ago, history of CVA with some speech difficulty and residual right-sided weakness, who presents with shortness of breath.   Clinical Impression   .Patient evaluated by Occupational Therapy with no further acute OT needs identified. All education has been completed and the patient has no further questions. Pt is close to her baseline, per her report.  She has been performing ADLs mod I this am prior to OT arrival.  She is able to verbalize understanding of energy conservation techniques, use of tub transfer bench.  She was provided with theraband and reports she has an HEP - encouraged her to perform HEP daily.   See below for any follow-up Occupational Therapy or equipment needs. OT is signing off. Thank you for this referral.      Follow Up Recommendations  No OT follow up;Supervision/Assistance - 24 hour    Equipment Recommendations  Tub/shower bench    Recommendations for Other Services       Precautions / Restrictions Precautions Precautions: Fall      Mobility Bed Mobility Overal bed mobility: Independent                Transfers Overall transfer level: Modified independent                    Balance Overall balance assessment: No apparent balance deficits (not formally assessed)                                         ADL either performed or assessed with clinical judgement   ADL Overall ADL's : Modified independent                                       General ADL Comments: Pt reports she performed sponge bath mod I this am.   She feels  she is close to her baseline.  She indicates today is not a good day, and when she has these, she just rests and paces herself throughout the day.   She was able to verbalize how to safely use tub transfer bench      Vision         Perception     Praxis      Pertinent Vitals/Pain Pain Assessment: No/denies pain     Hand Dominance     Extremity/Trunk Assessment Upper Extremity Assessment Upper Extremity Assessment: Generalized weakness   Lower Extremity Assessment Lower Extremity Assessment: Defer to PT evaluation   Cervical / Trunk Assessment Cervical / Trunk Assessment: Normal   Communication Communication Communication: No difficulties   Cognition Arousal/Alertness: Awake/alert Behavior During Therapy: WFL for tasks assessed/performed Overall Cognitive Status: Within Functional Limits for tasks assessed                                     General Comments       Exercises Exercises: Other exercises Other Exercises Other Exercises: Pt provided with  Level 2 theraband.  She was instructed in horizontal Abduction bil. UEs.  She performed 5 reps and stopped citing fatigue.  She states she knows "what to do with it" as she reports she has had theraband in the past and has a HEP    Shoulder Instructions      Home Living Family/patient expects to be discharged to:: Private residence Living Arrangements: Children(Lives with daughter) Available Help at Discharge: Family Type of Home: House Home Access: Stairs to enter Technical brewer of Steps: 3 Entrance Stairs-Rails: Right Home Layout: One level     Bathroom Shower/Tub: Corporate investment banker: [facility] bed;Cane - single point;Walker - 4 wheels(2LO2 home, rollator old per pt)   Additional Comments: daughter works full time +.  Pt's fiance' is present much of the day an assists her as needed, per pt.       Prior Functioning/Environment Level  of Independence: Independent        Comments: Pt reports she attempts to assist with IADLs - cleaning and cooking when she is able.  She reports she wears 2L supplemental 02 all of the time at [facility]        OT Problem List: Decreased strength;Decreased activity tolerance;Cardiopulmonary status limiting activity      OT Treatment/Interventions:      OT Goals(Current goals can be found in the care plan section) Acute Rehab OT Goals Patient Stated Goal: to go home OT Goal Formulation: All assessment and education complete, DC therapy  OT Frequency:     Barriers to D/C:            Co-evaluation              AM-PAC PT "6 Clicks" Daily Activity     Outcome Measure Help from another person eating meals?: None Help from another person taking care of personal grooming?: None Help from another person toileting, which includes using toliet, bedpan, or urinal?: None Help from another person bathing (including washing, rinsing, drying)?: None Help from another person to put on and taking off regular upper body clothing?: None Help from another person to put on and taking off regular lower body clothing?: None 6 Click Score: 24   End of Session Equipment Utilized During Treatment: Oxygen  Activity Tolerance: Patient limited by fatigue Patient left: in bed;with call bell/phone within reach  OT Visit Diagnosis: Muscle weakness (generalized) (M62.81)                Time: 3546-5681 OT Time Calculation (min): 15 min Charges:  OT General Charges $OT Visit: 1 Visit OT Evaluation $OT Eval Low Complexity: 1 Low G-Codes:     [facility], OTR/L [phone]   Lucille Passy M 02/20/2017, 11:39 AM

## 2017-02-20 NOTE — Progress Notes (Signed)
Results for PHUNG, KOTAS (MRN [id number]) as of 02/20/2017 21:09  Ref. Range 02/20/2017 21:05  Glucose-Capillary Latest Ref Range: 65 - 99 mg/dL 469 (H)  NP paged. Awaiting return call.

## 2017-02-21 LAB — BASIC METABOLIC PANEL
Anion gap: 11 (ref 5–15)
BUN: 24 mg/dL — AB (ref 6–20)
CHLORIDE: 99 mmol/L — AB (ref 101–111)
CO2: 26 mmol/L (ref 22–32)
Calcium: 9 mg/dL (ref 8.9–10.3)
Creatinine, Ser: 1.24 mg/dL — ABNORMAL HIGH (ref 0.44–1.00)
GFR calc Af Amer: 54 mL/min — ABNORMAL LOW (ref >60)
GFR calc non Af Amer: 46 mL/min — ABNORMAL LOW (ref >60)
GLUCOSE: 297 mg/dL — AB (ref 65–99)
POTASSIUM: 3.9 mmol/L (ref 3.5–5.1)
Sodium: 136 mmol/L (ref 135–145)

## 2017-02-21 LAB — CBC
HCT: 32.6 % — ABNORMAL LOW (ref 36.0–46.0)
HEMOGLOBIN: 10.4 g/dL — AB (ref 12.0–15.0)
MCH: 25.8 pg — AB (ref 26.0–34.0)
MCHC: 31.9 g/dL (ref 30.0–36.0)
MCV: 80.9 fL (ref 78.0–100.0)
Platelets: 292 10*3/uL (ref 150–400)
RBC: 4.03 MIL/uL (ref 3.87–5.11)
RDW: 15.3 % (ref 11.5–15.5)
WBC: 10.8 10*3/uL — ABNORMAL HIGH (ref 4.0–10.5)

## 2017-02-21 LAB — GLUCOSE, CAPILLARY
GLUCOSE-CAPILLARY: 297 mg/dL — AB (ref 65–99)
Glucose-Capillary: 307 mg/dL — ABNORMAL HIGH (ref 65–99)
Glucose-Capillary: 318 mg/dL — ABNORMAL HIGH (ref 65–99)
Glucose-Capillary: 358 mg/dL — ABNORMAL HIGH (ref 65–99)

## 2017-02-21 MED ORDER — IPRATROPIUM-ALBUTEROL 20-100 MCG/ACT IN AERS
1.0000 | INHALATION_SPRAY | Freq: Two times a day (BID) | RESPIRATORY_TRACT | Status: DC
  Administered 2017-02-21 (×2): 1 via RESPIRATORY_TRACT
  Filled 2017-02-21: qty 4

## 2017-02-21 MED ORDER — IPRATROPIUM-ALBUTEROL 0.5-2.5 (3) MG/3ML IN SOLN: 3.0000 mL | Freq: Two times a day (BID) | RESPIRATORY_TRACT | Status: DC

## 2017-02-21 MED ORDER — PREDNISONE 50 MG PO TABS
50.0000 mg | ORAL_TABLET | Freq: Every day | ORAL | Status: DC
  Administered 2017-02-21 – 2017-02-26 (×6): 50 mg via ORAL
  Filled 2017-02-21 (×6): qty 1

## 2017-02-21 MED ORDER — INSULIN GLARGINE 100 UNIT/ML ~~LOC~~ SOLN
70.0000 [IU] | Freq: Two times a day (BID) | SUBCUTANEOUS | Status: DC
  Administered 2017-02-21 – 2017-02-22 (×3): 70 [IU] via SUBCUTANEOUS
  Filled 2017-02-21 (×3): qty 0.7

## 2017-02-21 MED ORDER — ALBUTEROL SULFATE (2.5 MG/3ML) 0.083% IN NEBU: 3.0000 mL | INHALATION_SOLUTION | RESPIRATORY_TRACT | Status: DC | PRN

## 2017-02-21 NOTE — Progress Notes (Signed)
Inpatient Diabetes Program Recommendations  AACE/ADA: New Consensus Statement on Inpatient Glycemic Control (2015)  Target Ranges:  Prepandial:   less than 140 mg/dL      Peak postprandial:   less than 180 mg/dL (1-2 hours)      Critically ill patients:  140 - 180 mg/dL  Results for Christine Sims, Christine Sims (MRN [id number]) as of 02/21/2017 15:26  Ref. Range 02/20/2017 07:33 02/20/2017 11:30 02/20/2017 16:24 02/20/2017 21:05 02/21/2017 07:44 02/21/2017 11:52  Glucose-Capillary Latest Ref Range: 65 - 99 mg/dL 426 (H) 444 (H) 354 (H) 469 (H) 307 (H) 318 (H)    Review of Glycemic Control  Diabetes history: DM2 Outpatient Diabetes medications: Lantus 60 units BID, Novolog 30 units TID with meals Current orders for Inpatient glycemic control: Lantus 70 units BID, Novolog 15 units TID with meals for meal coverage, Novolog 0-15 units TID with meals, Novolog 0-5 units QHS  Inpatient Diabetes Program Recommendations: Insulin - Basal: Noted Lantus was increased from 60 to 70 units BID and steroids changed to PO and decreased. HgbA1C: A1C 12.6% on 02/19/17 indicating an average glucose of 309 mg/dl over the past 2-3 months. Recommend patient be referred to an Endocrinoloigst for assistance with improving DM control. Outpatient DM medications: At time of discharge, MD may want to consider making adjustments to outpatient DM medications to improve DM control.  NOTE: Spoke with patient about diabetes and home regimen for diabetes control. Patient reports that she is followed by PCP for diabetes management and currently she takes Lantus 60 units BID, Novolog 30 units TID with meals as an outpatient for diabetes control. Patient reports that she is consistently taking insulin as prescribed and that she last seen her 01/23/17. Patient reports that no changes were made with her insulin at last office visit. Patient states that she has not been checking her glucose like she should but when she was checking it, it was  usually over 300 mg/dl. Patient states that she recently moved and she lost her glucometer and testing supplies in the move and she has been unable to check her glucose in about 2 weeks. Patient will need Rx for new glucometer and testing supplies at time of discharge.   Inquired about prior A1C and patient reports that she does not recall her last A1C value. Discussed A1C results (12.6% on 02/19/17) and explained that her current A1C indicates an average glucose of 309 mg/dl over the past 2-3 months. Discussed glucose and A1C goals. Discussed importance of checking CBGs and maintaining good CBG control to prevent long-term and short-term complications. Explained how hyperglycemia leads to damage within blood vessels which lead to the common complications seen with uncontrolled diabetes. Stressed to the patient the importance of improving glycemic control to prevent further complications from uncontrolled diabetes. Discussed impact of nutrition, exercise, stress, sickness, and medications on diabetes control. Patient reports that she has recently been on Prednisone and she has to take it intermittently with COPD exacerbation. Explained importance of communication with healthcare provider when glucose is trending consistently over 180 mg/dl to advice of medication adjustments especially when she is taking steroids.  Encouraged patient to check her glucose 3-4  times per day (before meals and at bedtime) and to keep a log book of glucose readings and insulin taken which she will need to take to doctor appointments. Explained how the doctor she follows up with can use the log book to continue to make insulin adjustments if needed. Inquired about an Endocrinologist and patient reports that she  does not have one. Encouraged patient to talk with PCP about being referred to an Endocrinologist for assistance with improve DM control.  Patient verbalized understanding of information discussed and she states that she has no  further questions at this time related to diabetes.  Thanks, Barnie Alderman, RN, MSN, CDE Diabetes Coordinator Inpatient Diabetes Program [phone] (Team Pager)

## 2017-02-21 NOTE — H&P (Signed)
PT has refused all breathing treatments  PT was assessed per protocol assessment

## 2017-02-21 NOTE — Progress Notes (Signed)
PROGRESS NOTE    Christine Sims  RXV:[id number] DOB: 01/14/1956 DOA: 02/18/2017 PCP: Kristopher Glee., MD     Brief Narrative:  Christine Sims is a 61 year old obese female with history of COPD, chronic respiratory failure with hypoxia on 2 L of oxygen at home, chronic diastolic and systolic congestive heart failure, insulin-dependent diabetes, coronary artery disease status post stent, history of stroke, presented with shortness of breath. Patient reports she was in her usual state of health until 4-5 days ago when she started have worsening dyspnea, most notably productive cough of purulent sputum. She reports she takes Symbicort on a daily basis for her COPD. Other symptoms include upper airway congestion, sore throat, chest tightness. She reports that this felt like the same thing about a year ago when she was hospitalized for pneumonia per her report. Chest x-ray did not reveal acute infiltrate, did show cardiomegaly, BNP of 23. She was admitted for further treatment for COPD exacerbation.   Assessment & Plan:   Active Problems:   COPD (chronic obstructive pulmonary disease) (HCC)  COPD exacerbation with rhinovirus -Influenza negative  -Continue prednisone, bronchodilators, doxycycline  -Supportive care  Chronic hypoxic respiratory failure -Wears 2 L nasal cannula O2 at baseline  Chronic systolic and diastolic congestive heart failure -Stable without exacerbation, BNP 23.6  -Continue home lasix dose   Essential hypertension -Continue ARB-HCTZ, amlodipine, toprol   Coronary artery disease, history of CVA -Continue Plavix, lipitor   CKD stage 3 -Stable   Insulin-dependent diabetes, uncontrolled with hyperglycemia -Hemoglobin A1c 12.6 -Appreciate diabetic coordinator -Insulin dosing increased today 70 units twice daily, 15 units with meals along with moderate sliding scale insulin  Depression/anxiety -Continue buspar   DVT prophylaxis: lovenox Code Status:  Full Family Communication: No family at bedside Disposition Plan: Pending improvement. HHPT on discharge.    Consultants:   None  Procedures:   None   Antimicrobials:  Anti-infectives (From admission, onward)   Start     Dose/Rate Route Frequency Ordered Stop   02/19/17 1000  doxycycline (VIBRA-TABS) tablet 100 mg     100 mg Oral Every 12 hours 02/19/17 0051     02/18/17 2100  azithromycin (ZITHROMAX) tablet 500 mg     500 mg Oral  Once 02/18/17 2059 02/18/17 2130       Subjective: Patient continues to be very short of breath at rest with wheezing, refusing nebulizer treatment as these give her a headache.  Asking for inhalers instead.  Objective: Vitals:   02/20/17 1000 02/20/17 1724 02/20/17 2047 02/21/17 0455  BP: 137/73 129/81  (!) 154/85  Pulse: 90 91 92 85  Resp: 20 20 20 18   Temp: 98.1 F (36.7 C) 98 F (36.7 C)  98.3 F (36.8 C)  TempSrc: Oral Oral  Oral  SpO2: 96% 98% 99% 99%  Weight:   107.9 kg (237 lb 14 oz)   Height:        Intake/Output Summary (Last 24 hours) at 02/21/2017 1258 Last data filed at 02/21/2017 0600 Gross per 24 hour  Intake 420 ml  Output 1150 ml  Net -730 ml   Filed Weights   02/18/17 1737 02/19/17 0100 02/20/17 2047  Weight: 108 kg (238 lb) 107.2 kg (236 lb 5.3 oz) 107.9 kg (237 lb 14 oz)    Examination:  General exam: Appears calm and comfortable  Respiratory system: Diffuse wheezes bilaterally, no respiratory distress, remains on nasal cannula O2 Cardiovascular system: S1 & S2 heard, RRR. No JVD, murmurs, rubs, gallops or  clicks. No pedal edema. Gastrointestinal system: Abdomen is nondistended, soft and nontender. No organomegaly or masses felt. Normal bowel sounds heard. Central nervous system: Alert and oriented. No focal neurological deficits. Extremities: Symmetric 5 x 5 power. Skin: No rashes, lesions or ulcers Psychiatry: Judgement and insight appear normal. Mood & affect appropriate.   Data Reviewed: I have  personally reviewed following labs and imaging studies  CBC: Recent Labs  Lab 02/18/17 1935 02/19/17 0551 02/21/17 0509  WBC 7.1 7.0 10.8*  NEUTROABS 4.3  --   --   HGB 10.5* 9.7* 10.4*  HCT 33.4* 31.3* 32.6*  MCV 82.1 81.7 80.9  PLT 267 260 062   Basic Metabolic Panel: Recent Labs  Lab 02/18/17 1935 02/19/17 0551 02/21/17 0509  NA 135 136 136  K 3.7 4.3 3.9  CL 100* 103 99*  CO2 25 25 26   GLUCOSE 265* 369* 297*  BUN 14 15 24*  CREATININE 1.27* 1.26* 1.24*  CALCIUM 9.1 8.9 9.0   GFR: Estimated Creatinine Clearance: 57.9 mL/min (A) (by C-G formula based on SCr of 1.24 mg/dL (H)). Liver Function Tests: No results for input(s): AST, ALT, ALKPHOS, BILITOT, PROT, ALBUMIN in the last 168 hours. No results for input(s): LIPASE, AMYLASE in the last 168 hours. No results for input(s): AMMONIA in the last 168 hours. Coagulation Profile: No results for input(s): INR, PROTIME in the last 168 hours. Cardiac Enzymes: Recent Labs  Lab 02/18/17 1935  TROPONINI <0.03   BNP (last 3 results) No results for input(s): PROBNP in the last 8760 hours. HbA1C: Recent Labs    02/19/17 0551  HGBA1C 12.6*   CBG: Recent Labs  Lab 02/20/17 1130 02/20/17 1624 02/20/17 2105 02/21/17 0744 02/21/17 1152  GLUCAP 444* 354* 469* 307* 318*   Lipid Profile: No results for input(s): CHOL, HDL, LDLCALC, TRIG, CHOLHDL, LDLDIRECT in the last 72 hours. Thyroid Function Tests: No results for input(s): TSH, T4TOTAL, FREET4, T3FREE, THYROIDAB in the last 72 hours. Anemia Panel: No results for input(s): VITAMINB12, FOLATE, FERRITIN, TIBC, IRON, RETICCTPCT in the last 72 hours. Sepsis Labs: No results for input(s): PROCALCITON, LATICACIDVEN in the last 168 hours.  Recent Results (from the past 240 hour(s))  Respiratory Panel by PCR     Status: Abnormal   Collection Time: 02/18/17  9:25 PM  Result Value Ref Range Status   Adenovirus NOT DETECTED NOT DETECTED Final   Coronavirus 229E NOT  DETECTED NOT DETECTED Final   Coronavirus HKU1 NOT DETECTED NOT DETECTED Final   Coronavirus NL63 NOT DETECTED NOT DETECTED Final   Coronavirus OC43 NOT DETECTED NOT DETECTED Final   Metapneumovirus NOT DETECTED NOT DETECTED Final   Rhinovirus / Enterovirus DETECTED (A) NOT DETECTED Final   Influenza A NOT DETECTED NOT DETECTED Final   Influenza B NOT DETECTED NOT DETECTED Final   Parainfluenza Virus 1 NOT DETECTED NOT DETECTED Final   Parainfluenza Virus 2 NOT DETECTED NOT DETECTED Final   Parainfluenza Virus 3 NOT DETECTED NOT DETECTED Final   Parainfluenza Virus 4 NOT DETECTED NOT DETECTED Final   Respiratory Syncytial Virus NOT DETECTED NOT DETECTED Final   Bordetella pertussis NOT DETECTED NOT DETECTED Final   Chlamydophila pneumoniae NOT DETECTED NOT DETECTED Final   Mycoplasma pneumoniae NOT DETECTED NOT DETECTED Final       Radiology Studies: No results found.    Scheduled Meds: . amLODipine  10 mg Oral Daily  . atorvastatin  80 mg Oral Daily  . busPIRone  30 mg Oral BID  . clopidogrel  75 mg Oral Daily  . dextromethorphan-guaiFENesin  1 tablet Oral BID  . doxycycline  100 mg Oral Q12H  . enoxaparin (LOVENOX) injection  40 mg Subcutaneous Q24H  . famotidine  20 mg Oral BID  . furosemide  10 mg Oral Daily  . gabapentin  300 mg Oral QHS  . losartan  100 mg Oral Daily   And  . hydrochlorothiazide  25 mg Oral Daily  . insulin aspart  0-15 Units Subcutaneous TID WC  . insulin aspart  0-5 Units Subcutaneous QHS  . insulin aspart  15 Units Subcutaneous TID WC  . insulin glargine  70 Units Subcutaneous BID  . Ipratropium-Albuterol  1 puff Inhalation BID  . loratadine  10 mg Oral Daily  . metoprolol succinate  25 mg Oral Daily  . mometasone-formoterol  2 puff Inhalation BID  . pantoprazole  40 mg Oral Daily  . predniSONE  50 mg Oral Q breakfast  . sodium chloride flush  3 mL Intravenous Q12H   Continuous Infusions:   LOS: 3 days    Time spent: 20 minutes    Dessa Phi, DO [facility] [email or web address] Password [email or web address] 02/21/2017, 12:58 PM

## 2017-02-22 LAB — BASIC METABOLIC PANEL
Anion gap: 10 (ref 5–15)
BUN: 26 mg/dL — AB (ref 6–20)
CALCIUM: 8.2 mg/dL — AB (ref 8.9–10.3)
CO2: 23 mmol/L (ref 22–32)
Chloride: 99 mmol/L — ABNORMAL LOW (ref 101–111)
Creatinine, Ser: 1.47 mg/dL — ABNORMAL HIGH (ref 0.44–1.00)
GFR calc Af Amer: 44 mL/min — ABNORMAL LOW (ref >60)
GFR, EST NON AFRICAN AMERICAN: 38 mL/min — AB (ref >60)
GLUCOSE: 427 mg/dL — AB (ref 65–99)
POTASSIUM: 3.5 mmol/L (ref 3.5–5.1)
Sodium: 132 mmol/L — ABNORMAL LOW (ref 135–145)

## 2017-02-22 LAB — CBC
HCT: 31.8 % — ABNORMAL LOW (ref 36.0–46.0)
Hemoglobin: 9.9 g/dL — ABNORMAL LOW (ref 12.0–15.0)
MCH: 25.7 pg — AB (ref 26.0–34.0)
MCHC: 31.1 g/dL (ref 30.0–36.0)
MCV: 82.6 fL (ref 78.0–100.0)
PLATELETS: 264 10*3/uL (ref 150–400)
RBC: 3.85 MIL/uL — ABNORMAL LOW (ref 3.87–5.11)
RDW: 16 % — AB (ref 11.5–15.5)
WBC: 7.6 10*3/uL (ref 4.0–10.5)

## 2017-02-22 LAB — GLUCOSE, CAPILLARY
GLUCOSE-CAPILLARY: 167 mg/dL — AB (ref 65–99)
GLUCOSE-CAPILLARY: 403 mg/dL — AB (ref 65–99)
Glucose-Capillary: 369 mg/dL — ABNORMAL HIGH (ref 65–99)
Glucose-Capillary: 354 mg/dL — ABNORMAL HIGH (ref 65–99)

## 2017-02-22 MED ORDER — INSULIN GLARGINE 100 UNIT/ML ~~LOC~~ SOLN
75.0000 [IU] | Freq: Two times a day (BID) | SUBCUTANEOUS | Status: DC
  Administered 2017-02-22 – 2017-02-25 (×6): 75 [IU] via SUBCUTANEOUS
  Filled 2017-02-22 (×7): qty 0.75

## 2017-02-22 MED ORDER — IPRATROPIUM-ALBUTEROL 20-100 MCG/ACT IN AERS
1.0000 | INHALATION_SPRAY | Freq: Two times a day (BID) | RESPIRATORY_TRACT | Status: DC
  Administered 2017-02-22 – 2017-02-25 (×6): 1 via RESPIRATORY_TRACT

## 2017-02-22 MED ORDER — INSULIN ASPART 100 UNIT/ML ~~LOC~~ SOLN
25.0000 [IU] | Freq: Three times a day (TID) | SUBCUTANEOUS | Status: DC
  Administered 2017-02-22 – 2017-02-23 (×4): 25 [IU] via SUBCUTANEOUS

## 2017-02-22 MED ORDER — INSULIN ASPART 100 UNIT/ML ~~LOC~~ SOLN
0.0000 [IU] | Freq: Three times a day (TID) | SUBCUTANEOUS | Status: DC
  Administered 2017-02-22: 4 [IU] via SUBCUTANEOUS
  Administered 2017-02-22 (×2): 20 [IU] via SUBCUTANEOUS
  Administered 2017-02-23: 4 [IU] via SUBCUTANEOUS
  Administered 2017-02-23: 20 [IU] via SUBCUTANEOUS
  Administered 2017-02-23 – 2017-02-24 (×2): 15 [IU] via SUBCUTANEOUS
  Administered 2017-02-24: 3 [IU] via SUBCUTANEOUS
  Administered 2017-02-25: 4 [IU] via SUBCUTANEOUS
  Administered 2017-02-25: 3 [IU] via SUBCUTANEOUS
  Administered 2017-02-26: 7 [IU] via SUBCUTANEOUS
  Administered 2017-02-26: 11 [IU] via SUBCUTANEOUS
  Administered 2017-02-26: 4 [IU] via SUBCUTANEOUS

## 2017-02-22 MED ORDER — ENOXAPARIN SODIUM 60 MG/0.6ML ~~LOC~~ SOLN
50.0000 mg | SUBCUTANEOUS | Status: DC
  Administered 2017-02-23 – 2017-02-26 (×4): 50 mg via SUBCUTANEOUS
  Filled 2017-02-22 (×4): qty 0.6

## 2017-02-22 NOTE — Progress Notes (Signed)
Inpatient Diabetes Program Recommendations  AACE/ADA: New Consensus Statement on Inpatient Glycemic Control (2015)  Target Ranges:  Prepandial:   less than 140 mg/dL      Peak postprandial:   less than 180 mg/dL (1-2 hours)      Critically ill patients:  140 - 180 mg/dL  Results for Christine Sims, Christine Sims (MRN [id number]) as of 02/22/2017 08:56  Ref. Range 02/21/2017 07:44 02/21/2017 11:52 02/21/2017 17:05 02/21/2017 21:18 02/22/2017 07:29  Glucose-Capillary Latest Ref Range: 65 - 99 mg/dL 307 (H) 318 (H) 358 (H) 297 (H) 369 (H)  Results for KIELY, COUSAR (MRN [id number]) as of 02/22/2017 08:56  Ref. Range 02/19/2017 05:51  Hemoglobin A1C Latest Ref Range: 4.8 - 5.6 % 12.6 (H)    Review of Glycemic Control Diabetes history: DM2 Outpatient Diabetes medications: Lantus 60 units BID, Novolog 30 units TID with meals Current orders for Inpatient glycemic control: Lantus 70 units BID, Novolog 15 units TID with meals for meal coverage, Novolog 0-20 units TID with meals, Novolog 0-5 units QHS  Inpatient Diabetes Program Recommendations: Insulin - Basal: Please consider increasing Lantus to 75 units BID. Insulin-Meal Coverage: Please consider increasing meal coverage to Novolog 25 units TID with meals. HgbA1C: A1C 12.6% on 02/19/17 indicating an average glucose of 309 mg/dl over the past 2-3 months. Recommend patient be referred to an Endocrinoloigst for assistance with improving DM control. Outpatient DM medications: At time of discharge, MD may want to consider making adjustments to outpatient DM medications to improve DM control.  Thanks, Barnie Alderman, RN, MSN, CDE Diabetes Coordinator Inpatient Diabetes Program [phone] (Team Pager from 8am to 5pm)

## 2017-02-22 NOTE — Progress Notes (Signed)
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 Patient reports she was in her usual state of health until 4-5 days ago when she started have worsening dyspnea, most notably productive cough of purulent sputum. She reports she takes Symbicort on a daily basis for her COPD. Other symptoms include upper airway congestion, sore throat, chest tightness. She reports that this felt like the same thing about a year ago when she was hospitalized for pneumonia per her report. Chest x-ray did not reveal acute infiltrate, did show cardiomegaly, BNP of 23. She was admitted for further treatment for COPD exacerbation.   Assessment & Plan:   Active Problems:   COPD (chronic obstructive pulmonary disease) (HCC)  COPD exacerbation with rhinovirus -Influenza negative  -Continue prednisone, bronchodilators, doxycycline  -Supportive care  Chronic hypoxic respiratory failure -Wears 2 L nasal cannula O2 at baseline  Chronic systolic and diastolic congestive heart failure -Stable without exacerbation, BNP 23.6  -Continue home lasix dose   Essential hypertension -Continue ARB-HCTZ, amlodipine, toprol   Coronary artery disease, history of CVA -Continue Plavix, lipitor   CKD stage 3 -Stable   Insulin-dependent diabetes, uncontrolled with hyperglycemia -Hemoglobin A1c 12.6 -Appreciate diabetic coordinator -Insulin dosing increased today 75 units twice daily, 25 units with meals along with resistant sliding scale insulin  Depression/anxiety -Continue buspar   DVT prophylaxis: lovenox Code Status:  Full Family Communication: No family at bedside Disposition Plan: Pending improvement. HHPT on discharge. Hopefully 12/21    Consultants:   None  Procedures:   None   Antimicrobials:  Anti-infectives (From admission, onward)   Start     Dose/Rate Route Frequency Ordered Stop   02/19/17 1000  doxycycline (VIBRA-TABS) tablet 100 mg     100 mg Oral Every 12 hours 02/19/17 0051     02/18/17 2100  azithromycin (ZITHROMAX) tablet 500 mg     500 mg Oral  Once 02/18/17 2059 02/18/17 2130       Subjective: Patient continues to be very short of breath at rest with wheezing but slightly better from yesterday. Blood sugar is still quite elevated and uncontrolled despite high doses of insulin. She states her blood sugars at home are always >300   Objective: Vitals:   02/21/17 2051 02/22/17 0511 02/22/17 0730 02/22/17 0924  BP: (!) 150/79 137/75 136/82   Pulse: 84 85 76   Resp: 18 18 18    Temp: 98.4 F (36.9 C) 97.9 F (36.6 C) 98 F (36.7 C)   TempSrc: Oral Oral Oral   SpO2: 95% 97% 100% 99%  Weight:      Height:        Intake/Output Summary (Last 24 hours) at 02/22/2017 1152 Last data filed at 02/22/2017 0854 Gross per 24 hour  Intake 220 ml  Output 0 ml  Net 220 ml   Filed Weights   02/18/17 1737 02/19/17 0100 02/20/17 2047  Weight: 108 kg (238 lb) 107.2 kg (236 lb 5.3 oz) 107.9 kg (237 lb 14 oz)    Examination:  General exam: Appears calm and comfortable  Respiratory system: Diffuse wheezes bilaterally, no respiratory distress, remains on nasal cannula O2 Cardiovascular  system: S1 & S2 heard, RRR. No JVD, murmurs, rubs, gallops or clicks. No pedal edema. Gastrointestinal system: Abdomen is nondistended, soft and nontender. No organomegaly or masses felt. Normal bowel sounds heard. Central nervous system: Alert and oriented. No focal neurological deficits. Extremities: Symmetric 5 x 5 power. Skin: No rashes, lesions or ulcers Psychiatry: Judgement and insight  appear normal. Mood & affect appropriate.   Data Reviewed: I have personally reviewed following labs and imaging studies  CBC: Recent Labs  Lab 02/18/17 1935 02/19/17 0551 02/21/17 0509 02/22/17 0539  WBC 7.1 7.0 10.8* 7.6  NEUTROABS 4.3  --   --   --   HGB 10.5* 9.7* 10.4* 9.9*  HCT 33.4* 31.3* 32.6* 31.8*  MCV 82.1 81.7 80.9 82.6  PLT 267 260 292 517   Basic Metabolic Panel: Recent Labs  Lab 02/18/17 1935 02/19/17 0551 02/21/17 0509 02/22/17 0539  NA 135 136 136 132*  K 3.7 4.3 3.9 3.5  CL 100* 103 99* 99*  CO2 25 25 26 23   GLUCOSE 265* 369* 297* 427*  BUN 14 15 24* 26*  CREATININE 1.27* 1.26* 1.24* 1.47*  CALCIUM 9.1 8.9 9.0 8.2*   GFR: Estimated Creatinine Clearance: 48.8 mL/min (A) (by C-G formula based on SCr of 1.47 mg/dL (H)). Liver Function Tests: No results for input(s): AST, ALT, ALKPHOS, BILITOT, PROT, ALBUMIN in the last 168 hours. No results for input(s): LIPASE, AMYLASE in the last 168 hours. No results for input(s): AMMONIA in the last 168 hours. Coagulation Profile: No results for input(s): INR, PROTIME in the last 168 hours. Cardiac Enzymes: Recent Labs  Lab 02/18/17 1935  TROPONINI <0.03   BNP (last 3 results) No results for input(s): PROBNP in the last 8760 hours. HbA1C: No results for input(s): HGBA1C in the last 72 hours. CBG: Recent Labs  Lab 02/21/17 1152 02/21/17 1705 02/21/17 2118 02/22/17 0729 02/22/17 1137  GLUCAP 318* 358* 297* 369* 167*   Lipid Profile: No results for input(s): CHOL, HDL, LDLCALC, TRIG, CHOLHDL, LDLDIRECT in the last 72 hours. Thyroid Function Tests: No results for input(s): TSH, T4TOTAL, FREET4, T3FREE, THYROIDAB in the last 72 hours. Anemia Panel: No results for input(s): VITAMINB12, FOLATE, FERRITIN, TIBC, IRON, RETICCTPCT in the last 72 hours. Sepsis Labs: No results for input(s): PROCALCITON, LATICACIDVEN in the last 168 hours.  Recent Results (from the past 240 hour(s))  Respiratory Panel by  PCR     Status: Abnormal   Collection Time: 02/18/17  9:25 PM  Result Value Ref Range Status   Adenovirus NOT DETECTED NOT DETECTED Final   Coronavirus 229E NOT DETECTED NOT DETECTED Final   Coronavirus HKU1 NOT DETECTED NOT DETECTED Final   Coronavirus NL63 NOT DETECTED NOT DETECTED Final   Coronavirus OC43 NOT DETECTED NOT DETECTED Final   Metapneumovirus NOT DETECTED NOT DETECTED Final   Rhinovirus / Enterovirus DETECTED (A) NOT DETECTED Final   Influenza A NOT DETECTED NOT DETECTED Final   Influenza B NOT DETECTED NOT DETECTED Final   Parainfluenza Virus 1 NOT DETECTED NOT DETECTED Final   Parainfluenza Virus 2 NOT DETECTED NOT DETECTED Final   Parainfluenza Virus 3 NOT DETECTED NOT DETECTED Final   Parainfluenza Virus 4 NOT DETECTED NOT DETECTED Final   Respiratory Syncytial Virus NOT DETECTED NOT DETECTED Final   Bordetella pertussis NOT DETECTED NOT DETECTED Final   Chlamydophila pneumoniae NOT DETECTED NOT DETECTED Final   Mycoplasma pneumoniae NOT DETECTED NOT DETECTED Final       Radiology Studies: No results found.  Scheduled Meds: . amLODipine  10 mg Oral Daily  . atorvastatin  80 mg Oral Daily  . busPIRone  30 mg Oral BID  . clopidogrel  75 mg Oral Daily  . dextromethorphan-guaiFENesin  1 tablet Oral BID  . doxycycline  100 mg Oral Q12H  . enoxaparin (LOVENOX) injection  40 mg Subcutaneous Q24H  . famotidine  20 mg Oral BID  . furosemide  10 mg Oral Daily  . gabapentin  300 mg Oral QHS  . losartan  100 mg Oral Daily   And  . hydrochlorothiazide  25 mg Oral Daily  . insulin aspart  0-20 Units Subcutaneous TID WC  . insulin aspart  0-5 Units Subcutaneous QHS  . insulin aspart  25 Units Subcutaneous TID WC  . insulin glargine  75 Units Subcutaneous BID  . Ipratropium-Albuterol  1 puff Inhalation BID  . loratadine  10 mg Oral Daily  . metoprolol succinate  25 mg Oral Daily  . mometasone-formoterol  2 puff Inhalation BID  . pantoprazole  40 mg Oral Daily   . predniSONE  50 mg Oral Q breakfast  . sodium chloride flush  3 mL Intravenous Q12H   Continuous Infusions:   LOS: 4 days    Time spent: 20 minutes   Dessa Phi, DO [facility] [email or web address] Password [email or web address] 02/22/2017, 11:52 AM

## 2017-02-22 NOTE — Care Management Note (Signed)
Case Management Note  Patient Details  Name: SCARLETTE HOGSTON MRN: [id number] Date of Birth: [birth year]  Subjective/Objective:                    Action/Plan:  PT/OT recommend HHPT/OT need orders and face to face . Paged MD  Expected Discharge Date:  02/22/17               Expected Discharge Plan:  [place]  In-House Referral:  NA  Discharge planning Services  CM Consult  Post Acute Care Choice:  Durable Medical Equipment, Home Health Choice offered to:  Patient  DME Arranged:  Tub bench, Walker rolling with seat, 3-N-1 DME Agency:  [place]:  PT [place]:  [place]  Status of Service:  In process, will continue to follow  If discussed at Long Length of Stay Meetings, dates discussed:    Additional Comments:  Marilu Favre, RN 02/22/2017, 11:33 AM

## 2017-02-22 NOTE — Progress Notes (Signed)
Pharmacy: Lovenox Dose Adjustment for VTE prophylaxis  OBJECTIVE:  Wt: 107.9 kg   Ht: 5'4"  BMI~40 SCr 1.47  CrCl~45-50 ml/min  ASSESSMENT:  61 YOM on lovenox for VTE prophylaxis requiring a dose adjustment for BMI>30 and CrCl>30 ml/min  PLAN:  1. Adjust Lovenox to 50 (~0.5 mg/kg) SQ every 24 hours 2. Pharmacy will monitor peripherally for s/sx of bleeding and any necessary dose adjustments   Alycia Rossetti, PharmD, BCPS Pager: [phone] 2:26 PM

## 2017-02-23 LAB — BASIC METABOLIC PANEL
Anion gap: 9 (ref 5–15)
BUN: 26 mg/dL — AB (ref 6–20)
CHLORIDE: 102 mmol/L (ref 101–111)
CO2: 24 mmol/L (ref 22–32)
CREATININE: 1.31 mg/dL — AB (ref 0.44–1.00)
Calcium: 8.9 mg/dL (ref 8.9–10.3)
GFR calc Af Amer: 50 mL/min — ABNORMAL LOW (ref >60)
GFR calc non Af Amer: 43 mL/min — ABNORMAL LOW (ref >60)
GLUCOSE: 187 mg/dL — AB (ref 65–99)
POTASSIUM: 3.4 mmol/L — AB (ref 3.5–5.1)
SODIUM: 135 mmol/L (ref 135–145)

## 2017-02-23 LAB — CBC
HCT: 34.9 % — ABNORMAL LOW (ref 36.0–46.0)
HEMOGLOBIN: 11.2 g/dL — AB (ref 12.0–15.0)
MCH: 26 pg (ref 26.0–34.0)
MCHC: 32.1 g/dL (ref 30.0–36.0)
MCV: 81 fL (ref 78.0–100.0)
PLATELETS: 307 10*3/uL (ref 150–400)
RBC: 4.31 MIL/uL (ref 3.87–5.11)
RDW: 15.6 % — ABNORMAL HIGH (ref 11.5–15.5)
WBC: 9.7 10*3/uL (ref 4.0–10.5)

## 2017-02-23 LAB — GLUCOSE, CAPILLARY
GLUCOSE-CAPILLARY: 193 mg/dL — AB (ref 65–99)
GLUCOSE-CAPILLARY: 311 mg/dL — AB (ref 65–99)
Glucose-Capillary: 300 mg/dL — ABNORMAL HIGH (ref 65–99)
Glucose-Capillary: 450 mg/dL — ABNORMAL HIGH (ref 65–99)

## 2017-02-23 MED ORDER — INSULIN ASPART 100 UNIT/ML ~~LOC~~ SOLN
30.0000 [IU] | Freq: Three times a day (TID) | SUBCUTANEOUS | Status: DC
  Administered 2017-02-23 – 2017-02-26 (×10): 30 [IU] via SUBCUTANEOUS

## 2017-02-23 MED ORDER — ALPRAZOLAM 0.5 MG PO TABS
1.0000 mg | ORAL_TABLET | Freq: Two times a day (BID) | ORAL | Status: DC | PRN
  Administered 2017-02-23 – 2017-02-26 (×6): 1 mg via ORAL
  Filled 2017-02-23 (×6): qty 2

## 2017-02-23 NOTE — Progress Notes (Signed)
Inpatient Diabetes Program Recommendations  AACE/ADA: New Consensus Statement on Inpatient Glycemic Control (2015)  Target Ranges:  Prepandial:   less than 140 mg/dL      Peak postprandial:   less than 180 mg/dL (1-2 hours)      Critically ill patients:  140 - 180 mg/dL   Lab Results  Component Value Date   GLUCAP 193 (H) 02/23/2017   HGBA1C 12.6 (H) 02/19/2017   Review of Glycemic Control  Diabetes history: DM 2 Outpatient Diabetes medications: Lantus 60 units bid + Novolog 30 units tid meal coverage Current orders for Inpatient glycemic control: Lantus 75 units bid + Novolog 25 units tid meal coverage + Novolog Resistance scale 0-20 units tid + NOvolog 0-5 units qhs  Inpatient Diabetes Program Recommendations:  Patient on PO prednisone 50 mg Daily Consider increasing Novolog Meal coverage to home dose 30 units tid.  Thanks,  Tama Headings RN, MSN, [facility] Inpatient Diabetes Coordinator Team Pager [phone] (8a-5p)

## 2017-02-23 NOTE — Progress Notes (Signed)
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 Patient reports she was in her usual state of health until 4-5 days ago when she started have worsening dyspnea, most notably productive cough of purulent sputum. She reports she takes Symbicort on a daily basis for her COPD. Other symptoms include upper airway congestion, sore throat, chest tightness. She reports that this felt like the same thing about a year ago when she was hospitalized for pneumonia per her report. Chest x-ray did not reveal acute infiltrate, did show cardiomegaly, BNP of 23. She was admitted for further treatment for COPD exacerbation.   Assessment & Plan:   Active Problems:   COPD (chronic obstructive pulmonary disease) (HCC)  COPD exacerbation with rhinovirus -Influenza negative  -Continue prednisone, bronchodilators, doxycycline completed 5 day course  -Supportive care  Chronic hypoxic respiratory failure -Wears 2 L nasal cannula O2 at baseline  Chronic systolic and diastolic congestive heart failure -Stable without exacerbation, BNP 23.6  -Continue home lasix dose   Essential hypertension -Continue ARB-HCTZ, amlodipine, toprol   Coronary artery disease, history of CVA -Continue Plavix, lipitor   CKD stage 3 -Stable   Insulin-dependent diabetes, uncontrolled with hyperglycemia -Hemoglobin A1c 12.6 -Appreciate diabetic coordinator -Insulin dosing increased today 75 units twice daily, 30 units with meals along with resistant sliding scale insulin  Depression/anxiety -Continue buspar   DVT prophylaxis:  lovenox Code Status: Full Family Communication: No family at bedside Disposition Plan: Pending improvement. HHPT on discharge. Hopefully 12/22    Consultants:   None  Procedures:   None   Antimicrobials:  Anti-infectives (From admission, onward)   Start     Dose/Rate Route Frequency Ordered Stop   02/19/17 1000  doxycycline (VIBRA-TABS) tablet 100 mg  Status:  Discontinued     100 mg Oral Every 12 hours 02/19/17 0051 02/23/17 0742   02/18/17 2100  azithromycin (ZITHROMAX) tablet 500 mg     500 mg Oral  Once 02/18/17 2059 02/18/17 2130       Subjective: No new events, but continues to be SOB and wheezing   Objective: Vitals:   02/23/17 0043 02/23/17 0506 02/23/17 0921 02/23/17 0941  BP: (!) 141/78 131/81 (!) 156/82   Pulse:  84 81   Resp:  18 18   Temp:  97.8 F (36.6 C) 97.8 F (36.6 C)   TempSrc:  Oral Oral   SpO2:  95% 96% 98%  Weight:      Height:        Intake/Output Summary (Last 24 hours) at 02/23/2017 1321 Last data filed at 02/23/2017 0600 Gross per 24 hour  Intake 560 ml  Output 375 ml  Net 185 ml   Filed Weights   02/19/17 0100 02/20/17 2047 02/22/17 2057  Weight: 107.2 kg (236 lb 5.3 oz) 107.9 kg (237 lb 14 oz) 108.1 kg (238 lb 5.1 oz)    Examination:  General exam: Appears calm and comfortable  Respiratory system: Diffuse wheezes bilaterally, no respiratory distress, remains on nasal cannula O2 Cardiovascular system: S1 & S2 heard, RRR. No JVD, murmurs, rubs, gallops or clicks. No pedal edema. Gastrointestinal system: Abdomen is nondistended, soft  and nontender. No organomegaly or masses felt. Normal bowel sounds heard. Central nervous system: Alert and oriented. No focal neurological deficits. Extremities: Symmetric 5 x 5 power. Skin: No rashes, lesions or ulcers Psychiatry: Judgement and insight appear normal. Mood & affect appropriate.   Data Reviewed: I have personally reviewed following labs and imaging studies  CBC: Recent Labs    Lab 2017-02-27 1935 02/19/17 0551 02/21/17 0509 02/22/17 0539 02/23/17 0728  WBC 7.1 7.0 10.8* 7.6 9.7  NEUTROABS 4.3  --   --   --   --   HGB 10.5* 9.7* 10.4* 9.9* 11.2*  HCT 33.4* 31.3* 32.6* 31.8* 34.9*  MCV 82.1 81.7 80.9 82.6 81.0  PLT 267 260 292 264 761   Basic Metabolic Panel: Recent Labs  Lab 2017/02/27 1935 02/19/17 0551 02/21/17 0509 02/22/17 0539 02/23/17 0728  NA 135 136 136 132* 135  K 3.7 4.3 3.9 3.5 3.4*  CL 100* 103 99* 99* 102  CO2 25 25 26 23 24   GLUCOSE 265* 369* 297* 427* 187*  BUN 14 15 24* 26* 26*  CREATININE 1.27* 1.26* 1.24* 1.47* 1.31*  CALCIUM 9.1 8.9 9.0 8.2* 8.9   GFR: Estimated Creatinine Clearance: 54.9 mL/min (A) (by C-G formula based on SCr of 1.31 mg/dL (H)). Liver Function Tests: No results for input(s): AST, ALT, ALKPHOS, BILITOT, PROT, ALBUMIN in the last 168 hours. No results for input(s): LIPASE, AMYLASE in the last 168 hours. No results for input(s): AMMONIA in the last 168 hours. Coagulation Profile: No results for input(s): INR, PROTIME in the last 168 hours. Cardiac Enzymes: Recent Labs  Lab 02/27/17 1935  TROPONINI <0.03   BNP (last 3 results) No results for input(s): PROBNP in the last 8760 hours. HbA1C: No results for input(s): HGBA1C in the last 72 hours. CBG: Recent Labs  Lab 02/22/17 1137 02/22/17 1700 02/22/17 2057 02/23/17 0805 02/23/17 1153  GLUCAP 167* 354* 403* 193* 300*   Lipid Profile: No results for input(s): CHOL, HDL, LDLCALC, TRIG, CHOLHDL, LDLDIRECT in the last 72 hours. Thyroid Function Tests: No results for input(s): TSH, T4TOTAL, FREET4, T3FREE, THYROIDAB in the last 72 hours. Anemia Panel: No results for input(s): VITAMINB12, FOLATE, FERRITIN, TIBC, IRON, RETICCTPCT in the last 72 hours. Sepsis Labs: No results for input(s): PROCALCITON, LATICACIDVEN in the last 168 hours.  Recent Results (from the past 240 hour(s))  Respiratory Panel by PCR     Status: Abnormal   Collection Time:  27-Feb-2017  9:25 PM  Result Value Ref Range Status   Adenovirus NOT DETECTED NOT DETECTED Final   Coronavirus 229E NOT DETECTED NOT DETECTED Final   Coronavirus HKU1 NOT DETECTED NOT DETECTED Final   Coronavirus NL63 NOT DETECTED NOT DETECTED Final   Coronavirus OC43 NOT DETECTED NOT DETECTED Final   Metapneumovirus NOT DETECTED NOT DETECTED Final   Rhinovirus / Enterovirus DETECTED (A) NOT DETECTED Final   Influenza A NOT DETECTED NOT DETECTED Final   Influenza B NOT DETECTED NOT DETECTED Final   Parainfluenza Virus 1 NOT DETECTED NOT DETECTED Final   Parainfluenza Virus 2 NOT DETECTED NOT DETECTED Final   Parainfluenza Virus 3 NOT DETECTED NOT DETECTED Final   Parainfluenza Virus 4 NOT DETECTED NOT DETECTED Final   Respiratory Syncytial Virus NOT DETECTED NOT DETECTED Final   Bordetella pertussis NOT DETECTED NOT DETECTED Final   Chlamydophila pneumoniae NOT DETECTED NOT DETECTED Final   Mycoplasma pneumoniae NOT DETECTED NOT DETECTED Final       Radiology Studies: No results found.  Scheduled Meds: . amLODipine  10 mg Oral Daily  . atorvastatin  80 mg Oral Daily  . busPIRone  30 mg Oral BID  . clopidogrel  75 mg Oral Daily  . dextromethorphan-guaiFENesin  1 tablet Oral BID  . enoxaparin (LOVENOX) injection  50 mg Subcutaneous Q24H  . famotidine  20 mg Oral BID  . furosemide  10 mg Oral Daily  . gabapentin  300 mg Oral QHS  . losartan  100 mg Oral Daily   And  . hydrochlorothiazide  25 mg Oral Daily  . insulin aspart  0-20 Units Subcutaneous TID WC  . insulin aspart  0-5 Units Subcutaneous QHS  . insulin aspart  30 Units Subcutaneous TID WC  . insulin glargine  75 Units Subcutaneous BID  . Ipratropium-Albuterol  1 puff Inhalation BID  . loratadine  10 mg Oral Daily  . metoprolol succinate  25 mg Oral Daily  . mometasone-formoterol  2 puff Inhalation BID  . pantoprazole  40 mg Oral Daily  . predniSONE  50 mg Oral Q breakfast  . sodium chloride flush  3 mL  Intravenous Q12H   Continuous Infusions:   LOS: 5 days    Time spent: 20 minutes   Dessa Phi, DO [facility] [email or web address] Password [email or web address] 02/23/2017, 1:21 PM

## 2017-02-24 LAB — GLUCOSE, CAPILLARY
GLUCOSE-CAPILLARY: 110 mg/dL — AB (ref 65–99)
GLUCOSE-CAPILLARY: 269 mg/dL — AB (ref 65–99)
Glucose-Capillary: 132 mg/dL — ABNORMAL HIGH (ref 65–99)
Glucose-Capillary: 301 mg/dL — ABNORMAL HIGH (ref 65–99)

## 2017-02-24 LAB — BASIC METABOLIC PANEL
ANION GAP: 8 (ref 5–15)
BUN: 27 mg/dL — ABNORMAL HIGH (ref 6–20)
CHLORIDE: 102 mmol/L (ref 101–111)
CO2: 29 mmol/L (ref 22–32)
Calcium: 8.6 mg/dL — ABNORMAL LOW (ref 8.9–10.3)
Creatinine, Ser: 1.35 mg/dL — ABNORMAL HIGH (ref 0.44–1.00)
GFR calc non Af Amer: 42 mL/min — ABNORMAL LOW (ref >60)
GFR, EST AFRICAN AMERICAN: 48 mL/min — AB (ref >60)
Glucose, Bld: 74 mg/dL (ref 65–99)
POTASSIUM: 2.8 mmol/L — AB (ref 3.5–5.1)
SODIUM: 139 mmol/L (ref 135–145)

## 2017-02-24 MED ORDER — POTASSIUM CHLORIDE CRYS ER 20 MEQ PO TBCR
40.0000 meq | EXTENDED_RELEASE_TABLET | ORAL | Status: AC
  Administered 2017-02-24 (×2): 40 meq via ORAL
  Filled 2017-02-24 (×2): qty 2

## 2017-02-24 NOTE — Care Management Note (Signed)
Case Management Note Original Note Created by Magdalen Spatz  Patient Details  Name: Christine Sims MRN: [id number] Date of Birth: 14-Dec-1955  Subjective/Objective:                    Action/Plan:  PT/OT recommend HHPT/OT need orders and face to face . Paged MD  Expected Discharge Date:  02/22/17               Expected Discharge Plan:  [place]  In-House Referral:  NA  Discharge planning Services  CM Consult  Post Acute Care Choice:  Durable Medical Equipment, Home Health Choice offered to:  Patient  DME Arranged:  Tub bench, Walker rolling with seat, 3-N-1 DME Agency:  [place]:  PT, OT, RN [facility] Agency:  [place]  Status of Service:  Completed, signed off  If discussed at [place] of Stay Meetings, dates discussed:    Additional Comments: CM informed [facility] that RN and OT has been added for [facility]. Maryclare Labrador, RN 02/24/2017, 8:55 AM

## 2017-02-24 NOTE — Progress Notes (Signed)
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 Patient reports she was in her usual state of health until 4-5 days ago when she started have worsening dyspnea, most notably productive cough of purulent sputum. She reports she takes Symbicort on a daily basis for her COPD. Other symptoms include upper airway congestion, sore throat, chest tightness. She reports that this felt like the same thing about a year ago when she was hospitalized for pneumonia per her report. Chest x-ray did not reveal acute infiltrate, did show cardiomegaly, BNP of 23. She was admitted for further treatment for COPD exacerbation.   Assessment & Plan:   Active Problems:   COPD (chronic obstructive pulmonary disease) (HCC)  COPD exacerbation with rhinovirus -Influenza negative  -Continue prednisone, bronchodilators, doxycycline completed 5 day course   Chronic hypoxic respiratory failure -Wears 2 L nasal cannula O2 at baseline  Chronic systolic and diastolic congestive heart failure -Stable without exacerbation, BNP 23.6  -Continue home lasix dose   Essential hypertension -Continue ARB-HCTZ, amlodipine, toprol   Coronary artery disease, history of CVA -Continue Plavix, lipitor   CKD stage 3 -Stable   Insulin-dependent diabetes, uncontrolled with hyperglycemia -Hemoglobin A1c 12.6 -Appreciate diabetic coordinator -Insulin dosing 75 units twice daily, 30 units with meals along with resistant sliding scale insulin. Will need insulin dose adjustment, glucometer and testing supplies on discharge.    Depression/anxiety -Continue buspar   DVT prophylaxis: lovenox Code Status: Full Family Communication: No family at bedside Disposition Plan: Pending improvement. HHPT on discharge. Hopefully 12/23    Consultants:   None  Procedures:   None   Antimicrobials:  Anti-infectives (From admission, onward)   Start     Dose/Rate Route Frequency Ordered Stop   02/19/17 1000  doxycycline (VIBRA-TABS) tablet 100 mg  Status:  Discontinued     100 mg Oral Every 12 hours 02/19/17 0051 02/23/17 0742   02/18/17 2100  azithromycin (ZITHROMAX) tablet 500 mg     500 mg Oral  Once 02/18/17 2059 02/18/17 2130       Subjective: No new events, states that her breathing seems slightly better but not back to baseline.    Objective: Vitals:   02/23/17 1610 02/23/17 2030 02/23/17 2055 02/24/17 0611  BP: (!) 146/74  129/67 (!) 148/86  Pulse: 87  92 80  Resp: 18  18 18   Temp: 98.2 F (36.8 C)  98 F (36.7 C) (!) 97.5 F (36.4 C)  TempSrc: Oral  Oral Oral  SpO2: 97% 97% 95% 96%  Weight:   108.1 kg (238 lb 6 oz)   Height:        Intake/Output Summary (Last 24 hours) at 02/24/2017 6294 Last data filed at 02/23/2017 2056 Gross per 24 hour  Intake 240 ml  Output 0 ml  Net 240 ml   Filed Weights   02/20/17 2047 02/22/17 2057 02/23/17 2055  Weight: 107.9 kg (237 lb 14 oz) 108.1 kg (238 lb 5.1 oz) 108.1 kg (238 lb 6 oz)    Examination:  General exam: Appears calm and comfortable  Respiratory system: Diffuse wheezes bilaterally although improved from yesterday's exam, no respiratory distress,  remains on nasal cannula O2 Cardiovascular system: S1 & S2 heard, RRR. No JVD, murmurs, rubs, gallops or clicks. No pedal edema. Gastrointestinal system: Abdomen is nondistended, soft and nontender. No organomegaly or masses felt. Normal bowel sounds heard. Central nervous system: Alert and oriented. No focal neurological deficits. Extremities: Symmetric 5 x 5 power. Skin: No rashes, lesions or  ulcers Psychiatry: Judgement and insight appear normal. Mood & affect appropriate.   Data Reviewed: I have personally reviewed following labs and imaging studies  CBC: Recent Labs  Lab 02/18/17 1935 02/19/17 0551 02/21/17 0509 02/22/17 0539 02/23/17 0728  WBC 7.1 7.0 10.8* 7.6 9.7  NEUTROABS 4.3  --   --   --   --   HGB 10.5* 9.7* 10.4* 9.9* 11.2*  HCT 33.4* 31.3* 32.6* 31.8* 34.9*  MCV 82.1 81.7 80.9 82.6 81.0  PLT 267 260 292 264 902   Basic Metabolic Panel: Recent Labs  Lab 02/18/17 1935 02/19/17 0551 02/21/17 0509 02/22/17 0539 02/23/17 0728  NA 135 136 136 132* 135  K 3.7 4.3 3.9 3.5 3.4*  CL 100* 103 99* 99* 102  CO2 25 25 26 23 24   GLUCOSE 265* 369* 297* 427* 187*  BUN 14 15 24* 26* 26*  CREATININE 1.27* 1.26* 1.24* 1.47* 1.31*  CALCIUM 9.1 8.9 9.0 8.2* 8.9   GFR: Estimated Creatinine Clearance: 54.9 mL/min (A) (by C-G formula based on SCr of 1.31 mg/dL (H)). Liver Function Tests: No results for input(s): AST, ALT, ALKPHOS, BILITOT, PROT, ALBUMIN in the last 168 hours. No results for input(s): LIPASE, AMYLASE in the last 168 hours. No results for input(s): AMMONIA in the last 168 hours. Coagulation Profile: No results for input(s): INR, PROTIME in the last 168 hours. Cardiac Enzymes: Recent Labs  Lab 02/18/17 1935  TROPONINI <0.03   BNP (last 3 results) No results for input(s): PROBNP in the last 8760 hours. HbA1C: No results for input(s): HGBA1C in the last 72 hours. CBG: Recent Labs  Lab 02/23/17 0805 02/23/17 1153 02/23/17 1648 02/23/17 2054 02/24/17 0737  GLUCAP 193* 300* 450* 311* 132*   Lipid Profile: No results for input(s): CHOL, HDL, LDLCALC, TRIG, CHOLHDL, LDLDIRECT in the last 72 hours. Thyroid Function Tests: No results for input(s): TSH, T4TOTAL, FREET4, T3FREE, THYROIDAB in the last 72 hours. Anemia Panel: No results for input(s): VITAMINB12, FOLATE, FERRITIN, TIBC, IRON, RETICCTPCT in the last 72 hours. Sepsis Labs: No  results for input(s): PROCALCITON, LATICACIDVEN in the last 168 hours.  Recent Results (from the past 240 hour(s))  Respiratory Panel by PCR     Status: Abnormal   Collection Time: 02/18/17  9:25 PM  Result Value Ref Range Status   Adenovirus NOT DETECTED NOT DETECTED Final   Coronavirus 229E NOT DETECTED NOT DETECTED Final   Coronavirus HKU1 NOT DETECTED NOT DETECTED Final   Coronavirus NL63 NOT DETECTED NOT DETECTED Final   Coronavirus OC43 NOT DETECTED NOT DETECTED Final   Metapneumovirus NOT DETECTED NOT DETECTED Final   Rhinovirus / Enterovirus DETECTED (A) NOT DETECTED Final   Influenza A NOT DETECTED NOT DETECTED Final   Influenza B NOT DETECTED NOT DETECTED Final   Parainfluenza Virus 1 NOT DETECTED NOT DETECTED Final   Parainfluenza Virus 2 NOT DETECTED NOT DETECTED Final   Parainfluenza Virus 3 NOT DETECTED NOT DETECTED Final   Parainfluenza Virus 4 NOT DETECTED NOT DETECTED Final   Respiratory Syncytial Virus NOT DETECTED NOT DETECTED Final   Bordetella pertussis NOT DETECTED NOT DETECTED Final   Chlamydophila pneumoniae  NOT DETECTED NOT DETECTED Final   Mycoplasma pneumoniae NOT DETECTED NOT DETECTED Final       Radiology Studies: No results found.    Scheduled Meds: . amLODipine  10 mg Oral Daily  . atorvastatin  80 mg Oral Daily  . busPIRone  30 mg Oral BID  . clopidogrel  75 mg Oral Daily  . dextromethorphan-guaiFENesin  1 tablet Oral BID  . enoxaparin (LOVENOX) injection  50 mg Subcutaneous Q24H  . famotidine  20 mg Oral BID  . furosemide  10 mg Oral Daily  . gabapentin  300 mg Oral QHS  . losartan  100 mg Oral Daily   And  . hydrochlorothiazide  25 mg Oral Daily  . insulin aspart  0-20 Units Subcutaneous TID WC  . insulin aspart  0-5 Units Subcutaneous QHS  . insulin aspart  30 Units Subcutaneous TID WC  . insulin glargine  75 Units Subcutaneous BID  . Ipratropium-Albuterol  1 puff Inhalation BID  . loratadine  10 mg Oral Daily  . metoprolol  succinate  25 mg Oral Daily  . mometasone-formoterol  2 puff Inhalation BID  . pantoprazole  40 mg Oral Daily  . predniSONE  50 mg Oral Q breakfast  . sodium chloride flush  3 mL Intravenous Q12H   Continuous Infusions:   LOS: 6 days    Time spent: 20 minutes   Dessa Phi, DO [facility] [email or web address] Password [email or web address] 02/24/2017, 8:22 AM

## 2017-02-25 ENCOUNTER — Encounter (HOSPITAL_COMMUNITY): Payer: Self-pay | Admitting: Internal Medicine

## 2017-02-25 DIAGNOSIS — I10 Essential (primary) hypertension: Secondary | ICD-10-CM

## 2017-02-25 DIAGNOSIS — F32A Depression, unspecified: Secondary | ICD-10-CM

## 2017-02-25 DIAGNOSIS — F329 Major depressive disorder, single episode, unspecified: Secondary | ICD-10-CM | POA: Diagnosis present

## 2017-02-25 DIAGNOSIS — N183 Chronic kidney disease, stage 3 unspecified: Secondary | ICD-10-CM | POA: Diagnosis present

## 2017-02-25 DIAGNOSIS — E119 Type 2 diabetes mellitus without complications: Secondary | ICD-10-CM

## 2017-02-25 DIAGNOSIS — Z794 Long term (current) use of insulin: Secondary | ICD-10-CM

## 2017-02-25 DIAGNOSIS — I5042 Chronic combined systolic (congestive) and diastolic (congestive) heart failure: Secondary | ICD-10-CM | POA: Diagnosis present

## 2017-02-25 DIAGNOSIS — IMO0001 Reserved for inherently not codable concepts without codable children: Secondary | ICD-10-CM

## 2017-02-25 DIAGNOSIS — B348 Other viral infections of unspecified site: Secondary | ICD-10-CM | POA: Diagnosis present

## 2017-02-25 DIAGNOSIS — J441 Chronic obstructive pulmonary disease with (acute) exacerbation: Secondary | ICD-10-CM | POA: Diagnosis present

## 2017-02-25 HISTORY — DX: Chronic kidney disease, stage 3 unspecified: N18.30

## 2017-02-25 HISTORY — DX: Depression, unspecified: F32.A

## 2017-02-25 HISTORY — DX: Essential (primary) hypertension: I10

## 2017-02-25 LAB — BASIC METABOLIC PANEL
Anion gap: 8 (ref 5–15)
BUN: 31 mg/dL — ABNORMAL HIGH (ref 6–20)
CALCIUM: 9.3 mg/dL (ref 8.9–10.3)
CO2: 26 mmol/L (ref 22–32)
CREATININE: 1.39 mg/dL — AB (ref 0.44–1.00)
Chloride: 105 mmol/L (ref 101–111)
GFR, EST AFRICAN AMERICAN: 47 mL/min — AB (ref >60)
GFR, EST NON AFRICAN AMERICAN: 40 mL/min — AB (ref >60)
Glucose, Bld: 55 mg/dL — ABNORMAL LOW (ref 65–99)
Potassium: 4 mmol/L (ref 3.5–5.1)
SODIUM: 139 mmol/L (ref 135–145)

## 2017-02-25 LAB — CBC
HCT: 35.1 % — ABNORMAL LOW (ref 36.0–46.0)
Hemoglobin: 11 g/dL — ABNORMAL LOW (ref 12.0–15.0)
MCH: 25.8 pg — ABNORMAL LOW (ref 26.0–34.0)
MCHC: 31.3 g/dL (ref 30.0–36.0)
MCV: 82.2 fL (ref 78.0–100.0)
PLATELETS: 288 10*3/uL (ref 150–400)
RBC: 4.27 MIL/uL (ref 3.87–5.11)
RDW: 16.3 % — AB (ref 11.5–15.5)
WBC: 11.5 10*3/uL — ABNORMAL HIGH (ref 4.0–10.5)

## 2017-02-25 LAB — GLUCOSE, CAPILLARY
GLUCOSE-CAPILLARY: 51 mg/dL — AB (ref 65–99)
GLUCOSE-CAPILLARY: 302 mg/dL — AB (ref 65–99)
Glucose-Capillary: 137 mg/dL — ABNORMAL HIGH (ref 65–99)
Glucose-Capillary: 142 mg/dL — ABNORMAL HIGH (ref 65–99)
Glucose-Capillary: 175 mg/dL — ABNORMAL HIGH (ref 65–99)

## 2017-02-25 LAB — MAGNESIUM: Magnesium: 2.2 mg/dL (ref 1.7–2.4)

## 2017-02-25 MED ORDER — METHYLPREDNISOLONE SODIUM SUCC 125 MG IJ SOLR
60.0000 mg | Freq: Once | INTRAMUSCULAR | Status: AC
  Administered 2017-02-25: 60 mg via INTRAVENOUS
  Filled 2017-02-25: qty 2

## 2017-02-25 MED ORDER — INSULIN GLARGINE 100 UNIT/ML ~~LOC~~ SOLN
70.0000 [IU] | Freq: Two times a day (BID) | SUBCUTANEOUS | Status: DC
  Administered 2017-02-25 – 2017-02-26 (×2): 70 [IU] via SUBCUTANEOUS
  Filled 2017-02-25 (×3): qty 0.7

## 2017-02-25 MED ORDER — LOPERAMIDE HCL 2 MG PO CAPS
4.0000 mg | ORAL_CAPSULE | Freq: Once | ORAL | Status: AC
  Administered 2017-02-25: 4 mg via ORAL
  Filled 2017-02-25: qty 2

## 2017-02-25 MED ORDER — DM-GUAIFENESIN ER 30-600 MG PO TB12
2.0000 | ORAL_TABLET | Freq: Two times a day (BID) | ORAL | Status: DC
  Administered 2017-02-25 – 2017-02-26 (×2): 2 via ORAL
  Filled 2017-02-25 (×2): qty 2

## 2017-02-25 MED ORDER — IPRATROPIUM-ALBUTEROL 0.5-2.5 (3) MG/3ML IN SOLN: 3.0000 mL | RESPIRATORY_TRACT | Status: DC | PRN

## 2017-02-25 MED ORDER — BUDESONIDE 0.5 MG/2ML IN SUSP
0.5000 mg | Freq: Two times a day (BID) | RESPIRATORY_TRACT | Status: DC
  Administered 2017-02-26: 0.5 mg via RESPIRATORY_TRACT
  Filled 2017-02-25 (×3): qty 2

## 2017-02-25 MED ORDER — FLUTICASONE PROPIONATE 50 MCG/ACT NA SUSP
2.0000 | Freq: Every day | NASAL | Status: DC
  Administered 2017-02-25: 2 via NASAL
  Filled 2017-02-25: qty 16

## 2017-02-25 MED ORDER — IPRATROPIUM-ALBUTEROL 0.5-2.5 (3) MG/3ML IN SOLN: 3.0000 mL | Freq: Four times a day (QID) | RESPIRATORY_TRACT | Status: DC

## 2017-02-25 NOTE — Progress Notes (Signed)
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 Patient reports she was in her usual state of health until 4-5 days ago when she started have worsening dyspnea, most notably productive cough of purulent sputum. She reports she takes Symbicort on a daily basis for her COPD. Other symptoms include upper airway congestion, sore throat, chest tightness. She reports that this felt like the same thing about a year ago when she was hospitalized for pneumonia per her report. Chest x-ray did not reveal acute infiltrate, did show cardiomegaly, BNP of 23.She was admitted for further treatment for COPD exacerbation.       Assessment & Plan:   Principal Problem:   COPD with acute exacerbation ([place]) Active Problems:   COPD (chronic obstructive pulmonary disease) (HCC)   Rhinovirus infection   HTN (hypertension)   Chronic combined systolic and diastolic heart failure (HCC)   CKD (chronic kidney disease) stage 3, GFR 30-59 ml/min (HCC)   Diabetes mellitus, insulin dependent (IDDM), controlled ([place])   Depression   #1 acute on chronic respiratory failure secondary to acute COPD exacerbation with rhinovirus Influenza was negative.  Respiratory panel positive for rhinovirus.  Patient still with some expiratory wheezing as well as a component of upper airway noise.  We will give a dose of IV Solu-Medrol 60 mg x1 and continue current oral prednisone taper.  Discontinue Dulera and placed on Pulmicort twice daily.  Discontinue MDIs in place on scheduled albuterol nebs 3 times daily.  Continue Claritin.  Add Flonase.   Continue Pepcid.  Patient status post 5 days of doxycycline and no further antibiotics needed at this time.  2.  Chronic combined systolic and diastolic heart failure Currently stable.  Compensated.  BNP was 23.6.  Continue home regimen and diuretics.  3.  Hypertension Continue Norvasc, Lasix, Cozaar, HCTZ, Toprol.  Follow.  4.  Poorly controlled diabetes mellitus Hemoglobin A1c was 12.6.  CBGs have ranged from 51-175. Continue current dose of Lantus, meal coverage insulin, sliding scale insulin.  Patient received a dose of IV Solu-Medrol today.  Follow.  5.  Coronary artery disease/history of CVA Stable.  Continue Plavix, Lipitor, beta-blocker, ARB.  6.  Chronic kidney disease stage III Stable.  7.  Depression/anxiety Continue BuSpar.    DVT prophylaxis: Lovenox Code Status: Full Family Communication: Updated patient.  No family at bedside. Disposition Plan: Home once acute exacerbation has improved and patient close to her baseline.   Consultants:   None  Procedures:   Chest x-ray 02/18/2017  Antimicrobials:   Doxycycline 02/19/2017>>>>> 02/23/2017   Subjective: Sitting up in bed stating that no significant improvement with her breathing.  Patient states she still wheezing.  Denies any chest pain.  Objective: Vitals:   02/24/17 1800 02/24/17 2143 02/25/17 0518 02/25/17 0852  BP: (!) 141/85 (!) 150/79 137/77 120/70  Pulse: 77 87 80 81  Resp: 18 17 18 17   Temp: 98 F (36.7 C) 98.8 F (37.1 C) 98.1 F (36.7 C) 98.1 F (36.7 C)  TempSrc: Oral Oral Oral Oral  SpO2: 98% 97% 100% 100%  Weight:  108.1 kg (238 lb 5.9 oz)    Height:  Intake/Output Summary (Last 24 hours) at 02/25/2017 1935 Last data filed at 02/25/2017 1000 Gross per 24 hour  Intake 963 ml  Output 2 ml  Net 961 ml   Filed Weights   02/22/17 2057 02/23/17 2055 02/24/17 2143  Weight: 108.1 kg (238 lb 5.1 oz) 108.1 kg (238 lb 6 oz) 108.1 kg (238 lb 5.9 oz)     Examination:  General exam: Appears calm and comfortable  Respiratory system: Diffuse expiratory wheezing.  No rhonchi.  No crackles.  Upper airway noise.  Respiratory effort normal. Cardiovascular system: S1 & S2 heard, RRR. No JVD, murmurs, rubs, gallops or clicks. No pedal edema. Gastrointestinal system: Abdomen is nondistended, soft and nontender. No organomegaly or masses felt. Normal bowel sounds heard. Central nervous system: Alert and oriented. No focal neurological deficits. Extremities: Symmetric 5 x 5 power. Skin: No rashes, lesions or ulcers Psychiatry: Judgement and insight appear normal. Mood & affect appropriate.     Data Reviewed: I have personally reviewed following labs and imaging studies  CBC: Recent Labs  Lab 02/19/17 0551 02/21/17 0509 02/22/17 0539 02/23/17 0728 02/25/17 0617  WBC 7.0 10.8* 7.6 9.7 11.5*  HGB 9.7* 10.4* 9.9* 11.2* 11.0*  HCT 31.3* 32.6* 31.8* 34.9* 35.1*  MCV 81.7 80.9 82.6 81.0 82.2  PLT 260 292 264 307 440   Basic Metabolic Panel: Recent Labs  Lab 02/21/17 0509 02/22/17 0539 02/23/17 0728 02/24/17 1030 02/25/17 0617  NA 136 132* 135 139 139  K 3.9 3.5 3.4* 2.8* 4.0  CL 99* 99* 102 102 105  CO2 26 23 24 29 26   GLUCOSE 297* 427* 187* 74 55*  BUN 24* 26* 26* 27* 31*  CREATININE 1.24* 1.47* 1.31* 1.35* 1.39*  CALCIUM 9.0 8.2* 8.9 8.6* 9.3  MG  --   --   --   --  2.2   GFR: Estimated Creatinine Clearance: 51.7 mL/min (A) (by C-G formula based on SCr of 1.39 mg/dL (H)). Liver Function Tests: No results for input(s): AST, ALT, ALKPHOS, BILITOT, PROT, ALBUMIN in the last 168 hours. No results for input(s): LIPASE, AMYLASE in the last 168 hours. No results for input(s): AMMONIA in the last 168 hours. Coagulation Profile: No results for input(s): INR, PROTIME in the last 168 hours. Cardiac Enzymes: No results for input(s): CKTOTAL, CKMB, CKMBINDEX, TROPONINI in the last 168 hours. BNP (last 3 results) No results for  input(s): PROBNP in the last 8760 hours. HbA1C: No results for input(s): HGBA1C in the last 72 hours. CBG: Recent Labs  Lab 02/24/17 2139 02/25/17 0800 02/25/17 0850 02/25/17 1149 02/25/17 1648  GLUCAP 269* 51* 137* 142* 175*   Lipid Profile: No results for input(s): CHOL, HDL, LDLCALC, TRIG, CHOLHDL, LDLDIRECT in the last 72 hours. Thyroid Function Tests: No results for input(s): TSH, T4TOTAL, FREET4, T3FREE, THYROIDAB in the last 72 hours. Anemia Panel: No results for input(s): VITAMINB12, FOLATE, FERRITIN, TIBC, IRON, RETICCTPCT in the last 72 hours. Sepsis Labs: No results for input(s): PROCALCITON, LATICACIDVEN in the last 168 hours.  Recent Results (from the past 240 hour(s))  Respiratory Panel by PCR     Status: Abnormal   Collection Time: 02/18/17  9:25 PM  Result Value Ref Range Status   Adenovirus NOT DETECTED NOT DETECTED Final   Coronavirus 229E NOT DETECTED NOT DETECTED Final   Coronavirus HKU1 NOT DETECTED NOT DETECTED Final   Coronavirus NL63 NOT DETECTED NOT DETECTED Final   Coronavirus OC43 NOT DETECTED NOT DETECTED Final   Metapneumovirus NOT DETECTED NOT DETECTED  Final   Rhinovirus / Enterovirus DETECTED (A) NOT DETECTED Final   Influenza A NOT DETECTED NOT DETECTED Final   Influenza B NOT DETECTED NOT DETECTED Final   Parainfluenza Virus 1 NOT DETECTED NOT DETECTED Final   Parainfluenza Virus 2 NOT DETECTED NOT DETECTED Final   Parainfluenza Virus 3 NOT DETECTED NOT DETECTED Final   Parainfluenza Virus 4 NOT DETECTED NOT DETECTED Final   Respiratory Syncytial Virus NOT DETECTED NOT DETECTED Final   Bordetella pertussis NOT DETECTED NOT DETECTED Final   Chlamydophila pneumoniae NOT DETECTED NOT DETECTED Final   Mycoplasma pneumoniae NOT DETECTED NOT DETECTED Final         Radiology Studies: No results found.      Scheduled Meds: . amLODipine  10 mg Oral Daily  . atorvastatin  80 mg Oral Daily  . budesonide (PULMICORT) nebulizer solution   0.5 mg Nebulization BID  . busPIRone  30 mg Oral BID  . clopidogrel  75 mg Oral Daily  . dextromethorphan-guaiFENesin  2 tablet Oral BID  . enoxaparin (LOVENOX) injection  50 mg Subcutaneous Q24H  . famotidine  20 mg Oral BID  . fluticasone  2 spray Each Nare Daily  . furosemide  10 mg Oral Daily  . gabapentin  300 mg Oral QHS  . losartan  100 mg Oral Daily   And  . hydrochlorothiazide  25 mg Oral Daily  . insulin aspart  0-20 Units Subcutaneous TID WC  . insulin aspart  0-5 Units Subcutaneous QHS  . insulin aspart  30 Units Subcutaneous TID WC  . insulin glargine  75 Units Subcutaneous BID  . loratadine  10 mg Oral Daily  . metoprolol succinate  25 mg Oral Daily  . pantoprazole  40 mg Oral Daily  . predniSONE  50 mg Oral Q breakfast  . sodium chloride flush  3 mL Intravenous Q12H   Continuous Infusions:   LOS: 7 days    Time spent: 35 minutes    Irine Seal, MD [facility] Pager [phone]  If 7PM-7AM, please contact night-coverage [email or web address] Password [facility] 02/25/2017, 7:35 PM

## 2017-02-26 LAB — BASIC METABOLIC PANEL
ANION GAP: 8 (ref 5–15)
BUN: 37 mg/dL — ABNORMAL HIGH (ref 6–20)
CALCIUM: 9.4 mg/dL (ref 8.9–10.3)
CO2: 26 mmol/L (ref 22–32)
Chloride: 102 mmol/L (ref 101–111)
Creatinine, Ser: 1.55 mg/dL — ABNORMAL HIGH (ref 0.44–1.00)
GFR, EST AFRICAN AMERICAN: 41 mL/min — AB (ref >60)
GFR, EST NON AFRICAN AMERICAN: 35 mL/min — AB (ref >60)
Glucose, Bld: 287 mg/dL — ABNORMAL HIGH (ref 65–99)
Potassium: 4.3 mmol/L (ref 3.5–5.1)
Sodium: 136 mmol/L (ref 135–145)

## 2017-02-26 LAB — CBC
HEMATOCRIT: 34.5 % — AB (ref 36.0–46.0)
Hemoglobin: 10.7 g/dL — ABNORMAL LOW (ref 12.0–15.0)
MCH: 25.2 pg — ABNORMAL LOW (ref 26.0–34.0)
MCHC: 31 g/dL (ref 30.0–36.0)
MCV: 81.2 fL (ref 78.0–100.0)
PLATELETS: 271 10*3/uL (ref 150–400)
RBC: 4.25 MIL/uL (ref 3.87–5.11)
RDW: 16.6 % — AB (ref 11.5–15.5)
WBC: 11.3 10*3/uL — AB (ref 4.0–10.5)

## 2017-02-26 LAB — GLUCOSE, CAPILLARY
GLUCOSE-CAPILLARY: 260 mg/dL — AB (ref 65–99)
GLUCOSE-CAPILLARY: 181 mg/dL — AB (ref 65–99)
Glucose-Capillary: 213 mg/dL — ABNORMAL HIGH (ref 65–99)

## 2017-02-26 MED ORDER — FLUTICASONE PROPIONATE 50 MCG/ACT NA SUSP: 2.0000 | Freq: Every day | NASAL | 0 refills | Status: AC

## 2017-02-26 MED ORDER — LORATADINE 10 MG PO TABS: 10.0000 mg | ORAL_TABLET | Freq: Every day | ORAL | 0 refills | Status: DC

## 2017-02-26 MED ORDER — DM-GUAIFENESIN ER 30-600 MG PO TB12: 2.0000 | ORAL_TABLET | Freq: Two times a day (BID) | ORAL | 0 refills | Status: AC

## 2017-02-26 MED ORDER — ALBUTEROL SULFATE (2.5 MG/3ML) 0.083% IN NEBU: 2.5000 mg | INHALATION_SOLUTION | RESPIRATORY_TRACT | 0 refills | Status: AC | PRN

## 2017-02-26 MED ORDER — INSULIN GLARGINE 100 UNITS/ML SOLOSTAR PEN: 70.0000 [IU] | PEN_INJECTOR | Freq: Two times a day (BID) | SUBCUTANEOUS | 0 refills | Status: DC

## 2017-02-26 MED ORDER — BLOOD GLUCOSE METER KIT: PACK | 0 refills | Status: AC

## 2017-02-26 MED ORDER — LOPERAMIDE HCL 2 MG PO CAPS: 2.0000 mg | ORAL_CAPSULE | ORAL | Status: DC | PRN

## 2017-02-26 MED ORDER — PANTOPRAZOLE SODIUM 40 MG PO TBEC: 40.0000 mg | DELAYED_RELEASE_TABLET | Freq: Every day | ORAL | 0 refills | Status: DC

## 2017-02-26 MED ORDER — PREDNISONE 10 MG PO TABS: 10.0000 mg | ORAL_TABLET | Freq: Every day | ORAL | 0 refills | Status: DC

## 2017-02-26 NOTE — Progress Notes (Signed)
Patient stated that her daughter is coming to get her. When I asked her, patient stated, "The doctor said he was going to D/C me either this afternoon or tomorrow and I want to go tonight." I explained to the patient that I would contact the MD and ask if he plans to D/C her tonight. If not, she will have to leave AMA. Patient stated that if she had to, she would leave AMA. MD notified regarding this matter. MD stated that he was going to D/C patient. Patient notified of this information and is agreeing to stay until her D/C orders are in and paperwork has been given. Will continue to monitor.

## 2017-02-26 NOTE — Discharge Summary (Signed)
Physician Discharge Summary  Christine Sims ZOX:[id number] DOB: Dec 22, 1955 DOA: 02/18/2017  PCP: Kristopher Glee., MD  Admit date: 02/18/2017 Discharge date: 02/26/2017  Time spent: 60 minutes  Recommendations for Outpatient Follow-up:  1. Follow-up with Kristopher Glee., MD in 2 weeks.  On follow-up patient's COPD exacerbation will need to be reassessed.  Patient's Lantus was adjusted and dose increased and as such patient's diabetes will need to be followed up upon.  Patient will need a basic metabolic profile done on follow-up to follow-up on electrolytes and renal function.   Discharge Diagnoses:  Principal Problem:   COPD with acute exacerbation ([place]) Active Problems:   COPD (chronic obstructive pulmonary disease) (H[facility])   Rhinovirus infection   HTN (hypertension)   Chronic combined systolic and diastolic heart failure ([facility])   CKD (chronic kidney disease) stage 3, GFR 30-59 ml/min ([facility])   Diabetes mellitus, insulin dependent (IDDM), controlled ([place])   Depression   Discharge Condition: Stable and improved  Diet recommendation: Carb modified diet  Filed Weights   02/22/17 2057 02/23/17 2055 02/24/17 2143  Weight: 108.1 kg (238 lb 5.1 oz) 108.1 kg (238 lb 6 oz) 108.1 kg (238 lb 5.9 oz)    History of present illness:  Per Dr Stana Bunting This 61 year old obese woman with chronic hypoxic respiratory failure on 2 L nasal cannula O2, reportedly COPD, congestive heart failure unknown diastolic or systolic status, insulin-dependent diabetes, CAD with PCI over decade ago, history of CVA with some speech difficulty and residual right-sided weakness, who presented with shortness of breath.  Patient reported she was in her usual state of health until 4-5 days ago when she started have worsening dyspnea, most notably productive cough of purulent sputum. She reports she takes Symbicort on a daily basis for her COPD. Other symptoms include upper airway congestion, sore throat, chest tightness.  She reported that this felt like the same thing about a year ago when she was hospitalized for pneumonia per her report.  She lives at home with her daughter, in [place], reported being independent in her ADLs, needs assistance with her IADLs. She walks with a 4 wheeled walker.  She reported more wheezing.  Due to her symptoms are failing to improve she decided to seek medical attention.  ED Course: The emergency department she had elevated respiratory rate (26) which normalized with treatment, normal heart rate, systolic blood pressure ranging in the 120 to 140s. CBC remarkable for normocytic anemia with hemoglobin of 10.5. Creatinine 1.27, blood sugar 265. Chest x-ray did not reveal acute infiltrate, did show cardiomegaly, BNP of 23.  She was given nebulized bronchodilators, azithromycin, and magnesium.  Hospital medicine consulted for further management.    Hospital Course:  #1 acute on chronic respiratory failure secondary to acute COPD exacerbation with rhinovirus Patient was admitted with acute on chronic respiratory failure secondary to an acute COPD exacerbation with rhinovirus.  Patient was admitted placed on nebulizer treatments, antibiotics, IV steroid taper.  Influenza  PCR was negative.  Respiratory panel positive for rhinovirus.  Patient IV steroid with taper down to oral prednisone.  Patient was placed on [facility] which was subsequently discontinued and patient placed on Pulmicort due to ongoing wheezing.  MDIs which patient was on was changed to scheduled nebulizers which patient tolerated with improvement with no wheezing.  Claritin Flonase and Pepcid was also placed on patient's medication regimen.  Patient improved clinically but slowly during the hospitalization and will be discharged home on a slow steroid taper, scheduled nebulizer treatments.  Outpatient follow-up with PCP.    2.  Chronic combined systolic and diastolic heart failure Remained compensated during the  hospitalization. BNP was 23.6.    Maintained on home regimen and diuretics.  3.  Hypertension Continued on home regimen of Norvasc, Lasix, Cozaar, HCTZ, Toprol.   4.  Poorly controlled diabetes mellitus Hemoglobin A1c was 12.6.    Patient's home regimen of Lantus was adjusted as well as patient maintained on home regimen of meal coverage insulin as well as sliding scale insulin.  Patient CBGs were somewhat elevated during the hospitalization secondary to steroid taper however improved with decreasing steroid doses.  Outpatient follow-up with PCP.   5.  Coronary artery disease/history of CVA Stable.  Continued on home regimen of Plavix, Lipitor, beta-blocker, ARB.  6.  Chronic kidney disease stage III Stable.  7.  Depression/anxiety Continued on home regimen of BuSpar.    Procedures:  Chest x-ray 02/18/2017      Consultations:  None  Discharge Exam: Vitals:   02/26/17 1055 02/26/17 1632  BP: (!) 156/75 131/70  Pulse: 83 80  Resp: 20 20  Temp: 98 F (36.7 C) 98 F (36.7 C)  SpO2: 96% 98%    General: NAD Cardiovascular: RRR Respiratory: Minimal expiratory wheezing.  No crackles.  No rhonchi.  Discharge Instructions   Discharge Instructions    Diet Carb Modified   Complete by:  As directed    Increase activity slowly   Complete by:  As directed      Allergies as of 02/26/2017      Reactions   Keppra [levetiracetam] Anaphylaxis      Lisinopril Anaphylaxis      Medication List    STOP taking these medications   omeprazole 40 MG capsule Commonly known as:  PRILOSEC Replaced by:  pantoprazole 40 MG tablet     TAKE these medications   acetaminophen 500 MG tablet Commonly known as:  TYLENOL Take 1,000 mg by mouth every 6 (six) hours as needed for headache (pain).   albuterol 108 (90 Base) MCG/ACT inhaler Commonly known as:  PROVENTIL HFA;VENTOLIN HFA Inhale 2 puffs into the lungs every 4 (four) hours as needed for wheezing or shortness of  breath. What changed:  Another medication with the same name was changed. Make sure you understand how and when to take each.   albuterol (2.5 MG/3ML) 0.083% nebulizer solution Commonly known as:  PROVENTIL Take 3 mLs (2.5 mg total) by nebulization every 4 (four) hours as needed for wheezing or shortness of breath. For wheezing. Use 3 times daily x 3 days then ecery 4 hours as needed. What changed:    reasons to take this  additional instructions   amLODipine 10 MG tablet Commonly known as:  NORVASC Take 10 mg by mouth daily.   atorvastatin 80 MG tablet Commonly known as:  LIPITOR Take 80 mg by mouth daily.   blood glucose meter kit and supplies Dispense based on patient and insurance preference. Use up to four times daily as directed. (FOR ICD-10 E10.9, E11.9).   budesonide-formoterol 160-4.5 MCG/ACT inhaler Commonly known as:  SYMBICORT Inhale 2 puffs into the lungs 2 (two) times daily.   busPIRone 30 MG tablet Commonly known as:  BUSPAR TAKE ONE TABLET ('30mg'$ ) BY MOUTH TWICE DAILY   clopidogrel 75 MG tablet Commonly known as:  PLAVIX Take 75 mg by mouth daily.   dextromethorphan-guaiFENesin 30-600 MG 12hr tablet Commonly known as:  MUCINEX DM Take 2 tablets by mouth 2 (two) times daily  for 5 days.   diclofenac sodium 1 % Gel Commonly known as:  VOLTAREN Apply 2 g topically 2 (two) times daily as needed (pain).   famotidine 20 MG tablet Commonly known as:  PEPCID Take 20 mg by mouth 2 (two) times daily.   fluticasone 50 MCG/ACT nasal spray Commonly known as:  FLONASE Place 2 sprays into both nostrils daily. Start taking on:  02/27/2017   furosemide 20 MG tablet Commonly known as:  LASIX Take 10 mg by mouth daily.   gabapentin 300 MG capsule Commonly known as:  NEURONTIN Take 900 mg by mouth at bedtime.   insulin glargine 100 unit/mL Sopn Commonly known as:  LANTUS Inject 0.7 mLs (70 Units total) into the skin 2 (two) times daily. What changed:  how  much to take   loratadine 10 MG tablet Commonly known as:  CLARITIN Take 1 tablet (10 mg total) by mouth daily. Start taking on:  02/27/2017   losartan-hydrochlorothiazide 100-25 MG tablet Commonly known as:  HYZAAR Take 1 tablet by mouth every morning.   metoprolol succinate 25 MG 24 hr tablet Commonly known as:  TOPROL XL Take 1 tablet (25 mg total) by mouth daily.   NOVOLOG FLEXPEN 100 UNIT/ML FlexPen Generic drug:  insulin aspart Inject 30 Units into the skin 3 (three) times daily with meals.   OXYGEN Inhale 2 L into the lungs continuous.   OZEMPIC 0.25 or 0.5 MG/DOSE Sopn Generic drug:  Semaglutide Inject 0.25-0.5 mg into the skin See admin instructions. Inject 0.'25mg'$  once weekly for two weeks, then increase to 0.'5mg'$  weekly.   pantoprazole 40 MG tablet Commonly known as:  PROTONIX Take 1 tablet (40 mg total) by mouth daily. Start taking on:  02/27/2017 Replaces:  omeprazole 40 MG capsule   predniSONE 10 MG tablet Commonly known as:  DELTASONE Take 1-4 tablets (10-40 mg total) by mouth daily with breakfast. Take 4 tablets ('40mg'$ ) daily times 3 days, then 3 tablets ('30mg'$ ) daily times 3 days then 2 tablets ('20mg'$ ) daily times 3 days then 1 tablet ('10mg'$ ) daily times 3 days then stop.   Tiotropium Bromide Monohydrate 1.25 MCG/ACT Aers Inhale into the lungs daily.   trazodone 300 MG tablet Commonly known as:  DESYREL Take 300 mg by mouth at bedtime.            Durable Medical Equipment  (From admission, onward)        Start     Ordered   02/19/17 1623  For home use only DME 3 n 1  Once    Comments:  Pt wt 236lb   02/19/17 1623   02/19/17 1544  For home use only DME Tub bench  Once     02/19/17 1544   02/19/17 1544  For home use only DME 4 wheeled rolling walker with seat  Once    Question:  Patient needs a walker to treat with the following condition  Answer:  Weakness generalized   02/19/17 1544     Allergies  Allergen Reactions  . Keppra  [Levetiracetam] Anaphylaxis       . Lisinopril Anaphylaxis   Follow-up Information    Kristopher Glee., MD. Schedule an appointment as soon as possible for a visit in 1 week(s).   Specialty:  Internal Medicine Why:  f/u 2 weeks. Contact information: [address] [place] [address] [phone]            The results of significant diagnostics from this hospitalization (including  imaging, microbiology, ancillary and laboratory) are listed below for reference.    Significant Diagnostic Studies: Dg Chest 2 View  Result Date: 02/18/2017 CLINICAL DATA:  Shortness of breath and pneumonia. EXAM: CHEST  2 VIEW COMPARISON:  February 01, 2017 FINDINGS: Cardiomegaly. The hila and mediastinum are normal. No nodules or masses. No focal infiltrates. Possible tiny effusions based on the lateral view. IMPRESSION: Cardiomegaly. Probable tiny pleural effusions. No other acute abnormalities. Electronically Signed   By: Dorise Bullion III M.D   On: 02/18/2017 18:34   Dg Chest 2 View  Result Date: 02/01/2017 CLINICAL DATA:  Shortness of breath, productive cough, nausea for 1 day. History of COPD. EXAM: CHEST  2 VIEW COMPARISON:  Chest radiograph August 27, 2016 FINDINGS: Cardiac silhouette is mildly enlarged and unchanged. Calcified aortic knob. No pleural effusion or focal consolidation. Blunting of LEFT costophrenic angle. No pneumothorax. Soft tissue planes and included osseous structures are nonsuspicious. IMPRESSION: 1. Stable mild cardiomegaly. 2. Trace LEFT pleural effusion versus pleural thickening. Aortic Atherosclerosis (ICD10-I70.0). Electronically Signed   By: Elon Alas M.D.   On: 02/01/2017 22:15    Microbiology: Recent Results (from the past 240 hour(s))  Respiratory Panel by PCR     Status: Abnormal   Collection Time: 02/18/17  9:25 PM  Result Value Ref Range Status   Adenovirus NOT DETECTED NOT DETECTED Final   Coronavirus 229E NOT DETECTED NOT DETECTED  Final   Coronavirus HKU1 NOT DETECTED NOT DETECTED Final   Coronavirus NL63 NOT DETECTED NOT DETECTED Final   Coronavirus OC43 NOT DETECTED NOT DETECTED Final   Metapneumovirus NOT DETECTED NOT DETECTED Final   Rhinovirus / Enterovirus DETECTED (A) NOT DETECTED Final   Influenza A NOT DETECTED NOT DETECTED Final   Influenza B NOT DETECTED NOT DETECTED Final   Parainfluenza Virus 1 NOT DETECTED NOT DETECTED Final   Parainfluenza Virus 2 NOT DETECTED NOT DETECTED Final   Parainfluenza Virus 3 NOT DETECTED NOT DETECTED Final   Parainfluenza Virus 4 NOT DETECTED NOT DETECTED Final   Respiratory Syncytial Virus NOT DETECTED NOT DETECTED Final   Bordetella pertussis NOT DETECTED NOT DETECTED Final   Chlamydophila pneumoniae NOT DETECTED NOT DETECTED Final   Mycoplasma pneumoniae NOT DETECTED NOT DETECTED Final     Labs: Basic Metabolic Panel: Recent Labs  Lab 02/22/17 0539 02/23/17 0728 02/24/17 1030 02/25/17 0617 02/26/17 0621  NA 132* 135 139 139 136  K 3.5 3.4* 2.8* 4.0 4.3  CL 99* 102 102 105 102  CO2 '23 24 29 26 26  '$ GLUCOSE 427* 187* 74 55* 287*  BUN 26* 26* 27* 31* 37*  CREATININE 1.47* 1.31* 1.35* 1.39* 1.55*  CALCIUM 8.2* 8.9 8.6* 9.3 9.4  MG  --   --   --  2.2  --    Liver Function Tests: No results for input(s): AST, ALT, ALKPHOS, BILITOT, PROT, ALBUMIN in the last 168 hours. No results for input(s): LIPASE, AMYLASE in the last 168 hours. No results for input(s): AMMONIA in the last 168 hours. CBC: Recent Labs  Lab 02/21/17 0509 02/22/17 0539 02/23/17 0728 02/25/17 0617 02/26/17 0621  WBC 10.8* 7.6 9.7 11.5* 11.3*  HGB 10.4* 9.9* 11.2* 11.0* 10.7*  HCT 32.6* 31.8* 34.9* 35.1* 34.5*  MCV 80.9 82.6 81.0 82.2 81.2  PLT 292 264 307 288 271   Cardiac Enzymes: No results for input(s): CKTOTAL, CKMB, CKMBINDEX, TROPONINI in the last 168 hours. BNP: BNP (last 3 results) Recent Labs    08/27/16 1032 02/01/17 2137 02/18/17  1935  BNP 14.8 19.0 23.6     ProBNP (last 3 results) No results for input(s): PROBNP in the last 8760 hours.  CBG: Recent Labs  Lab 02/25/17 1648 02/25/17 2134 02/26/17 0753 02/26/17 1140 02/26/17 1650  GLUCAP 175* 302* 213* 260* 181*       Signed:  Irine Seal MD.  [facility] 02/26/2017, 5:17 PM

## 2017-02-26 NOTE — Progress Notes (Signed)
Patient refusing to ambulate for home oxygen saturation screening. Will continue to encourage.

## 2019-05-05 ENCOUNTER — Inpatient Hospital Stay (HOSPITAL_COMMUNITY)
Admission: EM | Admit: 2019-05-05 | Discharge: 2019-05-14 | DRG: 291 | Disposition: A | Discharge status: Home Health | Payer: Medicaid Other | Attending: Internal Medicine | Admitting: Internal Medicine

## 2019-05-05 ENCOUNTER — Emergency Department (HOSPITAL_COMMUNITY): Payer: Medicaid Other

## 2019-05-05 ENCOUNTER — Other Ambulatory Visit: Payer: Self-pay

## 2019-05-05 DIAGNOSIS — Z8249 Family history of ischemic heart disease and other diseases of the circulatory system: Secondary | ICD-10-CM

## 2019-05-05 DIAGNOSIS — D631 Anemia in chronic kidney disease: Secondary | ICD-10-CM | POA: Diagnosis present

## 2019-05-05 DIAGNOSIS — J449 Chronic obstructive pulmonary disease, unspecified: Secondary | ICD-10-CM | POA: Diagnosis present

## 2019-05-05 DIAGNOSIS — I10 Essential (primary) hypertension: Secondary | ICD-10-CM | POA: Diagnosis present

## 2019-05-05 DIAGNOSIS — I872 Venous insufficiency (chronic) (peripheral): Secondary | ICD-10-CM | POA: Diagnosis present

## 2019-05-05 DIAGNOSIS — Z7902 Long term (current) use of antithrombotics/antiplatelets: Secondary | ICD-10-CM

## 2019-05-05 DIAGNOSIS — E119 Type 2 diabetes mellitus without complications: Secondary | ICD-10-CM | POA: Diagnosis not present

## 2019-05-05 DIAGNOSIS — Z66 Do not resuscitate: Secondary | ICD-10-CM | POA: Diagnosis present

## 2019-05-05 DIAGNOSIS — Z9981 Dependence on supplemental oxygen: Secondary | ICD-10-CM

## 2019-05-05 DIAGNOSIS — Z87891 Personal history of nicotine dependence: Secondary | ICD-10-CM

## 2019-05-05 DIAGNOSIS — E8770 Fluid overload, unspecified: Secondary | ICD-10-CM | POA: Diagnosis not present

## 2019-05-05 DIAGNOSIS — I2510 Atherosclerotic heart disease of native coronary artery without angina pectoris: Secondary | ICD-10-CM | POA: Diagnosis not present

## 2019-05-05 DIAGNOSIS — E662 Morbid (severe) obesity with alveolar hypoventilation: Secondary | ICD-10-CM | POA: Diagnosis present

## 2019-05-05 DIAGNOSIS — D649 Anemia, unspecified: Secondary | ICD-10-CM | POA: Diagnosis not present

## 2019-05-05 DIAGNOSIS — Z888 Allergy status to other drugs, medicaments and biological substances status: Secondary | ICD-10-CM

## 2019-05-05 DIAGNOSIS — J9620 Acute and chronic respiratory failure, unspecified whether with hypoxia or hypercapnia: Secondary | ICD-10-CM | POA: Diagnosis present

## 2019-05-05 DIAGNOSIS — J9622 Acute and chronic respiratory failure with hypercapnia: Secondary | ICD-10-CM | POA: Diagnosis not present

## 2019-05-05 DIAGNOSIS — I252 Old myocardial infarction: Secondary | ICD-10-CM

## 2019-05-05 DIAGNOSIS — Z20822 Contact with and (suspected) exposure to covid-19: Secondary | ICD-10-CM | POA: Diagnosis present

## 2019-05-05 DIAGNOSIS — J9601 Acute respiratory failure with hypoxia: Secondary | ICD-10-CM | POA: Diagnosis not present

## 2019-05-05 DIAGNOSIS — E7800 Pure hypercholesterolemia, unspecified: Secondary | ICD-10-CM | POA: Diagnosis present

## 2019-05-05 DIAGNOSIS — I509 Heart failure, unspecified: Secondary | ICD-10-CM | POA: Diagnosis not present

## 2019-05-05 DIAGNOSIS — D509 Iron deficiency anemia, unspecified: Secondary | ICD-10-CM | POA: Diagnosis present

## 2019-05-05 DIAGNOSIS — I110 Hypertensive heart disease with heart failure: Secondary | ICD-10-CM | POA: Diagnosis not present

## 2019-05-05 DIAGNOSIS — I130 Hypertensive heart and chronic kidney disease with heart failure and stage 1 through stage 4 chronic kidney disease, or unspecified chronic kidney disease: Principal | ICD-10-CM | POA: Diagnosis present

## 2019-05-05 DIAGNOSIS — Z833 Family history of diabetes mellitus: Secondary | ICD-10-CM

## 2019-05-05 DIAGNOSIS — Z79899 Other long term (current) drug therapy: Secondary | ICD-10-CM | POA: Diagnosis not present

## 2019-05-05 DIAGNOSIS — J441 Chronic obstructive pulmonary disease with (acute) exacerbation: Secondary | ICD-10-CM | POA: Diagnosis present

## 2019-05-05 DIAGNOSIS — J9611 Chronic respiratory failure with hypoxia: Secondary | ICD-10-CM | POA: Diagnosis not present

## 2019-05-05 DIAGNOSIS — N183 Chronic kidney disease, stage 3 unspecified: Secondary | ICD-10-CM | POA: Diagnosis present

## 2019-05-05 DIAGNOSIS — Z7952 Long term (current) use of systemic steroids: Secondary | ICD-10-CM

## 2019-05-05 DIAGNOSIS — Z8701 Personal history of pneumonia (recurrent): Secondary | ICD-10-CM | POA: Diagnosis not present

## 2019-05-05 DIAGNOSIS — I69351 Hemiplegia and hemiparesis following cerebral infarction affecting right dominant side: Secondary | ICD-10-CM | POA: Diagnosis not present

## 2019-05-05 DIAGNOSIS — K5909 Other constipation: Secondary | ICD-10-CM | POA: Diagnosis present

## 2019-05-05 DIAGNOSIS — E1165 Type 2 diabetes mellitus with hyperglycemia: Secondary | ICD-10-CM | POA: Diagnosis present

## 2019-05-05 DIAGNOSIS — Z6841 Body Mass Index (BMI) 40.0 and over, adult: Secondary | ICD-10-CM | POA: Diagnosis not present

## 2019-05-05 DIAGNOSIS — I5023 Acute on chronic systolic (congestive) heart failure: Secondary | ICD-10-CM | POA: Diagnosis present

## 2019-05-05 DIAGNOSIS — E1122 Type 2 diabetes mellitus with diabetic chronic kidney disease: Secondary | ICD-10-CM | POA: Diagnosis present

## 2019-05-05 DIAGNOSIS — N1831 Chronic kidney disease, stage 3a: Secondary | ICD-10-CM | POA: Diagnosis present

## 2019-05-05 DIAGNOSIS — Z7951 Long term (current) use of inhaled steroids: Secondary | ICD-10-CM | POA: Diagnosis not present

## 2019-05-05 DIAGNOSIS — J9621 Acute and chronic respiratory failure with hypoxia: Secondary | ICD-10-CM | POA: Diagnosis present

## 2019-05-05 DIAGNOSIS — Z794 Long term (current) use of insulin: Secondary | ICD-10-CM | POA: Diagnosis not present

## 2019-05-05 DIAGNOSIS — R040 Epistaxis: Secondary | ICD-10-CM | POA: Diagnosis not present

## 2019-05-05 DIAGNOSIS — I5043 Acute on chronic combined systolic (congestive) and diastolic (congestive) heart failure: Secondary | ICD-10-CM | POA: Diagnosis not present

## 2019-05-05 DIAGNOSIS — Z955 Presence of coronary angioplasty implant and graft: Secondary | ICD-10-CM

## 2019-05-05 DIAGNOSIS — N1832 Chronic kidney disease, stage 3b: Secondary | ICD-10-CM | POA: Diagnosis not present

## 2019-05-05 DIAGNOSIS — J9610 Chronic respiratory failure, unspecified whether with hypoxia or hypercapnia: Secondary | ICD-10-CM | POA: Diagnosis not present

## 2019-05-05 DIAGNOSIS — E1151 Type 2 diabetes mellitus with diabetic peripheral angiopathy without gangrene: Secondary | ICD-10-CM | POA: Diagnosis not present

## 2019-05-05 LAB — POCT I-STAT 7, (LYTES, BLD GAS, ICA,H+H)
Acid-Base Excess: 3 mmol/L — ABNORMAL HIGH (ref 0.0–2.0)
Bicarbonate: 29.1 mmol/L — ABNORMAL HIGH (ref 20.0–28.0)
Calcium, Ion: 1.17 mmol/L (ref 1.15–1.40)
HCT: 28 % — ABNORMAL LOW (ref 36.0–46.0)
Hemoglobin: 9.5 g/dL — ABNORMAL LOW (ref 12.0–15.0)
O2 Saturation: 98 %
Patient temperature: 98.8
Potassium: 3.6 mmol/L (ref 3.5–5.1)
Sodium: 141 mmol/L (ref 135–145)
TCO2: 31 mmol/L (ref 22–32)
pCO2 arterial: 52 mmHg — ABNORMAL HIGH (ref 32.0–48.0)
pH, Arterial: 7.356 (ref 7.350–7.450)
pO2, Arterial: 110 mmHg — ABNORMAL HIGH (ref 83.0–108.0)

## 2019-05-05 LAB — BASIC METABOLIC PANEL
Anion gap: 13 (ref 5–15)
BUN: 22 mg/dL (ref 8–23)
CO2: 23 mmol/L (ref 22–32)
Calcium: 8.4 mg/dL — ABNORMAL LOW (ref 8.9–10.3)
Chloride: 104 mmol/L (ref 98–111)
Creatinine, Ser: 1.59 mg/dL — ABNORMAL HIGH (ref 0.44–1.00)
GFR calc Af Amer: 40 mL/min — ABNORMAL LOW (ref >60)
GFR calc non Af Amer: 34 mL/min — ABNORMAL LOW (ref >60)
Glucose, Bld: 325 mg/dL — ABNORMAL HIGH (ref 70–99)
Potassium: 3.8 mmol/L (ref 3.5–5.1)
Sodium: 140 mmol/L (ref 135–145)

## 2019-05-05 LAB — CBC WITH DIFFERENTIAL/PLATELET
Abs Immature Granulocytes: 0.02 10*3/uL (ref 0.00–0.07)
Basophils Absolute: 0 10*3/uL (ref 0.0–0.1)
Basophils Relative: 0 %
Eosinophils Absolute: 0 10*3/uL (ref 0.0–0.5)
Eosinophils Relative: 0 %
HCT: 28.8 % — ABNORMAL LOW (ref 36.0–46.0)
Hemoglobin: 8.3 g/dL — ABNORMAL LOW (ref 12.0–15.0)
Immature Granulocytes: 0 %
Lymphocytes Relative: 8 %
Lymphs Abs: 0.4 10*3/uL — ABNORMAL LOW (ref 0.7–4.0)
MCH: 25.5 pg — ABNORMAL LOW (ref 26.0–34.0)
MCHC: 28.8 g/dL — ABNORMAL LOW (ref 30.0–36.0)
MCV: 88.3 fL (ref 80.0–100.0)
Monocytes Absolute: 0.3 10*3/uL (ref 0.1–1.0)
Monocytes Relative: 6 %
Neutro Abs: 4.7 10*3/uL (ref 1.7–7.7)
Neutrophils Relative %: 86 %
Platelets: 209 10*3/uL (ref 150–400)
RBC: 3.26 MIL/uL — ABNORMAL LOW (ref 3.87–5.11)
RDW: 17.9 % — ABNORMAL HIGH (ref 11.5–15.5)
WBC: 5.5 10*3/uL (ref 4.0–10.5)
nRBC: 0 % (ref 0.0–0.2)

## 2019-05-05 LAB — GLUCOSE, CAPILLARY
Glucose-Capillary: 257 mg/dL — ABNORMAL HIGH (ref 70–99)
Glucose-Capillary: 242 mg/dL — ABNORMAL HIGH (ref 70–99)

## 2019-05-05 LAB — POC SARS CORONAVIRUS 2 AG -  ED: SARS Coronavirus 2 Ag: NEGATIVE

## 2019-05-05 LAB — BRAIN NATRIURETIC PEPTIDE: B Natriuretic Peptide: 177.1 pg/mL — ABNORMAL HIGH (ref 0.0–100.0)

## 2019-05-05 LAB — HEMOGLOBIN A1C
Hgb A1c MFr Bld: 10 % — ABNORMAL HIGH (ref 4.8–5.6)
Mean Plasma Glucose: 240.3 mg/dL

## 2019-05-05 LAB — HIV ANTIBODY (ROUTINE TESTING W REFLEX): HIV Screen 4th Generation wRfx: NONREACTIVE

## 2019-05-05 LAB — CBG MONITORING, ED
Glucose-Capillary: 284 mg/dL — ABNORMAL HIGH (ref 70–99)
Glucose-Capillary: 267 mg/dL — ABNORMAL HIGH (ref 70–99)

## 2019-05-05 MED ORDER — AZITHROMYCIN 250 MG PO TABS: 500.0000 mg | ORAL_TABLET | Freq: Every day | ORAL | Status: DC

## 2019-05-05 MED ORDER — MOMETASONE FURO-FORMOTEROL FUM 200-5 MCG/ACT IN AERO
2.0000 | INHALATION_SPRAY | Freq: Two times a day (BID) | RESPIRATORY_TRACT | Status: DC
  Administered 2019-05-05 – 2019-05-13 (×17): 2 via RESPIRATORY_TRACT
  Filled 2019-05-05: qty 8.8

## 2019-05-05 MED ORDER — DICLOFENAC SODIUM 1 % TD GEL
2.0000 g | Freq: Two times a day (BID) | TRANSDERMAL | Status: DC | PRN
  Filled 2019-05-05: qty 100

## 2019-05-05 MED ORDER — METOPROLOL SUCCINATE ER 25 MG PO TB24: 25.0000 mg | ORAL_TABLET | Freq: Every day | ORAL | Status: DC

## 2019-05-05 MED ORDER — ENOXAPARIN SODIUM 30 MG/0.3ML ~~LOC~~ SOLN
30.0000 mg | SUBCUTANEOUS | Status: DC
  Administered 2019-05-05: 30 mg via SUBCUTANEOUS
  Filled 2019-05-05: qty 0.3

## 2019-05-05 MED ORDER — ASPIRIN EC 81 MG PO TBEC
81.0000 mg | DELAYED_RELEASE_TABLET | Freq: Every day | ORAL | Status: DC
  Administered 2019-05-06 – 2019-05-14 (×9): 81 mg via ORAL
  Filled 2019-05-05 (×9): qty 1

## 2019-05-05 MED ORDER — ACETAMINOPHEN 325 MG PO TABS
650.0000 mg | ORAL_TABLET | Freq: Four times a day (QID) | ORAL | Status: DC | PRN
  Administered 2019-05-06 – 2019-05-13 (×18): 650 mg via ORAL
  Filled 2019-05-05 (×19): qty 2

## 2019-05-05 MED ORDER — PANTOPRAZOLE SODIUM 20 MG PO TBEC
20.0000 mg | DELAYED_RELEASE_TABLET | Freq: Every day | ORAL | Status: DC
  Administered 2019-05-05 – 2019-05-14 (×10): 20 mg via ORAL
  Filled 2019-05-05 (×11): qty 1

## 2019-05-05 MED ORDER — CLOPIDOGREL BISULFATE 75 MG PO TABS
75.0000 mg | ORAL_TABLET | Freq: Every day | ORAL | Status: DC
  Administered 2019-05-05 – 2019-05-14 (×10): 75 mg via ORAL
  Filled 2019-05-05 (×10): qty 1

## 2019-05-05 MED ORDER — FAMOTIDINE 20 MG PO TABS
20.0000 mg | ORAL_TABLET | Freq: Two times a day (BID) | ORAL | Status: DC
  Administered 2019-05-05 – 2019-05-14 (×18): 20 mg via ORAL
  Filled 2019-05-05 (×18): qty 1

## 2019-05-05 MED ORDER — PREDNISONE 20 MG PO TABS: 40.0000 mg | ORAL_TABLET | Freq: Every day | ORAL | Status: DC

## 2019-05-05 MED ORDER — AZITHROMYCIN 250 MG PO TABS: 250.0000 mg | ORAL_TABLET | Freq: Every day | ORAL | Status: DC

## 2019-05-05 MED ORDER — FUROSEMIDE 10 MG/ML IJ SOLN
80.0000 mg | Freq: Once | INTRAMUSCULAR | Status: AC
  Administered 2019-05-05: 80 mg via INTRAVENOUS
  Filled 2019-05-05: qty 8

## 2019-05-05 MED ORDER — METOPROLOL SUCCINATE ER 100 MG PO TB24
100.0000 mg | ORAL_TABLET | Freq: Every day | ORAL | Status: DC
  Administered 2019-05-06 – 2019-05-14 (×9): 100 mg via ORAL
  Filled 2019-05-05 (×10): qty 1

## 2019-05-05 MED ORDER — FUROSEMIDE 10 MG/ML IJ SOLN
40.0000 mg | Freq: Once | INTRAMUSCULAR | Status: AC
  Administered 2019-05-05: 40 mg via INTRAVENOUS
  Filled 2019-05-05: qty 4

## 2019-05-05 MED ORDER — IPRATROPIUM-ALBUTEROL 0.5-2.5 (3) MG/3ML IN SOLN
3.0000 mL | Freq: Four times a day (QID) | RESPIRATORY_TRACT | Status: DC
  Administered 2019-05-05: 3 mL via RESPIRATORY_TRACT
  Filled 2019-05-05 (×2): qty 3

## 2019-05-05 MED ORDER — UMECLIDINIUM BROMIDE 62.5 MCG/INH IN AEPB
1.0000 | INHALATION_SPRAY | Freq: Every day | RESPIRATORY_TRACT | Status: DC
  Administered 2019-05-06 – 2019-05-13 (×8): 1 via RESPIRATORY_TRACT
  Filled 2019-05-05: qty 7

## 2019-05-05 MED ORDER — SENNOSIDES-DOCUSATE SODIUM 8.6-50 MG PO TABS: 1.0000 | ORAL_TABLET | Freq: Every evening | ORAL | Status: DC | PRN

## 2019-05-05 MED ORDER — TRAZODONE HCL 100 MG PO TABS
100.0000 mg | ORAL_TABLET | Freq: Every evening | ORAL | Status: DC | PRN
  Administered 2019-05-05 – 2019-05-13 (×8): 100 mg via ORAL
  Filled 2019-05-05 (×9): qty 1

## 2019-05-05 MED ORDER — INSULIN ASPART 100 UNIT/ML ~~LOC~~ SOLN
0.0000 [IU] | Freq: Every day | SUBCUTANEOUS | Status: DC
  Administered 2019-05-05 – 2019-05-08 (×3): 2 [IU] via SUBCUTANEOUS
  Administered 2019-05-09: 5 [IU] via SUBCUTANEOUS
  Administered 2019-05-10: 3 [IU] via SUBCUTANEOUS

## 2019-05-05 MED ORDER — GABAPENTIN 300 MG PO CAPS
300.0000 mg | ORAL_CAPSULE | Freq: Every day | ORAL | Status: DC
  Administered 2019-05-05 – 2019-05-13 (×9): 300 mg via ORAL
  Filled 2019-05-05 (×9): qty 1

## 2019-05-05 MED ORDER — INSULIN ASPART 100 UNIT/ML ~~LOC~~ SOLN
0.0000 [IU] | Freq: Three times a day (TID) | SUBCUTANEOUS | Status: DC
  Administered 2019-05-05: 11 [IU] via SUBCUTANEOUS
  Administered 2019-05-06: 15 [IU] via SUBCUTANEOUS
  Administered 2019-05-06 (×2): 7 [IU] via SUBCUTANEOUS

## 2019-05-05 MED ORDER — TIOTROPIUM BROMIDE MONOHYDRATE 1.25 MCG/ACT IN AERS: INHALATION_SPRAY | Freq: Every day | RESPIRATORY_TRACT | Status: DC

## 2019-05-05 MED ORDER — INSULIN GLARGINE 100 UNIT/ML ~~LOC~~ SOLN
40.0000 [IU] | Freq: Two times a day (BID) | SUBCUTANEOUS | Status: DC
  Administered 2019-05-05: 40 [IU] via SUBCUTANEOUS
  Filled 2019-05-05 (×3): qty 0.4

## 2019-05-05 MED ORDER — BUSPIRONE HCL 10 MG PO TABS
15.0000 mg | ORAL_TABLET | Freq: Two times a day (BID) | ORAL | Status: DC
  Administered 2019-05-05 – 2019-05-14 (×18): 15 mg via ORAL
  Filled 2019-05-05 (×19): qty 2

## 2019-05-05 MED ORDER — ATORVASTATIN CALCIUM 80 MG PO TABS
80.0000 mg | ORAL_TABLET | Freq: Every day | ORAL | Status: DC
  Administered 2019-05-06 – 2019-05-13 (×8): 80 mg via ORAL
  Filled 2019-05-05 (×3): qty 1
  Filled 2019-05-05: qty 2
  Filled 2019-05-05 (×4): qty 1

## 2019-05-05 MED ORDER — FUROSEMIDE 10 MG/ML IJ SOLN: 80.0000 mg | Freq: Once | INTRAMUSCULAR | Status: DC

## 2019-05-05 MED ORDER — ALBUTEROL SULFATE HFA 108 (90 BASE) MCG/ACT IN AERS
8.0000 | INHALATION_SPRAY | Freq: Once | RESPIRATORY_TRACT | Status: AC
  Administered 2019-05-05: 8 via RESPIRATORY_TRACT
  Filled 2019-05-05: qty 6.7

## 2019-05-05 NOTE — ED Triage Notes (Signed)
Pt presents with SOB, has hx of COPD with recent discharge from [facility], finished five day course of medication. Began having SOB last night, used at home neb treatment with no effect. For EMS wheezing was given 10 abuterol and 0.5 atrovent with relief of wheezing. Is 100% on 4L, lives on 3. VSS for EMS: 156/86, HR 108, RR 26, has sore bottom from fall but denies other pain,  No known COVID exposures or other COVID symptoms

## 2019-05-05 NOTE — Plan of Care (Signed)

## 2019-05-05 NOTE — Progress Notes (Signed)
Patient has arrived onto the unit and is alert and oriented x 4 at admission. CCMD called and cardiac monitoring has started. Dinner tray ordered.

## 2019-05-05 NOTE — H&P (Addendum)
NAME:  Christine Sims, MRN:  [id number], DOB:  [birth year], LOS: 0 ADMISSION DATE:  05/05/2019, Primary: Kristopher Glee., MD  CHIEF COMPLAINT:  dyspnea  Medical Service: Internal Medicine Teaching Service         Attending Physician: Dr. Lucious Groves, DO    First Contact: Dr. Darrick Meigs Pager: [phone]  Second Contact: Dr. 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 She was recently hospitalized 2/7-2/16 at [facility] at which time an echo revealed an EF of 30-35%. Her ACE was discontinued due to concern of increased creatinine and CKD. She was discharged on 10 mg Lasix twice a day and was started on imdur and hydralazine.  She states she was feeling better up until about a week ago when she called her PCP for progressive leg swelling and shortness of breath at which time it was recommended she arrange an appointment to be evaluated. Over the past 2-3 days, she has developed more significant shortness of breath.  She has chronic orthopnea and sleeps in a recliner at baseline.  She has noted physical functioning at baseline and struggles with been walking to the bathroom. She denies any recent fevers, chills or diarrhea.  She denies change in cough or sputum production. She is chronically constipated.  She is typically compliant with a fluid restriction 2 to 3 cups of water per day.  No recent excessive salt intake.  Further discussion reveals a relatively poor baseline functional status over the past 2-3. She has home health to assist her with iADLs.   Past Medical  History  She,  has a past medical history of Arthritis, Asthma, CHF (congestive heart failure) ([place]), CKD (chronic kidney disease) stage 3, GFR 30-59 ml/min (HCC) (02/25/2017), COPD (chronic obstructive pulmonary disease) ([place]), Depression (02/25/2017), Diabetes mellitus without complication ([place]), HTN (hypertension) (02/25/2017), Hypercholesterolemia, Hypertension, MI (myocardial infarction) ([place]), Shortness of breath dyspnea, and Stroke ([place]).   Home Medications     Prior to Admission medications   Medication Sig Start Date End Date Taking? Authorizing Provider  acetaminophen (TYLENOL) 500 MG tablet Take 1,000 mg by mouth every 6 (six) hours as needed for headache (pain).    [provider]  albuterol (PROVENTIL HFA;VENTOLIN HFA) 108 (90 Base) MCG/ACT inhaler Inhale 2 puffs into the lungs every 4 (four) hours as needed for wheezing or shortness of breath. 04/15/15   Loletha Grayer, MD  albuterol (PROVENTIL) (2.5 MG/3ML) 0.083% nebulizer solution Take 3 mLs (2.5 mg total) by nebulization every 4 (four) hours as needed for wheezing or shortness of breath. For wheezing. Use 3 times daily x 3 days then ecery 4 hours as needed. 02/26/17 12/16/24  Eugenie Filler, MD  amLODipine (NORVASC) 10 MG tablet Take 10 mg by mouth daily.    [provider]  atorvastatin (LIPITOR) 80 MG tablet Take 80 mg by mouth daily.     [provider]  blood glucose meter kit and supplies Dispense based on patient and insurance preference. Use up to four times daily as directed. (FOR ICD-10 E10.9, E11.9). 02/26/17  Eugenie Filler, MD  budesonide-formoterol [facility]) 160-4.5 MCG/ACT inhaler Inhale 2 puffs into the lungs 2 (two) times daily. 04/15/15   Loletha Grayer, MD  busPIRone (BUSPAR) 30 MG tablet TAKE ONE TABLET ('30mg'$ ) BY MOUTH TWICE DAILY 07/07/16   [provider]  clopidogrel (PLAVIX) 75 MG tablet Take 75 mg by mouth daily.    [provider]  diclofenac sodium  (VOLTAREN) 1 % GEL Apply 2 g topically 2 (two) times daily as needed (pain).    [provider]  famotidine (PEPCID) 20 MG tablet Take 20 mg by mouth 2 (two) times daily.    [provider]  fluticasone (FLONASE) 50 MCG/ACT nasal spray Place 2 sprays into both nostrils daily. 02/27/17   Eugenie Filler, MD  furosemide (LASIX) 20 MG tablet Take 10 mg by mouth daily.    [provider]  gabapentin (NEURONTIN) 300 MG capsule Take 900 mg by mouth at bedtime.     [provider]  insulin aspart (NOVOLOG FLEXPEN) 100 UNIT/ML FlexPen Inject 30 Units into the skin 3 (three) times daily with meals.    [provider]  insulin glargine (LANTUS) 100 unit/mL SOPN Inject 0.7 mLs (70 Units total) into the skin 2 (two) times daily. 02/26/17   Eugenie Filler, MD  loratadine (CLARITIN) 10 MG tablet Take 1 tablet (10 mg total) by mouth daily. 02/27/17   Eugenie Filler, MD  losartan-hydrochlorothiazide (HYZAAR) 100-25 MG per tablet Take 1 tablet by mouth every morning.    [provider]  metoprolol succinate (TOPROL XL) 25 MG 24 hr tablet Take 1 tablet (25 mg total) by mouth daily. 04/15/15   Loletha Grayer, MD  OXYGEN Inhale 2 L into the lungs continuous.    [provider]  pantoprazole (PROTONIX) 40 MG tablet Take 1 tablet (40 mg total) by mouth daily. 02/27/17   Eugenie Filler, MD  predniSONE (DELTASONE) 10 MG tablet Take 1-4 tablets (10-40 mg total) by mouth daily with breakfast. Take 4 tablets ('40mg'$ ) daily times 3 days, then 3 tablets ('30mg'$ ) daily times 3 days then 2 tablets ('20mg'$ ) daily times 3 days then 1 tablet ('10mg'$ ) daily times 3 days then stop. 02/26/17   Eugenie Filler, MD  Semaglutide (OZEMPIC) 0.25 or 0.5 MG/DOSE SOPN Inject 0.25-0.5 mg into the skin See admin instructions. Inject 0.'25mg'$  once weekly for two weeks, then increase to 0.'5mg'$  weekly.    [provider]  Tiotropium Bromide Monohydrate 1.25 MCG/ACT AERS  Inhale into the lungs daily.    [provider]  trazodone (DESYREL) 300 MG tablet Take 300 mg by mouth at bedtime.    [provider]    Allergies    Allergies as of 05/05/2019 - Review Complete 05/05/2019  Allergen Reaction Noted  . Keppra [levetiracetam] Anaphylaxis 11/03/2013  . Lisinopril Anaphylaxis 11/03/2013    Social History  ~75 pack year smoking history. Remote history of alcohol and illicit drug use  Family History   Her family history includes Diabetes in an other family member; Hypertension in an other family member.   ROS  Review of Systems  Constitutional: Negative for chills and fever.  HENT: Negative.   Respiratory: Positive for cough, shortness of breath and wheezing. Negative for sputum production.   Cardiovascular: Positive for chest pain, orthopnea, leg swelling and PND.  Gastrointestinal: Positive for constipation. Negative for abdominal pain, diarrhea, nausea and vomiting.  Genitourinary: Negative.   Musculoskeletal: Negative.   Skin: Negative.   Neurological: Negative.  Psychiatric/Behavioral: Negative.     Objective   Blood pressure (!) 146/72, pulse (!) 108, temperature 98.8 F (37.1 C), temperature source Oral, resp. rate 18, SpO2 99 %.    There were no vitals filed for this visit.  Examination: GENERAL: Chronically ill-appearing in moderate respiratory distress HEENT: Airway patent. CARDIAC: heart RRR.  JVD difficult to assess.  Diffuse anasarca lower extremity edema extending up into her abdomen PULMONARY: Uses accessory muscles for breathing.  Poor air movement bilaterally with no appreciable rhonchi ABDOMEN: Obese.  Bowel sounds active NEURO: Alert and oriented CN II-XII grossly intact. SKIN: Lower extremity skin findings consistent with stasis dermatitis.  Some mild weeping lower extremities as well PSYCH: Normal affect  Significant Diagnostic Tests:  EKG personally reviewed and appears to be sinus tachycardia with  repull abnormalities in several different leads.  CXR: Diffuse hazy opacities bilaterally.  Cardiomegaly present  Labs    CBC Latest Ref Rng & Units 05/05/2019 05/05/2019 02/26/2017  WBC 4.0 - 10.5 K/uL - 5.5 11.3(H)  Hemoglobin 12.0 - 15.0 g/dL 9.5(L) 8.3(L) 10.7(L)  Hematocrit 36.0 - 46.0 % 28.0(L) 28.8(L) 34.5(L)  Platelets 150 - 400 K/uL - 209 271   BMP Latest Ref Rng & Units 05/05/2019 05/05/2019 02/26/2017  Glucose 70 - 99 mg/dL - 325(H) 287(H)  BUN 8 - 23 mg/dL - 22 37(H)  Creatinine 0.44 - 1.00 mg/dL - 1.59(H) 1.55(H)  Sodium 135 - 145 mmol/L 141 140 136  Potassium 3.5 - 5.1 mmol/L 3.6 3.8 4.3  Chloride 98 - 111 mmol/L - 104 102  CO2 22 - 32 mmol/L - 23 26  Calcium 8.9 - 10.3 mg/dL - 8.4(L) 9.4   BMP: 177  Summary  This is a 64 year old chronically ill female who has 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 Requires 2L supplemental O2 at baseline DDX includes CHF exacerbation vs COPD exacerbation. Required 4L in ER which was successfully decreased to 2L while we were in the room. -Suspect the respiratory failure is primarily attributable to heart failure with her lower extremity edema and significant heart failure.  BNP is mildly elevated (and falsely lowered by BMI/CKD) suggesting heart strain. -She denies a change in cough or increased sputum production to suggest COPD exacerbation. She does have audible wheezes however this can be seen with heart failure exacerbations as well. Additionally she has no leukocytosis to suggest infectious source. -She also likely has a component of OHS is likely to have sleep apnea.  Will need outpatient sleep study on discharge as untreated sleep apnea is not helpful with her heart condition. Plan Diuresis: 40 mg Lasix now.  We will give 80 mg later tonight. Please weigh the patient upon arrival on the floor. Strict I/O Daily weights Telemetry monitoring Fluid restriction 1200 mL a day May benefit from some time on the BiPAP if respiratory status declines  Chronic COPD with chronic respiratory failure Home medications: Albuterol inhaler, Symbicort 160-4.5 mcg, Tiotropium 1.25 mcg Difficult to delineate if there is an component of COPD exacerbation involved in her symptoms however I will hold off on treating her for an exacerbation at this point.  If her symptoms worsen her respiratory status declines, then  I would consider starting treating her for COPD exacerbation.   Plan: Duo nebs every 6 hours, Dulera, tiotropium  Chronic hypertension.  History of CVA Home medications: Amlodipine 10 mg daily, Hyzaar 100-25 mg daily, metoprolol 25 mg daily Per chart review, appears she was started on hydralazine and imdur at her last hospitalization. Plan: Continue home dose of metoprolol 25 mg.  Continue Plavix  Type 2 diabetes Home medications Ozempic Plan: Sliding scale insulin with 40 mg Lantus twice daily  CKD III. Renal function appears stable in comparison to recent [facility] admission  Anemia of chronic disease. Iron panel obtained on last hospitalization with consistent iron studies.    Best practice:  CODE STATUS: DNR Diet: Cardiac, diabetic, fluid restriction 1200 mL daily GI prophylaxis: Pepcid and Protonix DVT for prophylaxis: Lovenox Dispo: Admit patient to Inpatient with expected length of stay greater than 2 midnights.   Mitzi Hansen, MD INTERNAL MEDICINE RESIDENT PGY-1 Pager # [phone] 05/05/19 4:17 PM

## 2019-05-05 NOTE — Plan of Care (Signed)
  Problem: Education: Goal: Knowledge of General Education information will improve Description Including pain rating scale, medication(s)/side effects and non-pharmacologic comfort measures Outcome: Progressing   

## 2019-05-05 NOTE — ED Provider Notes (Signed)
[facility] EMERGENCY DEPARTMENT Provider Note   CSN: [id number] Arrival date & time: 05/05/19  1030     History Chief Complaint  Patient presents with  . Shortness of Breath    Christine Sims is a 64 y.o. female with a past medical history of morbid obesity, chronic kidney disease, CHF, COPD with hypoxic respiratory failure on chronic oxygen supplementation with 2 L, history of stroke with mild right-sided weakness, previous MI, hypertension, hypercholesterolemia and insulin-dependent diabetes.  Patient presents with severe shortness of breath.  Patient was admitted to [facility]) at the beginning of the month.  She states that she was told "my heart is weak."  Review of EMR shows that the patient called her PCP Dr. Karle Starch about a week ago complaining of increasing swelling in her lower extremities.  She states that she has severe exertional dyspnea which is unusual for her she states that she is unable to get to the bathroom and back because she has such severe shortness of breath.  She has noticed increased tight swelling in her lower extremities and some increased girth in her lower abdomen.  Patient states that this feels like her COPD.  She has orthopnea.  She appears to be only taking about 10 mg of Lasix daily.  She has chest pain but she states this is due to the exertion of her breathing.  She denies fevers, chills, Covid exposure.  She was recently tested for Covid last week.  HPI     Past Medical History:  Diagnosis Date  . Arthritis   . Asthma   . CHF (congestive heart failure) ([place])   . CKD (chronic kidney disease) stage 3, GFR 30-59 ml/min (HCC) 02/25/2017  . COPD (chronic obstructive pulmonary disease) ([place])   . Depression 02/25/2017  . Diabetes mellitus without complication ([place])   . HTN (hypertension) 02/25/2017  . Hypercholesterolemia   . Hypertension   . MI (myocardial infarction) ([place])   . Shortness of breath dyspnea   .  Stroke [facility])    R sided weakness    Patient Active Problem List   Diagnosis Date Noted  . COPD with acute exacerbation ([place]) 02/25/2017  . Rhinovirus infection 02/25/2017  . HTN (hypertension) 02/25/2017  . Chronic combined systolic and diastolic heart failure ([place]) 02/25/2017  . CKD (chronic kidney disease) stage 3, GFR 30-59 ml/min 02/25/2017  . Diabetes mellitus, insulin dependent (IDDM), controlled 02/25/2017  . Depression 02/25/2017  . COPD (chronic obstructive pulmonary disease) ([place]) 02/18/2017  . Community acquired pneumonia 04/04/2015    Past Surgical History:  Procedure Laterality Date  . CARDIAC SURGERY    . FOOT SURGERY     bilateral     OB History    Gravida      Para      Term      Preterm      AB      Living  2     SAB      TAB      Ectopic      Multiple      Live Births              Family History  Problem Relation Age of Onset  . Diabetes Other   . Hypertension Other     Social History   Tobacco Use  . Smoking status: Former Smoker    Types: Cigarettes  . Smokeless tobacco: Never Used  Substance Use Topics  . Alcohol use:  Yes  . Drug use: No    Home Medications Prior to Admission medications   Medication Sig Start Date End Date Taking? Authorizing Provider  acetaminophen (TYLENOL) 500 MG tablet Take 1,000 mg by mouth every 6 (six) hours as needed for headache (pain).    [provider]  albuterol (PROVENTIL HFA;VENTOLIN HFA) 108 (90 Base) MCG/ACT inhaler Inhale 2 puffs into the lungs every 4 (four) hours as needed for wheezing or shortness of breath. 04/15/15   Loletha Grayer, MD  albuterol (PROVENTIL) (2.5 MG/3ML) 0.083% nebulizer solution Take 3 mLs (2.5 mg total) by nebulization every 4 (four) hours as needed for wheezing or shortness of breath. For wheezing. Use 3 times daily x 3 days then ecery 4 hours as needed. 02/26/17 12/16/24  Eugenie Filler, MD  amLODipine (NORVASC) 10 MG tablet Take 10 mg by mouth  daily.    [provider]  atorvastatin (LIPITOR) 80 MG tablet Take 80 mg by mouth daily.     [provider]  blood glucose meter kit and supplies Dispense based on patient and insurance preference. Use up to four times daily as directed. (FOR ICD-10 E10.9, E11.9). 02/26/17   Eugenie Filler, MD  budesonide-formoterol [facility]) 160-4.5 MCG/ACT inhaler Inhale 2 puffs into the lungs 2 (two) times daily. 04/15/15   Loletha Grayer, MD  busPIRone (BUSPAR) 30 MG tablet TAKE ONE TABLET ('30mg'$ ) BY MOUTH TWICE DAILY 07/07/16   [provider]  clopidogrel (PLAVIX) 75 MG tablet Take 75 mg by mouth daily.    [provider]  diclofenac sodium (VOLTAREN) 1 % GEL Apply 2 g topically 2 (two) times daily as needed (pain).    [provider]  famotidine (PEPCID) 20 MG tablet Take 20 mg by mouth 2 (two) times daily.    [provider]  fluticasone (FLONASE) 50 MCG/ACT nasal spray Place 2 sprays into both nostrils daily. 02/27/17   Eugenie Filler, MD  furosemide (LASIX) 20 MG tablet Take 10 mg by mouth daily.    [provider]  gabapentin (NEURONTIN) 300 MG capsule Take 900 mg by mouth at bedtime.     [provider]  insulin aspart (NOVOLOG FLEXPEN) 100 UNIT/ML FlexPen Inject 30 Units into the skin 3 (three) times daily with meals.    [provider]  insulin glargine (LANTUS) 100 unit/mL SOPN Inject 0.7 mLs (70 Units total) into the skin 2 (two) times daily. 02/26/17   Eugenie Filler, MD  loratadine (CLARITIN) 10 MG tablet Take 1 tablet (10 mg total) by mouth daily. 02/27/17   Eugenie Filler, MD  losartan-hydrochlorothiazide (HYZAAR) 100-25 MG per tablet Take 1 tablet by mouth every morning.    [provider]  metoprolol succinate (TOPROL XL) 25 MG 24 hr tablet Take 1 tablet (25 mg total) by mouth daily. 04/15/15   Loletha Grayer, MD  OXYGEN Inhale 2 L into the lungs continuous.    [provider]    pantoprazole (PROTONIX) 40 MG tablet Take 1 tablet (40 mg total) by mouth daily. 02/27/17   Eugenie Filler, MD  predniSONE (DELTASONE) 10 MG tablet Take 1-4 tablets (10-40 mg total) by mouth daily with breakfast. Take 4 tablets ('40mg'$ ) daily times 3 days, then 3 tablets ('30mg'$ ) daily times 3 days then 2 tablets ('20mg'$ ) daily times 3 days then 1 tablet ('10mg'$ ) daily times 3 days then stop. 02/26/17   Eugenie Filler, MD  Semaglutide (OZEMPIC) 0.25 or 0.5 MG/DOSE SOPN Inject 0.25-0.5 mg into  the skin See admin instructions. Inject 0.'25mg'$  once weekly for two weeks, then increase to 0.'5mg'$  weekly.    [provider]  Tiotropium Bromide Monohydrate 1.25 MCG/ACT AERS Inhale into the lungs daily.    [provider]  trazodone (DESYREL) 300 MG tablet Take 300 mg by mouth at bedtime.    [provider]    Allergies    Keppra [levetiracetam] and Lisinopril  Review of Systems   Review of Systems Ten systems reviewed and are negative for acute change, except as noted in the HPI.   Physical Exam Updated Vital Signs BP (!) 135/110 (BP Location: Right Arm)   Pulse (!) 119   Temp 98.8 F (37.1 C) (Oral)   Resp 20   SpO2 100%   Physical Exam Vitals and nursing note reviewed.  Constitutional:      General: She is in acute distress.     Appearance: She is well-developed. She is obese. She is not ill-appearing or diaphoretic.  HENT:     Head: Normocephalic and atraumatic.  Eyes:     General: No scleral icterus.    Conjunctiva/sclera: Conjunctivae normal.  Cardiovascular:     Rate and Rhythm: Normal rate and regular rhythm.     Heart sounds: Normal heart sounds. No murmur. No friction rub. No gallop.   Pulmonary:     Effort: Tachypnea, accessory muscle usage, prolonged expiration and respiratory distress (moderate) present.     Breath sounds: Decreased air movement present. Examination of the right-upper field reveals wheezing. Examination of the left-upper field  reveals wheezing. Examination of the right-middle field reveals wheezing. Examination of the left-middle field reveals wheezing. Examination of the right-lower field reveals wheezing. Examination of the left-lower field reveals wheezing. Wheezing present.  Abdominal:     General: Bowel sounds are normal. There is no distension.     Palpations: Abdomen is soft. There is no mass.     Tenderness: There is no abdominal tenderness. There is no guarding.  Musculoskeletal:     Cervical back: Normal range of motion.     Right lower leg: Edema present.     Left lower leg: Edema present.     Comments: Tense, pitting edema of the lower extremities extending up to the umbilicus.  Skin:    General: Skin is warm and dry.  Neurological:     Mental Status: She is alert and oriented to person, place, and time.  Psychiatric:        Behavior: Behavior normal.     ED Results / Procedures / Treatments   Labs (all labs ordered are listed, but only abnormal results are displayed) Labs Reviewed  BASIC METABOLIC PANEL  CBC WITH DIFFERENTIAL/PLATELET  BRAIN NATRIURETIC PEPTIDE  POC SARS CORONAVIRUS 2 AG -  ED  CBG MONITORING, ED    EKG EKG Interpretation  Date/Time:  Monday May 05 2019 10:38:44 EST Ventricular Rate:  118 PR Interval:    QRS Duration: 94 QT Interval:  331 QTC Calculation: 464 R Axis:   18 Text Interpretation: Sinus tachycardia Nonspecific repol abnormality, diffuse leads Confirmed by Virgel Manifold [phone]) on 05/05/2019 10:42:40 AM Also confirmed by Virgel Manifold [phone]), editor Hattie Perch ([id number])  on 05/05/2019 11:56:08 AM   Radiology DG Chest Port 1 View  Result Date: 05/05/2019 CLINICAL DATA:  Shortness of breath EXAM: PORTABLE CHEST 1 VIEW COMPARISON:  04/13/2019 FINDINGS: Heart size is moderately enlarged, unchanged. Calcific aortic knob. Persistent streaky bibasilar airspace opacities. No large pleural fluid collection. No pneumothorax. IMPRESSION:  Persistent streaky  bibasilar airspace opacities, which may reflect atelectasis versus pneumonia. Stable cardiomegaly. Electronically Signed   By: Davina Poke D.O.   On: 05/05/2019 11:47    Procedures .Critical Care Performed by: Margarita Mail, PA-C Authorized by: Margarita Mail, PA-C   Critical care provider statement:    Critical care time (minutes):  48   Critical care time was exclusive of:  Separately billable procedures and treating other patients   Critical care was necessary to treat or prevent imminent or life-threatening deterioration of the following conditions:  Respiratory failure   Critical care was time spent personally by me on the following activities:  Discussions with consultants, evaluation of patient's response to treatment, examination of patient, ordering and performing treatments and interventions, ordering and review of laboratory studies, ordering and review of radiographic studies, pulse oximetry, re-evaluation of patient's condition, obtaining history from patient or surrogate and review of old charts   (including critical care time)  Medications Ordered in ED Medications  furosemide (LASIX) injection 40 mg (has no administration in time range)  albuterol (VENTOLIN HFA) 108 (90 Base) MCG/ACT inhaler 8 puff (has no administration in time range)    ED Course  I have reviewed the triage vital signs and the nursing notes.  Pertinent labs & imaging results that were available during my care of the patient were reviewed by me and considered in my medical decision making (see chart for details).    MDM Rules/Calculators/A&P                      CC:SOB VS:  Vitals:   05/05/19 1200 05/05/19 1215 05/05/19 1230 05/05/19 1245  BP:      Pulse:      Resp:      Temp:      TempSrc:      SpO2: 100% 98% 100% 100%    QI:ONGEXBM is gathered by patient and outside records and review of EMR. DDX:The emergent differential diagnosis for shortness of breath includes, but is not  limited to, Pulmonary edema, bronchoconstriction, Pneumonia, Pulmonary embolism, Pneumotherax/ Hemothorax, Dysrythmia, ACS.  Labs: I reviewed the labs which show elevated BNP beyond baseline, BMP shows glucose of 325, chronic renal insufficiency unchanged, point-of-care Covid test is negative and 6 to 24-hour PCR is pending.  Patient has a chronic respiratory acidosis which is compensated, CBC shows hemoglobin of 8 with no recent to compare Imaging: I personally reviewed the images (1 V cxr) which show(s) BL Edema on my interpretation EKG: Sinus tachycardia at a rate of 118 with early repole.  (Taken just after albuterol treatment) MDM: Patient here with acute on chronic hypoxic respiratory failure.  On examination she appears to have a mixed COPD and CHF exacerbation with pitting edema up to her umbilicus, wheezing improved with albuterol.  She is afebrile, persistently tachycardic and tachypneic with decreased air movement also likely inhibited by her body habitus and obesity.  Patient will be admitted by the internal medicine service.  Her breathing status has improved.  She may still benefit from BiPAP. Patient disposition: Admit Patient condition: Serious. The patient appears reasonably stabilized for admission considering the current resources, flow, and capabilities available in the ED at this time, and I doubt any other [facility] requiring further screening and/or treatment in the ED prior to admission.  Final Clinical Impression(s) / ED Diagnoses Final diagnoses:  Acute on chronic congestive heart failure, unspecified heart failure type ([place])  Acute on chronic respiratory failure, unspecified whether with  hypoxia or hypercapnia [facility]    Rx / DC Orders ED Discharge Orders    None       Margarita Mail, PA-C 05/05/19 1614    Virgel Manifold, MD 05/11/19 [phone]

## 2019-05-06 ENCOUNTER — Encounter (HOSPITAL_COMMUNITY): Payer: Self-pay | Admitting: Internal Medicine

## 2019-05-06 DIAGNOSIS — Z8673 Personal history of transient ischemic attack (TIA), and cerebral infarction without residual deficits: Secondary | ICD-10-CM

## 2019-05-06 DIAGNOSIS — N183 Chronic kidney disease, stage 3 unspecified: Secondary | ICD-10-CM

## 2019-05-06 DIAGNOSIS — Z8701 Personal history of pneumonia (recurrent): Secondary | ICD-10-CM

## 2019-05-06 DIAGNOSIS — M1990 Unspecified osteoarthritis, unspecified site: Secondary | ICD-10-CM

## 2019-05-06 DIAGNOSIS — I5023 Acute on chronic systolic (congestive) heart failure: Secondary | ICD-10-CM

## 2019-05-06 DIAGNOSIS — J9621 Acute and chronic respiratory failure with hypoxia: Secondary | ICD-10-CM

## 2019-05-06 DIAGNOSIS — D631 Anemia in chronic kidney disease: Secondary | ICD-10-CM

## 2019-05-06 DIAGNOSIS — E1122 Type 2 diabetes mellitus with diabetic chronic kidney disease: Secondary | ICD-10-CM

## 2019-05-06 DIAGNOSIS — I130 Hypertensive heart and chronic kidney disease with heart failure and stage 1 through stage 4 chronic kidney disease, or unspecified chronic kidney disease: Principal | ICD-10-CM

## 2019-05-06 DIAGNOSIS — Z66 Do not resuscitate: Secondary | ICD-10-CM

## 2019-05-06 DIAGNOSIS — J449 Chronic obstructive pulmonary disease, unspecified: Secondary | ICD-10-CM

## 2019-05-06 DIAGNOSIS — Z9981 Dependence on supplemental oxygen: Secondary | ICD-10-CM

## 2019-05-06 DIAGNOSIS — E7800 Pure hypercholesterolemia, unspecified: Secondary | ICD-10-CM

## 2019-05-06 DIAGNOSIS — K5909 Other constipation: Secondary | ICD-10-CM

## 2019-05-06 DIAGNOSIS — Z79899 Other long term (current) drug therapy: Secondary | ICD-10-CM

## 2019-05-06 DIAGNOSIS — Z794 Long term (current) use of insulin: Secondary | ICD-10-CM

## 2019-05-06 DIAGNOSIS — E1151 Type 2 diabetes mellitus with diabetic peripheral angiopathy without gangrene: Secondary | ICD-10-CM

## 2019-05-06 DIAGNOSIS — Z87891 Personal history of nicotine dependence: Secondary | ICD-10-CM

## 2019-05-06 DIAGNOSIS — Z888 Allergy status to other drugs, medicaments and biological substances status: Secondary | ICD-10-CM

## 2019-05-06 DIAGNOSIS — Z7951 Long term (current) use of inhaled steroids: Secondary | ICD-10-CM

## 2019-05-06 DIAGNOSIS — E1165 Type 2 diabetes mellitus with hyperglycemia: Secondary | ICD-10-CM

## 2019-05-06 DIAGNOSIS — I252 Old myocardial infarction: Secondary | ICD-10-CM

## 2019-05-06 LAB — COMPREHENSIVE METABOLIC PANEL
ALT: 20 U/L (ref 0–44)
AST: 13 U/L — ABNORMAL LOW (ref 15–41)
Albumin: 2.5 g/dL — ABNORMAL LOW (ref 3.5–5.0)
Alkaline Phosphatase: 65 U/L (ref 38–126)
Anion gap: 10 (ref 5–15)
BUN: 21 mg/dL (ref 8–23)
CO2: 27 mmol/L (ref 22–32)
Calcium: 8.3 mg/dL — ABNORMAL LOW (ref 8.9–10.3)
Chloride: 103 mmol/L (ref 98–111)
Creatinine, Ser: 1.6 mg/dL — ABNORMAL HIGH (ref 0.44–1.00)
GFR calc Af Amer: 40 mL/min — ABNORMAL LOW (ref >60)
GFR calc non Af Amer: 34 mL/min — ABNORMAL LOW (ref >60)
Glucose, Bld: 226 mg/dL — ABNORMAL HIGH (ref 70–99)
Potassium: 3.8 mmol/L (ref 3.5–5.1)
Sodium: 140 mmol/L (ref 135–145)
Total Bilirubin: 0.7 mg/dL (ref 0.3–1.2)
Total Protein: 5.6 g/dL — ABNORMAL LOW (ref 6.5–8.1)

## 2019-05-06 LAB — CBC WITH DIFFERENTIAL/PLATELET
Abs Immature Granulocytes: 0.02 10*3/uL (ref 0.00–0.07)
Basophils Absolute: 0 10*3/uL (ref 0.0–0.1)
Basophils Relative: 0 %
Eosinophils Absolute: 0.1 10*3/uL (ref 0.0–0.5)
Eosinophils Relative: 1 %
HCT: 26.2 % — ABNORMAL LOW (ref 36.0–46.0)
Hemoglobin: 7.9 g/dL — ABNORMAL LOW (ref 12.0–15.0)
Immature Granulocytes: 0 %
Lymphocytes Relative: 17 %
Lymphs Abs: 0.8 10*3/uL (ref 0.7–4.0)
MCH: 25.9 pg — ABNORMAL LOW (ref 26.0–34.0)
MCHC: 30.2 g/dL (ref 30.0–36.0)
MCV: 85.9 fL (ref 80.0–100.0)
Monocytes Absolute: 0.4 10*3/uL (ref 0.1–1.0)
Monocytes Relative: 8 %
Neutro Abs: 3.5 10*3/uL (ref 1.7–7.7)
Neutrophils Relative %: 74 %
Platelets: 211 10*3/uL (ref 150–400)
RBC: 3.05 MIL/uL — ABNORMAL LOW (ref 3.87–5.11)
RDW: 18 % — ABNORMAL HIGH (ref 11.5–15.5)
WBC: 4.8 10*3/uL (ref 4.0–10.5)
nRBC: 0 % (ref 0.0–0.2)

## 2019-05-06 LAB — PHOSPHORUS: Phosphorus: 4.5 mg/dL (ref 2.5–4.6)

## 2019-05-06 LAB — GLUCOSE, CAPILLARY
Glucose-Capillary: 215 mg/dL — ABNORMAL HIGH (ref 70–99)
Glucose-Capillary: 326 mg/dL — ABNORMAL HIGH (ref 70–99)
Glucose-Capillary: 245 mg/dL — ABNORMAL HIGH (ref 70–99)
Glucose-Capillary: 87 mg/dL (ref 70–99)

## 2019-05-06 LAB — MAGNESIUM: Magnesium: 1.9 mg/dL (ref 1.7–2.4)

## 2019-05-06 MED ORDER — TIZANIDINE HCL 4 MG PO TABS
2.0000 mg | ORAL_TABLET | Freq: Every day | ORAL | Status: DC | PRN
  Administered 2019-05-06 – 2019-05-13 (×7): 2 mg via ORAL
  Filled 2019-05-06 (×7): qty 1

## 2019-05-06 MED ORDER — FUROSEMIDE 10 MG/ML IJ SOLN
80.0000 mg | Freq: Two times a day (BID) | INTRAMUSCULAR | Status: DC
  Administered 2019-05-06: 80 mg via INTRAVENOUS
  Filled 2019-05-06: qty 8

## 2019-05-06 MED ORDER — INSULIN GLARGINE 100 UNIT/ML ~~LOC~~ SOLN
50.0000 [IU] | Freq: Two times a day (BID) | SUBCUTANEOUS | Status: DC
  Administered 2019-05-06 – 2019-05-13 (×15): 50 [IU] via SUBCUTANEOUS
  Filled 2019-05-06 (×16): qty 0.5

## 2019-05-06 MED ORDER — ENOXAPARIN SODIUM 60 MG/0.6ML ~~LOC~~ SOLN
60.0000 mg | SUBCUTANEOUS | Status: DC
  Administered 2019-05-06 – 2019-05-13 (×7): 60 mg via SUBCUTANEOUS
  Filled 2019-05-06 (×7): qty 0.6

## 2019-05-06 MED ORDER — PREDNISONE 20 MG PO TABS
40.0000 mg | ORAL_TABLET | Freq: Every day | ORAL | Status: DC
  Filled 2019-05-06: qty 2

## 2019-05-06 MED ORDER — IPRATROPIUM-ALBUTEROL 0.5-2.5 (3) MG/3ML IN SOLN
3.0000 mL | RESPIRATORY_TRACT | Status: DC | PRN
  Administered 2019-05-06 – 2019-05-09 (×3): 3 mL via RESPIRATORY_TRACT
  Filled 2019-05-06 (×3): qty 3

## 2019-05-06 MED ORDER — IPRATROPIUM-ALBUTEROL 0.5-2.5 (3) MG/3ML IN SOLN
3.0000 mL | Freq: Four times a day (QID) | RESPIRATORY_TRACT | Status: DC
  Administered 2019-05-06 – 2019-05-08 (×9): 3 mL via RESPIRATORY_TRACT
  Filled 2019-05-06 (×9): qty 3

## 2019-05-06 MED ORDER — AMITRIPTYLINE HCL 25 MG PO TABS
25.0000 mg | ORAL_TABLET | Freq: Every day | ORAL | Status: DC
  Administered 2019-05-06 – 2019-05-13 (×8): 25 mg via ORAL
  Filled 2019-05-06 (×10): qty 1

## 2019-05-06 MED ORDER — ISOSORBIDE MONONITRATE ER 30 MG PO TB24
30.0000 mg | ORAL_TABLET | Freq: Every day | ORAL | Status: DC
  Administered 2019-05-06 – 2019-05-14 (×9): 30 mg via ORAL
  Filled 2019-05-06 (×9): qty 1

## 2019-05-06 MED ORDER — FUROSEMIDE 10 MG/ML IJ SOLN
120.0000 mg | Freq: Two times a day (BID) | INTRAVENOUS | Status: DC
  Filled 2019-05-06 (×2): qty 12

## 2019-05-06 MED ORDER — SPIRONOLACTONE 12.5 MG HALF TABLET
12.5000 mg | ORAL_TABLET | Freq: Every day | ORAL | Status: DC
  Administered 2019-05-06 – 2019-05-07 (×2): 12.5 mg via ORAL
  Filled 2019-05-06 (×2): qty 1

## 2019-05-06 MED ORDER — FUROSEMIDE 10 MG/ML IJ SOLN
120.0000 mg | Freq: Two times a day (BID) | INTRAVENOUS | Status: DC
  Administered 2019-05-06 – 2019-05-07 (×2): 120 mg via INTRAVENOUS
  Filled 2019-05-06: qty 10
  Filled 2019-05-06: qty 12
  Filled 2019-05-06: qty 10

## 2019-05-06 MED ORDER — INSULIN ASPART 100 UNIT/ML ~~LOC~~ SOLN
8.0000 [IU] | Freq: Three times a day (TID) | SUBCUTANEOUS | Status: DC
  Administered 2019-05-06: 8 [IU] via SUBCUTANEOUS

## 2019-05-06 MED ORDER — FUROSEMIDE 10 MG/ML IJ SOLN: 120.0000 mg | Freq: Two times a day (BID) | INTRAVENOUS | Status: DC

## 2019-05-06 NOTE — Plan of Care (Signed)
  Problem: Education: Goal: Knowledge of General Education information will improve Description Including pain rating scale, medication(s)/side effects and non-pharmacologic comfort measures Outcome: Progressing   

## 2019-05-06 NOTE — Discharge Instructions (Signed)
Hemoglobin A1c Test Why am I having this test? You may have the hemoglobin A1c test (HbA1c test) done to:  Evaluate your risk for developing diabetes (diabetes mellitus).  Diagnose diabetes.  Monitor long-term control of blood sugar (glucose) in people who have diabetes and help make treatment decisions. This test may be done with other blood glucose tests, such as fasting blood glucose and oral glucose tolerance tests. What is being tested? Hemoglobin is a type of protein in the blood that carries oxygen. Glucose attaches to hemoglobin to form glycated hemoglobin. This test checks the amount of glycated hemoglobin in your blood, which is a good indicator of the average amount of glucose in your blood during the past 2-3 months. What kind of sample is taken?  A blood sample is required for this test. It is usually collected by inserting a needle into a blood vessel. Tell a health care provider about:  All medicines you are taking, including vitamins, herbs, eye drops, creams, and over-the-counter medicines.  Any blood disorders you have.  Any surgeries you have had.  Any medical conditions you have.  Whether you are pregnant or may be pregnant. How are the results reported? Your results will be reported as a percentage that indicates how much of your hemoglobin has glucose attached to it (is glycated). Your health care provider will compare your results to normal ranges that were established after testing a large group of people (reference ranges). Reference ranges may vary among labs and hospitals. For this test, common reference ranges are:  Adult or child without diabetes: 4-5.6%.  Adult or child with diabetes and good blood glucose control: less than 7%. What do the results mean? If you have diabetes:  A result of less than 7% is considered normal, meaning that your blood glucose is well controlled.  A result higher than 7% means that your blood glucose is not well  controlled, and your treatment plan may need to be adjusted. If you do not have diabetes:  A result within the reference range is considered normal, meaning that you are not at high risk for diabetes.  A result of 5.7-6.4% means that you have a high risk of developing diabetes, and you may have prediabetes. Prediabetes is the condition of having a blood glucose level that is higher than it should be, but not high enough for you to be diagnosed with diabetes. Having prediabetes puts you at risk for developing type 2 diabetes (type 2 diabetes mellitus). You may have more tests, including a repeat HbA1c test.  Results of 6.5% or higher on two separate HbA1c tests mean that you have diabetes. You may have more tests to confirm the diagnosis. Abnormally low HbA1c values may be caused by:  Pregnancy.  Severe blood loss.  Receiving donated blood (transfusions).  Low red blood cell count (anemia).  Long-term kidney failure.  Some unusual forms (variants) of hemoglobin. Talk with your health care provider about what your results mean. Questions to ask your health care provider Ask your health care provider, or the department that is doing the test:  When will my results be ready?  How will I get my results?  What are my treatment options?  What other tests do I need?  What are my next steps? Summary  The hemoglobin A1c test (HbA1c test) may be done to evaluate your risk for developing diabetes, to diagnose diabetes, and to monitor long-term control of blood sugar (glucose) in people who have diabetes and help make  treatment decisions.  Hemoglobin is a type of protein in the blood that carries oxygen. Glucose attaches to hemoglobin to form glycated hemoglobin. This test checks the amount of glycated hemoglobin in your blood, which is a good indicator of the average amount of glucose in your blood during the past 2-3 months.  Talk with your health care provider about what your results  mean. This information is not intended to replace advice given to you by your health care provider. Make sure you discuss any questions you have with your health care provider. Document Revised: 02/02/2017 Document Reviewed: 10/03/2016 Elsevier Patient Education  [place]. Hyperglycemia Hyperglycemia occurs when the level of sugar (glucose) in the blood is too high. Glucose is a type of sugar that provides the body's main source of energy. Certain hormones (insulin and glucagon) control the level of glucose in the blood. Insulin lowers blood glucose, and glucagon increases blood glucose. Hyperglycemia can result from having too little insulin in the bloodstream, or from the body not responding normally to insulin. Hyperglycemia occurs most often in people who have diabetes (diabetes mellitus), but it can happen in people who do not have diabetes. It can develop quickly, and it can be life-threatening if it causes you to become severely dehydrated (diabetic ketoacidosis or hyperglycemic hyperosmolar state). Severe hyperglycemia is a medical emergency. What are the causes? If you have diabetes, hyperglycemia may be caused by:  Diabetes medicine.  Medicines that increase blood glucose or affect your diabetes control.  Not eating enough, or not eating often enough.  Changes in physical activity level.  Being sick or having an infection. If you have prediabetes or undiagnosed diabetes:  Hyperglycemia may be caused by those conditions. If you do not have diabetes, hyperglycemia may be caused by:  Certain medicines, including steroid medicines, beta-blockers, epinephrine, and thiazide diuretics.  Stress.  Serious illness.  Surgery.  Diseases of the pancreas.  Infection. What increases the risk? Hyperglycemia is more likely to develop in people who have risk factors for diabetes, such as:  Having a family member with diabetes.  Having a gene for type 1 diabetes that is  passed from parent to child (inherited).  Living in an area with cold weather conditions.  Exposure to certain viruses.  Certain conditions in which the body's disease-fighting (immune) system attacks itself (autoimmune disorders).  Being overweight or obese.  Having an inactive (sedentary) lifestyle.  Having been diagnosed with insulin resistance.  Having a history of prediabetes, gestational diabetes, or polycystic ovarian syndrome (PCOS).  Being of American-Indian, African-American, Hispanic/Latino, or Asian/Pacific Islander descent. What are the signs or symptoms? Hyperglycemia may not cause any symptoms. If you do have symptoms, they may include early warning signs, such as:  Increased thirst.  Hunger.  Feeling very tired.  Needing to urinate more often than usual.  Blurry vision. Other symptoms may develop if hyperglycemia gets worse, such as:  Dry mouth.  Loss of appetite.  Fruity-smelling breath.  Weakness.  Unexpected or rapid weight gain or weight loss.  Tingling or numbness in the hands or feet.  Headache.  Skin that does not quickly return to normal after being lightly pinched and released (poor skin turgor).  Abdominal pain.  Cuts or bruises that are slow to heal. How is this diagnosed? Hyperglycemia is diagnosed with a blood test to measure your blood glucose level. This blood test is usually done while you are having symptoms. Your health care provider may also do a physical exam and review  your medical history. You may have more tests to determine the cause of your hyperglycemia, such as:  A fasting blood glucose (FBG) test. You will not be allowed to eat (you will fast) for at least 8 hours before a blood sample is taken.  An A1c (hemoglobin A1c) blood test. This provides information about blood glucose control over the previous 2-3 months.  An oral glucose tolerance test (OGTT). This measures your blood glucose at two times: ? After  fasting. This is your baseline blood glucose level. ? Two hours after drinking a beverage that contains glucose. How is this treated? Treatment depends on the cause of your hyperglycemia. Treatment may include:  Taking medicine to regulate your blood glucose levels. If you take insulin or other diabetes medicines, your medicine or dosage may be adjusted.  Lifestyle changes, such as exercising more, eating healthier foods, or losing weight.  Treating an illness or infection, if this caused your hyperglycemia.  Checking your blood glucose more often.  Stopping or reducing steroid medicines, if these caused your hyperglycemia. If your hyperglycemia becomes severe and it results in hyperglycemic hyperosmolar state, you must be hospitalized and given IV fluids. Follow these instructions at home:  General instructions  Take over-the-counter and prescription medicines only as told by your health care provider.  Do not use any products that contain nicotine or tobacco, such as cigarettes and e-cigarettes. If you need help quitting, ask your health care provider.  Limit alcohol intake to no more than 1 drink per day for nonpregnant women and 2 drinks per day for men. One drink equals 12 oz of beer, 5 oz of wine, or 1 oz of hard liquor.  Learn to manage stress. If you need help with this, ask your health care provider.  Keep all follow-up visits as told by your health care provider. This is important. Eating and drinking   Maintain a healthy weight.  Exercise regularly, as directed by your health care provider.  Stay hydrated, especially when you exercise, get sick, or spend time in hot temperatures.  Eat healthy foods, such as: ? Lean proteins. ? Complex carbohydrates. ? Fresh fruits and vegetables. ? Low-fat dairy products. ? Healthy fats.  Drink enough fluid to keep your urine clear or pale yellow. If you have diabetes:  Make sure you know the symptoms of  hyperglycemia.  Follow your diabetes management plan, as told by your health care provider. Make sure you: ? Take your insulin and medicines as directed. ? Follow your exercise plan. ? Follow your meal plan. Eat on time, and do not skip meals. ? Check your blood glucose as often as directed. Make sure to check your blood glucose before and after exercise. If you exercise longer or in a different way than usual, check your blood glucose more often. ? Follow your sick day plan whenever you cannot eat or drink normally. Make this plan in advance with your health care provider.  Share your diabetes management plan with people in your workplace, school, and household.  Check your urine for ketones when you are ill and as told by your health care provider.  Carry a medical alert card or wear medical alert jewelry. Contact a health care provider if:  Your blood glucose is at or above 240 mg/dL (13.3 mmol/L) for 2 days in a row.  You have problems keeping your blood glucose in your target range.  You have frequent episodes of hyperglycemia. Get help right away if:  You have difficulty  breathing.  You have a change in how you think, feel, or act (mental status).  You have nausea or vomiting that does not go away. These symptoms may represent a serious problem that is an emergency. Do not wait to see if the symptoms will go away. Get medical help right away. Call your local emergency services (911 in the [place] Do not drive yourself to the hospital. Summary  Hyperglycemia occurs when the level of sugar (glucose) in the blood is too high.  Hyperglycemia is diagnosed with a blood test to measure your blood glucose level. This blood test is usually done while you are having symptoms. Your health care provider may also do a physical exam and review your medical history.  If you have diabetes, follow your diabetes management plan as told by your health care provider.  Contact your health care  provider if you have problems keeping your blood glucose in your target range. This information is not intended to replace advice given to you by your health care provider. Make sure you discuss any questions you have with your health care provider. Document Revised: 11/08/2015 Document Reviewed: 11/08/2015 Elsevier Patient Education  [place].

## 2019-05-06 NOTE — Progress Notes (Addendum)
NAME:  Christine Sims, MRN:  [id number], DOB:  11/06/1955, LOS: 1 ADMISSION DATE:  05/05/2019   Subjective/Interm history  No overnight events. VSS Updated on plan for diuresis Notes that her weight at home is around 230-240#.  Objective   Blood pressure 127/75, pulse 88, temperature 97.9 F (36.6 C), temperature source Oral, resp. rate (!) 26, height 5\' 4"  (1.626 m), weight 121.2 kg, SpO2 97 %.     Intake/Output Summary (Last 24 hours) at 05/06/2019 1206 Last data filed at 05/06/2019 0919 Gross per 24 hour  Intake 1002 ml  Output 1300 ml  Net -298 ml   Filed Weights   05/05/19 1720 05/06/19 0545  Weight: 121.8 kg 121.2 kg    Examination: GENERAL: in no acute distress CARDIAC: heart RRR. Significant LE edema extending into her abdomen. JVD difficult to assess PULMONARY: external auditory wheezes. Speaking in 3-4 word sentences. Poor air movement in lower lobes ABDOMEN: soft.  Nondistended.  SKIN: stasis dermatitis of bilateral lower extremities  Significant Diagnostic Tests:  CXR: Diffuse hazy opacities bilaterally.  Cardiomegaly present   Labs    CBC Latest Ref Rng & Units 05/06/2019 05/05/2019 05/05/2019  WBC 4.0 - 10.5 K/uL 4.8 - 5.5  Hemoglobin 12.0 - 15.0 g/dL 7.9(L) 9.5(L) 8.3(L)  Hematocrit 36.0 - 46.0 % 26.2(L) 28.0(L) 28.8(L)  Platelets 150 - 400 K/uL 211 - 209   BMP Latest Ref Rng & Units 05/06/2019 05/05/2019 05/05/2019  Glucose 70 - 99 mg/dL 226(H) - 325(H)  BUN 8 - 23 mg/dL 21 - 22  Creatinine 0.44 - 1.00 mg/dL 1.60(H) - 1.59(H)  Sodium 135 - 145 mmol/L 140 141 140  Potassium 3.5 - 5.1 mmol/L 3.8 3.6 3.8  Chloride 98 - 111 mmol/L 103 - 104  CO2 22 - 32 mmol/L 27 - 23  Calcium 8.9 - 10.3 mg/dL 8.3(L) - 8.4(L)    Summary  65 yo chronically ill female with 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  Assessment & Plan:  Active  Problems:   Acute exacerbation of CHF (congestive heart failure) (HCC)  NYHA Class III Combined Heart Failure Exacerbation Presenting weight 122 kg 600cc output overnight Tachycardic overnight with HR in 120s-140s Can consider restarting hydralazine if blood pressure is able to handle it. Will re-evaluate in the next couple of days. Plan 80mg  Lasix BID IV Home dose imdur restarted. Will also start on spironolactone 12.5 mg. Will need to monitor K and renal function carefully Daily weights, strict I/Os, fluid restriction 1278mL, Tele monitoring  Chronic COPD with chronic respiratory failure. Suspected exacerbation.  [place] 2L supplemental O2 requirement Currently maintaining O2 sats at 100% on 3L No increase in sputum production however exam demonstrates little to no air movement posteriorly, worse at the bases. If her wheezing was strictly from the heart failure, I would suspect that she would still have some air movement and I think it would be appropriate to treat as an exacerbation. Likely a component of OHS and patient would benefit from outpatient sleep study. Plan: duonebs qid, incruse, dulera. Will start 5d course of prednisone 40mg . Maintain O2 sats 88-92%. If sats are >92%, turn down O2. Call primary team if there are any questions or concerns regarding this.  Chronic Hypertension with history of CVA Per chart review, appears she was started on hydralazine and imdur at her last hospitalization. Plan: Continue home  dose of metoprolol 100 mg.  Continue Plavix and statin.  T2DM. SSI with HS coverage + 50U Lantus BID. Will likely need to increase in the setting of starting steroid therapy. CKD III: renal function stable. Will continue to monitor. Borderline iron deficient, inflammatory anemia. Baseline in low 8's. Iron panel obtained in January: Transferrin 176, iron 76, ferritin 70, TIBC 256, UIBC  180, %saturation 30%. Will consider iron transfusion in setting of heart failure.  Will continue to monitor.  Best practice:  CODE STATUS: DNR Diet: cardiac, diabetic, 123mL fluid restriction DVT for prophylaxis: lovenox Dispo: pending diuresis and clinical improvement   Mitzi Hansen, MD INTERNAL MEDICINE RESIDENT PGY-1 PAGER #: [phone] 05/06/19  12:06 PM

## 2019-05-06 NOTE — Evaluation (Signed)
Physical Therapy Evaluation Patient Details Name: Christine Sims MRN: [id number] DOB: March 07, 1955 Today's Date: 05/06/2019   History of Present Illness  Pt is a 64 y/o female with PMH of combined heart failure (EF 30 to 35%), COPD requiring 2 L supplemental oxygen at baseline, hypertension, type 2 diabetes who is presenting with 1 week history of progressive shortness of breath and lower extremity edema. Admitted for acute exacerbation of CHF.   Clinical Impression  Patient presents with generalized weakness, dyspnea at rest and worsened with exertion, decreased activity tolerance and impaired mobility s/p above. Pt reports being Mod I PTA using rollator for short household distances (to bathroom and back). Limited due to SOB. Today, pt tolerated standing x2 from chair but declined further mobility. Noted to have 3/4 DOE with talking and with minimal activity. VSS on 3L/min 02 [place]. Will follow acutely to maximize independence and mobility prior to return home.    Follow Up Recommendations Home health PT;Supervision - Intermittent    Equipment Recommendations  None recommended by PT    Recommendations for Other Services       Precautions / Restrictions Precautions Precautions: Fall Precaution Comments: watch O2 Restrictions Weight Bearing Restrictions: No      Mobility  Bed Mobility               General bed mobility comments: OOB in recliner upon entry, reports sleeping in recliner   Transfers Overall transfer level: Needs assistance Equipment used: None Transfers: Sit to/from Stand Sit to Stand: Supervision         General transfer comment: Supervision for safety. Stood from chair x2. 3/4 SOB with minimal to no activity.  Ambulation/Gait             General Gait Details: Declined  Stairs            Wheelchair Mobility    Modified Rankin (Stroke Patients Only)       Balance Overall balance assessment: Mild deficits observed, not formally  tested                                           Pertinent Vitals/Pain Pain Assessment: Faces Faces Pain Scale: Hurts little more Pain Location: headache Pain Descriptors / Indicators: Headache Pain Intervention(s): Repositioned;Monitored during session;Patient requesting pain meds-RN notified    Home Living Family/patient expects to be discharged to:: Private residence Living Arrangements: Children(daughter) Available Help at Discharge: Family;Available 24 hours/day Type of Home: House Home Access: Level entry     Home Layout: One level Home Equipment: Tub bench;Hospital bed;Walker - 4 wheels;Cane - single point;Other (comment);Bedside commode Additional Comments: per chart HHaide for IADL assist    Prior Function Level of Independence: Independent with assistive device(s)         Comments: uses rollator for mobility, walks only to bathroom and back; some assist for LB ADLs, IADLs, not driving. Sleeps in lift chair     Hand Dominance   Dominant Hand: Right    Extremity/Trunk Assessment   Upper Extremity Assessment Upper Extremity Assessment: Defer to OT evaluation    Lower Extremity Assessment Lower Extremity Assessment: Generalized weakness;RLE deficits/detail;LLE deficits/detail RLE Sensation: decreased light touch LLE Sensation: decreased light touch    Cervical / Trunk Assessment Cervical / Trunk Assessment: Normal  Communication   Communication: No difficulties  Cognition Arousal/Alertness: Awake/alert Behavior During Therapy: WFL for tasks assessed/performed Overall Cognitive  Status: Within Functional Limits for tasks assessed                                 General Comments: Pt reporting she wants to "lose her belly," but declines doing any exercise.      General Comments General comments (skin integrity, edema, etc.): Sp02 >95% on 3L/min 02 [place].    Exercises General Exercises - Lower Extremity Long Arc Quad:  Both;10 reps;Seated   Assessment/Plan    PT Assessment Patient needs continued PT services  PT Problem List Pain;Cardiopulmonary status limiting activity;Decreased activity tolerance;Impaired sensation;Decreased mobility;Decreased strength       PT Treatment Interventions Therapeutic activities;Gait training;Therapeutic exercise;Patient/family education;Balance training;Functional mobility training    PT Goals (Current goals can be found in the Care Plan section)  Acute Rehab PT Goals Patient Stated Goal: " to get rid of this belly." PT Goal Formulation: With patient Time For Goal Achievement: 05/20/19 Potential to Achieve Goals: Fair    Frequency Min 3X/week   Barriers to discharge        Co-evaluation               AM-PAC PT "6 Clicks" Mobility  Outcome Measure Help needed turning from your back to your side while in a flat bed without using bedrails?: None Help needed moving from lying on your back to sitting on the side of a flat bed without using bedrails?: None Help needed moving to and from a bed to a chair (including a wheelchair)?: None Help needed standing up from a chair using your arms (e.g., wheelchair or bedside chair)?: None Help needed to walk in hospital room?: A Little Help needed climbing 3-5 steps with a railing? : A Little 6 Click Score: 22    End of Session Equipment Utilized During Treatment: Oxygen Activity Tolerance: Other (comment)(SOB) Patient left: in chair;with call bell/phone within reach Nurse Communication: Mobility status PT Visit Diagnosis: Muscle weakness (generalized) (M62.81);Difficulty in walking, not elsewhere classified (R26.2)    Time: [id number] PT Time Calculation (min) (ACUTE ONLY): 17 min   Charges:   PT Evaluation $PT Eval Moderate Complexity: 1 Mod          Marisa Severin, PT, DPT Acute Rehabilitation Services Pager [phone] Office [place] 05/06/2019, 3:24 PM

## 2019-05-06 NOTE — Evaluation (Signed)
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 Admitted for acute exacerbation of CHF.    Clinical Impression   PTA patient reports independent with mobility using rollator, assist with LB ADLs and IADL assist from [facility] aide.  Admitted for above and limited by problem list below, including impaired balance, decreased activity tolerance, generalized weakness.  On 2L O2 at baseline via [place], currently on 3L, SOB/wheezing with minimal activity but SPO2 98%.  Currently requires mod assist for LB ADLs, setup assist for UB ADLs, and supervision for sit to stand transfer. Declined further mobility at this time.  PT will benefit from continued OT services while admitted and after dc at [facility] level in order to optimize independence and safety with ADLs.  IF mobility does not progress, may need to look into SNF.     Follow Up Recommendations  Home health OT;Supervision/Assistance - 24 hour    Equipment Recommendations  None recommended by OT    Recommendations for Other Services       Precautions / Restrictions Precautions Precautions: Fall Precaution Comments: watch O2 Restrictions Weight Bearing Restrictions: No      Mobility Bed Mobility               General bed mobility comments: OOB in recliner upon entry, reports sleeping in recliner   Transfers Overall transfer level: Needs assistance Equipment used: Rolling walker (2 wheeled) Transfers: Sit to/from Stand Sit to Stand: Supervision         General transfer comment: for safety     Balance Overall balance assessment: Mild deficits observed, not formally tested                                          ADL either performed or assessed with clinical judgement   ADL Overall ADL's : Needs assistance/impaired     Grooming: Set up;Sitting   Upper Body Bathing: Set up;Sitting   Lower Body Bathing: Moderate assistance;Sit to/from stand Lower Body Bathing Details (indicate cue type and reason): unable to reach BLEs  Upper Body Dressing : Set up;Sitting   Lower Body Dressing: Moderate assistance;Sit to/from stand Lower Body Dressing Details (indicate cue type and reason): requires assist to reach BLEs to manage clothing- baseline    Toilet Transfer Details (indicate cue type and reason): sit to stand with supervision, declines transfer to commode         Functional mobility during ADLs: Supervision/safety General ADL Comments: pt limited by generalized weakness, decreased activity tolerance and endurance; on 3L supplemental O2 during session 98%      Vision         Perception     Praxis      Pertinent Vitals/Pain Pain Assessment: 0-10 Pain Score: 8  Pain Location: back, headache  Pain Descriptors / Indicators: Discomfort;Grimacing Pain Intervention(s): Monitored during session;Patient requesting pain meds-RN notified     Hand Dominance Right   Extremity/Trunk Assessment Upper Extremity Assessment Upper Extremity Assessment: Generalized weakness   Lower Extremity Assessment Lower Extremity Assessment: Defer to PT evaluation  Communication Communication Communication: No difficulties   Cognition Arousal/Alertness: Awake/alert Behavior During Therapy: WFL for tasks assessed/performed Overall Cognitive Status: Within Functional Limits for tasks assessed                                     General Comments  3L O2 via [place], SPO2 98%    Exercises     Shoulder Instructions      Home Living Family/patient expects to be discharged to:: Private residence Living Arrangements: Children(daughter  ) Available Help at Discharge: Family;Available 24 hours/day Type of Home: House Home Access: Level entry     Home Layout: One level     Bathroom Shower/Tub: Teacher, early years/pre: Handicapped height     Home Equipment: Tub bench;Hospital bed;Walker - 4 wheels;Cane - single point;Other (comment);Bedside commode(home O2 2L )   Additional Comments: per chart [facility] for IADL assist      Prior Functioning/Environment Level of Independence: Independent with assistive device(s)        Comments: uses rollator for mobility, some assist for LB ADLs, IADLs, not dirivng         OT Problem List: Decreased strength;Decreased activity tolerance;Cardiopulmonary status limiting activity;Pain;Obesity      OT Treatment/Interventions: Self-care/ADL training;Therapeutic exercise;DME and/or AE instruction;Therapeutic activities;Balance training;Patient/family education    OT Goals(Current goals can be found in the care plan section) Acute Rehab OT Goals Patient Stated Goal: to feel better and get home  OT Goal Formulation: With patient Time For Goal Achievement: 05/20/19 Potential to Achieve Goals: Good  OT Frequency: Min 2X/week   Barriers to D/C:            Co-evaluation              AM-PAC OT "6 Clicks" Daily Activity     Outcome Measure Help from another person eating meals?: None Help from another person taking care of personal grooming?: A Little Help from another person toileting, which includes using toliet, bedpan, or urinal?: A Lot Help from another person bathing (including washing, rinsing, drying)?: A Lot Help from another person to put on and taking off regular upper body clothing?: None Help from another person to put on and taking off regular lower body clothing?: A Lot 6 Click Score: 17   End of Session Equipment Utilized During Treatment: Rolling walker;Oxygen(3L) Nurse Communication: Mobility status  Activity Tolerance: Patient tolerated  treatment well Patient left: in chair;with call bell/phone within reach;with nursing/sitter in room  OT Visit Diagnosis: Other abnormalities of gait and mobility (R26.89);Muscle weakness (generalized) (M62.81);Pain Pain - part of body: (back, headache )                Time: [id number] OT Time Calculation (min): 13 min Charges:  OT General Charges $OT Visit: 1 Visit OT Evaluation $OT Eval Moderate Complexity: 1 Mod  Jolaine Artist, OT Acute Rehabilitation Services Pager [phone] Office [phone]    Christine Sims 05/06/2019, 9:30 AM

## 2019-05-06 NOTE — Progress Notes (Addendum)
Inpatient Diabetes Program Recommendations  AACE/ADA: New Consensus Statement on Inpatient Glycemic Control (2015)  Target Ranges:  Prepandial:   less than 140 mg/dL      Peak postprandial:   less than 180 mg/dL (1-2 hours)      Critically ill patients:  140 - 180 mg/dL   Lab Results  Component Value Date   GLUCAP 215 (H) 05/06/2019   HGBA1C 10.0 (H) 05/05/2019    Review of Glycemic Control Results for Christine Sims, Christine Sims (MRN [id number]) as of 05/06/2019 13:10  Ref. Range 05/05/2019 16:43 05/05/2019 17:53 05/05/2019 21:35 05/06/2019 06:02  Glucose-Capillary Latest Ref Range: 70 - 99 mg/dL 267 (H) 257 (H) 242 (H) 215 (H)   Diabetes history: Type 2 DM Outpatient Diabetes medications: Novolog 30 units TID, Lantus 60 units BID Current orders for Inpatient glycemic control: Lantus 50 units BID, Novolog 0-20 units TID, Novolog 0-5 units QHS Prednisone 40 mg QAM  Inpatient Diabetes Program Recommendations:    In the setting of steroids, consider adding Novolog 8 units TID (assuming patient is consuming >50% of meals).    Reviewed patient's current A1c of 10.0%. Explained what a A1c is and what it measures. Also reviewed goal A1c with patient, importance of good glucose control @ home, and blood sugar goals. Reviewed patho of DM, need for insulin, impact of insulin and weight gain and steroids, vascular changes and commorbidities.  Patient needs a meter at discharge. Has not been checking blood sugars. Patient states she is willing to start checking 2-3 times per day. Reviewed frequency and when to call MD.  MD please order at discharge:  -Accu-chek Aviva Plus with device kit [phone]) -Accu-chek Aviva Plus VI strip (#[id number]) Stressed the importance of when to call MD and establishing a plan when on steroids with insulin. Patient has no further needs at this time. MD to update orders.  Thanks, Bronson Curb, MSN, RNC-OB Diabetes Coordinator [phone] (8a-5p)

## 2019-05-07 DIAGNOSIS — I5043 Acute on chronic combined systolic (congestive) and diastolic (congestive) heart failure: Secondary | ICD-10-CM

## 2019-05-07 DIAGNOSIS — D649 Anemia, unspecified: Secondary | ICD-10-CM

## 2019-05-07 DIAGNOSIS — J9611 Chronic respiratory failure with hypoxia: Secondary | ICD-10-CM

## 2019-05-07 LAB — BASIC METABOLIC PANEL
Anion gap: 10 (ref 5–15)
BUN: 25 mg/dL — ABNORMAL HIGH (ref 8–23)
CO2: 27 mmol/L (ref 22–32)
Calcium: 8.5 mg/dL — ABNORMAL LOW (ref 8.9–10.3)
Chloride: 104 mmol/L (ref 98–111)
Creatinine, Ser: 1.95 mg/dL — ABNORMAL HIGH (ref 0.44–1.00)
GFR calc Af Amer: 31 mL/min — ABNORMAL LOW (ref >60)
GFR calc non Af Amer: 27 mL/min — ABNORMAL LOW (ref >60)
Glucose, Bld: 92 mg/dL (ref 70–99)
Potassium: 4 mmol/L (ref 3.5–5.1)
Sodium: 141 mmol/L (ref 135–145)

## 2019-05-07 LAB — CBC
HCT: 28.5 % — ABNORMAL LOW (ref 36.0–46.0)
Hemoglobin: 8.4 g/dL — ABNORMAL LOW (ref 12.0–15.0)
MCH: 25.8 pg — ABNORMAL LOW (ref 26.0–34.0)
MCHC: 29.5 g/dL — ABNORMAL LOW (ref 30.0–36.0)
MCV: 87.4 fL (ref 80.0–100.0)
Platelets: 236 10*3/uL (ref 150–400)
RBC: 3.26 MIL/uL — ABNORMAL LOW (ref 3.87–5.11)
RDW: 17.6 % — ABNORMAL HIGH (ref 11.5–15.5)
WBC: 4.5 10*3/uL (ref 4.0–10.5)
nRBC: 0 % (ref 0.0–0.2)

## 2019-05-07 LAB — FERRITIN: Ferritin: 112 ng/mL (ref 11–307)

## 2019-05-07 LAB — IRON AND TIBC
Iron: 37 ug/dL (ref 28–170)
Saturation Ratios: 15 % (ref 10.4–31.8)
TIBC: 244 ug/dL — ABNORMAL LOW (ref 250–450)
UIBC: 207 ug/dL

## 2019-05-07 LAB — GLUCOSE, CAPILLARY
Glucose-Capillary: 95 mg/dL (ref 70–99)
Glucose-Capillary: 91 mg/dL (ref 70–99)
Glucose-Capillary: 203 mg/dL — ABNORMAL HIGH (ref 70–99)
Glucose-Capillary: 328 mg/dL — ABNORMAL HIGH (ref 70–99)
Glucose-Capillary: 201 mg/dL — ABNORMAL HIGH (ref 70–99)

## 2019-05-07 LAB — MAGNESIUM: Magnesium: 2.2 mg/dL (ref 1.7–2.4)

## 2019-05-07 LAB — SARS CORONAVIRUS 2 (TAT 6-24 HRS): SARS Coronavirus 2: NEGATIVE

## 2019-05-07 MED ORDER — SODIUM CHLORIDE 0.9 % IV SOLN
INTRAVENOUS | Status: DC | PRN
  Administered 2019-05-07: 250 mL via INTRAVENOUS

## 2019-05-07 MED ORDER — SODIUM CHLORIDE 0.9 % IV SOLN
510.0000 mg | INTRAVENOUS | Status: DC
  Administered 2019-05-07: 510 mg via INTRAVENOUS
  Filled 2019-05-07: qty 17

## 2019-05-07 MED ORDER — INSULIN ASPART 100 UNIT/ML ~~LOC~~ SOLN
0.0000 [IU] | Freq: Three times a day (TID) | SUBCUTANEOUS | Status: DC
  Administered 2019-05-07: 15 [IU] via SUBCUTANEOUS
  Administered 2019-05-07: 7 [IU] via SUBCUTANEOUS
  Administered 2019-05-08: 4 [IU] via SUBCUTANEOUS
  Administered 2019-05-08: 7 [IU] via SUBCUTANEOUS
  Administered 2019-05-08: 20 [IU] via SUBCUTANEOUS
  Administered 2019-05-09: 06:00:00 3 [IU] via SUBCUTANEOUS
  Administered 2019-05-09 – 2019-05-10 (×3): 20 [IU] via SUBCUTANEOUS
  Administered 2019-05-10: 11 [IU] via SUBCUTANEOUS
  Administered 2019-05-11 – 2019-05-12 (×3): 7 [IU] via SUBCUTANEOUS
  Administered 2019-05-12: 12:00:00 11 [IU] via SUBCUTANEOUS
  Administered 2019-05-12: 17:00:00 4 [IU] via SUBCUTANEOUS
  Administered 2019-05-13: 11 [IU] via SUBCUTANEOUS
  Administered 2019-05-13: 7 [IU] via SUBCUTANEOUS
  Administered 2019-05-13: 07:00:00 15 [IU] via SUBCUTANEOUS
  Administered 2019-05-14: 07:00:00 3 [IU] via SUBCUTANEOUS

## 2019-05-07 MED ORDER — INSULIN ASPART 100 UNIT/ML ~~LOC~~ SOLN
5.0000 [IU] | Freq: Three times a day (TID) | SUBCUTANEOUS | Status: DC
  Administered 2019-05-07 – 2019-05-08 (×5): 5 [IU] via SUBCUTANEOUS

## 2019-05-07 MED ORDER — METOLAZONE 2.5 MG PO TABS
2.5000 mg | ORAL_TABLET | Freq: Once | ORAL | Status: AC
  Administered 2019-05-07: 2.5 mg via ORAL
  Filled 2019-05-07: qty 1

## 2019-05-07 MED ORDER — FUROSEMIDE 10 MG/ML IJ SOLN
120.0000 mg | Freq: Two times a day (BID) | INTRAVENOUS | Status: AC
  Administered 2019-05-07 – 2019-05-08 (×3): 120 mg via INTRAVENOUS
  Filled 2019-05-07: qty 2
  Filled 2019-05-07: qty 10
  Filled 2019-05-07: qty 12

## 2019-05-07 MED ORDER — PREDNISONE 20 MG PO TABS
40.0000 mg | ORAL_TABLET | Freq: Every day | ORAL | Status: DC
  Administered 2019-05-07 – 2019-05-10 (×4): 40 mg via ORAL
  Filled 2019-05-07 (×4): qty 2

## 2019-05-07 NOTE — Progress Notes (Signed)
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 Admitted for acute exacerbation of CHF.     PT Comments    Pt OOB in recliner upon arrival of PT, agreeable to PT session with focus on progressing mobility. The pt continues to present with limitations in functional mobility, strength, and endurance compared to her prior level of function and independence due to above dx. The pt was able to demo good stability with hallway ambulation, but remains limited to short bouts with seated rest break of about 4 min to recover between each bout of ~50 ft ambulation. The pt will continue to benefit from skilled PT to maximize functional mobility and endurance prior to d/c home.     Follow Up Recommendations  Home health PT;Supervision - Intermittent     Equipment Recommendations  None recommended by PT    Recommendations for Other Services       Precautions / Restrictions Precautions Precautions: Fall Precaution Comments: watch O2, per RN SpO2 should be between 88-92% Restrictions Weight Bearing Restrictions: No    Mobility  Bed Mobility               General bed mobility comments: OOB in recliner upon entry, reports sleeping in recliner   Transfers Overall transfer level: Modified independent Equipment used: None;4-wheeled walker Transfers: Sit to/from Stand Sit to Stand: Supervision         General transfer comment: Supervision for safety. when PT left to retrive mask, pt up standing in room upon PT return, transfered to [facility] without use of AD or supervision.  Ambulation/Gait Ambulation/Gait assistance: Supervision Gait  Distance (Feet): 50 Feet(50 ft x2) Assistive device: 4-wheeled walker Gait Pattern/deviations: Step-through pattern;Decreased stride length;Shuffle;Trunk flexed Gait velocity: 0.4 m/s Gait velocity interpretation: <1.31 ft/sec, indicative of household ambulator General Gait Details: pt with slow but steady ambulation, sig increase in dyspnea with continued ambulation, needing seated rest after ~50 ft.   Stairs             Wheelchair Mobility    Modified Rankin (Stroke Patients Only)       Balance Overall balance assessment: Needs assistance Sitting-balance support: No upper extremity supported Sitting balance-Leahy Scale: Good Sitting balance - Comments: mod I   Standing balance support: Bilateral upper extremity supported;No upper extremity supported Standing balance-Leahy Scale: Good Standing balance comment: pt able to ambulate without AD, benefits fromBUE support on rollator for fatigue                            Cognition Arousal/Alertness: Awake/alert Behavior During Therapy: WFL for tasks assessed/performed;Impulsive Overall Cognitive Status: Within Functional Limits for tasks assessed                                 General Comments: Pt with good functional cognition and safety awareness, but stating she prefers to push recliner rather than use RW, educated on how RW is a little  more practical for hospital hallways, pt eventually agreeable      Exercises      General Comments  General comments (skin integrity, edema, etc.): SpO2 > 95% on 3L O2      Pertinent Vitals/Pain Pain Assessment: No/denies pain Pain Intervention(s): Limited activity within patient's tolerance;Monitored during session    Home Living                      Prior Function            PT Goals (current goals can now be found in the care plan section) Acute Rehab PT Goals Patient Stated Goal: " to get rid of this belly." PT Goal Formulation: With  patient Time For Goal Achievement: 05/20/19 Potential to Achieve Goals: Fair Progress towards PT goals: Progressing toward goals    Frequency           PT Plan Current plan remains appropriate    Co-evaluation              AM-PAC PT "6 Clicks" Mobility   Outcome Measure  Help needed turning from your back to your side while in a flat bed without using bedrails?: None Help needed moving from lying on your back to sitting on the side of a flat bed without using bedrails?: None Help needed moving to and from a bed to a chair (including a wheelchair)?: None Help needed standing up from a chair using your arms (e.g., wheelchair or bedside chair)?: None Help needed to walk in hospital room?: A Little Help needed climbing 3-5 steps with a railing? : A Little 6 Click Score: 22    End of Session Equipment Utilized During Treatment: Oxygen Activity Tolerance: Patient tolerated treatment well Patient left: in chair;with call bell/phone within reach;with nursing/sitter in room Nurse Communication: Mobility status PT Visit Diagnosis: Muscle weakness (generalized) (M62.81);Difficulty in walking, not elsewhere classified (R26.2)     Time: [id number] PT Time Calculation (min) (ACUTE ONLY): 35 min  Charges:  $Gait Training: 23-37 mins                     Karma Ganja, PT, DPT   Acute Rehabilitation Department Pager #: [phone]   Otho Bellows 05/07/2019, 1:35 PM

## 2019-05-07 NOTE — Progress Notes (Signed)
Patient is on 3L [place] and sat is 97%.  Patient did have some wheezing and patient is getting Lasix.  Bipap is a PRN order,will continue to monitor.

## 2019-05-07 NOTE — Progress Notes (Signed)
Patient states she cannot breath very well after transferring from bedside commode to chair and taking her oxygen via nasal cannula off. Nasal cannula reapplied at 3 liters, respiratory therapist called. Richardson Landry, Respiratory therapist currently at bedside. Current vitals are as follows:   05/07/19 1812  Vitals  BP (!) 154/96  MAP (mmHg) 114  BP Location Left Arm  BP Method Automatic  Patient Position (if appropriate) Sitting  Pulse Rate (!) 104  Pulse Rate Source Monitor  Resp 19  Oxygen Therapy  SpO2 99 %  O2 Device Nasal Cannula  O2 Flow Rate (L/min) 3 L/min  MEWS Score  MEWS Temp 0  MEWS Systolic 0  MEWS Pulse 1  MEWS RR 0  MEWS LOC 0  MEWS Score 1  MEWS Score Color Green   After pursed lip breathing techniques and oxygen application, patient states she feels much better.

## 2019-05-07 NOTE — Progress Notes (Signed)
NAME:  Christine Sims, MRN:  [id number], DOB:  11-23-1955, LOS: 2 ADMISSION DATE:  05/05/2019   Subjective/Interm history  No overnight events.  VSS Updated on continued diuresis  Objective   Blood pressure 131/72, pulse 87, temperature (!) 97.5 F (36.4 C), temperature source Oral, resp. rate (!) 22, height 5\' 4"  (1.626 m), weight 121.5 kg, SpO2 100 %.     Intake/Output Summary (Last 24 hours) at 05/07/2019 0930 Last data filed at 05/07/2019 [id number] Gross per 24 hour  Intake 617.87 ml  Output 800 ml  Net -182.13 ml   Filed Weights   05/05/19 1720 05/06/19 0545 05/07/19 0640  Weight: 121.8 kg 121.2 kg 121.5 kg    Examination: GENERAL: in no acute distress CARDIAC: heart RRR. +3 LE edema extending to pre-sacrum. JVD difficult to assess PULMONARY: gross auditory wheezes not appreciated this morning. Improved air movement in lower lobes. ABDOMEN: soft.  Nondistended.  SKIN: stasis dermatitis of bilateral lower extremities  Significant Diagnostic Tests:  CXR: Diffuse hazy opacities bilaterally.  Cardiomegaly present   Labs    CBC Latest Ref Rng & Units 05/07/2019 05/06/2019 05/05/2019  WBC 4.0 - 10.5 K/uL 4.5 4.8 -  Hemoglobin 12.0 - 15.0 g/dL 8.4(L) 7.9(L) 9.5(L)  Hematocrit 36.0 - 46.0 % 28.5(L) 26.2(L) 28.0(L)  Platelets 150 - 400 K/uL 236 211 -   BMP Latest Ref Rng & Units 05/06/2019 05/05/2019 05/05/2019  Glucose 70 - 99 mg/dL 226(H) - 325(H)  BUN 8 - 23 mg/dL 21 - 22  Creatinine 0.44 - 1.00 mg/dL 1.60(H) - 1.59(H)  Sodium 135 - 145 mmol/L 140 141 140  Potassium 3.5 - 5.1 mmol/L 3.8 3.6 3.8  Chloride 98 - 111 mmol/L 103 - 104  CO2 22 - 32 mmol/L 27 - 23  Calcium 8.9 - 10.3 mg/dL 8.3(L) - 8.4(L)   Iron panel: iron 37, TIBC 244, 15% saturation, UIBC 207, ferritin 112 Summary  64 yo chronically ill female with combined heart failure (EF 30-35%) and COPD who presented to [facility] with 1w history of progressive edema and shortness of breath and found to be in a heart failure  exacerbation.  Assessment & Plan:  Active Problems:   Acute exacerbation of CHF (congestive heart failure) (HCC)  NYHA Class III Combined Heart Failure Exacerbation Presenting weight 122 kg. Reported baseline about 105-110kg Down 0.3kg.  Net output -480 Can consider restarting hydralazine if blood pressure is able to handle it. Will re-evaluate in the next couple of days. Plan 120mg  Lasix BID IV Continue imdur and spironolactone. Daily weights, strict I/Os, fluid restriction 1263mL, Tele monitoring  Chronic COPD with chronic respiratory failure. Suspected exacerbation.  [place] 2L supplemental O2 requirement Down to 2L supplemental O2 -Likely a component of OHS as well and patient would benefit from outpatient sleep study. Plan: duonebs qid, incruse, dulera. Continue prednisone--day #2/5  Maintain O2 sats 88-92%. If sats are >92%, turn down O2. Call primary team if there are any questions or concerns regarding this.  Chronic Hypertension with history of CVA Per chart review, appears she was started on hydralazine and imdur at her last hospitalization. Plan: Continue home dose of metoprolol 100 mg. Spironolactone started 05/06/19. Will continue to hold hydralazine to avoid hypotension. Continue Plavix and statin.  T2DM. Blood sugars stable. SSI with HS coverage + 50U Lantus BID.   CKD III: renal function stable. Will continue to monitor.  Borderline Iron Deficency Anemia. Hgb at baseline. Iron panel borderline iron deficient.No sign of active infectious process. Will transfuse  with ferriheme x1.   Best practice:  CODE STATUS: DNR Diet: cardiac, diabetic, 1552mL fluid restriction DVT for prophylaxis: lovenox Dispo: pending diuresis and clinical improvement   Mitzi Hansen, MD INTERNAL MEDICINE RESIDENT PGY-1 PAGER #: [phone] 05/07/19  9:30 AM

## 2019-05-08 DIAGNOSIS — Z7902 Long term (current) use of antithrombotics/antiplatelets: Secondary | ICD-10-CM

## 2019-05-08 LAB — BASIC METABOLIC PANEL
Anion gap: 15 (ref 5–15)
BUN: 32 mg/dL — ABNORMAL HIGH (ref 8–23)
CO2: 27 mmol/L (ref 22–32)
Calcium: 9 mg/dL (ref 8.9–10.3)
Chloride: 100 mmol/L (ref 98–111)
Creatinine, Ser: 2.17 mg/dL — ABNORMAL HIGH (ref 0.44–1.00)
GFR calc Af Amer: 27 mL/min — ABNORMAL LOW (ref >60)
GFR calc non Af Amer: 24 mL/min — ABNORMAL LOW (ref >60)
Glucose, Bld: 211 mg/dL — ABNORMAL HIGH (ref 70–99)
Potassium: 4.3 mmol/L (ref 3.5–5.1)
Sodium: 142 mmol/L (ref 135–145)

## 2019-05-08 LAB — GLUCOSE, CAPILLARY
Glucose-Capillary: 187 mg/dL — ABNORMAL HIGH (ref 70–99)
Glucose-Capillary: 133 mg/dL — ABNORMAL HIGH (ref 70–99)
Glucose-Capillary: 227 mg/dL — ABNORMAL HIGH (ref 70–99)
Glucose-Capillary: 382 mg/dL — ABNORMAL HIGH (ref 70–99)
Glucose-Capillary: 202 mg/dL — ABNORMAL HIGH (ref 70–99)

## 2019-05-08 MED ORDER — IPRATROPIUM-ALBUTEROL 0.5-2.5 (3) MG/3ML IN SOLN
3.0000 mL | Freq: Three times a day (TID) | RESPIRATORY_TRACT | Status: DC
  Administered 2019-05-08 – 2019-05-09 (×3): 3 mL via RESPIRATORY_TRACT
  Filled 2019-05-08 (×3): qty 3

## 2019-05-08 MED ORDER — SPIRONOLACTONE 25 MG PO TABS
25.0000 mg | ORAL_TABLET | Freq: Every day | ORAL | Status: DC
  Administered 2019-05-08 – 2019-05-11 (×4): 25 mg via ORAL
  Filled 2019-05-08 (×4): qty 1

## 2019-05-08 MED ORDER — INSULIN ASPART 100 UNIT/ML ~~LOC~~ SOLN
7.0000 [IU] | Freq: Three times a day (TID) | SUBCUTANEOUS | Status: DC
  Administered 2019-05-08 – 2019-05-09 (×3): 7 [IU] via SUBCUTANEOUS

## 2019-05-08 NOTE — Progress Notes (Signed)
NAME:  KATERRIA LUKASIK, MRN:  [id number], DOB:  Oct 15, 1955, LOS: 3 ADMISSION DATE:  05/05/2019   Subjective/Interm history  No overnight events VSS Notes some shortness of breath overnight but is otherwise feeling well. Endorses dry cough.  Objective   Blood pressure 120/72, pulse 95, temperature 98.2 F (36.8 C), temperature source Oral, resp. rate (!) 21, height 5\' 4"  (1.626 m), weight 120 kg, SpO2 98 %.     Intake/Output Summary (Last 24 hours) at 05/08/2019 0936 Last data filed at 05/08/2019 [id number] Gross per 24 hour  Intake 747.62 ml  Output 4050 ml  Net -3302.38 ml   Filed Weights   05/06/19 0545 05/07/19 0640 05/08/19 0355  Weight: 121.2 kg 121.5 kg 120 kg    Examination: GENERAL: in no acute distress CARDIAC: heart RRR. +3 LE edema extending to pre-sacrum but is slightly improved from yesterday PULMONARY: able to speak is full sentences. Improved air movement. No wheezes appreciated. ABDOMEN: soft.  Nondistended.  SKIN: stasis dermatitis of bilateral lower extremities  Significant Diagnostic Tests:  CXR: Diffuse hazy opacities bilaterally.  Cardiomegaly present  Labs    CBC Latest Ref Rng & Units 05/07/2019 05/06/2019 05/05/2019  WBC 4.0 - 10.5 K/uL 4.5 4.8 -  Hemoglobin 12.0 - 15.0 g/dL 8.4(L) 7.9(L) 9.5(L)  Hematocrit 36.0 - 46.0 % 28.5(L) 26.2(L) 28.0(L)  Platelets 150 - 400 K/uL 236 211 -   BMP Latest Ref Rng & Units 05/08/2019 05/07/2019 05/06/2019  Glucose 70 - 99 mg/dL 211(H) 92 226(H)  BUN 8 - 23 mg/dL 32(H) 25(H) 21  Creatinine 0.44 - 1.00 mg/dL 2.17(H) 1.95(H) 1.60(H)  Sodium 135 - 145 mmol/L 142 141 140  Potassium 3.5 - 5.1 mmol/L 4.3 4.0 3.8  Chloride 98 - 111 mmol/L 100 104 103  CO2 22 - 32 mmol/L 27 27 27   Calcium 8.9 - 10.3 mg/dL 9.0 8.5(L) 8.3(L)   Iron panel: iron 37, TIBC 244, 15% saturation, UIBC 207, ferritin 112 Summary  64 yo chronically ill female with combined heart failure (EF 30-35%) and COPD who presented to [facility] with 1w history of progressive  edema and shortness of breath and found to be in a heart failure exacerbation.  Assessment & Plan:  Active Problems:   Acute exacerbation of CHF (congestive heart failure) (HCC)  NYHA Class III Combined Heart Failure Exacerbation Presenting weight 122 kg. Reported baseline about 105-110kg Down 1.8kg from admission  Daily Net output -3.3L. Total net: -3.5L Excellent output yesterday with increase in lasix and one dose of metolazone. Will monitor output throughout today and can repeat another dose of metolazone if she's not having as good of output. Plan Continue 120mg  Lasix BID IV Continue imdur. Will increase spironolactone to 25mg . Will continue to hold hydralazine. Daily weights, strict I/Os, fluid restriction 1561mL, Tele monitoring  Chronic COPD with chronic respiratory failure. Suspected exacerbation.  [place] 2L supplemental O2 requirement Back up to 3L [place]. She reports some increased shortness of breath overnight -Not entirely convinced that a COPD exacerbation is playing a role however would suspect that her shortness of breath would be improving either way, whether this was COPD or heart failure related, since she has been diuresing well and is on day #3 of prednisone. If her O2 requirements continue to increase, she may benefit from some additional imaging. -Likely a component of OHS as well and patient would benefit from outpatient sleep study. Plan: duonebs qid, incruse, dulera. Continue prednisone--day #3/5  Maintain O2 sats 88-92%. If sats are >92%, turn down  O2. Call primary team if there are any questions or concerns regarding this.  Chronic Hypertension with history of CVA Blood pressures stable Plan: Continue home dose of metoprolol 100 mg. Spironolactone started 05/06/19. Will continue to hold hydralazine. Continue Plavix and statin.  T2DM. Blood sugars somewhat elevated since yesterday. SSI with HS coverage + 50U Lantus BID. May need to consider increasing lantus until  prednisone is complete  CKD III: bump in renal function suspected as result of diuresis. 1.6>1.95>2.17. Bicarb stable. will need to keep a close eye on this with daily labs  Borderline Iron Deficency Anemia. Hgb at baseline. Iron panel borderline iron deficient. Received ferriheme infusion on 3/3  Best practice:  CODE STATUS: DNR Diet: cardiac, diabetic, 1520mL fluid restriction DVT for prophylaxis: lovenox Dispo: pending diuresis and clinical improvement   Mitzi Hansen, MD INTERNAL MEDICINE RESIDENT PGY-1 PAGER #: [phone] 05/08/19  9:36 AM

## 2019-05-08 NOTE — Progress Notes (Signed)
Inpatient Diabetes Program Recommendations  AACE/ADA: New Consensus Statement on Inpatient Glycemic Control (2015)  Target Ranges:  Prepandial:   less than 140 mg/dL      Peak postprandial:   less than 180 mg/dL (1-2 hours)      Critically ill patients:  140 - 180 mg/dL   Lab Results  Component Value Date   GLUCAP 227 (H) 05/08/2019   HGBA1C 10.0 (H) 05/05/2019    Review of Glycemic Control Results for Christine Sims, Christine Sims (MRN [id number]) as of 05/08/2019 13:13  Ref. Range 05/07/2019 16:01 05/07/2019 20:50 05/08/2019 06:05 05/08/2019 07:49 05/08/2019 12:17  Glucose-Capillary Latest Ref Range: 70 - 99 mg/dL 328 (H) 201 (H) 187 (H) 133 (H) 227 (H)   Diabetes history: Type 2 DM Outpatient Diabetes medications: Novolog 30 units TID, Lantus 60 units BID Current orders for Inpatient glycemic control: Lantus 50 units BID, Novolog 0-20 units TID, Novolog 0-5 units QHS, Novolog 5 units TID Prednisone 40 mg QAM  Inpatient Diabetes Program Recommendations:    If patient continues to have increased post prandials, consider increasing Novolog to 7 units TID (assuming patient is consuming >50% of meals).     Patient needs a meter at discharge.  MD please order at discharge:  -Accu-chek Aviva Plus with device kit [phone]) -Accu-chek Aviva Plus VI strip (#[id number])  Thanks, Bronson Curb, MSN, RNC-OB Diabetes Coordinator [phone] (8a-5p)

## 2019-05-08 NOTE — Progress Notes (Signed)
Pt is currently sitting in recliner, alert and oriented.  RN has reviewed safety/fall precautions with the client.  Pt stated, that she is unable to sleep in the bed and refusing chair or bed alarm during this time.

## 2019-05-08 NOTE — Progress Notes (Signed)
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 Admitted for acute exacerbation of CHF.    OT comments  Patient agreeable to OT, needing to use bedside commode upon arrival. Patient supervision for safety with managing O2 lines, without AD. Patient require mod A for hygiene/clothing management, able to perform hygiene herself but requires assist for donning clean brief. Max A for LB bathing and dressing seated in recliner, cue patient for pursed lip breathing due to dyspnea with minimal activity. Will continue to follow.    Follow Up Recommendations  Home health OT;Supervision/Assistance - 24 hour    Equipment Recommendations  None recommended by OT       Precautions / Restrictions Precautions Precautions: Fall Precaution Comments: watch O2, per RN SpO2 should be between 88-92% Restrictions Weight Bearing Restrictions: No       Mobility Bed Mobility               General bed mobility comments: seated in recliner upon arrival  Transfers Overall transfer level: Modified independent Equipment used: None Transfers: Sit to/from Stand Sit to Stand: Supervision         General transfer comment: supervision for safety    Balance Overall balance assessment: Needs assistance Sitting-balance support: No upper extremity supported Sitting balance-Leahy Scale: Good Sitting balance - Comments: mod I   Standing balance support: No upper extremity supported;During functional activity Standing balance-Leahy Scale: Fair Standing balance comment: able to ambulate without AD, however reaching for commode/recliner arms during  transfers                           ADL either performed or assessed with clinical judgement   ADL Overall ADL's : Needs assistance/impaired             Lower Body Bathing: Maximal assistance;Sitting/lateral leans Lower Body Bathing Details (indicate cue type and reason): unable to reach B LEs/feet     Lower Body Dressing: Total assistance;Sitting/lateral leans Lower Body Dressing Details (indicate cue type and reason): to don clean socks Toilet Transfer: Supervision/safety;Cueing for safety;BSC Toilet Transfer Details (indicate cue type and reason): cues for safety managing O2 lines with transfer to/from bedside commode Toileting- Clothing Manipulation and Hygiene: Moderate assistance;Sitting/lateral lean;Sit to/from stand Toileting - Clothing Manipulation Details (indicate cue type and reason): patient able to perform peri care after voiding, require assist to change into clean brief     Functional mobility during ADLs: Supervision/safety;Cueing for safety General ADL Comments: cues for pursed lip breathing, labored breathing with minimal activity               Cognition Arousal/Alertness: Awake/alert Behavior During Therapy: WFL for tasks assessed/performed;Impulsive Overall Cognitive Status: Within Functional Limits for tasks assessed                                                     Pertinent Vitals/ Pain       Pain Assessment: No/denies pain  Frequency  Min 2X/week        Progress Toward Goals  OT Goals(current goals can now be found in the care plan section)  Progress towards OT goals: Progressing toward goals  Acute Rehab OT Goals Patient Stated Goal: " to get rid of this belly." OT Goal Formulation: With patient Time For Goal Achievement: 05/20/19 Potential to Achieve Goals: Good ADL Goals Pt Will Perform Lower Body Dressing: with supervision;sit to/from stand;with adaptive equipment Pt Will Transfer to  Toilet: with modified independence;ambulating Pt Will Perform Toileting - Clothing Manipulation and hygiene: with modified independence;sitting/lateral leans;sit to/from stand Additional ADL Goal #1: Pt will demonstrate increased activity tolerance by completing 3 consecutive grooming tasks at sink without rest breaks. Additional ADL Goal #2: Pt will demonstrate use of 2 energy conservation techniques during ADL routine with independence.  Plan Discharge plan remains appropriate       AM-PAC OT "6 Clicks" Daily Activity     Outcome Measure   Help from another person eating meals?: None Help from another person taking care of personal grooming?: A Little Help from another person toileting, which includes using toliet, bedpan, or urinal?: A Lot Help from another person bathing (including washing, rinsing, drying)?: A Lot Help from another person to put on and taking off regular upper body clothing?: None Help from another person to put on and taking off regular lower body clothing?: A Lot 6 Click Score: 17    End of Session Equipment Utilized During Treatment: Oxygen  OT Visit Diagnosis: Other abnormalities of gait and mobility (R26.89);Muscle weakness (generalized) (M62.81)   Activity Tolerance Patient tolerated treatment well   Patient Left in chair;with call bell/phone within reach;with nursing/sitter in room   Nurse Communication Mobility status        Time: [id number] OT Time Calculation (min): 14 min  Charges: OT General Charges $OT Visit: 1 Visit OT Treatments $Self Care/Home Management : 8-22 mins  Shon Millet OT OT office: [place] 05/08/2019, 12:54 PM

## 2019-05-08 NOTE — Progress Notes (Signed)
Patient refused to wear CPAP. 

## 2019-05-09 DIAGNOSIS — E1165 Type 2 diabetes mellitus with hyperglycemia: Secondary | ICD-10-CM | POA: Diagnosis present

## 2019-05-09 DIAGNOSIS — E8770 Fluid overload, unspecified: Secondary | ICD-10-CM

## 2019-05-09 LAB — BASIC METABOLIC PANEL
Anion gap: 12 (ref 5–15)
BUN: 37 mg/dL — ABNORMAL HIGH (ref 8–23)
CO2: 31 mmol/L (ref 22–32)
Calcium: 9 mg/dL (ref 8.9–10.3)
Chloride: 96 mmol/L — ABNORMAL LOW (ref 98–111)
Creatinine, Ser: 2.14 mg/dL — ABNORMAL HIGH (ref 0.44–1.00)
GFR calc Af Amer: 28 mL/min — ABNORMAL LOW (ref >60)
GFR calc non Af Amer: 24 mL/min — ABNORMAL LOW (ref >60)
Glucose, Bld: 186 mg/dL — ABNORMAL HIGH (ref 70–99)
Potassium: 4 mmol/L (ref 3.5–5.1)
Sodium: 139 mmol/L (ref 135–145)

## 2019-05-09 LAB — CBC
HCT: 27.6 % — ABNORMAL LOW (ref 36.0–46.0)
Hemoglobin: 8.2 g/dL — ABNORMAL LOW (ref 12.0–15.0)
MCH: 25.4 pg — ABNORMAL LOW (ref 26.0–34.0)
MCHC: 29.7 g/dL — ABNORMAL LOW (ref 30.0–36.0)
MCV: 85.4 fL (ref 80.0–100.0)
Platelets: 298 10*3/uL (ref 150–400)
RBC: 3.23 MIL/uL — ABNORMAL LOW (ref 3.87–5.11)
RDW: 17.7 % — ABNORMAL HIGH (ref 11.5–15.5)
WBC: 6 10*3/uL (ref 4.0–10.5)
nRBC: 0 % (ref 0.0–0.2)

## 2019-05-09 LAB — GLUCOSE, CAPILLARY
Glucose-Capillary: 148 mg/dL — ABNORMAL HIGH (ref 70–99)
Glucose-Capillary: 357 mg/dL — ABNORMAL HIGH (ref 70–99)
Glucose-Capillary: 404 mg/dL — ABNORMAL HIGH (ref 70–99)
Glucose-Capillary: 342 mg/dL — ABNORMAL HIGH (ref 70–99)

## 2019-05-09 MED ORDER — FUROSEMIDE 10 MG/ML IJ SOLN
120.0000 mg | Freq: Two times a day (BID) | INTRAVENOUS | Status: AC
  Administered 2019-05-09 – 2019-05-10 (×4): 120 mg via INTRAVENOUS
  Filled 2019-05-09: qty 12
  Filled 2019-05-09: qty 10
  Filled 2019-05-09 (×2): qty 12

## 2019-05-09 MED ORDER — IPRATROPIUM-ALBUTEROL 0.5-2.5 (3) MG/3ML IN SOLN
3.0000 mL | Freq: Two times a day (BID) | RESPIRATORY_TRACT | Status: DC
  Administered 2019-05-09 – 2019-05-13 (×10): 3 mL via RESPIRATORY_TRACT
  Filled 2019-05-09 (×9): qty 3

## 2019-05-09 MED ORDER — INSULIN ASPART 100 UNIT/ML ~~LOC~~ SOLN: 9.0000 [IU] | Freq: Three times a day (TID) | SUBCUTANEOUS | Status: DC

## 2019-05-09 MED ORDER — INSULIN ASPART 100 UNIT/ML ~~LOC~~ SOLN
10.0000 [IU] | Freq: Three times a day (TID) | SUBCUTANEOUS | Status: DC
  Administered 2019-05-09 – 2019-05-14 (×14): 10 [IU] via SUBCUTANEOUS

## 2019-05-09 NOTE — Progress Notes (Signed)
NAME:  JAZZALYNN RHUDY, MRN:  [id number], DOB:  03-May-1955, LOS: 4 ADMISSION DATE:  05/05/2019   Subjective/Interm history  No overnight events VSS Is slowly feeling better with volume removal  Objective   Blood pressure 129/68, pulse 90, temperature 98 F (36.7 C), temperature source Oral, resp. rate 20, height 5\' 4"  (1.626 m), weight 118.8 kg, SpO2 99 %.     Intake/Output Summary (Last 24 hours) at 05/09/2019 1136 Last data filed at 05/09/2019 0957 Gross per 24 hour  Intake 1524 ml  Output 2850 ml  Net -1326 ml   Filed Weights   05/07/19 0640 05/08/19 0355 05/09/19 0535  Weight: 121.5 kg 120 kg 118.8 kg    Examination: GENERAL: in no acute distress CARDIAC: heart RRR. Lower extremities still have significant edema but are slowly improving PULMONARY: able to speak is full sentences. Lungs clear ABDOMEN: soft.  Nondistended.  SKIN: stasis dermatitis of bilateral lower extremities. Intact dressing to   Significant Diagnostic Tests:  CXR: Diffuse hazy opacities bilaterally.  Cardiomegaly present  Labs    CBC Latest Ref Rng & Units 05/09/2019 05/07/2019 05/06/2019  WBC 4.0 - 10.5 K/uL 6.0 4.5 4.8  Hemoglobin 12.0 - 15.0 g/dL 8.2(L) 8.4(L) 7.9(L)  Hematocrit 36.0 - 46.0 % 27.6(L) 28.5(L) 26.2(L)  Platelets 150 - 400 K/uL 298 236 211   BMP Latest Ref Rng & Units 05/09/2019 05/08/2019 05/07/2019  Glucose 70 - 99 mg/dL 186(H) 211(H) 92  BUN 8 - 23 mg/dL 37(H) 32(H) 25(H)  Creatinine 0.44 - 1.00 mg/dL 2.14(H) 2.17(H) 1.95(H)  Sodium 135 - 145 mmol/L 139 142 141  Potassium 3.5 - 5.1 mmol/L 4.0 4.3 4.0  Chloride 98 - 111 mmol/L 96(L) 100 104  CO2 22 - 32 mmol/L 31 27 27   Calcium 8.9 - 10.3 mg/dL 9.0 9.0 8.5(L)   Summary  64 yo chronically ill female with combined heart failure (EF 30-35%) and COPD who presented to [facility] with 1w history of progressive edema and shortness of breath and found to be in a heart failure exacerbation.  Assessment & Plan:  Active Problems:   Acute  exacerbation of CHF (congestive heart failure) (HCC)  NYHA Class III Combined Heart Failure Exacerbation Presenting weight 122 kg. Reported baseline about 105-110kg Down 3kg from admission  Daily Net output -0.9L. Total net: -4.8L Still remains very hypervolemic but is slowly improving and will likely need a few more days of high dose diuresis pending renal function. Plan Continue 120mg  Lasix BID IV Continue imdur. spironolactone 25mg . Will continue to hold hydralazine. Daily weights, strict I/Os, fluid restriction 1559mL, Tele monitoring  Chronic COPD with chronic respiratory failure. Suspected exacerbation.  Baesline 2L supplemental O2 requirement Remains on 3L [place]. Denies shortness of breath -Not entirely convinced that a COPD exacerbation is playing a role however would suspect that her shortness of breath would be improving either way, whether this was COPD or heart failure related, since she has been diuresing well and is on day #3 of prednisone. If her O2 requirements continue to increase, she may benefit from some additional imaging. -Likely a component of OHS as well and patient would benefit from outpatient sleep study. Plan: duonebs qid, incruse, dulera. Continue prednisone--day #4/5  Maintain O2 sats 88-92%. If sats are >92%, turn down O2. Call primary team if there are any questions or concerns regarding this.  Chronic Hypertension with history of CVA Blood pressures stable Plan: Continue home dose of metoprolol 100 mg. Spironolactone started 05/06/19. Will continue to hold hydralazine.  Continue Plavix and statin.  T2DM.  SSI +meal +HS coverage + 50U Lantus BID. Will continue to monitor  CKD III: bump in renal function suspected as result of diuresis and would suspect it will improve once done with high dose diuretics. Stable from yesterday. Will continue to monitor with daily labs.  Borderline Iron Deficency Anemia. Hgb stable and at baseline. Iron panel borderline iron  deficient. Received ferriheme infusion on 3/3  Best practice:  CODE STATUS: DNR Diet: cardiac, diabetic, 158mL fluid restriction DVT for prophylaxis: lovenox Dispo: pending diuresis and clinical improvement   Mitzi Hansen, MD INTERNAL MEDICINE RESIDENT PGY-1 PAGER #: [phone] 05/09/19  11:36 AM

## 2019-05-09 NOTE — Progress Notes (Signed)
Inpatient Diabetes Program Recommendations  AACE/ADA: New Consensus Statement on Inpatient Glycemic Control (2015)  Target Ranges:  Prepandial:   less than 140 mg/dL      Peak postprandial:   less than 180 mg/dL (1-2 hours)      Critically ill patients:  140 - 180 mg/dL   Lab Results  Component Value Date   GLUCAP 357 (H) 05/09/2019   HGBA1C 10.0 (H) 05/05/2019    Review of Glycemic Control Results for Boal, Karmon O (MRN [id number]) as of 05/09/2019 13:38  Ref. Range 05/08/2019 21:27 05/09/2019 06:04 05/09/2019 11:12  Glucose-Capillary Latest Ref Range: 70 - 99 mg/dL 202 (H) 148 (H) 357 (H)   Diabetes history:Type 2 DM Outpatient Diabetes medications:Novolog 30 units TID, Lantus 60 units BID Current orders for Inpatient glycemic control:Lantus 50 units BID, Novolog 0-20 units TID, Novolog 0-5 units QHS, Novolog 7 units TID Prednisone 40 mg QAM  Inpatient Diabetes Program Recommendations:  If patient continues to have increased post prandials, consider increasing Novolog to 10 units TID (assuming patient is consuming >50% of meals).   Patient needs a meter at discharge.  MD please order at discharge:  -Accu-chek Aviva Plus with device kit (#[id number]) -Accu-chek Aviva Plus VI strip (#[id number])  Thanks,  Lauren McDaniel, MSN, RNC-OB Diabetes Coordinator [phone] (8a-5p)  

## 2019-05-09 NOTE — Progress Notes (Signed)
Pt currently sitting in recliner.  Pt refuses bed/chair alarm during this time.  Pt verbalized, the safety risk of falls.

## 2019-05-09 NOTE — Progress Notes (Signed)
RN received patient during shift change sitting in recliner. RN observed chair alarm not on. RN offered to set chair alarm to ensure safety;patient declined chair alarm. Educated patient on fall safety.   1430 paged Int Med. Upon callback, informed patient requested HS snack. Diet order updated to include HS snack.  RN called dietary and requested patient's preference for HS snack.   1618 called RT for PRN Duoneb for wheezing.   Paged Int Med at 1639 to inform of CBG of 404.   Paged Int Med at 1704. Upon callback at 1711 from MD Dorian Pod;  Informed MD patient CBG of 404 and requested dosing direction for sliding scale/correction insulin to be given. Informed to give 30 units Novolog and recheck CBG within in 2 hours. Night RN informed to recheck CBG.

## 2019-05-09 NOTE — Progress Notes (Signed)
Pt remains on recliner chair, she prefers   To Sleep on the chair. Emphasized about the fall precautions however, pt insisted not to turn on the chair alarm.  Yellow socks in place, encouraged to call for assistance. Will check often.

## 2019-05-09 NOTE — Progress Notes (Signed)
PT Cancellation Note  Patient Details Name: Christine Sims MRN: [id number] DOB: 1955/04/09   Cancelled Treatment:    Reason Eval/Treat Not Completed: Patient declined, no reason specified Attempted to see pt once in AM but she declined asking for PT to return in PM and then declined in PM due to being tired. Will follow.   Marguarite Arbour A Alixandra Alfieri 05/09/2019, 1:43 PM Marisa Severin, PT, DPT Acute Rehabilitation Services Pager [phone] Office [phone]

## 2019-05-09 NOTE — Progress Notes (Signed)
Patient requested treatment - BS Expiratory Wheezing and Coarseness.

## 2019-05-10 DIAGNOSIS — Z955 Presence of coronary angioplasty implant and graft: Secondary | ICD-10-CM

## 2019-05-10 DIAGNOSIS — J9601 Acute respiratory failure with hypoxia: Secondary | ICD-10-CM

## 2019-05-10 DIAGNOSIS — I110 Hypertensive heart disease with heart failure: Secondary | ICD-10-CM

## 2019-05-10 DIAGNOSIS — E119 Type 2 diabetes mellitus without complications: Secondary | ICD-10-CM

## 2019-05-10 DIAGNOSIS — I2510 Atherosclerotic heart disease of native coronary artery without angina pectoris: Secondary | ICD-10-CM

## 2019-05-10 LAB — GLUCOSE, CAPILLARY
Glucose-Capillary: 105 mg/dL — ABNORMAL HIGH (ref 70–99)
Glucose-Capillary: 275 mg/dL — ABNORMAL HIGH (ref 70–99)
Glucose-Capillary: 413 mg/dL — ABNORMAL HIGH (ref 70–99)
Glucose-Capillary: 355 mg/dL — ABNORMAL HIGH (ref 70–99)

## 2019-05-10 LAB — BASIC METABOLIC PANEL
Anion gap: 12 (ref 5–15)
BUN: 38 mg/dL — ABNORMAL HIGH (ref 8–23)
CO2: 32 mmol/L (ref 22–32)
Calcium: 9.4 mg/dL (ref 8.9–10.3)
Chloride: 97 mmol/L — ABNORMAL LOW (ref 98–111)
Creatinine, Ser: 2.03 mg/dL — ABNORMAL HIGH (ref 0.44–1.00)
GFR calc Af Amer: 30 mL/min — ABNORMAL LOW (ref >60)
GFR calc non Af Amer: 26 mL/min — ABNORMAL LOW (ref >60)
Glucose, Bld: 112 mg/dL — ABNORMAL HIGH (ref 70–99)
Potassium: 3.8 mmol/L (ref 3.5–5.1)
Sodium: 141 mmol/L (ref 135–145)

## 2019-05-10 MED ORDER — HYDRALAZINE HCL 50 MG PO TABS
50.0000 mg | ORAL_TABLET | Freq: Three times a day (TID) | ORAL | Status: DC
  Administered 2019-05-10: 50 mg via ORAL
  Filled 2019-05-10: qty 1

## 2019-05-10 MED ORDER — HYDRALAZINE HCL 25 MG PO TABS
25.0000 mg | ORAL_TABLET | Freq: Three times a day (TID) | ORAL | Status: DC
  Administered 2019-05-10 – 2019-05-14 (×10): 25 mg via ORAL
  Filled 2019-05-10 (×10): qty 1

## 2019-05-10 NOTE — Progress Notes (Addendum)
   Subjective:   She is urinating a lot. She feels that her SOB has improved but that she still has a lot of fluid.   Objective:  Vital signs in last 24 hours: Vitals:   05/10/19 0319 05/10/19 0323 05/10/19 0808 05/10/19 0812  BP: (!) 143/79  (!) 145/79   Pulse: 86  78 80  Resp: 20  (!) 21 20  Temp: 97.8 F (36.6 C)  98.1 F (36.7 C)   TempSrc: Oral  Oral   SpO2: 100%  100% 100%  Weight:  117.1 kg    Height:        Constitution: NAD, obese appears stated age 64: RRR, no m/r/g, +2 pitting edema Respiratory: upper airway wheezing, otherwise CTA MSK: moving all extremities Neuro: normal affect, a&ox3 Skin: LE skin tears 2/2 edema w/serous drainage   Assessment/Plan:  Principal Problem:   Acute exacerbation of CHF (congestive heart failure) (HCC) Active Problems:   COPD with acute exacerbation (HCC)   HTN (hypertension)   CKD (chronic kidney disease) stage 3, GFR 30-59 ml/min   Uncontrolled type 2 diabetes mellitus with hyperglycemia (HCC)   Morbid obesity ([place])  64 year old female with HFrEF, type 2 diabetes, and COPD on chronic supplemental 2 L oxygen presenting with heart failure exacerbation improving with diuresis.  Acute systolic heart failure exacerbation Last EF 30-35% 04/17/19. Continue to diurese, net -7.1L, -2.2 L off overnight.  Renal function stable.  Weight down from 268 at admission to 258 pounds today. Goal weight ~240.   -Continue 120 Lasix IV twice daily -Continue spironolactone 25 mg daily - cont. Metoprolol  -Monitor weight, strict I's and O's  COPD with chronic respiratory failure on 2 L supplemental O2 Initially thought of possible COPD exacerbation as well as heart failure exacerbation.  She is on day 4 of prednisone, will discontinue at this time as this appears to mostly be heart failure exacerbation and glucose continues to be significantly elevated despite max Lantus dosing.  - cont. duonebs prn  - maintain O2 sats >88% - stop  prednisone today  - needs outpatient sleep study although declining bipap while admitted. This has been discontinued by respiratory  - cont. Incurse, dulera   TIIDM Glucose continues to be uncontrolled, likely 2/2 steroid requirement, which can be discontinued at this time.   - stop steroids - cont. lantus 50U bid  - SSI resistant - 10U tid with meals   HTN - Cont home imdur.  - restart hydral today   CAD S/p drug eluting stent 04/2018.   - cont. Plavix, statin   VTE: high dose lovenox IVF: none Diet: CM/HH Code: full   Dispo: Anticipated discharge pending adequate diuresis.    Merri Dimaano A, DO 05/10/2019, 11:18 AM Pager: [phone]

## 2019-05-10 NOTE — Progress Notes (Signed)
Received patient during shift change in recliner; chair alarm not set. Re-educated patient on fall safety and informed of need for chair alarm. Patient declined chair alarm and stated will call staff for assistance.  1713 paged Int Med. Upon callback informed of CBG 413; Co[place] confirmed ok to give 20 units Novolog sliding scale and 10 units meal coverage as ordered.

## 2019-05-11 DIAGNOSIS — J9620 Acute and chronic respiratory failure, unspecified whether with hypoxia or hypercapnia: Secondary | ICD-10-CM | POA: Diagnosis present

## 2019-05-11 LAB — GLUCOSE, CAPILLARY
Glucose-Capillary: 203 mg/dL — ABNORMAL HIGH (ref 70–99)
Glucose-Capillary: 233 mg/dL — ABNORMAL HIGH (ref 70–99)
Glucose-Capillary: 107 mg/dL — ABNORMAL HIGH (ref 70–99)
Glucose-Capillary: 180 mg/dL — ABNORMAL HIGH (ref 70–99)

## 2019-05-11 LAB — CBC
HCT: 30 % — ABNORMAL LOW (ref 36.0–46.0)
Hemoglobin: 8.9 g/dL — ABNORMAL LOW (ref 12.0–15.0)
MCH: 25.3 pg — ABNORMAL LOW (ref 26.0–34.0)
MCHC: 29.7 g/dL — ABNORMAL LOW (ref 30.0–36.0)
MCV: 85.2 fL (ref 80.0–100.0)
Platelets: 348 10*3/uL (ref 150–400)
RBC: 3.52 MIL/uL — ABNORMAL LOW (ref 3.87–5.11)
RDW: 17.8 % — ABNORMAL HIGH (ref 11.5–15.5)
WBC: 6.9 10*3/uL (ref 4.0–10.5)
nRBC: 0.4 % — ABNORMAL HIGH (ref 0.0–0.2)

## 2019-05-11 LAB — BASIC METABOLIC PANEL
Anion gap: 11 (ref 5–15)
BUN: 38 mg/dL — ABNORMAL HIGH (ref 8–23)
CO2: 33 mmol/L — ABNORMAL HIGH (ref 22–32)
Calcium: 9 mg/dL (ref 8.9–10.3)
Chloride: 94 mmol/L — ABNORMAL LOW (ref 98–111)
Creatinine, Ser: 2.11 mg/dL — ABNORMAL HIGH (ref 0.44–1.00)
GFR calc Af Amer: 28 mL/min — ABNORMAL LOW (ref >60)
GFR calc non Af Amer: 24 mL/min — ABNORMAL LOW (ref >60)
Glucose, Bld: 280 mg/dL — ABNORMAL HIGH (ref 70–99)
Potassium: 4.3 mmol/L (ref 3.5–5.1)
Sodium: 138 mmol/L (ref 135–145)

## 2019-05-11 LAB — MAGNESIUM: Magnesium: 2.4 mg/dL (ref 1.7–2.4)

## 2019-05-11 MED ORDER — FUROSEMIDE 10 MG/ML IJ SOLN: 120.0000 mg | Freq: Two times a day (BID) | INTRAVENOUS | Status: DC

## 2019-05-11 MED ORDER — SENNOSIDES-DOCUSATE SODIUM 8.6-50 MG PO TABS
1.0000 | ORAL_TABLET | Freq: Every day | ORAL | Status: DC
  Administered 2019-05-11 – 2019-05-14 (×4): 1 via ORAL
  Filled 2019-05-11 (×4): qty 1

## 2019-05-11 MED ORDER — FUROSEMIDE 10 MG/ML IJ SOLN
120.0000 mg | Freq: Two times a day (BID) | INTRAVENOUS | Status: AC
  Administered 2019-05-11 (×2): 120 mg via INTRAVENOUS
  Filled 2019-05-11: qty 12
  Filled 2019-05-11: qty 2

## 2019-05-11 NOTE — Progress Notes (Signed)
NAME:  TIAHNA CURE, MRN:  [id number], DOB:  [birth year], LOS: 6 ADMISSION DATE:  05/05/2019   Subjective/Interm history  No overnight events Continues to endorse symptomatic improvement   Objective   Blood pressure (!) 142/83, pulse 78, temperature 97.6 F (36.4 C), temperature source Oral, resp. rate 19, height 5\' 4"  (1.626 m), weight 116.8 kg, SpO2 100 %.     Intake/Output Summary (Last 24 hours) at 05/11/2019 0532 Last data filed at 05/11/2019 0427 Gross per 24 hour  Intake 1220 ml  Output 2550 ml  Net -1330 ml   Filed Weights   05/09/19 0535 05/10/19 0323 05/11/19 0425  Weight: 118.8 kg 117.1 kg 116.8 kg    Examination: GENERAL: in no acute distress CARDIAC: heart RRR. LE edema slowly improving PULMONARY: supplemental O2 via [place]. Lung sounds distant but clear. ABDOMEN: soft.  Nondistended.  SKIN: stasis dermatitis of bilateral lower extremities.dressing intact to the bilateral LE wounds  Labs    CBC Latest Ref Rng & Units 05/11/2019 05/09/2019 05/07/2019  WBC 4.0 - 10.5 K/uL 6.9 6.0 4.5  Hemoglobin 12.0 - 15.0 g/dL 8.9(L) 8.2(L) 8.4(L)  Hematocrit 36.0 - 46.0 % 30.0(L) 27.6(L) 28.5(L)  Platelets 150 - 400 K/uL 348 298 236   BMP Latest Ref Rng & Units 05/11/2019 05/10/2019 05/09/2019  Glucose 70 - 99 mg/dL 280(H) 112(H) 186(H)  BUN 8 - 23 mg/dL 38(H) 38(H) 37(H)  Creatinine 0.44 - 1.00 mg/dL 2.11(H) 2.03(H) 2.14(H)  Sodium 135 - 145 mmol/L 138 141 139  Potassium 3.5 - 5.1 mmol/L 4.3 3.8 4.0  Chloride 98 - 111 mmol/L 94(L) 97(L) 96(L)  CO2 22 - 32 mmol/L 33(H) 32 31  Calcium 8.9 - 10.3 mg/dL 9.0 9.4 9.0   Summary  64 yo chronically ill female with 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  Assessment & Plan:  Principal Problem:   Acute exacerbation of CHF (congestive heart failure) (HCC) Active Problems:   COPD with acute exacerbation  (HCC)   HTN (hypertension)   CKD (chronic kidney disease) stage 3, GFR 30-59 ml/min   Uncontrolled type 2 diabetes mellitus with hyperglycemia (HCC)   Morbid obesity (HCC)  NYHA Class III Combined Heart Failure Exacerbation Presenting weight 122 kg. Reported baseline about 105-110kg Down 5kg from admission  Daily Net output -1.3L. Total net: -8L uptrending bicarb possibly indicating nearing end of diuresis. Other electrolytes remain stable. Plan Continue 120mg  Lasix BID IV Continue imdur. spironolactone 25mg . 25mg  hydralazine tid restarted 3/6 Daily weights, strict I/Os, fluid restriction 1517mL, Tele monitoring  Chronic COPD with chronic respiratory failure. Suspected exacerbation.  Baesline 2L supplemental O2 requirement Remains on 3L [place].  Completed 5d course of prednisone -Likely a component of OHS as well and patient would benefit from outpatient sleep study. Plan: duonebs qid, incruse, dulera.   Maintain O2 sats 88-92%. If sats are >92%, turn down O2. Call primary team if there are any questions or concerns regarding this.  Chronic Hypertension with history of CVA Blood pressures stable Plan: Continue home dose of metoprolol 100 mg. Spironolactone started 05/06/19. Hydral restarted 3/6. Continue Plavix and statin.  T2DM.  Blood sugars fairly elevated yesterday--suspect this to be from prednisone which is now completed. Lasix may also be contributing. SSI +meal +HS coverage + 50U Lantus BID. Will continue to monitor  CKD III: renal function remains elevated from baseline however I suspect  this will improve after completing high intensity diuresis.   Borderline Iron Deficency Anemia. Hgb stable and at baseline. Received ferriheme infusion on 3/3  Best practice:  CODE STATUS: DNR Diet: cardiac, diabetic, 1513mL fluid restriction DVT for prophylaxis: lovenox Dispo: pending diuresis and clinical improvement   Mitzi Hansen, MD INTERNAL MEDICINE RESIDENT PGY-1 PAGER #:  [phone] 05/11/19  5:32 AM

## 2019-05-11 NOTE — Plan of Care (Signed)
  Problem: Education: Goal: Knowledge of General Education information will improve Description: Including pain rating scale, medication(s)/side effects and non-pharmacologic comfort measures Outcome: Progressing   Problem: Health Behavior/Discharge Planning: Goal: Ability to manage health-related needs will improve Outcome: Progressing   Problem: Clinical Measurements: Goal: Ability to maintain clinical measurements within normal limits will improve Outcome: Progressing Goal: Will remain free from infection Outcome: Progressing Goal: Diagnostic test results will improve Outcome: Progressing Goal: Respiratory complications will improve Outcome: Progressing Goal: Cardiovascular complication will be avoided Outcome: Progressing   Problem: Activity: Goal: Risk for activity intolerance will decrease Outcome: Progressing   Problem: Nutrition: Goal: Adequate nutrition will be maintained Outcome: Progressing   Problem: Coping: Goal: Level of anxiety will decrease Outcome: Progressing   Problem: Elimination: Goal: Will not experience complications related to bowel motility Outcome: Progressing Goal: Will not experience complications related to urinary retention Outcome: Progressing   Problem: Pain Managment: Goal: General experience of comfort will improve Outcome: Progressing   Problem: Safety: Goal: Ability to remain free from injury will improve Outcome: Progressing   Problem: Skin Integrity: Goal: Risk for impaired skin integrity will decrease Outcome: Progressing   Problem: Education: Goal: Ability to demonstrate management of disease process will improve Outcome: Progressing Goal: Ability to verbalize understanding of medication therapies will improve Outcome: Progressing Goal: Individualized Educational Video(s) Outcome: Progressing   Problem: Activity: Goal: Capacity to carry out activities will improve Outcome: Progressing   Problem: Cardiac: Goal:  Ability to achieve and maintain adequate cardiopulmonary perfusion will improve Outcome: Progressing   Problem: Education: Goal: Knowledge of General Education information will improve Description: Including pain rating scale, medication(s)/side effects and non-pharmacologic comfort measures Outcome: Progressing   Problem: Health Behavior/Discharge Planning: Goal: Ability to manage health-related needs will improve Outcome: Progressing   Problem: Clinical Measurements: Goal: Ability to maintain clinical measurements within normal limits will improve Outcome: Progressing Goal: Will remain free from infection Outcome: Progressing Goal: Diagnostic test results will improve Outcome: Progressing Goal: Respiratory complications will improve Outcome: Progressing Goal: Cardiovascular complication will be avoided Outcome: Progressing   Problem: Activity: Goal: Risk for activity intolerance will decrease Outcome: Progressing   Problem: Nutrition: Goal: Adequate nutrition will be maintained Outcome: Progressing   Problem: Coping: Goal: Level of anxiety will decrease Outcome: Progressing   Problem: Elimination: Goal: Will not experience complications related to bowel motility Outcome: Progressing Goal: Will not experience complications related to urinary retention Outcome: Progressing   Problem: Pain Managment: Goal: General experience of comfort will improve Outcome: Progressing   Problem: Safety: Goal: Ability to remain free from injury will improve Outcome: Progressing   Problem: Skin Integrity: Goal: Risk for impaired skin integrity will decrease Outcome: Progressing

## 2019-05-11 NOTE — Progress Notes (Signed)
Patient has been in the chair since shift change, she prefers to sit in the chair for most of the day. Patient refused chair alarm, yellow socks are on, and re-educated on fall safety. Will continue to monitor.

## 2019-05-12 DIAGNOSIS — J9610 Chronic respiratory failure, unspecified whether with hypoxia or hypercapnia: Secondary | ICD-10-CM

## 2019-05-12 LAB — BASIC METABOLIC PANEL
Anion gap: 13 (ref 5–15)
BUN: 47 mg/dL — ABNORMAL HIGH (ref 8–23)
CO2: 29 mmol/L (ref 22–32)
Calcium: 8.7 mg/dL — ABNORMAL LOW (ref 8.9–10.3)
Chloride: 96 mmol/L — ABNORMAL LOW (ref 98–111)
Creatinine, Ser: 2.19 mg/dL — ABNORMAL HIGH (ref 0.44–1.00)
GFR calc Af Amer: 27 mL/min — ABNORMAL LOW (ref >60)
GFR calc non Af Amer: 23 mL/min — ABNORMAL LOW (ref >60)
Glucose, Bld: 232 mg/dL — ABNORMAL HIGH (ref 70–99)
Potassium: 5.2 mmol/L — ABNORMAL HIGH (ref 3.5–5.1)
Sodium: 138 mmol/L (ref 135–145)

## 2019-05-12 LAB — GLUCOSE, CAPILLARY
Glucose-Capillary: 215 mg/dL — ABNORMAL HIGH (ref 70–99)
Glucose-Capillary: 253 mg/dL — ABNORMAL HIGH (ref 70–99)
Glucose-Capillary: 177 mg/dL — ABNORMAL HIGH (ref 70–99)
Glucose-Capillary: 174 mg/dL — ABNORMAL HIGH (ref 70–99)

## 2019-05-12 MED ORDER — PHENYLEPHRINE HCL 0.25 % NA SOLN
2.0000 | Freq: Four times a day (QID) | NASAL | Status: DC | PRN
  Administered 2019-05-13: 10:00:00 2 via NASAL
  Filled 2019-05-12: qty 15

## 2019-05-12 MED ORDER — FUROSEMIDE 10 MG/ML IJ SOLN
120.0000 mg | Freq: Two times a day (BID) | INTRAVENOUS | Status: AC
  Administered 2019-05-12 (×2): 120 mg via INTRAVENOUS
  Filled 2019-05-12 (×2): qty 12

## 2019-05-12 MED ORDER — SPIRONOLACTONE 12.5 MG HALF TABLET
12.5000 mg | ORAL_TABLET | Freq: Every day | ORAL | Status: DC
  Administered 2019-05-12 – 2019-05-14 (×3): 12.5 mg via ORAL
  Filled 2019-05-12 (×3): qty 1

## 2019-05-12 MED ORDER — SALINE SPRAY 0.65 % NA SOLN
1.0000 | NASAL | Status: DC | PRN
  Filled 2019-05-12: qty 44

## 2019-05-12 NOTE — Progress Notes (Signed)
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 Admitted for acute exacerbation of CHF.     PT Comments    Patient progressing slowly towards PT goals. Reports breathing is better than admission but still has dyspnea on exertion. Tolerated within room ambulation with use of RW for support; Sp02 remained >95% on 3L/min 02 [place]. No evidence of imbalance. Continues to demonstrate decreased activity tolerance and endurance. Will follow.   Follow Up Recommendations  Home health PT;Supervision - Intermittent     Equipment Recommendations  None recommended by PT    Recommendations for Other Services       Precautions / Restrictions Precautions Precautions: Fall Precaution Comments: watch O2, per RN SpO2 should be between 88-92% Restrictions Weight Bearing Restrictions: No    Mobility  Bed Mobility               General bed mobility comments: seated in recliner upon arrival  Transfers Overall transfer level: Modified independent Equipment used: None Transfers: Sit to/from Stand Sit to Stand: Modified independent (Device/Increase time)         General transfer comment: Stood from chair x1, from [facility] x1.  Ambulation/Gait Ambulation/Gait assistance: Supervision Gait Distance (Feet): 50 Feet(x2 bouts) Assistive device: Rolling walker (2 wheeled) Gait Pattern/deviations: Step-through pattern;Decreased stride length;Shuffle;Trunk flexed Gait velocity: decreased   General Gait Details: pt with slow but steady ambulation, 2/4 DOE. Sp02 remained in mid 90s on 3L/min 02 [place].   Stairs             Wheelchair  Mobility    Modified Rankin (Stroke Patients Only)       Balance Overall balance assessment: Needs assistance Sitting-balance support: Feet supported;No upper extremity supported Sitting balance-Leahy Scale: Good Sitting balance - Comments: mod I   Standing balance support: During functional activity Standing balance-Leahy Scale: Fair Standing balance comment: able to ambulate without AD, however reaching for commode/recliner arms during transfers, use of RW for in room ambulation                            Cognition Arousal/Alertness: Awake/alert Behavior During Therapy: WFL for tasks assessed/performed Overall Cognitive Status: Within Functional Limits for tasks assessed                                        Exercises      General Comments General comments (skin integrity, edema, etc.): SP02 >95% on 3L/min 02 [place].      Pertinent Vitals/Pain Pain Assessment: No/denies pain    Home Living                      Prior Function            PT Goals (current goals can now be found in the care plan section) Progress towards PT goals: Progressing toward goals    Frequency    Min 3X/week      PT Plan Current plan remains appropriate    Co-evaluation  AM-PAC PT "6 Clicks" Mobility   Outcome Measure  Help needed turning from your back to your side while in a flat bed without using bedrails?: None Help needed moving from lying on your back to sitting on the side of a flat bed without using bedrails?: None Help needed moving to and from a bed to a chair (including a wheelchair)?: None Help needed standing up from a chair using your arms (e.g., wheelchair or bedside chair)?: None Help needed to walk in hospital room?: None Help needed climbing 3-5 steps with a railing? : A Little 6 Click Score: 23    End of Session Equipment Utilized During Treatment: Oxygen Activity Tolerance: Patient tolerated treatment  well;Patient limited by fatigue Patient left: in chair;with call bell/phone within reach Nurse Communication: Mobility status PT Visit Diagnosis: Muscle weakness (generalized) (M62.81);Difficulty in walking, not elsewhere classified (R26.2)     Time: 1448-1500 PT Time Calculation (min) (ACUTE ONLY): 12 min  Charges:  $Therapeutic Exercise: 8-22 mins                     Marisa Severin, PT, DPT Acute Rehabilitation Services Pager [phone] Office [phone]       Marguarite Arbour A Sabra Heck 05/12/2019, 3:36 PM

## 2019-05-12 NOTE — Progress Notes (Signed)
Pt refusing VS

## 2019-05-12 NOTE — Progress Notes (Signed)
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 Admitted for acute exacerbation of CHF.    OT comments  Pt progressing, but wanting to perform grooming in sitting instead of standing. Pt able to state 1 energy conservation technique, but requires cues and education for additional techniques. Pt modified independence for bed mobility and transfers to recliner.  Pt would greatly benefit from continued OT skilled services for ADL, mobility and energy conservation. OT following acutely possible d/c from OT soon.    Follow Up Recommendations  Home health OT;Supervision/Assistance - 24 hour    Equipment Recommendations  None recommended by OT    Recommendations for Other Services      Precautions / Restrictions Precautions Precautions: Fall Precaution Comments: watch O2, per RN SpO2 should be between 88-92% Restrictions Weight Bearing Restrictions: No       Mobility Bed Mobility Overal bed mobility: Modified Independent             General bed mobility comments: pt moving from sitting up in bed to EOB with no physical assist  Transfers Overall transfer level: Modified independent Equipment used: None Transfers: Sit to/from Stand Sit to Stand: Modified independent (Device/Increase time)         General transfer comment: stood from bed and transferred to recliner    Balance Overall balance assessment: Needs assistance Sitting-balance support: Feet supported;No upper extremity supported Sitting balance-Leahy Scale: Good Sitting balance - Comments: mod I   Standing balance support: During functional  activity Standing balance-Leahy Scale: Fair Standing balance comment: able to ambulate without AD, however reaching for commode/recliner arms during transfers, use of RW for in room ambulation                           ADL either performed or assessed with clinical judgement   ADL Overall ADL's : Needs assistance/impaired     Grooming: Set up;Sitting                   Toilet Transfer: Supervision/safety;Cueing for safety;BSC Toilet Transfer Details (indicate cue type and reason): cues for safety managing O2 lines with transfer to/from bedside commode         Functional mobility during ADLs: Supervision/safety;Cueing for safety General ADL Comments: cues for pursed lip breathing and education for energy conservation     Vision       Perception     Praxis      Cognition Arousal/Alertness: Awake/alert Behavior During Therapy: WFL for tasks assessed/performed Overall Cognitive Status: Within Functional Limits for tasks assessed                                          Exercises     Shoulder Instructions       General Comments SpO2 >93% on 3L with exertion    Pertinent Vitals/ Pain       Pain Assessment: No/denies pain  Home Living  Prior Functioning/Environment              Frequency  Min 2X/week        Progress Toward Goals  OT Goals(current goals can now be found in the care plan section)  Progress towards OT goals: Progressing toward goals  Acute Rehab OT Goals Patient Stated Goal: " to get rid of this belly." OT Goal Formulation: With patient Time For Goal Achievement: 05/20/19 Potential to Achieve Goals: Good ADL Goals Pt Will Perform Lower Body Dressing: with supervision;sit to/from stand;with adaptive equipment Pt Will Transfer to Toilet: with modified independence;ambulating Pt Will Perform Toileting - Clothing Manipulation and hygiene: with  modified independence;sitting/lateral leans;sit to/from stand Additional ADL Goal #1: Pt will demonstrate increased activity tolerance by completing 3 consecutive grooming tasks at sink without rest breaks. Additional ADL Goal #2: Pt will demonstrate use of 2 energy conservation techniques during ADL routine with independence.  Plan Discharge plan remains appropriate    Co-evaluation                 AM-PAC OT "6 Clicks" Daily Activity     Outcome Measure   Help from another person eating meals?: None Help from another person taking care of personal grooming?: A Little Help from another person toileting, which includes using toliet, bedpan, or urinal?: None Help from another person bathing (including washing, rinsing, drying)?: A Little Help from another person to put on and taking off regular upper body clothing?: None Help from another person to put on and taking off regular lower body clothing?: A Little 6 Click Score: 21    End of Session Equipment Utilized During Treatment: Oxygen  OT Visit Diagnosis: Other abnormalities of gait and mobility (R26.89);Muscle weakness (generalized) (M62.81)   Activity Tolerance Patient tolerated treatment well   Patient Left in chair;with call bell/phone within reach   Nurse Communication Mobility status        Time: 1205-1217 OT Time Calculation (min): 12 min  Charges: OT General Charges $OT Visit: 1 Visit OT Treatments $Self Care/Home Management : 8-22 mins  Jefferey Pica, OTR/L Acute Rehabilitation Services Pager: [phone] Office: [phone]    Dalal Livengood C 05/12/2019, 4:41 PM

## 2019-05-12 NOTE — Progress Notes (Addendum)
Nose bleeding on and off all morning into this afternoon. Paged md per pt request. Educated pt on leaving tissue in her nose and to stop removing it. Pt seems to understand.

## 2019-05-12 NOTE — Progress Notes (Signed)
Inpatient Diabetes Program Recommendations  AACE/ADA: New Consensus Statement on Inpatient Glycemic Control   Target Ranges:  Prepandial:   less than 140 mg/dL      Peak postprandial:   less than 180 mg/dL (1-2 hours)      Critically ill patients:  140 - 180 mg/dL   Results for Christine Sims, Christine Sims (MRN [id number]) as of 05/12/2019 10:01  Ref. Range 05/11/2019 06:22 05/11/2019 12:03 05/11/2019 17:00 05/11/2019 21:09 05/12/2019 06:10  Glucose-Capillary Latest Ref Range: 70 - 99 mg/dL 203 (H) 233 (H) 107 (H) 180 (H) 215 (H)   Review of Glycemic Control  Diabetes history: DM2 Outpatient Diabetes medications: Lantus 60 units BID, Novolog 30 units TID with meals Current orders for Inpatient glycemic control: Lantus 50 units BID, Novolog 0-20 units TID with meals, Novolog 0-5 units QHS, Novolog 10 units TID with meals  Inpatient Diabetes Program Recommendations:    Insulin-Basal: Please consider increasing Lantus to 53 units BID.  Thanks, Barnie Alderman, RN, MSN, CDE Diabetes Coordinator Inpatient Diabetes Program [phone] (Team Pager from 8am to 5pm)

## 2019-05-12 NOTE — Progress Notes (Signed)
NAME:  Christine Sims, MRN:  [id number], DOB:  1955-09-11, LOS: 7 ADMISSION DATE:  05/05/2019   Subjective/Interm history  No overnight events VSS Discussed plans to continue diuresis  Objective   Blood pressure (!) 147/77, pulse 75, temperature 97.8 F (36.6 C), temperature source Oral, resp. rate 17, height 5\' 4"  (1.626 m), weight 116.6 kg, SpO2 100 %.     Intake/Output Summary (Last 24 hours) at 05/12/2019 0542 Last data filed at 05/12/2019 0342 Gross per 24 hour  Intake 989.26 ml  Output 1800 ml  Net -810.74 ml   Filed Weights   05/10/19 0323 05/11/19 0425 05/12/19 0200  Weight: 117.1 kg 116.8 kg 116.6 kg    Examination: GENERAL: in no acute distress CARDIAC: heart RRR. LE edema still significant but slowly improving PULMONARY: 3L supplemental O2 via [place]. Lung sounds distant but clear. ABDOMEN: soft.  Nondistended.  SKIN: intact dressings to LE wounds  Labs    CBC Latest Ref Rng & Units 05/11/2019 05/09/2019 05/07/2019  WBC 4.0 - 10.5 K/uL 6.9 6.0 4.5  Hemoglobin 12.0 - 15.0 g/dL 8.9(L) 8.2(L) 8.4(L)  Hematocrit 36.0 - 46.0 % 30.0(L) 27.6(L) 28.5(L)  Platelets 150 - 400 K/uL 348 298 236   BMP Latest Ref Rng & Units 05/11/2019 05/10/2019 05/09/2019  Glucose 70 - 99 mg/dL 280(H) 112(H) 186(H)  BUN 8 - 23 mg/dL 38(H) 38(H) 37(H)  Creatinine 0.44 - 1.00 mg/dL 2.11(H) 2.03(H) 2.14(H)  Sodium 135 - 145 mmol/L 138 141 139  Potassium 3.5 - 5.1 mmol/L 4.3 3.8 4.0  Chloride 98 - 111 mmol/L 94(L) 97(L) 96(L)  CO2 22 - 32 mmol/L 33(H) 32 31  Calcium 8.9 - 10.3 mg/dL 9.0 9.4 9.0   Summary  64 yo chronically ill female with combined heart failure (EF 30-35%) and COPD who presented to [facility] with 1w history of progressive edema and shortness of breath and found to be in a heart failure exacerbation.  Assessment & Plan:  Principal Problem:   Acute exacerbation of CHF (congestive heart failure) (HCC) Active Problems:   COPD with acute exacerbation (HCC)   HTN (hypertension)   CKD  (chronic kidney disease) stage 3, GFR 30-59 ml/min   Uncontrolled type 2 diabetes mellitus with hyperglycemia (HCC)   Morbid obesity (HCC)   Acute on chronic respiratory failure (HCC)  NYHA Class III Combined Heart Failure Exacerbation Presenting weight 122 kg. Reported baseline about 105-110kg Down 5kg from admission  Daily Net output -1.3L. Total net: -9L Overall, still appearing fairly hypervolemic and above dry weight suggesting further diuresis will be needed. Plan Continue 120mg  Lasix BID IV Will decrease spironolactone to 12.5 given her bump in serum K Continue imdur. 25mg  hydralazine tid restarted 3/6 Daily weights, strict I/Os, fluid restriction 1585mL, Tele monitoring  Chronic COPD with chronic respiratory failure. Suspected exacerbation.  [place] 2L supplemental O2 requirement Remains on 3L [place].  Completed 5d course of prednisone -Likely a component of OHS as well and patient would benefit from outpatient sleep study. Plan: duonebs qid, incruse, dulera.   Maintain O2 sats 88-92%. If sats are >92%, turn down O2. Call primary team if there are any questions or concerns regarding this.  Chronic Hypertension with history of CVA Blood pressures stable Plan: Continue home dose of metoprolol 100 mg. Spironolactone started 05/06/19. Hydral restarted 3/6. Continue Plavix and statin.  T2DM.  Glucose levels improved from yesterday. SSI +meal +HS coverage + 50U Lantus BID. Will continue to monitor  CKD III: renal function remains elevated from baseline  however I suspect this will improve after completing high intensity diuresis. Stable from yesterday.  Borderline Iron Deficency Anemia. Hgb stable and at baseline. Received ferriheme infusion on 3/3  Best practice:  CODE STATUS: DNR Diet: cardiac, diabetic, 1593mL fluid restriction DVT for prophylaxis: lovenox Dispo: pending diuresis and clinical improvement   Mitzi Hansen, MD INTERNAL MEDICINE RESIDENT PGY-1 PAGER #:  [phone] 05/12/19  5:42 AM

## 2019-05-12 NOTE — Progress Notes (Signed)
Pt. C/o nose bleed to rt nare. Pt states this happens at home. Packed and educated pt on leaving nose alone with packing.

## 2019-05-13 DIAGNOSIS — R040 Epistaxis: Secondary | ICD-10-CM

## 2019-05-13 LAB — BASIC METABOLIC PANEL
Anion gap: 13 (ref 5–15)
BUN: 43 mg/dL — ABNORMAL HIGH (ref 8–23)
CO2: 32 mmol/L (ref 22–32)
Calcium: 8.8 mg/dL — ABNORMAL LOW (ref 8.9–10.3)
Chloride: 93 mmol/L — ABNORMAL LOW (ref 98–111)
Creatinine, Ser: 2.06 mg/dL — ABNORMAL HIGH (ref 0.44–1.00)
GFR calc Af Amer: 29 mL/min — ABNORMAL LOW (ref >60)
GFR calc non Af Amer: 25 mL/min — ABNORMAL LOW (ref >60)
Glucose, Bld: 298 mg/dL — ABNORMAL HIGH (ref 70–99)
Potassium: 3.8 mmol/L (ref 3.5–5.1)
Sodium: 138 mmol/L (ref 135–145)

## 2019-05-13 LAB — GLUCOSE, CAPILLARY
Glucose-Capillary: 335 mg/dL — ABNORMAL HIGH (ref 70–99)
Glucose-Capillary: 282 mg/dL — ABNORMAL HIGH (ref 70–99)
Glucose-Capillary: 295 mg/dL — ABNORMAL HIGH (ref 70–99)
Glucose-Capillary: 134 mg/dL — ABNORMAL HIGH (ref 70–99)

## 2019-05-13 MED ORDER — INSULIN GLARGINE 100 UNIT/ML ~~LOC~~ SOLN
55.0000 [IU] | Freq: Two times a day (BID) | SUBCUTANEOUS | Status: DC
  Administered 2019-05-13 – 2019-05-14 (×2): 55 [IU] via SUBCUTANEOUS
  Filled 2019-05-13 (×3): qty 0.55

## 2019-05-13 MED ORDER — METOLAZONE 2.5 MG PO TABS
2.5000 mg | ORAL_TABLET | Freq: Once | ORAL | Status: AC
  Administered 2019-05-13: 17:00:00 2.5 mg via ORAL
  Filled 2019-05-13: qty 1

## 2019-05-13 MED ORDER — FUROSEMIDE 10 MG/ML IJ SOLN
120.0000 mg | Freq: Two times a day (BID) | INTRAVENOUS | Status: DC
  Administered 2019-05-13: 16:00:00 120 mg via INTRAVENOUS
  Filled 2019-05-13 (×2): qty 12

## 2019-05-13 MED ORDER — FUROSEMIDE 80 MG PO TABS
80.0000 mg | ORAL_TABLET | Freq: Three times a day (TID) | ORAL | Status: DC
  Administered 2019-05-13 – 2019-05-14 (×2): 80 mg via ORAL
  Filled 2019-05-13 (×2): qty 1

## 2019-05-13 MED ORDER — SODIUM CHLORIDE 0.9 % IV SOLN
510.0000 mg | Freq: Once | INTRAVENOUS | Status: AC
  Administered 2019-05-13: 510 mg via INTRAVENOUS
  Filled 2019-05-13: qty 17

## 2019-05-13 NOTE — Progress Notes (Signed)
Inpatient Diabetes Program Recommendations  AACE/ADA: New Consensus Statement on Inpatient Glycemic Control   Target Ranges:  Prepandial:   less than 140 mg/dL      Peak postprandial:   less than 180 mg/dL (1-2 hours)      Critically ill patients:  140 - 180 mg/dL   Results for LYNISE, PORR (MRN [id number]) as of 05/13/2019 06:02  Ref. Range 05/12/2019 06:10 05/12/2019 11:08 05/12/2019 16:13 05/12/2019 21:13  Glucose-Capillary Latest Ref Range: 70 - 99 mg/dL 215 (H)  Novolog 17 units 253 (H)  Novolog 21 units  Lantus 50 units 177 (H)  Novolog 14 units 174 (H)     Lantus 50 units   Review of Glycemic Control  Diabetes history: DM2 Outpatient Diabetes medications: Lantus 60 units BID, Novolog 30 units TID with meals Current orders for Inpatient glycemic control: Lantus 50 units BID, Novolog 0-20 units TID with meals, Novolog 0-5 units QHS, Novolog 10 units TID with meals  Inpatient Diabetes Program Recommendations:    Insulin-Basal: Noted no longer ordered steroids.  If glucose is consistently greater than 180 mg/dl, please consider increasing Lantus to 53 units BID.  Thanks, Barnie Alderman, RN, MSN, CDE Diabetes Coordinator Inpatient Diabetes Program [phone] (Team Pager from 8am to 5pm)

## 2019-05-13 NOTE — Progress Notes (Signed)
Patient has asked tech to return in the morning for vitals and weight.  Patient asked lab to return after breakfast because she was not ready to get up yet.

## 2019-05-13 NOTE — Progress Notes (Signed)
NAME:  MIKALIA FESSEL, MRN:  [id number], DOB:  26-Feb-1956, LOS: 8 ADMISSION DATE:  05/05/2019   Subjective/Interm history  No overnight events. Discussed that we will use IV diuresis one more day and then likely be able to transition to po  Objective   Blood pressure (!) 130/57, pulse 79, temperature 98.8 F (37.1 C), temperature source Oral, resp. rate 18, height 5\' 4"  (1.626 m), weight 116.6 kg, SpO2 97 %.     Intake/Output Summary (Last 24 hours) at 05/13/2019 0537 Last data filed at 05/13/2019 0101 Gross per 24 hour  Intake 770 ml  Output 3600 ml  Net -2830 ml   Filed Weights   05/10/19 0323 05/11/19 0425 05/12/19 0200  Weight: 117.1 kg 116.8 kg 116.6 kg    Examination: GENERAL: in no acute distress CARDIAC: heart RRR. LE edema still significant but slowly improving PULMONARY: 3L supplemental O2 via [place]. Lung sounds distant but clear. ABDOMEN: soft.  Nondistended.  SKIN: intact dressings to LE wounds  Labs    CBC Latest Ref Rng & Units 05/11/2019 05/09/2019 05/07/2019  WBC 4.0 - 10.5 K/uL 6.9 6.0 4.5  Hemoglobin 12.0 - 15.0 g/dL 8.9(L) 8.2(L) 8.4(L)  Hematocrit 36.0 - 46.0 % 30.0(L) 27.6(L) 28.5(L)  Platelets 150 - 400 K/uL 348 298 236   BMP Latest Ref Rng & Units 05/12/2019 05/11/2019 05/10/2019  Glucose 70 - 99 mg/dL 232(H) 280(H) 112(H)  BUN 8 - 23 mg/dL 47(H) 38(H) 38(H)  Creatinine 0.44 - 1.00 mg/dL 2.19(H) 2.11(H) 2.03(H)  Sodium 135 - 145 mmol/L 138 138 141  Potassium 3.5 - 5.1 mmol/L 5.2(H) 4.3 3.8  Chloride 98 - 111 mmol/L 96(L) 94(L) 97(L)  CO2 22 - 32 mmol/L 29 33(H) 32  Calcium 8.9 - 10.3 mg/dL 8.7(L) 9.0 9.4   Summary  64 yo chronically ill female with combined heart failure (EF 30-35%) and COPD who presented to [facility] with 1w history of progressive edema and shortness of breath and found to be in a heart failure exacerbation.  Assessment & Plan:  Principal Problem:   Acute exacerbation of CHF (congestive heart failure) (HCC) Active Problems:   COPD with  acute exacerbation (HCC)   HTN (hypertension)   CKD (chronic kidney disease) stage 3, GFR 30-59 ml/min   Uncontrolled type 2 diabetes mellitus with hyperglycemia (HCC)   Morbid obesity (HCC)   Acute on chronic respiratory failure (HCC)  NYHA Class III Combined Heart Failure Exacerbation Presenting weight 122 kg. Reported baseline about 105-110kg Down 5kg from admission. Weight unchanged essentially from 3/7  Daily Net output  -2.5L. Total net: -9L Still hypervolemic but slowly improving. Plan Continue 120mg  Lasix BID IV Continue imdur. 25mg  hydralazine tid restarted 3/6, spironolactone 12.5mg  Daily weights, strict I/Os, fluid restriction 1571mL, Tele monitoring  Epistaxis. Reported 3/8. Patient notes intermittent nose bleeds at home as well. Suspect this to be from O2--currently using humidifier. Plan: ocean spray to bilateral nostrils.  Chronic COPD with chronic respiratory failure. Suspected exacerbation. RESOLVED.  [place] 2L supplemental O2 requirement. Remains on 3L [place].  Completed 5d course of prednisone Overall does sound better, both grossly and on auscultation, from admission -Likely a component of OHS as well and patient would benefit from outpatient sleep study. Plan: duonebs qid, incruse, dulera.   Maintain O2 sats 88-92%. If sats are >92%, turn down O2. Call primary team if there are any questions or concerns regarding this.  Chronic Hypertension with history of CVA Blood pressures stable Plan: Continue home dose of metoprolol 100 mg.  Spironolactone started 05/06/19. Hydral restarted 3/6. Continue Plavix and statin.  T2DM.  Glucose stable. SSI +meal +HS coverage + 55U Lantus BID. Will continue to monitor  CKD III: renal function remains elevated from baseline however I suspect this will improve after completing high intensity diuresis. Stable from yesterday.  Borderline Iron Deficency Anemia. Hgb stable and at baseline. Received ferriheme infusion on 3/3. Repeat feraheme  infusion today.  Best practice:  CODE STATUS: DNR Diet: cardiac, diabetic, 1544mL fluid restriction DVT for prophylaxis: lovenox Dispo: pending diuresis and clinical improvement. HH PT/OT    Mitzi Hansen, MD INTERNAL MEDICINE RESIDENT PGY-1 PAGER #: [phone] 05/13/19  5:37 AM

## 2019-05-13 NOTE — TOC Initial Note (Addendum)
Transition of Care [facility]) - Initial/Assessment Note    Patient Details  Name: Christine Sims MRN: [id number] Date of Birth: 1955-11-30  [facility] CM/SW Contact:    Zenon Mayo, RN Phone Number: 05/13/2019, 2:59 PM  Clinical Narrative:                 NCM spoke with patient, she states daughter lives with her and will be there 24 hrs, NCM offeered choice for HHPT/HHOT, she states she does not have a preference.  NCM made referral with Tiffany with [facility].  She will check and call this NCM back. NCM also made referral to [facility] with Alvis Lemmings to see if he can take , he will call this NCM back. Alvis Lemmings could not take referral.  Awaiting call back from [facility]. Per Tiffany with [facility], states they can not take referral.  NCM was informed patient should be active with [facility], NCM contacted Cassie with [facility] , she will check and call this NCM back.  NCM maed referral to Dian Situ with [facility] he states he should be able to take referral for HHPT/HHOT.  Soc will begin 24 to 48 hrs post dc.   Expected Discharge Plan: [place] Barriers to Discharge: Continued Medical Work up   Patient Goals and CMS Choice Patient states their goals for this hospitalization and ongoing recovery are:: get better CMS Medicare.gov Compare Post Acute Care list provided to:: Patient Choice offered to / list presented to : Patient  Expected Discharge Plan and Services Expected Discharge Plan: [place] In-house Referral: NA Discharge Planning Services: CM Consult Post Acute Care Choice: [place] arrangements for the past 2 months: Single Family Home                 DME Arranged: (NA)         HH Arranged: PT, OT          Prior Living Arrangements/Services Living arrangements for the past 2 months: Single Family Home Lives with:: Adult Children Patient language and need for interpreter reviewed:: Yes Do you feel safe going back to the place  where you live?: Yes      Need for Family Participation in Patient Care: Yes (Comment) Care giver support system in place?: Yes (comment)   Criminal Activity/Legal Involvement Pertinent to Current Situation/Hospitalization: No - Comment as needed  Activities of Daily Living Home Assistive Devices/Equipment: Walker (specify type) ADL Screening (condition at time of admission) Patient's cognitive ability adequate to safely complete daily activities?: No Is the patient deaf or have difficulty hearing?: Yes Does the patient have difficulty seeing, even when wearing glasses/contacts?: No Does the patient have difficulty concentrating, remembering, or making decisions?: Yes Patient able to express need for assistance with ADLs?: Yes Does the patient have difficulty dressing or bathing?: No Independently performs ADLs?: Yes (appropriate for developmental age) Does the patient have difficulty walking or climbing stairs?: Yes Weakness of Legs: Both Weakness of Arms/Hands: None  Permission Sought/Granted                  Emotional Assessment   Attitude/Demeanor/Rapport: Engaged Affect (typically observed): Appropriate Orientation: : Oriented to Situation, Oriented to  Time, Oriented to Place, Oriented to Self   Psych Involvement: No (comment)  Admission diagnosis:  Acute exacerbation of CHF (congestive heart failure) ([place]) [I50.9] Acute on chronic respiratory failure, unspecified whether with hypoxia or hypercapnia (HCC) [J96.20] Acute on chronic congestive heart failure, unspecified heart  failure type [facility]) [I50.9] Patient Active Problem List   Diagnosis Date Noted  . Acute on chronic respiratory failure ([place])   . Uncontrolled type 2 diabetes mellitus with hyperglycemia ([place]) 05/09/2019  . Morbid obesity ([place]) 05/09/2019  . Acute exacerbation of CHF (congestive heart failure) ([place]) 05/05/2019  . COPD with acute exacerbation ([place]) 02/25/2017  . Rhinovirus infection 02/25/2017  .  HTN (hypertension) 02/25/2017  . Chronic combined systolic and diastolic heart failure ([place]) 02/25/2017  . CKD (chronic kidney disease) stage 3, GFR 30-59 ml/min 02/25/2017  . Diabetes mellitus, insulin dependent (IDDM), controlled 02/25/2017  . Depression 02/25/2017  . COPD (chronic obstructive pulmonary disease) ([place]) 02/18/2017  . Community acquired pneumonia 04/04/2015   PCP:  Kristopher Glee., MD Pharmacy:   [facility] [place] [address] [place] [address] [place] [address] Phone: [phone] Fax: [phone]     Social Determinants of Health (SDOH) Interventions    Readmission Risk Interventions Readmission Risk Prevention Plan 05/13/2019  Transportation Screening Complete  PCP or Specialist Appt within 3-5 Days Complete  HRI or [place] Complete  Social Work Consult for [place] Planning/Counseling Complete  Palliative Care Screening Not Applicable  Medication Review Press photographer) Complete  Some recent data might be hidden

## 2019-05-13 NOTE — Progress Notes (Signed)
Pt IV infiltrated and is refusing another IV. MD made aware.

## 2019-05-13 NOTE — Progress Notes (Signed)
3:48 PM  Received page regarding IV infiltration. Pt refusing another IV at this time despite being very excited about receiving more IV lasix this morning. Was planning on transitioning to po lasix tomorrow anyway so will just transition today instead and observe tomorrow for urine output. If appropriate, may be able to discharge home later tomorrow afternoon. Lasix 80mg  tid metalazone 2.5mg   Mitzi Hansen, MD 05/13/19 3:51 PM

## 2019-05-13 NOTE — Progress Notes (Signed)
Orthopedic Tech Progress Note Patient Details:  MANILLA STRIETER [birth year] [id number]  Ortho Devices Type of Ortho Device: Ace wrap, Unna boot Ortho Device/Splint Location: B LE Ortho Device/Splint Interventions: Application   Post Interventions Patient Tolerated: Well Instructions Provided: Care of device   Braulio Bosch 05/13/2019, 12:51 PM

## 2019-05-14 DIAGNOSIS — J9622 Acute and chronic respiratory failure with hypercapnia: Secondary | ICD-10-CM

## 2019-05-14 DIAGNOSIS — N1832 Chronic kidney disease, stage 3b: Secondary | ICD-10-CM

## 2019-05-14 DIAGNOSIS — J441 Chronic obstructive pulmonary disease with (acute) exacerbation: Secondary | ICD-10-CM

## 2019-05-14 LAB — GLUCOSE, CAPILLARY: Glucose-Capillary: 132 mg/dL — ABNORMAL HIGH (ref 70–99)

## 2019-05-14 LAB — BASIC METABOLIC PANEL
Anion gap: 12 (ref 5–15)
BUN: 42 mg/dL — ABNORMAL HIGH (ref 8–23)
CO2: 33 mmol/L — ABNORMAL HIGH (ref 22–32)
Calcium: 9.2 mg/dL (ref 8.9–10.3)
Chloride: 95 mmol/L — ABNORMAL LOW (ref 98–111)
Creatinine, Ser: 2.07 mg/dL — ABNORMAL HIGH (ref 0.44–1.00)
GFR calc Af Amer: 29 mL/min — ABNORMAL LOW (ref >60)
GFR calc non Af Amer: 25 mL/min — ABNORMAL LOW (ref >60)
Glucose, Bld: 122 mg/dL — ABNORMAL HIGH (ref 70–99)
Potassium: 4 mmol/L (ref 3.5–5.1)
Sodium: 140 mmol/L (ref 135–145)

## 2019-05-14 MED ORDER — HYDRALAZINE HCL 25 MG PO TABS: 25.0000 mg | ORAL_TABLET | Freq: Three times a day (TID) | ORAL | 0 refills | Status: DC

## 2019-05-14 MED ORDER — SPIRONOLACTONE 25 MG PO TABS: 12.5000 mg | ORAL_TABLET | Freq: Every day | ORAL | 0 refills | Status: AC

## 2019-05-14 MED ORDER — FUROSEMIDE 80 MG PO TABS: 80.0000 mg | ORAL_TABLET | Freq: Two times a day (BID) | ORAL | 11 refills | Status: DC

## 2019-05-14 MED ORDER — FUROSEMIDE 80 MG PO TABS: 80.0000 mg | ORAL_TABLET | Freq: Two times a day (BID) | ORAL | 0 refills | Status: DC

## 2019-05-14 NOTE — Progress Notes (Signed)
SATURATION QUALIFICATIONS: (This note is used to comply with regulatory documentation for home oxygen)  Patient Saturations on Room Air at Rest = 87%  Patient Saturations on Room Air while Ambulating = 85%  Patient Saturations on 3 Liters of oxygen while Ambulating = 94%  Please briefly explain why patient needs home oxygen: patient's oxygen saturation drops instantly when removing oxygen. Takes about a minute to recover when oxygen [place] is place back on 3L  Oxygen saturation goes back up.

## 2019-05-14 NOTE — Progress Notes (Signed)
To the best of my knowledge, the student's charting is accurate.  

## 2019-05-14 NOTE — Progress Notes (Signed)
Patient called daughter to pick her up and to bring her oxygen.

## 2019-05-14 NOTE — Discharge Summary (Addendum)
Name: Christine Sims MRN: [id number] DOB: [birth year] 64 y.o. PCP: Kristopher Glee., MD  Date of Admission: 05/05/2019 10:30 AM Date of Discharge: 05/14/19 Attending Physician: Dr. Heber Logan  Discharge Diagnosis: 1. Acute on chronic HFrEF 2. Acute on chronic respiratory failure with hypoxia and hypercapneia 3. CKD 3b 4. General physical deconditioning. 5. COPD with exacerbation  Discharge Medications: Allergies as of 05/14/2019       Reactions   Keppra [levetiracetam] Anaphylaxis      Lisinopril Anaphylaxis        Medication List     TAKE these medications    acetaminophen 500 MG tablet Commonly known as: TYLENOL Take 1,000 mg by mouth every 6 (six) hours as needed for headache (pain).   albuterol 108 (90 Base) MCG/ACT inhaler Commonly known as: VENTOLIN HFA Inhale 2 puffs into the lungs every 4 (four) hours as needed for wheezing or shortness of breath.   albuterol (2.5 MG/3ML) 0.083% nebulizer solution Commonly known as: PROVENTIL Take 3 mLs (2.5 mg total) by nebulization every 4 (four) hours as needed for wheezing or shortness of breath. For wheezing. Use 3 times daily x 3 days then ecery 4 hours as needed.   amitriptyline 25 MG tablet Commonly known as: ELAVIL Take 25 mg by mouth at bedtime.   amLODipine 10 MG tablet Commonly known as: NORVASC Take 10 mg by mouth daily.   aspirin 81 MG EC tablet Take 81 mg by mouth daily.   atorvastatin 80 MG tablet Commonly known as: LIPITOR Take 80 mg by mouth daily.   blood glucose meter kit and supplies Dispense based on patient and insurance preference. Use up to four times daily as directed. (FOR ICD-10 E10.9, E11.9).   budesonide 0.5 MG/2ML nebulizer solution Commonly known as: PULMICORT Inhale 2 mLs into the lungs every 12 (twelve) hours.   budesonide-formoterol 160-4.5 MCG/ACT inhaler Commonly known as: Symbicort Inhale 2 puffs into the lungs 2 (two) times daily.   busPIRone 15 MG tablet Commonly known  as: BUSPAR Take 15 mg by mouth 2 (two) times daily.   clopidogrel 75 MG tablet Commonly known as: PLAVIX Take 75 mg by mouth daily.   diclofenac sodium 1 % Gel Commonly known as: VOLTAREN Apply 2 g topically 2 (two) times daily as needed (pain).   doxazosin 2 MG tablet Commonly known as: CARDURA Take 2 mg by mouth daily.   famotidine 20 MG tablet Commonly known as: PEPCID Take 20 mg by mouth 2 (two) times daily.   fluticasone 50 MCG/ACT nasal spray Commonly known as: FLONASE Place 2 sprays into both nostrils daily.   furosemide 80 MG tablet Commonly known as: Lasix Take 1 tablet (80 mg total) by mouth 2 (two) times daily. What changed:  medication strength how much to take   furosemide 80 MG tablet Commonly known as: LASIX Take 1 tablet (80 mg total) by mouth 2 (two) times daily. What changed: You were already taking a medication with the same name, and this prescription was added. Make sure you understand how and when to take each.   gabapentin 300 MG capsule Commonly known as: NEURONTIN Take 300 mg by mouth at bedtime.   hydrALAZINE 25 MG tablet Commonly known as: APRESOLINE Take 1 tablet (25 mg total) by mouth every 8 (eight) hours. What changed:  medication strength how much to take   insulin glargine 100 unit/mL Sopn Commonly known as: LANTUS Inject 0.7 mLs (70 Units total) into the skin 2 (two) times daily.  Lantus SoloStar 100 UNIT/ML Solostar Pen Generic drug: insulin glargine Inject 60 Units into the skin every 12 (twelve) hours.   isosorbide mononitrate 30 MG 24 hr tablet Commonly known as: IMDUR Take 30 mg by mouth daily.   loratadine 10 MG tablet Commonly known as: CLARITIN Take 1 tablet (10 mg total) by mouth daily.   metoprolol succinate 50 MG 24 hr tablet Commonly known as: TOPROL-XL Take 100 mg by mouth daily. Take with or immediately following a meal.   montelukast 4 MG chewable tablet Commonly known as: SINGULAIR Chew 4 mg by  mouth every evening.   NovoLOG FlexPen 100 UNIT/ML FlexPen Generic drug: insulin aspart Inject 30 Units into the skin 3 (three) times daily with meals.   nystatin powder Commonly known as: MYCOSTATIN/NYSTOP Apply 1 application topically in the morning and at bedtime.   OXYGEN Inhale 2 L into the lungs continuous.   Ozempic (0.25 or 0.5 MG/DOSE) 2 MG/1.5ML Sopn Generic drug: Semaglutide(0.25 or 0.5MG/DOS) Inject 0.25-0.5 mg into the skin See admin instructions. Inject 0.60m once weekly for two weeks, then increase to 0.524mweekly. Mondays   pantoprazole 20 MG tablet Commonly known as: PROTONIX Take 20 mg by mouth daily.   spironolactone 25 MG tablet Commonly known as: ALDACTONE Take 0.5 tablets (12.5 mg total) by mouth daily.   tiZANidine 2 MG tablet Commonly known as: ZANAFLEX Take 2 mg by mouth every evening.   traZODone 100 MG tablet Commonly known as: DESYREL Take 100 mg by mouth at bedtime as needed for sleep.        Disposition and follow-up:   Ms.Pasha O LAURIANNE [facility]  At the hospital follow up visit please address:  1.  Decompensated heart failure. Lasix increased to 8068mID. She was receiving 120m83m lasix up to day prior to discharge. She may need to be titrated up on lasix so please address at follow up. Spironolactone started during hospitalization as well so will need close K monitoring.  2. Acute on chronic respiratory failure. 2L supplemental oxygen requirement at home. Required 3L during her hospitalization. Please readdress continued need of increase oxygen at hospital follow up. I also suspect she may have a component of OHS and may benefit from a sleep study to evaluate.  3. General deconditioning. HH PT/OT ordered at discharge.  2.  Labs / imaging needed at time of follow-up: BMP, CBC   Follow-up Appointments: Follow-up Information     CabeKristopher GleeD Follow up in 1 day(s).     Specialty: Internal Medicine Why: Office will call the patient with appt. date. Contact information: [phone]         [place], [place] up.   Specialty: Home Health Services Why: HHPT,HHOT Contact information: [id number] [phone]            Hospital Course: VercJAZAE GANDOLFIa 63 y39female with PMH of chronic respiratory failure requiring 2L supplemental oxygen, COPD, combined heart failure, T2DM and CKD III. [facility] for progressive shortness of breath and lower extremity edema.  Prior to this hospitalization, [facility], COPD exacerbation and heart failure exacerbation. Appears she gained about 18# over that hospitalization and had progressive symptoms since then. On admission, weight was 122kg. Patient reports  dry weight to be around 105-110kg. Over the course of her hospitalization, she underwent intense diuresis with 167m IV lasix bid. She symptomatically improved with volume removal. On day prior to discharge, she was transitioned to po 865mlasix bid. At time of discharge, her weight was down 6kg (116kg) and net volume out was -9L.  Hospitalization was complicated with a decline in renal function which I suspect to be from the intensive diuresis she underwent and should improve in the following weeks. She was also found to have borderline iron deficiency anemia for which she received 2 feraheme transfusions in the setting of her heart failure.   On initial presentation, she also exhibited signficant gross and ausculatory wheezing. She is on 2L supplemental oxygen at home for chronic respiratory failure from her COPD. Given her acute decompensated heart failure and history of COPD, delineation between COPD exacerbation and heart failure was  challenging. She did receive a 5d course of prednisone and her wheezing did improve however it is difficult to say if that was from improvement in her heart failure vs COPD.  She was also evaluated by PT and OT for physical deconditioning and was recommended to have home health PT/OT . This was ordered on hospital discharge.  Discharge Vitals:   BP (!) 152/76 (BP Location: Right Arm)   Pulse 81   Temp 98.7 F (37.1 C) (Oral)   Resp 18   Ht 5' 4" (1.626 m)   Wt 115.8 kg   SpO2 100%   BMI 43.82 kg/m   Pertinent Labs, Studies, and Procedures:  CBC Latest Ref Rng & Units 05/11/2019 05/09/2019 05/07/2019  WBC 4.0 - 10.5 K/uL 6.9 6.0 4.5  Hemoglobin 12.0 - 15.0 g/dL 8.9(L) 8.2(L) 8.4(L)  Hematocrit 36.0 - 46.0 % 30.0(L) 27.6(L) 28.5(L)  Platelets 150 - 400 K/uL 348 298 236   BMP Latest Ref Rng & Units 05/14/2019 05/13/2019 05/12/2019  Glucose 70 - 99 mg/dL 122(H) 298(H) 232(H)  BUN 8 - 23 mg/dL 42(H) 43(H) 47(H)  Creatinine 0.44 - 1.00 mg/dL 2.07(H) 2.06(H) 2.19(H)  Sodium 135 - 145 mmol/L 140 138 138  Potassium 3.5 - 5.1 mmol/L 4.0 3.8 5.2(H)  Chloride 98 - 111 mmol/L 95(L) 93(L) 96(L)  CO2 22 - 32 mmol/L 33(H) 32 29  Calcium 8.9 - 10.3 mg/dL 9.2 8.8(L) 8.7(L)   Iron/TIBC/Ferritin/ %Sat    Component Value Date/Time   IRON 37 05/07/2019 0545   TIBC 244 (L) 05/07/2019 0545   FERRITIN 112 05/07/2019 0545   IRONPCTSAT 15 05/07/2019 0545     Discharge Instructions: Discharge Instructions     (HEART FAILURE PATIENTS) Call MD:  Anytime you have any of the following symptoms: 1) 3 pound weight gain in 24 hours or 5 pounds in 1 week 2) shortness of breath, with or without a dry hacking cough 3) swelling in the hands, feet or stomach 4) if you have to sleep on extra pillows at night in order to breathe.   Complete by: As directed    Diet - low sodium heart healthy   Complete by: As directed    Heart Failure patients record your daily weight using the same scale at the same time of day    Complete by: As directed    Increase activity slowly   Complete by: As directed        Signed: RyMitzi HansenMD 05/17/2019, 9:28 AM   Pager: [phone]

## 2019-05-14 NOTE — Progress Notes (Signed)
Physical Therapy Treatment Patient Details Name: Christine Sims MRN: [id number] DOB: [birth year] Today's Date: 05/14/2019    History of Present Illness Pt is a 64 y/o female with PMH of combined heart failure (EF 30 to 35%), COPD requiring 2 L supplemental oxygen at baseline, hypertension, type 2 diabetes who is presenting with 1 week history of progressive shortness of breath and lower extremity edema. Admitted for acute exacerbation of CHF.     PT Comments    Patient seen for mobility progression. Current plan remains appropriate.    Follow Up Recommendations  Home health PT;Supervision - Intermittent     Equipment Recommendations  None recommended by PT    Recommendations for Other Services       Precautions / Restrictions Precautions Precautions: Fall Restrictions Weight Bearing Restrictions: No    Mobility  Bed Mobility                  Transfers Overall transfer level: Modified independent   Transfers: Sit to/from Stand           General transfer comment: use of RW upon standing   Ambulation/Gait Ambulation/Gait assistance: Supervision Gait Distance (Feet): (50 ft X 2) Assistive device: Rolling walker (2 wheeled) Gait Pattern/deviations: Step-through pattern;Decreased stride length;Trunk flexed Gait velocity: decreased   General Gait Details: cues for upright posture; pt began sitting in rolling chair in hallway without warning; did not appear to be in distress but c/o SOB and needing seated rest break   Stairs             Wheelchair Mobility    Modified Rankin (Stroke Patients Only)       Balance Overall balance assessment: Needs assistance Sitting-balance support: Feet supported;No upper extremity supported Sitting balance-Leahy Scale: Good     Standing balance support: During functional activity Standing balance-Leahy Scale: Fair                              Cognition Arousal/Alertness: Awake/alert Behavior  During Therapy: WFL for tasks assessed/performed Overall Cognitive Status: Within Functional Limits for tasks assessed                                 General Comments: impulsive to sit when c/o SOB in hallway and going to sit in rolling chair before it was secured and then reports falling out of a rolling chair recently       Exercises      General Comments General comments (skin integrity, edema, etc.): SpO2 97% on 3L O2 via HFNC while ambulating       Pertinent Vitals/Pain Pain Assessment: No/denies pain    Home Living                      Prior Function            PT Goals (current goals can now be found in the care plan section) Progress towards PT goals: Progressing toward goals    Frequency    Min 3X/week      PT Plan Current plan remains appropriate    Co-evaluation              AM-PAC PT "6 Clicks" Mobility   Outcome Measure  Help needed turning from your back to your side while in a flat bed without using bedrails?: None Help needed moving from lying on  your back to sitting on the side of a flat bed without using bedrails?: None Help needed moving to and from a bed to a chair (including a wheelchair)?: None Help needed standing up from a chair using your arms (e.g., wheelchair or bedside chair)?: None Help needed to walk in hospital room?: None Help needed climbing 3-5 steps with a railing? : A Little 6 Click Score: 23    End of Session Equipment Utilized During Treatment: Oxygen Activity Tolerance: Patient tolerated treatment well Patient left: in chair;with call bell/phone within reach Nurse Communication: Mobility status PT Visit Diagnosis: Muscle weakness (generalized) (M62.81);Difficulty in walking, not elsewhere classified (R26.2)     Time: 5848-3507 PT Time Calculation (min) (ACUTE ONLY): 20 min  Charges:  $Gait Training: 8-22 mins                     Earney Navy, PTA Acute Rehabilitation Services Pager:  [phone] Office: [phone]     Darliss Cheney 05/14/2019, 10:11 AM

## 2019-05-14 NOTE — TOC Transition Note (Signed)
Transition of Care [facility]) - CM/SW Discharge Note   Patient Details  Name: SUZETTA TIMKO MRN: [id number] Date of Birth: December 07, 1955  Transition of [facility]) CM/SW Contact:  Zenon Mayo, RN Phone Number: 05/14/2019, 12:18 PM   Clinical Narrative:    Patient for dc today, NCM made referral to Dian Situ with [facility] for HHPT/HHOT, he states he should be able to take referral .  Soc will begin 24 to 48 hrs post dc.    Final next level of care: [place] Barriers to Discharge: No Barriers Identified   Patient Goals and CMS Choice Patient states their goals for this hospitalization and ongoing recovery are:: get better CMS Medicare.gov Compare Post Acute Care list provided to:: Patient Choice offered to / list presented to : Patient  Discharge Placement                       Discharge Plan and Services In-house Referral: NA Discharge Planning Services: CM Consult Post Acute Care Choice: Home Health          DME Arranged: (NA)         HH Arranged: PT, OT HH Agency: [place] Date [place]: 05/14/19 Time [place]: 1218 Representative spoke with at [place]: [place] (Garhett Bernhard) Interventions     Readmission Risk Interventions Readmission Risk Prevention Plan 05/13/2019  Transportation Screening Complete  PCP or Specialist Appt within 3-5 Days Complete  HRI or [place] Complete  Social Work Consult for [place] Planning/Counseling Complete  Palliative Care Screening Not Applicable  Medication Review Press photographer) Complete  Some recent data might be hidden

## 2019-05-14 NOTE — Progress Notes (Signed)
NAME:  Christine Sims, MRN:  [id number], DOB:  08-17-1955, LOS: 9 ADMISSION DATE:  05/05/2019   Subjective/Interm history  Received page yesterday afternoon that patient no longer wants an IV after her current one had infiltrated.  VSS Discharge instructions discussed  Objective   Blood pressure 136/78, pulse 74, temperature (!) 97.4 F (36.3 C), temperature source Oral, resp. rate 16, height 5\' 4"  (1.626 m), weight 115.8 kg, SpO2 100 %.     Intake/Output Summary (Last 24 hours) at 05/14/2019 0537 Last data filed at 05/14/2019 0455 Gross per 24 hour  Intake 713.4 ml  Output 2000 ml  Net -1286.6 ml   Filed Weights   05/11/19 0425 05/12/19 0200 05/14/19 0449  Weight: 116.8 kg 116.6 kg 115.8 kg    Examination: GENERAL: in no acute distress CARDIAC: heart RRR. LE edema still significant but slowly improving PULMONARY: 3L supplemental O2 via [place]. Lung sounds distant but clear. ABDOMEN: soft.  Nondistended.  SKIN: intact dressings to LE wounds  Labs    CBC Latest Ref Rng & Units 05/11/2019 05/09/2019 05/07/2019  WBC 4.0 - 10.5 K/uL 6.9 6.0 4.5  Hemoglobin 12.0 - 15.0 g/dL 8.9(L) 8.2(L) 8.4(L)  Hematocrit 36.0 - 46.0 % 30.0(L) 27.6(L) 28.5(L)  Platelets 150 - 400 K/uL 348 298 236   BMP Latest Ref Rng & Units 05/13/2019 05/12/2019 05/11/2019  Glucose 70 - 99 mg/dL 298(H) 232(H) 280(H)  BUN 8 - 23 mg/dL 43(H) 47(H) 38(H)  Creatinine 0.44 - 1.00 mg/dL 2.06(H) 2.19(H) 2.11(H)  Sodium 135 - 145 mmol/L 138 138 138  Potassium 3.5 - 5.1 mmol/L 3.8 5.2(H) 4.3  Chloride 98 - 111 mmol/L 93(L) 96(L) 94(L)  CO2 22 - 32 mmol/L 32 29 33(H)  Calcium 8.9 - 10.3 mg/dL 8.8(L) 8.7(L) 9.0   Summary  63 yo chronically ill female with 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  Assessment & Plan:  Principal Problem:   Acute exacerbation of CHF (congestive heart failure)  (HCC) Active Problems:   COPD with acute exacerbation (HCC)   HTN (hypertension)   CKD (chronic kidney disease) stage 3, GFR 30-59 ml/min   Uncontrolled type 2 diabetes mellitus with hyperglycemia (HCC)   Morbid obesity (HCC)   Acute on chronic respiratory failure (HCC)  NYHA Class III Combined Heart Failure Exacerbation Presenting weight 122 kg. Reported baseline about 105-110kg Down 6kg from admission.  Daily Net output  -2.5L. Total net: -9L Plan Transitioned to 80mg  lasix tid po 3/9 Continue imdur. 25mg  hydralazine, spironolactone 12.5mg   Epistaxis. Suspect this to be from O2--currently using humidifier. No further epistaxis since 3/8. Plan: ocean spray to bilateral nostrils.  Chronic COPD with chronic respiratory failure. Suspected exacerbation. RESOLVED.  Ba[place]line 2L supplemental O2 requirement. Remains on 3L [place].  Completed 5d course of prednisone Overall does sound better, both grossly and on auscultation, from admission -Likely a component of OHS as well and patient would benefit from outpatient sleep study. Plan: duonebs qid, incruse, dulera.    Chronic Hypertension with history of CVA Blood pressures stable Plan: Continue home dose of metoprolol 100 mg. Spironolactone started 05/06/19. Hydral restarted 3/6. Continue Plavix and statin.  T2DM.  SSI +meal +HS coverage + 55U Lantus BID. Will continue to monitor  CKD III: stable.  Borderline Iron Deficency Anemia. Likely related to CKD, chronic disease.  Completed 2/2 ferriheme infusions   Best practice:  CODE STATUS: DNR Diet: cardiac, diabetic, 1577mL fluid restriction DVT for prophylaxis: lovenox Dispo: discharge   Mitzi Hansen, MD [place] PGY-1 PAGER #: [phone] 05/14/19  5:37 AM

## 2019-06-26 ENCOUNTER — Inpatient Hospital Stay (HOSPITAL_COMMUNITY)
Admission: EM | Admit: 2019-06-26 | Discharge: 2019-07-04 | DRG: 291 | Disposition: A | Discharge status: Home Health | Payer: Medicaid Other | Attending: Internal Medicine | Admitting: Internal Medicine

## 2019-06-26 ENCOUNTER — Emergency Department (HOSPITAL_COMMUNITY): Payer: Medicaid Other

## 2019-06-26 DIAGNOSIS — L03115 Cellulitis of right lower limb: Secondary | ICD-10-CM | POA: Diagnosis present

## 2019-06-26 DIAGNOSIS — I255 Ischemic cardiomyopathy: Secondary | ICD-10-CM | POA: Diagnosis present

## 2019-06-26 DIAGNOSIS — I25119 Atherosclerotic heart disease of native coronary artery with unspecified angina pectoris: Secondary | ICD-10-CM | POA: Diagnosis present

## 2019-06-26 DIAGNOSIS — F419 Anxiety disorder, unspecified: Secondary | ICD-10-CM | POA: Diagnosis present

## 2019-06-26 DIAGNOSIS — I5023 Acute on chronic systolic (congestive) heart failure: Secondary | ICD-10-CM

## 2019-06-26 DIAGNOSIS — Z955 Presence of coronary angioplasty implant and graft: Secondary | ICD-10-CM | POA: Diagnosis not present

## 2019-06-26 DIAGNOSIS — Z9119 Patient's noncompliance with other medical treatment and regimen: Secondary | ICD-10-CM

## 2019-06-26 DIAGNOSIS — E7800 Pure hypercholesterolemia, unspecified: Secondary | ICD-10-CM | POA: Diagnosis present

## 2019-06-26 DIAGNOSIS — E1143 Type 2 diabetes mellitus with diabetic autonomic (poly)neuropathy: Secondary | ICD-10-CM | POA: Diagnosis not present

## 2019-06-26 DIAGNOSIS — Z833 Family history of diabetes mellitus: Secondary | ICD-10-CM

## 2019-06-26 DIAGNOSIS — E1122 Type 2 diabetes mellitus with diabetic chronic kidney disease: Secondary | ICD-10-CM | POA: Diagnosis present

## 2019-06-26 DIAGNOSIS — I5043 Acute on chronic combined systolic (congestive) and diastolic (congestive) heart failure: Secondary | ICD-10-CM | POA: Diagnosis present

## 2019-06-26 DIAGNOSIS — I252 Old myocardial infarction: Secondary | ICD-10-CM

## 2019-06-26 DIAGNOSIS — Z79899 Other long term (current) drug therapy: Secondary | ICD-10-CM | POA: Diagnosis not present

## 2019-06-26 DIAGNOSIS — F329 Major depressive disorder, single episode, unspecified: Secondary | ICD-10-CM | POA: Diagnosis present

## 2019-06-26 DIAGNOSIS — K219 Gastro-esophageal reflux disease without esophagitis: Secondary | ICD-10-CM | POA: Diagnosis present

## 2019-06-26 DIAGNOSIS — Z9114 Patient's other noncompliance with medication regimen: Secondary | ICD-10-CM | POA: Diagnosis not present

## 2019-06-26 DIAGNOSIS — M79609 Pain in unspecified limb: Secondary | ICD-10-CM | POA: Diagnosis not present

## 2019-06-26 DIAGNOSIS — Z91199 Patient's noncompliance with other medical treatment and regimen due to unspecified reason: Secondary | ICD-10-CM

## 2019-06-26 DIAGNOSIS — J441 Chronic obstructive pulmonary disease with (acute) exacerbation: Secondary | ICD-10-CM | POA: Diagnosis present

## 2019-06-26 DIAGNOSIS — Z7951 Long term (current) use of inhaled steroids: Secondary | ICD-10-CM | POA: Diagnosis not present

## 2019-06-26 DIAGNOSIS — Z7902 Long term (current) use of antithrombotics/antiplatelets: Secondary | ICD-10-CM | POA: Diagnosis not present

## 2019-06-26 DIAGNOSIS — Z888 Allergy status to other drugs, medicaments and biological substances status: Secondary | ICD-10-CM

## 2019-06-26 DIAGNOSIS — I10 Essential (primary) hypertension: Secondary | ICD-10-CM | POA: Diagnosis not present

## 2019-06-26 DIAGNOSIS — I130 Hypertensive heart and chronic kidney disease with heart failure and stage 1 through stage 4 chronic kidney disease, or unspecified chronic kidney disease: Secondary | ICD-10-CM | POA: Diagnosis present

## 2019-06-26 DIAGNOSIS — Z7982 Long term (current) use of aspirin: Secondary | ICD-10-CM | POA: Diagnosis not present

## 2019-06-26 DIAGNOSIS — E1165 Type 2 diabetes mellitus with hyperglycemia: Secondary | ICD-10-CM | POA: Diagnosis not present

## 2019-06-26 DIAGNOSIS — Z794 Long term (current) use of insulin: Secondary | ICD-10-CM | POA: Diagnosis not present

## 2019-06-26 DIAGNOSIS — E1129 Type 2 diabetes mellitus with other diabetic kidney complication: Secondary | ICD-10-CM | POA: Diagnosis not present

## 2019-06-26 DIAGNOSIS — N184 Chronic kidney disease, stage 4 (severe): Secondary | ICD-10-CM | POA: Diagnosis present

## 2019-06-26 DIAGNOSIS — J9621 Acute and chronic respiratory failure with hypoxia: Secondary | ICD-10-CM | POA: Diagnosis present

## 2019-06-26 DIAGNOSIS — N1831 Chronic kidney disease, stage 3a: Secondary | ICD-10-CM | POA: Diagnosis not present

## 2019-06-26 DIAGNOSIS — G629 Polyneuropathy, unspecified: Secondary | ICD-10-CM | POA: Diagnosis present

## 2019-06-26 DIAGNOSIS — Z8673 Personal history of transient ischemic attack (TIA), and cerebral infarction without residual deficits: Secondary | ICD-10-CM

## 2019-06-26 DIAGNOSIS — Z6841 Body Mass Index (BMI) 40.0 and over, adult: Secondary | ICD-10-CM | POA: Diagnosis not present

## 2019-06-26 DIAGNOSIS — Z87891 Personal history of nicotine dependence: Secondary | ICD-10-CM

## 2019-06-26 DIAGNOSIS — Z20822 Contact with and (suspected) exposure to covid-19: Secondary | ICD-10-CM | POA: Diagnosis present

## 2019-06-26 DIAGNOSIS — I2510 Atherosclerotic heart disease of native coronary artery without angina pectoris: Secondary | ICD-10-CM | POA: Diagnosis not present

## 2019-06-26 LAB — RETICULOCYTES
Immature Retic Fract: 15.9 % (ref 2.3–15.9)
RBC.: 3.31 MIL/uL — ABNORMAL LOW (ref 3.87–5.11)
Retic Count, Absolute: 63.2 10*3/uL (ref 19.0–186.0)
Retic Ct Pct: 1.9 % (ref 0.4–3.1)

## 2019-06-26 LAB — COMPREHENSIVE METABOLIC PANEL
ALT: 17 U/L (ref 0–44)
AST: 10 U/L — ABNORMAL LOW (ref 15–41)
Albumin: 2.8 g/dL — ABNORMAL LOW (ref 3.5–5.0)
Alkaline Phosphatase: 83 U/L (ref 38–126)
Anion gap: 8 (ref 5–15)
BUN: 24 mg/dL — ABNORMAL HIGH (ref 8–23)
CO2: 27 mmol/L (ref 22–32)
Calcium: 9.1 mg/dL (ref 8.9–10.3)
Chloride: 106 mmol/L (ref 98–111)
Creatinine, Ser: 1.75 mg/dL — ABNORMAL HIGH (ref 0.44–1.00)
GFR calc Af Amer: 36 mL/min — ABNORMAL LOW (ref >60)
GFR calc non Af Amer: 31 mL/min — ABNORMAL LOW (ref >60)
Glucose, Bld: 191 mg/dL — ABNORMAL HIGH (ref 70–99)
Potassium: 3.8 mmol/L (ref 3.5–5.1)
Sodium: 141 mmol/L (ref 135–145)
Total Bilirubin: 0.4 mg/dL (ref 0.3–1.2)
Total Protein: 6.6 g/dL (ref 6.5–8.1)

## 2019-06-26 LAB — POCT I-STAT 7, (LYTES, BLD GAS, ICA,H+H)
Acid-Base Excess: 2 mmol/L (ref 0.0–2.0)
Bicarbonate: 27.1 mmol/L (ref 20.0–28.0)
Calcium, Ion: 1.33 mmol/L (ref 1.15–1.40)
HCT: 27 % — ABNORMAL LOW (ref 36.0–46.0)
Hemoglobin: 9.2 g/dL — ABNORMAL LOW (ref 12.0–15.0)
O2 Saturation: 99 %
Potassium: 3.7 mmol/L (ref 3.5–5.1)
Sodium: 140 mmol/L (ref 135–145)
TCO2: 28 mmol/L (ref 22–32)
pCO2 arterial: 44.2 mmHg (ref 32.0–48.0)
pH, Arterial: 7.395 (ref 7.350–7.450)
pO2, Arterial: 129 mmHg — ABNORMAL HIGH (ref 83.0–108.0)

## 2019-06-26 LAB — CBC WITH DIFFERENTIAL/PLATELET
Abs Immature Granulocytes: 0.03 10*3/uL (ref 0.00–0.07)
Basophils Absolute: 0 10*3/uL (ref 0.0–0.1)
Basophils Relative: 0 %
Eosinophils Absolute: 0.2 10*3/uL (ref 0.0–0.5)
Eosinophils Relative: 2 %
HCT: 22.4 % — ABNORMAL LOW (ref 36.0–46.0)
Hemoglobin: 6.8 g/dL — CL (ref 12.0–15.0)
Immature Granulocytes: 0 %
Lymphocytes Relative: 22 %
Lymphs Abs: 1.7 10*3/uL (ref 0.7–4.0)
MCH: 26.3 pg (ref 26.0–34.0)
MCHC: 30.4 g/dL (ref 30.0–36.0)
MCV: 86.5 fL (ref 80.0–100.0)
Monocytes Absolute: 0.8 10*3/uL (ref 0.1–1.0)
Monocytes Relative: 10 %
Neutro Abs: 5.1 10*3/uL (ref 1.7–7.7)
Neutrophils Relative %: 66 %
Platelets: 251 10*3/uL (ref 150–400)
RBC: 2.59 MIL/uL — ABNORMAL LOW (ref 3.87–5.11)
RDW: 16.7 % — ABNORMAL HIGH (ref 11.5–15.5)
WBC: 7.9 10*3/uL (ref 4.0–10.5)
nRBC: 0 % (ref 0.0–0.2)

## 2019-06-26 LAB — LACTIC ACID, PLASMA
Lactic Acid, Venous: 1.1 mmol/L (ref 0.5–1.9)
Lactic Acid, Venous: 0.8 mmol/L (ref 0.5–1.9)

## 2019-06-26 LAB — TYPE AND SCREEN
ABO/RH(D): A POS
Antibody Screen: NEGATIVE

## 2019-06-26 LAB — RESPIRATORY PANEL BY RT PCR (FLU A&B, COVID)
Influenza A by PCR: NEGATIVE
Influenza B by PCR: NEGATIVE
SARS Coronavirus 2 by RT PCR: NEGATIVE

## 2019-06-26 LAB — VITAMIN B12: Vitamin B-12: 528 pg/mL (ref 180–914)

## 2019-06-26 LAB — TROPONIN I (HIGH SENSITIVITY)
Troponin I (High Sensitivity): 19 ng/L — ABNORMAL HIGH (ref <18)
Troponin I (High Sensitivity): 20 ng/L — ABNORMAL HIGH (ref <18)

## 2019-06-26 LAB — IRON AND TIBC
Iron: 28 ug/dL (ref 28–170)
Saturation Ratios: 14 % (ref 10.4–31.8)
TIBC: 199 ug/dL — ABNORMAL LOW (ref 250–450)
UIBC: 171 ug/dL

## 2019-06-26 LAB — HEMOGLOBIN AND HEMATOCRIT, BLOOD
HCT: 29.6 % — ABNORMAL LOW (ref 36.0–46.0)
Hemoglobin: 8.6 g/dL — ABNORMAL LOW (ref 12.0–15.0)

## 2019-06-26 LAB — POC SARS CORONAVIRUS 2 AG -  ED: SARS Coronavirus 2 Ag: NEGATIVE

## 2019-06-26 LAB — CBG MONITORING, ED: Glucose-Capillary: 148 mg/dL — ABNORMAL HIGH (ref 70–99)

## 2019-06-26 LAB — FOLATE: Folate: 7.7 ng/mL (ref >5.9)

## 2019-06-26 LAB — BRAIN NATRIURETIC PEPTIDE: B Natriuretic Peptide: 175 pg/mL — ABNORMAL HIGH (ref 0.0–100.0)

## 2019-06-26 LAB — FERRITIN: Ferritin: 214 ng/mL (ref 11–307)

## 2019-06-26 LAB — ABO/RH: ABO/RH(D): A POS

## 2019-06-26 LAB — GLUCOSE, CAPILLARY: Glucose-Capillary: 268 mg/dL — ABNORMAL HIGH (ref 70–99)

## 2019-06-26 MED ORDER — ONDANSETRON HCL 4 MG/2ML IJ SOLN: 4.0000 mg | Freq: Four times a day (QID) | INTRAMUSCULAR | Status: DC | PRN

## 2019-06-26 MED ORDER — SODIUM CHLORIDE 0.9% FLUSH: 3.0000 mL | INTRAVENOUS | Status: DC | PRN

## 2019-06-26 MED ORDER — ATORVASTATIN CALCIUM 80 MG PO TABS: 80.0000 mg | ORAL_TABLET | Freq: Every day | ORAL | Status: DC

## 2019-06-26 MED ORDER — GABAPENTIN 300 MG PO CAPS
300.0000 mg | ORAL_CAPSULE | Freq: Every day | ORAL | Status: DC
  Administered 2019-06-26 – 2019-06-27 (×2): 300 mg via ORAL
  Filled 2019-06-26 (×2): qty 1

## 2019-06-26 MED ORDER — CLOPIDOGREL BISULFATE 75 MG PO TABS
75.0000 mg | ORAL_TABLET | Freq: Every day | ORAL | Status: DC
  Administered 2019-06-27 – 2019-07-04 (×8): 75 mg via ORAL
  Filled 2019-06-26 (×8): qty 1

## 2019-06-26 MED ORDER — ACETAMINOPHEN 325 MG PO TABS: 650.0000 mg | ORAL_TABLET | ORAL | Status: DC | PRN

## 2019-06-26 MED ORDER — CLOPIDOGREL BISULFATE 75 MG PO TABS: 75.0000 mg | ORAL_TABLET | Freq: Every day | ORAL | Status: DC

## 2019-06-26 MED ORDER — SODIUM CHLORIDE 0.9 % IV SOLN
250.0000 mL | INTRAVENOUS | Status: DC | PRN
  Administered 2019-06-27: 10:00:00 250 mL via INTRAVENOUS

## 2019-06-26 MED ORDER — MOMETASONE FURO-FORMOTEROL FUM 200-5 MCG/ACT IN AERO
2.0000 | INHALATION_SPRAY | Freq: Two times a day (BID) | RESPIRATORY_TRACT | Status: DC
  Administered 2019-06-26 – 2019-07-04 (×16): 2 via RESPIRATORY_TRACT
  Filled 2019-06-26: qty 8.8

## 2019-06-26 MED ORDER — ALBUTEROL SULFATE HFA 108 (90 BASE) MCG/ACT IN AERS
4.0000 | INHALATION_SPRAY | Freq: Once | RESPIRATORY_TRACT | Status: AC
  Administered 2019-06-26: 16:00:00 4 via RESPIRATORY_TRACT
  Filled 2019-06-26: qty 6.7

## 2019-06-26 MED ORDER — SPIRONOLACTONE 12.5 MG HALF TABLET
12.5000 mg | ORAL_TABLET | Freq: Every day | ORAL | Status: DC
  Administered 2019-06-27 – 2019-07-04 (×8): 12.5 mg via ORAL
  Filled 2019-06-26 (×8): qty 1

## 2019-06-26 MED ORDER — PANTOPRAZOLE SODIUM 20 MG PO TBEC
20.0000 mg | DELAYED_RELEASE_TABLET | Freq: Every day | ORAL | Status: DC
  Administered 2019-06-27 – 2019-07-04 (×8): 20 mg via ORAL
  Filled 2019-06-26 (×8): qty 1

## 2019-06-26 MED ORDER — POLYETHYLENE GLYCOL 3350 17 G PO PACK: 17.0000 g | PACK | Freq: Every day | ORAL | Status: DC | PRN

## 2019-06-26 MED ORDER — TRAZODONE HCL 100 MG PO TABS
100.0000 mg | ORAL_TABLET | Freq: Every evening | ORAL | Status: DC | PRN
  Administered 2019-06-26 – 2019-07-03 (×8): 100 mg via ORAL
  Filled 2019-06-26 (×8): qty 1

## 2019-06-26 MED ORDER — BUSPIRONE HCL 5 MG PO TABS
15.0000 mg | ORAL_TABLET | Freq: Two times a day (BID) | ORAL | Status: DC
  Administered 2019-06-26 – 2019-07-04 (×16): 15 mg via ORAL
  Filled 2019-06-26 (×16): qty 1

## 2019-06-26 MED ORDER — FUROSEMIDE 10 MG/ML IJ SOLN
40.0000 mg | Freq: Three times a day (TID) | INTRAMUSCULAR | Status: DC
  Administered 2019-06-27 – 2019-06-28 (×4): 40 mg via INTRAVENOUS
  Filled 2019-06-26 (×5): qty 4

## 2019-06-26 MED ORDER — AEROCHAMBER PLUS FLO-VU LARGE MISC
1.0000 | Freq: Once | Status: AC
  Administered 2019-06-26: 1

## 2019-06-26 MED ORDER — ATORVASTATIN CALCIUM 80 MG PO TABS
80.0000 mg | ORAL_TABLET | Freq: Every day | ORAL | Status: DC
  Administered 2019-06-27 – 2019-07-04 (×8): 80 mg via ORAL
  Filled 2019-06-26 (×8): qty 1

## 2019-06-26 MED ORDER — AMITRIPTYLINE HCL 25 MG PO TABS
25.0000 mg | ORAL_TABLET | Freq: Every day | ORAL | Status: DC
  Administered 2019-06-26 – 2019-07-03 (×8): 25 mg via ORAL
  Filled 2019-06-26 (×9): qty 1

## 2019-06-26 MED ORDER — IPRATROPIUM-ALBUTEROL 0.5-2.5 (3) MG/3ML IN SOLN
3.0000 mL | Freq: Four times a day (QID) | RESPIRATORY_TRACT | Status: DC
  Administered 2019-06-26 – 2019-06-27 (×5): 3 mL via RESPIRATORY_TRACT
  Filled 2019-06-26 (×5): qty 3

## 2019-06-26 MED ORDER — INSULIN GLARGINE 100 UNIT/ML ~~LOC~~ SOLN: 42.0000 [IU] | Freq: Two times a day (BID) | SUBCUTANEOUS | Status: DC

## 2019-06-26 MED ORDER — ENOXAPARIN SODIUM 40 MG/0.4ML ~~LOC~~ SOLN: 40.0000 mg | SUBCUTANEOUS | Status: DC

## 2019-06-26 MED ORDER — ENOXAPARIN SODIUM 40 MG/0.4ML ~~LOC~~ SOLN
40.0000 mg | SUBCUTANEOUS | Status: DC
  Administered 2019-06-27 – 2019-06-28 (×2): 40 mg via SUBCUTANEOUS
  Filled 2019-06-26 (×2): qty 0.4

## 2019-06-26 MED ORDER — OXYCODONE HCL 5 MG PO TABS
5.0000 mg | ORAL_TABLET | Freq: Once | ORAL | Status: AC
  Administered 2019-06-26: 5 mg via ORAL
  Filled 2019-06-26: qty 1

## 2019-06-26 MED ORDER — SODIUM CHLORIDE 0.9% FLUSH
3.0000 mL | Freq: Two times a day (BID) | INTRAVENOUS | Status: DC
  Administered 2019-06-26 – 2019-07-03 (×14): 3 mL via INTRAVENOUS

## 2019-06-26 MED ORDER — ASPIRIN 81 MG PO TBEC: 81.0000 mg | DELAYED_RELEASE_TABLET | Freq: Every day | ORAL | Status: DC

## 2019-06-26 MED ORDER — INSULIN GLARGINE 100 UNIT/ML ~~LOC~~ SOLN
48.0000 [IU] | Freq: Two times a day (BID) | SUBCUTANEOUS | Status: DC
  Administered 2019-06-26 – 2019-06-27 (×2): 48 [IU] via SUBCUTANEOUS
  Filled 2019-06-26 (×3): qty 0.48

## 2019-06-26 MED ORDER — FUROSEMIDE 10 MG/ML IJ SOLN
40.0000 mg | Freq: Once | INTRAMUSCULAR | Status: AC
  Administered 2019-06-26: 16:00:00 40 mg via INTRAVENOUS
  Filled 2019-06-26: qty 4

## 2019-06-26 MED ORDER — PREDNISONE 20 MG PO TABS
40.0000 mg | ORAL_TABLET | Freq: Every day | ORAL | Status: DC
  Administered 2019-06-27 – 2019-06-28 (×2): 40 mg via ORAL
  Filled 2019-06-26 (×3): qty 2

## 2019-06-26 MED ORDER — ACETAMINOPHEN 500 MG PO TABS
500.0000 mg | ORAL_TABLET | Freq: Once | ORAL | Status: AC
  Administered 2019-06-26: 16:00:00 500 mg via ORAL
  Filled 2019-06-26: qty 1

## 2019-06-26 MED ORDER — INSULIN ASPART 100 UNIT/ML ~~LOC~~ SOLN
24.0000 [IU] | Freq: Three times a day (TID) | SUBCUTANEOUS | Status: DC
  Administered 2019-06-27 (×2): 24 [IU] via SUBCUTANEOUS

## 2019-06-26 MED ORDER — FUROSEMIDE 10 MG/ML IJ SOLN
40.0000 mg | Freq: Once | INTRAMUSCULAR | Status: AC
  Administered 2019-06-26: 14:00:00 40 mg via INTRAVENOUS
  Filled 2019-06-26: qty 4

## 2019-06-26 MED ORDER — INSULIN ASPART 100 UNIT/ML ~~LOC~~ SOLN
0.0000 [IU] | Freq: Three times a day (TID) | SUBCUTANEOUS | Status: DC
  Administered 2019-06-26: 3 [IU] via SUBCUTANEOUS

## 2019-06-26 MED ORDER — ISOSORBIDE MONONITRATE ER 30 MG PO TB24
30.0000 mg | ORAL_TABLET | Freq: Every day | ORAL | Status: DC
  Administered 2019-06-27 – 2019-07-01 (×5): 30 mg via ORAL
  Filled 2019-06-26 (×5): qty 1

## 2019-06-26 MED ORDER — ACETAMINOPHEN 325 MG PO TABS
650.0000 mg | ORAL_TABLET | ORAL | Status: DC | PRN
  Administered 2019-06-26 – 2019-07-04 (×18): 650 mg via ORAL
  Filled 2019-06-26 (×19): qty 2

## 2019-06-26 NOTE — H&P (Signed)
Date: 06/26/2019               Patient Name:  Christine Sims MRN: [id number]  DOB: 27-Dec-1955 Age / Sex: 64 y.o., female   PCP: Kristopher Glee., MD         Medical Service: Internal Medicine Teaching Service         Attending Physician: Dr. Sid Falcon, MD    First Contact: Dr. Benjamine Mola Pager: [phone]  Second Contact: Dr. Truman Hayward Pager: [phone]       After Hours (After 5p/  First Contact Pager: [phone]  weekends / holidays): Second Contact Pager: [phone]   Chief Complaint: Shortness of breath  History of Present Illness:  Christine Sims is a 64 yo F w/ PMH of COPD, HFrEF, CKD who presents with shortness of breath. She states that she was in her usual state of health until 3-4 days ago when she began to develop shortness of breath with significant wheezing without obvious inciting events. She denies any rhinorrea, chest pain, palpiations, produdctive sputum, nausea or vomiting. She does mention some congestion.   For her COPD, she mentions that she has been using her rescue inhalers every day. She mentions being adherent to her maintenance inhalers every day  For her heart failure, she is on spironolactone and furosemide and she states that she has noticed increase in her weight and swelling. She forgets to take her water pills on average twice per week. Her dry weight is around 230s but recently her weight has increased to around 250lbs. She endorses orthopnea and sleeps on a reclining chair. She mentions feeling short of breath after walking to the bathroom from her chair.   Meds: Cardiovascular: *Aspirin 81 mg daily - patient states she is taking it, not on medication list from care everywhere *Plavix 75 mg daily *Atorvastatin 80 mg daily *Amlodipine 10 mg daily *Metoprolol (Toprol XL) 100 mg daily *Hydralazine 25 mg every 8 hours *Doxazosin 2 mg daily *Lasix 80 mg twice daily *Spironolactone 25mg  daily Respiratory: *Albuterol PRN *Symbicort 160-4.52 puffs twice  daily *Flonase 2 sprays daily *Claritin 10 mg daily Endocrine: *Aspart 30 units three times daily with meals *Glargine 60 units twice daily *Semaglutide 0.25 or 0.5 mg once weekly - unclear current dose GI: *Pantoprazole 20 mg daily *Famotidine 20 mg twice daily Neuro-Psych: *Buspirone 15 mg by mouth twice daily *Amitriptyline 25 mg at bedtime *Trazodone 20 mg at bedtime as needed for sleep Analgesic: *Acetaminophen *Gabapentin 300 mg at bedtime *Tizanidine 2 mg every evening  Allergies: Allergies as of 06/26/2019 - Review Complete 06/26/2019  Allergen Reaction Noted  . Keppra [levetiracetam] Anaphylaxis 11/03/2013  . Lisinopril Anaphylaxis 11/03/2013   Past Medical History:  Diagnosis Date  . Arthritis   . Asthma   . CHF (congestive heart failure) ([place])   . CKD (chronic kidney disease) stage 3, GFR 30-59 ml/min 02/25/2017  . COPD (chronic obstructive pulmonary disease) ([place])   . Depression 02/25/2017  . Diabetes mellitus without complication ([place])   . HTN (hypertension) 02/25/2017  . Hypercholesterolemia   . Hypertension   . MI (myocardial infarction) ([place])   . Shortness of breath dyspnea   . Stroke [facility])    R sided weakness    Family History:  Mother has diabetes. No known relatives with lung disease.  Social History:  Lives with daughter. Able to perform all ADLs. Mentions lot of young people around her home who smokes tobacco and marijuana. Used to drink alcohol but denies  current usage. Has 30-45 pack year smoking history but quit about 8-9 years ago. Denies any current illicit substance use but used to do cocaine and marijuana, denies past or present IV drug use.   Review of Systems: A complete ROS was negative except as per HPI.   Physical Exam: Blood pressure 102/72, pulse 80, temperature 97.7 F (36.5 C), temperature source Oral, resp. rate 16, height 5\' 4"  (1.626 m), weight 111 kg, SpO2 100 %. Physical Exam  Constitutional: She is well-developed,  well-nourished, and in no distress.  HENT:  Head: Normocephalic and atraumatic.  Eyes: EOM are normal. Right eye exhibits no discharge. Left eye exhibits no discharge.  Neck: No tracheal deviation present.  Cardiovascular: Normal rate and regular rhythm. Exam reveals no gallop and no friction rub.  No murmur heard. Pulmonary/Chest: Effort normal. No respiratory distress. She has wheezes. She has rales.  Abdominal: Soft. She exhibits no distension. There is no abdominal tenderness. There is no rebound and no guarding.  Musculoskeletal:        General: No tenderness, deformity or edema. Normal range of motion.     Cervical back: Normal range of motion.     Comments: Wound on right lower extremity as pictured below, no drainage noted  Neurological: She is alert. Coordination normal.  Skin: Skin is warm and dry. No rash noted. She is not diaphoretic. No erythema.  Psychiatric: Memory and judgment normal.   EKG: personally reviewed my interpretation is NSR, no signs of acute ischemia  CXR: personally reviewed my interpretation is increased prominence of pulmonary vasculature  TTE (04/17/2019): SUMMARY There is LV apical and inferior wall hypokinesis LV ejection fraction = 30-35%. The left atrium is dilated. Moderate pulmonary hypertension. There is trace tricuspid regurgitation. There is no significant change in comparison with the last study.  Assessment & Plan by Problem: Principal Problem:   Acute on chronic HFrEF (heart failure with reduced ejection fraction) [facility])  Patient is a 64 year old female with past medical history significant for HFrEF, CAD s/pt stents, COPD, asthma, HTN, and depression who presented on 06/26/19 with a 4 day history of worsening shortness of breath.  # Acute on chronic systolic heart failure TTE from 04/17/2019 reveals EF 30-35%. Patient with worsening shortness of breath over past 4 days, up 20 pounds from dry weight per patient, pitting edema up to thighs  bilaterally. This occurs in the setting of missed doses and dietary discretion, although these factors appear chronic.  Patient does appear to have a mild COPD exacerbation with diffuse bilateral wheezing, but patient symptoms appear to be primarily secondary to volume overload. Patient on PO Lasix 80 mg twice daily + spironolactone 25 mg daily at home.  Patient given 40 mg IV Lasix in ER with good urine output. *Continue Lasix 40 mg 3 times daily *Strict I/Os, daily weights *Daily BMP, goal K>4.0, Mag>2.0 *Monitor renal function closely during diuresis  # COPD Patient with worsening shortness of breath over past 4 days, appears mostly secondary to volume overload.  Home regimen of Symbicort 160-4.5 two puffs twice daily+albuterol PRN. Bilateral wheezing on exam. No cough or increased sputum production. No indication for antibiotic therapy *Prednisone 40 mg daily *Dulera 200-5 2 puff twice daily + Duonebs PRN  # Diabetes mellitus: Home regimen of Glargine 60 units twice daily + Aspart 30 units three times daily with meals. *Will start glargine 48 units twice daily + Aspart 24 units three times daily with meals + SSI *Continue gabapentin 300 mg at bedtime  for neuropathy  # CAD s/p stents: Continue home Plavix 75 mg daily  # HTN: Home BP regimen of amlodipine 10 mg daily + metoprolol (toprol XL)100 mg daily + hydralazine 25 mg every 8 hours + doxazosin 2 mg daily. Soft blood pressures on admission. Will hold home antihypertensives.  # Depression/Anxiety: Continue home buspirone 15 mg twice daily + amitriptyline 25 mg at bedtime + trazodone 20 mg at bedtime as needed for sleep  # GERD: Continue home pantoprazole 20 mg daily  PT/OT: Consulted Diet: Heart healthy, carb modified DVT Ppx: Lovenox 40 mg daily CODE Status: FULL CODE Dispo: Admit patient to Observation with expected length of stay less than 2 midnights.  Signed: Jeanmarie [place]ert, MD 06/26/2019, 4:49 PM

## 2019-06-26 NOTE — Progress Notes (Signed)
Pt refusing cpap for the night. RT will continue to monitor as needed. 

## 2019-06-26 NOTE — ED Triage Notes (Signed)
From PCP office. SOBx3 days. Home 02 @3L . Audible wheezing. Albuterol x2 via EMS. 85% in route on NRB. Pt placed on 5L [place] upon arrival, 98%. Pt did not tolerate CPAP in route. Pt has compression bandages on bilateral LEs secondary to wounds and edema.

## 2019-06-26 NOTE — ED Provider Notes (Signed)
[facility] EMERGENCY DEPARTMENT Provider Note   CSN: [id number] Arrival date & time: 06/26/19  1214     History Chief Complaint  Patient presents with  . Shortness of Breath    Christine Sims is a 64 y.o. female with a past medical history of CHF, COPD, CKD, DM, hypertension, stroke, respiratory failure wearing 3 L of oxygen at home, morbid obesity, has not been vaccinated against Covid, who presents today for evaluation from her PCPs office for shortness of breath for the past 3 days.  She reports that she has not been taking her Lasix and has missed multiple doses.  She says that she is unable to tell me how many just states "a lot."  She was trialed on CPAP by EMS as she was in the high 80s however reportedly did not tolerate that.    HPI     Past Medical History:  Diagnosis Date  . Arthritis   . Asthma   . CHF (congestive heart failure) ([place])   . CKD (chronic kidney disease) stage 3, GFR 30-59 ml/min 02/25/2017  . COPD (chronic obstructive pulmonary disease) ([place])   . Depression 02/25/2017  . Diabetes mellitus without complication ([place])   . HTN (hypertension) 02/25/2017  . Hypercholesterolemia   . Hypertension   . MI (myocardial infarction) ([place])   . Shortness of breath dyspnea   . Stroke [facility])    R sided weakness    Patient Active Problem List   Diagnosis Date Noted  . Acute on chronic respiratory failure ([place])   . Uncontrolled type 2 diabetes mellitus with hyperglycemia ([place]) 05/09/2019  . Morbid obesity ([place]) 05/09/2019  . Acute exacerbation of CHF (congestive heart failure) ([place]) 05/05/2019  . COPD with acute exacerbation ([place]) 02/25/2017  . Rhinovirus infection 02/25/2017  . HTN (hypertension) 02/25/2017  . Chronic combined systolic and diastolic heart failure ([place]) 02/25/2017  . CKD (chronic kidney disease) stage 3, GFR 30-59 ml/min 02/25/2017  . Diabetes mellitus, insulin dependent (IDDM), controlled 02/25/2017  . Depression 02/25/2017   . COPD (chronic obstructive pulmonary disease) ([place]) 02/18/2017  . Community acquired pneumonia 04/04/2015    Past Surgical History:  Procedure Laterality Date  . CARDIAC SURGERY    . FOOT SURGERY     bilateral     OB History    Gravida      Para      Term      Preterm      AB      Living  2     SAB      TAB      Ectopic      Multiple      Live Births              Family History  Problem Relation Age of Onset  . Diabetes Other   . Hypertension Other     Social History   Tobacco Use  . Smoking status: Former Smoker    Types: Cigarettes  . Smokeless tobacco: Never Used  Substance Use Topics  . Alcohol use: Yes  . Drug use: No    Home Medications Prior to Admission medications   Medication Sig Start Date End Date Taking? Authorizing Provider  acetaminophen (TYLENOL) 500 MG tablet Take 1,000 mg by mouth every 6 (six) hours as needed for headache (pain).    [provider]  albuterol (PROVENTIL HFA;VENTOLIN HFA) 108 (90 Base) MCG/ACT inhaler Inhale 2 puffs into the lungs every 4 (four) hours as  needed for wheezing or shortness of breath. 04/15/15   Loletha Grayer, MD  albuterol (PROVENTIL) (2.5 MG/3ML) 0.083% nebulizer solution Take 3 mLs (2.5 mg total) by nebulization every 4 (four) hours as needed for wheezing or shortness of breath. For wheezing. Use 3 times daily x 3 days then ecery 4 hours as needed. 02/26/17 12/16/24  Eugenie Filler, MD  amitriptyline (ELAVIL) 25 MG tablet Take 25 mg by mouth at bedtime. 04/10/19   [provider]  amLODipine (NORVASC) 10 MG tablet Take 10 mg by mouth daily.    [provider]  aspirin 81 MG EC tablet Take 81 mg by mouth daily.    [provider]  atorvastatin (LIPITOR) 80 MG tablet Take 80 mg by mouth daily.     [provider]  blood glucose meter kit and supplies Dispense based on patient and insurance preference. Use up to four times daily as directed. (FOR  ICD-10 E10.9, E11.9). 02/26/17   Eugenie Filler, MD  budesonide-formoterol [facility]) 160-4.5 MCG/ACT inhaler Inhale 2 puffs into the lungs 2 (two) times daily. 04/15/15   Loletha Grayer, MD  busPIRone (BUSPAR) 15 MG tablet Take 15 mg by mouth 2 (two) times daily. 04/10/19   [provider]  clopidogrel (PLAVIX) 75 MG tablet Take 75 mg by mouth daily.    [provider]  diclofenac sodium (VOLTAREN) 1 % GEL Apply 2 g topically 2 (two) times daily as needed (pain).    [provider]  doxazosin (CARDURA) 2 MG tablet Take 2 mg by mouth daily. 04/10/19   [provider]  famotidine (PEPCID) 20 MG tablet Take 20 mg by mouth 2 (two) times daily.    [provider]  fluticasone (FLONASE) 50 MCG/ACT nasal spray Place 2 sprays into both nostrils daily. Patient not taking: Reported on 05/05/2019 02/27/17   Eugenie Filler, MD  furosemide (LASIX) 80 MG tablet Take 1 tablet (80 mg total) by mouth 2 (two) times daily. 05/14/19 05/13/20  Mitzi Hansen, MD  furosemide (LASIX) 80 MG tablet Take 1 tablet (80 mg total) by mouth 2 (two) times daily. 05/14/19   Mitzi Hansen, MD  gabapentin (NEURONTIN) 300 MG capsule Take 300 mg by mouth at bedtime.     [provider]  hydrALAZINE (APRESOLINE) 25 MG tablet Take 1 tablet (25 mg total) by mouth every 8 (eight) hours. 05/14/19   Mitzi Hansen, MD  insulin aspart (NOVOLOG FLEXPEN) 100 UNIT/ML FlexPen Inject 30 Units into the skin 3 (three) times daily with meals.    [provider]  Insulin Glargine (LANTUS SOLOSTAR) 100 UNIT/ML Solostar Pen Inject 60 Units into the skin every 12 (twelve) hours. 12/30/18   [provider]  insulin glargine (LANTUS) 100 unit/mL SOPN Inject 0.7 mLs (70 Units total) into the skin 2 (two) times daily. 02/26/17   Eugenie Filler, MD  isosorbide mononitrate (IMDUR) 30 MG 24 hr tablet Take 30 mg by mouth daily. 04/23/19 05/23/19  [provider]  loratadine  (CLARITIN) 10 MG tablet Take 1 tablet (10 mg total) by mouth daily. Patient not taking: Reported on 05/05/2019 02/27/17   Eugenie Filler, MD  metoprolol succinate (TOPROL-XL) 50 MG 24 hr tablet Take 100 mg by mouth daily. Take with or immediately following a meal.    [provider]  montelukast (SINGULAIR) 4 MG chewable tablet Chew 4 mg by mouth every evening. 04/22/19 05/22/19  [provider]  nystatin (MYCOSTATIN/NYSTOP) powder Apply 1 application topically in the  morning and at bedtime. 04/11/19   [provider]  OXYGEN Inhale 2 L into the lungs continuous.    [provider]  pantoprazole (PROTONIX) 20 MG tablet Take 20 mg by mouth daily. 04/10/19   [provider]  Semaglutide (OZEMPIC) 0.25 or 0.5 MG/DOSE SOPN Inject 0.25-0.5 mg into the skin See admin instructions. Inject 0.'25mg'$  once weekly for two weeks, then increase to 0.'5mg'$  weekly. Mondays    [provider]  spironolactone (ALDACTONE) 25 MG tablet Take 0.5 tablets (12.5 mg total) by mouth daily. 05/14/19   Mitzi Hansen, MD  tiZANidine (ZANAFLEX) 2 MG tablet Take 2 mg by mouth every evening. 03/11/19   [provider]  traZODone (DESYREL) 100 MG tablet Take 100 mg by mouth at bedtime as needed for sleep.  04/11/19   [provider]    Allergies    Keppra [levetiracetam] and Lisinopril  Review of Systems   Review of Systems  Constitutional: Positive for fatigue. Negative for chills and fever.  Respiratory: Positive for chest tightness and shortness of breath.   Cardiovascular: Positive for leg swelling (Chronic, ). Negative for chest pain.  Skin: Positive for wound (BLE).  Neurological: Positive for speech difficulty (Due to shob). Negative for headaches.  All other systems reviewed and are negative.   Physical Exam Updated Vital Signs BP 126/72   Pulse 76   Temp 97.7 F (36.5 C) (Oral)   Resp (!) 23   Ht '5\' 4"'$  (1.626 m)   Wt 111 kg   SpO2 100%   BMI  42.00 kg/m   Physical Exam Vitals and nursing note reviewed.  Constitutional:      Appearance: She is obese. She is ill-appearing.  HENT:     Head: Normocephalic and atraumatic.     Mouth/Throat:     Mouth: Mucous membranes are moist.     Pharynx: No pharyngeal swelling.  Eyes:     General: No scleral icterus.       Right eye: No discharge.        Left eye: No discharge.     Conjunctiva/sclera: Conjunctivae normal.  Cardiovascular:     Rate and Rhythm: Normal rate and regular rhythm.  Pulmonary:     Effort: Tachypnea and accessory muscle usage present.     Breath sounds: No stridor.     Comments: She has audible expiratory wheezing without stethoscope. On auscultation she has generally decreased lung sounds with coarse Rales bilaterally and end expiratory wheezes.  She has 3-4 word dyspnea.  Sitting upright.  Chest:     Chest wall: No tenderness.  Abdominal:     General: There is no distension.     Palpations: Abdomen is soft.     Tenderness: There is no abdominal tenderness.  Musculoskeletal:        General: No deformity.     Cervical back: Normal range of motion.     Right lower leg: Tenderness present.     Left lower leg: Tenderness present.     Comments: BLE wraped in compression dressings.  She requested removal of the left dressing.  Under that she has at least 2 wounds, these are being treated by wound care.  Skin:    General: Skin is warm and dry.  Neurological:     Mental Status: She is alert and oriented to person, place, and time.     Motor: No abnormal muscle tone.  Psychiatric:        Mood and Affect: Mood normal.  Behavior: Behavior normal.     ED Results / Procedures / Treatments   Labs (all labs ordered are listed, but only abnormal results are displayed) Labs Reviewed  COMPREHENSIVE METABOLIC PANEL - Abnormal; Notable for the following components:      Result Value   Glucose, Bld 191 (*)    BUN 24 (*)    Creatinine, Ser 1.75 (*)     Albumin 2.8 (*)    AST 10 (*)    GFR calc non Af Amer 31 (*)    GFR calc Af Amer 36 (*)    All other components within normal limits  CBC WITH DIFFERENTIAL/PLATELET - Abnormal; Notable for the following components:   RBC 2.59 (*)    Hemoglobin 6.8 (*)    HCT 22.4 (*)    RDW 16.7 (*)    All other components within normal limits  BRAIN NATRIURETIC PEPTIDE - Abnormal; Notable for the following components:   B Natriuretic Peptide 175.0 (*)    All other components within normal limits  IRON AND TIBC - Abnormal; Notable for the following components:   TIBC 199 (*)    All other components within normal limits  RETICULOCYTES - Abnormal; Notable for the following components:   RBC. 3.31 (*)    All other components within normal limits  HEMOGLOBIN AND HEMATOCRIT, BLOOD - Abnormal; Notable for the following components:   Hemoglobin 8.6 (*)    HCT 29.6 (*)    All other components within normal limits  POCT I-STAT 7, (LYTES, BLD GAS, ICA,H+H) - Abnormal; Notable for the following components:   pO2, Arterial 129 (*)    HCT 27.0 (*)    Hemoglobin 9.2 (*)    All other components within normal limits  TROPONIN I (HIGH SENSITIVITY) - Abnormal; Notable for the following components:   Troponin I (High Sensitivity) 19 (*)    All other components within normal limits  TROPONIN I (HIGH SENSITIVITY) - Abnormal; Notable for the following components:   Troponin I (High Sensitivity) 20 (*)    All other components within normal limits  RESPIRATORY PANEL BY RT PCR (FLU A&B, COVID)  LACTIC ACID, PLASMA  LACTIC ACID, PLASMA  VITAMIN B12  FOLATE  FERRITIN  POC SARS CORONAVIRUS 2 AG -  ED  I-STAT ARTERIAL BLOOD GAS, ED  TYPE AND SCREEN  ABO/RH    EKG EKG Interpretation  Date/Time:  Thursday June 26 2019 12:23:43 EDT Ventricular Rate:  78 PR Interval:    QRS Duration: 94 QT Interval:  403 QTC Calculation: 459 R Axis:   2 Text Interpretation: Sinus rhythm Nonspecific T abnormalities, lateral  leads No STEMI Confirmed by Octaviano Glow [phone]) on 06/26/2019 12:46:21 PM   Radiology DG Chest Port 1 View  Result Date: 06/26/2019 CLINICAL DATA:  Shortness of breath for the past 3 days. EXAM: PORTABLE CHEST 1 VIEW COMPARISON:  Chest x-ray dated May 05, 2019. FINDINGS: Unchanged cardiomegaly. Normal pulmonary vascularity. Improved aeration at the lung bases. No focal consolidation, pleural effusion, or pneumothorax. No acute osseous abnormality. IMPRESSION: No active disease. Electronically Signed   By: Titus Dubin M.D.   On: 06/26/2019 13:36    Procedures Procedures (including critical care time)  Medications Ordered in ED Medications  furosemide (LASIX) injection 40 mg (has no administration in time range)  acetaminophen (TYLENOL) tablet 500 mg (has no administration in time range)  albuterol (VENTOLIN HFA) 108 (90 Base) MCG/ACT inhaler 4 puff (has no administration in time range)  AeroChamber Plus Flo-Vu Large  MISC 1 each (has no administration in time range)  furosemide (LASIX) injection 40 mg (40 mg Intravenous Given 06/26/19 1404)    ED Course  I have reviewed the triage vital signs and the nursing notes.  Pertinent labs & imaging results that were available during my care of the patient were reviewed by me and considered in my medical decision making (see chart for details).  Clinical Course as of Jun 26 2115  Thu Apr 22, 238  [id number] 64 year old female with a history of congestive heart failure who was just discharged in the hospital for CHF exacerbation approximately 1 month ago, presenting to ED with shortness of breath.  The patient reports that she has not been compliant with her Lasix and has missed several doses.  She cannot quantify how many.  She is normally on 80 mg twice a day.  On exam the patient does have coarse crackles throughout her lung fields.  She is requiring supplemental oxygen.  Suspect this another CHF exacerbation she will need IV diuresis.   Unfortunately she will need to become back into the hospital.   [MT]  1627 Hemoglobin(!): 9.2 [EH]  1627 Sample type: ARTERIAL [EH]    Clinical Course User Index [EH] Lorin Glass, PA-C [MT] Langston Masker, Carola Rhine, MD   MDM Rules/Calculators/A&P                      Patient is a 64 year old woman who presents today for evaluation of shortness of breath.  She was discharged from the hospital about 1 month ago and reports that she has missed multiple doses of Lasix however is unable to tell me how many.  On exam she appears to be in mild respiratory distress with increased respiratory effort, mildly tachypneic and dyspnea of 4-5 words.  She actually had CBC but appeared to be anemic with a hemoglobin of 6.8, however her i-STAT ABG reported hemoglobin of 9.2.  Given the significant discrepancy repeat H&H was ordered showing a hemoglobin of 8.6, she does not require blood transfusion at this time and this appears consistent with her baseline.  Her ABGs shows her PO2 is elevated, she was briefly on nonrebreather with EMS and they trialed CPAP however she would not tolerate it.  CMP shows elevated creatinine which appears consistent with her baseline.  BNP is not significantly elevated compared to previous at 175, however given her noncompliance with Lasix proceeding her shortness of breath clinically very high suspicion for CHF exacerbation.  Chest x-ray without evidence of consolidation or other abnormalities.  She is treated with IV Lasix, albuterol.  Covid antigen test is negative, Covid PCR test is pending.  ABG does not show significant oxygen retention at this time.    This patient was seen as a shared visit with Dr. Langston Masker.  Given her overall appearance with increased work of breathing, her generally fragile medical state at baseline and her noncompliance I do not think it is currently safe to send her home at this time.  Recommended medical admission.  I spoke with hospitalist who will see  patient.  Note: Portions of this report may have been transcribed using voice recognition software. Every effort was made to ensure accuracy; however, inadvertent computerized transcription errors may be present    Final Clinical Impression(s) / ED Diagnoses Final diagnoses:  Acute on chronic respiratory failure with hypoxia (HCC)  Acute on chronic combined systolic and diastolic congestive heart failure [facility])  Medical non-compliance  Non-compliance  Chronic obstructive  pulmonary disease with acute exacerbation [facility])    Rx / DC Orders ED Discharge Orders    None       Ollen Gross 06/26/19 2117    Wyvonnia Dusky, MD 06/27/19 [phone]

## 2019-06-27 ENCOUNTER — Other Ambulatory Visit: Payer: Self-pay | Admitting: Internal Medicine

## 2019-06-27 ENCOUNTER — Encounter (HOSPITAL_COMMUNITY): Payer: Self-pay | Admitting: Internal Medicine

## 2019-06-27 ENCOUNTER — Other Ambulatory Visit: Payer: Self-pay

## 2019-06-27 LAB — BASIC METABOLIC PANEL
Anion gap: 11 (ref 5–15)
Anion gap: 11 (ref 5–15)
BUN: 26 mg/dL — ABNORMAL HIGH (ref 8–23)
BUN: 30 mg/dL — ABNORMAL HIGH (ref 8–23)
CO2: 24 mmol/L (ref 22–32)
CO2: 23 mmol/L (ref 22–32)
Calcium: 8.9 mg/dL (ref 8.9–10.3)
Calcium: 8.9 mg/dL (ref 8.9–10.3)
Chloride: 104 mmol/L (ref 98–111)
Chloride: 102 mmol/L (ref 98–111)
Creatinine, Ser: 1.77 mg/dL — ABNORMAL HIGH (ref 0.44–1.00)
Creatinine, Ser: 1.94 mg/dL — ABNORMAL HIGH (ref 0.44–1.00)
GFR calc Af Amer: 35 mL/min — ABNORMAL LOW (ref >60)
GFR calc Af Amer: 31 mL/min — ABNORMAL LOW (ref >60)
GFR calc non Af Amer: 30 mL/min — ABNORMAL LOW (ref >60)
GFR calc non Af Amer: 27 mL/min — ABNORMAL LOW (ref >60)
Glucose, Bld: 239 mg/dL — ABNORMAL HIGH (ref 70–99)
Glucose, Bld: 462 mg/dL — ABNORMAL HIGH (ref 70–99)
Potassium: 4 mmol/L (ref 3.5–5.1)
Potassium: 4.5 mmol/L (ref 3.5–5.1)
Sodium: 139 mmol/L (ref 135–145)
Sodium: 136 mmol/L (ref 135–145)

## 2019-06-27 LAB — GLUCOSE, CAPILLARY
Glucose-Capillary: 214 mg/dL — ABNORMAL HIGH (ref 70–99)
Glucose-Capillary: 322 mg/dL — ABNORMAL HIGH (ref 70–99)
Glucose-Capillary: 411 mg/dL — ABNORMAL HIGH (ref 70–99)
Glucose-Capillary: 484 mg/dL — ABNORMAL HIGH (ref 70–99)

## 2019-06-27 LAB — CBC
HCT: 30 % — ABNORMAL LOW (ref 36.0–46.0)
Hemoglobin: 9 g/dL — ABNORMAL LOW (ref 12.0–15.0)
MCH: 25.6 pg — ABNORMAL LOW (ref 26.0–34.0)
MCHC: 30 g/dL (ref 30.0–36.0)
MCV: 85.2 fL (ref 80.0–100.0)
Platelets: 215 10*3/uL (ref 150–400)
RBC: 3.52 MIL/uL — ABNORMAL LOW (ref 3.87–5.11)
RDW: 16.6 % — ABNORMAL HIGH (ref 11.5–15.5)
WBC: 5.3 10*3/uL (ref 4.0–10.5)
nRBC: 0 % (ref 0.0–0.2)

## 2019-06-27 LAB — MAGNESIUM: Magnesium: 1.9 mg/dL (ref 1.7–2.4)

## 2019-06-27 MED ORDER — INSULIN GLARGINE 100 UNIT/ML ~~LOC~~ SOLN
60.0000 [IU] | Freq: Two times a day (BID) | SUBCUTANEOUS | Status: DC
  Administered 2019-06-27 – 2019-06-28 (×2): 60 [IU] via SUBCUTANEOUS
  Filled 2019-06-27 (×3): qty 0.6

## 2019-06-27 MED ORDER — MAGNESIUM SULFATE IN D5W 1-5 GM/100ML-% IV SOLN
1.0000 g | Freq: Once | INTRAVENOUS | Status: AC
  Administered 2019-06-27: 10:00:00 1 g via INTRAVENOUS
  Filled 2019-06-27: qty 100

## 2019-06-27 MED ORDER — AMLODIPINE BESYLATE 10 MG PO TABS
10.0000 mg | ORAL_TABLET | Freq: Every day | ORAL | Status: DC
  Administered 2019-06-27 – 2019-07-04 (×8): 10 mg via ORAL
  Filled 2019-06-27 (×8): qty 1

## 2019-06-27 MED ORDER — INSULIN ASPART 100 UNIT/ML ~~LOC~~ SOLN
0.0000 [IU] | Freq: Three times a day (TID) | SUBCUTANEOUS | Status: DC
  Administered 2019-06-27: 20 [IU] via SUBCUTANEOUS
  Administered 2019-06-27: 06:00:00 7 [IU] via SUBCUTANEOUS
  Administered 2019-06-27: 15 [IU] via SUBCUTANEOUS
  Administered 2019-06-28: 13:00:00 20 [IU] via SUBCUTANEOUS
  Administered 2019-06-28: 17:00:00 15 [IU] via SUBCUTANEOUS
  Administered 2019-06-28: 07:00:00 4 [IU] via SUBCUTANEOUS

## 2019-06-27 MED ORDER — OXYCODONE HCL 5 MG PO TABS
5.0000 mg | ORAL_TABLET | Freq: Once | ORAL | Status: DC
  Filled 2019-06-27 (×2): qty 1

## 2019-06-27 MED ORDER — INSULIN ASPART 100 UNIT/ML ~~LOC~~ SOLN
30.0000 [IU] | Freq: Three times a day (TID) | SUBCUTANEOUS | Status: DC
  Administered 2019-06-27 – 2019-06-28 (×3): 30 [IU] via SUBCUTANEOUS

## 2019-06-27 MED ORDER — INSULIN ASPART 100 UNIT/ML ~~LOC~~ SOLN
20.0000 [IU] | Freq: Once | SUBCUTANEOUS | Status: AC
  Administered 2019-06-27: 23:00:00 20 [IU] via SUBCUTANEOUS

## 2019-06-27 NOTE — Progress Notes (Signed)
Patient states she still wants something for pain other than tylenol.  New UNNA Boots in place, patient has remained in the chair since start of shift, where she also slept last night. Though pt reports inability to sleep as the reason she is so sleepy and only awake for meals/treatments today.   Page to Internal Medicine to relay patient's request, update on pt presentation provided at this time as well.

## 2019-06-27 NOTE — Evaluation (Signed)
Physical Therapy Evaluation Patient Details Name: Christine Sims MRN: [id number] DOB: January 04, 1956 Today's Date: 06/27/2019   History of Present Illness  64 yo F w/ PMH of COPD, HFrEF, CKD who presents with shortness of breath. Admitted for acute on chronic heart failure.    Clinical Impression  Pt admitted with above diagnosis. PTA pt lived at home with daughter. She ambulates very short distances mod I using rollator. She sleeps in a lift chair and is on 3L O2 at baseline. On eval, she required min guard assist transfers and ambulation 10' x 2. Pt ambulated to/from [facility] using window sill for support. Pt mobilized on 3L with SpO2 96%. 2/4 DOE noted. Pt currently with functional limitations due to the deficits listed below (see PT Problem List). Pt will benefit from skilled PT to increase their independence and safety with mobility to allow discharge to the venue listed below.       Follow Up Recommendations Home health PT;Supervision/Assistance - 24 hour    Equipment Recommendations  None recommended by PT    Recommendations for Other Services       Precautions / Restrictions Precautions Precautions: Other (comment);Fall Precaution Comments: watch sats      Mobility  Bed Mobility               General bed mobility comments: Pt sleeping in recliner while in hospital. She sleeps in lift chair at home.  Transfers Overall transfer level: Needs assistance Equipment used: None Transfers: Sit to/from Stand Sit to Stand: Min guard         General transfer comment: increased time to stabilize balance  Ambulation/Gait Ambulation/Gait assistance: Min guard Gait Distance (Feet): 10 Feet(x 2) Assistive device: (Pt using window sill for support.) Gait Pattern/deviations: Step-through pattern;Decreased stride length Gait velocity: decreased   General Gait Details: Pt mobilized on 3L O2 with SpO2 96%. Only agreeable to ambulate to/from [facility] due to lethargy. 2/4 DOE  Marine scientist [place] (Stroke Patients Only)       Balance Overall balance assessment: Needs assistance Sitting-balance support: No upper extremity supported;Feet supported Sitting balance-Leahy Scale: Good     Standing balance support: During functional activity;Single extremity supported Standing balance-Leahy Scale: Fair                               Pertinent Vitals/Pain Pain Assessment: Faces Faces Pain Scale: Hurts little more Pain Location: R distal LE Pain Descriptors / Indicators: Sore Pain Intervention(s): Monitored during session    Home Living Family/patient expects to be discharged to:: Private residence Living Arrangements: Children Available Help at Discharge: Family;Available 24 hours/day Type of Home: House Home Access: Level entry     Home Layout: One level Home Equipment: Tub bench;Hospital bed;Walker - 4 wheels;Cane - single point;Other (comment);Bedside commode Additional Comments: per chart [facility] for IADL assist    Prior Function Level of Independence: Independent with assistive device(s)         Comments: rollator for mobility, walks only to/from bathroom. Sleeps in lift chair. Per pt, active with HHPT on admission.     Hand Dominance   Dominant Hand: Right    Extremity/Trunk Assessment   Upper Extremity Assessment Upper Extremity Assessment: Defer to OT evaluation    Lower Extremity Assessment Lower Extremity Assessment: Generalized weakness    Cervical / Trunk Assessment Cervical / Trunk Assessment:  Normal  Communication   Communication: No difficulties  Cognition Arousal/Alertness: Lethargic;Suspect due to medications Behavior During Therapy: Flat affect Overall Cognitive Status: Within Functional Limits for tasks assessed                                 General Comments: Pt reports she just took medicine and it is making her sleepy.      General Comments  General comments (skin integrity, edema, etc.): Pt on 3L O2 via [place].    Exercises     Assessment/Plan    PT Assessment Patient needs continued PT services  PT Problem List Decreased strength;Decreased mobility;Decreased activity tolerance;Cardiopulmonary status limiting activity;Pain;Decreased balance       PT Treatment Interventions DME instruction;Therapeutic activities;Gait training;Therapeutic exercise;Patient/family education;Balance training;Functional mobility training    PT Goals (Current goals can be found in the Care Plan section)  Acute Rehab PT Goals Patient Stated Goal: not stated PT Goal Formulation: With patient Time For Goal Achievement: 07/11/19 Potential to Achieve Goals: Fair    Frequency Min 3X/week   Barriers to discharge        Co-evaluation               AM-PAC PT "6 Clicks" Mobility  Outcome Measure Help needed turning from your back to your side while in a flat bed without using bedrails?: A Little Help needed moving from lying on your back to sitting on the side of a flat bed without using bedrails?: A Lot Help needed moving to and from a bed to a chair (including a wheelchair)?: A Little Help needed standing up from a chair using your arms (e.g., wheelchair or bedside chair)?: None Help needed to walk in hospital room?: A Little Help needed climbing 3-5 steps with a railing? : A Lot 6 Click Score: 17    End of Session Equipment Utilized During Treatment: Oxygen Activity Tolerance: Patient limited by lethargy Patient left: in chair;with call bell/phone within reach Nurse Communication: Mobility status PT Visit Diagnosis: Muscle weakness (generalized) (M62.81);Difficulty in walking, not elsewhere classified (R26.2)    Time: 1761-6073 PT Time Calculation (min) (ACUTE ONLY): 11 min   Charges:   PT Evaluation $PT Eval Moderate Complexity: 1 Mod          Lorrin Goodell, PT  Office # [phone] Pager [phone]   Lorriane Shire 06/27/2019, 11:05 AM

## 2019-06-27 NOTE — Progress Notes (Signed)
Pt refuses the CPAP while in hospital. Patient states she does not like taking the breathing treatments because we do not have the type of hand held nebulizer she likes.

## 2019-06-27 NOTE — Consult Note (Signed)
[facility] Nurse Consult Note: Patient receiving care in [place]. Reason for Consult: right vascular leg ulcer Wound type: venous stasis Pressure Injury POA: Yes/No/NA Measurement: 7 cm x 3.1 cm Wound bed: 75% yellow - tan - white slough Drainage (amount, consistency, odor) yellow on existing foam dressing Periwound: intact Dressing procedure/placement/frequency: BLE unna boots.  LLE without wounds.  RLE with one medial wound--this wound gets calcium alginate and foam dressing, then an unna boot. Thank you for the consult.  Discussed plan of care with the patient and bedside nurse.  [place] nurse will not follow at this time.  Please re-consult the [place] team if needed.  Val Riles, RN, MSN, CWOCN, CNS-BC, pager [phone]

## 2019-06-27 NOTE — Progress Notes (Signed)
Patient c/a/ox4 during shift report. Requesting replacement tray for breakfast. Pt refuses tylenol during shift report.

## 2019-06-27 NOTE — Evaluation (Signed)
Occupational Therapy Evaluation Patient Details Name: Christine Sims MRN: [id number] DOB: June 22, 1955 Today's Date: 06/27/2019    History of Present Illness 64 yo F w/ PMH of COPD, HFrEF, CKD who presents with shortness of breath. Admitted for acute on chronic heart failure.   Clinical Impression   PTA pt living at home with family, with as needed assist for BADL and assist for IADL. Pt with recent re admissions to hospital. Pt presents minimally engaged and fatigued this session, needing max encouragement to participate. Pt stays in recliner to sleep due to sleeping in lift chair at home. Pt able to complete sit <> stand with min guard and increased effort. Noted poor activity tolerance and understanding/implementation of ECS skills throughout session. Recommend HHOT at d/c to progress safe BADL and incorporate ECS strategies. Will continue to follow per POC listed below.    Follow Up Recommendations  Home health OT;Supervision/Assistance - 24 hour    Equipment Recommendations  3 in 1 bedside commode    Recommendations for Other Services       Precautions / Restrictions Precautions Precautions: Other (comment);Fall Precaution Comments: watch sats Restrictions Weight Bearing Restrictions: No      Mobility Bed Mobility               General bed mobility comments: Pt sleeping in recliner while in hospital. She sleeps in lift chair at home.  Transfers Overall transfer level: Needs assistance Equipment used: None;Rolling walker (2 wheeled) Transfers: Sit to/from Stand Sit to Stand: Min guard         General transfer comment: increased time to stabilize balance    Balance Overall balance assessment: Needs assistance Sitting-balance support: No upper extremity supported;Feet supported Sitting balance-Leahy Scale: Good     Standing balance support: During functional activity;Single extremity supported Standing balance-Leahy Scale: Fair                              ADL either performed or assessed with clinical judgement   ADL Overall ADL's : Needs assistance/impaired Eating/Feeding: Set up;Sitting   Grooming: Set up;Sitting Grooming Details (indicate cue type and reason): minimal standing tolerance Upper Body Bathing: Set up;Sitting   Lower Body Bathing: Minimal assistance;Sit to/from stand;Sitting/lateral leans   Upper Body Dressing : Set up;Sitting   Lower Body Dressing: Minimal assistance;Sit to/from stand;Sitting/lateral leans   Toilet Transfer: Min guard;Minimal Systems analyst Details (indicate cue type and reason): decreased activity tolerance Toileting- Clothing Manipulation and Hygiene: Minimal assistance;Sitting/lateral lean;Sit to/from stand       Functional mobility during ADLs: Min guard;Rolling walker       Vision Patient Visual Report: No change from baseline       Perception     Praxis      Pertinent Vitals/Pain Pain Assessment: Faces Faces Pain Scale: Hurts little more Pain Location: R distal LE Pain Descriptors / Indicators: Sore Pain Intervention(s): Monitored during session     Hand Dominance Right   Extremity/Trunk Assessment Upper Extremity Assessment Upper Extremity Assessment: Generalized weakness   Lower Extremity Assessment Lower Extremity Assessment: Generalized weakness   Cervical / Trunk Assessment Cervical / Trunk Assessment: Normal   Communication Communication Communication: No difficulties   Cognition Arousal/Alertness: Lethargic;Suspect due to medications Behavior During Therapy: Flat affect Overall Cognitive Status: Within Functional Limits for tasks assessed  General Comments: Pt reports she just took medicine and it is making her sleepy, minimally engaged   General Comments  Pt on 3L O2 via [place].    Exercises     Shoulder Instructions      Home Living Family/patient expects to be  discharged to:: Private residence Living Arrangements: Children Available Help at Discharge: Family;Available 24 hours/day Type of Home: House Home Access: Level entry     Home Layout: One level     Bathroom Shower/Tub: Teacher, early years/pre: Handicapped height     Home Equipment: Tub bench;Hospital bed;Walker - 4 wheels;Cane - single point;Other (comment);Bedside commode   Additional Comments: per chart HHaide for IADL assist      Prior Functioning/Environment Level of Independence: Independent with assistive device(s)        Comments: rollator for mobility, walks only to/from bathroom. Sleeps in lift chair. Per pt, active with HH on admission.        OT Problem List: Decreased strength;Decreased knowledge of use of DME or AE;Decreased activity tolerance;Cardiopulmonary status limiting activity;Impaired balance (sitting and/or standing)      OT Treatment/Interventions: Self-care/ADL training;Patient/family education;Therapeutic exercise;Balance training;Energy conservation;Therapeutic activities;DME and/or AE instruction    OT Goals(Current goals can be found in the care plan section) Acute Rehab OT Goals Patient Stated Goal: not stated OT Goal Formulation: With patient Time For Goal Achievement: 07/11/19 Potential to Achieve Goals: Good  OT Frequency: Min 2X/week   Barriers to D/C:            Co-evaluation              AM-PAC OT "6 Clicks" Daily Activity     Outcome Measure Help from another person eating meals?: None Help from another person taking care of personal grooming?: None Help from another person toileting, which includes using toliet, bedpan, or urinal?: A Little Help from another person bathing (including washing, rinsing, drying)?: A Little Help from another person to put on and taking off regular upper body clothing?: None Help from another person to put on and taking off regular lower body clothing?: A Little 6 Click Score:  21   End of Session Nurse Communication: Mobility status  Activity Tolerance: Patient tolerated treatment well Patient left: in chair  OT Visit Diagnosis: Other abnormalities of gait and mobility (R26.89);Muscle weakness (generalized) (M62.81)                Time: 4403-4742 OT Time Calculation (min): 9 min Charges:  OT General Charges $OT Visit: 1 Visit OT Evaluation $OT Eval Moderate Complexity: [place], MSOT, OTR/L [place] [facility] Office Number: [phone] Pager: [phone]  Zenovia Jarred 06/27/2019, 12:49 PM

## 2019-06-27 NOTE — Progress Notes (Signed)
Orthopedic Tech Progress Note Patient Details:  Christine Sims 16-Aug-1955 [id number]  Ortho Devices Type of Ortho Device: Haematologist Ortho Device/Splint Location: (B) LE Ortho Device/Splint Interventions: Ordered, Application   Post Interventions Patient Tolerated: Well Instructions Provided: Care of device   Braulio Bosch 06/27/2019, 11:48 AM

## 2019-06-27 NOTE — TOC Initial Note (Addendum)
Transition of Care [facility]) - Initial/Assessment Note    Patient Details  Name: Christine Sims MRN: [id number] Date of Birth: 09-14-1955  [facility]) CM/SW Contact:    Zenon Mayo, RN Phone Number: 06/27/2019, 3:16 PM  Clinical Narrative:                 NCM spoke with patient, she states she and daughter live together, she is on 3 liters oxygen with [employer] and she has a rollator.  Her daughter works.  She at first stated she wanted to go to snf facility to get short term rehab, then asked if she would have to pay for it, NCM informed her that her check will have to go to the facility,then patient states oh no, that's ok I will go home with [facility] services.  NCM offered choice, she does not have a preference, NCM made referral to [facility] with Brittany,she is able to take referral for HHRN,HHPT, [place].  Soc will begin 24 to 48 hrs post dc.  Patient has una boots on with open sore on right shin, and she needs med management.   Expected Discharge Plan: Skilled Nursing Facility Barriers to Discharge: Continued Medical Work up   Patient Goals and CMS Choice Patient states their goals for this hospitalization and ongoing recovery are:: go home with [facility] CMS Medicare.gov Compare Post Acute Care list provided to:: Patient Choice offered to / list presented to : Patient  Expected Discharge Plan and Services Expected Discharge Plan: [place]   Discharge Planning Services: CM Consult Post Acute Care Choice: [place] Common arrangements for the past 2 months: Single Family Home                   DME Agency: NA       HH Arranged: RN, PT, OT, Disease Management [place] Agency: [facility] Date [place]: 06/27/19 Time [place]: 31 Representative spoke with at [place]: [place] Arrangements/Services Living arrangements for the past 2 months: [place] with:: Adult Children Patient language and  need for interpreter reviewed:: Yes Do you feel safe going back to the place where you live?: Yes      Need for Family Participation in Patient Care: Yes (Comment) Care giver support system in place?: Yes (comment) Current home services: DME(has home oxygen 3 liters with [employer], and rollator) Criminal Activity/Legal Involvement Pertinent to Current Situation/Hospitalization: No - Comment as needed  Activities of Daily Living Home Assistive Devices/Equipment: Cane (specify quad or straight), Walker (specify type) ADL Screening (condition at time of admission) Patient's cognitive ability adequate to safely complete daily activities?: Yes Is the patient deaf or have difficulty hearing?: No Does the patient have difficulty seeing, even when wearing glasses/contacts?: No Does the patient have difficulty concentrating, remembering, or making decisions?: No Patient able to express need for assistance with ADLs?: Yes Does the patient have difficulty dressing or bathing?: No Independently performs ADLs?: Yes (appropriate for developmental age) Does the patient have difficulty walking or climbing stairs?: Yes Weakness of Legs: Both Weakness of Arms/Hands: None  Permission Sought/Granted                  Emotional Assessment Appearance:: Appears stated age Attitude/Demeanor/Rapport: Engaged Affect (typically observed): Appropriate Orientation: : Oriented to Self, Oriented to Place, Oriented to  Time, Oriented to Situation Alcohol / Substance Use: Not Applicable Psych Involvement: No (comment)  Admission diagnosis:  Non-compliance [Z91.19] Acute  on chronic combined systolic and diastolic congestive heart failure (HCC) [I50.43] Medical non-compliance [Z91.19] Chronic obstructive pulmonary disease with acute exacerbation (HCC) [J44.1] Acute on chronic respiratory failure with hypoxia (HCC) [J96.21] Acute heart failure with reduced ejection fraction and diastolic dysfunction ([place])  [I50.41] Patient Active Problem List   Diagnosis Date Noted  . Acute on chronic HFrEF (heart failure with reduced ejection fraction) ([place]) 06/26/2019  . Acute on chronic respiratory failure ([place])   . Uncontrolled type 2 diabetes mellitus with hyperglycemia ([place]) 05/09/2019  . Morbid obesity ([place]) 05/09/2019  . Acute exacerbation of CHF (congestive heart failure) ([place]) 05/05/2019  . COPD with acute exacerbation ([place]) 02/25/2017  . Rhinovirus infection 02/25/2017  . HTN (hypertension) 02/25/2017  . Chronic combined systolic and diastolic heart failure ([place]) 02/25/2017  . CKD (chronic kidney disease) stage 3, GFR 30-59 ml/min 02/25/2017  . Diabetes mellitus, insulin dependent (IDDM), controlled 02/25/2017  . Depression 02/25/2017  . COPD (chronic obstructive pulmonary disease) ([place]) 02/18/2017  . Community acquired pneumonia 04/04/2015   PCP:  Kristopher Glee., MD Pharmacy:   [facility] [place] [address] [place] [address] [place] [address] Phone: [phone] Fax: [phone]     Social Determinants of Health (SDOH) Interventions    Readmission Risk Interventions Readmission Risk Prevention Plan 06/27/2019 05/13/2019  Transportation Screening Complete Complete  PCP or Specialist Appt within 3-5 Days - Complete  HRI or [place] Complete Complete  Social Work Consult for [place] Planning/Counseling Complete Complete  Palliative Care Screening Not Applicable Not Applicable  Medication Review Press photographer) Complete Complete  Some recent data might be hidden

## 2019-06-27 NOTE — Progress Notes (Signed)
Patient refusing tylenol as only pain treatment option at this time.    06/27/19 0730  Pain Assessment  Pain Scale 0-10  Pain Score 9

## 2019-06-27 NOTE — Progress Notes (Signed)
Subjective:  Christine Sims was examined and evaluated at bedside this am. She states that she urinated large volume overnight but still feels dyspneic. She mentions receiving her duoneb treatments and expresses understanding that her symptoms may take some time to resolve.  Objective:    Vital Signs (last 24 hours): Vitals:   06/26/19 2015 06/26/19 2338 06/27/19 0414 06/27/19 0415  BP: (!) 142/73 130/73 (!) 125/97   Pulse: 86 81 77   Resp: '16 20 16   '$ Temp: 98.4 F (36.9 C) 98.4 F (36.9 C) 98.2 F (36.8 C)   TempSrc: Oral Oral Oral   SpO2: 100% 100% 100%   Weight:    117.8 kg  Height:       Physical Exam: General Alert and answers questions appropriately, no acute distress  Cardiac Regular rate and rhythm, no murmurs, rubs, or gallops  Pulmonary Distant breath sounds, poor respiratory effort, on 3 L/min  Extremities 2+ pitting edema up to knees bilaterally   BMP Latest Ref Rng & Units 06/27/2019 06/26/2019 06/26/2019  Glucose 70 - 99 mg/dL 239(H) - 191(H)  BUN 8 - 23 mg/dL 26(H) - 24(H)  Creatinine 0.44 - 1.00 mg/dL 1.77(H) - 1.75(H)  Sodium 135 - 145 mmol/L 139 140 141  Potassium 3.5 - 5.1 mmol/L 4.0 3.7 3.8  Chloride 98 - 111 mmol/L 104 - 106  CO2 22 - 32 mmol/L 24 - 27  Calcium 8.9 - 10.3 mg/dL 8.9 - 9.1   CBC Latest Ref Rng & Units 06/27/2019 06/26/2019 06/26/2019  WBC 4.0 - 10.5 K/uL 5.3 - -  Hemoglobin 12.0 - 15.0 g/dL 9.0(L) 8.6(L) 9.2(L)  Hematocrit 36.0 - 46.0 % 30.0(L) 29.6(L) 27.0(L)  Platelets 150 - 400 K/uL 215 - -   Mag: 1.9   Assessment/Plan:   Principal Problem:   Acute on chronic HFrEF (heart failure with reduced ejection fraction) [facility])  Patient is a 64 year old female with past medical history significant for HFrEF, CAD status post stents, COPD, CKD4, asthma, hypertension, and depression who presented on 06/26/2019 with a 4-day history of worsening shortness of breath.  # Acute on chronic systolic heart failure TTE from two fourths 01/2020 reveals EF  30-35%.  Patient estimates her self 20 pounds above dry weight.  Likely secondary to dietary indiscretion and missed medication doses. *Net 1 L out since admission - first lasix dosage yesterday at 2 pm. Mag of 1.9, K of 1.9 *Continue Lasix 40 mg 3 times daily *Strict I/Os, daily weights *Daily BMP, goal K>4.0, Mag>2.0 *Monitor renal function closely during diuresis  # COPD Patient with worsening shortness of breath over past 4 days, appears mostly secondary to volume overload.  Home regimen of Symbicort 160-4.5 two puffs twice daily+albuterol PRN. Bilateral wheezing on exam. No cough or increased sputum production. No indication for antibiotic therapy *Prednisone 40 mg daily for 5 day course *Dulera 200-5 2 puff twice daily + Duonebs PRN  # CKD stage 3B-4: Baseline creatinine 2.1 (05/14/19), eGFR 25-36. Creatinine stable at 1.77 from 1.75 on admission. May improve with diuresis *Continue to monitor  # Diabetes mellitus: Home regimen of Glargine 60 units twice daily + Aspart 30 units three times daily with meals. CBG elevated since admission *Resume home dosing of insulin: 60 units twice daily + Aspart 30 units three times daily + SSI *Continue gabapentin 300 mg at bedtime for neuropathy  # CAD s/p stents: Continue home Plavix 75 mg daily  # HTN: Home BP regimen of amlodipine 10 mg daily + metoprolol (toprol  XL)100 mg daily + hydralazine 25 mg every 8 hours + doxazosin 2 mg daily.  *Restart amlodipine 10 mg daily  # Depression/Anxiety: Continue home buspirone 15 mg twice daily + amitriptyline 25 mg at bedtime + trazodone 20 mg at bedtime as needed for sleep  # GERD: Continue home pantoprazole 20 mg daily PT/OT: Consulted, eval pending Diet: Heart healthy/carb modified DVT Ppx: Lovenox 40 mg daily Admit Status: Inpatient Dispo: Anticipated discharge in approximately 4-5 days  Christine Hubert, MD 06/27/2019, 6:35 AM

## 2019-06-28 DIAGNOSIS — E1143 Type 2 diabetes mellitus with diabetic autonomic (poly)neuropathy: Secondary | ICD-10-CM

## 2019-06-28 DIAGNOSIS — I255 Ischemic cardiomyopathy: Secondary | ICD-10-CM

## 2019-06-28 DIAGNOSIS — I130 Hypertensive heart and chronic kidney disease with heart failure and stage 1 through stage 4 chronic kidney disease, or unspecified chronic kidney disease: Principal | ICD-10-CM

## 2019-06-28 DIAGNOSIS — J9621 Acute and chronic respiratory failure with hypoxia: Secondary | ICD-10-CM

## 2019-06-28 DIAGNOSIS — E1129 Type 2 diabetes mellitus with other diabetic kidney complication: Secondary | ICD-10-CM

## 2019-06-28 DIAGNOSIS — J441 Chronic obstructive pulmonary disease with (acute) exacerbation: Secondary | ICD-10-CM

## 2019-06-28 DIAGNOSIS — N184 Chronic kidney disease, stage 4 (severe): Secondary | ICD-10-CM

## 2019-06-28 DIAGNOSIS — Z794 Long term (current) use of insulin: Secondary | ICD-10-CM

## 2019-06-28 DIAGNOSIS — Z955 Presence of coronary angioplasty implant and graft: Secondary | ICD-10-CM

## 2019-06-28 LAB — GLUCOSE, CAPILLARY
Glucose-Capillary: 326 mg/dL — ABNORMAL HIGH (ref 70–99)
Glucose-Capillary: 175 mg/dL — ABNORMAL HIGH (ref 70–99)
Glucose-Capillary: 364 mg/dL — ABNORMAL HIGH (ref 70–99)
Glucose-Capillary: 331 mg/dL — ABNORMAL HIGH (ref 70–99)
Glucose-Capillary: 458 mg/dL — ABNORMAL HIGH (ref 70–99)

## 2019-06-28 LAB — BASIC METABOLIC PANEL
Anion gap: 10 (ref 5–15)
BUN: 32 mg/dL — ABNORMAL HIGH (ref 8–23)
CO2: 27 mmol/L (ref 22–32)
Calcium: 9.3 mg/dL (ref 8.9–10.3)
Chloride: 102 mmol/L (ref 98–111)
Creatinine, Ser: 1.82 mg/dL — ABNORMAL HIGH (ref 0.44–1.00)
GFR calc Af Amer: 34 mL/min — ABNORMAL LOW (ref >60)
GFR calc non Af Amer: 29 mL/min — ABNORMAL LOW (ref >60)
Glucose, Bld: 241 mg/dL — ABNORMAL HIGH (ref 70–99)
Potassium: 4.2 mmol/L (ref 3.5–5.1)
Sodium: 139 mmol/L (ref 135–145)

## 2019-06-28 LAB — MAGNESIUM: Magnesium: 2.3 mg/dL (ref 1.7–2.4)

## 2019-06-28 LAB — CBC
HCT: 30.4 % — ABNORMAL LOW (ref 36.0–46.0)
Hemoglobin: 9 g/dL — ABNORMAL LOW (ref 12.0–15.0)
MCH: 25.6 pg — ABNORMAL LOW (ref 26.0–34.0)
MCHC: 29.6 g/dL — ABNORMAL LOW (ref 30.0–36.0)
MCV: 86.6 fL (ref 80.0–100.0)
Platelets: 251 10*3/uL (ref 150–400)
RBC: 3.51 MIL/uL — ABNORMAL LOW (ref 3.87–5.11)
RDW: 16.4 % — ABNORMAL HIGH (ref 11.5–15.5)
WBC: 7.1 10*3/uL (ref 4.0–10.5)
nRBC: 0 % (ref 0.0–0.2)

## 2019-06-28 MED ORDER — BACLOFEN 5 MG HALF TABLET
5.0000 mg | ORAL_TABLET | Freq: Once | ORAL | Status: AC
  Administered 2019-06-28: 17:00:00 5 mg via ORAL
  Filled 2019-06-28 (×2): qty 1

## 2019-06-28 MED ORDER — FUROSEMIDE 10 MG/ML IJ SOLN
40.0000 mg | Freq: Three times a day (TID) | INTRAMUSCULAR | Status: DC
  Administered 2019-06-28 – 2019-06-29 (×2): 40 mg via INTRAVENOUS
  Filled 2019-06-28 (×3): qty 4

## 2019-06-28 MED ORDER — ALUM & MAG HYDROXIDE-SIMETH 200-200-20 MG/5ML PO SUSP
30.0000 mL | Freq: Once | ORAL | Status: AC
  Administered 2019-06-28: 30 mL via ORAL
  Filled 2019-06-28: qty 30

## 2019-06-28 MED ORDER — IPRATROPIUM-ALBUTEROL 0.5-2.5 (3) MG/3ML IN SOLN
3.0000 mL | Freq: Three times a day (TID) | RESPIRATORY_TRACT | Status: DC
  Administered 2019-06-28 – 2019-06-29 (×2): 3 mL via RESPIRATORY_TRACT
  Filled 2019-06-28 (×3): qty 3

## 2019-06-28 MED ORDER — INSULIN ASPART 100 UNIT/ML ~~LOC~~ SOLN
0.0000 [IU] | Freq: Three times a day (TID) | SUBCUTANEOUS | Status: DC
  Administered 2019-06-29: 07:00:00 15 [IU] via SUBCUTANEOUS
  Administered 2019-06-29: 12:00:00 7 [IU] via SUBCUTANEOUS
  Administered 2019-06-29: 17:00:00 4 [IU] via SUBCUTANEOUS
  Administered 2019-06-30 (×3): 3 [IU] via SUBCUTANEOUS
  Administered 2019-07-01: 12:00:00 7 [IU] via SUBCUTANEOUS
  Administered 2019-07-02: 4 [IU] via SUBCUTANEOUS
  Administered 2019-07-02: 3 [IU] via SUBCUTANEOUS
  Administered 2019-07-02 – 2019-07-04 (×3): 7 [IU] via SUBCUTANEOUS

## 2019-06-28 MED ORDER — INSULIN GLARGINE 100 UNIT/ML ~~LOC~~ SOLN
65.0000 [IU] | Freq: Two times a day (BID) | SUBCUTANEOUS | Status: DC
  Administered 2019-06-28 – 2019-07-01 (×7): 65 [IU] via SUBCUTANEOUS
  Filled 2019-06-28 (×9): qty 0.65

## 2019-06-28 MED ORDER — INSULIN ASPART 100 UNIT/ML ~~LOC~~ SOLN
40.0000 [IU] | Freq: Three times a day (TID) | SUBCUTANEOUS | Status: DC
  Administered 2019-06-28: 40 [IU] via SUBCUTANEOUS
  Administered 2019-06-29: 18:00:00 30 [IU] via SUBCUTANEOUS
  Administered 2019-06-29 (×2): 40 [IU] via SUBCUTANEOUS

## 2019-06-28 MED ORDER — INSULIN ASPART 100 UNIT/ML ~~LOC~~ SOLN
15.0000 [IU] | Freq: Once | SUBCUTANEOUS | Status: AC
  Administered 2019-06-28: 22:00:00 15 [IU] via SUBCUTANEOUS

## 2019-06-28 MED ORDER — INSULIN ASPART 100 UNIT/ML ~~LOC~~ SOLN
0.0000 [IU] | Freq: Every day | SUBCUTANEOUS | Status: DC
  Administered 2019-06-28: 5 [IU] via SUBCUTANEOUS
  Administered 2019-07-01: 22:00:00 2 [IU] via SUBCUTANEOUS

## 2019-06-28 MED ORDER — GABAPENTIN 600 MG PO TABS
300.0000 mg | ORAL_TABLET | Freq: Two times a day (BID) | ORAL | Status: DC
  Administered 2019-06-28: 14:00:00 300 mg via ORAL
  Filled 2019-06-28: qty 1

## 2019-06-28 MED ORDER — FUROSEMIDE 10 MG/ML IJ SOLN: 60.0000 mg | Freq: Three times a day (TID) | INTRAMUSCULAR | Status: DC

## 2019-06-28 MED ORDER — GABAPENTIN 600 MG PO TABS
600.0000 mg | ORAL_TABLET | Freq: Every day | ORAL | Status: DC
  Administered 2019-06-28 – 2019-07-03 (×6): 600 mg via ORAL
  Filled 2019-06-28 (×6): qty 1

## 2019-06-28 NOTE — Progress Notes (Signed)
Patient requests something besides tylenol for pain. MD aware of request. Patient declines tylenol.  Pt requested changing scheduled lasix timing due to reluctance to take treatment close to bed time. Pharmacy adjusted the schedule accordingly.  MD aware of ongoing hyperglycemia - will reevaluate treatment in the morning.

## 2019-06-28 NOTE — Progress Notes (Signed)
Patient c/o leg pain MD notified MD order Neurontin, patient still c/o MD notified again, see epic for new order.

## 2019-06-28 NOTE — Progress Notes (Signed)
   Subjective:  Christine Sims is a 64 y.o. with PMH of HFrEF, CAD s/p PCI, COPD, CKD4, Asthma, HTN and Depression admit for acute on chronic heart failure on hospital day 2  She was examined and evaluated at bedside this am. She mentions feeling sleepy and states she refused CPAP overnight because it was uncomfortable. She mentions that her leg pain has bothered her overnight and requests additional pain control. She describes pain as burning and sharp sensation that radiates up her leg. She also mentions occasionally her toes feel numb. She denies any fevers, chills, nausea, vomiting  Objective:  Vital signs in last 24 hours: Vitals:   06/28/19 0502 06/28/19 0507 06/28/19 0802 06/28/19 1110  BP: (!) 141/82   132/61  Pulse: 84   85  Resp: 18   20  Temp: 98 F (36.7 C)   98.1 F (36.7 C)  TempSrc: Oral   Oral  SpO2: 100%  95% 100%  Weight:  118.4 kg    Height:       Gen: Well-developed, obese, NAD HEENT: NCAT head, hearing intact, EOMI, MMM CV: RRR, S1, S2 normal, No rubs, no murmurs, no gallops Pulm: Distant breath sounds, basilar rales Abd: Soft, BS+, NTND, No rebound, no guarding Extm: ROM intact, Bilateral lower extremities with compression warps Skin: Dry, Warm, normal turgor, no rashes, lesions, wounds.  Neuro: AAOx3  Assessment/Plan:  Principal Problem:   Acute on chronic HFrEF (heart failure with reduced ejection fraction) (HCC)  Christine Sims is a 64 y.o. with PMH of HFrEF, CAD s/p PCI, COPD, CKD4, Asthma, HTN and Depression admit for acute on chronic heart failure   Acute on chronic systolic heart failure Ischemic Cardiomyopathy CAD Most recent TTE: EF 30-35%, Dry weight~105-110kg. Current weight 118.4kg. Went into heart failure 2/2 dietary indiscretion and medication non-adherence. 1.5L urine output noted yesterday but appear missed documentation of evening diuresis. Appear to have significant PO fluid intake. Much improved diuresis this am. Mag 2.3,  Creatinine 1.82. K 4.2 - C/w IV Furosemide 40mg  TID - Trend BMP - Mag level - Strict I&Os - Daily Weights - Keep O2 sat >88 - Replenish K as needed >4.0 - C/w home meds: clopidogrel 75mg  daily, isosorbide mononitrate 30mg  daily, atorvastatin 80mg  daily  COPD exacerbation Acute on chronic hypoxic resp failure Presented w/ worsening shortness of breath with wheezing with 5L [place] oxygen requirement. On symbicort 160-4.5, albuterol. Wheezing improving from presentation. Oxygen requirement down to 3L (her home O2) On day 3 of prednisone. - C/w prednisone 40mg  daily (day 3/5) - Dulera 200-5 2 puff BID - Duonebs  T2DM Elevated blood sugar yesterday due to concurrent prednisone therapy. Insulin titrated up to higher dose. Overnight another episode of hyperglycemia with cbg of 326. - Increase Novolog to 40 units TID - Increase Lantus to 65 units BID - SSI  Peripheral neuropathy Complaining of lower extremity pain. Initially thought to be due to venous stasis ulcer but description sounds more neuropathic - Increase gabapentin to 300mg  BID  DVT prophx: Lovenox Diet: HH Bowel: N/A Code: Full  Dispo: Anticipated discharge in approximately 1-2 day(s).   Mosetta Anis, MD 06/28/2019, 1:30 PM Pager: [phone]

## 2019-06-28 NOTE — Progress Notes (Signed)
Pt is refusing to use a cpap at night. She said she is claustrophobic and will not put anything over her face.

## 2019-06-29 DIAGNOSIS — I5023 Acute on chronic systolic (congestive) heart failure: Secondary | ICD-10-CM

## 2019-06-29 LAB — GLUCOSE, CAPILLARY
Glucose-Capillary: 341 mg/dL — ABNORMAL HIGH (ref 70–99)
Glucose-Capillary: 236 mg/dL — ABNORMAL HIGH (ref 70–99)
Glucose-Capillary: 171 mg/dL — ABNORMAL HIGH (ref 70–99)
Glucose-Capillary: 174 mg/dL — ABNORMAL HIGH (ref 70–99)

## 2019-06-29 LAB — TROPONIN I (HIGH SENSITIVITY)
Troponin I (High Sensitivity): 125 ng/L (ref <18)
Troponin I (High Sensitivity): 424 ng/L (ref <18)
Troponin I (High Sensitivity): 720 ng/L (ref <18)
Troponin I (High Sensitivity): 811 ng/L (ref <18)

## 2019-06-29 LAB — BASIC METABOLIC PANEL
Anion gap: 9 (ref 5–15)
BUN: 34 mg/dL — ABNORMAL HIGH (ref 8–23)
CO2: 29 mmol/L (ref 22–32)
Calcium: 9.1 mg/dL (ref 8.9–10.3)
Chloride: 98 mmol/L (ref 98–111)
Creatinine, Ser: 1.71 mg/dL — ABNORMAL HIGH (ref 0.44–1.00)
GFR calc Af Amer: 37 mL/min — ABNORMAL LOW (ref >60)
GFR calc non Af Amer: 32 mL/min — ABNORMAL LOW (ref >60)
Glucose, Bld: 455 mg/dL — ABNORMAL HIGH (ref 70–99)
Potassium: 4.3 mmol/L (ref 3.5–5.1)
Sodium: 136 mmol/L (ref 135–145)

## 2019-06-29 LAB — CBC
HCT: 28 % — ABNORMAL LOW (ref 36.0–46.0)
Hemoglobin: 8.4 g/dL — ABNORMAL LOW (ref 12.0–15.0)
MCH: 25.5 pg — ABNORMAL LOW (ref 26.0–34.0)
MCHC: 30 g/dL (ref 30.0–36.0)
MCV: 84.8 fL (ref 80.0–100.0)
Platelets: 242 10*3/uL (ref 150–400)
RBC: 3.3 MIL/uL — ABNORMAL LOW (ref 3.87–5.11)
RDW: 16.3 % — ABNORMAL HIGH (ref 11.5–15.5)
WBC: 7.3 10*3/uL (ref 4.0–10.5)
nRBC: 0 % (ref 0.0–0.2)

## 2019-06-29 LAB — HEPARIN LEVEL (UNFRACTIONATED)
Heparin Unfractionated: 0.13 IU/mL — ABNORMAL LOW (ref 0.30–0.70)
Heparin Unfractionated: 0.24 IU/mL — ABNORMAL LOW (ref 0.30–0.70)

## 2019-06-29 LAB — MAGNESIUM: Magnesium: 2 mg/dL (ref 1.7–2.4)

## 2019-06-29 MED ORDER — HEPARIN (PORCINE) 25000 UT/250ML-% IV SOLN
1400.0000 [IU]/h | INTRAVENOUS | Status: AC
  Administered 2019-06-29: 09:00:00 1000 [IU]/h via INTRAVENOUS
  Administered 2019-06-30 (×2): 1250 [IU]/h via INTRAVENOUS
  Administered 2019-07-01 (×2): 1400 [IU]/h via INTRAVENOUS
  Filled 2019-06-29 (×5): qty 250

## 2019-06-29 MED ORDER — NITROGLYCERIN 0.4 MG SL SUBL: 0.4000 mg | SUBLINGUAL_TABLET | SUBLINGUAL | Status: DC | PRN

## 2019-06-29 MED ORDER — METOLAZONE 2.5 MG PO TABS
2.5000 mg | ORAL_TABLET | Freq: Once | ORAL | Status: AC
  Administered 2019-06-29: 2.5 mg via ORAL
  Filled 2019-06-29: qty 1

## 2019-06-29 MED ORDER — OXYCODONE HCL 5 MG PO TABS
5.0000 mg | ORAL_TABLET | Freq: Four times a day (QID) | ORAL | Status: DC | PRN
  Administered 2019-06-29 – 2019-07-04 (×16): 5 mg via ORAL
  Filled 2019-06-29 (×16): qty 1

## 2019-06-29 MED ORDER — FUROSEMIDE 10 MG/ML IJ SOLN
60.0000 mg | Freq: Three times a day (TID) | INTRAMUSCULAR | Status: DC
  Administered 2019-06-29 – 2019-07-02 (×8): 60 mg via INTRAVENOUS
  Filled 2019-06-29 (×8): qty 6

## 2019-06-29 MED ORDER — NITROGLYCERIN 0.4 MG SL SUBL
SUBLINGUAL_TABLET | SUBLINGUAL | Status: AC
  Administered 2019-06-29: 0.4 mg
  Filled 2019-06-29: qty 1

## 2019-06-29 MED ORDER — IPRATROPIUM-ALBUTEROL 0.5-2.5 (3) MG/3ML IN SOLN
3.0000 mL | Freq: Four times a day (QID) | RESPIRATORY_TRACT | Status: DC | PRN
  Administered 2019-06-29 – 2019-07-03 (×5): 3 mL via RESPIRATORY_TRACT
  Filled 2019-06-29 (×5): qty 3

## 2019-06-29 MED ORDER — HEPARIN BOLUS VIA INFUSION
4000.0000 [IU] | Freq: Once | INTRAVENOUS | Status: AC
  Administered 2019-06-29: 4000 [IU] via INTRAVENOUS
  Filled 2019-06-29: qty 4000

## 2019-06-29 NOTE — Progress Notes (Signed)
Patient complains of "buising and a knot" in love handle area, she states she may have had a bruise there, but she doesn't remember a knot until today after starting heparin drip. Patient was previously on Lovenox sub. RN notified provider and we will keep an eye on bruised area and monitor for s/s of bleeding.

## 2019-06-29 NOTE — Progress Notes (Signed)
CP 7/10- center and left, burning and heavy sensation "feels like when I had a Heart attack, but not as bad" BP 151/82 HR 104 Nitro x1 given  CP 5/10 BP 146/68 HR 105 Nitro x2  CP 3/10 BP 132/98 HR 104 3rd Nitro refused  Provider notified

## 2019-06-29 NOTE — Progress Notes (Signed)
Pt refused cpap tonight. 

## 2019-06-29 NOTE — Progress Notes (Signed)
[facility] for Heparin Indication: chest pain/ACS  Allergies  Allergen Reactions  . Keppra [Levetiracetam] Anaphylaxis       . Lisinopril Anaphylaxis     Patient Measurements: Height: 5\' 4"  (162.6 cm) Weight: 118.6 kg (261 lb 8 oz)(scale a) IBW/kg (Calculated) : 54.7 Heparin Dosing Weight: 83.2 kg  Vital Signs: Temp: 98.7 F (37.1 C) (04/25 1938) Temp Source: Oral (04/25 1938) BP: 103/83 (04/25 1938) Pulse Rate: 93 (04/25 1938)  Labs: Recent Labs    06/27/19 0402 06/27/19 0402 06/27/19 1639 06/28/19 0449 06/29/19 0234 06/29/19 0234 06/29/19 0544 06/29/19 1304 06/29/19 1858 06/29/19 2135  HGB 9.0*   < >  --  9.0* 8.4*  --   --   --   --   --   HCT 30.0*  --   --  30.4* 28.0*  --   --   --   --   --   PLT 215  --   --  251 242  --   --   --   --   --   HEPARINUNFRC  --   --   --   --   --   --   --  0.13*  --  0.24*  CREATININE 1.77*   < > 1.94* 1.82* 1.71*  --   --   --   --   --   TROPONINIHS  --   --   --   --  125*   < > 424* 720* 811*  --    < > = values in this interval not displayed.    Estimated Creatinine Clearance: 43.2 mL/min (A) (by C-G formula based on SCr of 1.71 mg/dL (H)).   Medical History: Past Medical History:  Diagnosis Date  . Arthritis   . Asthma   . CHF (congestive heart failure) ([place])   . CKD (chronic kidney disease) stage 3, GFR 30-59 ml/min 02/25/2017  . COPD (chronic obstructive pulmonary disease) ([place])   . Depression 02/25/2017  . Diabetes mellitus without complication ([place])   . HTN (hypertension) 02/25/2017  . Hypercholesterolemia   . Hypertension   . MI (myocardial infarction) ([place])   . Shortness of breath dyspnea   . Stroke [facility])    R sided weakness    Assessment: 64 yo F with a hx of CAD s/p prior PCI now with new onset CP relieved by nitroglycerin during admission. Pharmacy asked to dose heparin. No AC noted PTA. Last dose of enoxaparin for DVT PPX on 4/24 AM. Hgb low 8.4, plts  wnl, no overt bleeding noted.    Heparin infusion rate 1100 uts/hr HL 0.23 < goal. No bleeding noted  Goal of Therapy:  Heparin level 0.3-0.7 units/ml Monitor platelets by anticoagulation protocol: Yes   Plan:  Increase heparin infusion to 1250 units/hr Monitor daily HL, CBC, s/sx bleeding   Bonnita Nasuti Pharm.D. CPP, BCPS Clinical Pharmacist [phone] 06/29/2019 10:49 PM    Please check [email or web address] for unit-specific pharmacy phone numbers.

## 2019-06-29 NOTE — Progress Notes (Signed)
[place] for Heparin Indication: chest pain/ACS  Allergies  Allergen Reactions  . Keppra [Levetiracetam] Anaphylaxis       . Lisinopril Anaphylaxis     Patient Measurements: Height: 5\' 4"  (162.6 cm) Weight: 118.6 kg (261 lb 8 oz)(scale a) IBW/kg (Calculated) : 54.7 Heparin Dosing Weight: 83.2 kg  Vital Signs: Temp: 98.3 F (36.8 C) (04/25 0528) Temp Source: Oral (04/25 0528) BP: 152/90 (04/25 0528) Pulse Rate: 94 (04/25 0528)  Labs: Recent Labs    06/26/19 1251 06/26/19 1447 06/27/19 0402 06/27/19 0402 06/27/19 1639 06/28/19 0449 06/29/19 0234 06/29/19 0544  HGB   < >  --  9.0*   < >  --  9.0* 8.4*  --   HCT   < >  --  30.0*  --   --  30.4* 28.0*  --   PLT   < >  --  215  --   --  251 242  --   CREATININE   < >  --  1.77*   < > 1.94* 1.82* 1.71*  --   TROPONINIHS  --  20*  --   --   --   --  125* 424*   < > = values in this interval not displayed.    Estimated Creatinine Clearance: 43.2 mL/min (A) (by C-G formula based on SCr of 1.71 mg/dL (H)).   Medical History: Past Medical History:  Diagnosis Date  . Arthritis   . Asthma   . CHF (congestive heart failure) ([place])   . CKD (chronic kidney disease) stage 3, GFR 30-59 ml/min 02/25/2017  . COPD (chronic obstructive pulmonary disease) ([place])   . Depression 02/25/2017  . Diabetes mellitus without complication ([place])   . HTN (hypertension) 02/25/2017  . Hypercholesterolemia   . Hypertension   . MI (myocardial infarction) ([place])   . Shortness of breath dyspnea   . Stroke [facility])    R sided weakness    Assessment: 64 yo F with a hx of CAD s/p prior PCI now with new onset CP relieved by nitroglycerin during admission. Pharmacy asked to dose heparin. No AC noted PTA. Last dose of enoxaparin for DVT PPX on 4/24 AM. Hgb low 8.4, plts wnl, no overt bleeding noted.  Goal of Therapy:  Heparin level 0.3-0.7 units/ml Monitor platelets by anticoagulation protocol: Yes   Plan:   Give heparin 4000 unit IV bolus Start heparin infusion at 1000 units/hr Check 6-hr HL Monitor daily HL, CBC, s/sx bleeding  Richardine Service, PharmD PGY1 Pharmacy Resident Phone: [phone] 06/29/2019  7:47 AM  Please check [email or web address] for unit-specific pharmacy phone numbers.

## 2019-06-29 NOTE — Progress Notes (Signed)
Troponin newly resulted- 61, cardiology notified of update.

## 2019-06-29 NOTE — Progress Notes (Signed)
Patient refused medications this morning (insulin subq and lasix IV) because she wanted ice water, RN explained fluid restriction multiple times last night and this morning. RN explained that breakfast will be coming soon and she will have drinks on her breakfast tray. Pt became upset and refused morning meds (will see if she will accept meds from day shift nurse) and patient also made false complaints that RN was not helping her all night and was being slow when that was not true.   Patient states only ice water will bring her CBG down so she will not take insulin, which has been in the 300-400s, until she gets water.  Please reiterate fluid restriction to patient, thanks.

## 2019-06-29 NOTE — Progress Notes (Addendum)
[place] for Heparin Indication: chest pain/ACS  Allergies  Allergen Reactions  . Keppra [Levetiracetam] Anaphylaxis       . Lisinopril Anaphylaxis     Patient Measurements: Height: 5\' 4"  (162.6 cm) Weight: 118.6 kg (261 lb 8 oz)(scale a) IBW/kg (Calculated) : 54.7 Heparin Dosing Weight: 83.2 kg  Vital Signs: Temp: 97.5 F (36.4 C) (04/25 1152) Temp Source: Oral (04/25 1152) BP: 133/74 (04/25 1152) Pulse Rate: 86 (04/25 1152)  Labs: Recent Labs    06/26/19 1447 06/27/19 0402 06/27/19 0402 06/27/19 1639 06/28/19 0449 06/29/19 0234 06/29/19 0544 06/29/19 1304  HGB  --  9.0*   < >  --  9.0* 8.4*  --   --   HCT  --  30.0*  --   --  30.4* 28.0*  --   --   PLT  --  215  --   --  251 242  --   --   HEPARINUNFRC  --   --   --   --   --   --   --  0.13*  CREATININE  --  1.77*   < > 1.94* 1.82* 1.71*  --   --   TROPONINIHS   < >  --   --   --   --  125* 424* 720*   < > = values in this interval not displayed.    Estimated Creatinine Clearance: 43.2 mL/min (A) (by C-G formula based on SCr of 1.71 mg/dL (H)).   Medical History: Past Medical History:  Diagnosis Date  . Arthritis   . Asthma   . CHF (congestive heart failure) ([place])   . CKD (chronic kidney disease) stage 3, GFR 30-59 ml/min 02/25/2017  . COPD (chronic obstructive pulmonary disease) ([place])   . Depression 02/25/2017  . Diabetes mellitus without complication ([place])   . HTN (hypertension) 02/25/2017  . Hypercholesterolemia   . Hypertension   . MI (myocardial infarction) ([place])   . Shortness of breath dyspnea   . Stroke [facility])    R sided weakness    Assessment: 64 yo F with a hx of CAD s/p prior PCI now with new onset CP relieved by nitroglycerin during admission. Pharmacy asked to dose heparin. No AC noted PTA. Last dose of enoxaparin for DVT PPX on 4/24 AM. Hgb low 8.4, plts wnl, no overt bleeding noted.  PM Update: Initial heparin level is subtherapeutic at 0.13  after giving 4000 unit bolus and starting drip rate at 1000 units/hr. Heparin infusion was started 1 hour late, and heparin level was drawn 1 hour early, so this level represents a 4-hr level and not a 6-hr heparin level. Will increase drip rate slightly anyway since she will likely not be therapeutic in 2 hours. Confirmed w/ phlebotomy heparin infusing in L arm, level drawn from R hand.  Goal of Therapy:  Heparin level 0.3-0.7 units/ml Monitor platelets by anticoagulation protocol: Yes   Plan:  Increase heparin infusion to 1100 units/hr Check 6-hr HL  Monitor daily HL, CBC, s/sx bleeding  Richardine Service, PharmD PGY1 Pharmacy Resident Phone: [phone] 06/29/2019  2:19 PM  Please check [email or web address] for unit-specific pharmacy phone numbers.

## 2019-06-29 NOTE — Progress Notes (Signed)
Troponin 125, provider notified.

## 2019-06-29 NOTE — Progress Notes (Addendum)
Progress Note  Patient Name: Christine Sims Date of Encounter: 06/29/2019  Primary Cardiologist: No primary care provider on file.   Subjective   Dyspnea worse. "My legs hurt so bad."  Inpatient Medications    Scheduled Meds: . amitriptyline  25 mg Oral QHS  . amLODipine  10 mg Oral Daily  . atorvastatin  80 mg Oral Daily  . busPIRone  15 mg Oral BID  . clopidogrel  75 mg Oral Daily  . furosemide  60 mg Intravenous TID  . gabapentin  600 mg Oral QHS  . insulin aspart  0-20 Units Subcutaneous TID WC  . insulin aspart  0-5 Units Subcutaneous QHS  . insulin aspart  40 Units Subcutaneous TID WC  . insulin glargine  65 Units Subcutaneous BID  . ipratropium-albuterol  3 mL Nebulization TID  . isosorbide mononitrate  30 mg Oral Daily  . metolazone  2.5 mg Oral Once  . mometasone-formoterol  2 puff Inhalation BID  . pantoprazole  20 mg Oral Daily  . sodium chloride flush  3 mL Intravenous Q12H  . spironolactone  12.5 mg Oral Daily   Continuous Infusions: . sodium chloride 250 mL (06/27/19 1021)  . heparin 1,000 Units/hr (06/29/19 0850)   PRN Meds: sodium chloride, acetaminophen, nitroGLYCERIN, ondansetron (ZOFRAN) IV, polyethylene glycol, sodium chloride flush, traZODone   Vital Signs    Vitals:   06/29/19 0528 06/29/19 0727 06/29/19 1029 06/29/19 1152  BP: (!) 152/90  (!) 146/81 133/74  Pulse: 94  93 86  Resp: 18  20 18   Temp: 98.3 F (36.8 C)  98.1 F (36.7 C) (!) 97.5 F (36.4 C)  TempSrc: Oral  Oral Oral  SpO2: 100% 96% 100% (!) 68%  Weight: 118.6 kg     Height:        Intake/Output Summary (Last 24 hours) at 06/29/2019 1224 Last data filed at 06/29/2019 0901 Gross per 24 hour  Intake 1762 ml  Output 2600 ml  Net -838 ml   Filed Weights   06/27/19 0415 06/28/19 0507 06/29/19 0528  Weight: 117.8 kg 118.4 kg 118.6 kg    Telemetry    nsr/st - Personally Reviewed  ECG    NSR with LVH - Personally Reviewed  Physical Exam   GEN: No acute  distress.   Neck: 7 cm JVD Cardiac: RRR, no murmurs, rubs, or gallops.  Respiratory: bilateral rales GI: Soft, nontender, non-distended  MS: No edema; No deformity. Neuro:  Nonfocal  Psych: Normal affect   Labs    Chemistry Recent Labs  Lab 06/26/19 1251 06/26/19 1306 06/27/19 1639 06/28/19 0449 06/29/19 0234  NA 141   < > 136 139 136  K 3.8   < > 4.5 4.2 4.3  CL 106   < > 102 102 98  CO2 27   < > 23 27 29   GLUCOSE 191*   < > 462* 241* 455*  BUN 24*   < > 30* 32* 34*  CREATININE 1.75*   < > 1.94* 1.82* 1.71*  CALCIUM 9.1   < > 8.9 9.3 9.1  PROT 6.6  --   --   --   --   ALBUMIN 2.8*  --   --   --   --   AST 10*  --   --   --   --   ALT 17  --   --   --   --   ALKPHOS 83  --   --   --   --  BILITOT 0.4  --   --   --   --   GFRNONAA 31*   < > 27* 29* 32*  GFRAA 36*   < > 31* 34* 37*  ANIONGAP 8   < > 11 10 9    < > = values in this interval not displayed.     Hematology Recent Labs  Lab 06/27/19 0402 06/28/19 0449 06/29/19 0234  WBC 5.3 7.1 7.3  RBC 3.52* 3.51* 3.30*  HGB 9.0* 9.0* 8.4*  HCT 30.0* 30.4* 28.0*  MCV 85.2 86.6 84.8  MCH 25.6* 25.6* 25.5*  MCHC 30.0 29.6* 30.0  RDW 16.6* 16.4* 16.3*  PLT 215 251 242    Cardiac EnzymesNo results for input(s): TROPONINI in the last 168 hours. No results for input(s): TROPIPOC in the last 168 hours.   BNP Recent Labs  Lab 06/26/19 1251  BNP 175.0*     DDimer No results for input(s): DDIMER in the last 168 hours.   Radiology    No results found.  Cardiac Studies   none  Patient Profile     64 y.o. female admitted with acute on chronic systolic heart failure   Assessment & Plan    1. Acute on chronic systolic heart failure - her volume remains up. Creatinine is a little better. She still has rales on exam. I have increased her lasix and ordered a single dose of metolazone for tomorrow. 2. Stage 2 renal insuff. - her creatinine is stable. We will follow. 3. CAD - she denies anginal symptoms. I  note from her primary team that she had some chest pain last night. She did not mention this to me. Her troponin has gone from 400 to 700. I would continue to check troponin. Her ECG from early this morning demonstrates ST depression. If the troponin continues to rise signficantly, I.e over 1000 or if she has symptoms then left heart cath will need to be considered.  4. Leg pain - she needs wound care to unwrap and rewrap her legs. Consider pain meds.    For questions or updates, please contact [place] Please consult [email or web address] for contact info under Cardiology/STEMI.   Signed, Cristopher Peru, MD  06/29/2019, 12:24 PM  Patient ID: Christine Sims, female   DOB: 1956-01-26, 64 y.o.   MRN: [id number]

## 2019-06-29 NOTE — Progress Notes (Signed)
Subjective:  Christine Sims is a 63 y.o. with PMH of HFrEF, CAD s/p PCI, COPD, CKD4, Asthma, HTN and Depression admit for acute on chronic heart failure on hospital day 3  Christine Sims was examined and evaluated at bedside this am. She states that overnight she had substernal chest pain that felt like pressure radiating toward left overnight. She mentions this feels similar to her previous heart attack. She states that onset was while at rest while watching TV and it was relieved by nitroglycerin. She reports some nausea associated with the chest pain. Discussed findings of overnight troponin and need to further work-up. Christine Sims expressed understanding.  Objective:  Vital signs in last 24 hours: Vitals:   06/29/19 0528 06/29/19 0727 06/29/19 1029 06/29/19 1152  BP: (!) 152/90  (!) 146/81 133/74  Pulse: 94  93 86  Resp: 18  20 18   Temp: 98.3 F (36.8 C)  98.1 F (36.7 C) (!) 97.5 F (36.4 C)  TempSrc: Oral  Oral Oral  SpO2: 100% 96% 100% (!) 68%  Weight: 118.6 kg     Height:       Gen: Well-developed, obese, NAD HEENT: NCAT head, hearing intact, EOMI, MMM CV: RRR, S1, S2 normal, No rubs, no murmurs, no gallops Pulm: Distant breath sounds, basilar rales Abd: Soft, BS+, NTND, No rebound, no guarding Extm: ROM intact, Bilateral lower extremities with compression warps Skin: Dry, Warm, normal turgor, no rashes, lesions, wounds.  Neuro: AAOx3  Physical Exam  Constitutional: She is well-developed, well-nourished, and in no distress. No distress.  HENT:  Mouth/Throat: Oropharynx is clear and moist.  Neck: JVD present.  Cardiovascular: Normal rate, regular rhythm, normal heart sounds and intact distal pulses.  No murmur heard. Pulmonary/Chest: Effort normal. She has rales (Bibasilar rales). She exhibits no tenderness.  Distant breath sounds  Abdominal: Soft. Bowel sounds are normal. She exhibits no distension. There is no abdominal tenderness.  Musculoskeletal:        General:  Normal range of motion.     Cervical back: Normal range of motion and neck supple.     Comments: Bilateral lower extremities with UNNA boots on   Assessment/Plan:  Principal Problem:   Acute on chronic HFrEF (heart failure with reduced ejection fraction) (HCC)  Christine Sims is a 64 y.o. with PMH of HFrEF, CAD s/p PCI, COPD, CKD4, Asthma, HTN and Depression admit for acute on chronic heart failure   Atypical Angina CAD s/p mid LAD stent, D1 PTCA Overnight experienced substernal chest pain relieved by nitroglycerin at rest. EKG showing ST depression on lead I, II. HsTrop rising 125->424. Last cath with [facility] on 10/2018 w/ LAD w/ diffuse non-critical calcific disease w/ patent stent, D1-prox w/ 95% stenosis and 50% distal stenosis of circumflex artery. - Start heparin gtt - Consult to cardiology - Telemetry - Repeat EKG - Nitroglycerin PRN - C/w isosorbide mononitrate 30mg  daily, clopidogrel 75mg  daily, atorvastatin 80mg  daily  Acute on chronic systolic heart failure Ischemic Cardiomyopathy Most recent TTE: EF 30-35%, Dry weight~105-110kg. Current weight 118.6kg. Went into heart failure 2/2 dietary indiscretion and medication non-adherence. 3.7L urine output noted yesterday. Net negative 3.4L since admission. Mag 2.0, Creatinine 1.71. K 4.3 - C/w IV Furosemide 40mg  TID - Trend BMP - Mag level - Strict I&Os - Daily Weights - Keep O2 sat >88 - Replenish K as needed >4.0  COPD exacerbation Acute on chronic hypoxic resp failure Presented w/ worsening shortness of breath with wheezing with 5L  oxygen requirement. On  symbicort 160-4.5, albuterol. Oxygen requirement down to 3L (her home O2) On day 4 of prednisone. Complicated by acute chest pain event and hyperglycemia. Also has intermittent refusal of insulin. Does not seem to be in acute COPD exacerbation any more. - Stop prednisone - Stop scheduled Duonebs - Dulera 200-5 2 puff BID  T2DM Elevated blood sugar since  admission due to concurrent prednisone therapy. Insulin titrated up to higher dose. Overnight episodes of hyperglycemia with cbg of 326. Should improve with cessation of prednisone therapy - C/w Novolog to 40 units TID - C/w Lantus to 65 units BID - SSI - Glucose checks  DVT prophx: Lovenox Diet: HH Bowel: N/A Code: Full  Dispo: Anticipated discharge in approximately 1-2 day(s).   Mosetta Anis, MD 06/29/2019, 12:33 PM Pager: [phone]

## 2019-06-29 NOTE — Plan of Care (Signed)
  Problem: Education: Goal: Knowledge of General Education information will improve Description Including pain rating scale, medication(s)/side effects and non-pharmacologic comfort measures Outcome: Progressing   Problem: Health Behavior/Discharge Planning: Goal: Ability to manage health-related needs will improve Outcome: Progressing   

## 2019-06-30 DIAGNOSIS — I10 Essential (primary) hypertension: Secondary | ICD-10-CM

## 2019-06-30 LAB — TROPONIN I (HIGH SENSITIVITY): Troponin I (High Sensitivity): 533 ng/L (ref <18)

## 2019-06-30 LAB — BASIC METABOLIC PANEL
Anion gap: 9 (ref 5–15)
BUN: 33 mg/dL — ABNORMAL HIGH (ref 8–23)
CO2: 31 mmol/L (ref 22–32)
Calcium: 9.2 mg/dL (ref 8.9–10.3)
Chloride: 100 mmol/L (ref 98–111)
Creatinine, Ser: 1.92 mg/dL — ABNORMAL HIGH (ref 0.44–1.00)
GFR calc Af Amer: 32 mL/min — ABNORMAL LOW (ref >60)
GFR calc non Af Amer: 27 mL/min — ABNORMAL LOW (ref >60)
Glucose, Bld: 157 mg/dL — ABNORMAL HIGH (ref 70–99)
Potassium: 4.1 mmol/L (ref 3.5–5.1)
Sodium: 140 mmol/L (ref 135–145)

## 2019-06-30 LAB — CBC
HCT: 31.5 % — ABNORMAL LOW (ref 36.0–46.0)
Hemoglobin: 9.4 g/dL — ABNORMAL LOW (ref 12.0–15.0)
MCH: 25.7 pg — ABNORMAL LOW (ref 26.0–34.0)
MCHC: 29.8 g/dL — ABNORMAL LOW (ref 30.0–36.0)
MCV: 86.1 fL (ref 80.0–100.0)
Platelets: 292 10*3/uL (ref 150–400)
RBC: 3.66 MIL/uL — ABNORMAL LOW (ref 3.87–5.11)
RDW: 16.5 % — ABNORMAL HIGH (ref 11.5–15.5)
WBC: 6.7 10*3/uL (ref 4.0–10.5)
nRBC: 0 % (ref 0.0–0.2)

## 2019-06-30 LAB — GLUCOSE, CAPILLARY
Glucose-Capillary: 145 mg/dL — ABNORMAL HIGH (ref 70–99)
Glucose-Capillary: 140 mg/dL — ABNORMAL HIGH (ref 70–99)
Glucose-Capillary: 142 mg/dL — ABNORMAL HIGH (ref 70–99)
Glucose-Capillary: 94 mg/dL (ref 70–99)

## 2019-06-30 LAB — MAGNESIUM: Magnesium: 2 mg/dL (ref 1.7–2.4)

## 2019-06-30 LAB — HEPARIN LEVEL (UNFRACTIONATED)
Heparin Unfractionated: <0.1 IU/mL — ABNORMAL LOW (ref 0.30–0.70)
Heparin Unfractionated: 0.24 IU/mL — ABNORMAL LOW (ref 0.30–0.70)

## 2019-06-30 MED ORDER — INSULIN ASPART 100 UNIT/ML ~~LOC~~ SOLN
30.0000 [IU] | Freq: Three times a day (TID) | SUBCUTANEOUS | Status: DC
  Administered 2019-06-30 – 2019-07-04 (×13): 30 [IU] via SUBCUTANEOUS

## 2019-06-30 NOTE — Plan of Care (Signed)
  Problem: Education: Goal: Knowledge of General Education information will improve Description: Including pain rating scale, medication(s)/side effects and non-pharmacologic comfort measures Outcome: Progressing   Problem: Health Behavior/Discharge Planning: Goal: Ability to manage health-related needs will improve Outcome: Progressing   Problem: Clinical Measurements: Goal: Ability to maintain clinical measurements within normal limits will improve Outcome: Progressing Goal: Will remain free from infection Outcome: Progressing Goal: Diagnostic test results will improve Outcome: Progressing Goal: Respiratory complications will improve Outcome: Progressing Goal: Cardiovascular complication will be avoided Outcome: Progressing   Problem: Activity: Goal: Risk for activity intolerance will decrease Outcome: Progressing   Problem: Nutrition: Goal: Adequate nutrition will be maintained Outcome: Progressing   Problem: Coping: Goal: Level of anxiety will decrease Outcome: Progressing   Problem: Elimination: Goal: Will not experience complications related to bowel motility Outcome: Progressing Goal: Will not experience complications related to urinary retention Outcome: Progressing   Problem: Pain Managment: Goal: General experience of comfort will improve Outcome: Progressing   Problem: Safety: Goal: Ability to remain free from injury will improve Outcome: Progressing   Problem: Skin Integrity: Goal: Risk for impaired skin integrity will decrease Outcome: Progressing   Problem: Education: Goal: Ability to demonstrate management of disease process will improve Outcome: Progressing Goal: Individualized Educational Video(s) Outcome: Progressing   Problem: Activity: Goal: Capacity to carry out activities will improve Outcome: Progressing   Problem: Cardiac: Goal: Ability to achieve and maintain adequate cardiopulmonary perfusion will improve Outcome: Progressing

## 2019-06-30 NOTE — Progress Notes (Signed)
Occupational Therapy Treatment Patient Details Name: Christine Sims MRN: [id number] DOB: Aug 14, 1955 Today's Date: 06/30/2019    History of present illness 64 yo F w/ PMH of COPD, HFrEF, CKD who presents with shortness of breath. Admitted for acute on chronic heart failure.   OT comments  Pt progressing. Extensive education provided for LB AE ADL. 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 Education and energy conservation handout provided. Pt stating 2 strategies that she plans to use at home. tranfserring to [facility] in room and able to stand for pericare ant/posterior care with supervisionA only. Pt very fatigued once seated back in recliner. Pt on 2.5 L O2 throughout session >90% with exertion. Continue per POC. OT following acutely.    Follow Up Recommendations  Home health OT;Supervision/Assistance - 24 hour    Equipment Recommendations  3 in 1 bedside commode    Recommendations for Other Services      Precautions / Restrictions Precautions Precautions: Fall;Other (comment) Precaution Comments: wears O2 Restrictions Weight Bearing Restrictions: No       Mobility Bed Mobility               General bed mobility comments: in recliner  Transfers Overall transfer level: Needs assistance Equipment used: None Transfers: Sit to/from Stand Sit to Stand: Supervision         General transfer comment: supervision for safety    Balance Overall balance assessment: Needs assistance   Sitting balance-Leahy Scale: Good     Standing balance support: During functional activity;No upper extremity supported Standing balance-Leahy Scale: Fair                             ADL either performed or assessed with clinical judgement   ADL Overall ADL's : Needs assistance/impaired                     Lower Body Dressing: Minimal assistance;Sit to/from stand;Sitting/lateral leans Lower Body Dressing Details (indicate cue type and reason): extensive education  provided for LB AE ADL. 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 pt may have access to a sock aid. pt demonstrated use of socks; difficulty due to coban covering B/L feet /unna boots present.  Toilet Transfer: Supervision/safety;Ambulation;BSC Toilet Transfer Details (indicate cue type and reason): Pt taking steps to [facility] from recliner. Assist for IV pole/wires; pt able to manage from [facility] to recliner Toileting- Clothing Manipulation and Hygiene: Supervision/safety;Sitting/lateral lean;Sit to/from stand;Cueing for safety Toileting - Clothing Manipulation Details (indicate cue type and reason): stood for pericare     Functional mobility during ADLs: Supervision/safety;Cueing for safety General ADL Comments: Education and energy conservation handout provided. Pt stating 2 strategies that she plans to use at home. Pt limited by pain in BLEs and decreased activity tolerance. Pt reports that she has family support to assist with things that she cannot do herself.     Vision       Perception     Praxis      Cognition Arousal/Alertness: Awake/alert Behavior During Therapy: WFL for tasks assessed/performed Overall Cognitive Status: Within Functional Limits for tasks assessed                                          Exercises     Shoulder Instructions       General Comments Pt on  2.5 L O2 throughout session >90% with exertion.    Pertinent Vitals/ Pain       Pain Assessment: No/denies pain  Home Living                                          Prior Functioning/Environment              Frequency  Min 2X/week        Progress Toward Goals  OT Goals(current goals can now be found in the care plan section)  Progress towards OT goals: Progressing toward goals  Acute Rehab OT Goals Patient Stated Goal: to not be so worn out OT Goal Formulation: With patient Time For Goal Achievement: 07/11/19 Potential to Achieve Goals: Good ADL  Goals Pt Will Perform Lower Body Bathing: with modified independence;sitting/lateral leans;sit to/from stand Pt Will Perform Lower Body Dressing: with modified independence;sitting/lateral leans;sit to/from stand Pt Will Perform Tub/Shower Transfer: with modified independence;ambulating;shower seat;rolling walker Additional ADL Goal #1: Pt will recall and apply 3-5 ECS strategies to BADL to improve independence  Plan Discharge plan remains appropriate    Co-evaluation                 AM-PAC OT "6 Clicks" Daily Activity     Outcome Measure   Help from another person eating meals?: None Help from another person taking care of personal grooming?: None Help from another person toileting, which includes using toliet, bedpan, or urinal?: A Little Help from another person bathing (including washing, rinsing, drying)?: A Little Help from another person to put on and taking off regular upper body clothing?: None Help from another person to put on and taking off regular lower body clothing?: A Little 6 Click Score: 21    End of Session Equipment Utilized During Treatment: Oxygen  OT Visit Diagnosis: Muscle weakness (generalized) (M62.81);Pain Pain - part of body: (b/l feet)   Activity Tolerance Patient tolerated treatment well   Patient Left in chair;with call bell/phone within reach   Nurse Communication Mobility status        Time: 1165-7903 OT Time Calculation (min): 25 min  Charges: OT General Charges $OT Visit: 1 Visit OT Treatments $Self Care/Home Management : 8-22 mins $Therapeutic Activity: 8-22 mins  Jefferey Pica, OTR/L Acute Rehabilitation Services Pager: [phone] Office: [phone]    Christine Sims 06/30/2019, 5:13 PM

## 2019-06-30 NOTE — Progress Notes (Signed)
Heparin drip paused D/T loss of IV access.

## 2019-06-30 NOTE — Progress Notes (Signed)
Progress Note  Patient Name: Christine Sims Date of Encounter: 06/30/2019  Primary Cardiologist: Loma Boston   Subjective   64 year old female who was admitted with acute on chronic systolic congestive heart failure. Obesity,  COPD, HTN depression  She ran out of her furosemide for several days and ate lots of fast food over the past week.  She gained approximately 20 pounds.  She has had a net diuresis of 1.5 L since admission.  She is getting IV Lasix 60 mg 3 times a day. She has gained volume over the past several days    Inpatient Medications    Scheduled Meds: . amitriptyline  25 mg Oral QHS  . amLODipine  10 mg Oral Daily  . atorvastatin  80 mg Oral Daily  . busPIRone  15 mg Oral BID  . clopidogrel  75 mg Oral Daily  . furosemide  60 mg Intravenous TID  . gabapentin  600 mg Oral QHS  . insulin aspart  0-20 Units Subcutaneous TID WC  . insulin aspart  0-5 Units Subcutaneous QHS  . insulin aspart  30 Units Subcutaneous TID WC  . insulin glargine  65 Units Subcutaneous BID  . isosorbide mononitrate  30 mg Oral Daily  . mometasone-formoterol  2 puff Inhalation BID  . pantoprazole  20 mg Oral Daily  . sodium chloride flush  3 mL Intravenous Q12H  . spironolactone  12.5 mg Oral Daily   Continuous Infusions: . sodium chloride Stopped (06/28/19 0913)  . heparin 1,250 Units/hr (06/30/19 1050)   PRN Meds: sodium chloride, acetaminophen, ipratropium-albuterol, nitroGLYCERIN, ondansetron (ZOFRAN) IV, oxyCODONE, polyethylene glycol, sodium chloride flush, traZODone   Vital Signs    Vitals:   06/30/19 0634 06/30/19 0850 06/30/19 1039 06/30/19 1148  BP: (!) 154/84  (!) 151/123 120/71  Pulse: 97  92 88  Resp: 20  18 18   Temp: 99.1 F (37.3 C)  98.3 F (36.8 C) 98.2 F (36.8 C)  TempSrc: Oral  Oral Oral  SpO2: 100% 98% 100% 100%  Weight: 118.2 kg     Height:        Intake/Output Summary (Last 24 hours) at 06/30/2019 1210 Last data filed at 06/30/2019  0900 Gross per 24 hour  Intake 3494.9 ml  Output 1250 ml  Net 2244.9 ml   Last 3 Weights 06/30/2019 06/29/2019 06/28/2019  Weight (lbs) 260 lb 8 oz 261 lb 8 oz 261 lb  Weight (kg) 118.162 kg 118.616 kg 118.389 kg      Telemetry    NSR  - Personally Reviewed  ECG     - Personally Reviewed  Physical Exam   GEN:  morbidly obese female,   Sitting in chair   Neck: No JVD Cardiac: RRR, no murmurs, rubs, or gallops.  Respiratory: Clear to auscultation bilaterally. GI:  obese  MS: legs are wrapped.  Neuro:  Nonfocal  Psych: Normal affect   Labs    High Sensitivity Troponin:   Recent Labs  Lab 06/29/19 0234 06/29/19 0544 06/29/19 1304 06/29/19 1858 06/30/19 0745  TROPONINIHS 125* 424* 720* 811* 533*      Chemistry Recent Labs  Lab 06/26/19 1251 06/26/19 1306 06/28/19 0449 06/29/19 0234 06/30/19 0745  NA 141   < > 139 136 140  K 3.8   < > 4.2 4.3 4.1  CL 106   < > 102 98 100  CO2 27   < > 27 29 31   GLUCOSE 191*   < > 241* 455* 157*  BUN  24*   < > 32* 34* 33*  CREATININE 1.75*   < > 1.82* 1.71* 1.92*  CALCIUM 9.1   < > 9.3 9.1 9.2  PROT 6.6  --   --   --   --   ALBUMIN 2.8*  --   --   --   --   AST 10*  --   --   --   --   ALT 17  --   --   --   --   ALKPHOS 83  --   --   --   --   BILITOT 0.4  --   --   --   --   GFRNONAA 31*   < > 29* 32* 27*  GFRAA 36*   < > 34* 37* 32*  ANIONGAP 8   < > 10 9 9    < > = values in this interval not displayed.     Hematology Recent Labs  Lab 06/28/19 0449 06/29/19 0234 06/30/19 0745  WBC 7.1 7.3 6.7  RBC 3.51* 3.30* 3.66*  HGB 9.0* 8.4* 9.4*  HCT 30.4* 28.0* 31.5*  MCV 86.6 84.8 86.1  MCH 25.6* 25.5* 25.7*  MCHC 29.6* 30.0 29.8*  RDW 16.4* 16.3* 16.5*  PLT 251 242 292    BNP Recent Labs  Lab 06/26/19 1251  BNP 175.0*     DDimer No results for input(s): DDIMER in the last 168 hours.   Radiology    No results found.  Cardiac Studies     Patient Profile     64 y.o. female with acute on chronic  combined CHF, obesity, HTN,   Assessment & Plan    1.  Acute on chronic combined systolic and diastolic congestive heart failure: The patient has gained volume over the past several days.  She is getting Lasix 60 mg 3 times a day.  I have cautioned her about drinking too much water. Continue current Lasix dosing.  We will ask her to stop drinking as much water.  There is verify that she has has been collecting all of her urine.  2.  Coronary artery disease: She has a history of stenting of the mid LAD.  She is not having any episodes of chest pain. Troponins peaked at 811 which is mildly elevated.  This is could be due to demand ischemia.  With her CKD I would hesitate to refer her to heart catheterization unless it became clear that this was due to an acute coronary syndrome.    3.  Chronic renal insufficiency: Creatinine this morning is 1.92.        For questions or upd4.  Morbid obesity: Encourage weight loss as an outpatient.ates, please contact [place] Please consult [email or web address] for contact info under        Signed, Mertie Moores, MD  06/30/2019, 12:10 PM

## 2019-06-30 NOTE — Plan of Care (Signed)

## 2019-06-30 NOTE — Progress Notes (Signed)
Subjective:  Christine Sims was examined and evaluated at bedside this am. She mentions that her breathing has gotten worse. She also mentions continuing intermittent chest pain. Overnight. She also states she has a 'knot' in her side. Discussed plan to f/u with her troponins and cardiology. Also expressed need for heparin drip while working up her chest pain.  Objective:   Vital Signs (last 24 hours): Vitals:   06/29/19 1152 06/29/19 1644 06/29/19 1836 06/29/19 1938  BP: 133/74 (!) 91/52 (!) 155/87 103/83  Pulse: 86 90 95 93  Resp: 18 19 20 18   Temp: (!) 97.5 F (36.4 C) 98.4 F (36.9 C) 98.3 F (36.8 C) 98.7 F (37.1 C)  TempSrc: Oral Oral Oral Oral  SpO2: (!) 68% 100% 100% 100%  Weight:      Height:       Physical Exam: General Alert and answers questions appropriately, no acute distress  Cardiac Regular rate and rhythm, no murmurs, rubs, or gallops. JVD present  Pulmonary Bilateral crackles in lower lung fields, no wheezing, increased work of breathing  Extremities Bilateral pitting lower extremity edema   I/Os: Net +1.1 L CBG Range: 171-236  CBC Latest Ref Rng & Units 06/30/2019 06/29/2019 06/28/2019  WBC 4.0 - 10.5 K/uL 6.7 7.3 7.1  Hemoglobin 12.0 - 15.0 g/dL 9.4(L) 8.4(L) 9.0(L)  Hematocrit 36.0 - 46.0 % 31.5(L) 28.0(L) 30.4(L)  Platelets 150 - 400 K/uL 292 242 251   BMP Latest Ref Rng & Units 06/30/2019 06/29/2019 06/28/2019  Glucose 70 - 99 mg/dL 157(H) 455(H) 241(H)  BUN 8 - 23 mg/dL 33(H) 34(H) 32(H)  Creatinine 0.44 - 1.00 mg/dL 1.92(H) 1.71(H) 1.82(H)  Sodium 135 - 145 mmol/L 140 136 139  Potassium 3.5 - 5.1 mmol/L 4.1 4.3 4.2  Chloride 98 - 111 mmol/L 100 98 102  CO2 22 - 32 mmol/L 31 29 27   Calcium 8.9 - 10.3 mg/dL 9.2 9.1 9.3   Mag: 2.0  Assessment/Plan:   Principal Problem:   Acute on chronic HFrEF (heart failure with reduced ejection fraction) [facility])  Patient is a 64 year old female with past medical history significant for HFrEF, coronary artery disease  status post PCI, COPD, CKD stage IV, asthma, hypertension, and depression who was admitted on 06/26/2019 for acute on chronic systolic heart failure.  # Atypical angina # CAD s/p mid LAD stent, D1 PTCA On 9 4/24-4/25 patient experienced substernal chest pain, relieved by nitroglycerin, EKG revealed ST depression in leads I, II, aVL, V5, and V6, ST elevation in aVR.  Troponin trend 125 -> 424 -> 720 -> 811.  Patient's last heart cath at [facility] in August 2020, LAD with diffuse noncritical calcific disease with patent stent, D1 proximal with 95% stenosis and 50% distal stenosis of circumflex artery.  Patient was placed on heparin drip, cardiology consulted.  Cardiology evaluated, recommends consideration of left heart cath for repeat symptoms or troponin > 1000.  Patient reporting intermittent substernal pressure-like chest pain. On evaluation without chest pain *Heparin gtt *Cardiology following, we appreciate their continued recommendations *Nitroglycerin PRN *Continue with isosorbide mononitrate 30 mg daily + clopidogrel 75 mg daily + atorvastatin 80 mg daily  # Acute on chronic systolic heart failure # Ischemic cardiomyopathy Most recent TTE with EF 30-35%, dry weight approximately 105-110 kg.  Last weight of 118.6 kg.  Net 2 L down since admission, positive 1.1 L since yesterday.  Diuresis is complicated by volume from heparin transfusion.  Will increase diuretic regimen. *Increase IV furosemide from 40 to 60 mg  3 times daily *Trend BMP, mag level. Goal K > 4.0, Mag > 2.0 *Strict I/O's, daily weights *Keep oxygen saturation greater than 88%  # COPD exacerbation # Acute on chronic hypoxic respiratory failure Presented with shortness of breath and wheezing, 5 L nasal cannula oxygen requirement.  Oxygen requirement now down to 3 L, her home requirement.  Patient was assessed not to be in acute COPD exacerbation, prednisone was stopped after 2 doses *Dulera 200-5 2 puffs twice daily *DuoNebs  every 6 hour as needed  # T2DM CBG range over past 24 hours 171-236, received 130 units glargine and 121 units aspart.  Expect insulin control to improve with cessation of prednisone yesterday.  On home regimen of glargine 60 units twice daily + aspart 30 units 3 times daily with meals. *Continue current inpatient regimen of glargine 65 units twice daily + aspart 30 units 3 times daily with meals (decreased from 40 units) + SSI  PT/OT: Recommend HH PT/OT, 3 N 1, orders placed Diet: Heart DVT Ppx: On therapeutic heparin Admit Status: Inpatient Dispo: Anticipated discharge pending clinical improvement  Jeanmarie Hubert, MD 06/30/2019, 6:00 AM

## 2019-06-30 NOTE — Progress Notes (Signed)
[facility] for Heparin Indication: chest pain/ACS  Allergies  Allergen Reactions  . Keppra [Levetiracetam] Anaphylaxis       . Lisinopril Anaphylaxis     Patient Measurements: Height: 5\' 4"  (162.6 cm) Weight: 118.2 kg (260 lb 8 oz) IBW/kg (Calculated) : 54.7 Heparin Dosing Weight: 83.2 kg  Vital Signs: Temp: 98.2 F (36.8 C) (04/26 1148) Temp Source: Oral (04/26 1148) BP: 120/71 (04/26 1148) Pulse Rate: 88 (04/26 1148)  Labs: Recent Labs    06/28/19 0449 06/28/19 0449 06/29/19 0234 06/29/19 0544 06/29/19 1304 06/29/19 1858 06/29/19 2135 06/30/19 0745  HGB 9.0*   < > 8.4*  --   --   --   --  9.4*  HCT 30.4*  --  28.0*  --   --   --   --  31.5*  PLT 251  --  242  --   --   --   --  292  HEPARINUNFRC  --   --   --   --  0.13*  --  0.24* <0.10*  CREATININE 1.82*  --  1.71*  --   --   --   --  1.92*  TROPONINIHS  --   --  125*   < > 720* 811*  --  533*   < > = values in this interval not displayed.    Estimated Creatinine Clearance: 38.4 mL/min (A) (by C-G formula based on SCr of 1.92 mg/dL (H)).   Medical History: Past Medical History:  Diagnosis Date  . Arthritis   . Asthma   . CHF (congestive heart failure) ([place])   . CKD (chronic kidney disease) stage 3, GFR 30-59 ml/min 02/25/2017  . COPD (chronic obstructive pulmonary disease) ([place])   . Depression 02/25/2017  . Diabetes mellitus without complication ([place])   . HTN (hypertension) 02/25/2017  . Hypercholesterolemia   . Hypertension   . MI (myocardial infarction) ([place])   . Shortness of breath dyspnea   . Stroke [facility])    R sided weakness    Assessment: 64 yo F with a hx of CAD s/p prior PCI now with new onset CP relieved by nitroglycerin during admission. Pharmacy asked to dose heparin. No AC noted PTA.  This AM, heparin level was <0.1, due to lost IV access at 0725.  RN reported patient had some nose bleeding, able to get stopped, MD aware.  Humidifer added to O2  which has helped.   -restarted heparin gtt at 10:45 at rate 1250 ut/hr.    Goal of Therapy:  Heparin level 0.3-0.7 units/ml Monitor platelets by anticoagulation protocol: Yes   Plan:  Continue heparin infusion at 1250 units/hr Check 6 hour heparin level Monitor daily HL, CBC, s/sx bleeding  Nicole Cella, RPh Clinical Pharmacist [phone]  06/30/2019 12:04 PM  Please check [email or web address] for unit-specific pharmacy phone numbers.

## 2019-06-30 NOTE — Progress Notes (Signed)
Orthopedic Tech Progress Note Patient Details:  Christine Sims January 30, 1956 [id number]  Ortho Devices Type of Ortho Device: Haematologist Ortho Device/Splint Location: RLE Ortho Device/Splint Interventions: Ordered, Application   Post Interventions Patient Tolerated: Well Instructions Provided: Care of device Reapplied right lower unna boot  Braulio Bosch 06/30/2019, 11:19 AM

## 2019-06-30 NOTE — Progress Notes (Signed)
Physical Therapy Treatment Patient Details Name: Christine Sims MRN: [id number] DOB: 05-Sep-1955 Today's Date: 06/30/2019    History of Present Illness 64 yo F w/ PMH of COPD, HFrEF, CKD who presents with shortness of breath. Admitted for acute on chronic heart failure.    PT Comments    Pt had removed O2 upon arrival for transfer to [facility]. SpO2 86% on RA with 2/4 DOE noted. 4L O2 replaced with increase to 93%. Pt required supervision transfers and supervision/min guard assist ambulation in room 20' x 2 without AD, occasionally using furniture for support. Pt only agreeable to in room mobility due to RLE pain. Upon return to recliner, she performed BLE exercises.    Follow Up Recommendations  Home health PT;Supervision for mobility/OOB     Equipment Recommendations  None recommended by PT    Recommendations for Other Services       Precautions / Restrictions Precautions Precautions: Other (comment);Fall Precaution Comments: watch sats    Mobility  Bed Mobility               General bed mobility comments: Pt sleeping in recliner while in hospital. She sleeps in lift chair at home.  Transfers Overall transfer level: Needs assistance Equipment used: None Transfers: Sit to/from Stand Sit to Stand: Supervision         General transfer comment: supervision for safety  Ambulation/Gait Ambulation/Gait assistance: Supervision;Min guard Gait Distance (Feet): 20 Feet(x 2) Assistive device: None(occasional furniture walking) Gait Pattern/deviations: Wide base of support;Step-through pattern;Decreased stride length Gait velocity: decreased Gait velocity interpretation: <1.31 ft/sec, indicative of household ambulator General Gait Details: Only agreeable to in room mobility due to RLE pain.   Stairs             Wheelchair Mobility    Modified Rankin (Stroke Patients Only)       Balance Overall balance assessment: Needs assistance Sitting-balance support:  No upper extremity supported;Feet supported Sitting balance-Leahy Scale: Good     Standing balance support: During functional activity;Single extremity supported;No upper extremity supported Standing balance-Leahy Scale: Fair                              Cognition Arousal/Alertness: Awake/alert Behavior During Therapy: WFL for tasks assessed/performed Overall Cognitive Status: Within Functional Limits for tasks assessed                                        Exercises General Exercises - Lower Extremity Ankle Circles/Pumps: AROM;Both;10 reps;Seated Gluteal Sets: AROM;Both;10 reps;Seated Long Arc Quad: AROM;Right;Left;10 reps;Seated Hip ABduction/ADduction: AROM;Both;10 reps;Seated    General Comments General comments (skin integrity, edema, etc.): SpO2 86% on RA. SpO2 93% on 4L O2.      Pertinent Vitals/Pain Pain Assessment: Faces Faces Pain Scale: Hurts even more Pain Location: R distal LE Pain Descriptors / Indicators: Throbbing;Discomfort;Constant Pain Intervention(s): Limited activity within patient's tolerance;Monitored during session    Home Living                      Prior Function            PT Goals (current goals can now be found in the care plan section) Acute Rehab PT Goals Patient Stated Goal: not stated Progress towards PT goals: Progressing toward goals    Frequency    Min 3X/week  PT Plan Current plan remains appropriate    Co-evaluation              AM-PAC PT "6 Clicks" Mobility   Outcome Measure  Help needed turning from your back to your side while in a flat bed without using bedrails?: A Little Help needed moving from lying on your back to sitting on the side of a flat bed without using bedrails?: A Lot Help needed moving to and from a bed to a chair (including a wheelchair)?: A Little Help needed standing up from a chair using your arms (e.g., wheelchair or bedside chair)?: None Help  needed to walk in hospital room?: A Little Help needed climbing 3-5 steps with a railing? : A Lot 6 Click Score: 17    End of Session Equipment Utilized During Treatment: Oxygen Activity Tolerance: Patient limited by pain Patient left: in chair;with call bell/phone within reach Nurse Communication: Mobility status PT Visit Diagnosis: Muscle weakness (generalized) (M62.81);Difficulty in walking, not elsewhere classified (R26.2)     Time: 6195-0932 PT Time Calculation (min) (ACUTE ONLY): 13 min  Charges:  $Therapeutic Exercise: 8-22 mins                     Lorrin Goodell, PT  Office # [phone] Pager [phone]    Lorriane Shire 06/30/2019, 9:59 AM

## 2019-06-30 NOTE — Progress Notes (Signed)
[facility] for Heparin Indication: chest pain/ACS  Allergies  Allergen Reactions  . Keppra [Levetiracetam] Anaphylaxis       . Lisinopril Anaphylaxis     Patient Measurements: Height: 5\' 4"  (162.6 cm) Weight: 118.2 kg (260 lb 8 oz) IBW/kg (Calculated) : 54.7 Heparin Dosing Weight: 83.2 kg  Vital Signs: Temp: 97.8 F (36.6 C) (04/26 1626) Temp Source: Oral (04/26 1148) BP: 138/75 (04/26 1626) Pulse Rate: 91 (04/26 1626)  Labs: Recent Labs    06/28/19 0449 06/28/19 0449 06/29/19 0234 06/29/19 0544 06/29/19 1304 06/29/19 1304 06/29/19 1858 06/29/19 2135 06/30/19 0745 06/30/19 1753  HGB 9.0*   < > 8.4*  --   --   --   --   --  9.4*  --   HCT 30.4*  --  28.0*  --   --   --   --   --  31.5*  --   PLT 251  --  242  --   --   --   --   --  292  --   HEPARINUNFRC  --   --   --   --  0.13*   < >  --  0.24* <0.10* 0.24*  CREATININE 1.82*  --  1.71*  --   --   --   --   --  1.92*  --   TROPONINIHS  --   --  125*   < > 720*  --  811*  --  533*  --    < > = values in this interval not displayed.    Estimated Creatinine Clearance: 38.4 mL/min (A) (by C-G formula based on SCr of 1.92 mg/dL (H)).  Assessment: 64 yo F with a hx of CAD s/p prior PCI now with new onset CP relieved by nitroglycerin during admission. Pharmacy asked to dose heparin. No AC noted PTA.  Heparin level is subtherapeutic at 0.24 on 1250 units/hr.  Nose bleeding reported earlier today but has resolved. Per RN no other problems with bleeding or infusion.    Goal of Therapy:  Heparin level 0.3-0.7 units/ml Monitor platelets by anticoagulation protocol: Yes   Plan:  Increase heparin infusion to 1400 units/hr Heparin level with am labs Monitor daily heparin level, CBC, s/sx bleeding  Thank you for involving pharmacy in this patient's care.  Renold Genta, PharmD, BCPS Clinical Pharmacist Clinical phone for 06/30/2019 until 10p is [phone] 06/30/2019 7:39  PM  **Pharmacist phone directory can be found on [email or web address] listed under [place]**

## 2019-06-30 NOTE — Progress Notes (Signed)
Patient would like to walk in the hallway during her physical therapy session tomorrow.

## 2019-06-30 NOTE — Plan of Care (Signed)
  Problem: Education: Goal: Knowledge of General Education information will improve Description Including pain rating scale, medication(s)/side effects and non-pharmacologic comfort measures Outcome: Progressing   

## 2019-07-01 DIAGNOSIS — I2510 Atherosclerotic heart disease of native coronary artery without angina pectoris: Secondary | ICD-10-CM

## 2019-07-01 LAB — GLUCOSE, CAPILLARY
Glucose-Capillary: 104 mg/dL — ABNORMAL HIGH (ref 70–99)
Glucose-Capillary: 230 mg/dL — ABNORMAL HIGH (ref 70–99)
Glucose-Capillary: 59 mg/dL — ABNORMAL LOW (ref 70–99)
Glucose-Capillary: 76 mg/dL (ref 70–99)
Glucose-Capillary: 227 mg/dL — ABNORMAL HIGH (ref 70–99)

## 2019-07-01 LAB — BASIC METABOLIC PANEL
Anion gap: 12 (ref 5–15)
BUN: 41 mg/dL — ABNORMAL HIGH (ref 8–23)
CO2: 31 mmol/L (ref 22–32)
Calcium: 9.2 mg/dL (ref 8.9–10.3)
Chloride: 97 mmol/L — ABNORMAL LOW (ref 98–111)
Creatinine, Ser: 2.05 mg/dL — ABNORMAL HIGH (ref 0.44–1.00)
GFR calc Af Amer: 29 mL/min — ABNORMAL LOW (ref >60)
GFR calc non Af Amer: 25 mL/min — ABNORMAL LOW (ref >60)
Glucose, Bld: 111 mg/dL — ABNORMAL HIGH (ref 70–99)
Potassium: 4 mmol/L (ref 3.5–5.1)
Sodium: 140 mmol/L (ref 135–145)

## 2019-07-01 LAB — CBC
HCT: 31.4 % — ABNORMAL LOW (ref 36.0–46.0)
Hemoglobin: 9.4 g/dL — ABNORMAL LOW (ref 12.0–15.0)
MCH: 25.5 pg — ABNORMAL LOW (ref 26.0–34.0)
MCHC: 29.9 g/dL — ABNORMAL LOW (ref 30.0–36.0)
MCV: 85.3 fL (ref 80.0–100.0)
Platelets: 284 10*3/uL (ref 150–400)
RBC: 3.68 MIL/uL — ABNORMAL LOW (ref 3.87–5.11)
RDW: 16.4 % — ABNORMAL HIGH (ref 11.5–15.5)
WBC: 6.7 10*3/uL (ref 4.0–10.5)
nRBC: 0 % (ref 0.0–0.2)

## 2019-07-01 LAB — HEPARIN LEVEL (UNFRACTIONATED): Heparin Unfractionated: 0.48 IU/mL (ref 0.30–0.70)

## 2019-07-01 MED ORDER — OXYCODONE HCL 5 MG PO TABS: 5.0000 mg | ORAL_TABLET | Freq: Once | ORAL | Status: DC | PRN

## 2019-07-01 MED ORDER — ISOSORBIDE MONONITRATE ER 60 MG PO TB24
60.0000 mg | ORAL_TABLET | Freq: Every day | ORAL | Status: DC
  Administered 2019-07-02 – 2019-07-04 (×3): 60 mg via ORAL
  Filled 2019-07-01 (×3): qty 1

## 2019-07-01 MED ORDER — DOXYCYCLINE HYCLATE 100 MG PO TABS
100.0000 mg | ORAL_TABLET | Freq: Two times a day (BID) | ORAL | Status: DC
  Administered 2019-07-01 – 2019-07-04 (×7): 100 mg via ORAL
  Filled 2019-07-01 (×7): qty 1

## 2019-07-01 MED ORDER — OXYCODONE HCL 5 MG PO TABS
5.0000 mg | ORAL_TABLET | Freq: Once | ORAL | Status: AC
  Administered 2019-07-01: 20:00:00 5 mg via ORAL
  Filled 2019-07-01: qty 1

## 2019-07-01 NOTE — Progress Notes (Signed)
Subjective:  Denies chest pain, but says she feels pressure on her chest. Continues to have pain in left leg.   Objective:    Vital Signs (last 24 hours): Vitals:   06/30/19 2028 06/30/19 2045 07/01/19 0300 07/01/19 0402  BP: (!) 159/75   (!) 167/77  Pulse: 100   93  Resp: 18   20  Temp: 98.4 F (36.9 C)   (!) 97.3 F (36.3 C)  TempSrc: Oral   Oral  SpO2: 100% 98%  100%  Weight:   117.7 kg   Height:       Physical Exam: General Alert and answers questions appropriately, no acute distress  Cardiac Regular rate and rhythm, no murmurs, rubs, or gallops. JVD present  Extremities Bilateral pitting edema up to knees bilaterally, no significant warmth, erythema visualized. Legs under bandages   BMP Latest Ref Rng & Units 07/01/2019 06/30/2019 06/29/2019  Glucose 70 - 99 mg/dL 111(H) 157(H) 455(H)  BUN 8 - 23 mg/dL 41(H) 33(H) 34(H)  Creatinine 0.44 - 1.00 mg/dL 2.05(H) 1.92(H) 1.71(H)  Sodium 135 - 145 mmol/L 140 140 136  Potassium 3.5 - 5.1 mmol/L 4.0 4.1 4.3  Chloride 98 - 111 mmol/L 97(L) 100 98  CO2 22 - 32 mmol/L 31 31 29   Calcium 8.9 - 10.3 mg/dL 9.2 9.2 9.1    I/Os: Net -2062 ml since yesterday @ 7am, about 4 L down since admission  Assessment/Plan:   Principal Problem:   Acute on chronic HFrEF (heart failure with reduced ejection fraction) [facility])  Patient is a 64 year old female with past medical history significant for HFrEF, coronary artery disease status post PCI, COPD, CKD stage IV, asthma, hypertension, and depression who was admitted on 06/26/2019 for acute on chronic systolic heart failure.  # Atypical angina # CAD s/p mid LAD stent, D1 PTCA On night of 4/24-4/25 patient experienced substernal chest pain, relieved by nitroglycerin, EKG revealed ST depression in leads I, II, aVL, V5, and V6, ST elevation in aVR.  Troponin trend 125 -> 424 -> 720 -> 811 -> 533.  Patient's last heart cath at [facility] in August 2020, LAD with diffuse noncritical calcific disease  with patent stent, D1 proximal with 95% stenosis and 50% distal stenosis of circumflex artery.  Patient was placed on heparin drip, cardiology consulted.  Cardiology recommends continued observation, caution for heart cath given CKD *Heparin gtt for 72 hours, last day today *Cardiology following, we appreciate their continued recommendations *Nitroglycerin PRN *Continue with isosorbide mononitrate 60 mg daily (increased from 30) + clopidogrel 75 mg daily + atorvastatin 80 mg daily  # Acute on chronic systolic heart failure # Ischemic cardiomyopathy Most recent TTE with EF 30-35%, dry weight approximately 105-110 kg.  Patient is 2 L down since yesterday, approximately 4 L since admission. *Continue IV furosemide 60 mg 3 times daily *Trend BMP, Goal K > 4.0, Mag > 2.0 *Strict I/O's, daily weights *Keep oxygen saturation greater than 88%  # COPD # Acute on chronic hypoxic respiratory failure Presented with shortness of breath, 5 L nasal cannula oxygen requirement - increased oxygen requirement likely attributable to heart failure exacerbation.  Oxygen requirement now down to 3.5 L, near her home requirement (3 L).   *Dulera 200-5 2 puffs twice daily *DuoNebs every 6 hour as needed  # Leg pain: Wound on right lower extremity visualized on admission. No evidence for infection. Will examine with wound change  PT/OT: Recommends HH PT/OT, 3 N 1, orders placed Diet: Heart DVT Ppx: On  therapeutic heparin Admit Status: Inpatient Dispo: Anticipated discharge in approximately 2-3 days  Jeanmarie Hubert, MD 07/01/2019, 6:54 AM

## 2019-07-01 NOTE — Consult Note (Signed)
[facility] Nurse Consult Note: Reason for Consult: Wounds worsening on RLE. See photos in EMR. Wound type:Venous insufficiency, Clinical CEAP 6 Pressure Injury POA: N/A Measurement: Right medial/posterior LE: Full thickness 7cm x 3cm with depth unable to be determined due to the presence of yellow slough obscuring depth. This covers 50% of wound bed.Large amount serous exudate. Right lateral LE: 2.2cm x 2.5cm with yellow "plug" of slough obscuring depth. (Full thickness). Large [place] serous exudate. Right anterior at pretibial area: 3cm x 2cm x 0.1 (Partial Thickness). Pink tissue beneath macerated epidermis. Small amount serous exudate. Wound bed:As described above. Drainage (amount, consistency, odor) AS described above. Periwound: Macerated.Intact. Dressing procedure/placement/frequency: Will leave Unna's boot off of RLE in favor os using a silver hydrofiber dressing (Aquacel Advantage) with daily changes. Securement will be with a dry boot: Kerlix roll gauze topped with a 6-inch ACE bandage wrapped from just below toes to just below knee.  Heels are to be floated.  LLE will continue with twice weekly Unna's Boots. No dressing as there are no wounds.  [facility] nursing team will not follow, but will remain available to this patient, the nursing and medical teams.  Please re-consult if needed. Thanks, Maudie Flakes, MSN, RN, [place], Arther Abbott  Pager# [phone]

## 2019-07-01 NOTE — Progress Notes (Signed)
[facility] for Heparin Indication: chest pain/ACS  Allergies  Allergen Reactions  . Keppra [Levetiracetam] Anaphylaxis       . Lisinopril Anaphylaxis     Patient Measurements: Height: 5\' 4"  (162.6 cm) Weight: 117.7 kg (259 lb 6.4 oz) IBW/kg (Calculated) : 54.7 Heparin Dosing Weight: 83.2 kg  Vital Signs: Temp: 98.3 F (36.8 C) (04/27 1123) Temp Source: Oral (04/27 1123) BP: 134/71 (04/27 1123) Pulse Rate: 92 (04/27 1123)  Labs: Recent Labs     0000 06/29/19 0234 06/29/19 0544 06/29/19 1304 06/29/19 1858 06/29/19 2135 06/30/19 0745 06/30/19 1753 07/01/19 0456  HGB   < > 8.4*  --   --   --   --  9.4*  --  9.4*  HCT  --  28.0*  --   --   --   --  31.5*  --  31.4*  PLT  --  242  --   --   --   --  292  --  284  HEPARINUNFRC  --   --   --  0.13*  --    < > <0.10* 0.24* 0.48  CREATININE  --  1.71*  --   --   --   --  1.92*  --  2.05*  TROPONINIHS  --  125*   < > 720* 811*  --  533*  --   --    < > = values in this interval not displayed.    Estimated Creatinine Clearance: 35.9 mL/min (A) (by C-G formula based on SCr of 2.05 mg/dL (H)).  Assessment: 64 yo F with a hx of CAD s/p prior PCI now with new onset CP relieved by nitroglycerin during admission. Pharmacy consulted 4/22 to dose IV heparin. No AC noted PTA.  Heparin level is therapeutic at 0.48 on 1400 units/hr.  Nose bleeding reported yesterday morning 4/26, reported resolved 06/30/19. No other bleeding noted.   No further chest pain noted. IMTS noted plan for only 72 hours of IV heparin, 4/27 to be last day of IV heparin.   Goal of Therapy:  Heparin level 0.3-0.7 units/ml Monitor platelets by anticoagulation protocol: Yes   Plan:  Continue heparin infusion to 1400 units/hr Monitor daily heparin level, CBC, s/sx bleeding  Thank you for involving pharmacy in this patient's care. Nicole Cella, RPh Clinical Pharmacist Clinical phone for 07/01/2019 until 10p is  [phone] 07/01/2019 1:23 PM  **Pharmacist phone directory can be found on [email or web address] listed under [place]**

## 2019-07-01 NOTE — Progress Notes (Signed)
Progress Note  Patient Name: Christine Sims Date of Encounter: 07/01/2019  Primary Cardiologist: Loma Boston   Subjective   64 year old female who was admitted with acute on chronic systolic congestive heart failure. Obesity,  COPD, HTN depression  She ran out of her furosemide for several days and ate lots of fast food over the past week.  She gained approximately 20 pounds.  She is had a net diuresis of 3.9 L over the hospitalization.   Inpatient Medications    Scheduled Meds: . amitriptyline  25 mg Oral QHS  . amLODipine  10 mg Oral Daily  . atorvastatin  80 mg Oral Daily  . busPIRone  15 mg Oral BID  . clopidogrel  75 mg Oral Daily  . furosemide  60 mg Intravenous TID  . gabapentin  600 mg Oral QHS  . insulin aspart  0-20 Units Subcutaneous TID WC  . insulin aspart  0-5 Units Subcutaneous QHS  . insulin aspart  30 Units Subcutaneous TID WC  . insulin glargine  65 Units Subcutaneous BID  . isosorbide mononitrate  30 mg Oral Daily  . mometasone-formoterol  2 puff Inhalation BID  . pantoprazole  20 mg Oral Daily  . sodium chloride flush  3 mL Intravenous Q12H  . spironolactone  12.5 mg Oral Daily   Continuous Infusions: . sodium chloride Stopped (06/28/19 0913)  . heparin 1,400 Units/hr (07/01/19 0618)   PRN Meds: sodium chloride, acetaminophen, ipratropium-albuterol, nitroGLYCERIN, ondansetron (ZOFRAN) IV, oxyCODONE, polyethylene glycol, sodium chloride flush, traZODone   Vital Signs    Vitals:   06/30/19 2045 07/01/19 0300 07/01/19 0402 07/01/19 0809  BP:   (!) 167/77   Pulse:   93   Resp:   20   Temp:   (!) 97.3 F (36.3 C)   TempSrc:   Oral   SpO2: 98%  100% 98%  Weight:  117.7 kg    Height:        Intake/Output Summary (Last 24 hours) at 07/01/2019 0818 Last data filed at 07/01/2019 0300 Gross per 24 hour  Intake 1437.54 ml  Output 3500 ml  Net -2062.46 ml   Last 3 Weights 07/01/2019 06/30/2019 06/29/2019  Weight (lbs) 259 lb 6.4 oz 260 lb 8  oz 261 lb 8 oz  Weight (kg) 117.663 kg 118.162 kg 118.616 kg      Telemetry   NSR   - Personally Reviewed  ECG     - Personally Reviewed  Physical Exam    Physical Exam: Blood pressure (!) 167/77, pulse 93, temperature (!) 97.3 F (36.3 C), temperature source Oral, resp. rate 20, height 5\' 4"  (1.626 m), weight 117.7 kg, SpO2 98 %.  GEN:  Morbidly obese female,    HEENT: Normal NECK: No JVD; No carotid bruits LYMPHATICS: No lymphadenopathy CARDIAC:  RR  RESPIRATORY:  Clear to auscultation without rales, wheezing or rhonchi  ABDOMEN: morbidly obese  MUSCULOSKELETAL:  No edema; No deformity  SKIN: Warm and dry NEUROLOGIC:  Alert and oriented x 3   Labs    High Sensitivity Troponin:   Recent Labs  Lab 06/29/19 0234 06/29/19 0544 06/29/19 1304 06/29/19 1858 06/30/19 0745  TROPONINIHS 125* 424* 720* 811* 533*      Chemistry Recent Labs  Lab 06/26/19 1251 06/26/19 1306 06/29/19 0234 06/30/19 0745 07/01/19 0456  NA 141   < > 136 140 140  K 3.8   < > 4.3 4.1 4.0  CL 106   < > 98 100 97*  CO2  27   < > 29 31 31   GLUCOSE 191*   < > 455* 157* 111*  BUN 24*   < > 34* 33* 41*  CREATININE 1.75*   < > 1.71* 1.92* 2.05*  CALCIUM 9.1   < > 9.1 9.2 9.2  PROT 6.6  --   --   --   --   ALBUMIN 2.8*  --   --   --   --   AST 10*  --   --   --   --   ALT 17  --   --   --   --   ALKPHOS 83  --   --   --   --   BILITOT 0.4  --   --   --   --   GFRNONAA 31*   < > 32* 27* 25*  GFRAA 36*   < > 37* 32* 29*  ANIONGAP 8   < > 9 9 12    < > = values in this interval not displayed.     Hematology Recent Labs  Lab 06/29/19 0234 06/30/19 0745 07/01/19 0456  WBC 7.3 6.7 6.7  RBC 3.30* 3.66* 3.68*  HGB 8.4* 9.4* 9.4*  HCT 28.0* 31.5* 31.4*  MCV 84.8 86.1 85.3  MCH 25.5* 25.7* 25.5*  MCHC 30.0 29.8* 29.9*  RDW 16.3* 16.5* 16.4*  PLT 242 292 284    BNP Recent Labs  Lab 06/26/19 1251  BNP 175.0*     DDimer No results for input(s): DDIMER in the last 168 hours.    Radiology    No results found.  Cardiac Studies     Patient Profile     64 y.o. female with acute on chronic combined CHF, obesity, HTN,   Assessment & Plan    1.  Acute on chronic combined systolic and diastolic congestive heart failure:  Continue to diurese.    Making progress   2.  Coronary artery disease: She has a history of stenting of the mid LAD.  She is not having any episodes of chest pain. Cont to follow   3.  Chronic renal insufficiency:   Creatinine is up slightly to 2.05.    For questions or upd4.  Morbid obesity: Encourage weight loss as an outpatient.ates, please contact [place] Please consult [email or web address] for contact info under        Signed, Mertie Moores, MD  07/01/2019, 8:18 AM

## 2019-07-02 DIAGNOSIS — I5023 Acute on chronic systolic (congestive) heart failure: Secondary | ICD-10-CM | POA: Diagnosis not present

## 2019-07-02 DIAGNOSIS — I2510 Atherosclerotic heart disease of native coronary artery without angina pectoris: Secondary | ICD-10-CM | POA: Diagnosis not present

## 2019-07-02 LAB — GLUCOSE, CAPILLARY
Glucose-Capillary: 227 mg/dL — ABNORMAL HIGH (ref 70–99)
Glucose-Capillary: 200 mg/dL — ABNORMAL HIGH (ref 70–99)
Glucose-Capillary: 148 mg/dL — ABNORMAL HIGH (ref 70–99)
Glucose-Capillary: 153 mg/dL — ABNORMAL HIGH (ref 70–99)

## 2019-07-02 LAB — BASIC METABOLIC PANEL
Anion gap: 14 (ref 5–15)
BUN: 51 mg/dL — ABNORMAL HIGH (ref 8–23)
CO2: 30 mmol/L (ref 22–32)
Calcium: 9.4 mg/dL (ref 8.9–10.3)
Chloride: 92 mmol/L — ABNORMAL LOW (ref 98–111)
Creatinine, Ser: 2.26 mg/dL — ABNORMAL HIGH (ref 0.44–1.00)
GFR calc Af Amer: 26 mL/min — ABNORMAL LOW (ref >60)
GFR calc non Af Amer: 23 mL/min — ABNORMAL LOW (ref >60)
Glucose, Bld: 260 mg/dL — ABNORMAL HIGH (ref 70–99)
Potassium: 4.5 mmol/L (ref 3.5–5.1)
Sodium: 136 mmol/L (ref 135–145)

## 2019-07-02 LAB — CBC
HCT: 30.4 % — ABNORMAL LOW (ref 36.0–46.0)
Hemoglobin: 9.1 g/dL — ABNORMAL LOW (ref 12.0–15.0)
MCH: 25.4 pg — ABNORMAL LOW (ref 26.0–34.0)
MCHC: 29.9 g/dL — ABNORMAL LOW (ref 30.0–36.0)
MCV: 84.9 fL (ref 80.0–100.0)
Platelets: 297 10*3/uL (ref 150–400)
RBC: 3.58 MIL/uL — ABNORMAL LOW (ref 3.87–5.11)
RDW: 16.7 % — ABNORMAL HIGH (ref 11.5–15.5)
WBC: 7.2 10*3/uL (ref 4.0–10.5)
nRBC: 0 % (ref 0.0–0.2)

## 2019-07-02 LAB — HEPARIN LEVEL (UNFRACTIONATED): Heparin Unfractionated: 0.37 IU/mL (ref 0.30–0.70)

## 2019-07-02 MED ORDER — ENOXAPARIN SODIUM 40 MG/0.4ML ~~LOC~~ SOLN
40.0000 mg | Freq: Every day | SUBCUTANEOUS | Status: DC
  Administered 2019-07-02 – 2019-07-03 (×2): 40 mg via SUBCUTANEOUS
  Filled 2019-07-02 (×2): qty 0.4

## 2019-07-02 MED ORDER — FUROSEMIDE 10 MG/ML IJ SOLN
60.0000 mg | Freq: Two times a day (BID) | INTRAMUSCULAR | Status: DC
  Administered 2019-07-02 – 2019-07-03 (×2): 60 mg via INTRAVENOUS
  Filled 2019-07-02 (×2): qty 6

## 2019-07-02 MED ORDER — INSULIN GLARGINE 100 UNIT/ML ~~LOC~~ SOLN
60.0000 [IU] | Freq: Two times a day (BID) | SUBCUTANEOUS | Status: DC
  Administered 2019-07-02 – 2019-07-04 (×5): 60 [IU] via SUBCUTANEOUS
  Filled 2019-07-02 (×6): qty 0.6

## 2019-07-02 MED ORDER — FUROSEMIDE 10 MG/ML IJ SOLN: 60.0000 mg | Freq: Two times a day (BID) | INTRAMUSCULAR | Status: DC

## 2019-07-02 MED ORDER — FUROSEMIDE 10 MG/ML IJ SOLN
60.0000 mg | Freq: Every day | INTRAMUSCULAR | Status: DC
  Administered 2019-07-02: 10:00:00 60 mg via INTRAVENOUS
  Filled 2019-07-02: qty 6

## 2019-07-02 MED ORDER — ENOXAPARIN SODIUM 40 MG/0.4ML ~~LOC~~ SOLN: 40.0000 mg | Freq: Every day | SUBCUTANEOUS | Status: DC

## 2019-07-02 NOTE — Progress Notes (Signed)
Inpatient Diabetes Program Recommendations  AACE/ADA: New Consensus Statement on Inpatient Glycemic Control (2015)  Target Ranges:  Prepandial:   less than 140 mg/dL      Peak postprandial:   less than 180 mg/dL (1-2 hours)      Critically ill patients:  140 - 180 mg/dL   Lab Results  Component Value Date   GLUCAP 200 (H) 07/02/2019   HGBA1C 10.0 (H) 05/05/2019    Review of Glycemic Control Results for JOLEAN, MADARIAGA (MRN [id number]) as of 07/02/2019 12:26  Ref. Range 07/01/2019 16:51 07/01/2019 17:23 07/01/2019 21:09 07/02/2019 06:15 07/02/2019 11:57  Glucose-Capillary Latest Ref Range: 70 - 99 mg/dL 59 (L) 76 227 (H) 227 (H) 200 (H)   Diabetes history: Type 2 DM Outpatient Diabetes medications: Lantus 60 units QHS, Novolog 30 units TID Current orders for Inpatient glycemic control:  Lantus 60 units QHS, Novolog 30 units TID, Novolog 0-20 units TID, Novolog 0-5 units QHS   Inpatient Diabetes Program Recommendations:    Noted hypoglycemia of 59 mg/dL yesterday following meal coverage and correction. Patient received 2 scheduled doses less than 4 hours apart.  Noted Lantus was decreased this AM.   Will follow.   Thanks, Bronson Curb, MSN, RNC-OB Diabetes Coordinator [phone] (8a-5p)

## 2019-07-02 NOTE — Progress Notes (Addendum)
Subjective:  Ms. Dimichele was examined and evaluated at bedside this AM. She mentions that her leg pain has improved with antibiotics. She states she is looking forward to being discharged. Discussed plan to continue diuresis and less frequent dosing due to her renal function. Ms.Kitko expressed understanding.  Objective:    Vital Signs (last 24 hours): Vitals:   07/01/19 1123 07/01/19 1930 07/01/19 2134 07/02/19 0435  BP: 134/71 (!) 141/64  134/86  Pulse: 92 95  91  Resp: 18 18  18   Temp: 98.3 F (36.8 C) 98.4 F (36.9 C)  97.7 F (36.5 C)  TempSrc: Oral Oral  Oral  SpO2: 98% 100% 98% 100%  Weight:    116.4 kg  Height:       Physical Exam: General Alert and answers questions appropriately, no acute distress  Cardiac Regular rate and rhythm, no murmurs, rubs, or gallops, JVD present  Pulmonary Clear to auscultation bilaterally without wheezes, rhonchi, or rales  Extremities Bilateral pitting edema up to knees, legs under unna boots       CBC Latest Ref Rng & Units 07/02/2019 07/01/2019 06/30/2019  WBC 4.0 - 10.5 K/uL 7.2 6.7 6.7  Hemoglobin 12.0 - 15.0 g/dL 9.1(L) 9.4(L) 9.4(L)  Hematocrit 36.0 - 46.0 % 30.4(L) 31.4(L) 31.5(L)  Platelets 150 - 400 K/uL 297 284 292   BMP Latest Ref Rng & Units 07/02/2019 07/01/2019 06/30/2019  Glucose 70 - 99 mg/dL 260(H) 111(H) 157(H)  BUN 8 - 23 mg/dL 51(H) 41(H) 33(H)  Creatinine 0.44 - 1.00 mg/dL 2.26(H) 2.05(H) 1.92(H)  Sodium 135 - 145 mmol/L 136 140 140  Potassium 3.5 - 5.1 mmol/L 4.5 4.0 4.1  Chloride 98 - 111 mmol/L 92(L) 97(L) 100  CO2 22 - 32 mmol/L 30 31 31   Calcium 8.9 - 10.3 mg/dL 9.4 9.2 9.2   I/Os: 2540 ml over past 24 hours, net ~6.5 L down  CBG Range: 59-260  Assessment/Plan:   Principal Problem:   Acute on chronic HFrEF (heart failure with reduced ejection fraction) [facility])  Patient is a 64 year old female with past medical history significant for HFrEF, coronary artery disease status post PCI, COPD, CKD stage IV,  asthma, hypertension, and depression who was admitted on 06/26/2019 for acute on chronic systolic heart failure.  # Type 1 vs. 2 NSTEMI # CAD s/p mid LAD stent, D1 PTCA On night of 4/24-//25 patients.  Substernal chest pain, relieved by nitroglycerin, EKG had ST depression in I, II, aVL, V5, and V6, ST elevation in aVR. Troponin trend 125 -> 424 -> 720 -> 811 -> 533. Patient was placed on heparin drip, cardiology consulted.  Patient has completed 72 hours of heparin therapy. *Discontinue heparin this AM *Nitroglycerin PRN *Continue with isosorbide mononitrate 60 mg daily (increased from 30 mg yesterday) + clopidogrel 75 mg daily + atorvastatin 80 mg daily  # Acute on chronic systolic heart failure # Ischemic cardiomyopathy Most recent TTE with EF 30-35%, dry weight approximately 105-110 kg. Patient down ~2.5 L from yesterday, 6.5 L since admission. Creatinine is uptrending: 1.92 -> 2.05 -> 2.26, was 1.75 on admission. *Decrease furosemide frequency - 60 mg 2 times daily (from 3 times daily yesterday) *Trend BMP, Goal K > 4.0, Mag > 2.0 *Strict I/Os, daily weights *Keep oxygen saturation > 88%  # T2DM CBG range over past 24 hours 59-260, received 130 units glargine and 69 units aspart. On home regimen of glargine 60 units twice daily + aspart 30 units 3 times daily with meals. *Decrease glargine to  home regimen of 60 units twice daily + aspart 30 units 3 times daily with meals + SSI  # COPD # Acute on chronic hypoxic respiratory failure Presentedwith shortness of breath, 5 L nasal cannula oxygen requirement - increased oxygen requirement likely attributable to heart failure exacerbation. Oxygen requirement now down to 3 L which is patient's home requirement *Dulera 200-5 2 puffs twice daily *DuoNebs every 6 hour as needed  # Leg pain: Wound on right lower extremity visualized on admission. No evidence for infection at that time. Re-examined yesterday and purulent drainage noted.  Evaluated by wound care nurse yesterday *Continue doxycycline 100 mg BID for 5 days - day 2/5 *Oxycodone 5mg  Q6HR PRN for pain  PT/OT: Recommends HH PT/OT, 3 N 1, orders placed Diet: Heart DVT Ppx: Renally dosed Lovenox (30 mg daily) Admit Status: Inpatient Dispo: Anticipated discharge in approximately 1-3 days  Jeanmarie Hubert, MD 07/02/2019, 6:00 AM

## 2019-07-02 NOTE — Progress Notes (Signed)
Physical Therapy Treatment Patient Details Name: Christine Sims MRN: [id number] DOB: April 16, 1955 Today's Date: 07/02/2019    History of Present Illness 64 yo F w/ PMH of COPD, HFrEF, CKD who presents with shortness of breath. Admitted for acute on chronic heart failure.    PT Comments    Pt is up in recliner on entry, but refuses to ambulate with therapy due to 8/10 pain in her R LE. Pt agreeable to seated LE therapeutic exercise and 5x sit>stand without assist. Focus of next session progressing ambulation. D/c plans remain appropriate. PT will continue to follow acutely.    Follow Up Recommendations  Home health PT;Supervision for mobility/OOB     Equipment Recommendations  None recommended by PT       Precautions / Restrictions Precautions Precautions: Fall;Other (comment) Precaution Comments: wears O2 Restrictions Weight Bearing Restrictions: No    Mobility  Bed Mobility               General bed mobility comments: in recliner  Transfers Overall transfer level: Needs assistance Equipment used: None Transfers: Sit to/from Stand Sit to Stand: Supervision         General transfer comment: supervision for safety  Ambulation/Gait             General Gait Details: pt refuses to ambulate due to 8/10 pain in R LE with ambulation          Balance Overall balance assessment: Needs assistance   Sitting balance-Leahy Scale: Good     Standing balance support: During functional activity;No upper extremity supported Standing balance-Leahy Scale: Fair                              Cognition Arousal/Alertness: Awake/alert Behavior During Therapy: WFL for tasks assessed/performed Overall Cognitive Status: Within Functional Limits for tasks assessed                                        Exercises General Exercises - Lower Extremity Ankle Circles/Pumps: AROM;Both;10 reps;Seated Quad Sets: AROM;Both;10 reps;Seated Long Arc  Quad: AROM;Right;Left;10 reps;Seated Hip ABduction/ADduction: AROM;Both;10 reps;Seated Hip Flexion/Marching: AROM;Both;10 reps;Seated    General Comments General comments (skin integrity, edema, etc.): VSS on 3L O2 via       Pertinent Vitals/Pain Pain Assessment: 0-10 Pain Score: 8  Pain Location: R distal LE Pain Descriptors / Indicators: Throbbing;Discomfort;Aching;Sore Pain Intervention(s): Limited activity within patient's tolerance;Monitored during session;Repositioned           PT Goals (current goals can now be found in the care plan section) Acute Rehab PT Goals Patient Stated Goal: to not be so worn out PT Goal Formulation: With patient Time For Goal Achievement: 07/11/19 Potential to Achieve Goals: Fair Progress towards PT goals: Not progressing toward goals - comment(limited by pain )    Frequency    Min 3X/week      PT Plan Current plan remains appropriate       AM-PAC PT "6 Clicks" Mobility   Outcome Measure  Help needed turning from your back to your side while in a flat bed without using bedrails?: A Little Help needed moving from lying on your back to sitting on the side of a flat bed without using bedrails?: A Lot Help needed moving to and from a bed to a chair (including a wheelchair)?: A Little Help needed standing up  from a chair using your arms (e.g., wheelchair or bedside chair)?: None Help needed to walk in hospital room?: A Little Help needed climbing 3-5 steps with a railing? : A Lot 6 Click Score: 17    End of Session Equipment Utilized During Treatment: Oxygen Activity Tolerance: Patient limited by pain Patient left: in chair;with call bell/phone within reach Nurse Communication: Mobility status PT Visit Diagnosis: Muscle weakness (generalized) (M62.81);Difficulty in walking, not elsewhere classified (R26.2)     Time: 7062-3762 PT Time Calculation (min) (ACUTE ONLY): 14 min  Charges:  $Therapeutic Exercise: 8-22 mins                      Mya Suell B. Migdalia Dk PT, DPT Acute Rehabilitation Services Pager [phone] Office [phone]    [place] 07/02/2019, 4:11 PM

## 2019-07-02 NOTE — Progress Notes (Signed)
Progress Note  Patient Name: Christine Sims Date of Encounter: 07/02/2019  Primary Cardiologist: Loma Boston   Subjective   64 year old female who was admitted with acute on chronic systolic congestive heart failure. Obesity,  COPD, HTN depression  She ran out of her furosemide for several days and ate lots of fast food over the past week.  She gained approximately 20 pounds.  She has diuresed a net 6.4 L over the hospitalization. Creatinine is up slightly to 2.26.  BUN is up to 51    Legs are still wrapped.  Minimal edema    Inpatient Medications    Scheduled Meds: . amitriptyline  25 mg Oral QHS  . amLODipine  10 mg Oral Daily  . atorvastatin  80 mg Oral Daily  . busPIRone  15 mg Oral BID  . clopidogrel  75 mg Oral Daily  . doxycycline  100 mg Oral Q12H  . enoxaparin (LOVENOX) injection  40 mg Subcutaneous Daily  . furosemide  60 mg Intravenous BID  . gabapentin  600 mg Oral QHS  . insulin aspart  0-20 Units Subcutaneous TID WC  . insulin aspart  0-5 Units Subcutaneous QHS  . insulin aspart  30 Units Subcutaneous TID WC  . insulin glargine  60 Units Subcutaneous BID  . isosorbide mononitrate  60 mg Oral Daily  . mometasone-formoterol  2 puff Inhalation BID  . pantoprazole  20 mg Oral Daily  . sodium chloride flush  3 mL Intravenous Q12H  . spironolactone  12.5 mg Oral Daily   Continuous Infusions: . sodium chloride Stopped (06/28/19 0913)  . heparin 1,400 Units/hr (07/01/19 2357)   PRN Meds: sodium chloride, acetaminophen, ipratropium-albuterol, nitroGLYCERIN, ondansetron (ZOFRAN) IV, oxyCODONE, polyethylene glycol, sodium chloride flush, traZODone   Vital Signs    Vitals:   07/01/19 2134 07/02/19 0435 07/02/19 0751 07/02/19 0824  BP:  134/86  (!) 153/80  Pulse:  91 82 97  Resp:  18 18   Temp:  97.7 F (36.5 C)    TempSrc:  Oral    SpO2: 98% 100% 99% 100%  Weight:  116.4 kg    Height:        Intake/Output Summary (Last 24 hours) at 07/02/2019  0832 Last data filed at 07/02/2019 0541 Gross per 24 hour  Intake 820 ml  Output 3600 ml  Net -2780 ml   Last 3 Weights 07/02/2019 07/01/2019 06/30/2019  Weight (lbs) 256 lb 9.6 oz 259 lb 6.4 oz 260 lb 8 oz  Weight (kg) 116.393 kg 117.663 kg 118.162 kg      Telemetry   NSR   - Personally Reviewed  ECG     - Personally Reviewed  Physical Exam   Physical Exam: Blood pressure (!) 153/80, pulse 97, temperature 97.7 F (36.5 C), temperature source Oral, resp. rate 18, height 5\' 4"  (1.626 m), weight 116.4 kg, SpO2 100 %.  GEN:  Middle age, morbidly obese female.   HEENT: Normal NECK: No JVD; No carotid bruits LYMPHATICS: No lymphadenopathy CARDIAC:   RESPIRATORY:  Mostly clear  ABDOMEN: Soft, non-tender, non-distended MUSCULOSKELETAL:  Legs are wrapped.  Minimal edema   SKIN: Warm and dry NEUROLOGIC:  Alert and oriented x 3   Labs    High Sensitivity Troponin:   Recent Labs  Lab 06/29/19 0234 06/29/19 0544 06/29/19 1304 06/29/19 1858 06/30/19 0745  TROPONINIHS 125* 424* 720* 811* 533*      Chemistry Recent Labs  Lab 06/26/19 1251 06/26/19 1306 06/30/19 0745 07/01/19 0456 07/02/19  0039  NA 141   < > 140 140 136  K 3.8   < > 4.1 4.0 4.5  CL 106   < > 100 97* 92*  CO2 27   < > 31 31 30   GLUCOSE 191*   < > 157* 111* 260*  BUN 24*   < > 33* 41* 51*  CREATININE 1.75*   < > 1.92* 2.05* 2.26*  CALCIUM 9.1   < > 9.2 9.2 9.4  PROT 6.6  --   --   --   --   ALBUMIN 2.8*  --   --   --   --   AST 10*  --   --   --   --   ALT 17  --   --   --   --   ALKPHOS 83  --   --   --   --   BILITOT 0.4  --   --   --   --   GFRNONAA 31*   < > 27* 25* 23*  GFRAA 36*   < > 32* 29* 26*  ANIONGAP 8   < > 9 12 14    < > = values in this interval not displayed.     Hematology Recent Labs  Lab 06/30/19 0745 07/01/19 0456 07/02/19 0039  WBC 6.7 6.7 7.2  RBC 3.66* 3.68* 3.58*  HGB 9.4* 9.4* 9.1*  HCT 31.5* 31.4* 30.4*  MCV 86.1 85.3 84.9  MCH 25.7* 25.5* 25.4*  MCHC 29.8*  29.9* 29.9*  RDW 16.5* 16.4* 16.7*  PLT 292 284 297    BNP Recent Labs  Lab 06/26/19 1251  BNP 175.0*     DDimer No results for input(s): DDIMER in the last 168 hours.   Radiology    No results found.  Cardiac Studies     Patient Profile     64 y.o. female with acute on chronic combined CHF, obesity, HTN,   Assessment & Plan    1.  Acute on chronic combined systolic and diastolic congestive heart failure:  Has diuresed well on Lasix 60 mg twice daily.  Repeat BMP BUN and creatinine are up slightly.  We will decrease the furosemide to once daily.  She should be ready to come home fairly soon.  2.  Coronary artery disease: She has a history of stenting of the mid LAD.  She denies any recent episodes of angina. Continue to follow   3.  Chronic renal insufficiency: BUN and creatinine are up slightly.  We will be decreasing the Lasix frequency as noted above.  Further plans per the internal medicine team.   For questions or upd4.  Morbid obesity: Encourage weight loss as an outpatient.ates, please contact [place] Please consult [email or web address] for contact info under        Signed, Mertie Moores, MD  07/02/2019, 8:32 AM

## 2019-07-03 ENCOUNTER — Inpatient Hospital Stay (HOSPITAL_COMMUNITY): Payer: Medicaid Other

## 2019-07-03 DIAGNOSIS — I5023 Acute on chronic systolic (congestive) heart failure: Secondary | ICD-10-CM | POA: Diagnosis not present

## 2019-07-03 DIAGNOSIS — M79609 Pain in unspecified limb: Secondary | ICD-10-CM | POA: Diagnosis not present

## 2019-07-03 LAB — BASIC METABOLIC PANEL
Anion gap: 10 (ref 5–15)
BUN: 59 mg/dL — ABNORMAL HIGH (ref 8–23)
CO2: 32 mmol/L (ref 22–32)
Calcium: 9.5 mg/dL (ref 8.9–10.3)
Chloride: 97 mmol/L — ABNORMAL LOW (ref 98–111)
Creatinine, Ser: 2.44 mg/dL — ABNORMAL HIGH (ref 0.44–1.00)
GFR calc Af Amer: 24 mL/min — ABNORMAL LOW (ref >60)
GFR calc non Af Amer: 21 mL/min — ABNORMAL LOW (ref >60)
Glucose, Bld: 75 mg/dL (ref 70–99)
Potassium: 4.6 mmol/L (ref 3.5–5.1)
Sodium: 139 mmol/L (ref 135–145)

## 2019-07-03 LAB — GLUCOSE, CAPILLARY
Glucose-Capillary: 102 mg/dL — ABNORMAL HIGH (ref 70–99)
Glucose-Capillary: 108 mg/dL — ABNORMAL HIGH (ref 70–99)
Glucose-Capillary: 232 mg/dL — ABNORMAL HIGH (ref 70–99)
Glucose-Capillary: 195 mg/dL — ABNORMAL HIGH (ref 70–99)

## 2019-07-03 LAB — CBC
HCT: 33.6 % — ABNORMAL LOW (ref 36.0–46.0)
Hemoglobin: 10.1 g/dL — ABNORMAL LOW (ref 12.0–15.0)
MCH: 25.8 pg — ABNORMAL LOW (ref 26.0–34.0)
MCHC: 30.1 g/dL (ref 30.0–36.0)
MCV: 85.7 fL (ref 80.0–100.0)
Platelets: 294 10*3/uL (ref 150–400)
RBC: 3.92 MIL/uL (ref 3.87–5.11)
RDW: 16.9 % — ABNORMAL HIGH (ref 11.5–15.5)
WBC: 7.6 10*3/uL (ref 4.0–10.5)
nRBC: 0 % (ref 0.0–0.2)

## 2019-07-03 LAB — TROPONIN I (HIGH SENSITIVITY)
Troponin I (High Sensitivity): 91 ng/L — ABNORMAL HIGH (ref <18)
Troponin I (High Sensitivity): 87 ng/L — ABNORMAL HIGH (ref <18)

## 2019-07-03 MED ORDER — FUROSEMIDE 10 MG/ML IJ SOLN: 60.0000 mg | Freq: Every day | INTRAMUSCULAR | Status: DC

## 2019-07-03 MED ORDER — ENOXAPARIN SODIUM 30 MG/0.3ML ~~LOC~~ SOLN
30.0000 mg | Freq: Every day | SUBCUTANEOUS | Status: DC
  Administered 2019-07-04: 09:00:00 30 mg via SUBCUTANEOUS
  Filled 2019-07-03: qty 0.3

## 2019-07-03 MED ORDER — FUROSEMIDE 40 MG PO TABS
60.0000 mg | ORAL_TABLET | Freq: Two times a day (BID) | ORAL | Status: DC
  Administered 2019-07-03 – 2019-07-04 (×2): 60 mg via ORAL
  Filled 2019-07-03 (×3): qty 1

## 2019-07-03 NOTE — Progress Notes (Signed)
Physical Therapy Treatment Patient Details Name: Christine Sims MRN: [id number] DOB: 1955/12/16 Today's Date: 07/03/2019    History of Present Illness 64 yo F w/ PMH of COPD, HFrEF, CKD who presents with shortness of breath. Admitted for acute on chronic heart failure.    PT Comments    Pt continues to c/o 8/10 pain RLE, exacerbated by weight bearing. This is hindering her ability to progress gait. She is now using RW in room, ambulating to/from [facility] supervision. Pt agreeable to LE exercises seated in recliner. Pt has rollator for use at home.     Follow Up Recommendations  Home health PT;Supervision for mobility/OOB     Equipment Recommendations  None recommended by PT    Recommendations for Other Services       Precautions / Restrictions Precautions Precautions: Fall;Other (comment) Precaution Comments: wears O2    Mobility  Bed Mobility               General bed mobility comments: in recliner  Transfers Overall transfer level: Needs assistance Equipment used: Ambulation equipment used Transfers: Sit to/from Stand Sit to Stand: Supervision         General transfer comment: supervision for safety  Ambulation/Gait Ambulation/Gait assistance: Supervision Gait Distance (Feet): 10 Feet(x 2) Assistive device: Rolling walker (2 wheeled) Gait Pattern/deviations: Wide base of support;Step-through pattern;Decreased stride length Gait velocity: decreased   General Gait Details: distance limited by R LE. Pt now utilizing RW to decrease WB load RLE. She has rollator at home.   Stairs             Wheelchair Mobility    Modified Rankin (Stroke Patients Only)       Balance Overall balance assessment: Needs assistance Sitting-balance support: No upper extremity supported;Feet supported Sitting balance-Leahy Scale: Good     Standing balance support: During functional activity;No upper extremity supported;Bilateral upper extremity supported Standing  balance-Leahy Scale: Fair Standing balance comment: static stand without UE support. Amb with RW.                            Cognition Arousal/Alertness: Awake/alert Behavior During Therapy: WFL for tasks assessed/performed Overall Cognitive Status: Within Functional Limits for tasks assessed                                        Exercises General Exercises - Lower Extremity Ankle Circles/Pumps: AROM;Both;10 reps;Seated Long Arc Quad: AROM;Right;Left;10 reps;Seated Hip ABduction/ADduction: AROM;Both;10 reps;Seated Hip Flexion/Marching: AROM;Both;10 reps;Seated    General Comments General comments (skin integrity, edema, etc.): SpO2 85% on 2L. Pt requesting increase O2 to 2.5L but no higher. RN notified of sats.      Pertinent Vitals/Pain Pain Assessment: 0-10 Pain Score: 8  Pain Location: R distal LE Pain Descriptors / Indicators: Throbbing;Discomfort;Aching;Sore Pain Intervention(s): Limited activity within patient's tolerance;Monitored during session;Patient requesting pain meds-RN notified    Home Living                      Prior Function            PT Goals (current goals can now be found in the care plan section) Acute Rehab PT Goals Patient Stated Goal: decrease pain Progress towards PT goals: Not progressing toward goals - comment(limited by pain)    Frequency    Min 3X/week  PT Plan Current plan remains appropriate    Co-evaluation              AM-PAC PT "6 Clicks" Mobility   Outcome Measure  Help needed turning from your back to your side while in a flat bed without using bedrails?: A Little Help needed moving from lying on your back to sitting on the side of a flat bed without using bedrails?: A Lot Help needed moving to and from a bed to a chair (including a wheelchair)?: A Little Help needed standing up from a chair using your arms (e.g., wheelchair or bedside chair)?: None Help needed to walk in  hospital room?: A Little Help needed climbing 3-5 steps with a railing? : A Lot 6 Click Score: 17    End of Session Equipment Utilized During Treatment: Oxygen Activity Tolerance: Patient limited by pain Patient left: in chair;with call bell/phone within reach Nurse Communication: Mobility status;Patient requests pain meds PT Visit Diagnosis: Muscle weakness (generalized) (M62.81);Difficulty in walking, not elsewhere classified (R26.2)     Time: 8381-8403 PT Time Calculation (min) (ACUTE ONLY): 16 min  Charges:  $Therapeutic Exercise: 8-22 mins                     Lorrin Goodell, PT  Office # [phone] Pager [phone]    Christine Sims 07/03/2019, 11:14 AM

## 2019-07-03 NOTE — Progress Notes (Signed)
Patient will go back on tele monitoring per MD orders

## 2019-07-03 NOTE — Progress Notes (Signed)
Patient requests to delay evening medications until she is eligible to receive another dose of oxycodone. Verified with pharmacy that 2200 medications (insulin glargine not withstanding) could be safely moved to midnight. Pharmacy verbally confirmed that doing so would be safe.  Patient stated that she was to receive another dose at 2314. Explained to patient that our records indicate her last dose was at 1751, placing the next possible dose at 2351. Patient insisted that she received the previous dose at 1714. Will give dose as indicated by our records no sooner than 2351.

## 2019-07-03 NOTE — Progress Notes (Signed)
Subjective:  Christine Sims was examined and evaluated at bedside this AM. She mentions having significant pain of her R leg. She mentions having pain in her right calf exacerbated by weight-bearing. She mentions her R leg feeling heavy. She mentions also having additional chest pain overnight that started R sided and radiated to her left side.  Objective:    Vital Signs (last 24 hours): Vitals:   07/02/19 1923 07/02/19 1938 07/03/19 0609 07/03/19 0615  BP:  (!) 156/91 (!) 143/56   Pulse: (!) 102 100 98   Resp: (!) 22 18 18    Temp:  98.4 F (36.9 C) 97.6 F (36.4 C)   TempSrc:  Oral Oral   SpO2: 97% 99% 98%   Weight:    114.9 kg  Height:       Physical Exam: General Alert and answers questions appropriately, no acute distress  Pulmonary Breathing comfortably on 3 L [place], no cough, no respiratory distress  Extremities Mild bilateral pitting edema, improved since yesterday. Calf pain present, positive Homan's sign   CBC Latest Ref Rng & Units 07/03/2019 07/02/2019 07/01/2019  WBC 4.0 - 10.5 K/uL 7.6 7.2 6.7  Hemoglobin 12.0 - 15.0 g/dL 10.1(L) 9.1(L) 9.4(L)  Hematocrit 36.0 - 46.0 % 33.6(L) 30.4(L) 31.4(L)  Platelets 150 - 400 K/uL 294 297 284   BMP Latest Ref Rng & Units 07/03/2019 07/02/2019 07/01/2019  Glucose 70 - 99 mg/dL 75 260(H) 111(H)  BUN 8 - 23 mg/dL 59(H) 51(H) 41(H)  Creatinine 0.44 - 1.00 mg/dL 2.44(H) 2.26(H) 2.05(H)  Sodium 135 - 145 mmol/L 139 136 140  Potassium 3.5 - 5.1 mmol/L 4.6 4.5 4.0  Chloride 98 - 111 mmol/L 97(L) 92(L) 97(L)  CO2 22 - 32 mmol/L 32 30 31  Calcium 8.9 - 10.3 mg/dL 9.5 9.4 9.2   Net 1.4 L out  Assessment/Plan:   Principal Problem:   Acute on chronic HFrEF (heart failure with reduced ejection fraction) [facility])  Patient is a 64 year old female with past medical history significant for HFrEF, coronary artery disease status post PCI, COPD, CKD stage IV, asthma, hypertension, and depression who was admitted on 06/26/2019 for acute on chronic  systolic heart failure.  # Acute on chronic systolic heart failure # Ischemic cardiomyopathy Most recent TTE with EF 30-35%, dry weight approximately 105-110 kg. Patient down ~1.4 L from yesterday, 8 L since admission. Body mass 114.9 kg this AM. Creatinine is uptrending: 1.92 -> 2.05 -> 2.26 -> 2.44. Potassium WNL *Lasix 60 mg PO twice daily *Trend BMP, Goal K > 4.0, Mag > 2.0 *Strict I/Os, daily weights *Keep oxygen saturation > 88%  # Type 1 vs. 2 NSTEMI # CAD s/p mid LAD stent, D1 PTCA On night of 4/24-//25 patients.  Substernal chest pain, relieved by nitroglycerin, EKG had ST depression in I, II, aVL, V5, and V6, ST elevation in aVR. Troponin trend 125 -> 424 -> 720 -> 811 -> 533. Patient was placed on heparin drip, cardiology consulted.  Patient has completed 72 hours of heparin therapy. Patient reporting additional substernal chest pain overnight. Will check troponin, EKG *Followup troponin, EKG *Heparin discontinued on 07/02/19 *Nitroglycerin PRN *Continue with isosorbide mononitrate 60 mg daily (increased from 30 mg yesterday) + clopidogrel 75 mg daily + atorvastatin 80 mg daily  # T2DM CBG range over past 24 hours 75-200, received 120 units glargine and 104 units aspart.On home regimen of glargine 60 units twice daily + aspart 30units 3 times daily with meals. *Decrease glargine to home regimen of 60  units twice daily + aspart30 units 3 times daily with meals+ SSI  # COPD # Acute on chronic hypoxic respiratory failure Presentedwith shortness of breath, 5 L nasal cannula oxygen requirement- increased oxygen requirement likely attributable to heart failure exacerbation. Oxygen requirement now down to 3 L which is patient's home requirement *Dulera 200-5 2 puffs twice daily *DuoNebs every 6 hour as needed  # Purulent cellulitis: Wound on right lower extremity visualized on admission. No evidence for infection at that time. Re-examined on 4/27 and purulent drainage noted.  Evaluated by wound care nurse on 4/27.  *Continue doxycycline 100 mg BID for 5 days - day 3/5 *Oxycodone 5mg  Q6HR PRN for pain  # Leg pain Leg pain may be secondary to cellulitis. Given exam findings, concern for DVT. Patient has been on either heparin or lovenox which lowers risk. Will check dopplers *Followup doppler ultrasound  PT/OT:Recommends HH PT/OT, 3 N 1, orders placed Diet:Heart DVT RWC:HJSCBIP dosed Lovenox (30 mg daily) Admit Status:Inpatient Dispo: Anticipated discharge inapproximately 1-2 days  Jeanmarie Hubert, MD 07/03/2019, 7:29 AM

## 2019-07-03 NOTE — Progress Notes (Signed)
Lower extremity venous has been completed.   Preliminary results in CV Proc.   Abram Sander 07/03/2019 11:42 AM

## 2019-07-03 NOTE — Progress Notes (Signed)
Progress Note  Patient Name: Christine Sims Date of Encounter: 07/03/2019  Primary Cardiologist: Loma Boston   Subjective   64 year old female who was admitted with acute on chronic systolic congestive heart failure. Obesity,  COPD, HTN depression  She ran out of her furosemide for several days and ate lots of fast food over the past week.  She gained approximately 20 pounds.  She has diuresed a net 6.4 L over the hospitalization. BUN and creatinine are both slightly up further than yesterday. She is diuresed 7 L since the start of this admission.  Legs are still wrapped.  Minimal edema    Inpatient Medications    Scheduled Meds: . amitriptyline  25 mg Oral QHS  . amLODipine  10 mg Oral Daily  . atorvastatin  80 mg Oral Daily  . busPIRone  15 mg Oral BID  . clopidogrel  75 mg Oral Daily  . doxycycline  100 mg Oral Q12H  . enoxaparin (LOVENOX) injection  40 mg Subcutaneous Daily  . furosemide  60 mg Intravenous BID  . gabapentin  600 mg Oral QHS  . insulin aspart  0-20 Units Subcutaneous TID WC  . insulin aspart  0-5 Units Subcutaneous QHS  . insulin aspart  30 Units Subcutaneous TID WC  . insulin glargine  60 Units Subcutaneous BID  . isosorbide mononitrate  60 mg Oral Daily  . mometasone-formoterol  2 puff Inhalation BID  . pantoprazole  20 mg Oral Daily  . sodium chloride flush  3 mL Intravenous Q12H  . spironolactone  12.5 mg Oral Daily   Continuous Infusions: . sodium chloride Stopped (06/28/19 0913)   PRN Meds: sodium chloride, acetaminophen, ipratropium-albuterol, nitroGLYCERIN, ondansetron (ZOFRAN) IV, oxyCODONE, polyethylene glycol, sodium chloride flush, traZODone   Vital Signs    Vitals:   07/03/19 0609 07/03/19 0615 07/03/19 0741 07/03/19 0745  BP: (!) 143/56  (!) 156/85   Pulse: 98  (!) 103   Resp: 18  16   Temp: 97.6 F (36.4 C)  (!) 97.4 F (36.3 C)   TempSrc: Oral  Oral   SpO2: 98%  100% 100%  Weight:  114.9 kg    Height:         Intake/Output Summary (Last 24 hours) at 07/03/2019 0921 Last data filed at 07/03/2019 0804 Gross per 24 hour  Intake 1203 ml  Output 2200 ml  Net -997 ml   Last 3 Weights 07/03/2019 07/02/2019 07/01/2019  Weight (lbs) 253 lb 6.4 oz 256 lb 9.6 oz 259 lb 6.4 oz  Weight (kg) 114.941 kg 116.393 kg 117.663 kg      Telemetry   NSR   - Personally Reviewed  ECG     - Personally Reviewed  Physical Exam   Physical Exam: Blood pressure (!) 156/85, pulse (!) 103, temperature (!) 97.4 F (36.3 C), temperature source Oral, resp. rate 16, height 5\' 4"  (1.626 m), weight 114.9 kg, SpO2 100 %.  GEN:   Middle age female, moderately obese  HEENT: Normal NECK: No JVD; No carotid bruits LYMPHATICS: No lymphadenopathy CARDIAC: RRR  RESPIRATORY:  Clear to auscultation without rales, wheezing or rhonchi  ABDOMEN: Soft, non-tender, non-distended MUSCULOSKELETAL:  1-2 + pitting edema.  Legs wrapped.  SKIN: Warm and dry NEUROLOGIC:  Alert and oriented x 3   Labs    High Sensitivity Troponin:   Recent Labs  Lab 06/29/19 0234 06/29/19 0544 06/29/19 1304 06/29/19 1858 06/30/19 0745  TROPONINIHS 125* 424* 720* 811* 533*  Chemistry Recent Labs  Lab 06/26/19 1251 06/26/19 1306 07/01/19 0456 07/02/19 0039 07/03/19 0344  NA 141   < > 140 136 139  K 3.8   < > 4.0 4.5 4.6  CL 106   < > 97* 92* 97*  CO2 27   < > 31 30 32  GLUCOSE 191*   < > 111* 260* 75  BUN 24*   < > 41* 51* 59*  CREATININE 1.75*   < > 2.05* 2.26* 2.44*  CALCIUM 9.1   < > 9.2 9.4 9.5  PROT 6.6  --   --   --   --   ALBUMIN 2.8*  --   --   --   --   AST 10*  --   --   --   --   ALT 17  --   --   --   --   ALKPHOS 83  --   --   --   --   BILITOT 0.4  --   --   --   --   GFRNONAA 31*   < > 25* 23* 21*  GFRAA 36*   < > 29* 26* 24*  ANIONGAP 8   < > 12 14 10    < > = values in this interval not displayed.     Hematology Recent Labs  Lab 07/01/19 0456 07/02/19 0039 07/03/19 0344  WBC 6.7 7.2 7.6  RBC  3.68* 3.58* 3.92  HGB 9.4* 9.1* 10.1*  HCT 31.4* 30.4* 33.6*  MCV 85.3 84.9 85.7  MCH 25.5* 25.4* 25.8*  MCHC 29.9* 29.9* 30.1  RDW 16.4* 16.7* 16.9*  PLT 284 297 294    BNP Recent Labs  Lab 06/26/19 1251  BNP 175.0*     DDimer No results for input(s): DDIMER in the last 168 hours.   Radiology    No results found.  Cardiac Studies     Patient Profile     64 y.o. female with acute on chronic combined CHF, obesity, HTN,   Assessment & Plan    1.  Acute on chronic combined systolic and diastolic congestive heart failure:  I have reduced the lasix to once daily .  She needs to elevated her legs.   2.  Coronary artery disease: History of stenting of her LAD.  She is not having any angina.  3.  Chronic renal insufficiency: BUN and creatinine continue to slowly increase.  Further plans per the internal medicine team.   4.  Morbid obesity:  Needs weight loss   For questions or upd  ates, please contact [place] Please consult [email or web address] for contact info under        Signed, Mertie Moores, MD  07/03/2019, 9:21 AM

## 2019-07-04 DIAGNOSIS — N1831 Chronic kidney disease, stage 3a: Secondary | ICD-10-CM

## 2019-07-04 LAB — BASIC METABOLIC PANEL
Anion gap: 16 — ABNORMAL HIGH (ref 5–15)
BUN: 73 mg/dL — ABNORMAL HIGH (ref 8–23)
CO2: 26 mmol/L (ref 22–32)
Calcium: 9.5 mg/dL (ref 8.9–10.3)
Chloride: 94 mmol/L — ABNORMAL LOW (ref 98–111)
Creatinine, Ser: 2.29 mg/dL — ABNORMAL HIGH (ref 0.44–1.00)
GFR calc Af Amer: 26 mL/min — ABNORMAL LOW (ref >60)
GFR calc non Af Amer: 22 mL/min — ABNORMAL LOW (ref >60)
Glucose, Bld: 89 mg/dL (ref 70–99)
Potassium: 4.7 mmol/L (ref 3.5–5.1)
Sodium: 136 mmol/L (ref 135–145)

## 2019-07-04 LAB — CBC
HCT: 34.3 % — ABNORMAL LOW (ref 36.0–46.0)
Hemoglobin: 10.2 g/dL — ABNORMAL LOW (ref 12.0–15.0)
MCH: 25.5 pg — ABNORMAL LOW (ref 26.0–34.0)
MCHC: 29.7 g/dL — ABNORMAL LOW (ref 30.0–36.0)
MCV: 85.8 fL (ref 80.0–100.0)
Platelets: 308 10*3/uL (ref 150–400)
RBC: 4 MIL/uL (ref 3.87–5.11)
RDW: 17 % — ABNORMAL HIGH (ref 11.5–15.5)
WBC: 7.9 10*3/uL (ref 4.0–10.5)
nRBC: 0 % (ref 0.0–0.2)

## 2019-07-04 LAB — GLUCOSE, CAPILLARY
Glucose-Capillary: 98 mg/dL (ref 70–99)
Glucose-Capillary: 202 mg/dL — ABNORMAL HIGH (ref 70–99)

## 2019-07-04 MED ORDER — ISOSORBIDE MONONITRATE ER 60 MG PO TB24: 60.0000 mg | ORAL_TABLET | Freq: Every day | ORAL | 0 refills | Status: AC

## 2019-07-04 MED ORDER — DOXYCYCLINE HYCLATE 100 MG PO TABS: 100.0000 mg | ORAL_TABLET | Freq: Two times a day (BID) | ORAL | 0 refills | Status: AC

## 2019-07-04 MED ORDER — ENOXAPARIN SODIUM 40 MG/0.4ML ~~LOC~~ SOLN: 40.0000 mg | Freq: Every day | SUBCUTANEOUS | Status: DC

## 2019-07-04 MED ORDER — NITROGLYCERIN 0.4 MG SL SUBL: 0.4000 mg | SUBLINGUAL_TABLET | SUBLINGUAL | 0 refills | Status: AC | PRN

## 2019-07-04 MED ORDER — OXYCODONE HCL 5 MG PO TABS: 5.0000 mg | ORAL_TABLET | Freq: Four times a day (QID) | ORAL | 0 refills | Status: AC | PRN

## 2019-07-04 MED ORDER — FUROSEMIDE 40 MG PO TABS: 60.0000 mg | ORAL_TABLET | Freq: Two times a day (BID) | ORAL | 0 refills | Status: AC

## 2019-07-04 MED FILL — NITROGLYCERIN 0.4 MG TAB SL: 0.4 | 8 days supply | Qty: 25 | Fill #0

## 2019-07-04 MED FILL — DOXYCYCLINE HYCLATE 100 MG: 100 | 2 days supply | Qty: 3 | Fill #0

## 2019-07-04 MED FILL — ISOSORBIDE MN ER 60 MG TAB: 60 | 30 days supply | Qty: 30 | Fill #0

## 2019-07-04 MED FILL — oxyCODONE HCL 5 MG TABS: 5 | 5 days supply | Qty: 20 | Fill #0

## 2019-07-04 MED FILL — FUROSEMIDE 40 MG TABLET: 40 | 40 days supply | Qty: 120 | Fill #0

## 2019-07-04 NOTE — Plan of Care (Signed)

## 2019-07-04 NOTE — Discharge Instructions (Signed)
You were seen in the hospital for heart failure exacerbation and chest pain. Here are my recommendations:  1) Please take Lasix 60 mg (1 and 1/2 tablets) twice a day.  Weigh yourself when you get home and use this weight as your "dry weight".  If you are more than 3 pounds above your dry weight on following days, increase your Lasix to 80 mg (2 tablets) twice a day.  Return to your 60 mg twice a day dosage when your weight has returned to your dry weight.  2) You were given doxycycline to treat your infection on your leg.  To complete your course of antibiotics, please take doxycycline once tonight and then once tomorrow morning and tomorrow evening.  3) Please continue taking your amlodipine and metoprolol for your blood pressure.  Continue to hold off on taking your doxazosin or your hydralazine until you follow-up with your primary care provider.  Please discuss these medications with them. They may advise you to restart these medications  4) We have increased the dose of your Imdur to help prevent chest pain, please see her medication list  Thank you for allowing [place] to be part of your medical care!

## 2019-07-04 NOTE — Progress Notes (Signed)
Subjective:  Discussed finding from yesterday. Further discussed lasix dose and explained importance of good diuresis as outpatient. Used teach back to insure patient understood how to take lasix and when to call her primary care doctor. Plan to continue antibiotic on discharge. Discussed discharge today and patient reports she will need to call her daughter. She will need help with transportaton.   Objective:   Vital Signs (last 24 hours): Vitals:   07/03/19 1155 07/03/19 1741 07/03/19 1933 07/03/19 2124  BP: 139/73 (!) 143/57  139/70  Pulse: 98 76 100 95  Resp: 18   19  Temp: (!) 97.4 F (36.3 C)   98.4 F (36.9 C)  TempSrc: Oral   Oral  SpO2: 100%  95% 100%  Weight:      Height:       Physical Exam: General Alert and answers questions appropriately, no acute distress  Cardiac Regular rate and rhythm, no murmurs, rubs, or gallops  Pulmonary Clear to auscultation bilaterally without wheezes, rhonchi, or rales  Extremities Mild bilateral pitting edema, improved since admission   Net Output: -1.6 L Cumulative: -6.2 L  CBC Latest Ref Rng & Units 07/04/2019 07/03/2019 07/02/2019  WBC 4.0 - 10.5 K/uL 7.9 7.6 7.2  Hemoglobin 12.0 - 15.0 g/dL 10.2(L) 10.1(L) 9.1(L)  Hematocrit 36.0 - 46.0 % 34.3(L) 33.6(L) 30.4(L)  Platelets 150 - 400 K/uL 308 294 297   BMP Latest Ref Rng & Units 07/04/2019 07/03/2019 07/02/2019  Glucose 70 - 99 mg/dL 89 75 260(H)  BUN 8 - 23 mg/dL 73(H) 59(H) 51(H)  Creatinine 0.44 - 1.00 mg/dL 2.29(H) 2.44(H) 2.26(H)  Sodium 135 - 145 mmol/L 136 139 136  Potassium 3.5 - 5.1 mmol/L 4.7 4.6 4.5  Chloride 98 - 111 mmol/L 94(L) 97(L) 92(L)  CO2 22 - 32 mmol/L 26 32 30  Calcium 8.9 - 10.3 mg/dL 9.5 9.5 9.4    Assessment/Plan:   Principal Problem:   Acute on chronic HFrEF (heart failure with reduced ejection fraction) [facility])  Patient is a 64 year old female with past medical history significant for HFrEF, coronary artery disease status post PCI, COPD, CKD stage IV,  asthma, hypertension, and depression who was admitted on 06/26/2019 for acute on chronic systolic heart failure.  # Acute on chronic systolic heart failure # Ischemic cardiomyopathy Most recent TTE with E 30-35%, dry weight approximately 105-110 kg. Patient down 6.2 L since admission, 1.6 L since yesterday. Creatinine trend: 1.92 -> 2.05 -> 2.26 -> 2.44 -> 2.29. Potassium WNL *Lasix 60 mg PO twice daily, can self up-titrate to 80 mg PO twice daily for weight gain  # Typ1 vs 2 NSTEMI # CAD s/p mid LAD stent , D1 PTCA On night of 4/24-//25 patients. Substernal chest pain, relieved by nitroglycerin, EKG had ST depression in I,II, aVL, V5, and V6, ST elevation in aVR.Troponin trend 125 -> 424 -> 720 -> 811 -> 533 -> 91 -> 87.Patient was initially placed on heparin drip, cardiology consulted. Patient completed 72 hours of heparin therapy. Yesterday, patient reported occasional chest pain. Repeat troponin of 87, EKG without significant change *Nitroglycerin PRN *Continue with isosorbide mononitrate 60 mg daily (increased from 30 mg yesterday) + clopidogrel 75 mg daily + atorvastatin 80 mg daily  # T2DM CBG range over past 24 hours89-232, received 120 units glargine 97units aspart.On home regimen of glargine 60 units twice daily + aspart 30units 3 times daily with meals. *Continue regimen of 60 units twice daily+ aspart30 units 3 times daily with meals+ SSI *Discharge on  home regimen  # COPD # Acute on chronic hypoxic respiratory failure Presentedwith shortness of breath, 5 L nasal cannula oxygen requirement- increased oxygen requirement likely attributable to heart failure exacerbation. Oxygen requirement now down to3 L which is patient's home requirement *Dulera 200-5 2 puffs twice daily *DuoNebs every 6 hour as needed  # Purulent cellulitis: Wound on right lower extremity visualized on admission. No evidence for infectionat that time. Re-examined on 4/27 and purulent drainage  noted. Evaluated by wound care nurse on 4/27. With reported calf pain, doppler ultrasound performed which was negativefor DVT *Continue doxycycline 100 mg BID for 5 days - day 4/5, 3 doses remaining *Oxycodone 5mg  Q6HR PRN for pain - received 3x in past 24 hours, discharge with 5 day supply  PT/OT: Recommends HH PT/OT, 3 N 1, orders placed Diet: Heart DVT Ppx: Lovenox 30 mg daily Admit Status: Inpatient Dispo: Anticipated discharge today  Jeanmarie Hubert, MD 07/04/2019, 5:59 AM

## 2019-07-04 NOTE — Progress Notes (Addendum)
OT Cancellation Note  Patient Details Name: Christine Sims MRN: [id number] DOB: 02/16/1956   Cancelled Treatment:    Reason Eval/Treat Not Completed: Pain limiting ability to participate;Other (comment) Pt decline OT session this morning d/t pain and reports, "its too early." Will check back as time allows for skilled OT session.   Lanier Clam., COTA/L Acute Rehabilitation Services [phone] [place] 07/04/2019, 8:07 AM

## 2019-07-04 NOTE — Discharge Summary (Signed)
Name: Christine Sims MRN: [id number] DOB: 20-Jul-1955 64 y.o. PCP: Christine Glee., MD  Date of Admission: 06/26/2019 12:14 PM Date of Discharge: 07/04/2019 Attending Physician: Christine Falcon, MD  Discharge Diagnosis: 1. Acute on chronic systolic heart failure 2. Acute on chronic hypoxic respiratory failure 3. Ischemic cardiomyopathy 4. Coronary artery disease 5. Anginal chest pain 6. Right lower extremity cellulitis 7. Chronic obstructive pulmonary disease 8. Hypertension 9. Chronic Kidney Disease, Stage 4  Discharge Medications: Allergies as of 07/04/2019      Reactions   Keppra [levetiracetam] Anaphylaxis      Lisinopril Anaphylaxis      Medication List    STOP taking these medications   diclofenac sodium 1 % Gel Commonly known as: VOLTAREN   doxazosin 2 MG tablet Commonly known as: CARDURA   hydrALAZINE 25 MG tablet Commonly known as: APRESOLINE     TAKE these medications   acetaminophen 500 MG tablet Commonly known as: TYLENOL Take 1,000 mg by mouth every 6 (six) hours as needed (for pain or headaches).   albuterol 108 (90 Base) MCG/ACT inhaler Commonly known as: VENTOLIN HFA Inhale 2 puffs into the lungs every 4 (four) hours as needed for wheezing or shortness of breath.   albuterol (2.5 MG/3ML) 0.083% nebulizer solution Commonly known as: PROVENTIL Take 3 mLs (2.5 mg total) by nebulization every 4 (four) hours as needed for wheezing or shortness of breath. For wheezing. Use 3 times daily x 3 days then ecery 4 hours as needed.   amitriptyline 25 MG tablet Commonly known as: ELAVIL Take 25 mg by mouth at bedtime.   amLODipine 10 MG tablet Commonly known as: NORVASC Take 10 mg by mouth daily.   aspirin 81 MG EC tablet Take 81 mg by mouth daily.   atorvastatin 80 MG tablet Commonly known as: LIPITOR Take 80 mg by mouth daily.   blood glucose meter kit and supplies Dispense based on patient and insurance preference. Use up to four times  daily as directed. (FOR ICD-10 E10.9, E11.9).   budesonide 0.5 MG/2ML nebulizer solution Commonly known as: PULMICORT Take 0.5 mg by nebulization every 12 (twelve) hours.   budesonide-formoterol 160-4.5 MCG/ACT inhaler Commonly known as: Symbicort Inhale 2 puffs into the lungs 2 (two) times daily.   busPIRone 15 MG tablet Commonly known as: BUSPAR Take 15 mg by mouth 2 (two) times daily.   clopidogrel 75 MG tablet Commonly known as: PLAVIX Take 75 mg by mouth daily.   doxycycline 100 MG tablet Commonly known as: VIBRA-TABS Take 1 tablet (100 mg total) by mouth every 12 (twelve) hours.   fluticasone 50 MCG/ACT nasal spray Commonly known as: FLONASE Place 2 sprays into both nostrils daily. What changed:   when to take this  reasons to take this   furosemide 40 MG tablet Commonly known as: LASIX Take 1.5 tablets (60 mg total) by mouth 2 (two) times daily. What changed:   medication strength  how much to take  when to take this   gabapentin 300 MG capsule Commonly known as: NEURONTIN Take 300 mg by mouth at bedtime.   isosorbide mononitrate 60 MG 24 hr tablet Commonly known as: IMDUR Take 1 tablet (60 mg total) by mouth daily. Start taking on: Jul 05, 2019 What changed:   medication strength  how much to take   Lantus SoloStar 100 UNIT/ML Solostar Pen Generic drug: insulin glargine Inject 60 Units into the skin 2 (two) times daily.   metoprolol succinate 50 MG 24  hr tablet Commonly known as: TOPROL-XL Take 100 mg by mouth daily. Take with or immediately following a meal.   montelukast 4 MG chewable tablet Commonly known as: SINGULAIR Chew 4 mg by mouth every evening.   nitroGLYCERIN 0.4 MG SL tablet Commonly known as: NITROSTAT Place 1 tablet (0.4 mg total) under the tongue every 5 (five) minutes as needed for chest pain.   NovoLOG FlexPen 100 UNIT/ML FlexPen Generic drug: insulin aspart Inject 30 Units into the skin 3 (three) times daily with  meals.   nystatin powder Commonly known as: MYCOSTATIN/NYSTOP Apply 1 application topically See admin instructions. Apply under the breasts 2 times a day   oxyCODONE 5 MG immediate release tablet Commonly known as: Oxy IR/ROXICODONE Take 1 tablet (5 mg total) by mouth every 6 (six) hours as needed for up to 5 days for moderate pain or severe pain.   OXYGEN Inhale 2 L/min into the lungs continuous.   pantoprazole 20 MG tablet Commonly known as: PROTONIX Take 20 mg by mouth daily before breakfast.   spironolactone 25 MG tablet Commonly known as: ALDACTONE Take 0.5 tablets (12.5 mg total) by mouth daily.   tiZANidine 2 MG tablet Commonly known as: ZANAFLEX Take 2 mg by mouth every evening.   traZODone 100 MG tablet Commonly known as: DESYREL Take 100 mg by mouth at bedtime.            Durable Medical Equipment  (From admission, onward)         Start     Ordered   06/30/19 1123  For home use only DME 3 n 1  Once     06/30/19 1122          Disposition and follow-up:   Ms.Christine Sims was discharged from [facility] in Stable condition.  At the hospital follow up visit please address:  1.  Please consider restarting blood pressure medications.  Patient was discharged with instructions to continue to hold hydralazine and doxazosin.  See problem "hypertension" below. 2.  Please assess patient for continued chest pain 3.  Please assess volume status and check renal function, adjust furosemide dosage as indicated 4.  Please ensure patient has cardiology follow-up 5.  Please check wound on right leg to evaluate for healing, infection. Patient discharged to complete 5 day course of doxycycline  2.  Labs / imaging needed at time of follow-up: CBC, BMP  3.  Pending labs/ test needing follow-up: None  Follow-up Appointments: [place], [facility] Follow up.   Specialty: Home Health Services Why: [facility],  HHPT, [place] Contact information: [place] [address] [phone]        Christine Glee., MD Follow up.   Specialty: Internal Medicine Why: Please call for a followup appointment to occur within the next week Contact information: [address] [place] [address] [place] by problem list:  # Acute on chronic systolic heart failure # Ischemic cardiomyopathy Patient is a 64 year old female with past medical history significant for HFrEF (EF 30-35%), coronary artery disease status post stent placement, COPD, asthma, hypertension, and depression who presented on 06/26/2019 with a 4-day history of worsening shortness of breath.  TTE from April 17, 2019 revealed EF 30-35%. Patient volume overloaded on exam with increased oxygen requirement of 5 L [place] (from 3 L [place] at home). Patient  symptoms occurred in the setting of dietary indiscretion, missed doses of Lasix.  Patient was on outpatient regimen of Lasix 80 mg p.o. twice daily.  Patient was initially diuresed on regimen of Lasix 40 mg IV 3 times daily which was increased to 60 mg IV 3 times daily when patient was receiving fluid from heparin transfusion (see below).  With significant diuresis, increasing creatinine, Lasix was decreased to 60 mg IV twice daily and then transitioned to 60 mg p.o. twice daily.  Patient had a net output of 8.6 L during admission, had body mass of 114.4 kg on day of discharge.  # CKD Patient with creatinine 1.75 on presentation, CKD likely secondary to T2DM and HTN. Creatinine trend which up-trended during diuresis: 1.75 -> 1.77 -> 1.94 -> 1.82 -> 1.71 -> 1.92 -> 2.05 -> 2.26 -> 2.44 -> 2.29. Recommend repeat BMP at PCP followup.  # Atypical angina # CAD s/p mid LAD stent, D1 PTCA On evening of 4/24-4/25 patient experienced substernal chest pain, relieved by nitroglycerin, EKG revealed ST depression in leads I, II, aVL, V5, and V6, ST  elevation in aVR.  Troponins, uptrending.  Patient's last heart cath at [facility] in August 2020, demonstrated LAD with diffuse noncritical calcific disease with patent stent, D1 proximal with 95% stenosis and 50% distal stenosis of circumflex artery.  During this admission, patient was placed on heparin drip, and cardiology consulted.  Cardiology recommended continued observation, caution regarding heart cath given patient's CKD.  Patient completed 72 hours of heparin therapy, and troponins peaked and downtrended. Troponin trend:125 -> 424 -> 720 -> 811 -> 533. Patient reported intermittent chest pain on AM of 07/03/19 and repeat EKG was without significant change, troponin's were 91 -> 87.  Patient was discharged with as needed Nitrostat and an increased dose of Imdur (60 mg daily from 30 mg daily).  # Right lower extremity cellulitis # Right leg pain Patient with wound on right lower extremity, see photos from 4/22 and 4/27.  On presentation, no purulence or evidence for infection.  With increased reported pain, wound reexamined on 4/27 and purulent drainage noted. Patient started on doxycycline 1 mg twice daily for 5-day course.  Patient was discharged with oxycodone for pain and doxycycline to complete total 5-day course.  # COPD # Acute on chronic hypoxic respiratory failure: Patient shortness of breath on presentation was likely secondary to volume overload. It was initially thought that there may be a component of COPD exacerbation. There is no increase in cough or sputum production. Patient received 2 days of prednisone. This therapy was stopped as symptoms were attributed to HFrEF exacerbation.  # Hypertension Patient on home blood pressure regimen of amlodipine 10 mg daily + metoprolol (Toprol-XL) 100 mg daily + hydralazine 25 mg every 8 hours + doxazosin 2 mg daily. Blood pressures were low-normal on admission and oral agents were held.  Amlodipine was restarted during admission, and at  discharge patient was instructed to take amlodipine+metoprolol, hold hydralazine and doxazosin until hospital follow-up appointment with PCP.  Discharge Vitals:   BP (!) 142/71 (BP Location: Left Wrist)    Pulse 97    Temp 98 F (36.7 C) (Oral)    Resp 18    Ht _0  (1.626 m)    Wt 114.4 kg    SpO2 97%    BMI 43.31 kg/m   Pertinent Labs, Studies, and Procedures:  CBC Latest Ref Rng & Units 07/04/2019 07/03/2019 07/02/2019  WBC 4.0 - 10.5 K/uL 7.9  7.6 7.2  Hemoglobin 12.0 - 15.0 g/dL 10.2(L) 10.1(L) 9.1(L)  Hematocrit 36.0 - 46.0 % 34.3(L) 33.6(L) 30.4(L)  Platelets 150 - 400 K/uL 308 294 297   CMP Latest Ref Rng & Units 07/04/2019 07/03/2019 07/02/2019  Glucose 70 - 99 mg/dL 89 75 260(H)  BUN 8 - 23 mg/dL 73(H) 59(H) 51(H)  Creatinine 0.44 - 1.00 mg/dL 2.29(H) 2.44(H) 2.26(H)  Sodium 135 - 145 mmol/L 136 139 136  Potassium 3.5 - 5.1 mmol/L 4.7 4.6 4.5  Chloride 98 - 111 mmol/L 94(L) 97(L) 92(L)  CO2 22 - 32 mmol/L 26 32 30  Calcium 8.9 - 10.3 mg/dL 9.5 9.5 9.4  Total Protein 6.5 - 8.1 g/dL - - -  Total Bilirubin 0.3 - 1.2 mg/dL - - -  Alkaline Phos 38 - 126 U/L - - -  AST 15 - 41 U/L - - -  ALT 0 - 44 U/L - - -   DG Chest Port 1 View (06/26/19): FINDINGS: Unchanged cardiomegaly. Normal pulmonary vascularity. Improved aeration at the lung bases. No focal consolidation, pleural effusion, or pneumothorax. No acute osseous abnormality. IMPRESSION: No active disease.  Discharge Instructions: Discharge Instructions    Call MD for:  difficulty breathing, headache or visual disturbances   Complete by: As directed    Call MD for:  persistant dizziness or light-headedness   Complete by: As directed    Call MD for:  severe uncontrolled pain   Complete by: As directed    Diet - low sodium heart healthy   Complete by: As directed    Increase activity slowly   Complete by: As directed      Signed: Jeanmarie Hubert, MD 07/04/2019, 12:14 PM

## 2019-07-04 NOTE — Plan of Care (Signed)
Problem: Education: Goal: Knowledge of General Education information will improve Description: Including pain rating scale, medication(s)/side effects and non-pharmacologic comfort measures 07/04/2019 1234 by Rolm Baptise, RN Outcome: Adequate for Discharge 07/04/2019 1154 by Rolm Baptise, RN Outcome: Adequate for Discharge   Problem: Health Behavior/Discharge Planning: Goal: Ability to manage health-related needs will improve 07/04/2019 1234 by Rolm Baptise, RN Outcome: Adequate for Discharge 07/04/2019 1154 by Rolm Baptise, RN Outcome: Adequate for Discharge   Problem: Clinical Measurements: Goal: Ability to maintain clinical measurements within normal limits will improve 07/04/2019 1234 by Rolm Baptise, RN Outcome: Adequate for Discharge 07/04/2019 1154 by Rolm Baptise, RN Outcome: Adequate for Discharge Goal: Will remain free from infection 07/04/2019 1234 by Rolm Baptise, RN Outcome: Adequate for Discharge 07/04/2019 1154 by Rolm Baptise, RN Outcome: Adequate for Discharge Goal: Diagnostic test results will improve 07/04/2019 1234 by Rolm Baptise, RN Outcome: Adequate for Discharge 07/04/2019 1154 by Rolm Baptise, RN Outcome: Adequate for Discharge Goal: Respiratory complications will improve 07/04/2019 1234 by Rolm Baptise, RN Outcome: Adequate for Discharge 07/04/2019 1154 by Rolm Baptise, RN Outcome: Adequate for Discharge Goal: Cardiovascular complication will be avoided 07/04/2019 1234 by Rolm Baptise, RN Outcome: Adequate for Discharge 07/04/2019 1154 by Rolm Baptise, RN Outcome: Adequate for Discharge   Problem: Activity: Goal: Risk for activity intolerance will decrease 07/04/2019 1234 by Rolm Baptise, RN Outcome: Adequate for Discharge 07/04/2019 1154 by Rolm Baptise, RN Outcome: Adequate for Discharge   Problem: Nutrition: Goal: Adequate nutrition will be maintained 07/04/2019 1234 by Rolm Baptise, RN Outcome:  Adequate for Discharge 07/04/2019 1154 by Rolm Baptise, RN Outcome: Adequate for Discharge   Problem: Coping: Goal: Level of anxiety will decrease 07/04/2019 1234 by Rolm Baptise, RN Outcome: Adequate for Discharge 07/04/2019 1154 by Rolm Baptise, RN Outcome: Adequate for Discharge   Problem: Elimination: Goal: Will not experience complications related to bowel motility 07/04/2019 1234 by Rolm Baptise, RN Outcome: Adequate for Discharge 07/04/2019 1154 by Rolm Baptise, RN Outcome: Adequate for Discharge Goal: Will not experience complications related to urinary retention 07/04/2019 1234 by Rolm Baptise, RN Outcome: Adequate for Discharge 07/04/2019 1154 by Rolm Baptise, RN Outcome: Adequate for Discharge   Problem: Pain Managment: Goal: General experience of comfort will improve 07/04/2019 1234 by Rolm Baptise, RN Outcome: Adequate for Discharge 07/04/2019 1154 by Rolm Baptise, RN Outcome: Adequate for Discharge   Problem: Safety: Goal: Ability to remain free from injury will improve 07/04/2019 1234 by Rolm Baptise, RN Outcome: Adequate for Discharge 07/04/2019 1154 by Rolm Baptise, RN Outcome: Adequate for Discharge   Problem: Skin Integrity: Goal: Risk for impaired skin integrity will decrease 07/04/2019 1234 by Rolm Baptise, RN Outcome: Adequate for Discharge 07/04/2019 1154 by Rolm Baptise, RN Outcome: Adequate for Discharge   Problem: Education: Goal: Ability to demonstrate management of disease process will improve 07/04/2019 1234 by Rolm Baptise, RN Outcome: Adequate for Discharge 07/04/2019 1154 by Rolm Baptise, RN Outcome: Adequate for Discharge Goal: Ability to verbalize understanding of medication therapies will improve 07/04/2019 1234 by Rolm Baptise, RN Outcome: Adequate for Discharge 07/04/2019 1154 by Rolm Baptise, RN Outcome: Adequate for Discharge Goal: Individualized Educational Video(s) 07/04/2019 1234 by  Rolm Baptise, RN Outcome: Adequate for Discharge 07/04/2019 1154 by Rolm Baptise, RN Outcome: Adequate for Discharge   Problem: Activity: Goal: Capacity to carry out  activities will improve 07/04/2019 1234 by Rolm Baptise, RN Outcome: Adequate for Discharge 07/04/2019 1154 by Rolm Baptise, RN Outcome: Adequate for Discharge   Problem: Cardiac: Goal: Ability to achieve and maintain adequate cardiopulmonary perfusion will improve 07/04/2019 1234 by Rolm Baptise, RN Outcome: Adequate for Discharge 07/04/2019 1154 by Rolm Baptise, RN Outcome: Adequate for Discharge

## 2019-07-04 NOTE — TOC Transition Note (Signed)
Transition of Care [facility]) - CM/SW Discharge Note   Patient Details  Name: Christine Sims MRN: [id number] Date of Birth: 1955-09-09  [facility] of [facility]) CM/SW Contact:  Zenon Mayo, RN Phone Number: 07/04/2019, 12:09 PM   Clinical Narrative:    Patient for dc today, NCM notified [name] with [facility], patient states she does not want the 3 n 1.  Her ride will bring her oxygen for her to go home with. TOC bringing her meds to room prior to dc.   Final next level of care: [place] Barriers to Discharge: No Barriers Identified   Patient Goals and CMS Choice Patient states their goals for this hospitalization and ongoing recovery are:: get better CMS Medicare.gov Compare Post Acute Care list provided to:: Patient Choice offered to / list presented to : Patient  Discharge Placement                       Discharge Plan and Services   Discharge Planning Services: CM Consult Post Acute Care Choice: Home Health            DME Agency: NA       HH Arranged: RN, PT, OT, Disease Management [place] Agency: [facility] Date [place]: 06/27/19 Time [place]: [id number] Representative spoke with at [place]: [place] ([place]) Interventions     Readmission Risk Interventions Readmission Risk Prevention Plan 06/27/2019 05/13/2019  Transportation Screening Complete Complete  PCP or Specialist Appt within 3-5 Days - Complete  HRI or [place] Complete Complete  Social Work Consult for [place] Planning/Counseling Complete Complete  Palliative Care Screening Not Applicable Not Applicable  Medication Review Press photographer) Complete Complete  Some recent data might be hidden

## 2019-07-04 NOTE — Progress Notes (Signed)
Progress Note  Patient Name: Christine Sims Date of Encounter: 07/04/2019  Primary Cardiologist: Loma Boston   Subjective   64 year old female who was admitted with acute on chronic systolic congestive heart failure. Obesity,  COPD, HTN depression  She ran out of her furosemide for several days and ate lots of fast food over the past week.  She gained approximately 20 pounds.  She has diuresed 8.7 liters so far this admission Venous duplex scan shows no evidence of DVT .  Creatinine is 2.29 today     Inpatient Medications    Scheduled Meds: . amitriptyline  25 mg Oral QHS  . amLODipine  10 mg Oral Daily  . atorvastatin  80 mg Oral Daily  . busPIRone  15 mg Oral BID  . clopidogrel  75 mg Oral Daily  . doxycycline  100 mg Oral Q12H  . enoxaparin (LOVENOX) injection  30 mg Subcutaneous Daily  . furosemide  60 mg Oral BID  . gabapentin  600 mg Oral QHS  . insulin aspart  0-20 Units Subcutaneous TID WC  . insulin aspart  0-5 Units Subcutaneous QHS  . insulin aspart  30 Units Subcutaneous TID WC  . insulin glargine  60 Units Subcutaneous BID  . isosorbide mononitrate  60 mg Oral Daily  . mometasone-formoterol  2 puff Inhalation BID  . pantoprazole  20 mg Oral Daily  . sodium chloride flush  3 mL Intravenous Q12H  . spironolactone  12.5 mg Oral Daily   Continuous Infusions: . sodium chloride Stopped (06/28/19 0913)   PRN Meds: sodium chloride, acetaminophen, ipratropium-albuterol, nitroGLYCERIN, ondansetron (ZOFRAN) IV, oxyCODONE, polyethylene glycol, sodium chloride flush, traZODone   Vital Signs    Vitals:   07/03/19 2124 07/04/19 0627 07/04/19 0637 07/04/19 0719  BP: 139/70 (!) 142/71    Pulse: 95 97    Resp: 19 18    Temp: 98.4 F (36.9 C) 98 F (36.7 C)    TempSrc: Oral Oral    SpO2: 100% 95%  97%  Weight:   114.4 kg   Height:        Intake/Output Summary (Last 24 hours) at 07/04/2019 0941 Last data filed at 07/04/2019 0827 Gross per 24 hour    Intake 585 ml  Output 2175 ml  Net -1590 ml   Last 3 Weights 07/04/2019 07/03/2019 07/02/2019  Weight (lbs) 252 lb 4.8 oz 253 lb 6.4 oz 256 lb 9.6 oz  Weight (kg) 114.443 kg 114.941 kg 116.393 kg      Telemetry   NSR   - Personally Reviewed  ECG     - Personally Reviewed  Physical Exam   Physical Exam: Blood pressure (!) 142/71, pulse 97, temperature 98 F (36.7 C), temperature source Oral, resp. rate 18, height 5\' 4"  (1.626 m), weight 114.4 kg, SpO2 97 %.  GEN:  Obese, middle age female,   HEENT: Normal NECK: No JVD; No carotid bruits LYMPHATICS: No lymphadenopathy CARDIAC:  RR  RESPIRATORY:  Clear to auscultation without rales, wheezing or rhonchi  ABDOMEN: Soft, non-tender, non-distended MUSCULOSKELETAL:  1+ leg edema,   Legs are wrapped.  SKIN: Warm and dry NEUROLOGIC:  Alert and oriented x 3   Labs    High Sensitivity Troponin:   Recent Labs  Lab 06/29/19 1304 06/29/19 1858 06/30/19 0745 07/03/19 1112 07/03/19 1349  TROPONINIHS 720* 811* 533* 91* 87*      Chemistry Recent Labs  Lab 07/02/19 0039 07/03/19 0344 07/04/19 0447  NA 136 139 136  K  4.5 4.6 4.7  CL 92* 97* 94*  CO2 30 32 26  GLUCOSE 260* 75 89  BUN 51* 59* 73*  CREATININE 2.26* 2.44* 2.29*  CALCIUM 9.4 9.5 9.5  GFRNONAA 23* 21* 22*  GFRAA 26* 24* 26*  ANIONGAP 14 10 16*     Hematology Recent Labs  Lab 07/02/19 0039 07/03/19 0344 07/04/19 0447  WBC 7.2 7.6 7.9  RBC 3.58* 3.92 4.00  HGB 9.1* 10.1* 10.2*  HCT 30.4* 33.6* 34.3*  MCV 84.9 85.7 85.8  MCH 25.4* 25.8* 25.5*  MCHC 29.9* 30.1 29.7*  RDW 16.7* 16.9* 17.0*  PLT 297 294 308    BNP No results for input(s): BNP, PROBNP in the last 168 hours.   DDimer No results for input(s): DDIMER in the last 168 hours.   Radiology    VAS [place] LOWER EXTREMITY VENOUS (DVT)  Result Date: 07/03/2019  Lower Venous DVTStudy Indications: Pain.  Limitations: Body habitus and poor ultrasound/tissue interface. Comparison Study: no prior  Performing Technologist: Abram Sander RVS  Examination Guidelines: A complete evaluation includes B-mode imaging, spectral Doppler, color Doppler, and power Doppler as needed of all accessible portions of each vessel. Bilateral testing is considered an integral part of a complete examination. Limited examinations for reoccurring indications may be performed as noted. The reflux portion of the exam is performed with the patient in reverse Trendelenburg.  +---------+---------------+---------+-----------+----------+------------------+ RIGHT    CompressibilityPhasicitySpontaneityPropertiesThrombus Aging     +---------+---------------+---------+-----------+----------+------------------+ CFV      Full           Yes      Yes                                     +---------+---------------+---------+-----------+----------+------------------+ SFJ      Full                                                            +---------+---------------+---------+-----------+----------+------------------+ FV Prox  Full                                                            +---------+---------------+---------+-----------+----------+------------------+ FV Mid   Full                                                            +---------+---------------+---------+-----------+----------+------------------+ FV DistalFull                                                            +---------+---------------+---------+-----------+----------+------------------+ PFV      Full                                                            +---------+---------------+---------+-----------+----------+------------------+  POP      Full           Yes      Yes                                     +---------+---------------+---------+-----------+----------+------------------+ PTV      Full                                         limited                                                                   visualization      +---------+---------------+---------+-----------+----------+------------------+ PERO                                                  Not visualized     +---------+---------------+---------+-----------+----------+------------------+   +----+---------------+---------+-----------+----------+--------------+ LEFTCompressibilityPhasicitySpontaneityPropertiesThrombus Aging +----+---------------+---------+-----------+----------+--------------+ CFV                                              Not visualized +----+---------------+---------+-----------+----------+--------------+     Summary: RIGHT: - There is no evidence of deep vein thrombosis in the lower extremity.  - No cystic structure found in the popliteal fossa.   *See table(s) above for measurements and observations. Electronically signed by Curt Jews MD on 07/03/2019 at 4:27:58 PM.    Final     Cardiac Studies     Patient Profile     64 y.o. female with acute on chronic combined CHF, obesity, HTN,   Assessment & Plan    1.  Acute on chronic combined systolic and diastolic congestive heart failure:   she is doing well.  Has continued to diurese on daily lasix    2.  Coronary artery disease: She denies having any episodes of angina.  3.  Chronic renal insufficiency: Creatinine seems to have stabilized.  4.  Morbid obesity: Advised weight loss.  For questions or updates, please contact [place] Please consult [email or web address] for contact info under        Signed, Mertie Moores, MD  07/04/2019, 9:41 AM

## 2019-07-29 MED ORDER — FLUTICASONE PROPIONATE 50 MCG/ACT NA SUSP
2.00 | NASAL | Status: DC
Start: 2019-08-01 — End: 2019-07-29

## 2019-07-29 MED ORDER — INSULIN GLARGINE 100 UNIT/ML ~~LOC~~ SOLN
70.00 | SUBCUTANEOUS | Status: DC
Start: 2019-07-31 — End: 2019-07-29

## 2019-07-29 MED ORDER — LINEZOLID 600 MG PO TABS
600.00 | ORAL_TABLET | ORAL | Status: DC
Start: 2019-07-29 — End: 2019-07-29

## 2019-07-29 MED ORDER — POTASSIUM CHLORIDE CRYS ER 20 MEQ PO TBCR
40.00 | EXTENDED_RELEASE_TABLET | ORAL | Status: DC
Start: 2019-07-30 — End: 2019-07-29

## 2019-07-29 MED ORDER — AMLODIPINE BESYLATE 5 MG PO TABS
10.00 | ORAL_TABLET | ORAL | Status: DC
Start: 2019-07-30 — End: 2019-07-29

## 2019-07-29 MED ORDER — INSULIN LISPRO 100 UNIT/ML ~~LOC~~ SOLN
25.00 | SUBCUTANEOUS | Status: DC
Start: 2019-07-31 — End: 2019-07-29

## 2019-07-29 MED ORDER — GLUCAGON (RDNA) 1 MG IJ KIT
1.00 | PACK | INTRAMUSCULAR | Status: DC
Start: ? — End: 2019-07-29

## 2019-07-29 MED ORDER — SPIRONOLACTONE 25 MG PO TABS
12.50 | ORAL_TABLET | ORAL | Status: DC
Start: 2019-07-30 — End: 2019-07-29

## 2019-07-29 MED ORDER — GENERIC EXTERNAL MEDICATION
Status: DC
Start: ? — End: 2019-07-29

## 2019-07-29 MED ORDER — COLLAGENASE 250 UNIT/GM EX OINT
TOPICAL_OINTMENT | CUTANEOUS | Status: DC
Start: 2019-08-01 — End: 2019-07-29

## 2019-07-29 MED ORDER — BENZOCAINE-MENTHOL 6-10 MG MT LOZG
1.00 | LOZENGE | OROMUCOSAL | Status: DC
Start: ? — End: 2019-07-29

## 2019-07-29 MED ORDER — PANTOPRAZOLE SODIUM 40 MG PO TBEC
40.00 | DELAYED_RELEASE_TABLET | ORAL | Status: DC
Start: 2019-08-01 — End: 2019-07-29

## 2019-07-29 MED ORDER — DEXTROSE 10 % IV SOLN
125.00 | INTRAVENOUS | Status: DC
Start: ? — End: 2019-07-29

## 2019-07-29 MED ORDER — MONTELUKAST SODIUM 4 MG PO CHEW
4.00 | CHEWABLE_TABLET | ORAL | Status: DC
Start: 2019-07-31 — End: 2019-07-29

## 2019-07-29 MED ORDER — HYDRALAZINE HCL 50 MG PO TABS
50.00 | ORAL_TABLET | ORAL | Status: DC
Start: 2019-07-29 — End: 2019-07-29

## 2019-07-29 MED ORDER — TRAZODONE HCL 100 MG PO TABS
100.00 | ORAL_TABLET | ORAL | Status: DC
Start: 2019-07-31 — End: 2019-07-29

## 2019-07-29 MED ORDER — ISOSORBIDE MONONITRATE ER 60 MG PO TB24
60.00 | ORAL_TABLET | ORAL | Status: DC
Start: 2019-08-01 — End: 2019-07-29

## 2019-07-29 MED ORDER — ONDANSETRON HCL 4 MG/2ML IJ SOLN
4.00 | INTRAMUSCULAR | Status: DC
Start: ? — End: 2019-07-29

## 2019-07-29 MED ORDER — ASPIRIN 81 MG PO TBEC
81.00 | DELAYED_RELEASE_TABLET | ORAL | Status: DC
Start: 2019-08-01 — End: 2019-07-29

## 2019-07-29 MED ORDER — CLOPIDOGREL BISULFATE 75 MG PO TABS
75.00 | ORAL_TABLET | ORAL | Status: DC
Start: 2019-08-01 — End: 2019-07-29

## 2019-07-29 MED ORDER — CIPROFLOXACIN HCL 500 MG PO TABS
500.00 | ORAL_TABLET | ORAL | Status: DC
Start: 2019-07-29 — End: 2019-07-29

## 2019-07-29 MED ORDER — GLUCOSE 40 % PO GEL
15.00 | ORAL | Status: DC
Start: ? — End: 2019-07-29

## 2019-07-29 MED ORDER — FLUTICASONE FUROATE-VILANTEROL 100-25 MCG/INH IN AEPB
1.00 | INHALATION_SPRAY | RESPIRATORY_TRACT | Status: DC
Start: 2019-08-01 — End: 2019-07-29

## 2019-07-29 MED ORDER — ACETAMINOPHEN 500 MG PO TABS
1000.00 | ORAL_TABLET | ORAL | Status: DC
Start: ? — End: 2019-07-29

## 2019-07-29 MED ORDER — METOPROLOL SUCCINATE ER 25 MG PO TB24
25.00 | ORAL_TABLET | ORAL | Status: DC
Start: 2019-07-30 — End: 2019-07-29

## 2019-07-29 MED ORDER — PHENOL 1.4 % MT LIQD
1.00 | OROMUCOSAL | Status: DC
Start: ? — End: 2019-07-29

## 2019-07-29 MED ORDER — BUDESONIDE 0.5 MG/2ML IN SUSP
0.50 | RESPIRATORY_TRACT | Status: DC
Start: 2019-07-31 — End: 2019-07-29

## 2019-07-29 MED ORDER — TERAZOSIN HCL 1 MG PO CAPS
2.00 | ORAL_CAPSULE | ORAL | Status: DC
Start: 2019-07-29 — End: 2019-07-29

## 2019-07-29 MED ORDER — GENERIC EXTERNAL MEDICATION
Status: DC
Start: 2019-07-31 — End: 2019-07-29

## 2019-07-29 MED ORDER — FUROSEMIDE 10 MG/ML IJ SOLN
40.00 | INTRAMUSCULAR | Status: DC
Start: 2019-07-29 — End: 2019-07-29

## 2019-07-29 MED ORDER — DICLOFENAC SODIUM 1 % EX GEL
2.00 | CUTANEOUS | Status: DC
Start: 2019-07-29 — End: 2019-07-29

## 2019-07-29 MED ORDER — NITROGLYCERIN 0.4 MG SL SUBL
0.40 | SUBLINGUAL_TABLET | SUBLINGUAL | Status: DC
Start: ? — End: 2019-07-29

## 2019-07-29 MED ORDER — OXYCODONE-ACETAMINOPHEN 5-325 MG PO TABS
1.00 | ORAL_TABLET | ORAL | Status: DC
Start: ? — End: 2019-07-29

## 2019-07-29 MED ORDER — ENOXAPARIN SODIUM 30 MG/0.3ML ~~LOC~~ SOLN
30.00 | SUBCUTANEOUS | Status: DC
Start: 2019-07-29 — End: 2019-07-29

## 2019-07-29 MED ORDER — ATORVASTATIN CALCIUM 40 MG PO TABS
80.00 | ORAL_TABLET | ORAL | Status: DC
Start: 2019-07-31 — End: 2019-07-29

## 2019-07-29 MED ORDER — METHYLPREDNISOLONE SODIUM SUCC 125 MG IJ SOLR
60.00 | INTRAMUSCULAR | Status: DC
Start: 2019-07-29 — End: 2019-07-29

## 2019-07-29 MED ORDER — POLYETHYLENE GLYCOL 3350 17 GM/SCOOP PO POWD
17.00 | ORAL | Status: DC
Start: ? — End: 2019-07-29

## 2019-07-29 MED ORDER — PROCHLORPERAZINE EDISYLATE 10 MG/2ML IJ SOLN
5.00 | INTRAMUSCULAR | Status: DC
Start: 2019-07-31 — End: 2019-07-29

## 2019-07-29 MED ORDER — INSULIN LISPRO 100 UNIT/ML ~~LOC~~ SOLN
3.00 | SUBCUTANEOUS | Status: DC
Start: 2019-07-31 — End: 2019-07-29

## 2019-07-31 MED ORDER — HYDRALAZINE HCL 50 MG PO TABS
100.00 | ORAL_TABLET | ORAL | Status: DC
Start: 2019-07-31 — End: 2019-07-31

## 2019-07-31 MED ORDER — LABETALOL HCL 100 MG PO TABS
100.00 | ORAL_TABLET | ORAL | Status: DC
Start: 2019-07-31 — End: 2019-07-31

## 2019-07-31 MED ORDER — LINEZOLID 600 MG PO TABS
600.00 | ORAL_TABLET | ORAL | Status: DC
Start: 2019-07-31 — End: 2019-07-31

## 2019-07-31 MED ORDER — PREDNISONE 20 MG PO TABS
40.00 | ORAL_TABLET | ORAL | Status: DC
Start: 2019-08-01 — End: 2019-07-31

## 2019-07-31 MED ORDER — ONDANSETRON 4 MG PO TBDP
4.00 | ORAL_TABLET | ORAL | Status: DC
Start: ? — End: 2019-07-31

## 2019-07-31 MED ORDER — HEPARIN SODIUM (PORCINE) 5000 UNIT/ML IJ SOLN
5000.00 | INTRAMUSCULAR | Status: DC
Start: 2019-07-31 — End: 2019-07-31

## 2019-07-31 MED ORDER — CIPROFLOXACIN HCL 500 MG PO TABS
500.00 | ORAL_TABLET | ORAL | Status: DC
Start: 2019-07-31 — End: 2019-07-31

## 2019-08-05 ENCOUNTER — Ambulatory Visit: Payer: Medicaid Other | Admitting: Cardiovascular Disease

## 2019-09-13 ENCOUNTER — Emergency Department (HOSPITAL_COMMUNITY)
Admission: EM | Admit: 2019-09-13 | Discharge: 2019-09-13 | Disposition: A | Discharge status: Other Acute Inpatient Hospital | Payer: Medicaid Other | Attending: Emergency Medicine | Admitting: Emergency Medicine

## 2019-09-13 ENCOUNTER — Encounter (HOSPITAL_COMMUNITY): Payer: Self-pay

## 2019-09-13 ENCOUNTER — Emergency Department (HOSPITAL_COMMUNITY): Payer: Medicaid Other

## 2019-09-13 DIAGNOSIS — J449 Chronic obstructive pulmonary disease, unspecified: Secondary | ICD-10-CM | POA: Insufficient documentation

## 2019-09-13 DIAGNOSIS — J9620 Acute and chronic respiratory failure, unspecified whether with hypoxia or hypercapnia: Secondary | ICD-10-CM | POA: Diagnosis not present

## 2019-09-13 DIAGNOSIS — Z79899 Other long term (current) drug therapy: Secondary | ICD-10-CM | POA: Diagnosis not present

## 2019-09-13 DIAGNOSIS — Z87891 Personal history of nicotine dependence: Secondary | ICD-10-CM | POA: Diagnosis not present

## 2019-09-13 DIAGNOSIS — I130 Hypertensive heart and chronic kidney disease with heart failure and stage 1 through stage 4 chronic kidney disease, or unspecified chronic kidney disease: Secondary | ICD-10-CM | POA: Insufficient documentation

## 2019-09-13 DIAGNOSIS — I5042 Chronic combined systolic (congestive) and diastolic (congestive) heart failure: Secondary | ICD-10-CM | POA: Insufficient documentation

## 2019-09-13 DIAGNOSIS — N183 Chronic kidney disease, stage 3 unspecified: Secondary | ICD-10-CM | POA: Insufficient documentation

## 2019-09-13 DIAGNOSIS — Z7982 Long term (current) use of aspirin: Secondary | ICD-10-CM | POA: Insufficient documentation

## 2019-09-13 DIAGNOSIS — E1122 Type 2 diabetes mellitus with diabetic chronic kidney disease: Secondary | ICD-10-CM | POA: Insufficient documentation

## 2019-09-13 DIAGNOSIS — Z20822 Contact with and (suspected) exposure to covid-19: Secondary | ICD-10-CM | POA: Diagnosis not present

## 2019-09-13 DIAGNOSIS — J9621 Acute and chronic respiratory failure with hypoxia: Secondary | ICD-10-CM

## 2019-09-13 DIAGNOSIS — Z794 Long term (current) use of insulin: Secondary | ICD-10-CM | POA: Diagnosis not present

## 2019-09-13 DIAGNOSIS — L89159 Pressure ulcer of sacral region, unspecified stage: Secondary | ICD-10-CM | POA: Insufficient documentation

## 2019-09-13 DIAGNOSIS — R0602 Shortness of breath: Secondary | ICD-10-CM | POA: Diagnosis present

## 2019-09-13 LAB — COMPREHENSIVE METABOLIC PANEL
ALT: 19 U/L (ref 0–44)
AST: 10 U/L — ABNORMAL LOW (ref 15–41)
Albumin: 2.6 g/dL — ABNORMAL LOW (ref 3.5–5.0)
Alkaline Phosphatase: 76 U/L (ref 38–126)
Anion gap: 17 — ABNORMAL HIGH (ref 5–15)
BUN: 38 mg/dL — ABNORMAL HIGH (ref 8–23)
CO2: 24 mmol/L (ref 22–32)
Calcium: 9.2 mg/dL (ref 8.9–10.3)
Chloride: 91 mmol/L — ABNORMAL LOW (ref 98–111)
Creatinine, Ser: 2.18 mg/dL — ABNORMAL HIGH (ref 0.44–1.00)
GFR calc Af Amer: 27 mL/min — ABNORMAL LOW (ref >60)
GFR calc non Af Amer: 24 mL/min — ABNORMAL LOW (ref >60)
Glucose, Bld: 564 mg/dL (ref 70–99)
Potassium: 4.4 mmol/L (ref 3.5–5.1)
Sodium: 132 mmol/L — ABNORMAL LOW (ref 135–145)
Total Bilirubin: 1.2 mg/dL (ref 0.3–1.2)
Total Protein: 7.8 g/dL (ref 6.5–8.1)

## 2019-09-13 LAB — CBC WITH DIFFERENTIAL/PLATELET
Abs Immature Granulocytes: 0.06 10*3/uL (ref 0.00–0.07)
Basophils Absolute: 0 10*3/uL (ref 0.0–0.1)
Basophils Relative: 0 %
Eosinophils Absolute: 0 10*3/uL (ref 0.0–0.5)
Eosinophils Relative: 0 %
HCT: 26.7 % — ABNORMAL LOW (ref 36.0–46.0)
Hemoglobin: 7.7 g/dL — ABNORMAL LOW (ref 12.0–15.0)
Immature Granulocytes: 1 %
Lymphocytes Relative: 10 %
Lymphs Abs: 1 10*3/uL (ref 0.7–4.0)
MCH: 24.7 pg — ABNORMAL LOW (ref 26.0–34.0)
MCHC: 28.8 g/dL — ABNORMAL LOW (ref 30.0–36.0)
MCV: 85.6 fL (ref 80.0–100.0)
Monocytes Absolute: 0.7 10*3/uL (ref 0.1–1.0)
Monocytes Relative: 7 %
Neutro Abs: 8.6 10*3/uL — ABNORMAL HIGH (ref 1.7–7.7)
Neutrophils Relative %: 82 %
Platelets: 344 10*3/uL (ref 150–400)
RBC: 3.12 MIL/uL — ABNORMAL LOW (ref 3.87–5.11)
RDW: 17.7 % — ABNORMAL HIGH (ref 11.5–15.5)
WBC: 10.5 10*3/uL (ref 4.0–10.5)
nRBC: 0 % (ref 0.0–0.2)

## 2019-09-13 LAB — URINALYSIS, ROUTINE W REFLEX MICROSCOPIC
Bilirubin Urine: NEGATIVE
Glucose, UA: >=500 mg/dL — AB
Ketones, ur: 5 mg/dL — AB
Nitrite: NEGATIVE
Protein, ur: >=300 mg/dL — AB
RBC / HPF: >50 RBC/hpf — ABNORMAL HIGH (ref 0–5)
Specific Gravity, Urine: 1.015 (ref 1.005–1.030)
WBC, UA: >50 WBC/hpf — ABNORMAL HIGH (ref 0–5)
pH: 5 (ref 5.0–8.0)

## 2019-09-13 LAB — SARS CORONAVIRUS 2 BY RT PCR (HOSPITAL ORDER, PERFORMED IN ~~LOC~~ HOSPITAL LAB): SARS Coronavirus 2: NEGATIVE

## 2019-09-13 LAB — TYPE AND SCREEN
ABO/RH(D): A POS
Antibody Screen: NEGATIVE

## 2019-09-13 LAB — BRAIN NATRIURETIC PEPTIDE: B Natriuretic Peptide: 589.4 pg/mL — ABNORMAL HIGH (ref 0.0–100.0)

## 2019-09-13 LAB — CBG MONITORING, ED
Glucose-Capillary: 524 mg/dL (ref 70–99)
Glucose-Capillary: 366 mg/dL — ABNORMAL HIGH (ref 70–99)

## 2019-09-13 LAB — PROTIME-INR
INR: 1.2 (ref 0.8–1.2)
Prothrombin Time: 14.4 seconds (ref 11.4–15.2)

## 2019-09-13 LAB — LACTIC ACID, PLASMA
Lactic Acid, Venous: 2.1 mmol/L (ref 0.5–1.9)
Lactic Acid, Venous: 1.2 mmol/L (ref 0.5–1.9)

## 2019-09-13 LAB — POC OCCULT BLOOD, ED: Fecal Occult Bld: NEGATIVE

## 2019-09-13 LAB — SEDIMENTATION RATE: Sed Rate: 140 mm/hr — ABNORMAL HIGH (ref 0–22)

## 2019-09-13 LAB — C-REACTIVE PROTEIN: CRP: 21.6 mg/dL — ABNORMAL HIGH (ref <1.0)

## 2019-09-13 MED ORDER — SODIUM CHLORIDE 0.9 % IV SOLN
1.0000 g | Freq: Once | INTRAVENOUS | Status: AC
  Administered 2019-09-13: 1 g via INTRAVENOUS
  Filled 2019-09-13: qty 10

## 2019-09-13 MED ORDER — INSULIN ASPART 100 UNIT/ML ~~LOC~~ SOLN
15.0000 [IU] | Freq: Once | SUBCUTANEOUS | Status: AC
  Administered 2019-09-13: 15 [IU] via SUBCUTANEOUS

## 2019-09-13 MED ORDER — HYDROCODONE-ACETAMINOPHEN 5-325 MG PO TABS
1.0000 | ORAL_TABLET | Freq: Once | ORAL | Status: AC
  Administered 2019-09-13: 1 via ORAL
  Filled 2019-09-13: qty 1

## 2019-09-13 NOTE — ED Notes (Signed)
Unable to draw blood for cultures. Lab will draw

## 2019-09-13 NOTE — ED Notes (Signed)
[facility] Line called @ 1312)-per Dr. Kathrynn Humble called by Levada Dy

## 2019-09-13 NOTE — ED Notes (Signed)
[facility] will come get pt after 8pm

## 2019-09-13 NOTE — ED Notes (Signed)
Unable to start IV because of poor access. Order placed for IV team . Pt reports having a mid -line 2 weeks ago.

## 2019-09-13 NOTE — ED Provider Notes (Addendum)
[facility] DEPARTMENT Provider Note   CSN: [id number] Arrival date & time: 09/13/19  1046     History No chief complaint on file.   Christine Sims is a 64 y.o. female.  HPI  64 y.o. female comes in via EMS with chief complaint of shortness of breath and wound evaluation.  Patient was assessed by home health team who advised her to come to the ER.  Patient reports that over the last 2 days she has had no appetite and nausea, therefore she has not eaten any food.  She has stopped taking insulin because of it.  She has noted that she is getting increasingly short of breath since she was discharged from outside hospital 3 weeks ago.  Patient's oxygen requirement has gone up from 2 to 3 L.  She has no new cough.  She is having some wheezing.  She thinks that her shortness of breath is because of CHF.  On a good day she is able to walk in her house with a walker, but she has not ambulated over the past 3 weeks since her discharge.  Additionally patient complains of worsening drainage in her right leg chronic wound.  She has no increased pain, fevers or chills.  Our review indicates that patient was admitted to [facility] with acute hypoxia. Patient with a long medical history including but not limited to COPD, CKD, CAD, diabetes mellitus, MI.  She had a cath during that admission and it revealed chronic coronary vessel disease.   Past Medical History:  Diagnosis Date  . Arthritis   . Asthma   . CHF (congestive heart failure) ([place])   . CKD (chronic kidney disease) stage 3, GFR 30-59 ml/min 02/25/2017  . COPD (chronic obstructive pulmonary disease) ([place])   . Depression 02/25/2017  . Diabetes mellitus without complication ([place])   . HTN (hypertension) 02/25/2017  . Hypercholesterolemia   . Hypertension   . MI (myocardial infarction) ([place])   . Shortness of breath dyspnea   . Stroke [facility])    R sided weakness    Patient Active Problem List    Diagnosis Date Noted  . Acute on chronic HFrEF (heart failure with reduced ejection fraction) ([place]) 06/26/2019  . Acute on chronic respiratory failure ([place])   . Uncontrolled type 2 diabetes mellitus with hyperglycemia ([place]) 05/09/2019  . Morbid obesity ([place]) 05/09/2019  . Acute exacerbation of CHF (congestive heart failure) ([place]) 05/05/2019  . COPD with acute exacerbation ([place]) 02/25/2017  . Rhinovirus infection 02/25/2017  . HTN (hypertension) 02/25/2017  . Chronic combined systolic and diastolic heart failure ([place]) 02/25/2017  . CKD (chronic kidney disease) stage 3, GFR 30-59 ml/min 02/25/2017  . Diabetes mellitus, insulin dependent (IDDM), controlled 02/25/2017  . Depression 02/25/2017  . COPD (chronic obstructive pulmonary disease) ([place]) 02/18/2017  . Community acquired pneumonia 04/04/2015    Past Surgical History:  Procedure Laterality Date  . CARDIAC SURGERY    . FOOT SURGERY     bilateral     OB History    Gravida      Para      Term      Preterm      AB      Living  2     SAB      TAB      Ectopic      Multiple      Live Births  Family History  Problem Relation Age of Onset  . Diabetes Other   . Hypertension Other     Social History   Tobacco Use  . Smoking status: Former Smoker    Types: Cigarettes  . Smokeless tobacco: Never Used  Vaping Use  . Vaping Use: Never used  Substance Use Topics  . Alcohol use: Yes  . Drug use: No    Home Medications Prior to Admission medications   Medication Sig Start Date End Date Taking? Authorizing Provider  acetaminophen (TYLENOL) 500 MG tablet Take 1,000 mg by mouth every 6 (six) hours as needed (for pain or headaches).     [provider]  albuterol (PROVENTIL HFA;VENTOLIN HFA) 108 (90 Base) MCG/ACT inhaler Inhale 2 puffs into the lungs every 4 (four) hours as needed for wheezing or shortness of breath. 04/15/15   Loletha Grayer, MD  albuterol (PROVENTIL) (2.5 MG/3ML) 0.083%  nebulizer solution Take 3 mLs (2.5 mg total) by nebulization every 4 (four) hours as needed for wheezing or shortness of breath. For wheezing. Use 3 times daily x 3 days then ecery 4 hours as needed. 02/26/17 12/16/24  Eugenie Filler, MD  amitriptyline (ELAVIL) 25 MG tablet Take 25 mg by mouth at bedtime. 04/10/19   [provider]  amLODipine (NORVASC) 10 MG tablet Take 10 mg by mouth daily.    [provider]  aspirin 81 MG EC tablet Take 81 mg by mouth daily.    [provider]  atorvastatin (LIPITOR) 80 MG tablet Take 80 mg by mouth daily.     [provider]  blood glucose meter kit and supplies Dispense based on patient and insurance preference. Use up to four times daily as directed. (FOR ICD-10 E10.9, E11.9). 02/26/17   Eugenie Filler, MD  budesonide (PULMICORT) 0.5 MG/2ML nebulizer solution Take 0.5 mg by nebulization every 12 (twelve) hours.    [provider]  budesonide-formoterol (SYMBICORT) 160-4.5 MCG/ACT inhaler Inhale 2 puffs into the lungs 2 (two) times daily. 04/15/15   Loletha Grayer, MD  busPIRone (BUSPAR) 15 MG tablet Take 15 mg by mouth 2 (two) times daily. 04/10/19   [provider]  clopidogrel (PLAVIX) 75 MG tablet Take 75 mg by mouth daily.    [provider]  doxycycline (VIBRA-TABS) 100 MG tablet Take 1 tablet (100 mg total) by mouth every 12 (twelve) hours. 07/04/19   Jeanmarie Hubert, MD  fluticasone (FLONASE) 50 MCG/ACT nasal spray Place 2 sprays into both nostrils daily. Patient taking differently: Place 2 sprays into both nostrils daily as needed for allergies or rhinitis.  02/27/17   Eugenie Filler, MD  furosemide (LASIX) 40 MG tablet Take 1.5 tablets (60 mg total) by mouth 2 (two) times daily. 07/04/19   Jeanmarie Hubert, MD  gabapentin (NEURONTIN) 300 MG capsule Take 300 mg by mouth at bedtime.     [provider]  insulin aspart (NOVOLOG FLEXPEN) 100 UNIT/ML FlexPen Inject 30 Units into  the skin 3 (three) times daily with meals.    [provider]  Insulin Glargine (LANTUS SOLOSTAR) 100 UNIT/ML Solostar Pen Inject 60 Units into the skin 2 (two) times daily.  12/30/18   [provider]  isosorbide mononitrate (IMDUR) 60 MG 24 hr tablet Take 1 tablet (60 mg total) by mouth daily. 07/05/19   Jeanmarie Hubert, MD  metoprolol succinate (TOPROL-XL) 50 MG 24 hr tablet Take 100 mg by mouth daily. Take with or immediately following a meal.    [provider]  montelukast (SINGULAIR) 4 MG chewable tablet Chew 4 mg by mouth every evening. 04/22/19 06/26/19  [provider]  nitroGLYCERIN (NITROSTAT) 0.4 MG SL tablet Place 1 tablet (0.4 mg total) under the tongue every 5 (five) minutes as needed for chest pain. 07/04/19   Jeanmarie Hubert, MD  nystatin (MYCOSTATIN/NYSTOP) powder Apply 1 application topically See admin instructions. Apply under the breasts 2 times a day 04/11/19   [provider]  OXYGEN Inhale 2 L/min into the lungs continuous.     [provider]  pantoprazole (PROTONIX) 20 MG tablet Take 20 mg by mouth daily before breakfast.  04/10/19   [provider]  spironolactone (ALDACTONE) 25 MG tablet Take 0.5 tablets (12.5 mg total) by mouth daily. 05/14/19   Mitzi Hansen, MD  tiZANidine (ZANAFLEX) 2 MG tablet Take 2 mg by mouth every evening. 03/11/19   [provider]  traZODone (DESYREL) 100 MG tablet Take 100 mg by mouth at bedtime.  04/11/19   [provider]    Allergies    Keppra [levetiracetam] and Lisinopril  Review of Systems   Review of Systems  Constitutional: Positive for activity change.  Respiratory: Positive for shortness of breath.   Cardiovascular: Negative for chest pain.  Gastrointestinal: Positive for nausea and vomiting.  Skin: Positive for wound.  Hematological: Does not bruise/bleed easily.  All other systems reviewed and are negative.   Physical Exam Updated Vital Signs BP  (!) 142/79 (BP Location: Right Arm)   Pulse 94   Temp 98.8 F (37.1 C) (Oral)   Resp 16   Ht '5\' 4"'$  (1.626 m)   Wt 108.9 kg   SpO2 100%   BMI 41.20 kg/m   Physical Exam Vitals and nursing note reviewed.  Constitutional:      Appearance: She is well-developed.  HENT:     Head: Normocephalic and atraumatic.  Eyes:     Extraocular Movements: Extraocular movements intact.     Pupils: Pupils are equal, round, and reactive to light.  Cardiovascular:     Rate and Rhythm: Tachycardia present.  Pulmonary:     Effort: Respiratory distress present.     Breath sounds: No wheezing.  Abdominal:     General: Bowel sounds are normal.     Tenderness: There is abdominal tenderness. There is no guarding or rebound.     Comments: Mild lower quadrant tenderness  Musculoskeletal:     Cervical back: Normal range of motion and neck supple.     Right lower leg: Edema present.     Left lower leg: Edema present.     Comments: Patient has pressure ulcers in right leg with drainage. Patient also has skin excoriation over the anogenital region.   Skin:    General: Skin is warm and dry.  Neurological:     Mental Status: She is alert and oriented to person, place, and time.             ED Results / Procedures / Treatments   Labs (all labs ordered are listed, but only abnormal results are displayed) Labs Reviewed  BLOOD CULTURE ID PANEL (REFLEXED) - Abnormal; Notable for the following components:      Result Value   Staphylococcus species DETECTED (*)    Staphylococcus aureus (BCID) DETECTED (*)    Methicillin resistance DETECTED (*)    All other components within normal limits  LACTIC ACID, PLASMA - Abnormal; Notable for the following components:   Lactic Acid, Venous 2.1 (*)  All other components within normal limits  COMPREHENSIVE METABOLIC PANEL - Abnormal; Notable for the following components:   Sodium 132 (*)    Chloride 91 (*)    Glucose, Bld 564 (*)    BUN 38 (*)     Creatinine, Ser 2.18 (*)    Albumin 2.6 (*)    AST 10 (*)    GFR calc non Af Amer 24 (*)    GFR calc Af Amer 27 (*)    Anion gap 17 (*)    All other components within normal limits  CBC WITH DIFFERENTIAL/PLATELET - Abnormal; Notable for the following components:   RBC 3.12 (*)    Hemoglobin 7.7 (*)    HCT 26.7 (*)    MCH 24.7 (*)    MCHC 28.8 (*)    RDW 17.7 (*)    Neutro Abs 8.6 (*)    All other components within normal limits  URINALYSIS, ROUTINE W REFLEX MICROSCOPIC - Abnormal; Notable for the following components:   APPearance CLOUDY (*)    Glucose, UA >=500 (*)    Hgb urine dipstick SMALL (*)    Ketones, ur 5 (*)    Protein, ur >=300 (*)    Leukocytes,Ua LARGE (*)    RBC / HPF >50 (*)    WBC, UA >50 (*)    Bacteria, UA MANY (*)    All other components within normal limits  SEDIMENTATION RATE - Abnormal; Notable for the following components:   Sed Rate 140 (*)    All other components within normal limits  C-REACTIVE PROTEIN - Abnormal; Notable for the following components:   CRP 21.6 (*)    All other components within normal limits  BRAIN NATRIURETIC PEPTIDE - Abnormal; Notable for the following components:   B Natriuretic Peptide 589.4 (*)    All other components within normal limits  CBG MONITORING, ED - Abnormal; Notable for the following components:   Glucose-Capillary 524 (*)    All other components within normal limits  CBG MONITORING, ED - Abnormal; Notable for the following components:   Glucose-Capillary 366 (*)    All other components within normal limits  SARS CORONAVIRUS 2 BY RT PCR (HOSPITAL ORDER, [facility] LAB)  CULTURE, BLOOD (ROUTINE X 2)  CULTURE, BLOOD (ROUTINE X 2)  LACTIC ACID, PLASMA  PROTIME-INR  POC OCCULT BLOOD, ED  TYPE AND SCREEN    EKG EKG Interpretation  Date/Time:  Saturday September 13 2019 12:07:57 EDT Ventricular Rate:  97 PR Interval:    QRS Duration: 96 QT Interval:  366 QTC Calculation: 465 R  Axis:   12 Text Interpretation: Sinus rhythm Ventricular premature complex Aberrant complex Repol abnrm suggests ischemia, lateral leads Minimal ST elevation, anterior leads No acute changes No significant change since last tracing Confirmed by Varney Biles ([id number]) on 09/13/2019 12:51:19 PM   Radiology DG Chest Port 1 View  Result Date: 09/13/2019 CLINICAL DATA:  Fatigue and nausea. EXAM: PORTABLE CHEST 1 VIEW COMPARISON:  08/20/2019 and prior radiographs FINDINGS: Cardiomegaly again noted. Mild pulmonary vascular congestion is present. There is no evidence of focal airspace disease, pulmonary edema, suspicious pulmonary nodule/mass, pleural effusion, or pneumothorax. No acute bony abnormalities are identified. IMPRESSION: Cardiomegaly with mild pulmonary vascular congestion. Electronically Signed   By: Margarette Canada M.D.   On: 09/13/2019 12:12    Procedures .Critical Care Performed by: Varney Biles, MD Authorized by: Varney Biles, MD   Critical care provider statement:    Critical care time (minutes):  125   Critical care was necessary to treat or prevent imminent or life-threatening deterioration of the following conditions:  Respiratory failure and circulatory failure   Critical care was time spent personally by me on the following activities:  Discussions with consultants, evaluation of patient's response to treatment, examination of patient, ordering and performing treatments and interventions, ordering and review of laboratory studies, ordering and review of radiographic studies, pulse oximetry, re-evaluation of patient's condition, obtaining history from patient or surrogate and review of old charts   (including critical care time)  Medications Ordered in ED Medications  HYDROcodone-acetaminophen (NORCO/VICODIN) 5-325 MG per tablet 1 tablet (1 tablet Oral Given 09/13/19 1141)  insulin aspart (novoLOG) injection 15 Units (15 Units Subcutaneous Given 09/13/19 1358)  cefTRIAXone  (ROCEPHIN) 1 g in sodium chloride 0.9 % 100 mL IVPB (0 g Intravenous Stopped 09/13/19 1537)    ED Course  I have reviewed the triage vital signs and the nursing notes.  Pertinent labs & imaging results that were available during my care of the patient were reviewed by me and considered in my medical decision making (see chart for details).  Clinical Course as of Sep 13 2056  Sat Sep 13, 2019  1318 Elevated glucose with mild anion gap.  Bicarb is 24.  Glucose(!!): 564 [AN]  1319 Hemoglobin has dropped 3 g, Hemoccult negative but patient did have dark stools.  Hemoglobin(!): 7.7 [AN]  1321 We will not be transfusing her immediately.  Patient will likely benefit with admission at [facility] where she has gotten all of her care.  We will consult [facility] to see if they will accept her for admission.  Transfusion should ideally be completed by the service that will be accepting her.  Hemoglobin(!): 7.7 [AN]  1322 Urine shows signs of infection. We will give her IV ceftriaxone.  Clinically, my suspicion is that the underlying process is not sepsis but CHF and acute anemia.  Her lactate is already cleared without any intervention.  Other possibility of infection includes deep space infection of the foot -however still underlying process does not appear to be sepsis.  Urinalysis, Routine w reflex microscopic(!) [AN]  1410 We received a call from [facility].  They will call me back to see if there is a bed possibly available for her pending discharge from today.   [AN]  1430 Dr. Marcelene Butte at [facility] accepting.  They will call us when a bed is available and also see if they can transport the patient.   [AN]    Clinical Course User Index [AN] Varney Biles, MD   MDM Rules/Calculators/A&P                          64 year old comes in a chief complaint of shortness of breath and worsening wound.  She typically gets her care at [facility] and was just  discharged from the hospital 3 weeks ago.  Prior to her admission she was able to walk in her house with support and oxygen.  She is chronically on 2 L of oxygen for her COPD.  After being discharged 3 weeks ago she has not been able to walk.  She gets wound care to come and check on her and they noted that she was having increased shortness of breath and worsening wound.  On exam patient is noted to have acute on chronic respiratory failure.  She is currently on 4 L of  oxygen.  Typically she is on 2 to 3 L.  Lung exam was reassuring however it was limited given the body habitus.  There is pitting edema appreciated over the legs.  Patient has a draining wound to the right lower extremity.  No signs of cellulitis around it.  She also has pressure ulcers over the sacrum.  There could be superimposed fungal infection, she will need barrier cream to be applied over her anogenital region.  Currently the thing that patient likely has acute on chronic CHF.  May be there is a component of COPD involved as well.  We have also considered PE in the differential diagnosis, but it is not as high as CHF, COPD.  Given the recent negative cath, ACS is high on the differential.  EKG is not showing any acute findings.  The wound itself does not look infected right now.  Patient will need admission to the hospital.    Final Clinical Impression(s) / ED Diagnoses Final diagnoses:  Acute on chronic respiratory failure with hypoxia ([place])  Pressure injury of skin of sacral region, unspecified injury stage    Rx / DC Orders ED Discharge Orders    None       Varney Biles, MD 09/13/19 [place], Kadien Lineman, MD 09/14/19 2059

## 2019-09-13 NOTE — ED Triage Notes (Signed)
Patient arrived by [facility] complaining of fatigue with nausea and decreased appetite x 2 days. No insulin x 2 days because she hasn't eaten. Arrived on 4l of oxygen, normally 2L at home. BS 537. Wounds to right foot/ankle that are draining and to buttocks with drainage x 3 weeks. Patient appears SOB, tachypneic. Alert and oriented. Patient pale and requesting SW for possible nursing home placement

## 2019-09-14 LAB — BLOOD CULTURE ID PANEL (REFLEXED)

## 2019-09-16 LAB — CULTURE, BLOOD (ROUTINE X 2): Special Requests: ADEQUATE

## 2019-09-18 LAB — CULTURE, BLOOD (ROUTINE X 2): Culture: NO GROWTH

## 2020-02-04 DEATH — deceased

## 2021-09-27 IMAGING — DX DG CHEST 1V PORT
1 series · 1 of 1 positions shown · non-contrast
Comparison: 04/13/2019

CLINICAL DATA: Shortness of breath

EXAM:
PORTABLE CHEST 1 VIEW

[chest]
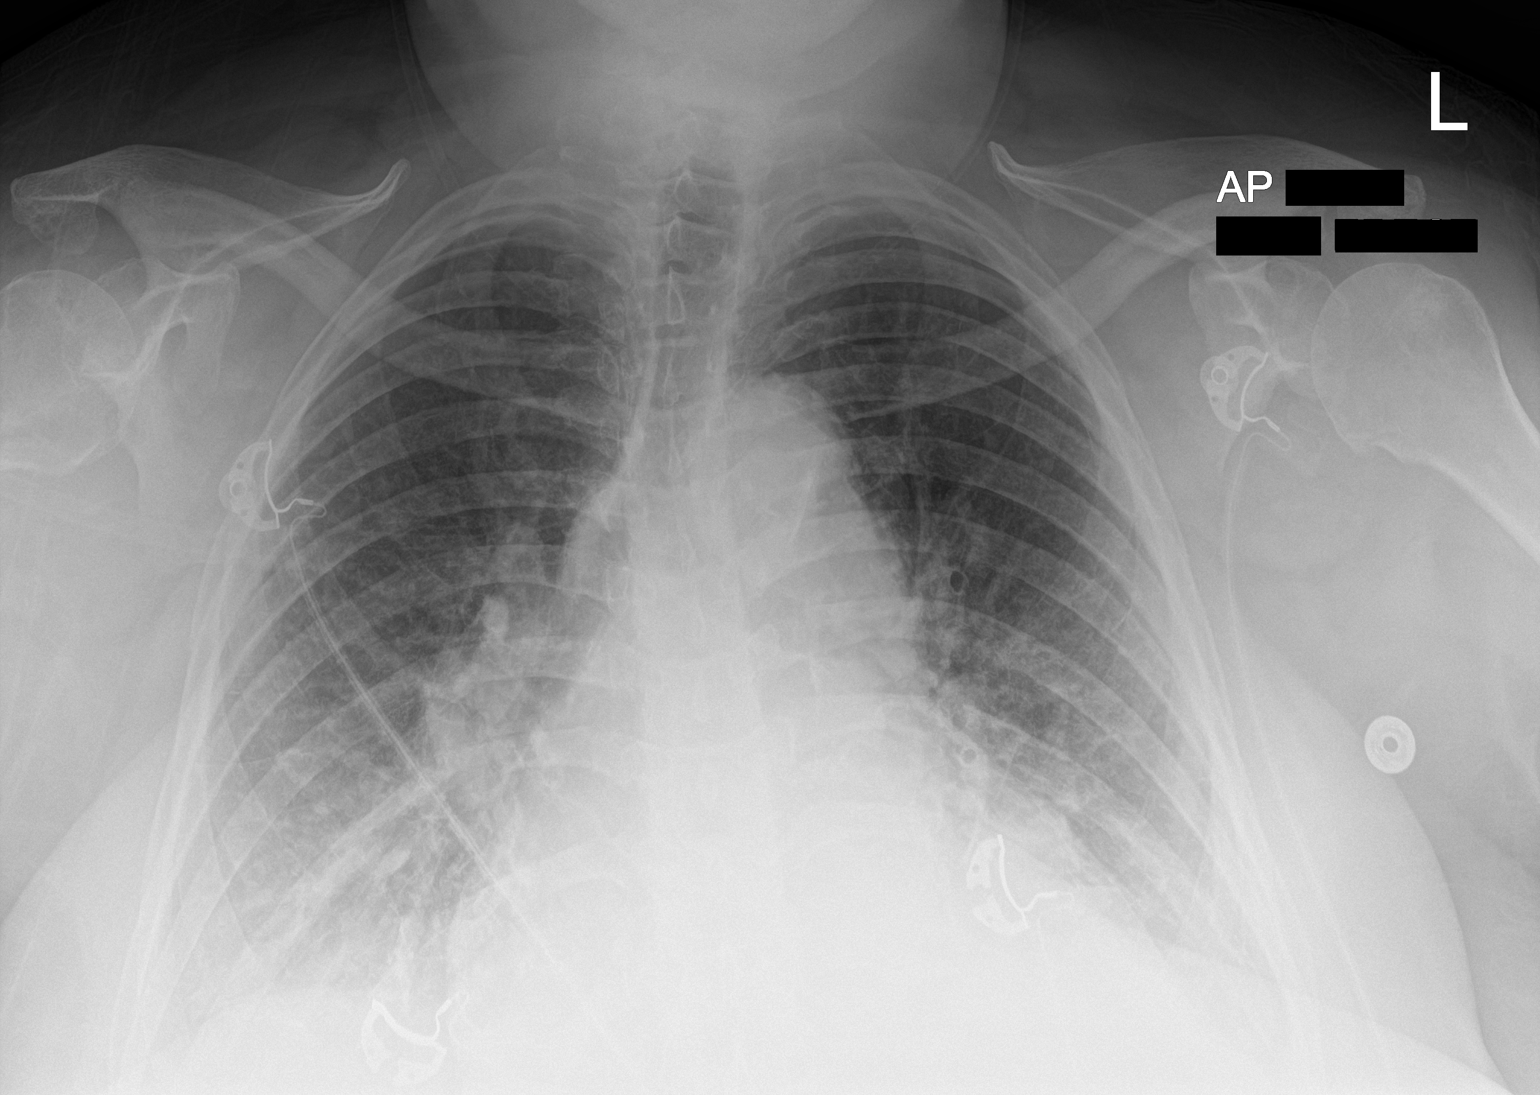

[1 of 1 positions shown; findings below may reference images not displayed]

FINDINGS: Heart size is moderately enlarged, unchanged. Calcific aortic knob.
Persistent streaky bibasilar airspace opacities. No large pleural
fluid collection. No pneumothorax.
IMPRESSION: Persistent streaky bibasilar airspace opacities, which may reflect
atelectasis versus pneumonia. Stable cardiomegaly.

## 2022-02-05 IMAGING — DX DG CHEST 1V PORT
1 series · 1 of 1 positions shown · non-contrast
Comparison: 08/20/2019 and prior radiographs

CLINICAL DATA: Fatigue and nausea.

EXAM:
PORTABLE CHEST 1 VIEW

[chest]
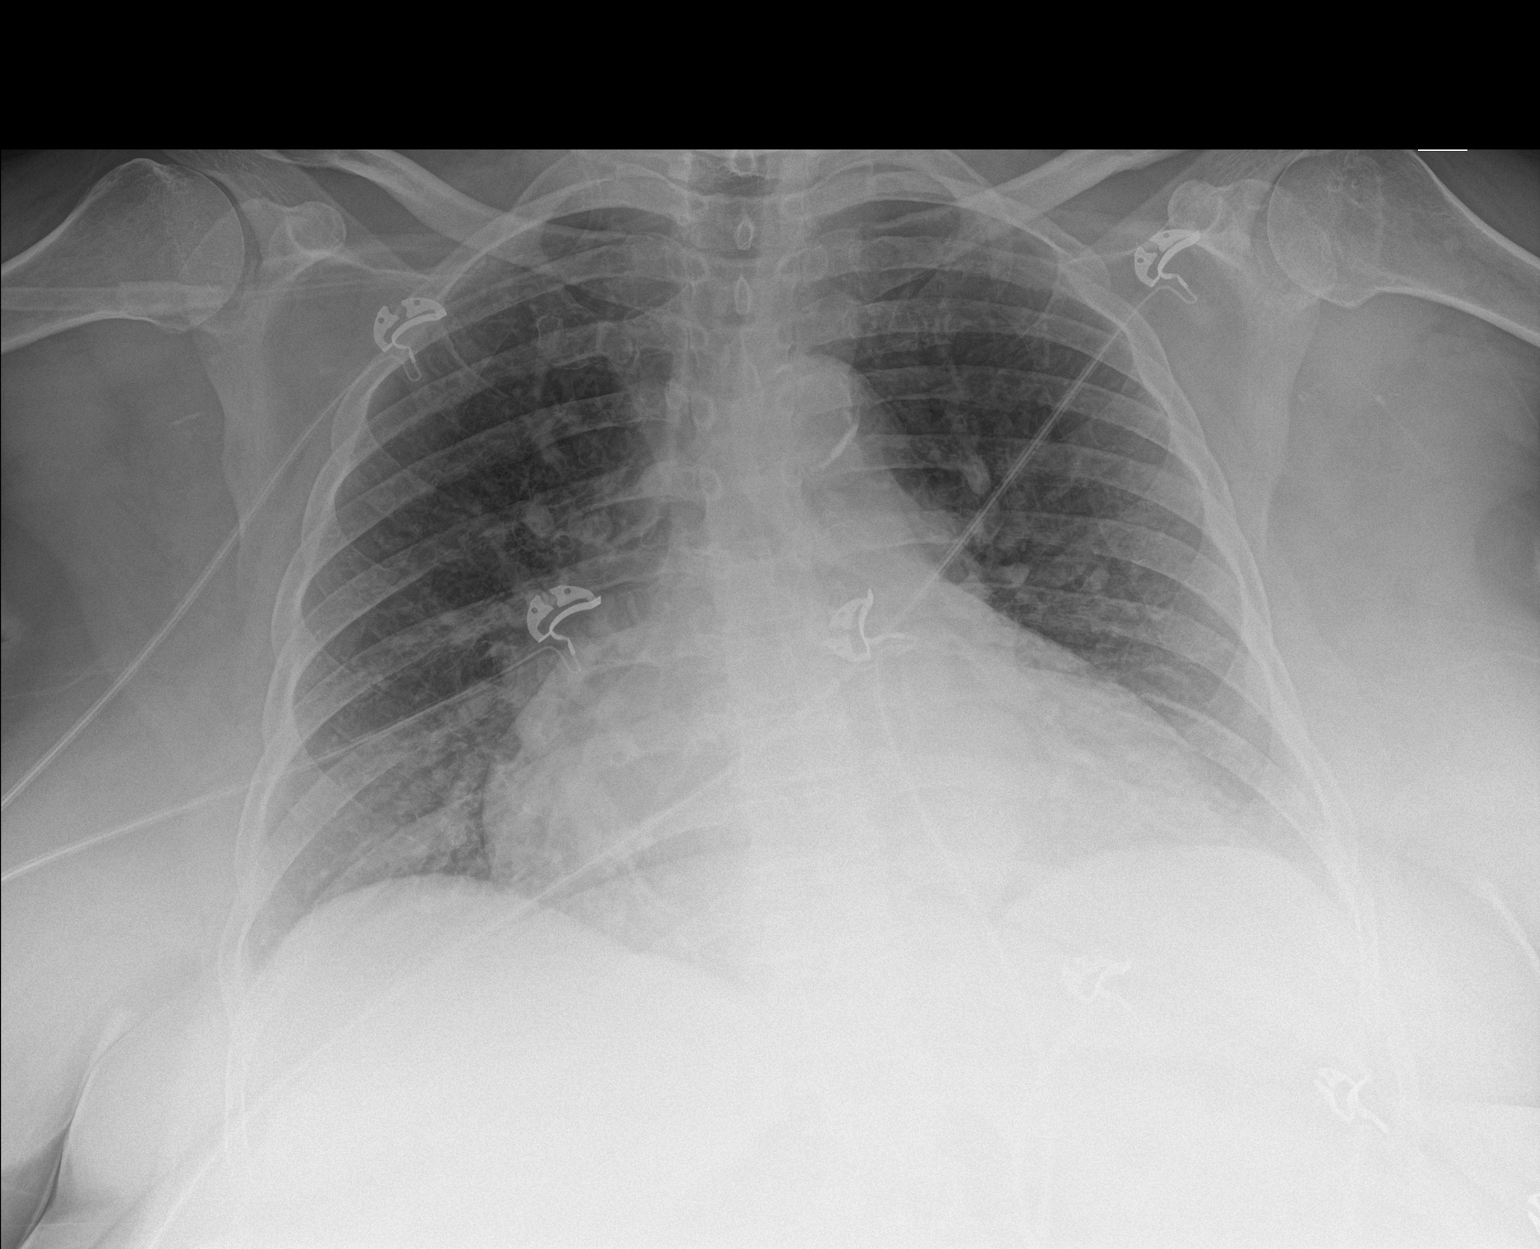

[1 of 1 positions shown; findings below may reference images not displayed]

FINDINGS: Cardiomegaly again noted.

Mild pulmonary vascular congestion is present.

There is no evidence of focal airspace disease, pulmonary edema,
suspicious pulmonary nodule/mass, pleural effusion, or pneumothorax.

No acute bony abnormalities are identified.
IMPRESSION: Cardiomegaly with mild pulmonary vascular congestion.
